# Patient Record
Sex: Female | Born: 1950 | Race: Black or African American | Hispanic: No | Marital: Married | Smoking: Former smoker
Health system: Southern US, Community
[De-identification: ages and names within clinical notes are randomized; demographics above are authoritative.]

## PROBLEM LIST (undated history)

## (undated) DIAGNOSIS — R110 Nausea: Secondary | ICD-10-CM

## (undated) DIAGNOSIS — G629 Polyneuropathy, unspecified: Secondary | ICD-10-CM

## (undated) DIAGNOSIS — K449 Diaphragmatic hernia without obstruction or gangrene: Secondary | ICD-10-CM

## (undated) DIAGNOSIS — C50419 Malignant neoplasm of upper-outer quadrant of unspecified female breast: Secondary | ICD-10-CM

## (undated) DIAGNOSIS — I341 Nonrheumatic mitral (valve) prolapse: Secondary | ICD-10-CM

## (undated) DIAGNOSIS — M5412 Radiculopathy, cervical region: Secondary | ICD-10-CM

## (undated) DIAGNOSIS — C801 Malignant (primary) neoplasm, unspecified: Secondary | ICD-10-CM

## (undated) DIAGNOSIS — E7800 Pure hypercholesterolemia, unspecified: Secondary | ICD-10-CM

## (undated) DIAGNOSIS — E785 Hyperlipidemia, unspecified: Secondary | ICD-10-CM

## (undated) DIAGNOSIS — I10 Essential (primary) hypertension: Secondary | ICD-10-CM

## (undated) DIAGNOSIS — T451X5A Adverse effect of antineoplastic and immunosuppressive drugs, initial encounter: Secondary | ICD-10-CM

## (undated) DIAGNOSIS — J45909 Unspecified asthma, uncomplicated: Secondary | ICD-10-CM

## (undated) DIAGNOSIS — K589 Irritable bowel syndrome without diarrhea: Secondary | ICD-10-CM

## (undated) DIAGNOSIS — R630 Anorexia: Secondary | ICD-10-CM

## (undated) DIAGNOSIS — Z923 Personal history of irradiation: Secondary | ICD-10-CM

## (undated) DIAGNOSIS — Z853 Personal history of malignant neoplasm of breast: Secondary | ICD-10-CM

## (undated) DIAGNOSIS — E119 Type 2 diabetes mellitus without complications: Secondary | ICD-10-CM

## (undated) HISTORY — DX: Malignant (primary) neoplasm, unspecified: C80.1

## (undated) HISTORY — DX: Essential (primary) hypertension: I10

## (undated) HISTORY — DX: Malignant neoplasm of upper-outer quadrant of unspecified female breast: C50.419

## (undated) HISTORY — DX: Diaphragmatic hernia without obstruction or gangrene: K44.9

## (undated) HISTORY — DX: Nausea: R11.0

## (undated) HISTORY — DX: Anorexia: R63.0

## (undated) HISTORY — DX: Nausea: T45.1X5A

## (undated) HISTORY — DX: Personal history of malignant neoplasm of breast: Z85.3

## (undated) HISTORY — PX: TUBAL LIGATION: SHX77

## (undated) HISTORY — DX: Type 2 diabetes mellitus without complications: E11.9

## (undated) HISTORY — DX: Irritable bowel syndrome, unspecified: K58.9

## (undated) MED FILL — Dexamethasone Sodium Phosphate Inj 100 MG/10ML: INTRAMUSCULAR | Qty: 1 | Status: AC

---

## 1998-12-20 HISTORY — PX: ABDOMINAL EXPLORATION SURGERY: SHX538

## 2005-01-25 ENCOUNTER — Ambulatory Visit: Payer: Self-pay | Admitting: Family Medicine

## 2005-04-18 ENCOUNTER — Emergency Department: Payer: Self-pay | Admitting: Emergency Medicine

## 2005-08-04 ENCOUNTER — Ambulatory Visit: Payer: Self-pay | Admitting: Family Medicine

## 2005-08-31 ENCOUNTER — Emergency Department: Payer: Self-pay | Admitting: Emergency Medicine

## 2005-09-06 ENCOUNTER — Emergency Department: Payer: Self-pay | Admitting: Emergency Medicine

## 2006-06-14 ENCOUNTER — Ambulatory Visit: Payer: Self-pay | Admitting: Family Medicine

## 2007-04-25 ENCOUNTER — Ambulatory Visit: Payer: Self-pay | Admitting: Family Medicine

## 2007-05-22 ENCOUNTER — Ambulatory Visit: Payer: Self-pay | Admitting: Family Medicine

## 2007-06-21 ENCOUNTER — Ambulatory Visit: Payer: Self-pay | Admitting: Family Medicine

## 2007-08-28 ENCOUNTER — Emergency Department: Payer: Self-pay | Admitting: Emergency Medicine

## 2008-09-09 ENCOUNTER — Ambulatory Visit: Payer: Self-pay | Admitting: Family Medicine

## 2008-09-13 ENCOUNTER — Ambulatory Visit: Payer: Self-pay | Admitting: Family Medicine

## 2008-12-20 DIAGNOSIS — C801 Malignant (primary) neoplasm, unspecified: Secondary | ICD-10-CM

## 2008-12-20 DIAGNOSIS — Z853 Personal history of malignant neoplasm of breast: Secondary | ICD-10-CM

## 2008-12-20 DIAGNOSIS — Z923 Personal history of irradiation: Secondary | ICD-10-CM

## 2008-12-20 HISTORY — DX: Malignant (primary) neoplasm, unspecified: C80.1

## 2008-12-20 HISTORY — PX: BREAST LUMPECTOMY: SHX2

## 2008-12-20 HISTORY — DX: Personal history of malignant neoplasm of breast: Z85.3

## 2008-12-20 HISTORY — PX: MASTECTOMY: SHX3

## 2008-12-20 HISTORY — PX: BREAST BIOPSY: SHX20

## 2008-12-20 HISTORY — DX: Personal history of irradiation: Z92.3

## 2008-12-20 HISTORY — PX: BREAST EXCISIONAL BIOPSY: SUR124

## 2009-03-07 ENCOUNTER — Ambulatory Visit: Payer: Self-pay | Admitting: General Surgery

## 2009-03-23 DIAGNOSIS — Z853 Personal history of malignant neoplasm of breast: Secondary | ICD-10-CM | POA: Insufficient documentation

## 2009-04-04 ENCOUNTER — Ambulatory Visit: Payer: Self-pay | Admitting: General Surgery

## 2009-04-11 ENCOUNTER — Ambulatory Visit: Payer: Self-pay | Admitting: General Surgery

## 2009-04-11 DIAGNOSIS — C50419 Malignant neoplasm of upper-outer quadrant of unspecified female breast: Secondary | ICD-10-CM

## 2009-04-11 HISTORY — DX: Malignant neoplasm of upper-outer quadrant of unspecified female breast: C50.419

## 2009-04-19 ENCOUNTER — Ambulatory Visit: Payer: Self-pay | Admitting: Internal Medicine

## 2009-04-25 ENCOUNTER — Ambulatory Visit: Payer: Self-pay | Admitting: Internal Medicine

## 2009-05-20 ENCOUNTER — Ambulatory Visit: Payer: Self-pay | Admitting: Internal Medicine

## 2009-06-19 ENCOUNTER — Ambulatory Visit: Payer: Self-pay | Admitting: Internal Medicine

## 2009-07-20 ENCOUNTER — Ambulatory Visit: Payer: Self-pay | Admitting: Internal Medicine

## 2009-07-22 ENCOUNTER — Ambulatory Visit: Payer: Self-pay | Admitting: Internal Medicine

## 2009-08-20 ENCOUNTER — Ambulatory Visit: Payer: Self-pay | Admitting: Internal Medicine

## 2009-09-23 ENCOUNTER — Ambulatory Visit: Payer: Self-pay | Admitting: General Surgery

## 2009-10-20 ENCOUNTER — Ambulatory Visit: Payer: Self-pay | Admitting: Internal Medicine

## 2009-11-07 ENCOUNTER — Ambulatory Visit: Payer: Self-pay | Admitting: Internal Medicine

## 2009-11-19 ENCOUNTER — Ambulatory Visit: Payer: Self-pay | Admitting: Internal Medicine

## 2009-12-20 DIAGNOSIS — E119 Type 2 diabetes mellitus without complications: Secondary | ICD-10-CM

## 2009-12-20 HISTORY — DX: Type 2 diabetes mellitus without complications: E11.9

## 2010-01-20 ENCOUNTER — Ambulatory Visit: Payer: Self-pay | Admitting: Internal Medicine

## 2010-02-13 ENCOUNTER — Ambulatory Visit: Payer: Self-pay | Admitting: Internal Medicine

## 2010-02-17 ENCOUNTER — Ambulatory Visit: Payer: Self-pay | Admitting: Internal Medicine

## 2010-03-25 ENCOUNTER — Ambulatory Visit: Payer: Self-pay | Admitting: General Surgery

## 2010-04-19 ENCOUNTER — Ambulatory Visit: Payer: Self-pay | Admitting: Internal Medicine

## 2010-05-14 ENCOUNTER — Ambulatory Visit: Payer: Self-pay | Admitting: Internal Medicine

## 2010-05-20 ENCOUNTER — Ambulatory Visit: Payer: Self-pay | Admitting: Internal Medicine

## 2010-06-11 ENCOUNTER — Ambulatory Visit: Payer: Self-pay | Admitting: Internal Medicine

## 2010-06-19 ENCOUNTER — Ambulatory Visit: Payer: Self-pay | Admitting: Internal Medicine

## 2010-09-24 ENCOUNTER — Ambulatory Visit: Payer: Self-pay | Admitting: General Surgery

## 2010-10-13 ENCOUNTER — Ambulatory Visit: Payer: Self-pay | Admitting: Internal Medicine

## 2010-10-14 LAB — CANCER ANTIGEN 27.29: CA 27.29: 18.8 U/mL (ref 0.0–38.6)

## 2010-10-20 ENCOUNTER — Ambulatory Visit: Payer: Self-pay | Admitting: Internal Medicine

## 2010-11-19 ENCOUNTER — Ambulatory Visit: Payer: Self-pay | Admitting: Family Medicine

## 2011-02-16 ENCOUNTER — Ambulatory Visit: Payer: Self-pay | Admitting: Internal Medicine

## 2011-02-17 LAB — CANCER ANTIGEN 27.29: CA 27.29: 26.6 U/mL (ref 0.0–38.6)

## 2011-02-18 ENCOUNTER — Ambulatory Visit: Payer: Self-pay | Admitting: Cardiovascular Disease

## 2011-03-24 ENCOUNTER — Ambulatory Visit: Payer: Self-pay | Admitting: General Surgery

## 2011-05-31 ENCOUNTER — Ambulatory Visit: Payer: Self-pay | Admitting: Physical Medicine and Rehabilitation

## 2011-07-15 ENCOUNTER — Ambulatory Visit: Payer: Self-pay | Admitting: Internal Medicine

## 2011-07-21 ENCOUNTER — Ambulatory Visit: Payer: Self-pay | Admitting: Internal Medicine

## 2011-08-24 ENCOUNTER — Ambulatory Visit: Payer: Self-pay | Admitting: Internal Medicine

## 2011-08-25 LAB — CANCER ANTIGEN 27.29: CA 27.29: 19.5 U/mL (ref 0.0–38.6)

## 2011-09-20 ENCOUNTER — Ambulatory Visit: Payer: Self-pay | Admitting: Internal Medicine

## 2011-11-17 ENCOUNTER — Ambulatory Visit: Payer: Self-pay | Admitting: General Surgery

## 2012-02-22 ENCOUNTER — Ambulatory Visit: Payer: Self-pay | Admitting: Oncology

## 2012-02-22 LAB — COMPREHENSIVE METABOLIC PANEL
Alkaline Phosphatase: 132 U/L (ref 50–136)
Anion Gap: 10 (ref 7–16)
Bilirubin,Total: 0.2 mg/dL (ref 0.2–1.0)
Calcium, Total: 8.7 mg/dL (ref 8.5–10.1)
Chloride: 105 mmol/L (ref 98–107)
Co2: 29 mmol/L (ref 21–32)
Creatinine: 1.18 mg/dL (ref 0.60–1.30)
EGFR (Non-African Amer.): 50 — ABNORMAL LOW
Osmolality: 287 (ref 275–301)
SGPT (ALT): 27 U/L
Sodium: 144 mmol/L (ref 136–145)
Total Protein: 7.6 g/dL (ref 6.4–8.2)

## 2012-02-22 LAB — CBC CANCER CENTER
Basophil #: 0 x10 3/mm (ref 0.0–0.1)
Eosinophil %: 2.7 %
HGB: 10.7 g/dL — ABNORMAL LOW (ref 12.0–16.0)
Lymphocyte #: 1.9 x10 3/mm (ref 1.0–3.6)
Lymphocyte %: 25.4 %
MCH: 26.9 pg (ref 26.0–34.0)
MCV: 82 fL (ref 80–100)
Monocyte %: 5.3 %
Neutrophil %: 66.1 %
Platelet: 321 x10 3/mm (ref 150–440)
RBC: 3.96 10*6/uL (ref 3.80–5.20)
RDW: 15.6 % — ABNORMAL HIGH (ref 11.5–14.5)
WBC: 7.4 x10 3/mm (ref 3.6–11.0)

## 2012-03-20 ENCOUNTER — Ambulatory Visit: Payer: Self-pay | Admitting: Oncology

## 2012-06-15 ENCOUNTER — Ambulatory Visit: Payer: Self-pay | Admitting: General Surgery

## 2012-06-19 LAB — HM COLONOSCOPY: HM Colonoscopy: NORMAL

## 2012-07-19 ENCOUNTER — Ambulatory Visit: Payer: Self-pay | Admitting: General Surgery

## 2012-07-19 HISTORY — PX: COLONOSCOPY: SHX174

## 2012-07-20 ENCOUNTER — Ambulatory Visit: Payer: Self-pay | Admitting: Oncology

## 2012-08-20 ENCOUNTER — Ambulatory Visit: Payer: Self-pay | Admitting: Oncology

## 2012-08-31 LAB — CBC CANCER CENTER
Basophil #: 0 x10 3/mm (ref 0.0–0.1)
Eosinophil #: 0.1 x10 3/mm (ref 0.0–0.7)
HGB: 11.5 g/dL — ABNORMAL LOW (ref 12.0–16.0)
Lymphocyte %: 28.4 %
MCH: 26.5 pg (ref 26.0–34.0)
MCHC: 31.1 g/dL — ABNORMAL LOW (ref 32.0–36.0)
MCV: 85 fL (ref 80–100)
Monocyte #: 0.3 x10 3/mm (ref 0.2–0.9)
Neutrophil #: 3.5 x10 3/mm (ref 1.4–6.5)
Neutrophil %: 63.4 %
Platelet: 227 x10 3/mm (ref 150–440)
RBC: 4.35 10*6/uL (ref 3.80–5.20)
RDW: 15.3 % — ABNORMAL HIGH (ref 11.5–14.5)

## 2012-08-31 LAB — COMPREHENSIVE METABOLIC PANEL
Albumin: 3.1 g/dL — ABNORMAL LOW (ref 3.4–5.0)
Alkaline Phosphatase: 129 U/L (ref 50–136)
Anion Gap: 3 — ABNORMAL LOW (ref 7–16)
Bilirubin,Total: 0.2 mg/dL (ref 0.2–1.0)
Calcium, Total: 8.8 mg/dL (ref 8.5–10.1)
Co2: 34 mmol/L — ABNORMAL HIGH (ref 21–32)
Creatinine: 0.96 mg/dL (ref 0.60–1.30)
EGFR (African American): >60
Glucose: 107 mg/dL — ABNORMAL HIGH (ref 65–99)
Osmolality: 286 (ref 275–301)
SGPT (ALT): 30 U/L (ref 12–78)

## 2012-09-19 ENCOUNTER — Ambulatory Visit: Payer: Self-pay | Admitting: Oncology

## 2012-11-08 ENCOUNTER — Ambulatory Visit: Payer: Self-pay | Admitting: Oncology

## 2012-12-05 ENCOUNTER — Ambulatory Visit: Payer: Self-pay | Admitting: Oncology

## 2013-02-17 ENCOUNTER — Ambulatory Visit: Payer: Self-pay | Admitting: Oncology

## 2013-03-13 LAB — CBC CANCER CENTER
Basophil #: 0.1 x10 3/mm (ref 0.0–0.1)
Basophil %: 1.3 %
Eosinophil #: 0.2 x10 3/mm (ref 0.0–0.7)
HCT: 37.7 % (ref 35.0–47.0)
Lymphocyte #: 1.5 x10 3/mm (ref 1.0–3.6)
MCV: 84 fL (ref 80–100)
Monocyte #: 0.4 x10 3/mm (ref 0.2–0.9)
Monocyte %: 6.7 %
RBC: 4.46 10*6/uL (ref 3.80–5.20)
RDW: 14.1 % (ref 11.5–14.5)
WBC: 5.7 x10 3/mm (ref 3.6–11.0)

## 2013-03-13 LAB — COMPREHENSIVE METABOLIC PANEL
Albumin: 3.1 g/dL — ABNORMAL LOW (ref 3.4–5.0)
Alkaline Phosphatase: 108 U/L (ref 50–136)
BUN: 15 mg/dL (ref 7–18)
Bilirubin,Total: 0.2 mg/dL (ref 0.2–1.0)
Calcium, Total: 8.8 mg/dL (ref 8.5–10.1)
Co2: 30 mmol/L (ref 21–32)
EGFR (African American): 58 — ABNORMAL LOW
EGFR (Non-African Amer.): 50 — ABNORMAL LOW
Osmolality: 288 (ref 275–301)
SGOT(AST): 19 U/L (ref 15–37)
SGPT (ALT): 22 U/L (ref 12–78)
Sodium: 143 mmol/L (ref 136–145)
Total Protein: 7.1 g/dL (ref 6.4–8.2)

## 2013-03-14 LAB — CANCER ANTIGEN 27.29: CA 27.29: 20.4 U/mL (ref 0.0–38.6)

## 2013-03-20 ENCOUNTER — Ambulatory Visit: Payer: Self-pay | Admitting: Oncology

## 2013-06-06 ENCOUNTER — Ambulatory Visit: Payer: Self-pay | Admitting: Family Medicine

## 2013-06-11 ENCOUNTER — Encounter: Payer: Self-pay | Admitting: Family Medicine

## 2013-06-19 ENCOUNTER — Encounter: Payer: Self-pay | Admitting: Family Medicine

## 2013-07-11 ENCOUNTER — Ambulatory Visit: Payer: Self-pay | Admitting: Oncology

## 2013-07-20 ENCOUNTER — Ambulatory Visit: Payer: Self-pay | Admitting: Oncology

## 2013-08-20 LAB — HM MAMMOGRAPHY: HM MAMMO: NORMAL (ref 0–4)

## 2013-09-12 ENCOUNTER — Ambulatory Visit: Payer: Self-pay | Admitting: Oncology

## 2013-09-13 LAB — CBC CANCER CENTER
Eosinophil #: 0.2 x10 3/mm (ref 0.0–0.7)
Eosinophil %: 3.2 %
HCT: 38.9 % (ref 35.0–47.0)
HGB: 12.7 g/dL (ref 12.0–16.0)
Lymphocyte %: 23.1 %
Monocyte %: 5.9 %
Neutrophil #: 4.2 x10 3/mm (ref 1.4–6.5)
Neutrophil %: 67 %
Platelet: 236 x10 3/mm (ref 150–440)
RDW: 14.5 % (ref 11.5–14.5)

## 2013-09-13 LAB — COMPREHENSIVE METABOLIC PANEL
Alkaline Phosphatase: 112 U/L (ref 50–136)
Anion Gap: 1 — ABNORMAL LOW (ref 7–16)
BUN: 10 mg/dL (ref 7–18)
Bilirubin,Total: 0.3 mg/dL (ref 0.2–1.0)
Calcium, Total: 9.1 mg/dL (ref 8.5–10.1)
Chloride: 106 mmol/L (ref 98–107)
Creatinine: 0.91 mg/dL (ref 0.60–1.30)
EGFR (African American): >60
EGFR (Non-African Amer.): >60
Osmolality: 281 (ref 275–301)
Sodium: 140 mmol/L (ref 136–145)

## 2013-09-19 ENCOUNTER — Ambulatory Visit: Payer: Self-pay | Admitting: Oncology

## 2013-12-19 ENCOUNTER — Ambulatory Visit: Payer: Self-pay | Admitting: General Surgery

## 2013-12-24 ENCOUNTER — Encounter: Payer: Self-pay | Admitting: General Surgery

## 2014-01-07 ENCOUNTER — Encounter: Payer: Self-pay | Admitting: General Surgery

## 2014-01-07 ENCOUNTER — Ambulatory Visit (INDEPENDENT_AMBULATORY_CARE_PROVIDER_SITE_OTHER): Payer: 59 | Admitting: General Surgery

## 2014-01-07 VITALS — BP 120/90 | HR 66 | Resp 14 | Ht 66.0 in | Wt 213.0 lb

## 2014-01-07 DIAGNOSIS — Z1239 Encounter for other screening for malignant neoplasm of breast: Secondary | ICD-10-CM

## 2014-01-07 DIAGNOSIS — C50919 Malignant neoplasm of unspecified site of unspecified female breast: Secondary | ICD-10-CM

## 2014-01-07 NOTE — Progress Notes (Signed)
Patient ID: Morgan Peterson, female   DOB: 1951/10/31, 63 y.o.   MRN: [id number]  Chief Complaint  Patient presents with  . Follow-up    mammogram    HPI Morgan Peterson is a 63 y.o. female who presents for Reassessment of her previously treated right breast cancer The most recent mammogram was done on 12/19/13. Patient does perform regular self breast checks and gets regular mammograms done. The patient denies any problems at this time.   HPI  Past Medical History  Diagnosis Date  . Diabetes mellitus without complication 9476  . Hypertension   . Personal history of malignant neoplasm of breast 2010  . IBS (irritable bowel syndrome)   . Cancer 2010    Right Breast  . 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 Abdominal exploration surgery  2000  . Colonoscopy  2003, 2013    Dr Alveta Heimlich, Dr. Bary Castilla  . Tubal ligation  20 years ago  . Breast lumpectomy Right 2010  . Breast biopsy Right 2010    Family History  Problem Relation Age of Onset  . Colon cancer Maternal Uncle   . Breast cancer Maternal Aunt     Social History History  Substance Use Topics  . Smoking status: Former Smoker -- 0.25 packs/day for 10 years    Types: Cigarettes  . Smokeless tobacco: Never Used  . Alcohol Use: Yes    Allergies  Allergen Reactions  . Codeine   . Levaquin [Levofloxacin In D5w] Swelling  . Prednisolone Swelling    Current Outpatient Prescriptions  Medication Sig Dispense Refill  . anastrozole (ARIMIDEX) 1 MG tablet Take 1 tablet by mouth daily.      Marland Kitchen BYSTOLIC 10 MG tablet Take 1 tablet by mouth daily.      . calcium carbonate 200 MG capsule Take 250 mg by mouth 2 (two) times daily with a meal.      . cholecalciferol (VITAMIN D) 1000 UNITS tablet Take 1,000  Units by mouth daily.      Marland Kitchen ibuprofen (ADVIL,MOTRIN) 200 MG tablet Take 200 mg by mouth every 6 (six) hours as needed.      . ONGLYZA 5 MG TABS tablet Take 1 tablet by mouth daily.      Marland Kitchen triamterene-hydrochlorothiazide (MAXZIDE-25) 37.5-25 MG per tablet Take 1 tablet by mouth daily.       No current facility-administered medications for this visit.    Review of Systems Review of Systems  Constitutional: Negative.   Respiratory: Negative.   Cardiovascular: Negative.     Blood pressure 120/90, pulse 66, resp. rate 14, height '5\' 6"'  (1.676 m), weight 213 lb (96.616 kg).  Physical Exam Physical Exam  Constitutional: She is oriented to person, place, and time. She appears well-developed and well-nourished.  Neck: Neck supple. No thyromegaly present.  Cardiovascular: Normal rate, regular rhythm and normal heart sounds.   No murmur heard. Pulmonary/Chest: Effort normal and breath sounds normal. Right breast exhibits no inverted nipple, no mass, no nipple discharge, no skin change and no tenderness. Left breast exhibits no inverted nipple, no mass, no nipple discharge, no skin change and no tenderness.    Thickening in the right axilla.  Thickening at the balloon site of right breast.   Lymphadenopathy:  She has no cervical adenopathy.    She has no axillary adenopathy.  Neurological: She is alert and oriented to person, place, and time.  Skin: Skin is warm and dry.    Data Reviewed Bilateral mammogram dated December 19, 2013 were reviewed and compared 2013 2012 studies. Stable calcifications in response to radiation and surgery. No interval change. BI-RAD-2.  Assessment    Doing well now almost 5 years after treatment of her T1b carcinoma right breast.     Plan    The patient will continue on Arimidex to complete 5 years of treatment. Follow up exam and bilateral diagnostic mammograms in one year        Robert Bellow 01/07/2014, 9:11 PM

## 2014-01-07 NOTE — Patient Instructions (Signed)
Patient to return in 1 year with bilateral diagnostic mammogram. Patient to call our office with any new questions or concerns.

## 2014-01-22 ENCOUNTER — Encounter: Payer: Self-pay | Admitting: General Surgery

## 2014-03-13 ENCOUNTER — Ambulatory Visit: Payer: Self-pay | Admitting: Oncology

## 2014-03-14 LAB — CBC CANCER CENTER
Basophil #: 0 x10 3/mm (ref 0.0–0.1)
Basophil %: 0.2 %
Eosinophil #: 0.1 x10 3/mm (ref 0.0–0.7)
Eosinophil %: 0.9 %
HCT: 38.8 % (ref 35.0–47.0)
HGB: 12.3 g/dL (ref 12.0–16.0)
LYMPHS ABS: 1.6 x10 3/mm (ref 1.0–3.6)
LYMPHS PCT: 16.5 %
MCH: 27 pg (ref 26.0–34.0)
MCHC: 31.8 g/dL — ABNORMAL LOW (ref 32.0–36.0)
MCV: 85 fL (ref 80–100)
Monocyte #: 0.4 x10 3/mm (ref 0.2–0.9)
Monocyte %: 4.7 %
Neutrophil #: 7.4 x10 3/mm — ABNORMAL HIGH (ref 1.4–6.5)
Neutrophil %: 77.7 %
PLATELETS: 212 x10 3/mm (ref 150–440)
RBC: 4.57 10*6/uL (ref 3.80–5.20)
RDW: 15 % — AB (ref 11.5–14.5)
WBC: 9.5 x10 3/mm (ref 3.6–11.0)

## 2014-03-14 LAB — COMPREHENSIVE METABOLIC PANEL
ALBUMIN: 3 g/dL — AB (ref 3.4–5.0)
ALK PHOS: 106 U/L
ANION GAP: 8 (ref 7–16)
BUN: 15 mg/dL (ref 7–18)
Bilirubin,Total: 0.4 mg/dL (ref 0.2–1.0)
CALCIUM: 8.8 mg/dL (ref 8.5–10.1)
CREATININE: 0.85 mg/dL (ref 0.60–1.30)
Chloride: 106 mmol/L (ref 98–107)
Co2: 28 mmol/L (ref 21–32)
Glucose: 146 mg/dL — ABNORMAL HIGH (ref 65–99)
Osmolality: 287 (ref 275–301)
Potassium: 3.7 mmol/L (ref 3.5–5.1)
SGOT(AST): 16 U/L (ref 15–37)
SGPT (ALT): 30 U/L (ref 12–78)
Sodium: 142 mmol/L (ref 136–145)
TOTAL PROTEIN: 6.7 g/dL (ref 6.4–8.2)

## 2014-03-15 LAB — CANCER ANTIGEN 27.29: CA 27.29: 21.3 U/mL (ref 0.0–38.6)

## 2014-03-20 ENCOUNTER — Ambulatory Visit: Payer: Self-pay | Admitting: Oncology

## 2014-05-31 ENCOUNTER — Emergency Department: Payer: Self-pay | Admitting: Emergency Medicine

## 2014-06-11 ENCOUNTER — Ambulatory Visit: Payer: Self-pay | Admitting: Family Medicine

## 2014-06-19 ENCOUNTER — Ambulatory Visit: Payer: Self-pay | Admitting: Family Medicine

## 2014-07-20 ENCOUNTER — Ambulatory Visit: Payer: Self-pay | Admitting: Family Medicine

## 2014-07-25 LAB — LIPID PANEL
CHOLESTEROL: 170 mg/dL (ref 0–200)
HDL: 50 mg/dL (ref 35–70)
LDL CALC: 102 mg/dL

## 2014-09-04 ENCOUNTER — Ambulatory Visit: Payer: Self-pay | Admitting: Family Medicine

## 2014-09-12 ENCOUNTER — Ambulatory Visit: Payer: Self-pay | Admitting: Oncology

## 2014-09-12 LAB — CBC CANCER CENTER
Basophil #: 0 x10 3/mm (ref 0.0–0.1)
Basophil %: 0.5 %
EOS ABS: 0.1 x10 3/mm (ref 0.0–0.7)
EOS PCT: 1.8 %
HCT: 40.5 % (ref 35.0–47.0)
HGB: 12.8 g/dL (ref 12.0–16.0)
LYMPHS PCT: 25.6 %
Lymphocyte #: 1.6 x10 3/mm (ref 1.0–3.6)
MCH: 27.4 pg (ref 26.0–34.0)
MCHC: 31.7 g/dL — AB (ref 32.0–36.0)
MCV: 87 fL (ref 80–100)
MONO ABS: 0.4 x10 3/mm (ref 0.2–0.9)
Monocyte %: 6.9 %
Neutrophil #: 4.1 x10 3/mm (ref 1.4–6.5)
Neutrophil %: 65.2 %
Platelet: 215 x10 3/mm (ref 150–440)
RBC: 4.69 10*6/uL (ref 3.80–5.20)
RDW: 14.4 % (ref 11.5–14.5)
WBC: 6.2 x10 3/mm (ref 3.6–11.0)

## 2014-09-12 LAB — COMPREHENSIVE METABOLIC PANEL
AST: 15 U/L (ref 15–37)
Albumin: 2.9 g/dL — ABNORMAL LOW (ref 3.4–5.0)
Alkaline Phosphatase: 101 U/L
Anion Gap: 6 — ABNORMAL LOW (ref 7–16)
BUN: 15 mg/dL (ref 7–18)
Bilirubin,Total: 0.3 mg/dL (ref 0.2–1.0)
CHLORIDE: 101 mmol/L (ref 98–107)
Calcium, Total: 9.2 mg/dL (ref 8.5–10.1)
Co2: 32 mmol/L (ref 21–32)
Creatinine: 1.01 mg/dL (ref 0.60–1.30)
EGFR (African American): >60
GFR CALC NON AF AMER: 59 — AB
GLUCOSE: 104 mg/dL — AB (ref 65–99)
Osmolality: 279 (ref 275–301)
Potassium: 4 mmol/L (ref 3.5–5.1)
SGPT (ALT): 21 U/L
Sodium: 139 mmol/L (ref 136–145)
Total Protein: 6.8 g/dL (ref 6.4–8.2)

## 2014-09-19 ENCOUNTER — Ambulatory Visit: Payer: Self-pay | Admitting: Oncology

## 2014-10-21 ENCOUNTER — Encounter: Payer: Self-pay | Admitting: General Surgery

## 2014-12-24 ENCOUNTER — Encounter: Payer: Self-pay | Admitting: General Surgery

## 2015-01-02 ENCOUNTER — Encounter: Payer: Self-pay | Admitting: General Surgery

## 2015-01-02 ENCOUNTER — Ambulatory Visit (INDEPENDENT_AMBULATORY_CARE_PROVIDER_SITE_OTHER): Payer: 59 | Admitting: General Surgery

## 2015-01-02 VITALS — BP 136/70 | HR 66 | Resp 12 | Ht 66.0 in | Wt 203.0 lb

## 2015-01-02 DIAGNOSIS — C50911 Malignant neoplasm of unspecified site of right female breast: Secondary | ICD-10-CM

## 2015-01-02 NOTE — Progress Notes (Signed)
Patient ID: Morgan Peterson, female   DOB: Apr 26, 1951, 64 y.o.   MRN: [id number]  Chief Complaint  Patient presents with  . Follow-up    mammogram    HPI Morgan Peterson is a 64 y.o. female. who presents for a breast evaluation. The most recent mammogram was done on 12/26/14.  Patient does perform regular self breast checks and gets regular mammograms done.   No new breast issues. She is followed by Dr. Oliva Bustard and he took her off of the Arimidex in September 2015.    HPI  Past Medical History  Diagnosis Date  . Diabetes mellitus without complication 4917  . Hypertension   . Personal history of malignant neoplasm of breast 2010  . IBS (irritable bowel syndrome)   . Cancer 2010    Right Breast  . 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 Arimidex therapy completed September 2015    Past Surgical History  Procedure Laterality Date  . Abdominal exploration surgery  2000  . Colonoscopy  2003, 2013    Dr Alveta Heimlich, Dr. Bary Castilla  . Tubal ligation  20 years ago  . Breast lumpectomy Right 2010  . Breast biopsy Right 2010    Family History  Problem Relation Age of Onset  . Colon cancer Maternal Uncle   . Breast cancer Maternal Aunt     Social History History  Substance Use Topics  . Smoking status: Former Smoker -- 0.25 packs/day for 10 years    Types: Cigarettes  . Smokeless tobacco: Never Used  . Alcohol Use: Yes    Allergies  Allergen Reactions  . Codeine   . Levaquin [Levofloxacin In D5w] Swelling  . Prednisolone Swelling    Current Outpatient Prescriptions  Medication Sig Dispense Refill  . BYSTOLIC 10 MG tablet Take 1 tablet by mouth daily.    . calcium carbonate 200 MG capsule Take 250 mg by mouth 2 (two) times daily with a meal.    . cholecalciferol (VITAMIN D) 1000 UNITS tablet Take 1,000  Units by mouth daily.    Marland Kitchen ibuprofen (ADVIL,MOTRIN) 200 MG tablet Take 200 mg by mouth every 6 (six) hours as needed.    . lovastatin (MEVACOR) 40 MG tablet Take 40 mg by mouth at bedtime.    . triamterene-hydrochlorothiazide (MAXZIDE-25) 37.5-25 MG per tablet Take 1 tablet by mouth daily.     No current facility-administered medications for this visit.    Review of Systems Review of Systems  Constitutional: Negative.   Respiratory: Negative.   Cardiovascular: Negative.     Blood pressure 136/70, pulse 66, resp. rate 12, height _0  (1.676 m), weight 203 lb (92.08 kg).  Physical Exam Physical Exam  Constitutional: She is oriented to person, place, and time. She appears well-developed and well-nourished.  Eyes: Conjunctivae are normal.  Neck: Neck supple.  Cardiovascular: Normal rate and regular rhythm.   Murmur heard.  Systolic murmur is present with a grade of 2/6  Pulmonary/Chest: Effort normal and breath sounds normal. Right breast exhibits no inverted nipple, no mass, no nipple discharge, no skin change and no tenderness. Left breast exhibits no inverted nipple, no mass, no nipple discharge, no skin change and no tenderness.    Mild thickening between two incisions right breast.  Lymphadenopathy:    She has no cervical adenopathy.  She has no axillary adenopathy.  Neurological: She is alert and oriented to person, place, and time.  Skin: Skin is warm and dry.    Data Reviewed Bilateral diagnostic mammograms dated 12/24/2014 completed at [facility] showed no interval change. BI-RADS-2.  Assessment    Benign breast exam.     Plan    Follow-up examination in one year.      The patient has been asked to return to the office in one year with a bilateral diagnostic mammogram.  PCP:  Ashok Norris Dr. Zenia Resides, [place] Gleason 01/03/2015, 4:55 PM

## 2015-01-02 NOTE — Patient Instructions (Addendum)
Continue self breast exams. Call office for any new breast issues or concerns. The patient has been asked to return to the office in one year with a bilateral diagnostic mammogram.

## 2015-01-03 ENCOUNTER — Encounter: Payer: Self-pay | Admitting: General Surgery

## 2015-03-24 ENCOUNTER — Ambulatory Visit
Admit: 2015-03-24 | Disposition: A | Discharge status: Home / Self Care | Payer: Self-pay | Attending: Oncology | Admitting: Oncology

## 2015-03-24 LAB — CBC CANCER CENTER
BASOS ABS: 0 x10 3/mm (ref 0.0–0.1)
BASOS PCT: 0.7 %
Eosinophil #: 0.2 x10 3/mm (ref 0.0–0.7)
Eosinophil %: 2.4 %
HCT: 38.6 % (ref 35.0–47.0)
HGB: 12.4 g/dL (ref 12.0–16.0)
LYMPHS ABS: 2 x10 3/mm (ref 1.0–3.6)
Lymphocyte %: 31.5 %
MCH: 27.7 pg (ref 26.0–34.0)
MCHC: 32.1 g/dL (ref 32.0–36.0)
MCV: 86 fL (ref 80–100)
Monocyte #: 0.3 x10 3/mm (ref 0.2–0.9)
Monocyte %: 5.3 %
NEUTROS ABS: 3.8 x10 3/mm (ref 1.4–6.5)
Neutrophil %: 60.1 %
Platelet: 250 x10 3/mm (ref 150–440)
RBC: 4.47 10*6/uL (ref 3.80–5.20)
RDW: 13.7 % (ref 11.5–14.5)
WBC: 6.3 x10 3/mm (ref 3.6–11.0)

## 2015-03-24 LAB — COMPREHENSIVE METABOLIC PANEL
Albumin: 3.6 g/dL
Alkaline Phosphatase: 112 U/L
Anion Gap: 5 — ABNORMAL LOW (ref 7–16)
BILIRUBIN TOTAL: 0.3 mg/dL
BUN: 11 mg/dL
CHLORIDE: 101 mmol/L
Calcium, Total: 8.7 mg/dL — ABNORMAL LOW
Co2: 31 mmol/L
Creatinine: 0.8 mg/dL
GLUCOSE: 162 mg/dL — AB
POTASSIUM: 3.5 mmol/L
SGOT(AST): 19 U/L
SGPT (ALT): 16 U/L
SODIUM: 137 mmol/L
TOTAL PROTEIN: 7.2 g/dL

## 2015-03-25 LAB — CANCER ANTIGEN 27.29: CA 27.29: 20 U/mL (ref 0.0–38.6)

## 2015-03-29 ENCOUNTER — Emergency Department
Admit: 2015-03-29 | Disposition: A | Discharge status: Home / Self Care | Payer: Self-pay | Admitting: Physician Assistant

## 2015-06-17 ENCOUNTER — Other Ambulatory Visit: Payer: Self-pay | Admitting: Family Medicine

## 2015-06-30 ENCOUNTER — Encounter (INDEPENDENT_AMBULATORY_CARE_PROVIDER_SITE_OTHER): Payer: Self-pay

## 2015-06-30 ENCOUNTER — Encounter: Payer: Self-pay | Admitting: Family Medicine

## 2015-06-30 ENCOUNTER — Ambulatory Visit (INDEPENDENT_AMBULATORY_CARE_PROVIDER_SITE_OTHER): Payer: Self-pay | Admitting: Family Medicine

## 2015-06-30 VITALS — BP 136/78 | HR 62 | Temp 98.5°F | Resp 16 | Ht 64.0 in | Wt 206.5 lb

## 2015-06-30 DIAGNOSIS — E785 Hyperlipidemia, unspecified: Secondary | ICD-10-CM

## 2015-06-30 DIAGNOSIS — E119 Type 2 diabetes mellitus without complications: Secondary | ICD-10-CM

## 2015-06-30 DIAGNOSIS — M5090 Cervical disc disorder, unspecified, unspecified cervical region: Secondary | ICD-10-CM

## 2015-06-30 DIAGNOSIS — M25511 Pain in right shoulder: Secondary | ICD-10-CM

## 2015-06-30 DIAGNOSIS — I10 Essential (primary) hypertension: Secondary | ICD-10-CM | POA: Insufficient documentation

## 2015-06-30 DIAGNOSIS — Z794 Long term (current) use of insulin: Secondary | ICD-10-CM

## 2015-06-30 DIAGNOSIS — E1165 Type 2 diabetes mellitus with hyperglycemia: Secondary | ICD-10-CM | POA: Insufficient documentation

## 2015-06-30 DIAGNOSIS — E1169 Type 2 diabetes mellitus with other specified complication: Secondary | ICD-10-CM | POA: Insufficient documentation

## 2015-06-30 LAB — POCT GLYCOSYLATED HEMOGLOBIN (HGB A1C): HEMOGLOBIN A1C: 6.9

## 2015-06-30 LAB — POCT UA - MICROALBUMIN: Microalbumin Ur, POC: 20 mg/L

## 2015-06-30 LAB — GLUCOSE, POCT (MANUAL RESULT ENTRY): POC Glucose: 148 mg/dl — AB (ref 70–99)

## 2015-06-30 MED ORDER — GABAPENTIN 300 MG PO CAPS: 300.0000 mg | ORAL_CAPSULE | Freq: Three times a day (TID) | ORAL | Status: DC

## 2015-06-30 MED ORDER — MELOXICAM 15 MG PO TABS: 15.0000 mg | ORAL_TABLET | Freq: Every day | ORAL | Status: DC

## 2015-06-30 NOTE — Progress Notes (Signed)
Name: Morgan Peterson   MRN: [id number]    DOB: 10-Nov-1951   Date:06/30/2015       Progress Note  Subjective  Chief Complaint  Chief Complaint  Patient presents with  . Hypertension  . Diabetes  . Hyperlipidemia  . Shoulder Pain    Hypertension This is a chronic problem. The current episode started more than 1 year ago. The problem is unchanged. The problem is controlled. Associated symptoms include neck pain. Pertinent negatives include no blurred vision, chest pain, headaches, orthopnea, palpitations or shortness of breath. There are no associated agents to hypertension. Risk factors for coronary artery disease include diabetes mellitus, dyslipidemia, obesity, post-menopausal state, sedentary lifestyle, smoking/tobacco exposure and stress. Past treatments include calcium channel blockers, beta blockers and diuretics. The current treatment provides moderate improvement.  Diabetes She presents for her follow-up diabetic visit. She has type 2 diabetes mellitus. Her disease course has been worsening. There are no hypoglycemic associated symptoms. Pertinent negatives for hypoglycemia include no dizziness, headaches, nervousness/anxiousness, seizures or tremors. Pertinent negatives for diabetes include no blurred vision, no chest pain, no weakness and no weight loss. There are no hypoglycemic complications. Symptoms are worsening. There are no diabetic complications. Current diabetic treatment includes diet. She is compliant with treatment some of the time. Her weight is stable. She is following a diabetic diet. She has had a previous visit with a dietitian. She rarely participates in exercise. Her overall blood glucose range is 140-180 mg/dl. An ACE inhibitor/angiotensin II receptor blocker is contraindicated. She does not see a podiatrist.Eye exam is current.  Hyperlipidemia This is a chronic problem. The current episode started more than 1 year ago. The problem is uncontrolled. Recent lipid tests  were reviewed and are high. Exacerbating diseases include diabetes and obesity. Factors aggravating her hyperlipidemia include fatty foods and thiazides. Pertinent negatives include no chest pain, focal weakness, myalgias or shortness of breath. Current antihyperlipidemic treatment includes statins. The current treatment provides moderate improvement of lipids. There are no compliance problems.  Risk factors for coronary artery disease include diabetes mellitus, dyslipidemia, hypertension, obesity, post-menopausal, a sedentary lifestyle and stress.  Shoulder Pain  Associated symptoms include numbness and tingling. Pertinent negatives include no fever or itching. Her past medical history is significant for diabetes.  Neck Pain  This is a chronic problem. The current episode started more than 1 month ago. The problem occurs constantly. The problem has been gradually worsening. The pain is associated with lifting a heavy object. The pain is present in the right side. The quality of the pain is described as shooting and stabbing. The pain is moderate. The symptoms are aggravated by coughing, sneezing, stress, twisting and position. The pain is worse during the night. Associated symptoms include numbness and tingling. Pertinent negatives include no chest pain, fever, headaches, weakness or weight loss. She has tried acetaminophen, home exercises, NSAIDs, oral narcotics and muscle relaxants for the symptoms. The treatment provided mild relief.      Past Medical History  Diagnosis Date  . Diabetes mellitus without complication 6381  . Hypertension   . Personal history of malignant neoplasm of breast 2010  . IBS (irritable bowel syndrome)   . Cancer 2010    Right Breast  . 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 Arimidex therapy  completed September 2015  History  Substance Use Topics  . Smoking status: Former Smoker -- 0.25 packs/day for 10 years    Types: Cigarettes  . Smokeless tobacco: Never Used  . Alcohol Use: 0.0 oz/week    0 Standard drinks or equivalent per week     Current outpatient prescriptions:  .  BYSTOLIC 10 MG tablet, Take 1 tablet by mouth  daily, Disp: 90 tablet, Rfl: 0 .  calcium carbonate 200 MG capsule, Take 250 mg by mouth 2 (two) times daily with a meal., Disp: , Rfl:  .  cholecalciferol (VITAMIN D) 1000 UNITS tablet, Take 1,000 Units by mouth daily., Disp: , Rfl:  .  ibuprofen (ADVIL,MOTRIN) 200 MG tablet, Take 200 mg by mouth every 6 (six) hours as needed., Disp: , Rfl:  .  lovastatin (MEVACOR) 40 MG tablet, Take 40 mg by mouth at bedtime., Disp: , Rfl:  .  triamterene-hydrochlorothiazide (MAXZIDE-25) 37.5-25 MG per tablet, Take 1 tablet by mouth daily., Disp: , Rfl:  .  cyclobenzaprine (FLEXERIL) 5 MG tablet, 1-2 TABLET, ORAL, EVERY EIGHT HOURS, AS NEEDED, Disp: , Rfl: 1 .  ibuprofen (ADVIL,MOTRIN) 800 MG tablet, Take 800 mg by mouth 3 (three) times daily., Disp: , Rfl: 0  Allergies  Allergen Reactions  . Actos [Pioglitazone] Other (See Comments)    Bladder pain  . Codeine   . Invokana [Canagliflozin] Other (See Comments)    Bladder pain  . Levaquin [Levofloxacin In D5w] Swelling  . Lisinopril Swelling    Angioedema  . Onglyza [Saxagliptin] Other (See Comments)    Increased PVC's  . Prednisolone Swelling  . Tramadol Nausea And Vomiting    Review of Systems  Constitutional: Negative for fever, chills and weight loss.  HENT: Negative for congestion, hearing loss, sore throat and tinnitus.   Eyes: Negative for blurred vision, double vision and redness.  Respiratory: Negative for cough, hemoptysis and shortness of breath.   Cardiovascular: Negative for chest pain, palpitations, orthopnea, claudication and leg swelling.  Gastrointestinal: Negative for heartburn, nausea,  vomiting, diarrhea, constipation and blood in stool.  Genitourinary: Negative for dysuria, urgency, frequency and hematuria.  Musculoskeletal: Positive for neck pain. Negative for myalgias, back pain, joint pain and falls.  Skin: Negative for itching.  Neurological: Positive for tingling and numbness. Negative for dizziness, tremors, focal weakness, seizures, loss of consciousness, weakness and headaches.  Endo/Heme/Allergies: Does not bruise/bleed easily.  Psychiatric/Behavioral: Negative for depression and substance abuse. The patient is not nervous/anxious and does not have insomnia.      Objective  Filed Vitals:   06/30/15 1056  BP: 136/78  Pulse: 62  Temp: 98.5 F (36.9 C)  TempSrc: Oral  Resp: 16  Height: _0  (1.626 m)  Weight: 206 lb 8 oz (93.668 kg)  SpO2: 96%     Physical Exam  Constitutional: She is oriented to person, place, and time and well-developed, well-nourished, and in no distress.  HENT:  Head: Normocephalic.  Eyes: EOM are normal. Pupils are equal, round, and reactive to light.  Neck: Normal range of motion. No thyromegaly present.  Cardiovascular: Normal rate, regular rhythm and normal heart sounds.   No murmur heard. Pulmonary/Chest: Effort normal and breath sounds normal.  Abdominal: Soft. Bowel sounds are normal.  Genitourinary: Vaginal discharge found.  Musculoskeletal: She exhibits tenderness (tender in the right sternomastoid and trapezius and area medial to the scapular. There is limitation of rotation of the C-spine to about 60.). She exhibits no edema.  Neurological: She is alert and oriented to person, place,  and time. No cranial nerve deficit. Gait normal.  Skin: Skin is warm and dry. No rash noted.  Psychiatric: Memory and affect normal.      Assessment & Plan   1. Type 2 diabetes mellitus without complication  - POCT HgB A1C - POCT Glucose (CBG) - POCT UA - Microalbumin  2. Cervical disc disease - gabapentin (NEURONTIN) 300  MG capsule; Take 1 capsule (300 mg total) by mouth 3 (three) times daily.  Dispense: 90 capsule; Refill: 3 - meloxicam (MOBIC) 15 MG tablet; Take 1 tablet (15 mg total) by mouth daily.  Dispense: 30 tablet; Refill: 1     3. Essential hypertension Well controlled    5. Hyperlipidemia Sl elevated

## 2015-06-30 NOTE — Patient Instructions (Signed)

## 2015-07-31 ENCOUNTER — Encounter: Payer: Self-pay | Admitting: Family Medicine

## 2015-07-31 ENCOUNTER — Ambulatory Visit (INDEPENDENT_AMBULATORY_CARE_PROVIDER_SITE_OTHER): Payer: 59 | Admitting: Family Medicine

## 2015-07-31 VITALS — BP 136/84 | HR 70 | Temp 98.8°F | Resp 16 | Ht 64.0 in | Wt 208.4 lb

## 2015-07-31 DIAGNOSIS — G588 Other specified mononeuropathies: Secondary | ICD-10-CM | POA: Insufficient documentation

## 2015-07-31 DIAGNOSIS — M5412 Radiculopathy, cervical region: Secondary | ICD-10-CM

## 2015-07-31 DIAGNOSIS — G5781 Other specified mononeuropathies of right lower limb: Secondary | ICD-10-CM

## 2015-07-31 DIAGNOSIS — G5761 Lesion of plantar nerve, right lower limb: Secondary | ICD-10-CM | POA: Diagnosis not present

## 2015-07-31 MED ORDER — PREGABALIN 50 MG PO CAPS: 50.0000 mg | ORAL_CAPSULE | Freq: Three times a day (TID) | ORAL | Status: DC

## 2015-07-31 NOTE — Progress Notes (Signed)
Name: Morgan Peterson   MRN: [id number]    DOB: 13-Sep-1951   Date:07/31/2015       Progress Note  Subjective  Chief Complaint  Chief Complaint  Patient presents with  . Shoulder Pain    pt here for 1 month f/u for shoulder pain    Shoulder Pain  Pertinent negatives include no fever, itching or tingling.   Cervical radiculopathy  Patient is the increasing pain and discomfort in the right shoulder and cervical area. She has had a regular x-ray which was not significant. She'll begin a trial of an anti-inflammatory muscle relaxant and antiseizure medication with no significant improvement. She has worsening of her symptomatology.   Probable foot neuroma  Complaint of pain and discomfort at the dorsum of the right foot at the second MTP joint sensation of a nodule being present.  Past Medical History  Diagnosis Date  . Diabetes mellitus without complication 1941  . Hypertension   . Personal history of malignant neoplasm of breast 2010  . IBS (irritable bowel syndrome)   . Cancer 2010    Right Breast  . 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 Arimidex therapy completed September 2015    Social History  Substance Use Topics  . Smoking status: Former Smoker -- 0.25 packs/day for 10 years    Types: Cigarettes  . Smokeless tobacco: Never Used  . Alcohol Use: 0.0 oz/week    0 Standard drinks or equivalent per week     Current outpatient prescriptions:  .  BYSTOLIC 10 MG tablet, Take 1 tablet by mouth  daily, Disp: 90 tablet, Rfl: 0 .  calcium carbonate 200 MG capsule, Take 250 mg by mouth 2 (two) times daily with a meal., Disp: , Rfl:  .  cholecalciferol (VITAMIN D) 1000 UNITS tablet, Take 1,000 Units by mouth daily., Disp: , Rfl:  .  cyclobenzaprine (FLEXERIL) 5 MG tablet, 1-2 TABLET, ORAL, EVERY EIGHT  HOURS, AS NEEDED, Disp: , Rfl: 1 .  gabapentin (NEURONTIN) 300 MG capsule, Take 1 capsule (300 mg total) by mouth 3 (three) times daily., Disp: 90 capsule, Rfl: 3 .  ibuprofen (ADVIL,MOTRIN) 200 MG tablet, Take 200 mg by mouth every 6 (six) hours as needed., Disp: , Rfl:  .  ibuprofen (ADVIL,MOTRIN) 800 MG tablet, Take 800 mg by mouth 3 (three) times daily., Disp: , Rfl: 0 .  lovastatin (MEVACOR) 40 MG tablet, Take 40 mg by mouth at bedtime., Disp: , Rfl:  .  meloxicam (MOBIC) 15 MG tablet, Take 1 tablet (15 mg total) by mouth daily., Disp: 30 tablet, Rfl: 1 .  triamterene-hydrochlorothiazide (MAXZIDE-25) 37.5-25 MG per tablet, Take 1 tablet by mouth daily., Disp: , Rfl:   Allergies  Allergen Reactions  . Actos [Pioglitazone] Other (See Comments)    Bladder pain  . Codeine   . Invokana [Canagliflozin] Other (See Comments)    Bladder pain  . Levaquin [Levofloxacin In D5w] Swelling  . Lisinopril Swelling    Angioedema  . Onglyza [Saxagliptin] Other (See Comments)    Increased PVC's  . Prednisolone Swelling  . Tramadol Nausea And Vomiting    Review of Systems  Constitutional: Negative.  Negative for fever, chills and weight loss.  HENT: Negative for congestion, hearing loss, sore throat and tinnitus.   Eyes: Negative for blurred vision, double vision and  redness.  Respiratory: Negative for cough, hemoptysis and shortness of breath.   Cardiovascular: Negative for chest pain, palpitations, orthopnea, claudication and leg swelling.  Gastrointestinal: Negative for heartburn, nausea, vomiting, diarrhea, constipation and blood in stool.  Genitourinary: Negative for dysuria, urgency, frequency and hematuria.  Musculoskeletal: Positive for neck pain. Negative for myalgias, back pain, joint pain and falls.       Radicular pain on the right distribution of C6 Right foot pain  Skin: Negative for itching.  Neurological: Negative for dizziness, tingling, tremors, focal weakness, seizures, loss of  consciousness, weakness and headaches.  Endo/Heme/Allergies: Does not bruise/bleed easily.  Psychiatric/Behavioral: Negative for depression and substance abuse. The patient is not nervous/anxious and does not have insomnia.      Objective  Filed Vitals:   07/31/15 1039  BP: 136/84  Pulse: 70  Temp: 98.8 F (37.1 C)  Resp: 16  Height: _0  (1.626 m)  Weight: 208 lb 7 oz (94.547 kg)  SpO2: 94%     Physical Exam  Constitutional: She is well-developed, well-nourished, and in no distress.  Neck:  Tenderness in the right sternocleidomastoid trapezius and scapular area. Lateral rotation of the C-spine limited to about 60. Lateral bending limited to about 30 secondary to pain. Normal strength.   Tenderness on the dorsal aspect of the second MTP joint with suggestion of a sessile mass that would be consistent with a neuroma.   Assessment & Plan   1. Cervical radiculopathy at C6 - Ambulatory referral to Orthopedic Surgery for epidural injections   2. Neuroma digital nerve, right Patient will make her own appointment with the podiatrist

## 2015-07-31 NOTE — Patient Instructions (Signed)

## 2015-08-05 ENCOUNTER — Telehealth: Payer: Self-pay | Admitting: Family Medicine

## 2015-08-05 NOTE — Telephone Encounter (Signed)
PT SAID THAT SHE IS HAVING A MRI THAT WILL TAKE 45 MIN AND SHE NEEDS SOMETHING TO KEEP HER CALM. PHARM IS [facility] IN [place]. SHE SAID THAT SHE HAS SOME MUSCLE RELAXER CAN SHE TAKE TWO OF THEM.

## 2015-08-06 NOTE — Telephone Encounter (Signed)
ERRENOUS °

## 2015-08-06 NOTE — Telephone Encounter (Signed)
yes

## 2015-08-06 NOTE — Telephone Encounter (Signed)
Patient notified

## 2015-08-07 ENCOUNTER — Ambulatory Visit
Admission: RE | Admit: 2015-08-07 | Discharge: 2015-08-07 | Disposition: A | Discharge status: Home / Self Care | Payer: 59 | Source: Ambulatory Visit | Attending: Family Medicine | Admitting: Family Medicine

## 2015-08-07 DIAGNOSIS — M5412 Radiculopathy, cervical region: Secondary | ICD-10-CM

## 2015-08-07 DIAGNOSIS — M4802 Spinal stenosis, cervical region: Secondary | ICD-10-CM | POA: Diagnosis not present

## 2015-08-08 ENCOUNTER — Telehealth: Payer: Self-pay | Admitting: Emergency Medicine

## 2015-08-08 DIAGNOSIS — M5010 Cervical disc disorder with radiculopathy, unspecified cervical region: Secondary | ICD-10-CM

## 2015-08-08 NOTE — Telephone Encounter (Signed)
Patient notified

## 2015-08-08 NOTE — Telephone Encounter (Signed)
Patient notified. Will go to Neurosurgery In [place]

## 2015-08-21 ENCOUNTER — Other Ambulatory Visit: Payer: Self-pay | Admitting: Family Medicine

## 2015-08-21 NOTE — Telephone Encounter (Signed)
I am forwarding this encounter to the designated PCP and/or their nursing staff for further management of the tasks requested. Thank you.  

## 2015-08-29 ENCOUNTER — Other Ambulatory Visit: Payer: Self-pay | Admitting: Family Medicine

## 2015-09-26 ENCOUNTER — Telehealth: Payer: Self-pay | Admitting: Family Medicine
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
## 2015-09-29 NOTE — Telephone Encounter (Signed)
ok 

## 2015-09-30 ENCOUNTER — Encounter: Payer: Self-pay | Admitting: Emergency Medicine

## 2015-09-30 ENCOUNTER — Ambulatory Visit
Admission: EM | Admit: 2015-09-30 | Discharge: 2015-09-30 | Disposition: A | Discharge status: Home / Self Care | Payer: 59 | Attending: Family Medicine | Admitting: Family Medicine

## 2015-09-30 ENCOUNTER — Other Ambulatory Visit: Payer: Self-pay | Admitting: Family Medicine

## 2015-09-30 DIAGNOSIS — E1165 Type 2 diabetes mellitus with hyperglycemia: Secondary | ICD-10-CM

## 2015-09-30 LAB — COMPREHENSIVE METABOLIC PANEL
ALT: 22 U/L (ref 14–54)
ANION GAP: 12 (ref 5–15)
AST: 30 U/L (ref 15–41)
Albumin: 4.4 g/dL (ref 3.5–5.0)
Alkaline Phosphatase: 172 U/L — ABNORMAL HIGH (ref 38–126)
BUN: 10 mg/dL (ref 6–20)
CALCIUM: 9.4 mg/dL (ref 8.9–10.3)
CO2: 28 mmol/L (ref 22–32)
Chloride: 94 mmol/L — ABNORMAL LOW (ref 101–111)
Creatinine, Ser: 0.98 mg/dL (ref 0.44–1.00)
GFR calc non Af Amer: 60 mL/min — ABNORMAL LOW (ref >60)
Glucose, Bld: 419 mg/dL — ABNORMAL HIGH (ref 65–99)
Potassium: 3.7 mmol/L (ref 3.5–5.1)
SODIUM: 134 mmol/L — AB (ref 135–145)
Total Bilirubin: 0.4 mg/dL (ref 0.3–1.2)
Total Protein: 8.3 g/dL — ABNORMAL HIGH (ref 6.5–8.1)

## 2015-09-30 LAB — CBC WITH DIFFERENTIAL/PLATELET
BASOS ABS: 0.1 10*3/uL (ref 0–0.1)
BASOS PCT: 1 %
Eosinophils Absolute: 0.2 10*3/uL (ref 0–0.7)
Eosinophils Relative: 2 %
HEMATOCRIT: 44.3 % (ref 35.0–47.0)
HEMOGLOBIN: 14 g/dL (ref 12.0–16.0)
Lymphocytes Relative: 26 %
Lymphs Abs: 2.2 10*3/uL (ref 1.0–3.6)
MCH: 26.6 pg (ref 26.0–34.0)
MCHC: 31.5 g/dL — ABNORMAL LOW (ref 32.0–36.0)
MCV: 84.4 fL (ref 80.0–100.0)
Monocytes Absolute: 0.3 10*3/uL (ref 0.2–0.9)
Monocytes Relative: 4 %
NEUTROS ABS: 5.4 10*3/uL (ref 1.4–6.5)
NEUTROS PCT: 67 %
Platelets: 212 10*3/uL (ref 150–440)
RBC: 5.25 MIL/uL — AB (ref 3.80–5.20)
RDW: 14.6 % — ABNORMAL HIGH (ref 11.5–14.5)
WBC: 8.1 10*3/uL (ref 3.6–11.0)

## 2015-09-30 LAB — URINALYSIS COMPLETE WITH MICROSCOPIC (ARMC ONLY)
BACTERIA UA: NONE SEEN — AB
Bilirubin Urine: NEGATIVE
Glucose, UA: >1000 mg/dL — AB
LEUKOCYTES UA: NEGATIVE
NITRITE: NEGATIVE
Protein, ur: NEGATIVE mg/dL
Specific Gravity, Urine: 1.02 (ref 1.005–1.030)
pH: 5.5 (ref 5.0–8.0)

## 2015-09-30 LAB — GLUCOSE, CAPILLARY: Glucose-Capillary: 459 mg/dL — ABNORMAL HIGH (ref 65–99)

## 2015-09-30 MED ORDER — METFORMIN HCL ER (OSM) 500 MG PO TB24: ORAL_TABLET | ORAL | Status: DC

## 2015-09-30 MED ORDER — FLUCONAZOLE 150 MG PO TABS: 150.0000 mg | ORAL_TABLET | Freq: Once | ORAL | Status: DC

## 2015-09-30 MED ORDER — METFORMIN HCL 500 MG PO TABS
500.0000 mg | ORAL_TABLET | Freq: Two times a day (BID) | ORAL | Status: DC
Start: 2015-09-30 — End: 2015-09-30

## 2015-09-30 NOTE — Progress Notes (Signed)
Patient called with frequent urination and blood sugar elevated at 565 at 2:49 pm and recheck 488 at 3:30. Patient was informed to go to ER or a Urgent Care. Patient stated she had medication from a previous appointment that was prescribed and was going to take Metformin. I spoke to Dr. Nadine Counts and she informed patient not to take medication due to she may now have some kidney issues. Patient understood concerns and stated she may go to a Urgent Care.

## 2015-09-30 NOTE — ED Provider Notes (Signed)
CSN: [id number]     Arrival date & time 09/30/15  69 History   First MD Initiated Contact with Patient 09/30/15 [place]    Nurses notes were reviewed. Chief Complaint  Patient presents with  . Hyperglycemia   (Consider location/radiation/quality/duration/timing/severity/associated sxs/prior Treatment) Patient is a 64 y.o. female presenting with hyperglycemia. The history is provided by the patient.  Hyperglycemia Severity:  Moderate Onset quality:  Unable to specify Progression:  Worsening Chronicity:  Chronic Diabetes status:  Unable to specify (She is a diabetic but not controlled or on any medication) Current diabetic therapy:  None Context: noncompliance and recent change in diet   Context: not change in medication, not insulin pump use, not new diabetes diagnosis and not recent illness   Relieved by:  Nothing Ineffective treatments:  None tried Associated symptoms: blurred vision, increased appetite and increased thirst   Associated symptoms: no abdominal pain, no chest pain, no confusion, no dehydration, no shortness of breath, no weakness and no weight change   Risk factors: family hx of diabetes and obesity   Risk factors: no hx of DKA, no pregnancy and no recent steroid use      Patient is here because of elevated blood sugar. She reports visual disturbances yesterday that continued today. She has a known history of diabetes but has not been able to tolerate multiple diabetic medications that she's been placed on. Some of the problems are significant other is just more nuisance but still there were side effects. Because of that patient has not been on any diabetic medication for a number of years. In fact after not tolerating metformin she was placed on metformin ER but never took the metformin at all. She states she still has the bottle somewhere home. She has not checked her blood sugar an weeks until this morning. One should find a blood sugars over 500 she contacted her PCPs  office was recommended she come here for follow-up. Other than blurred vision she denies any other side effects at this time. She has a strong family history diabetes with some sisters and father with diabetes. Interesting when her sisters had blood sugars in the 5 and she immediately to the emergency room and was more aggressive with her care management and she has been with her.      Past Medical History  Diagnosis Date  . Diabetes mellitus without complication ([place]) 7408  . Hypertension   . Personal history of malignant neoplasm of breast 2010  . IBS (irritable bowel syndrome)   . Cancer [facility]) 2010    Right Breast  . 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 Arimidex therapy completed September 2015   Past Surgical History  Procedure Laterality Date  . Abdominal exploration surgery  2000  . Colonoscopy  2003, 2013    Dr Alveta Heimlich, Dr. Bary Castilla  . Tubal ligation  20 years ago  . Breast lumpectomy Right 2010  . Breast biopsy Right 2010   Family History  Problem Relation Age of Onset  . Colon cancer Maternal Uncle   . Breast cancer Maternal Aunt   . Hypertension Mother   . Diabetes Father    Social History  Substance Use Topics  . Smoking status: Former Smoker -- 0.25 packs/day for 10 years    Types: Cigarettes  . Smokeless tobacco: Never Used  .  Alcohol Use: 0.0 oz/week    0 Standard drinks or equivalent per week   OB History    Gravida Para Term Preterm AB TAB SAB Ectopic Multiple Living   '2 2        2      ' Obstetric Comments   1st Menstrual Cycle:  14 1st Pregnancy:  26      She does not smoke anymore. History of hypertension and PVCs. 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 She's been unable to tolerate Mobic, other  anti-inflammatories, and muscle relaxants. She is not taking prednisone at this time for this bulging disc  Review of Systems  Eyes: Positive for blurred vision.  Respiratory: Negative for shortness of breath.   Cardiovascular: Negative for chest pain.  Gastrointestinal: Negative for abdominal pain.  Endocrine: Positive for polydipsia.  Neurological: Negative for weakness.  Psychiatric/Behavioral: Negative for confusion.  All other systems reviewed and are negative.   Allergies  Actos; Codeine; Invokana; Levaquin; Lisinopril; Onglyza; Prednisolone; and Tramadol  Home Medications   Prior to Admission medications   Medication Sig Start Date End Date Taking? Authorizing Provider  BYSTOLIC 10 MG tablet Take 1 tablet by mouth  daily 08/29/15   Ashok Norris, MD  calcium carbonate 200 MG capsule Take 250 mg by mouth 2 (two) times daily with a meal.    Historical Provider, MD  cholecalciferol (VITAMIN D) 1000 UNITS tablet Take 1,000 Units by mouth daily.    Historical Provider, MD  cyclobenzaprine (FLEXERIL) 5 MG tablet 1-2 TABLET, ORAL, EVERY EIGHT HOURS, AS NEEDED 08/26/15   Ashok Norris, MD  gabapentin (NEURONTIN) 300 MG capsule TAKE 1 CAPSULE 1X DAILY X 7 DAYS THEN 1 CAPSULE 2X DAILY X 7 DAYS THEN 1 CAPSULE 3X DAILY 08/21/15   Ashok Norris, MD  ibuprofen (ADVIL,MOTRIN) 200 MG tablet Take 200 mg by mouth every 6 (six) hours as needed.    Historical Provider, MD  ibuprofen (ADVIL,MOTRIN) 800 MG tablet Take 800 mg by mouth 3 (three) times daily. 04/10/15   Historical Provider, MD  lovastatin (MEVACOR) 40 MG tablet Take 40 mg by mouth at bedtime.    Historical Provider, MD  meloxicam (MOBIC) 15 MG tablet TAKE 1 TABLET BY MOUTH EVERY DAY 08/21/15   Ashok Norris, MD  pregabalin (LYRICA) 50 MG capsule Take 1 capsule (50 mg total) by mouth 3 (three) times daily. 07/31/15   Ashok Norris, MD  triamterene-hydrochlorothiazide (MAXZIDE-25) 37.5-25 MG per tablet Take 1 tablet by mouth daily. 11/26/13    Historical Provider, MD   Meds Ordered and Administered this Visit  Medications - No data to display  BP 156/77 mmHg  Pulse 70  Temp(Src) 96.7 F (35.9 C) (Tympanic)  Resp 16  Ht '5\' 6"'  (1.676 m)  Wt 210 lb (95.255 kg)  BMI 33.91 kg/m2  SpO2 99% No data found.   Physical Exam  Constitutional: She appears well-nourished.  HENT:  Head: Normocephalic and atraumatic.  Eyes: Pupils are equal, round, and reactive to light.  Neck: Normal range of motion. Neck supple.  Cardiovascular: Normal rate and regular rhythm.   Pulmonary/Chest: Effort normal and breath sounds normal.  Musculoskeletal: Normal range of motion. She exhibits no edema.  Neurological: She is alert.  Skin: Skin is warm and dry. No erythema.  Psychiatric: She has a normal mood and affect. Her behavior is normal.  Vitals reviewed.   ED Course  Procedures (including critical care time)  Labs  Review Labs Reviewed  GLUCOSE, CAPILLARY - Abnormal; Notable for the following:    Glucose-Capillary 459 (*)    All other components within normal limits  COMPREHENSIVE METABOLIC PANEL  CBC WITH DIFFERENTIAL/PLATELET  HEMOGLOBIN A1C  CBG MONITORING, ED    Imaging Review No results found.   Visual Acuity Review  Right Eye Distance:   Left Eye Distance:   Bilateral Distance:    Right Eye Near:   Left Eye Near:    Bilateral Near:         MDM  No diagnosis found.   Diabetes poorly controlled. Stress to her that she may need to go on insulin. She is a type II diabetic that has been so out of control the pancreas is now basically not providing anything for her to control her hyperglycemia. 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 We spent an extensive amount time discussing the diabetes and her options. Warned her that insulin may be her only choice which she absolutely does  not want to do or use. That We'll sent to the emergency room since this with overdue but instead stressed her she is checking blood sugars for the next several days twice a day. Restart metformin ER 2 tablets tonight and then 2 tablets at night and after several days take 1 tablet in the morning as well and gradually take 2 tablets twice a day. Follow-up with PCP Friday or Monday at the latest. If blood sugars can go up over 600 she will not have a choice but to go to the emergency room for probable insulin start.  We'll obtain CMP CBC A1c since she's been not had an A1c done in quite a while.    We'll place on the metformin extended release tablets as mentioned above. Fortunately labs: Show 1+ ketones to allow her to go home without starting insulin treatment.   Frederich Cha, MD 09/30/15 [phone]

## 2015-09-30 NOTE — Discharge Instructions (Signed)
Hyperglycemia High blood sugar (hyperglycemia) means that the level of sugar in your blood is higher than it should be. Signs of high blood sugar include:  Feeling thirsty.  Frequent peeing (urinating).  Feeling tired or sleepy.  Dry mouth.  Vision changes.  Feeling weak.  Feeling hungry but losing weight.  Numbness and tingling in your hands or feet.  Headache. When you ignore these signs, your blood sugar may keep going up. These problems may get worse, and other problems may begin. HOME CARE  Check your blood sugars as told by your doctor. Write down the numbers with the date and time.  Take the right amount of insulin or diabetes pills at the right time. Write down the dose with date and time.  Refill your insulin or diabetes pills before running out.  Watch what you eat. Follow your meal plan.  Drink liquids without sugar, such as water. Check with your doctor if you have kidney or heart disease.  Follow your doctor's orders for exercise. Exercise at the same time of day.  Keep your doctor's appointments. GET HELP RIGHT AWAY IF:   You have trouble thinking or are confused.  You have fast breathing with fruity smelling breath.  You pass out (faint).  You have 2 to 3 days of high blood sugars and you do not know why.  You have chest pain.  You are feeling sick to your stomach (nauseous) or throwing up (vomiting).  You have sudden vision changes. MAKE SURE YOU:   Understand these instructions.  Will watch your condition.  Will get help right away if you are not doing well or get worse.   This information is not intended to replace advice given to you by your health care provider. Make sure you discuss any questions you have with your health care provider.   Document Released: 10/03/2009 Document Revised: 12/27/2014 Document Reviewed: 08/12/2015 Elsevier Interactive Patient Education 2016 [employer]  Type 2 Diabetes Mellitus, Adult Type 2 diabetes  mellitus is a long-term (chronic) disease. In type 2 diabetes:  The pancreas does not make enough of a hormone called insulin.  The cells in the body do not respond as well to the insulin that is made.  Both of the above can happen. Normally, insulin moves sugars from food into tissue cells. This gives you energy. If you have type 2 diabetes, sugars cannot be moved into tissue cells. This causes high blood sugar (hyperglycemia).  Your doctors will set personal treatment goals for you based on your age, your medicines, how long you have had diabetes, and any other medical conditions you have. Generally, the goal of treatment is to maintain the following blood glucose levels:  Before meals (preprandial): 80-130 mg/dL.  After meals (postprandial): below 180 mg/dL.  A1c: less than 6.5-7%. HOME CARE  Have your hemoglobin A1c level checked twice a year. The level shows if your diabetes is under control or out of control.  Test your blood sugar level every day as told by your doctor.  Check your ketone levels by testing your pee (urine) when you are sick and as told.  Take your diabetes or insulin medicine as told by your doctor.  Never run out of insulin.  Adjust how much insulin you give yourself based on how many carbs (carbohydrates) you eat. Carbs are in many foods, such as fruits, vegetables, whole grains, and dairy products.  Have a healthy snack between every healthy meal. Have 3 meals and 3 snacks a day.  Lose weight if you are overweight.  Carry a medical alert card or wear your medical alert jewelry.  Carry a 15-gram carb snack with you at all times. Examples include:  Glucose pills, 3 or 4.  Glucose gel, 15-gram tube.  Raisins, 2 tablespoons (24 grams).  Jelly beans, 6.  Animal crackers, 8.  Regular (not diet) pop, 4 ounces (120 milliliters).  Gummy treats, 9.  Notice low blood sugar (hypoglycemia) symptoms, such as:  Shaking (tremors).  Trouble thinking  clearly.  Sweating.  Faster heart rate.  Headache.  Dry mouth.  Hunger.  Crabbiness (irritability).  Being worried or tense (anxious).  Restless sleep.  A change in speech or coordination.  Confusion.  Treat low blood sugar right away. If you are alert and can swallow, follow the 15:15 rule:  Take 15-20 grams of a rapid-acting glucose or carb. This includes glucose gel, glucose pills, or 4 ounces (120 milliliters) of fruit juice, regular pop, or low-fat milk.  Check your blood sugar level 15 minutes after taking the glucose.  Take 15-20 grams more of glucose if the repeat blood sugar level is still 70 mg/dL (milligrams/deciliter) or below.  Eat a meal or snack within 1 hour of the blood sugar levels going back to normal.  Notice early symptoms of high blood sugar, such as:  Being really thirsty or drinking a lot (polydipsia).  Peeing a lot (polyuria).  Do at least 150 minutes of physical activity a week or as told.  Split the 150 minutes of activity up during the week. Do not do 150 minutes of activity in one day.  Perform exercises, such as weight lifting, at least 2 times a week or as told.  Spend no more than 90 minutes at one time inactive.  Adjust your insulin or food intake as needed if you start a new exercise or sport.  Follow your sick-day plan when you are not able to eat or drink as usual.  Do not smoke, chew tobacco, or use electronic cigarettes.  Women who are not pregnant should drink no more than 1 drink a day. Men should drink no more than 2 drinks a day.  Only drink alcohol with food.  Ask your doctor if alcohol is safe for you.  Tell your doctor if you drink alcohol several times during the week.  See your doctor regularly.  Schedule an eye exam soon after you are told you have diabetes. Schedule exams once every year.  Check your skin and feet every day. Check for cuts, bruises, redness, nail problems, bleeding, blisters, or sores. A  doctor should do a foot exam once a year.  Brush your teeth and gums twice a day. Floss once a day. Visit your dentist regularly.  Share your diabetes plan with your workplace or school.  Keep your shots that fight diseases (vaccines) up to date.  Get a flu (influenza) shot every year.  Get a pneumonia shot. If you are 33 years of age or older and you have never gotten a pneumonia shot, you might need to get two shots.  Ask your doctor which other shots you should get.  Learn how to deal with stress.  Get diabetes education and support as needed.  Ask your doctor for special help if:  You need help to maintain or improve how you do things on your own.  You need help to maintain or improve the quality of your life.  You have foot or hand problems.  You have trouble  cleaning yourself, dressing, eating, or doing physical activity. GET HELP IF:  You are unable to eat or drink for more than 6 hours.  You feel sick to your stomach (nauseous) or throw up (vomit) for more than 6 hours.  Your blood sugar level is over 240 mg/dL.  There is a change in mental status.  You get another serious illness.  You have watery poop (diarrhea) for more than 6 hours.  You have been sick or have had a fever for 2 or more days and are not getting better.  You have pain when you are active. GET HELP RIGHT AWAY IF:  You have trouble breathing.  Your ketone levels are higher than your doctor says they should be. MAKE SURE YOU:  Understand these instructions.  Will watch your condition.  Will get help right away if you are not doing well or get worse.   This information is not intended to replace advice given to you by your health care provider. Make sure you discuss any questions you have with your health care provider.   Document Released: 09/14/2008 Document Revised: 04/22/2015 Document Reviewed: 07/07/2012 Elsevier Interactive Patient Education 2016 [place].  Insulin  Treatment for Diabetes Diabetes is a disease that does not go away (chronic). It occurs when the body does not properly use the sugar (glucose) that is released from food after it is digested. Glucose levels are controlled by a hormone called insulin, which is made by your pancreas. Depending on the type of diabetes you have, either of the following will apply:   The pancreas does not make any insulin (type 1 diabetes).  The pancreas makes too little insulin, and the body cannot respond normally to the insulin that is made (type 2 diabetes). Without insulin, death can occur. However, with the addition of insulin, blood sugar monitoring, and treatment, someone with diabetes can live a full and productive life. This document will discuss the role of insulin in your treatment and provide information about its use.  HOW IS INSULIN GIVEN? Insulin is a medicine that can only be given by injection. Taking it by mouth makes it inactive because of the acid in your stomach. Insulin is injected under the skin by a syringe and needle, an insulin pen, a pump, or a jet injector. Your dose will be determined by your health care provider based on your individual needs. You will also be given guidance on which method of giving insulin is right for you. Remember that if you give insulin with a needle and syringe, you must do so using only a special insulin syringe made for this purpose. WHERE ON THE BODY SHOULD INSULIN BE INJECTED? Insulin is injected into the fatty layer of tissue just under your skin. Good places to inject insulin include the upper arm, the front and outer area of the thigh, the hips, and the abdomen. Giving your insulin in the abdomen is preferred because this provides the most rapid and consistent absorption. Avoid the area 2 inches (5 cm) around the navel and avoid injecting into areas on your body with scar tissue. In addition, it is important to rotate your injection sites with every shot to prevent  irritation and improve absorption. WHAT ARE THE DIFFERENT TYPES OF INSULIN?  If you have type 1 diabetes, you must take insulin to stay alive. Your body does not produce it. If you have type 2 diabetes, you might require insulin in addition to, or instead of, other medicines. In either case,  proper use of insulin is critical to control your diabetes.  There are a number of different types of insulin. Usually, you will give yourself injections, though others can be trained to give them to you. Some people have an insulin pump that delivers insulin continuously through a tube (cannula) that is placed under the skin. Using insulin requires that you check your blood sugar several times a day. The exact number of times and time of day to check will vary depending on your type of diabetes, your type of insulin, and treatment goals. Your health care provider will direct you.  Generally, different insulins have different properties. The following is a general guide. Specifics will vary by product, and new products are introduced periodically.   Rapid-acting insulin starts working quickly (in as little as 5 minutes) and wears off in 4 to 6 hours (sometimes longer). This type of insulin works well when taken just before a meal to bring your blood sugar quickly back to normal.   Short-acting insulin starts working in about 30 minutes and can last 6 to 10 hours. This type of insulin should be taken about 30 minutes before you start eating a meal.  Intermediate-acting insulin starts working in 1-2 hours and wears off after about 10 to 18 hours. This insulin will lower your blood sugar for a longer period of time, but it will not be as effective in lowering your blood sugar right after a meal.   Long-acting insulin mimics the small amount of insulin that your pancreas usually produces throughout the day. You need to have some insulin present at all times. It is crucial to the metabolism of brain cells and other  cells. Long-acting insulin is meant to be used either once or twice a day. It is usually used in combination with other types of insulin, or in combination with other diabetes medicines.  Discuss the type of insulin you are taking with your health care provider or pharmacist. You will then be aware of when the insulin can be expected to peak and when it will wear off. This is important to know so you can plan for meal times and periods of exercise.  Your health care provider will usually have a strategy in mind when treating you with insulin. This will vary with your type of diabetes, your diabetes treatment goals, and your health history. It is important that you understand this strategy so you can be an active partner in treating your diabetes. Here are some terms you might hear:   Basal insulin. This refers to the small amount of insulin that needs to be present in your blood at all times. Sometimes oral medicines will be enough. For other people, and especially for people with type 1 diabetes, insulin is needed. Usually, intermediate-acting or long-acting insulin is used once or twice a day to accomplish this.   Prandial (meal-related) insulin. Your blood sugar will rise rapidly after a meal. Rapid-acting or short-acting insulin can be used right before the meal to bring your blood sugar back to normal quickly. You might be instructed to adjust the amount of insulin depending on how much carbohydrate (starch) is in your meal.   Corrective insulin. You might be instructed to check your blood sugar at certain times of the day. You then might use a small amount of rapid-acting or short-acting insulin to bring the blood sugar down to normal if it is elevated.   Tight control (also called intensive therapy). Tight control means keeping your  blood sugar as close to your target as possible and keeping it from going too high after meals. People with tight control of their diabetes are shown to have fewer  long-term problems from their diabetes.   Glycohemoglobin (also called glyco, glycosylated hemoglobin, hemoglobin A1c, or A1c) level. This measures how well your blood sugar has been controlled during the past 1 to 3 months. It helps your health care provider see how effective your treatment is and decide if any changes are needed. Your health care provider will discuss your target glycohemoglobin level with you.  Insulin treatment requires your careful attention. While you are being treated with insulin, you should check your blood glucose at least two times each day. Treatment plans will be different for different people. Some people do well with a simple program. Others require more complicated programs, with multiple insulin injections daily. You will work with your health care provider to develop the best program for you. Regardless of your insulin treatment plan, you must also do your best on weight control, diet and food choices, exercise, blood pressure control, cholesterol control, and stress levels.  WHAT ARE THE SIDE EFFECTS OF INSULIN? Although insulin treatment is important, it does have some side effects, such as:   Insulin can cause your blood sugar to go too low (hypoglycemia).   Weight gain can occur.   Improper injection technique can cause hypoglycemia, blood sugar to go too high (hyperglycemia), skin injury or irritation, or other problems. You must learn to inject insulin properly.   This information is not intended to replace advice given to you by your health care provider. Make sure you discuss any questions you have with your health care provider.   Document Released: 03/04/2009 Document Revised: 12/27/2014 Document Reviewed: 05/20/2013 Elsevier Interactive Patient Education 2016 [employer] Inc[employer]  Hemoglobin A1c Test Some of the sugar (glucose) that circulates in your blood sticks or binds to blood proteins. Hemoglobin (Hb or Hgb) is one type of blood protein that  glucose binds to. It also carries oxygen in the red blood cells (RBCs). When glucose binds to Hb, the glucose-coated Hb is called glycated Hb. Once Hb is glycated, it remains that way for the life of the RBC. This is about 120 days. Rather than testing your blood glucose level on one single day, the hemoglobin A1c (HbA1c) test measures the average amount of glycated hemoglobin and, therefore, the average amount of glucose in your blood during the 3-4 months just before the test is done. The HbA1c test is used to monitor long-term control of blood sugar in people who have diabetes mellitus. The HbA1c test can also be used in addition to or in combination with fasting blood glucose level and oral glucose tolerance tests. RESULTS It is your responsibility to obtain your test results. Ask the lab or department performing the test when and how you will get your results. Contact your health care provider to discuss any questions you have about your results. Range of Normal Values Ranges for normal values may vary among different labs and hospitals. You should always check with your health care provider after having lab work or other tests done to discuss the meaning of your test results and whether your values are considered within normal limits. The ranges for normal HbA1c test results are as follows:  Adult or child without diabetes: 4-5.9%.  Adult or child with diabetes and good blood glucose control: less than 6.5%. Several factors can affect HbA1c test results. These may include:  Diseases (hemoglobinopathies) that cause a change in the shape, size, or amount of Hb in your blood.  Longer than normal RBC life span.  Abnormally low levels of certain proteins in your blood.  Eating foods or taking supplements that are high in vitamin C (ascorbic acid). Meaning of Results Outside Normal Value Ranges Abnormally high HbA1c values are most commonly an indication of prediabetes mellitus and diabetes  mellitus:  An HbA1c result of 5.7-6.4% is considered diagnostic of prediabetes mellitus.  An HbA1c result of 6.5% or higher on two separate occasions is considered diagnostic of diabetes mellitus. Abnormally low HbA1c values can be caused by several health conditions. These may include:  Pregnancy.  A large amount of blood loss.  Blood transfusions.  Low red blood cell count (anemia). This is caused by premature destruction of red blood cells.  Long-term kidney failure.  Some unusual forms of Hb (Hb variants), such as sickle cell trait. Discuss your test results with your health care provider. He or she will use the results to make a diagnosis and determine a treatment plan that is right for you.   This information is not intended to replace advice given to you by your health care provider. Make sure you discuss any questions you have with your health care provider.   Document Released: 12/28/2004 Document Revised: 12/27/2014 Document Reviewed: 04/22/2014 Elsevier Interactive Patient Education [employer].

## 2015-09-30 NOTE — ED Notes (Signed)
Patient reports elevated blood sugar today.  Patient reports she checked her blood sugar was over 500.

## 2015-10-01 ENCOUNTER — Emergency Department
Admission: EM | Admit: 2015-10-01 | Discharge: 2015-10-01 | Disposition: A | Discharge status: Home / Self Care | Payer: 59 | Attending: Emergency Medicine | Admitting: Emergency Medicine

## 2015-10-01 DIAGNOSIS — Z791 Long term (current) use of non-steroidal anti-inflammatories (NSAID): Secondary | ICD-10-CM | POA: Insufficient documentation

## 2015-10-01 DIAGNOSIS — I10 Essential (primary) hypertension: Secondary | ICD-10-CM | POA: Insufficient documentation

## 2015-10-01 DIAGNOSIS — Z87891 Personal history of nicotine dependence: Secondary | ICD-10-CM | POA: Diagnosis not present

## 2015-10-01 DIAGNOSIS — E1165 Type 2 diabetes mellitus with hyperglycemia: Secondary | ICD-10-CM | POA: Insufficient documentation

## 2015-10-01 DIAGNOSIS — Z79899 Other long term (current) drug therapy: Secondary | ICD-10-CM | POA: Diagnosis not present

## 2015-10-01 DIAGNOSIS — R739 Hyperglycemia, unspecified: Secondary | ICD-10-CM

## 2015-10-01 LAB — CBC
HCT: 41.8 % (ref 35.0–47.0)
Hemoglobin: 13.9 g/dL (ref 12.0–16.0)
MCH: 27.6 pg (ref 26.0–34.0)
MCHC: 33.2 g/dL (ref 32.0–36.0)
MCV: 83.2 fL (ref 80.0–100.0)
Platelets: 203 10*3/uL (ref 150–440)
RBC: 5.03 MIL/uL (ref 3.80–5.20)
RDW: 14.2 % (ref 11.5–14.5)
WBC: 6.8 10*3/uL (ref 3.6–11.0)

## 2015-10-01 LAB — URINALYSIS COMPLETE WITH MICROSCOPIC (ARMC ONLY)
BILIRUBIN URINE: NEGATIVE
Bacteria, UA: NONE SEEN
Glucose, UA: >500 mg/dL — AB
Hgb urine dipstick: NEGATIVE
Nitrite: NEGATIVE
PROTEIN: NEGATIVE mg/dL
SPECIFIC GRAVITY, URINE: 1.028 (ref 1.005–1.030)
pH: 6 (ref 5.0–8.0)

## 2015-10-01 LAB — BASIC METABOLIC PANEL
ANION GAP: 9 (ref 5–15)
BUN: 15 mg/dL (ref 6–20)
CO2: 24 mmol/L (ref 22–32)
Calcium: 9.3 mg/dL (ref 8.9–10.3)
Chloride: 100 mmol/L — ABNORMAL LOW (ref 101–111)
Creatinine, Ser: 0.87 mg/dL (ref 0.44–1.00)
GFR calc Af Amer: >60 mL/min (ref >60)
GFR calc non Af Amer: >60 mL/min (ref >60)
Glucose, Bld: 463 mg/dL — ABNORMAL HIGH (ref 65–99)
POTASSIUM: 4.1 mmol/L (ref 3.5–5.1)
SODIUM: 133 mmol/L — AB (ref 135–145)

## 2015-10-01 LAB — GLUCOSE, CAPILLARY
Glucose-Capillary: 347 mg/dL — ABNORMAL HIGH (ref 65–99)
Glucose-Capillary: 341 mg/dL — ABNORMAL HIGH (ref 65–99)

## 2015-10-01 LAB — HEMOGLOBIN A1C: Hgb A1c MFr Bld: 10.4 % — ABNORMAL HIGH (ref 4.0–6.0)

## 2015-10-01 MED ORDER — METFORMIN HCL 850 MG PO TABS
850.0000 mg | ORAL_TABLET | Freq: Once | ORAL | Status: AC
  Administered 2015-10-01: 850 mg via ORAL
  Filled 2015-10-01: qty 1

## 2015-10-01 MED ORDER — SODIUM CHLORIDE 0.9 % IV BOLUS (SEPSIS)
1000.0000 mL | Freq: Once | INTRAVENOUS | Status: AC
  Administered 2015-10-01: 1000 mL via INTRAVENOUS

## 2015-10-01 NOTE — ED Notes (Signed)
Pt states has taken multiple medications to treat type 2 diabetes, most recently metformin. Pt states some complications she had from the medications was PVCs and yeast infections. Pt states blood sugar was over 500 last night and she took two metformin 500 mg extended release. Has taken her blood sugar several times today and has remained with blood sugars above 300. Pt states last blood sugar reading was 455 today at approximately 5:45.

## 2015-10-01 NOTE — ED Provider Notes (Signed)
[facility] Emergency Department Provider Note  ____________________________________________  Time seen: 1915  I have reviewed the triage vital signs and the nursing notes.   HISTORY  Chief Complaint Hyperglycemia     HPI Morgan Peterson is a 64 y.o. female with history of diabetes type 2. This was diagnosed last year. She presents to emergency department with a 2 to three-day history of hyperglycemia with glucose levels in the 400s. She was seen yesterday at med in urgent care by Dr. Alveta Heimlich. He advised her to take metformin. She took 1000 mg last night. She was unclear on the instructions and whether she should continue to she has not had any metformin today. The patient reports she feels well. She is not feeling weak, nauseous, or having any abdominal pain. She is concerned about the elevated sugar level that has continued today.  She reports she has not drank much water today, though she reports she drank more water yesterday. She says she has a case of 12 ounce bottles of water and she drank 2 of them yesterday.  Reviewing the patient's diet, she does report that she had increased sugar intake and the month of September, but over the past few days she has been pretty good. When she was eating more candy in September, she had blood sugar levels of mid to high 100s, and not this higher level of 3-400 and she now has.    Past Medical History  Diagnosis Date  . Diabetes mellitus without complication ([place]) 7782  . Hypertension   . Personal history of malignant neoplasm of breast 2010  . IBS (irritable bowel syndrome)   . Cancer [facility]) 2010    Right Breast  . 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 Arimidex therapy completed September 2015    Patient Active Problem List   Diagnosis  Date Noted  . Cervical radiculopathy at C6 07/31/2015  . Neuroma digital nerve 07/31/2015  . Type 2 diabetes mellitus without complication ([place]) 42/35/3614  . Right shoulder pain 06/30/2015  . Cervical disc disease 06/30/2015  . Essential hypertension 06/30/2015  . Breast cancer ([place]) 01/07/2014    Past Surgical History  Procedure Laterality Date  . Abdominal exploration surgery  2000  . Colonoscopy  2003, 2013    Dr Alveta Heimlich, Dr. Bary Castilla  . Tubal ligation  20 years ago  . Breast lumpectomy Right 2010  . Breast biopsy Right 2010    Current Outpatient Rx  Name  Route  Sig  Dispense  Refill  . BYSTOLIC 10 MG tablet      Take 1 tablet by mouth  daily   90 tablet   1   . calcium carbonate 200 MG capsule   Oral   Take 250 mg by mouth 2 (two) times daily with a meal.         . cholecalciferol (VITAMIN D) 1000 UNITS tablet   Oral   Take 1,000 Units by mouth daily.         . cyclobenzaprine (FLEXERIL) 5 MG tablet      1-2 TABLET, ORAL, EVERY EIGHT HOURS, AS NEEDED   60 tablet   1   . fluconazole (DIFLUCAN) 150 MG tablet   Oral   Take 1 tablet (150 mg total) by mouth once.   2 tablet  0   . gabapentin (NEURONTIN) 300 MG capsule      TAKE 1 CAPSULE 1X DAILY X 7 DAYS THEN 1 CAPSULE 2X DAILY X 7 DAYS THEN 1 CAPSULE 3X DAILY   90 capsule   1   . ibuprofen (ADVIL,MOTRIN) 200 MG tablet   Oral   Take 200 mg by mouth every 6 (six) hours as needed.         Marland Kitchen ibuprofen (ADVIL,MOTRIN) 800 MG tablet   Oral   Take 800 mg by mouth 3 (three) times daily.      0   . lovastatin (MEVACOR) 40 MG tablet   Oral   Take 40 mg by mouth at bedtime.         . meloxicam (MOBIC) 15 MG tablet      TAKE 1 TABLET BY MOUTH EVERY DAY   30 tablet   1   . metformin (FORTAMET) 500 MG (OSM) 24 hr tablet      Recommend taking 2 tablets tonight and after several days increase to 2 tablets at night when The morning and if tolerated then increase to 2 tablets twice a day until  follow-up.   120 tablet   0   . metFORMIN (GLUCOPHAGE-XR) 500 MG 24 hr tablet      TAKE 1 TABLET DAILY   30 tablet   0   . pregabalin (LYRICA) 50 MG capsule   Oral   Take 1 capsule (50 mg total) by mouth 3 (three) times daily.   90 capsule   1   . triamterene-hydrochlorothiazide (MAXZIDE-25) 37.5-25 MG per tablet   Oral   Take 1 tablet by mouth daily.           Allergies Actos; Codeine; Invokana; Levaquin; Lisinopril; Onglyza; Prednisolone; and Tramadol  Family History  Problem Relation Age of Onset  . Colon cancer Maternal Uncle   . Breast cancer Maternal Aunt   . Hypertension Mother   . Diabetes Father     Social History Social History  Substance Use Topics  . Smoking status: Former Smoker -- 0.25 packs/day for 10 years    Types: Cigarettes  . Smokeless tobacco: Never Used  . Alcohol Use: 0.0 oz/week    0 Standard drinks or equivalent per week    Review of Systems  Constitutional: Negative for fever. Positive for hyperglycemia. See history of present illness ENT: Negative for sore throat. Cardiovascular: Negative for chest pain. Respiratory: Negative for cough. Gastrointestinal: Negative for abdominal pain, vomiting and diarrhea. Genitourinary: Negative for dysuria. Musculoskeletal: No myalgias or injuries. Skin: Negative for rash. Neurological: Negative for paresthesia or weakness   10-point ROS otherwise negative.  ____________________________________________   PHYSICAL EXAM:  VITAL SIGNS: ED Triage Vitals  Enc Vitals Group     BP 10/01/15 1841 172/86 mmHg     Pulse Rate 10/01/15 1841 67     Resp 10/01/15 1841 16     Temp 10/01/15 1841 98.8 F (37.1 C)     Temp Source 10/01/15 1841 Oral     SpO2 10/01/15 1841 96 %     Weight 10/01/15 1841 210 lb (95.255 kg)     Height 10/01/15 1841 '5\' 6"'  (1.676 m)     Head Cir --      Peak Flow --      Pain Score --      Pain Loc --      Pain Edu? --      Excl. in [place]? --  Constitutional:  Alert and oriented. Well appearing and in no distress. ENT   Head: Normocephalic and atraumatic.   Nose: No congestion/rhinnorhea.    Cardiovascular: Normal rate, regular rhythm, no murmur noted Respiratory:  Normal respiratory effort, no tachypnea.    Breath sounds are clear and equal bilaterally.  Gastrointestinal: Soft and nontender. No distention.  Back: No muscle spasm, no tenderness, no CVA tenderness. Musculoskeletal: No deformity noted. Nontender with normal range of motion in all extremities.  No noted edema. Neurologic:  Normal speech and language. No gross focal neurologic deficits are appreciated.  Skin:  Skin is warm, dry. No rash noted. Psychiatric: Mood and affect are normal. Speech and behavior are normal.  ____________________________________________    LABS (pertinent positives/negatives)  Labs Reviewed  BASIC METABOLIC PANEL - Abnormal; Notable for the following:    Sodium 133 (*)    Chloride 100 (*)    Glucose, Bld 463 (*)    All other components within normal limits  URINALYSIS COMPLETEWITH MICROSCOPIC ([facility] ONLY) - Abnormal; Notable for the following:    Color, Urine YELLOW (*)    APPearance CLEAR (*)    Glucose, UA >500 (*)    Ketones, ur TRACE (*)    Leukocytes, UA 1+ (*)    Squamous Epithelial / LPF 0-5 (*)    All other components within normal limits  GLUCOSE, CAPILLARY - Abnormal; Notable for the following:    Glucose-Capillary 347 (*)    All other components within normal limits  GLUCOSE, CAPILLARY - Abnormal; Notable for the following:    Glucose-Capillary 341 (*)    All other components within normal limits  CBC  CBG MONITORING, ED     ____________________________________________  ____________________________________________   INITIAL IMPRESSION / ASSESSMENT AND PLAN / ED COURSE  Pertinent labs & imaging results that were available during my care of the patient were reviewed by me and considered in my medical decision making (see  chart for details).   Pleasant 64 year old female with a history of diabetes, generally diet-controlled, now with hyperglycemia with blood sugar levels in the 400s.  Urinalysis yesterday said no sign of infection. She did have 1+ ketones.  I have discussed with her the continuation of metformin, 500 mg twice a day. We will give her a liter of normal saline to dilute the current sugar level and replace fluids that she has likely lost from the hyperglycemia. She is asymptomatic, feels well, looks well. No other treatment appears indicated at this time. A repeat urinalysis for today is pending.  ----------------------------------------- 10:23 PM on 10/01/2015 -----------------------------------------  Urinalysis shows trace ketones. No sign of infection. The patient was treated with 1 L of saline. Her most recent glucose at 2209 was 341. She continues to the asymptomatic and feel well. I've advised her to continue to take metformin, 500 mg twice a day. I've asked her to follow up with her primary physician.  ____________________________________________   FINAL CLINICAL IMPRESSION(S) / ED DIAGNOSES  Final diagnoses:  Hyperglycemia      Ahmed Prima, MD 10/01/15 2227

## 2015-10-01 NOTE — ED Notes (Signed)
Patient comes in with elevated blood sugars.  Patients home glucometer reading 455. Patient with history of diabetes.

## 2015-10-01 NOTE — Discharge Instructions (Signed)
Continue metformin, 500 mg twice a day. Drink plenty of water. He observed and have your diet and avoid simple sugars and starches. Return to the emergency department if you are having any symptoms or urgent concerns. Follow-up with your doctor in 2 days.  Hyperglycemia High blood sugar (hyperglycemia) means that the level of sugar in your blood is higher than it should be. Signs of high blood sugar include:  Feeling thirsty.  Frequent peeing (urinating).  Feeling tired or sleepy.  Dry mouth.  Vision changes.  Feeling weak.  Feeling hungry but losing weight.  Numbness and tingling in your hands or feet.  Headache. When you ignore these signs, your blood sugar may keep going up. These problems may get worse, and other problems may begin. HOME CARE  Check your blood sugars as told by your doctor. Write down the numbers with the date and time.  Take the right amount of insulin or diabetes pills at the right time. Write down the dose with date and time.  Refill your insulin or diabetes pills before running out.  Watch what you eat. Follow your meal plan.  Drink liquids without sugar, such as water. Check with your doctor if you have kidney or heart disease.  Follow your doctor's orders for exercise. Exercise at the same time of day.  Keep your doctor's appointments. GET HELP RIGHT AWAY IF:   You have trouble thinking or are confused.  You have fast breathing with fruity smelling breath.  You pass out (faint).  You have 2 to 3 days of high blood sugars and you do not know why.  You have chest pain.  You are feeling sick to your stomach (nauseous) or throwing up (vomiting).  You have sudden vision changes. MAKE SURE YOU:   Understand these instructions.  Will watch your condition.  Will get help right away if you are not doing well or get worse.   This information is not intended to replace advice given to you by your health care provider. Make sure you discuss  any questions you have with your health care provider.   Document Released: 10/03/2009 Document Revised: 12/27/2014 Document Reviewed: 08/12/2015 Elsevier Interactive Patient Education [employer].

## 2015-10-03 ENCOUNTER — Ambulatory Visit (INDEPENDENT_AMBULATORY_CARE_PROVIDER_SITE_OTHER): Payer: 59 | Admitting: Family Medicine

## 2015-10-03 ENCOUNTER — Encounter: Payer: Self-pay | Admitting: Family Medicine

## 2015-10-03 VITALS — BP 142/86 | HR 86 | Temp 98.8°F | Resp 16 | Ht 64.0 in | Wt 206.3 lb

## 2015-10-03 DIAGNOSIS — Z23 Encounter for immunization: Secondary | ICD-10-CM

## 2015-10-03 DIAGNOSIS — E1169 Type 2 diabetes mellitus with other specified complication: Secondary | ICD-10-CM | POA: Diagnosis not present

## 2015-10-03 DIAGNOSIS — E1165 Type 2 diabetes mellitus with hyperglycemia: Secondary | ICD-10-CM

## 2015-10-03 MED ORDER — EXENATIDE ER 2 MG ~~LOC~~ PEN: PEN_INJECTOR | SUBCUTANEOUS | Status: DC

## 2015-10-03 NOTE — Progress Notes (Signed)
'[]' Name: Morgan Peterson   MRN: [id number]    DOB: 03-31-1951   Date:10/03/2015       Progress Note  Subjective  Chief Complaint  Chief Complaint  Patient presents with  . Diabetes    Blood sugar was 565 on Wednesday . Today 278 at home  . Transition into care     Seen at [facility] on 10/01/2015 and ER on 10/02/2015 for elevated blood sugar    HPI  Diabetes  Patient presents for follow-up of diabetes which is present for several years. Is currently on a regimen of metformin alone. She was on metformin and in the, but stop each stating that she was exercising and following her diet on a regular basis. However she presented to the emergency department with a blood sugar over 500 several days ago after having not taken medication for several weeks. . Patient states he is noncompliant with their diet and exercise. There's been no hypoglycemic episodes and there is polyuria polydipsia polyphagia. His average fasting glucoses been in the low around not being checked at home with a high around 500 in the emergency room . There is no end organ disease.  Last diabetic eye exam was less than one year ago.   Last visit with dietitian was refused. Last microalbumin was obtained July 2016 and was 20 . Marland Kitchen    Past Medical History  Diagnosis Date  . Diabetes mellitus without complication ([place]) 5456  . Hypertension   . Personal history of malignant neoplasm of breast 2010  . IBS (irritable bowel syndrome)   . Cancer [facility]) 2010    Right Breast  . 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 Arimidex therapy completed September 2015    Social History  Substance Use Topics  . Smoking status: Former Smoker -- 0.25 packs/day for 10 years    Types: Cigarettes  . Smokeless tobacco: Never Used  . Alcohol Use: 0.0 oz/week    0  Standard drinks or equivalent per week     Current outpatient prescriptions:  .  BYSTOLIC 10 MG tablet, Take 1 tablet by mouth  daily, Disp: 90 tablet, Rfl: 1 .  calcium carbonate 200 MG capsule, Take 250 mg by mouth 2 (two) times daily with a meal., Disp: , Rfl:  .  cholecalciferol (VITAMIN D) 1000 UNITS tablet, Take 1,000 Units by mouth daily., Disp: , Rfl:  .  cyclobenzaprine (FLEXERIL) 5 MG tablet, 1-2 TABLET, ORAL, EVERY EIGHT HOURS, AS NEEDED, Disp: 60 tablet, Rfl: 1 .  fluconazole (DIFLUCAN) 150 MG tablet, Take 1 tablet (150 mg total) by mouth once., Disp: 2 tablet, Rfl: 0 .  gabapentin (NEURONTIN) 300 MG capsule, TAKE 1 CAPSULE 1X DAILY X 7 DAYS THEN 1 CAPSULE 2X DAILY X 7 DAYS THEN 1 CAPSULE 3X DAILY, Disp: 90 capsule, Rfl: 1 .  ibuprofen (ADVIL,MOTRIN) 800 MG tablet, Take 800 mg by mouth 3 (three) times daily., Disp: , Rfl: 0 .  lovastatin (MEVACOR) 40 MG tablet, Take 40 mg by mouth at bedtime., Disp: , Rfl:  .  meloxicam (MOBIC) 15 MG tablet, TAKE 1 TABLET BY MOUTH EVERY DAY, Disp: 30 tablet, Rfl: 1 .  metformin (FORTAMET) 500 MG (OSM) 24 hr tablet, Recommend taking 2 tablets tonight and after several days increase to 2 tablets at night when The morning  and if tolerated then increase to 2 tablets twice a day until follow-up., Disp: 120 tablet, Rfl: 0 .  triamterene-hydrochlorothiazide (MAXZIDE-25) 37.5-25 MG per tablet, Take 1 tablet by mouth daily., Disp: , Rfl:   Allergies  Allergen Reactions  . Actos [Pioglitazone] Other (See Comments)    Bladder pain  . Codeine   . Invokana [Canagliflozin] Other (See Comments)    Bladder pain  . Levaquin [Levofloxacin In D5w] Swelling  . Lisinopril Swelling    Angioedema  . Onglyza [Saxagliptin] Other (See Comments)    Increased PVC's  . Prednisolone Swelling  . Tramadol Nausea And Vomiting    Review of Systems  Constitutional: Negative.  Negative for fever, chills and weight loss.  HENT: Negative for congestion, hearing loss, sore  throat and tinnitus.   Eyes: Negative for blurred vision, double vision and redness.  Respiratory: Negative for cough, hemoptysis and shortness of breath.   Cardiovascular: Negative for chest pain, palpitations, orthopnea, claudication and leg swelling.  Gastrointestinal: Negative for heartburn, nausea, vomiting, diarrhea, constipation and blood in stool.  Genitourinary: Negative for dysuria, urgency, frequency and hematuria.  Musculoskeletal: Positive for neck pain. Negative for myalgias, back pain, joint pain and falls.       Radicular pain on the right distribution of C6 Right foot pain  Skin: Negative for itching.  Neurological: Negative for dizziness, tingling, tremors, focal weakness, seizures, loss of consciousness, weakness and headaches.  Endo/Heme/Allergies: Does not bruise/bleed easily.  Psychiatric/Behavioral: Negative for depression and substance abuse. The patient is not nervous/anxious and does not have insomnia.      Objective  Filed Vitals:   10/03/15 0952  BP: 142/86  Pulse: 86  Temp: 98.8 F (37.1 C)  TempSrc: Oral  Resp: 16  Height: '5\' 4"'  (1.626 m)  Weight: 206 lb 4.8 oz (93.577 kg)  SpO2: 96%     Physical Exam  Constitutional: She is oriented to person, place, and time and well-developed, well-nourished, and in no distress.  Obese American female in no acute distress  HENT:  Head: Normocephalic.  Eyes: EOM are normal. Pupils are equal, round, and reactive to light.  Neck: Normal range of motion. No thyromegaly present.  Tenderness in the right sternocleidomastoid trapezius and scapular area. Lateral rotation of the C-spine limited to about 60. Lateral bending limited to about 30 secondary to pain. Normal strength.  Cardiovascular: Normal rate, regular rhythm and normal heart sounds.   No murmur heard. Pulmonary/Chest: Effort normal and breath sounds normal.  Abdominal: Soft. Bowel sounds are normal.  Musculoskeletal: Normal range of motion. She  exhibits no edema.  Neurological: She is alert and oriented to person, place, and time. No cranial nerve deficit. Gait normal.  Skin: Skin is warm and dry. No rash noted.  Psychiatric: Memory and affect normal.      Assessment & Plan  1. Type 2 diabetes mellitus with other specified complication (HCC) Uncontrolled - Fructosamine - Exenatide ER (BYDUREON) 2 MG PEN; 1 time a week  Dispense: 4 each; Refill: 4  2. *Need for influenza vaccination Given today - Flu Vaccine QUAD 36+ mos PF IM (Fluarix & Fluzone Quad PF) - Fructosamine  3. Type 2 diabetes mellitus with hyperglycemia, without long-term current use of insulin (HCC) Uncontrolled

## 2015-10-04 LAB — FRUCTOSAMINE: FRUCTOSAMINE: 499 umol/L — AB (ref 0–285)

## 2015-10-08 ENCOUNTER — Other Ambulatory Visit: Payer: Self-pay | Admitting: Emergency Medicine

## 2015-10-08 DIAGNOSIS — E1169 Type 2 diabetes mellitus with other specified complication: Secondary | ICD-10-CM

## 2015-10-09 ENCOUNTER — Encounter: Payer: Self-pay | Admitting: Emergency Medicine

## 2015-10-09 ENCOUNTER — Emergency Department
Admission: EM | Admit: 2015-10-09 | Discharge: 2015-10-09 | Disposition: A | Discharge status: Home / Self Care | Payer: 59 | Attending: Emergency Medicine | Admitting: Emergency Medicine

## 2015-10-09 DIAGNOSIS — Z791 Long term (current) use of non-steroidal anti-inflammatories (NSAID): Secondary | ICD-10-CM | POA: Insufficient documentation

## 2015-10-09 DIAGNOSIS — I10 Essential (primary) hypertension: Secondary | ICD-10-CM | POA: Diagnosis not present

## 2015-10-09 DIAGNOSIS — E1165 Type 2 diabetes mellitus with hyperglycemia: Secondary | ICD-10-CM

## 2015-10-09 DIAGNOSIS — Z79899 Other long term (current) drug therapy: Secondary | ICD-10-CM | POA: Insufficient documentation

## 2015-10-09 DIAGNOSIS — Z87891 Personal history of nicotine dependence: Secondary | ICD-10-CM | POA: Diagnosis not present

## 2015-10-09 LAB — BASIC METABOLIC PANEL
Anion gap: 9 (ref 5–15)
BUN: 16 mg/dL (ref 6–20)
CALCIUM: 9.5 mg/dL (ref 8.9–10.3)
CO2: 27 mmol/L (ref 22–32)
CREATININE: 0.78 mg/dL (ref 0.44–1.00)
Chloride: 97 mmol/L — ABNORMAL LOW (ref 101–111)
GFR calc Af Amer: >60 mL/min (ref >60)
GLUCOSE: 386 mg/dL — AB (ref 65–99)
POTASSIUM: 3.8 mmol/L (ref 3.5–5.1)
SODIUM: 133 mmol/L — AB (ref 135–145)

## 2015-10-09 LAB — CBC
HEMATOCRIT: 41.8 % (ref 35.0–47.0)
Hemoglobin: 13.7 g/dL (ref 12.0–16.0)
MCH: 27.2 pg (ref 26.0–34.0)
MCHC: 32.7 g/dL (ref 32.0–36.0)
MCV: 83.3 fL (ref 80.0–100.0)
PLATELETS: 207 10*3/uL (ref 150–440)
RBC: 5.02 MIL/uL (ref 3.80–5.20)
RDW: 14.4 % (ref 11.5–14.5)
WBC: 6.4 10*3/uL (ref 3.6–11.0)

## 2015-10-09 LAB — URINALYSIS COMPLETE WITH MICROSCOPIC (ARMC ONLY)
BACTERIA UA: NONE SEEN
Bilirubin Urine: NEGATIVE
Hgb urine dipstick: NEGATIVE
KETONES UR: NEGATIVE mg/dL
LEUKOCYTES UA: NEGATIVE
NITRITE: NEGATIVE
PROTEIN: NEGATIVE mg/dL
Specific Gravity, Urine: 1.011 (ref 1.005–1.030)
pH: 7 (ref 5.0–8.0)

## 2015-10-09 LAB — GLUCOSE, CAPILLARY: GLUCOSE-CAPILLARY: 353 mg/dL — AB (ref 65–99)

## 2015-10-09 MED ORDER — ONDANSETRON 8 MG PO TBDP: 8.0000 mg | ORAL_TABLET | Freq: Three times a day (TID) | ORAL | Status: DC | PRN

## 2015-10-09 MED ORDER — SODIUM CHLORIDE 0.9 % IV BOLUS (SEPSIS)
1000.0000 mL | Freq: Once | INTRAVENOUS | Status: AC
  Administered 2015-10-09: 1000 mL via INTRAVENOUS

## 2015-10-09 MED ORDER — ONDANSETRON 4 MG PO TBDP
ORAL_TABLET | ORAL | Status: AC
  Administered 2015-10-09: 4 mg
  Filled 2015-10-09: qty 1

## 2015-10-09 NOTE — Discharge Instructions (Signed)
Blood Glucose Monitoring, Adult Monitoring your blood glucose (also know as blood sugar) helps you to manage your diabetes. It also helps you and your health care provider monitor your diabetes and determine how well your treatment plan is working. WHY SHOULD YOU MONITOR YOUR BLOOD GLUCOSE?  It can help you understand how food, exercise, and medicine affect your blood glucose.  It allows you to know what your blood glucose is at any given moment. You can quickly tell if you are having low blood glucose (hypoglycemia) or high blood glucose (hyperglycemia).  It can help you and your health care provider know how to adjust your medicines.  It can help you understand how to manage an illness or adjust medicine for exercise. WHEN SHOULD YOU TEST? Your health care provider will help you decide how often you should check your blood glucose. This may depend on the type of diabetes you have, your diabetes control, or the types of medicines you are taking. Be sure to write down all of your blood glucose readings so that this information can be reviewed with your health care provider. See below for examples of testing times that your health care provider may suggest. Type 1 Diabetes  Test at least 2 times per day if your diabetes is well controlled, if you are using an insulin pump, or if you perform multiple daily injections.  If your diabetes is not well controlled or if you are sick, you may need to test more often.  It is a good idea to also test:  Before every insulin injection.  Before and after exercise.  Between meals and 2 hours after a meal.  Occasionally between 2:00 a.m. and 3:00 a.m. Type 2 Diabetes  If you are taking insulin, test at least 2 times per day. However, it is best to test before every insulin injection.  If you take medicines by mouth (orally), test 2 times a day.  If you are on a controlled diet, test once a day.  If your diabetes is not well controlled or if you  are sick, you may need to monitor more often. HOW TO MONITOR YOUR BLOOD GLUCOSE Supplies Needed  Blood glucose meter.  Test strips for your meter. Each meter has its own strips. You must use the strips that go with your own meter.  A pricking needle (lancet).  A device that holds the lancet (lancing device).  A journal or log book to write down your results. Procedure  Wash your hands with soap and water. Alcohol is not preferred.  Prick the side of your finger (not the tip) with the lancet.  Gently milk the finger until a small drop of blood appears.  Follow the instructions that come with your meter for inserting the test strip, applying blood to the strip, and using your blood glucose meter. Other Areas to Get Blood for Testing Some meters allow you to use other areas of your body (other than your finger) to test your blood. These areas are called alternative sites. The most common alternative sites are:  The forearm.  The thigh.  The back area of the lower leg.  The palm of the hand. The blood flow in these areas is slower. Therefore, the blood glucose values you get may be delayed, and the numbers are different from what you would get from your fingers. Do not use alternative sites if you think you are having hypoglycemia. Your reading will not be accurate. Always use a finger if you are  having hypoglycemia. Also, if you cannot feel your lows (hypoglycemia unawareness), always use your fingers for your blood glucose checks. ADDITIONAL TIPS FOR GLUCOSE MONITORING  Do not reuse lancets.  Always carry your supplies with you.  All blood glucose meters have a 24-hour "hotline" number to call if you have questions or need help.  Adjust (calibrate) your blood glucose meter with a control solution after finishing a few boxes of strips. BLOOD GLUCOSE RECORD KEEPING It is a good idea to keep a daily record or log of your blood glucose readings. Most glucose meters, if not all,  keep your glucose records stored in the meter. Some meters come with the ability to download your records to your home computer. Keeping a record of your blood glucose readings is especially helpful if you are wanting to look for patterns. Make notes to go along with the blood glucose readings because you might forget what happened at that exact time. Keeping good records helps you and your health care provider to work together to achieve good diabetes management.    This information is not intended to replace advice given to you by your health care provider. Make sure you discuss any questions you have with your health care provider.   Document Released: 12/09/2003 Document Revised: 12/27/2014 Document Reviewed: 04/30/2013 Elsevier Interactive Patient Education 2016 [employer].  Diabetes and Exercise Exercising regularly is important. It is not just about losing weight. It has many health benefits, such as:  Improving your overall fitness, flexibility, and endurance.  Increasing your bone density.  Helping with weight control.  Decreasing your body fat.  Increasing your muscle strength.  Reducing stress and tension.  Improving your overall health. People with diabetes who exercise gain additional benefits because exercise:  Reduces appetite.  Improves the body's use of blood sugar (glucose).  Helps lower or control blood glucose.  Decreases blood pressure.  Helps control blood lipids (such as cholesterol and triglycerides).  Improves the body's use of the hormone insulin by:  Increasing the body's insulin sensitivity.  Reducing the body's insulin needs.  Decreases the risk for heart disease because exercising:  Lowers cholesterol and triglycerides levels.  Increases the levels of good cholesterol (such as high-density lipoproteins [HDL]) in the body.  Lowers blood glucose levels. YOUR ACTIVITY PLAN  Choose an activity that you enjoy, and set realistic goals. To  exercise safely, you should begin practicing any new physical activity slowly, and gradually increase the intensity of the exercise over time. Your health care provider or diabetes educator can help create an activity plan that works for you. General recommendations include:  Encouraging children to engage in at least 60 minutes of physical activity each day.  Stretching and performing strength training exercises, such as yoga or weight lifting, at least 2 times per week.  Performing a total of at least 150 minutes of moderate-intensity exercise each week, such as brisk walking or water aerobics.  Exercising at least 3 days per week, making sure you allow no more than 2 consecutive days to pass without exercising.  Avoiding long periods of inactivity (90 minutes or more). When you have to spend an extended period of time sitting down, take frequent breaks to walk or stretch. RECOMMENDATIONS FOR EXERCISING WITH TYPE 1 OR TYPE 2 DIABETES   Check your blood glucose before exercising. If blood glucose levels are greater than 240 mg/dL, check for urine ketones. Do not exercise if ketones are present.  Avoid injecting insulin into areas of the  body that are going to be exercised. For example, avoid injecting insulin into:  The arms when playing tennis.  The legs when jogging.  Keep a record of:  Food intake before and after you exercise.  Expected peak times of insulin action.  Blood glucose levels before and after you exercise.  The type and amount of exercise you have done.  Review your records with your health care provider. Your health care provider will help you to develop guidelines for adjusting food intake and insulin amounts before and after exercising.  If you take insulin or oral hypoglycemic agents, watch for signs and symptoms of hypoglycemia. They include:  Dizziness.  Shaking.  Sweating.  Chills.  Confusion.  Drink plenty of water while you exercise to prevent  dehydration or heat stroke. Body water is lost during exercise and must be replaced.  Talk to your health care provider before starting an exercise program to make sure it is safe for you. Remember, almost any type of activity is better than none.   This information is not intended to replace advice given to you by your health care provider. Make sure you discuss any questions you have with your health care provider.   Document Released: 02/26/2004 Document Revised: 04/22/2015 Document Reviewed: 05/15/2013 Elsevier Interactive Patient Education 2016 Elsevier [employer]  Hyperglycemia High blood sugar (hyperglycemia) means that the level of sugar in your blood is higher than it should be. Signs of high blood sugar include:  Feeling thirsty.  Frequent peeing (urinating).  Feeling tired or sleepy.  Dry mouth.  Vision changes.  Feeling weak.  Feeling hungry but losing weight.  Numbness and tingling in your hands or feet.  Headache. When you ignore these signs, your blood sugar may keep going up. These problems may get worse, and other problems may begin. HOME CARE  Check your blood sugars as told by your doctor. Write down the numbers with the date and time.  Take the right amount of insulin or diabetes pills at the right time. Write down the dose with date and time.  Refill your insulin or diabetes pills before running out.  Watch what you eat. Follow your meal plan.  Drink liquids without sugar, such as water. Check with your doctor if you have kidney or heart disease.  Follow your doctor's orders for exercise. Exercise at the same time of day.  Keep your doctor's appointments. GET HELP RIGHT AWAY IF:   You have trouble thinking or are confused.  You have fast breathing with fruity smelling breath.  You pass out (faint).  You have 2 to 3 days of high blood sugars and you do not know why.  You have chest pain.  You are feeling sick to your stomach (nauseous) or  throwing up (vomiting).  You have sudden vision changes. MAKE SURE YOU:   Understand these instructions.  Will watch your condition.  Will get help right away if you are not doing well or get worse.   This information is not intended to replace advice given to you by your health care provider. Make sure you discuss any questions you have with your health care provider.   Document Released: 10/03/2009 Document Revised: 12/27/2014 Document Reviewed: 08/12/2015 Elsevier Interactive Patient Education [employer].

## 2015-10-09 NOTE — ED Notes (Signed)
Pt recently started back on meformin and is making her have headaches and some nausea. Pt states her blood sugar is high today. Was 332 at 12pm today at home.

## 2015-10-09 NOTE — ED Notes (Signed)
Resumed care from [facility].  Iv fluids infusing.  Pt watching tv.  Alert.  Skin warm and dry.  Pt continues  To have abd discomfort and nausea.

## 2015-10-09 NOTE — ED Provider Notes (Signed)
[facility] Emergency Department Provider Note  ____________________________________________  Time seen: 1:45 PM  I have reviewed the triage vital signs and the nursing notes.   HISTORY  Chief Complaint Hyperglycemia    HPI Morgan Peterson is a 64 y.o. female who complains of headaches and nausea and high blood sugar. She has diabetes with long-term difficulty managing her blood sugars due to intolerance of oral hypoglycemics.    Past several months she has not been on any oral medications, and she did begin stress eating as well due to severe illness in her family and therefore is noncompliant with medication and diet recommendations.   She recently started back on metformin about a week ago ,but since then has had headaches and nausea after taking it. She's had difficulty adhering to the regimen, and over the past week has also had polyuria and polydipsia. Today her blood sugar was 3:30 at home so she presents for evaluation. Patient was just evaluated one week ago for hyperglycemia as well.  No chest pain shortness of breath cough fever chills dysuria frequency urgency. No trauma   Past Medical History  Diagnosis Date  . Diabetes mellitus without complication ([place]) 2706  . Hypertension   . Personal history of malignant neoplasm of breast 2010  . IBS (irritable bowel syndrome)   . Cancer [facility]) 2010    Right Breast  . 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 Arimidex therapy completed September 2015     Patient Active Problem List   Diagnosis Date Noted  . Cervical radiculopathy at C6 07/31/2015  . Neuroma digital nerve 07/31/2015  . Type 2 diabetes mellitus without complication ([place]) 23/76/2831  . Right shoulder pain 06/30/2015  . Cervical disc disease 06/30/2015  .  Essential hypertension 06/30/2015  . Breast cancer ([place]) 01/07/2014     Past Surgical History  Procedure Laterality Date  . Abdominal exploration surgery  2000  . Colonoscopy  2003, 2013    Dr Alveta Heimlich, Dr. Bary Castilla  . Tubal ligation  20 years ago  . Breast lumpectomy Right 2010  . Breast biopsy Right 2010     Current Outpatient Rx  Name  Route  Sig  Dispense  Refill  . BYSTOLIC 10 MG tablet      Take 1 tablet by mouth  daily   90 tablet   1   . calcium carbonate 200 MG capsule   Oral   Take 250 mg by mouth 2 (two) times daily with a meal.         . cholecalciferol (VITAMIN D) 1000 UNITS tablet   Oral   Take 1,000 Units by mouth daily.         . cyclobenzaprine (FLEXERIL) 5 MG tablet      1-2 TABLET, ORAL, EVERY EIGHT HOURS, AS NEEDED   60 tablet   1   . Exenatide ER (BYDUREON) 2 MG PEN      1 time a week   4 each   4   . fluconazole (DIFLUCAN) 150 MG tablet   Oral   Take 1 tablet (150 mg total) by mouth once.   2 tablet   0   . gabapentin (NEURONTIN) 300 MG capsule      TAKE 1 CAPSULE 1X DAILY X 7 DAYS THEN 1 CAPSULE 2X DAILY  X 7 DAYS THEN 1 CAPSULE 3X DAILY   90 capsule   1   . ibuprofen (ADVIL,MOTRIN) 800 MG tablet   Oral   Take 800 mg by mouth 3 (three) times daily.      0   . lovastatin (MEVACOR) 40 MG tablet   Oral   Take 40 mg by mouth at bedtime.         . meloxicam (MOBIC) 15 MG tablet      TAKE 1 TABLET BY MOUTH EVERY DAY   30 tablet   1   . metformin (FORTAMET) 500 MG (OSM) 24 hr tablet      Recommend taking 2 tablets tonight and after several days increase to 2 tablets at night when The morning and if tolerated then increase to 2 tablets twice a day until follow-up.   120 tablet   0   . ondansetron (ZOFRAN ODT) 8 MG disintegrating tablet   Oral   Take 1 tablet (8 mg total) by mouth every 8 (eight) hours as needed for nausea or vomiting.   20 tablet   0   . triamterene-hydrochlorothiazide (MAXZIDE-25) 37.5-25 MG per  tablet   Oral   Take 1 tablet by mouth daily.            Allergies Actos; Codeine; Invokana; Levaquin; Lisinopril; Onglyza; Prednisolone; and Tramadol   Family History  Problem Relation Age of Onset  . Colon cancer Maternal Uncle   . Breast cancer Maternal Aunt   . Hypertension Mother   . Diabetes Father     Social History Social History  Substance Use Topics  . Smoking status: Former Smoker -- 0.25 packs/day for 10 years    Types: Cigarettes  . Smokeless tobacco: Never Used  . Alcohol Use: 0.0 oz/week    0 Standard drinks or equivalent per week    Review of Systems  Constitutional:   No fever or chills. No weight changes Eyes:   No blurry vision or double vision.  ENT:   No sore throat. Cardiovascular:   No chest pain. Respiratory:   No dyspnea or cough. Gastrointestinal:   Negative for abdominal pain, vomiting and diarrhea.  No BRBPR or melena. Genitourinary:   Negative for dysuria, urinary retention, bloody urine, or difficulty urinating. Musculoskeletal:   Negative for back pain. No joint swelling or pain. Skin:   Negative for rash. Neurological:   Positive for headaches, without focal weakness or numbness. Psychiatric:  No anxiety or depression.   Endocrine:  No hot/cold intolerance, changes in energy, or sleep difficulty.  10-point ROS otherwise negative.  ____________________________________________   PHYSICAL EXAM:  VITAL SIGNS: ED Triage Vitals  Enc Vitals Group     BP 10/09/15 1259 173/75 mmHg     Pulse Rate 10/09/15 1259 71     Resp 10/09/15 1259 20     Temp 10/09/15 1259 98.3 F (36.8 C)     Temp src --      SpO2 10/09/15 1259 97 %     Weight 10/09/15 1259 206 lb (93.441 kg)     Height 10/09/15 1259 '5\' 6"'  (1.676 m)     Head Cir --      Peak Flow --      Pain Score 10/09/15 1306 3     Pain Loc --      Pain Edu? --      Excl. in [place]? --      Constitutional:   Alert and oriented. Well appearing and in no  distress. Eyes:   No scleral  icterus. No conjunctival pallor. PERRL. EOMI ENT   Head:   Normocephalic and atraumatic.   Nose:   No congestion/rhinnorhea. No septal hematoma   Mouth/Throat:   MMM, no pharyngeal erythema. No peritonsillar mass. No uvula shift.   Neck:   No stridor. No SubQ emphysema. No meningismus. Hematological/Lymphatic/Immunilogical:   No cervical lymphadenopathy. Cardiovascular:   RRR. Normal and symmetric distal pulses are present in all extremities. No murmurs, rubs, or gallops. Respiratory:   Normal respiratory effort without tachypnea nor retractions. Breath sounds are clear and equal bilaterally. No wheezes/rales/rhonchi. Gastrointestinal:   Soft and nontender. No distention. There is no CVA tenderness.  No rebound, rigidity, or guarding. Genitourinary:   deferred Musculoskeletal:   Nontender with normal range of motion in all extremities. No joint effusions.  No lower extremity tenderness.  No edema. Neurologic:   Normal speech and language.  CN 2-10 normal. Motor grossly intact. No pronator drift.  Normal gait. No gross focal neurologic deficits are appreciated.  Skin:    Skin is warm, dry and intact. No rash noted.  No petechiae, purpura, or bullae. Psychiatric:   Mood and affect are normal. Speech and behavior are normal. Patient exhibits appropriate insight and judgment.  ____________________________________________    LABS (pertinent positives/negatives) (all labs ordered are listed, but only abnormal results are displayed) Labs Reviewed  GLUCOSE, CAPILLARY - Abnormal; Notable for the following:    Glucose-Capillary 353 (*)    All other components within normal limits  BASIC METABOLIC PANEL - Abnormal; Notable for the following:    Sodium 133 (*)    Chloride 97 (*)    Glucose, Bld 386 (*)    All other components within normal limits  URINALYSIS COMPLETEWITH MICROSCOPIC ([facility] ONLY) - Abnormal; Notable for the following:    Color, Urine STRAW (*)    APPearance CLEAR  (*)    Glucose, UA >500 (*)    Squamous Epithelial / LPF 0-5 (*)    All other components within normal limits  CBC  CBG MONITORING, ED   ____________________________________________   EKG    ____________________________________________    RADIOLOGY    ____________________________________________   PROCEDURES   ____________________________________________   INITIAL IMPRESSION / ASSESSMENT AND PLAN / ED COURSE  Pertinent labs & imaging results that were available during my care of the patient were reviewed by me and considered in my medical decision making (see chart for details).  Patient presents with hyperglycemia due to diet and medication noncompliance. Stressed the importance of taking the metformin to the patient and that it typically causes some GI upset in the first few days to week of taking it and that she should stick with it if at all possible and that the adverse symptoms will resolve. No ketosis or acidosis, not significantly dehydrated, IV fluids given. Nontoxic well-appearing, suitable for outpatient follow-up.     ____________________________________________   FINAL CLINICAL IMPRESSION(S) / ED DIAGNOSES  Final diagnoses:  Type 2 diabetes mellitus with hyperglycemia, without long-term current use of insulin (HCC)      Carrie Mew, MD 10/09/15 1538

## 2015-10-15 ENCOUNTER — Other Ambulatory Visit: Payer: Self-pay | Admitting: Family Medicine

## 2015-10-28 ENCOUNTER — Other Ambulatory Visit: Payer: Self-pay | Admitting: Family Medicine

## 2015-11-01 ENCOUNTER — Other Ambulatory Visit: Payer: Self-pay | Admitting: Family Medicine

## 2015-11-04 ENCOUNTER — Encounter: Payer: Self-pay | Admitting: Family Medicine

## 2015-11-04 ENCOUNTER — Ambulatory Visit (INDEPENDENT_AMBULATORY_CARE_PROVIDER_SITE_OTHER): Payer: 59 | Admitting: Family Medicine

## 2015-11-04 DIAGNOSIS — E119 Type 2 diabetes mellitus without complications: Secondary | ICD-10-CM | POA: Diagnosis not present

## 2015-11-04 DIAGNOSIS — E785 Hyperlipidemia, unspecified: Secondary | ICD-10-CM

## 2015-11-04 DIAGNOSIS — F411 Generalized anxiety disorder: Secondary | ICD-10-CM | POA: Insufficient documentation

## 2015-11-04 DIAGNOSIS — M5412 Radiculopathy, cervical region: Secondary | ICD-10-CM | POA: Diagnosis not present

## 2015-11-04 DIAGNOSIS — E1169 Type 2 diabetes mellitus with other specified complication: Secondary | ICD-10-CM | POA: Insufficient documentation

## 2015-11-04 DIAGNOSIS — I10 Essential (primary) hypertension: Secondary | ICD-10-CM | POA: Diagnosis not present

## 2015-11-04 DIAGNOSIS — F418 Other specified anxiety disorders: Secondary | ICD-10-CM | POA: Diagnosis not present

## 2015-11-04 MED ORDER — LORAZEPAM 0.5 MG PO TABS: 0.5000 mg | ORAL_TABLET | Freq: Two times a day (BID) | ORAL | Status: DC | PRN

## 2015-11-04 NOTE — Progress Notes (Signed)
Name: Morgan Peterson   MRN: [id number]    DOB: [birth year]   Date:11/04/2015       Progress Note  Subjective  Chief Complaint  Chief Complaint  Patient presents with  . Diabetes    1 month follow up. Followed by Endocrinology Dr. Paulita Cradle at [facility], glucose 88-123    HPI  Diabetes  Patient is currently O the care of endocrinologist Dr. Kathlene Cote at: [place] [facility]. She is currently on a regimen of 24 units of Lantus daily at bedtime and sliding scale NovoLog. Blood sugars have been in the 88-123 range. She is much more compliant with her medications as well as diet and exercise currently.   Anxiety disorder  Patient complains last several months of having anxiety symptoms and similar panic attacks. She is taking her cousins Xanax or Ativan on occasion has had relief of symptomatology. There is some Indigestion. She has few areas of outlet for expression of her anxiety. She is currently working as a Higher education careers adviser and is under number of stressors for surveillance.  Hyperlipidemia  Patient currently on lovastatin 40 mg by mouth daily at bedtime. She's not had a recent lipid panel within the last 6-8 months. There is no myalgias or other symptomatology.  Hypertension  Patient currently Bystolic 10 mg daily along with Maxide 25 mg daily. She is statin intolerant   Past Medical History  Diagnosis Date  . Diabetes mellitus without complication ([place]) 4536  . Hypertension   . Personal history of malignant neoplasm of breast 2010  . IBS (irritable bowel syndrome)   . Cancer [facility]) 2010    Right Breast  . 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 Arimidex therapy completed September 2015    Social History  Substance Use Topics  . Smoking status: Former Smoker -- 0.25 packs/day for 10 years    Types: Cigarettes   . Smokeless tobacco: Never Used  . Alcohol Use: 0.0 oz/week    0 Standard drinks or equivalent per week     Current outpatient prescriptions:  .  insulin aspart (NOVOLOG) 100 UNIT/ML FlexPen, Inject into the skin., Disp: , Rfl:  .  insulin glargine (LANTUS) 100 UNIT/ML injection, Inject 24 Units into the skin at bedtime., Disp: , Rfl:  .  BYSTOLIC 10 MG tablet, Take 1 tablet by mouth  daily, Disp: 90 tablet, Rfl: 1 .  calcium carbonate 200 MG capsule, Take 250 mg by mouth 2 (two) times daily with a meal., Disp: , Rfl:  .  cholecalciferol (VITAMIN D) 1000 UNITS tablet, Take 1,000 Units by mouth daily., Disp: , Rfl:  .  cyclobenzaprine (FLEXERIL) 5 MG tablet, 1-2 TABLET, ORAL, EVERY EIGHT HOURS, AS NEEDED, Disp: 60 tablet, Rfl: 1 .  fluconazole (DIFLUCAN) 150 MG tablet, Take 1 tablet (150 mg total) by mouth once., Disp: 2 tablet, Rfl: 0 .  gabapentin (NEURONTIN) 300 MG capsule, TAKE 1 CAPSULE 1X DAILY X 7 DAYS THEN 1 CAPSULE 2X DAILY X 7 DAYS THEN 1 CAPSULE 3X DAILY, Disp: 90 capsule, Rfl: 1 .  ibuprofen (ADVIL,MOTRIN) 800 MG tablet, Take 800 mg by mouth 3 (three) times daily., Disp: , Rfl: 0 .  lovastatin (MEVACOR) 40 MG tablet, Take 1 tablet by mouth  every night at bedtime, Disp: 90 tablet, Rfl: 1 .  meloxicam (MOBIC) 15 MG  tablet, TAKE 1 TABLET BY MOUTH EVERY DAY, Disp: 30 tablet, Rfl: 1 .  ondansetron (ZOFRAN ODT) 8 MG disintegrating tablet, Take 1 tablet (8 mg total) by mouth every 8 (eight) hours as needed for nausea or vomiting., Disp: 20 tablet, Rfl: 0 .  triamterene-hydrochlorothiazide (MAXZIDE-25) 37.5-25 MG per tablet, Take 1 tablet by mouth daily., Disp: , Rfl:   Allergies  Allergen Reactions  . Actos [Pioglitazone] Other (See Comments)    Bladder pain  . Codeine   . Invokana [Canagliflozin] Other (See Comments)    Bladder pain  . Levaquin [Levofloxacin In D5w] Swelling  . Lisinopril Swelling    Angioedema  . Onglyza [Saxagliptin] Other (See Comments)    Increased PVC's   . Prednisolone Swelling  . Tramadol Nausea And Vomiting    Review of Systems  Constitutional: Negative for fever, chills and weight loss.  HENT: Negative for congestion, hearing loss, sore throat and tinnitus.   Eyes: Negative for blurred vision, double vision and redness.  Respiratory: Negative for cough, hemoptysis and shortness of breath.   Cardiovascular: Negative for chest pain, palpitations, orthopnea, claudication and leg swelling.  Gastrointestinal: Negative for heartburn, nausea, vomiting, diarrhea, constipation and blood in stool.  Genitourinary: Negative for dysuria, urgency, frequency and hematuria.  Musculoskeletal: Negative for myalgias, back pain, joint pain, falls and neck pain.  Skin: Negative for itching.  Neurological: Negative for dizziness, tingling, tremors, focal weakness, seizures, loss of consciousness, weakness and headaches.  Endo/Heme/Allergies: Does not bruise/bleed easily.  Psychiatric/Behavioral: Negative for depression and substance abuse. The patient is not nervous/anxious and does not have insomnia.      Objective  Filed Vitals:   11/04/15 0858  BP: 126/80  Pulse: 73  Temp: 98.7 F (37.1 C)  TempSrc: Oral  Resp: 16  Height: '5\' 6"'  (1.676 m)  Weight: 206 lb 3.2 oz (93.532 kg)  SpO2: 96%     Physical Exam  Constitutional: She is oriented to person, place, and time and well-developed, well-nourished, and in no distress.  HENT:  Head: Normocephalic.  Eyes: EOM are normal. Pupils are equal, round, and reactive to light.  Neck: Normal range of motion. No thyromegaly present.  Cardiovascular: Normal rate, regular rhythm and normal heart sounds.   No murmur heard. Pulmonary/Chest: Effort normal and breath sounds normal.  Abdominal: Soft. Bowel sounds are normal.  Musculoskeletal: Normal range of motion. She exhibits no edema.  Neurological: She is alert and oriented to person, place, and time. No cranial nerve deficit. Gait normal.  Skin:  Skin is warm and dry. No rash noted.  Psychiatric: Memory and affect normal.      Assessment & Plan  1. Type 2 diabetes mellitus without complication, without long-term current use of insulin ([place]) Followed by endocrinologist  2. Essential hypertension Well-controlled  3. Other specified anxiety disorders Trial lorazepam - LORazepam (ATIVAN) 0.5 MG tablet; Take 1 tablet (0.5 mg total) by mouth 2 (two) times daily as needed for anxiety.  Dispense: 30 tablet; Refill: 1  4. Cervical radiculopathy at C6 Continue current regimen  5. Hyperlipidemia Lab work - Lipid panel

## 2015-11-05 ENCOUNTER — Telehealth: Payer: Self-pay | Admitting: Emergency Medicine

## 2015-11-05 LAB — LIPID PANEL
CHOLESTEROL TOTAL: 146 mg/dL (ref 100–199)
Chol/HDL Ratio: 3.1 ratio units (ref 0.0–4.4)
HDL: 47 mg/dL (ref >39)
LDL CALC: 86 mg/dL (ref 0–99)
TRIGLYCERIDES: 67 mg/dL (ref 0–149)
VLDL CHOLESTEROL CAL: 13 mg/dL (ref 5–40)

## 2015-11-05 NOTE — Telephone Encounter (Signed)
Patient notified of labs.   

## 2015-11-12 ENCOUNTER — Encounter: Payer: Self-pay | Admitting: *Deleted

## 2015-11-14 ENCOUNTER — Other Ambulatory Visit: Payer: Self-pay | Admitting: Family Medicine

## 2015-11-14 LAB — URINE CULTURE

## 2015-11-17 NOTE — Telephone Encounter (Signed)
I am forwarding this encounter to the designated PCP and/or their nursing staff for further management of the tasks requested. Thank you.  

## 2015-12-24 ENCOUNTER — Other Ambulatory Visit: Payer: Self-pay | Admitting: Family Medicine

## 2015-12-30 ENCOUNTER — Encounter: Payer: Self-pay | Admitting: General Surgery

## 2016-01-06 ENCOUNTER — Telehealth: Payer: Self-pay | Admitting: Family Medicine

## 2016-01-06 ENCOUNTER — Ambulatory Visit: Payer: 59 | Admitting: General Surgery

## 2016-01-06 NOTE — Telephone Encounter (Signed)
Patient is requesting a refill on all her dm supplies please [facility]. Also need ibuprofen 800mg  (completely out) please send to [facility]

## 2016-01-07 MED ORDER — IBUPROFEN 800 MG PO TABS: 800.0000 mg | ORAL_TABLET | Freq: Three times a day (TID) | ORAL | Status: DC

## 2016-01-07 MED ORDER — GLUCOSE BLOOD VI STRP: ORAL_STRIP | Status: DC

## 2016-01-07 MED ORDER — ONETOUCH ULTRASOFT LANCETS MISC: Status: AC

## 2016-01-07 NOTE — Telephone Encounter (Signed)
Scripts sent

## 2016-01-09 ENCOUNTER — Other Ambulatory Visit: Payer: Self-pay | Admitting: Family Medicine

## 2016-01-09 MED ORDER — GLUCOSE BLOOD VI STRP: ORAL_STRIP | Status: DC

## 2016-01-09 MED ORDER — ONETOUCH ULTRA SYSTEM W/DEVICE KIT: 1.0000 | PACK | Freq: Once | Status: DC

## 2016-01-12 ENCOUNTER — Ambulatory Visit (INDEPENDENT_AMBULATORY_CARE_PROVIDER_SITE_OTHER): Payer: 59 | Admitting: General Surgery

## 2016-01-12 ENCOUNTER — Encounter: Payer: Self-pay | Admitting: General Surgery

## 2016-01-12 ENCOUNTER — Other Ambulatory Visit: Payer: Self-pay | Admitting: Family Medicine

## 2016-01-12 VITALS — BP 160/80 | HR 74 | Resp 14 | Ht 66.0 in | Wt 210.0 lb

## 2016-01-12 DIAGNOSIS — E1169 Type 2 diabetes mellitus with other specified complication: Secondary | ICD-10-CM

## 2016-01-12 DIAGNOSIS — C50911 Malignant neoplasm of unspecified site of right female breast: Secondary | ICD-10-CM | POA: Diagnosis not present

## 2016-01-12 MED ORDER — ONETOUCH VERIO W/DEVICE KIT: 1.0000 | PACK | Freq: Once | Status: AC

## 2016-01-12 MED ORDER — GLUCOSE BLOOD VI STRP: ORAL_STRIP | Status: DC

## 2016-01-12 NOTE — Progress Notes (Signed)
Patient ID: Morgan Peterson, female   DOB: 08-26-1951, 65 y.o.   MRN: [id number]  Chief Complaint  Patient presents with  . Follow-up    mammogram    HPI Morgan Peterson is a 64 y.o. female who presents for a breast evaluation. The most recent mammogram was done on 12/30/15.  Patient does perform regular self breast checks and gets regular mammograms done.    I personally reviewed the patient's history.  HPI  Past Medical History  Diagnosis Date  . Diabetes mellitus without complication ([place]) 6063  . Hypertension   . Personal history of malignant neoplasm of breast 2010  . IBS (irritable bowel syndrome)   . Cancer [facility]) 2010    Right Breast  . 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 Arimidex therapy completed September 2015    Past Surgical History  Procedure Laterality Date  . Abdominal exploration surgery  2000  . Colonoscopy  2003, 2013    Dr Alveta Heimlich, Dr. Bary Castilla  . Tubal ligation  20 years ago  . Breast lumpectomy Right 2010  . Breast biopsy Right 2010    Family History  Problem Relation Age of Onset  . Colon cancer Maternal Uncle   . Breast cancer Maternal Aunt   . Hypertension Mother   . Diabetes Father     Social History Social History  Substance Use Topics  . Smoking status: Former Smoker -- 0.25 packs/day for 10 years    Types: Cigarettes  . Smokeless tobacco: Never Used  . Alcohol Use: 0.0 oz/week    0 Standard drinks or equivalent per week    Allergies  Allergen Reactions  . Actos [Pioglitazone] Other (See Comments)    Bladder pain  . Codeine   . Invokana [Canagliflozin] Other (See Comments)    Bladder pain  . Levaquin [Levofloxacin In D5w] Swelling  . Lisinopril Swelling    Angioedema  . Metformin And Related Nausea Only  . Onglyza [Saxagliptin] Other (See Comments)     Increased PVC's  . Prednisolone Swelling  . Tramadol Nausea And Vomiting    Current Outpatient Prescriptions  Medication Sig Dispense Refill  . BYSTOLIC 10 MG tablet Take 1 tablet by mouth  daily 90 tablet 1  . calcium carbonate 200 MG capsule Take 250 mg by mouth 2 (two) times daily with a meal.    . cholecalciferol (VITAMIN D) 1000 UNITS tablet Take 1,000 Units by mouth daily.    . cyclobenzaprine (FLEXERIL) 5 MG tablet 1-2 TABLET, ORAL, EVERY EIGHT HOURS, AS NEEDED 60 tablet 1  . ibuprofen (ADVIL,MOTRIN) 800 MG tablet Take 1 tablet (800 mg total) by mouth 3 (three) times daily. 90 tablet 0  . insulin aspart (NOVOLOG) 100 UNIT/ML FlexPen Inject into the skin.    Marland Kitchen insulin glargine (LANTUS) 100 UNIT/ML injection Inject 24 Units into the skin at bedtime.    . Lancets (ONETOUCH ULTRASOFT) lancets Use as instructed 100 each 3  . LORazepam (ATIVAN) 0.5 MG tablet Take 1 tablet (0.5 mg total) by mouth 2 (two) times daily as needed for anxiety. 30 tablet 1  . lovastatin (MEVACOR) 40 MG tablet Take 1 tablet by mouth  every night at bedtime 90 tablet 1  . meloxicam (MOBIC) 15 MG tablet TAKE 1 TABLET BY MOUTH EVERY DAY 30 tablet 1  . triamterene-hydrochlorothiazide (  MAXZIDE-25) 37.5-25 MG tablet Take 1 tablet by mouth  daily 90 tablet 1  . Blood Glucose Monitoring Suppl (ONETOUCH VERIO) w/Device KIT 1 Device by Does not apply route once. 1 kit 0  . glucose blood (ONETOUCH VERIO) test strip Use as instructed 100 each 12   No current facility-administered medications for this visit.    Review of Systems Review of Systems  Constitutional: Negative.   Respiratory: Negative.   Cardiovascular: Negative.     Blood pressure 160/80, pulse 74, resp. rate 14, height _0  (1.676 m), weight 210 lb (95.255 kg).  Physical Exam Physical Exam  Constitutional: She is oriented to person, place, and time. She appears well-developed and well-nourished.  Eyes: Conjunctivae are normal. No scleral icterus.   Neck: Neck supple.  Cardiovascular: Normal rate, regular rhythm and normal heart sounds.   Pulmonary/Chest: Effort normal and breath sounds normal. Right breast exhibits no inverted nipple, no mass, no nipple discharge, no skin change and no tenderness. Left breast exhibits no inverted nipple, no mass, no nipple discharge, no skin change and no tenderness.    Lymphadenopathy:    She has no cervical adenopathy.    She has no axillary adenopathy.  Neurological: She is alert and oriented to person, place, and time.  Skin: Skin is warm and dry.    Data Reviewed Bilateral mammogram dated 12/30/2015 completed at [facility] were reviewed. No distinct interval change. BI-RADS-2.  Assessment    Doing well now 6 years status post treatment of a stage I carcinoma of the right breast.    Plan         The patient has been asked to return to the office in one year with a bilateral diagnostic mammogram. This information has been scribed by Gaspar Cola CMA. PCP:  Beryle Beams 01/13/2016, 5:30 PM

## 2016-01-12 NOTE — Telephone Encounter (Signed)
Patient came into office and stated insurance will not cover Onetouch or Aviva test monitors. Need script and strips sen to [facility]. Per Dr. Rutherford Nail script and machine sent

## 2016-01-12 NOTE — Patient Instructions (Signed)
The patient has been asked to return to the office in one year with a bilateral diagnostic mammogram. 

## 2016-02-05 ENCOUNTER — Ambulatory Visit: Payer: 59 | Admitting: Family Medicine

## 2016-03-04 ENCOUNTER — Other Ambulatory Visit: Payer: Self-pay | Admitting: Family Medicine

## 2016-03-04 MED ORDER — LOVASTATIN 40 MG PO TABS: ORAL_TABLET | ORAL | Status: DC

## 2016-03-04 NOTE — Telephone Encounter (Signed)
Patient called and needed a 30 day supply of cholesterol medication until mail order get processed and sent. Script sent to [place]

## 2016-03-23 ENCOUNTER — Other Ambulatory Visit: Payer: Self-pay

## 2016-03-23 ENCOUNTER — Ambulatory Visit: Payer: Self-pay | Admitting: Oncology

## 2016-03-30 ENCOUNTER — Other Ambulatory Visit: Payer: Self-pay | Admitting: Family Medicine

## 2016-04-15 ENCOUNTER — Encounter: Payer: Self-pay | Admitting: Oncology

## 2016-04-15 ENCOUNTER — Other Ambulatory Visit: Payer: Self-pay | Admitting: *Deleted

## 2016-04-15 ENCOUNTER — Inpatient Hospital Stay: Payer: 59 | Attending: Oncology | Admitting: Oncology

## 2016-04-15 ENCOUNTER — Inpatient Hospital Stay: Payer: 59

## 2016-04-15 VITALS — BP 149/82 | HR 69 | Temp 97.3°F | Resp 18 | Wt 210.1 lb

## 2016-04-15 DIAGNOSIS — Z794 Long term (current) use of insulin: Secondary | ICD-10-CM | POA: Insufficient documentation

## 2016-04-15 DIAGNOSIS — Z87891 Personal history of nicotine dependence: Secondary | ICD-10-CM | POA: Diagnosis not present

## 2016-04-15 DIAGNOSIS — C50919 Malignant neoplasm of unspecified site of unspecified female breast: Secondary | ICD-10-CM

## 2016-04-15 DIAGNOSIS — Z79899 Other long term (current) drug therapy: Secondary | ICD-10-CM | POA: Diagnosis not present

## 2016-04-15 DIAGNOSIS — Z803 Family history of malignant neoplasm of breast: Secondary | ICD-10-CM | POA: Insufficient documentation

## 2016-04-15 DIAGNOSIS — Z800 Family history of malignant neoplasm of digestive organs: Secondary | ICD-10-CM | POA: Diagnosis not present

## 2016-04-15 DIAGNOSIS — C50911 Malignant neoplasm of unspecified site of right female breast: Secondary | ICD-10-CM

## 2016-04-15 DIAGNOSIS — E119 Type 2 diabetes mellitus without complications: Secondary | ICD-10-CM | POA: Diagnosis not present

## 2016-04-15 DIAGNOSIS — Z853 Personal history of malignant neoplasm of breast: Secondary | ICD-10-CM | POA: Diagnosis not present

## 2016-04-15 DIAGNOSIS — I10 Essential (primary) hypertension: Secondary | ICD-10-CM | POA: Insufficient documentation

## 2016-04-15 DIAGNOSIS — K589 Irritable bowel syndrome without diarrhea: Secondary | ICD-10-CM | POA: Diagnosis not present

## 2016-04-15 LAB — CBC WITH DIFFERENTIAL/PLATELET
BASOS ABS: 0 10*3/uL (ref 0–0.1)
Basophils Relative: 0 %
EOS ABS: 0.2 10*3/uL (ref 0–0.7)
EOS PCT: 2 %
HCT: 39.2 % (ref 35.0–47.0)
Hemoglobin: 13 g/dL (ref 12.0–16.0)
LYMPHS PCT: 32 %
Lymphs Abs: 2.1 10*3/uL (ref 1.0–3.6)
MCH: 27.8 pg (ref 26.0–34.0)
MCHC: 33.1 g/dL (ref 32.0–36.0)
MCV: 83.8 fL (ref 80.0–100.0)
MONO ABS: 0.4 10*3/uL (ref 0.2–0.9)
Monocytes Relative: 6 %
Neutro Abs: 3.9 10*3/uL (ref 1.4–6.5)
Neutrophils Relative %: 60 %
PLATELETS: 244 10*3/uL (ref 150–440)
RBC: 4.68 MIL/uL (ref 3.80–5.20)
RDW: 14.1 % (ref 11.5–14.5)
WBC: 6.6 10*3/uL (ref 3.6–11.0)

## 2016-04-15 LAB — COMPREHENSIVE METABOLIC PANEL
ALBUMIN: 3.8 g/dL (ref 3.5–5.0)
ALT: 19 U/L (ref 14–54)
AST: 21 U/L (ref 15–41)
Alkaline Phosphatase: 104 U/L (ref 38–126)
Anion gap: 8 (ref 5–15)
BUN: 14 mg/dL (ref 6–20)
CHLORIDE: 104 mmol/L (ref 101–111)
CO2: 29 mmol/L (ref 22–32)
CREATININE: 0.87 mg/dL (ref 0.44–1.00)
Calcium: 9 mg/dL (ref 8.9–10.3)
GFR calc Af Amer: >60 mL/min (ref >60)
GLUCOSE: 99 mg/dL (ref 65–99)
POTASSIUM: 3.4 mmol/L — AB (ref 3.5–5.1)
SODIUM: 141 mmol/L (ref 135–145)
Total Bilirubin: 0.6 mg/dL (ref 0.3–1.2)
Total Protein: 7.6 g/dL (ref 6.5–8.1)

## 2016-04-15 NOTE — Progress Notes (Signed)
Morgan Peterson @ [facility] [phone]  [phone]     Morgan Peterson OB: [birth year]  MR#: [id number]  SWF#:[id number]  Patient Care Team: Morgan Norris, MD as PCP - General (Family Medicine) Morgan Bellow, MD (General Surgery) Morgan Norris, MD (Family Medicine)  CHIEF COMPLAINT:  Chief Complaint  Patient presents with  . Breast Cancer   Subjective: Chief Complaint/Diagnosis:   pathologic stage I (T1, c, N0, M0) ER/PR positive,Her 2/neu -this infiltrating ductal carcinoma of the right breast, status post wide local excision and sentinel node biopsy Oncotype dx: recurence score =17 Mammositesite treated right breast. Tumor dose 3400 cGy in 10 fractions one CM from the balloon surface. Start date 05/07/09. Complete date 05/13/09. Using iridium 192. Arimidex started 05/27/09. has finished treatment  in June of 2015  No history exists.    Oncology Flowsheet 10/09/2015  ondansetron (ZOFRAN-ODT) PO -    INTERVAL HISTORY: Patient is here for ongoing evaluation and treatment consideration.  Getting regular mammograms done.  Last mammogram was in January of 2017 at outside institution report has been reviewed.  Was BI-RADS 2 Here for continuation of follow-up Patient is here for ongoing evaluation and treatment consideration  REVIEW OF SYSTEMS:   GENERAL:  Feels good.  Active.  No fevers, sweats or weight loss. PERFORMANCE STATUS (ECOG):0 HEENT:  No visual changes, runny nose, sore throat, mouth sores or tenderness. Lungs: No shortness of breath or cough.  No hemoptysis. Cardiac:  No chest pain, palpitations, orthopnea, or PND. GI:  No nausea, vomiting, diarrhea, constipation, melena or hematochezia. GU:  No urgency, frequency, dysuria, or hematuria. Musculoskeletal:  No back pain.  No joint pain.  No muscle tenderness. Extremities:  No pain or swelling. Skin:  No rashes or skin changes. Neuro:  No headache, numbness or weakness, balance or coordination  issues. Endocrine:  No diabetes, thyroid issues, hot flashes or night sweats. Psych:  No mood changes, depression or anxiety. Pain:  No focal pain. Review of systems:  All other systems reviewed and found to be negative. As per HPI. Otherwise, a complete review of systems is negatve.  PAST MEDICAL HISTORY: Past Medical History  Diagnosis Date  . Diabetes mellitus without complication ([place]) 2202  . Hypertension   . Personal history of malignant neoplasm of breast 2010  . IBS (irritable bowel syndrome)   . Cancer [facility]) 2010    Right Breast  . 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 Arimidex therapy completed September 2015    PAST SURGICAL HISTORY: Past Surgical History  Procedure Laterality Date  . Abdominal exploration surgery  2000  . Colonoscopy  2003, 2013    Dr Morgan Peterson, Dr. Bary Peterson  . Tubal ligation  20 years ago  . Breast lumpectomy Right 2010  . Breast biopsy Right 2010    FAMILY HISTORY Family History  Problem Relation Age of Onset  . Colon cancer Maternal Uncle   . Breast cancer Maternal Aunt   . Hypertension Mother   . Diabetes Father     ADVANCED DIRECTIVES:  No flowsheet data found.  HEALTH MAINTENANCE: Social History  Substance Use Topics  . Smoking status: Former Smoker -- 0.25 packs/day for 10 years    Types: Cigarettes  . Smokeless tobacco: Never Used  . Alcohol Use: 0.0 oz/week    0 Standard drinks  or equivalent per week      Allergies  Allergen Reactions  . Actos [Pioglitazone] Other (See Comments)    Bladder pain  . Codeine   . Invokana [Canagliflozin] Other (See Comments)    Bladder pain  . Levaquin [Levofloxacin In D5w] Swelling  . Lisinopril Swelling    Angioedema  . Metformin And Related Nausea Only  . Onglyza [Saxagliptin] Other (See Comments)    Increased  PVC's  . Prednisolone Swelling  . Tramadol Nausea And Vomiting    Current Outpatient Prescriptions  Medication Sig Dispense Refill  . Blood Glucose Monitoring Suppl (ONETOUCH VERIO) w/Device KIT 1 Device by Does not apply route once. 1 kit 0  . BYSTOLIC 10 MG tablet Take 1 tablet by mouth  daily 90 tablet 1  . calcium carbonate 200 MG capsule Take 250 mg by mouth 2 (two) times daily with a meal.    . cholecalciferol (VITAMIN D) 1000 UNITS tablet Take 1,000 Units by mouth daily.    . cyclobenzaprine (FLEXERIL) 5 MG tablet 1-2 TABLET, ORAL, EVERY EIGHT HOURS, AS NEEDED 60 tablet 1  . glucose blood (ONETOUCH VERIO) test strip Use as instructed 100 each 12  . ibuprofen (ADVIL,MOTRIN) 800 MG tablet Take 1 tablet (800 mg total) by mouth 3 (three) times daily. 90 tablet 0  . insulin aspart (NOVOLOG) 100 UNIT/ML FlexPen Inject into the skin.    Marland Kitchen insulin glargine (LANTUS) 100 UNIT/ML injection Inject 24 Units into the skin at bedtime.    . Lancets (ONETOUCH ULTRASOFT) lancets Use as instructed 100 each 3  . LORazepam (ATIVAN) 0.5 MG tablet Take 1 tablet (0.5 mg total) by mouth 2 (two) times daily as needed for anxiety. 30 tablet 1  . lovastatin (MEVACOR) 40 MG tablet TAKE 1 TABLET BY MOUTH EVERY NIGHT AT BEDTIME 30 tablet 0  . triamterene-hydrochlorothiazide (MAXZIDE-25) 37.5-25 MG tablet Take 1 tablet by mouth  daily 90 tablet 1   No current facility-administered medications for this visit.    OBJECTIVE:  Filed Vitals:   04/15/16 1418  BP: 149/82  Pulse: 69  Temp: 97.3 F (36.3 C)  Resp: 18     Body mass index is 33.93 kg/(m^2).    ECOG FS:0 - Asymptomatic  PHYSICAL EXAM: GENERAL:  Well developed, well nourished, sitting comfortably in the exam room in no acute distress. MENTAL STATUS:  Alert and oriented to person, place and time.  ENT:  Oropharynx clear without lesion.  Tongue normal. Mucous membranes moist.  RESPIRATORY:  Clear to auscultation without rales, wheezes or  rhonchi. CARDIOVASCULAR:  Regular rate and rhythm without murmur, rub or gallop. BREAST:  Right breast without masses, skin changes or nipple discharge.  Left breast without masses, skin changes or nipple discharge. ABDOMEN:  Soft, non-tender, with active bowel sounds, and no hepatosplenomegaly.  No masses. BACK:  No CVA tenderness.  No tenderness on percussion of the back or rib cage. SKIN:  No rashes, ulcers or lesions. EXTREMITIES: No edema, no skin discoloration or tenderness.  No palpable cords. LYMPH NODES: No palpable cervical, supraclavicular, axillary or inguinal adenopathy  NEUROLOGICAL: Unremarkable. PSYCH:  Appropriate.   LAB RESULTS:  CBC Latest Ref Rng 04/15/2016 10/09/2015  WBC 3.6 - 11.0 K/uL 6.6 6.4  Hemoglobin 12.0 - 16.0 g/dL 13.0 13.7  Hematocrit 35.0 - 47.0 % 39.2 41.8  Platelets 150 - 440 K/uL 244 207    Appointment on 04/15/2016  Component Date Value Ref Range Status  . WBC 04/15/2016 6.6  3.6 - 11.0  K/uL Final  . RBC 04/15/2016 4.68  3.80 - 5.20 MIL/uL Final  . Hemoglobin 04/15/2016 13.0  12.0 - 16.0 g/dL Final  . HCT 04/15/2016 39.2  35.0 - 47.0 % Final  . MCV 04/15/2016 83.8  80.0 - 100.0 fL Final  . MCH 04/15/2016 27.8  26.0 - 34.0 pg Final  . MCHC 04/15/2016 33.1  32.0 - 36.0 g/dL Final  . RDW 04/15/2016 14.1  11.5 - 14.5 % Final  . Platelets 04/15/2016 244  150 - 440 K/uL Final  . Neutrophils Relative % 04/15/2016 60   Final  . Neutro Abs 04/15/2016 3.9  1.4 - 6.5 K/uL Final  . Lymphocytes Relative 04/15/2016 32   Final  . Lymphs Abs 04/15/2016 2.1  1.0 - 3.6 K/uL Final  . Monocytes Relative 04/15/2016 6   Final  . Monocytes Absolute 04/15/2016 0.4  0.2 - 0.9 K/uL Final  . Eosinophils Relative 04/15/2016 2   Final  . Eosinophils Absolute 04/15/2016 0.2  0 - 0.7 K/uL Final  . Basophils Relative 04/15/2016 0   Final  . Basophils Absolute 04/15/2016 0.0  0 - 0.1 K/uL Final  . Sodium 04/15/2016 141  135 - 145 mmol/L Final  . Potassium 04/15/2016 3.4*  3.5 - 5.1 mmol/L Final  . Chloride 04/15/2016 104  101 - 111 mmol/L Final  . CO2 04/15/2016 29  22 - 32 mmol/L Final  . Glucose, Bld 04/15/2016 99  65 - 99 mg/dL Final  . BUN 04/15/2016 14  6 - 20 mg/dL Final  . Creatinine, Ser 04/15/2016 0.87  0.44 - 1.00 mg/dL Final  . Calcium 04/15/2016 9.0  8.9 - 10.3 mg/dL Final  . Total Protein 04/15/2016 7.6  6.5 - 8.1 g/dL Final  . Albumin 04/15/2016 3.8  3.5 - 5.0 g/dL Final  . AST 04/15/2016 21  15 - 41 U/L Final  . ALT 04/15/2016 19  14 - 54 U/L Final  . Alkaline Phosphatase 04/15/2016 104  38 - 126 U/L Final  . Total Bilirubin 04/15/2016 0.6  0.3 - 1.2 mg/dL Final  . GFR calc non Af Amer 04/15/2016 >60  >60 mL/min Final  . GFR calc Af Amer 04/15/2016 >60  >60 mL/min Final   Comment: (NOTE) The eGFR has been calculated using the CKD EPI equation. This calculation has not been validated in all clinical situations. eGFR's persistently <60 mL/min signify possible Chronic Kidney Disease.   . Anion gap 04/15/2016 8  5 - 15 Final       STUDIES  Bilateral screening mammogram was done. Which was reported to be   BIRAD-2 All of the lab data has been reviewed  ASSESSMENT: No evidence of recurrent disease.  At this point in time patient would be discharged from my care being followed by primary care physician and by Dr. Bary Peterson    Patient expressed understanding and was in agreement with this plan. She also understands that She can call clinic at any time with any questions, concerns, or complaints.    No matching staging information was found for the patient.  Forest Gleason, MD   04/15/2016 3:09 PM

## 2016-04-17 ENCOUNTER — Encounter: Payer: Self-pay | Admitting: Oncology

## 2016-04-21 ENCOUNTER — Other Ambulatory Visit: Payer: Self-pay | Admitting: Family Medicine

## 2016-04-30 ENCOUNTER — Encounter: Payer: Self-pay | Admitting: Family Medicine

## 2016-04-30 ENCOUNTER — Ambulatory Visit (INDEPENDENT_AMBULATORY_CARE_PROVIDER_SITE_OTHER): Payer: 59 | Admitting: Family Medicine

## 2016-04-30 VITALS — BP 122/76 | HR 72 | Temp 98.2°F | Resp 16 | Ht 66.0 in | Wt 210.0 lb

## 2016-04-30 DIAGNOSIS — G4700 Insomnia, unspecified: Secondary | ICD-10-CM

## 2016-04-30 DIAGNOSIS — R059 Cough, unspecified: Secondary | ICD-10-CM

## 2016-04-30 DIAGNOSIS — R05 Cough: Secondary | ICD-10-CM | POA: Diagnosis not present

## 2016-04-30 DIAGNOSIS — F411 Generalized anxiety disorder: Secondary | ICD-10-CM | POA: Diagnosis not present

## 2016-04-30 DIAGNOSIS — E669 Obesity, unspecified: Secondary | ICD-10-CM | POA: Insufficient documentation

## 2016-04-30 DIAGNOSIS — I10 Essential (primary) hypertension: Secondary | ICD-10-CM | POA: Diagnosis not present

## 2016-04-30 DIAGNOSIS — T783XXA Angioneurotic edema, initial encounter: Secondary | ICD-10-CM | POA: Insufficient documentation

## 2016-04-30 MED ORDER — CITALOPRAM HYDROBROMIDE 20 MG PO TABS: 20.0000 mg | ORAL_TABLET | Freq: Every day | ORAL | Status: DC

## 2016-04-30 MED ORDER — LORAZEPAM 0.5 MG PO TABS: 0.5000 mg | ORAL_TABLET | Freq: Every day | ORAL | Status: DC | PRN

## 2016-04-30 MED ORDER — FLUTICASONE FUROATE-VILANTEROL 100-25 MCG/INH IN AEPB: 1.0000 | INHALATION_SPRAY | Freq: Every day | RESPIRATORY_TRACT | Status: DC

## 2016-04-30 MED ORDER — TRAZODONE HCL 50 MG PO TABS: 25.0000 mg | ORAL_TABLET | Freq: Every evening | ORAL | Status: DC | PRN

## 2016-04-30 NOTE — Progress Notes (Signed)
Name: Morgan Peterson   MRN: [id number]    DOB: [birth year]   Date:04/30/2016       Progress Note  Subjective  Chief Complaint  Chief Complaint  Patient presents with  . Medication Management    patient was given Ativan to take PRN but it now is not working as it should  . Muscle Pain    patient had recently lifted a case of water and thinks she might have pulled a chest muscle  . Bronchitis    patient was recently treated for bronchitis and feels that it has returned after visiting her sister (smoker)    HPI  Cough: she states she has been taking care of her sister that is a smoker and about 3 weeks ago she developed a dry cough, wheezing but no SOB, she went to Urgent Care and was given Proair , Zpack and tessalon perles. She states she started to feel better but she is back at her sister's house and has been coughing again. She also feels like she has pulled a muscle on her chest that hurts when she coughs.   GAD: she has a long history of anxiety, but worse recently since her sister was attacked by her niece's pit-bull . The dog also attacked her two nephew's and her niece. She has been having to take care of them. Worries all the time, unable to fall asleep, mind is very busy.   DMII: sees Endocrinologist and glucose has been under control  HTN: doing well, taking medication as prescribed and no side effects. No SOB at this time.   Patient Active Problem List   Diagnosis Date Noted  . Adult BMI 30+ 04/30/2016  . Angio-edema 04/30/2016  . GAD (generalized anxiety disorder) 11/04/2015  . Hyperlipidemia 11/04/2015  . Cervical radiculopathy at C6 07/31/2015  . Neuroma digital nerve 07/31/2015  . Type 2 diabetes mellitus without complication ([place]) 40/98/1191  . Right shoulder pain 06/30/2015  . Cervical disc disease 06/30/2015  . Essential hypertension 06/30/2015  . Breast cancer ([place]) 01/07/2014    Past Surgical History  Procedure Laterality Date  . Abdominal exploration  surgery  2000  . Colonoscopy  2003, 2013    Dr Alveta Heimlich, Dr. Bary Castilla  . Tubal ligation  20 years ago  . Breast lumpectomy Right 2010  . Breast biopsy Right 2010    Family History  Problem Relation Age of Onset  . Colon cancer Maternal Uncle   . Breast cancer Maternal Aunt   . Hypertension Mother   . Hyperlipidemia Mother   . Diabetes Father   . Kidney disease Father   . Diabetes Brother     Social History   Social History  . Marital Status: Married    Spouse Name: N/A  . Number of Children: N/A  . Years of Education: N/A   Occupational History  . Not on file.   Social History Main Topics  . Smoking status: Former Smoker -- 0.25 packs/day for 10 years    Types: Cigarettes  . Smokeless tobacco: Never Used  . Alcohol Use: 0.0 oz/week    0 Standard drinks or equivalent per week  . Drug Use: No  . Sexual Activity: Not on file   Other Topics Concern  . Not on file   Social History Narrative     Current outpatient prescriptions:  .  Blood Glucose Monitoring Suppl (ONETOUCH VERIO) w/Device KIT, 1 Device by Does not apply route once., Disp: 1 kit, Rfl: 0 .  BYSTOLIC  10 MG tablet, Take 1 tablet by mouth  daily, Disp: 90 tablet, Rfl: 1 .  calcium carbonate 200 MG capsule, Take 250 mg by mouth 2 (two) times daily with a meal., Disp: , Rfl:  .  cholecalciferol (VITAMIN D) 1000 UNITS tablet, Take 1,000 Units by mouth daily., Disp: , Rfl:  .  citalopram (CELEXA) 20 MG tablet, Take 1 tablet (20 mg total) by mouth daily., Disp: 30 tablet, Rfl: 0 .  cyclobenzaprine (FLEXERIL) 5 MG tablet, 1-2 TABLET, ORAL, EVERY EIGHT HOURS, AS NEEDED, Disp: 60 tablet, Rfl: 1 .  fluticasone furoate-vilanterol (BREO ELLIPTA) 100-25 MCG/INH AEPB, Inhale 1 puff into the lungs daily., Disp: 60 each, Rfl: 0 .  glucose blood (ONETOUCH VERIO) test strip, Use as instructed, Disp: 100 each, Rfl: 12 .  ibuprofen (ADVIL,MOTRIN) 800 MG tablet, Take 1 tablet (800 mg total) by mouth 3 (three) times daily., Disp:  90 tablet, Rfl: 0 .  insulin aspart (NOVOLOG) 100 UNIT/ML FlexPen, Inject into the skin., Disp: , Rfl:  .  insulin glargine (LANTUS) 100 UNIT/ML injection, Inject 24 Units into the skin at bedtime., Disp: , Rfl:  .  Lancets (ONETOUCH ULTRASOFT) lancets, Use as instructed, Disp: 100 each, Rfl: 3 .  LORazepam (ATIVAN) 0.5 MG tablet, Take 1 tablet (0.5 mg total) by mouth daily as needed for anxiety., Disp: 30 tablet, Rfl: 0 .  lovastatin (MEVACOR) 40 MG tablet, TAKE 1 TABLET BY MOUTH EVERY NIGHT AT BEDTIME, Disp: 30 tablet, Rfl: 0 .  traZODone (DESYREL) 50 MG tablet, Take 0.5-1 tablets (25-50 mg total) by mouth at bedtime as needed for sleep., Disp: 30 tablet, Rfl: 0 .  triamterene-hydrochlorothiazide (MAXZIDE-25) 37.5-25 MG tablet, Take 1 tablet by mouth  daily, Disp: 90 tablet, Rfl: 0  Allergies  Allergen Reactions  . Actos [Pioglitazone] Other (See Comments)    Bladder pain  . Amlodipine-Olmesartan   . Codeine   . Gabapentin     cns side effects.  . Glipizide     Other reaction(s): Abdominal pain  . Invokana [Canagliflozin] Other (See Comments)    Bladder pain  . Levaquin [Levofloxacin In D5w] Swelling  . Lisinopril Swelling    Angioedema  . Metformin And Related Nausea Only  . Onglyza [Saxagliptin] Other (See Comments)    Increased PVC's  . Tramadol Nausea And Vomiting     ROS  Ten systems reviewed and is negative except as mentioned in HPI   Objective  Filed Vitals:   04/30/16 1028  BP: 122/76  Pulse: 72  Temp: 98.2 F (36.8 C)  TempSrc: Oral  Resp: 16  Height: '5\' 6"'  (1.676 m)  Weight: 210 lb (95.255 kg)  SpO2: 96%    Body mass index is 33.91 kg/(m^2).  Physical Exam  Constitutional: Patient appears well-developed and well-nourished. Obese  No distress.  HEENT: head atraumatic, normocephalic, pupils equal and reactive to light, neck supple, throat within normal limits Cardiovascular: Normal rate, regular rhythm and normal heart sounds.  No murmur heard. No  BLE edema. Mild tenderness during palpation of left upper chest wall, no rashes Pulmonary/Chest: Effort normal and breath sounds normal. No respiratory distress. Abdominal: Soft.  There is no tenderness. Psychiatric: Patient has a normal mood and affect. behavior is normal. Judgment and thought content normal.  Recent Results (from the past 2160 hour(s))  CBC with Differential     Status: None   Collection Time: 04/15/16  1:49 PM  Result Value Ref Range   WBC 6.6 3.6 - 11.0 K/uL  RBC 4.68 3.80 - 5.20 MIL/uL   Hemoglobin 13.0 12.0 - 16.0 g/dL   HCT 39.2 35.0 - 47.0 %   MCV 83.8 80.0 - 100.0 fL   MCH 27.8 26.0 - 34.0 pg   MCHC 33.1 32.0 - 36.0 g/dL   RDW 14.1 11.5 - 14.5 %   Platelets 244 150 - 440 K/uL   Neutrophils Relative % 60 %   Neutro Abs 3.9 1.4 - 6.5 K/uL   Lymphocytes Relative 32 %   Lymphs Abs 2.1 1.0 - 3.6 K/uL   Monocytes Relative 6 %   Monocytes Absolute 0.4 0.2 - 0.9 K/uL   Eosinophils Relative 2 %   Eosinophils Absolute 0.2 0 - 0.7 K/uL   Basophils Relative 0 %   Basophils Absolute 0.0 0 - 0.1 K/uL  Comprehensive metabolic panel     Status: Abnormal   Collection Time: 04/15/16  1:49 PM  Result Value Ref Range   Sodium 141 135 - 145 mmol/L   Potassium 3.4 (L) 3.5 - 5.1 mmol/L   Chloride 104 101 - 111 mmol/L   CO2 29 22 - 32 mmol/L   Glucose, Bld 99 65 - 99 mg/dL   BUN 14 6 - 20 mg/dL   Creatinine, Ser 0.87 0.44 - 1.00 mg/dL   Calcium 9.0 8.9 - 10.3 mg/dL   Total Protein 7.6 6.5 - 8.1 g/dL   Albumin 3.8 3.5 - 5.0 g/dL   AST 21 15 - 41 U/L   ALT 19 14 - 54 U/L   Alkaline Phosphatase 104 38 - 126 U/L   Total Bilirubin 0.6 0.3 - 1.2 mg/dL   GFR calc non Af Amer >60 >60 mL/min   GFR calc Af Amer >60 >60 mL/min    Comment: (NOTE) The eGFR has been calculated using the CKD EPI equation. This calculation has not been validated in all clinical situations. eGFR's persistently <60 mL/min signify possible Chronic Kidney Disease.    Anion gap 8 5 - 15     PHQ2/9: Depression screen [facility] 2/9 04/30/2016 11/04/2015 10/03/2015 07/31/2015  Decreased Interest 0 0 0 0  Down, Depressed, Hopeless 0 0 0 0  PHQ - 2 Score 0 0 0 0     Fall Risk: Fall Risk  04/30/2016 11/04/2015 10/03/2015 07/31/2015 06/30/2015  Falls in the past year? No No No No No     Functional Status Survey: Is the patient deaf or have difficulty hearing?: No Does the patient have difficulty seeing, even when wearing glasses/contacts?: No Does the patient have difficulty concentrating, remembering, or making decisions?: No Does the patient have difficulty walking or climbing stairs?: No Does the patient have difficulty dressing or bathing?: No Does the patient have difficulty doing errands alone such as visiting a doctor's office or shopping?: No   Assessment & Plan  1. Essential hypertension  Well controlled with medication   2. GAD (generalized anxiety disorder)  We will try Celexa, take Ativan prn only - citalopram (CELEXA) 20 MG tablet; Take 1 tablet (20 mg total) by mouth daily.  Dispense: 30 tablet; Refill: 0 - LORazepam (ATIVAN) 0.5 MG tablet; Take 1 tablet (0.5 mg total) by mouth daily as needed for anxiety.  Dispense: 30 tablet; Refill: 0  3. Cough  Likely post-bronchial cough, but because of history of breast cancer we will check a CXR, start Breo and return for spirometry if symptoms does not resolve - fluticasone furoate-vilanterol (BREO ELLIPTA) 100-25 MCG/INH AEPB; Inhale 1 puff into the lungs daily.  Dispense:  60 each; Refill: 0 - DG Chest 2 View; Future  4. Insomnia  - traZODone (DESYREL) 50 MG tablet; Take 0.5-1 tablets (25-50 mg total) by mouth at bedtime as needed for sleep.  Dispense: 30 tablet; Refill: 0

## 2016-05-04 ENCOUNTER — Ambulatory Visit
Admission: RE | Admit: 2016-05-04 | Discharge: 2016-05-04 | Disposition: A | Discharge status: Home / Self Care | Payer: 59 | Source: Ambulatory Visit | Attending: Family Medicine | Admitting: Family Medicine

## 2016-05-04 DIAGNOSIS — R05 Cough: Secondary | ICD-10-CM | POA: Insufficient documentation

## 2016-05-04 DIAGNOSIS — R059 Cough, unspecified: Secondary | ICD-10-CM

## 2016-05-07 ENCOUNTER — Other Ambulatory Visit: Payer: Self-pay | Admitting: Family Medicine

## 2016-06-02 ENCOUNTER — Encounter: Payer: Self-pay | Admitting: Family Medicine

## 2016-06-02 ENCOUNTER — Ambulatory Visit (INDEPENDENT_AMBULATORY_CARE_PROVIDER_SITE_OTHER): Payer: 59 | Admitting: Family Medicine

## 2016-06-02 VITALS — BP 122/70 | HR 67 | Temp 98.8°F | Resp 14 | Ht 66.0 in | Wt 213.3 lb

## 2016-06-02 DIAGNOSIS — J320 Chronic maxillary sinusitis: Secondary | ICD-10-CM

## 2016-06-02 DIAGNOSIS — J302 Other seasonal allergic rhinitis: Secondary | ICD-10-CM | POA: Diagnosis not present

## 2016-06-02 DIAGNOSIS — K449 Diaphragmatic hernia without obstruction or gangrene: Secondary | ICD-10-CM | POA: Diagnosis not present

## 2016-06-02 DIAGNOSIS — F411 Generalized anxiety disorder: Secondary | ICD-10-CM

## 2016-06-02 DIAGNOSIS — G4700 Insomnia, unspecified: Secondary | ICD-10-CM

## 2016-06-02 MED ORDER — AMOXICILLIN-POT CLAVULANATE 875-125 MG PO TABS: 1.0000 | ORAL_TABLET | Freq: Two times a day (BID) | ORAL | Status: DC

## 2016-06-02 MED ORDER — TRAZODONE HCL 50 MG PO TABS: ORAL_TABLET | ORAL | Status: DC

## 2016-06-02 MED ORDER — FLUTICASONE PROPIONATE 50 MCG/ACT NA SUSP: 2.0000 | Freq: Every day | NASAL | Status: DC

## 2016-06-02 NOTE — Progress Notes (Signed)
Name: Morgan Peterson   MRN: [id number]    DOB: June 14, 1951   Date:06/02/2016       Progress Note  Subjective  Chief Complaint  Chief Complaint  Patient presents with  . Anxiety  . Sinusitis    patient stated that she has bilateral ear pain, left sided facial pressure and some drainage.  . Insomnia    HPI  GAD: she tried Citalopram but states it made her feel weird so she stopped medication. She is taking Ativan prn now, only a few tablets over the past month. She states mood is better because she has been sleeping better with Trazodone, and not feeling as anxious.   Insomnia: she is only taking Trazodone prn, a few times weekly and is feeling well, able to rest and no side effects of medication  Sinusitis: she has a history of sinus infections, seen by ENT in the past, took Z-pack in April for bronchitis, over the past week she has developed left maxillary pain, ear pressure and otalgia. She states pain is described as a dull ache 4/10. Pain is worse in am's. She has some post-nasal drainage, no fever. Her appetite has decreased.   Hiatal Hernia: moderate, seen on CXR, she states she has mild odynophagia when she eats fast and larges pieces of meat. She has occasional epigastric pain, but under control if she follows a GERD diet  Patient Active Problem List   Diagnosis Date Noted  . Hiatal hernia 06/02/2016  . Allergic rhinitis, seasonal 06/02/2016  . Adult BMI 30+ 04/30/2016  . Angio-edema 04/30/2016  . GAD (generalized anxiety disorder) 11/04/2015  . Hyperlipidemia 11/04/2015  . Cervical radiculopathy at C6 07/31/2015  . Neuroma digital nerve 07/31/2015  . Type 2 diabetes mellitus without complication ([place]) 79/89/2119  . Right shoulder pain 06/30/2015  . Cervical disc disease 06/30/2015  . Essential hypertension 06/30/2015  . Breast cancer ([place]) 01/07/2014    Past Surgical History  Procedure Laterality Date  . Abdominal exploration surgery  2000  . Colonoscopy  2003,  2013    Dr Alveta Heimlich, Dr. Bary Castilla  . Tubal ligation  20 years ago  . Breast lumpectomy Right 2010  . Breast biopsy Right 2010    Family History  Problem Relation Age of Onset  . Colon cancer Maternal Uncle   . Breast cancer Maternal Aunt   . Hypertension Mother   . Hyperlipidemia Mother   . Diabetes Father   . Kidney disease Father   . Diabetes Brother     Social History   Social History  . Marital Status: Married    Spouse Name: N/A  . Number of Children: N/A  . Years of Education: N/A   Occupational History  . Not on file.   Social History Main Topics  . Smoking status: Former Smoker -- 0.25 packs/day for 10 years    Types: Cigarettes  . Smokeless tobacco: Never Used  . Alcohol Use: 0.0 oz/week    0 Standard drinks or equivalent per week  . Drug Use: No  . Sexual Activity: Not on file   Other Topics Concern  . Not on file   Social History Narrative     Current outpatient prescriptions:  .  amoxicillin-clavulanate (AUGMENTIN) 875-125 MG tablet, Take 1 tablet by mouth 2 (two) times daily., Disp: 28 tablet, Rfl: 0 .  Blood Glucose Monitoring Suppl (ONETOUCH VERIO) w/Device KIT, 1 Device by Does not apply route once., Disp: 1 kit, Rfl: 0 .  BREO ELLIPTA 100-25 MCG/INH  AEPB, Inhale 1 inhalation into  the lungs every day, Disp: 60 each, Rfl: 0 .  BYSTOLIC 10 MG tablet, Take 1 tablet by mouth  daily, Disp: 90 tablet, Rfl: 1 .  calcium carbonate 200 MG capsule, Take 250 mg by mouth 2 (two) times daily with a meal., Disp: , Rfl:  .  cholecalciferol (VITAMIN D) 1000 UNITS tablet, Take 1,000 Units by mouth daily., Disp: , Rfl:  .  cyclobenzaprine (FLEXERIL) 5 MG tablet, 1-2 TABLET, ORAL, EVERY EIGHT HOURS, AS NEEDED, Disp: 60 tablet, Rfl: 1 .  fluticasone (FLONASE) 50 MCG/ACT nasal spray, Place 2 sprays into both nostrils daily., Disp: 16 g, Rfl: 1 .  glucose blood (ONETOUCH VERIO) test strip, Use as instructed, Disp: 100 each, Rfl: 12 .  ibuprofen (ADVIL,MOTRIN) 800 MG  tablet, Take 1 tablet (800 mg total) by mouth 3 (three) times daily., Disp: 90 tablet, Rfl: 0 .  insulin aspart (NOVOLOG) 100 UNIT/ML FlexPen, Inject into the skin., Disp: , Rfl:  .  insulin glargine (LANTUS) 100 UNIT/ML injection, Inject 24 Units into the skin at bedtime., Disp: , Rfl:  .  Lancets (ONETOUCH ULTRASOFT) lancets, Use as instructed, Disp: 100 each, Rfl: 3 .  LORazepam (ATIVAN) 0.5 MG tablet, Take 1 tablet (0.5 mg total) by mouth daily as needed for anxiety., Disp: 30 tablet, Rfl: 0 .  lovastatin (MEVACOR) 40 MG tablet, TAKE 1 TABLET BY MOUTH EVERY NIGHT AT BEDTIME, Disp: 30 tablet, Rfl: 0 .  traZODone (DESYREL) 50 MG tablet, TAKE 1/2 TO 1 TABLET BY  MOUTH AT BEDTIME AS NEEDED  FOR SLEEP., Disp: 90 tablet, Rfl: 1 .  triamterene-hydrochlorothiazide (MAXZIDE-25) 37.5-25 MG tablet, Take 1 tablet by mouth  daily, Disp: 90 tablet, Rfl: 0  Allergies  Allergen Reactions  . Actos [Pioglitazone] Other (See Comments)    Bladder pain  . Amlodipine-Olmesartan   . Codeine   . Gabapentin     cns side effects.  . Glipizide     Other reaction(s): Abdominal pain  . Invokana [Canagliflozin] Other (See Comments)    Bladder pain  . Levaquin [Levofloxacin In D5w] Swelling  . Lisinopril Swelling    Angioedema  . Metformin And Related Nausea Only  . Onglyza [Saxagliptin] Other (See Comments)    Increased PVC's  . Tramadol Nausea And Vomiting     ROS   Ten systems reviewed and is negative except as mentioned in HPI   Objective  Filed Vitals:   06/02/16 1026  BP: 122/70  Pulse: 67  Temp: 98.8 F (37.1 C)  TempSrc: Oral  Resp: 14  Height: '5\' 6"'  (1.676 m)  Weight: 213 lb 4.8 oz (96.752 kg)  SpO2: 96%    Body mass index is 34.44 kg/(m^2).  Physical Exam  Constitutional: Patient appears well-developed and well-nourished. Obese  No distress.  HEENT: head atraumatic, normocephalic, pupils equal and reactive to light, ears TMn normal bilaterally, tender left maxillary sinus neck  supple, throat within normal limits Cardiovascular: Normal rate, regular rhythm and normal heart sounds.  No murmur heard. No BLE edema. Pulmonary/Chest: Effort normal and breath sounds normal. No respiratory distress. Abdominal: Soft.  There is no tenderness. Psychiatric: Patient has a normal mood and affect. behavior is normal. Judgment and thought content normal.  Recent Results (from the past 2160 hour(s))  CBC with Differential     Status: None   Collection Time: 04/15/16  1:49 PM  Result Value Ref Range   WBC 6.6 3.6 - 11.0 K/uL   RBC 4.68 3.80 -  5.20 MIL/uL   Hemoglobin 13.0 12.0 - 16.0 g/dL   HCT 39.2 35.0 - 47.0 %   MCV 83.8 80.0 - 100.0 fL   MCH 27.8 26.0 - 34.0 pg   MCHC 33.1 32.0 - 36.0 g/dL   RDW 14.1 11.5 - 14.5 %   Platelets 244 150 - 440 K/uL   Neutrophils Relative % 60 %   Neutro Abs 3.9 1.4 - 6.5 K/uL   Lymphocytes Relative 32 %   Lymphs Abs 2.1 1.0 - 3.6 K/uL   Monocytes Relative 6 %   Monocytes Absolute 0.4 0.2 - 0.9 K/uL   Eosinophils Relative 2 %   Eosinophils Absolute 0.2 0 - 0.7 K/uL   Basophils Relative 0 %   Basophils Absolute 0.0 0 - 0.1 K/uL  Comprehensive metabolic panel     Status: Abnormal   Collection Time: 04/15/16  1:49 PM  Result Value Ref Range   Sodium 141 135 - 145 mmol/L   Potassium 3.4 (L) 3.5 - 5.1 mmol/L   Chloride 104 101 - 111 mmol/L   CO2 29 22 - 32 mmol/L   Glucose, Bld 99 65 - 99 mg/dL   BUN 14 6 - 20 mg/dL   Creatinine, Ser 0.87 0.44 - 1.00 mg/dL   Calcium 9.0 8.9 - 10.3 mg/dL   Total Protein 7.6 6.5 - 8.1 g/dL   Albumin 3.8 3.5 - 5.0 g/dL   AST 21 15 - 41 U/L   ALT 19 14 - 54 U/L   Alkaline Phosphatase 104 38 - 126 U/L   Total Bilirubin 0.6 0.3 - 1.2 mg/dL   GFR calc non Af Amer >60 >60 mL/min   GFR calc Af Amer >60 >60 mL/min    Comment: (NOTE) The eGFR has been calculated using the CKD EPI equation. This calculation has not been validated in all clinical situations. eGFR's persistently <60 mL/min signify possible  Chronic Kidney Disease.    Anion gap 8 5 - 15      PHQ2/9: Depression screen [facility] 2/9 06/02/2016 04/30/2016 11/04/2015 10/03/2015 07/31/2015  Decreased Interest 0 0 0 0 0  Down, Depressed, Hopeless 0 0 0 0 0  PHQ - 2 Score 0 0 0 0 0     Fall Risk: Fall Risk  06/02/2016 04/30/2016 11/04/2015 10/03/2015 07/31/2015  Falls in the past year? No No No No No     Functional Status Survey: Is the patient deaf or have difficulty hearing?: No Does the patient have difficulty seeing, even when wearing glasses/contacts?: No Does the patient have difficulty concentrating, remembering, or making decisions?: No Does the patient have difficulty walking or climbing stairs?: No Does the patient have difficulty dressing or bathing?: No Does the patient have difficulty doing errands alone such as visiting a doctor's office or shopping?: No    Assessment & Plan  1. GAD (generalized anxiety disorder)  Off Celexa, doing better now  2. Insomnia  - traZODone (DESYREL) 50 MG tablet; TAKE 1/2 TO 1 TABLET BY  MOUTH AT BEDTIME AS NEEDED  FOR SLEEP.  Dispense: 90 tablet; Refill: 1  3. Hiatal hernia  She has epigastric fullness at times  4. Allergic rhinitis, seasonal  - fluticasone (FLONASE) 50 MCG/ACT nasal spray; Place 2 sprays into both nostrils daily.  Dispense: 16 g; Refill: 1  5. Left maxillary sinusitis  - amoxicillin-clavulanate (AUGMENTIN) 875-125 MG tablet; Take 1 tablet by mouth 2 (two) times daily.  Dispense: 28 tablet; Refill: 0

## 2016-06-02 NOTE — Patient Instructions (Signed)
Hypokalemia Hypokalemia means that the amount of potassium in the blood is lower than normal.Potassium is a chemical, called an electrolyte, that helps regulate the amount of fluid in the body. It also stimulates muscle contraction and helps nerves function properly.Most of the body's potassium is inside of cells, and only a very small amount is in the blood. Because the amount in the blood is so small, minor changes can be life-threatening. CAUSES Antibiotics. Diarrhea or vomiting. Using laxatives too much, which can cause diarrhea. Chronic kidney disease. Water pills (diuretics). Eating disorders (bulimia). Low magnesium level. Sweating a lot. SIGNS AND SYMPTOMS Weakness. Constipation. Fatigue. Muscle cramps. Mental confusion. Skipped heartbeats or irregular heartbeat (palpitations). Tingling or numbness. DIAGNOSIS  Your health care provider can diagnose hypokalemia with blood tests. In addition to checking your potassium level, your health care provider may also check other lab tests. TREATMENT Hypokalemia can be treated with potassium supplements taken by mouth or adjustments in your current medicines. If your potassium level is very low, you may need to get potassium through a vein (IV) and be monitored in the hospital. A diet high in potassium is also helpful. Foods high in potassium are: Nuts, such as peanuts and pistachios. Seeds, such as sunflower seeds and pumpkin seeds. Peas, lentils, and lima beans. Whole grain and bran cereals and breads. Fresh fruit and vegetables, such as apricots, avocado, bananas, cantaloupe, kiwi, oranges, tomatoes, asparagus, and potatoes. Orange and tomato juices. Red meats. Fruit yogurt. HOME CARE INSTRUCTIONS Take all medicines as prescribed by your health care provider. Maintain a healthy diet by including nutritious food, such as fruits, vegetables, nuts, whole grains, and lean meats. If you are taking a laxative, be sure to follow the  directions on the label. SEEK MEDICAL CARE IF: Your weakness gets worse. You feel your heart pounding or racing. You are vomiting or having diarrhea. You are diabetic and having trouble keeping your blood glucose in the normal range. SEEK IMMEDIATE MEDICAL CARE IF: You have chest pain, shortness of breath, or dizziness. You are vomiting or having diarrhea for more than 2 days. You faint. MAKE SURE YOU:  Understand these instructions. Will watch your condition. Will get help right away if you are not doing well or get worse.   This information is not intended to replace advice given to you by your health care provider. Make sure you discuss any questions you have with your health care provider.   Document Released: 12/06/2005 Document Revised: 12/27/2014 Document Reviewed: 06/08/2013 Elsevier Interactive Patient Education 2016 [employer] Hiatal Hernia A hiatal hernia occurs when part of your stomach slides above the muscle that separates your abdomen from your chest (diaphragm). You can be born with a hiatal hernia (congenital), or it may develop over time. In almost all cases of hiatal hernia, only the top part of the stomach pushes through.  Many people have a hiatal hernia with no symptoms. The larger the hernia, the more likely that you will have symptoms. In some cases, a hiatal hernia allows stomach acid to flow back into the tube that carries food from your mouth to your stomach (esophagus). This may cause heartburn symptoms. Severe heartburn symptoms may mean you have developed a condition called gastroesophageal reflux disease (GERD).  CAUSES  Hiatal hernias are caused by a weakness in the opening (hiatus) where your esophagus passes through your diaphragm to attach to the upper part of your stomach. You may be born with a weakness in your hiatus, or a weakness can  develop. RISK FACTORS Older age is a major risk factor for a hiatal hernia. Anything that increases pressure on your  diaphragm can also increase your risk of a hiatal hernia. This includes:  Pregnancy.  Excess weight.  Frequent constipation. SIGNS AND SYMPTOMS  People with a hiatal hernia often have no symptoms. If symptoms develop, they are almost always caused by GERD. They may include:  Heartburn.  Belching.  Indigestion.  Trouble swallowing.  Coughing or wheezing.  Sore throat.  Hoarseness.  Chest pain. DIAGNOSIS  A hiatal hernia is sometimes found during an exam for another problem. Your health care provider may suspect a hiatal hernia if you have symptoms of GERD. Tests may be done to diagnose GERD. These may include:  X-rays of your stomach or chest.  An upper gastrointestinal (GI) series. This is an X-ray exam of your GI tract involving the use of a chalky liquid that you swallow. The liquid shows up clearly on the X-ray.  Endoscopy. This is a procedure to look into your stomach using a thin, flexible tube that has a tiny camera and light on the end of it. TREATMENT  If you have no symptoms, you may not need treatment. If you have symptoms, treatment may include:  Dietary and lifestyle changes to help reduce GERD symptoms.  Medicines. These may include:  Over-the-counter antacids.  Medicines that make your stomach empty more quickly.  Medicines that block the production of stomach acid (H2 blockers).  Stronger medicines to reduce stomach acid (proton pump inhibitors).  You may need surgery to repair the hernia if other treatments are not helping. HOME CARE INSTRUCTIONS   Take all medicines as directed by your health care provider.  Quit smoking, if you smoke.  Try to achieve and maintain a healthy body weight.  Eat frequent small meals instead of three large meals a day. This keeps your stomach from getting too full.  Eat slowly.  Do not lie down right after eating.  Do noteat 1-2 hours before bed.   Do not drink beverages with caffeine. These include  cola, coffee, cocoa, and tea.  Do not drink alcohol.  Avoid foods that can make symptoms of GERD worse. These may include:  Fatty foods.  Citrus fruits.  Other foods and drinks that contain acid.  Avoid putting pressure on your belly. Anything that puts pressure on your belly increases the amount of acid that may be pushed up into your esophagus.   Avoid bending over, especially after eating.  Raise the head of your bed by putting blocks under the legs. This keeps your head and esophagus higher than your stomach.  Do not wear tight clothing around your chest or stomach.  Try not to strain when having a bowel movement, when urinating, or when lifting heavy objects. SEEK MEDICAL CARE IF:  Your symptoms are not controlled with medicines or lifestyle changes.  You are having trouble swallowing.  You have coughing or wheezing that will not go away. SEEK IMMEDIATE MEDICAL CARE IF:  Your pain is getting worse.  Your pain spreads to your arms, neck, jaw, teeth, or back.  You have shortness of breath.  You sweat for no reason.  You feel sick to your stomach (nauseous) or vomit.  You vomit blood.  You have bright red blood in your stools.  You have black, tarry stools.    This information is not intended to replace advice given to you by your health care provider. Make sure you discuss any  questions you have with your health care provider.   Document Released: 02/26/2004 Document Revised: 12/27/2014 Document Reviewed: 11/23/2013 Elsevier Interactive Patient Education [employer].

## 2016-07-27 ENCOUNTER — Encounter: Payer: 59 | Admitting: Family Medicine

## 2016-08-18 ENCOUNTER — Other Ambulatory Visit: Payer: Self-pay | Admitting: Family Medicine

## 2016-08-20 ENCOUNTER — Other Ambulatory Visit: Payer: Self-pay

## 2016-08-23 MED ORDER — TRIAMTERENE-HCTZ 37.5-25 MG PO TABS: 1.0000 | ORAL_TABLET | Freq: Every day | ORAL | 0 refills | Status: DC

## 2016-08-31 ENCOUNTER — Other Ambulatory Visit: Payer: Self-pay | Admitting: Family Medicine

## 2016-08-31 DIAGNOSIS — J302 Other seasonal allergic rhinitis: Secondary | ICD-10-CM

## 2016-08-31 NOTE — Telephone Encounter (Signed)
Patient requesting refill of Flonase be sent to [facility] [phone].

## 2016-09-03 ENCOUNTER — Other Ambulatory Visit: Payer: Self-pay | Admitting: Family Medicine

## 2016-09-03 ENCOUNTER — Encounter: Payer: Self-pay | Admitting: Family Medicine

## 2016-09-03 ENCOUNTER — Ambulatory Visit (INDEPENDENT_AMBULATORY_CARE_PROVIDER_SITE_OTHER): Payer: Self-pay | Admitting: Family Medicine

## 2016-09-03 VITALS — BP 138/72 | HR 68 | Temp 98.6°F | Resp 16 | Ht 66.0 in | Wt 220.4 lb

## 2016-09-03 DIAGNOSIS — I10 Essential (primary) hypertension: Secondary | ICD-10-CM

## 2016-09-03 DIAGNOSIS — R888 Abnormal findings in other body fluids and substances: Secondary | ICD-10-CM

## 2016-09-03 DIAGNOSIS — R5383 Other fatigue: Secondary | ICD-10-CM

## 2016-09-03 DIAGNOSIS — Z23 Encounter for immunization: Secondary | ICD-10-CM

## 2016-09-03 DIAGNOSIS — M79645 Pain in left finger(s): Secondary | ICD-10-CM

## 2016-09-03 DIAGNOSIS — Z853 Personal history of malignant neoplasm of breast: Secondary | ICD-10-CM

## 2016-09-03 DIAGNOSIS — Z01419 Encounter for gynecological examination (general) (routine) without abnormal findings: Secondary | ICD-10-CM

## 2016-09-03 DIAGNOSIS — B977 Papillomavirus as the cause of diseases classified elsewhere: Secondary | ICD-10-CM

## 2016-09-03 DIAGNOSIS — E785 Hyperlipidemia, unspecified: Secondary | ICD-10-CM

## 2016-09-03 DIAGNOSIS — Z0001 Encounter for general adult medical examination with abnormal findings: Secondary | ICD-10-CM

## 2016-09-03 DIAGNOSIS — IMO0002 Reserved for concepts with insufficient information to code with codable children: Secondary | ICD-10-CM

## 2016-09-03 DIAGNOSIS — Z124 Encounter for screening for malignant neoplasm of cervix: Secondary | ICD-10-CM

## 2016-09-03 MED ORDER — ATORVASTATIN CALCIUM 40 MG PO TABS: 40.0000 mg | ORAL_TABLET | Freq: Every day | ORAL | 3 refills | Status: DC

## 2016-09-03 NOTE — Progress Notes (Signed)
Name: Morgan Peterson   MRN: [id number]    DOB: [birth year]   Date:09/03/2016       Progress Note  Subjective  Chief Complaint  Chief Complaint  Patient presents with  . Annual Exam    HPI  Well Woman: she is not sexually active because husband has ED, no breast problems, she denies incontinence, normal bowel movements.   GAD: she tried Citalopram but states it made her feel weird so she stopped medication. She is taking Ativan prn now, only a few tablets over the past month. She states mood is better because she has been sleeping better with Trazodone, and not feeling as anxious. Unchanged  Insomnia: she is only taking Trazodone prn, a few times weekly and is feeling well, able to rest and no side effects of medication,she denies snoring or gasping for air during the night  DMII: she sees Dr. Graceann Congress and last hgbA1C was at goal and also urine micro is up to date  Palpitation: she is on Bystolic but states still has some palpitation, no chest pain or SOB with it.    Hiatal Hernia: moderate, seen on CXR, she states she has mild odynophagia when she eats fast and larges pieces of meat. She has occasional epigastric pain, but under control if she follows a GERD.   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 She went to Urgent Care and x-ray was negative, she worse the splint, but it continues to be sore and swollen that is worse at the end of the day.   Patient Active Problem List   Diagnosis Date Noted  . Hiatal hernia 06/02/2016  . Allergic rhinitis, seasonal 06/02/2016  . Adult BMI 30+ 04/30/2016  .  Angio-edema 04/30/2016  . GAD (generalized anxiety disorder) 11/04/2015  . Hyperlipidemia 11/04/2015  . Cervical radiculopathy at C6 07/31/2015  . Neuroma digital nerve 07/31/2015  . Type 2 diabetes mellitus without complication ([place]) [id number]/69/4854  . Right shoulder pain 06/30/2015  . Cervical disc disease 06/30/2015  . Essential hypertension 06/30/2015  . Breast cancer ([place]) 01/07/2014    Past Surgical History:  Procedure Laterality Date  . ABDOMINAL EXPLORATION SURGERY  2000  . BREAST BIOPSY Right 2010  . BREAST LUMPECTOMY Right 2010  . COLONOSCOPY  2003, 2013   Dr Alveta Heimlich, Dr. Bary Castilla  . TUBAL LIGATION  20 years ago    Family History  Problem Relation Age of Onset  . Hypertension Mother   . Hyperlipidemia Mother   . Diabetes Father   . Kidney disease Father   . Diabetes Brother   . Colon cancer Maternal Uncle   . Breast cancer Maternal Aunt     Social History   Social History  . Marital status: Married    Spouse name: N/A  . Number of children: N/A  . Years of education: N/A   Occupational History  . Not on file.   Social History Main Topics  . Smoking status: Former Smoker    Packs/day: 0.25    Years: 10.00    Types: Cigarettes  . Smokeless tobacco: Never Used  .  Alcohol use 0.0 oz/week  . Drug use: No  . Sexual activity: Not Currently     Comment: husband has ED   Other Topics Concern  . Not on file   Social History Narrative  . No narrative on file     Current Outpatient Prescriptions:  .  Blood Glucose Monitoring Suppl (ONETOUCH VERIO) w/Device KIT, 1 Device by Does not apply route once., Disp: 1 kit, Rfl: 0 .  BYSTOLIC 10 MG tablet, Take 1 tablet by mouth  daily, Disp: 90 tablet, Rfl: 1 .  calcium carbonate 200 MG capsule, Take 250 mg by mouth 2 (two) times daily with a meal., Disp: , Rfl:  .  cholecalciferol (VITAMIN D) 1000 UNITS tablet, Take 1,000 Units by mouth daily., Disp: , Rfl:  .  cyclobenzaprine (FLEXERIL) 5 MG tablet, 1-2 TABLET, ORAL,  EVERY EIGHT HOURS, AS NEEDED, Disp: 60 tablet, Rfl: 1 .  fluticasone (FLONASE) 50 MCG/ACT nasal spray, PLACE 2 SPRAYS INTO BOTH NOSTRILS DAILY., Disp: 16 g, Rfl: 0 .  glucose blood (ONETOUCH VERIO) test strip, Use as instructed, Disp: 100 each, Rfl: 12 .  ibuprofen (ADVIL,MOTRIN) 800 MG tablet, Take 1 tablet (800 mg total) by mouth 3 (three) times daily., Disp: 90 tablet, Rfl: 0 .  insulin aspart (NOVOLOG) 100 UNIT/ML FlexPen, Inject into the skin., Disp: , Rfl:  .  insulin glargine (LANTUS) 100 UNIT/ML injection, Inject 20 Units into the skin at bedtime. , Disp: , Rfl:  .  Lancets (ONETOUCH ULTRASOFT) lancets, Use as instructed, Disp: 100 each, Rfl: 3 .  LORazepam (ATIVAN) 0.5 MG tablet, Take 1 tablet (0.5 mg total) by mouth daily as needed for anxiety., Disp: 30 tablet, Rfl: 0 .  lovastatin (MEVACOR) 40 MG tablet, TAKE 1 TABLET BY MOUTH EVERY NIGHT AT BEDTIME, Disp: 30 tablet, Rfl: 0 .  traZODone (DESYREL) 50 MG tablet, TAKE 1/2 TO 1 TABLET BY  MOUTH AT BEDTIME AS NEEDED  FOR SLEEP., Disp: 90 tablet, Rfl: 1 .  triamterene-hydrochlorothiazide (MAXZIDE-25) 37.5-25 MG tablet, Take 1 tablet by mouth daily., Disp: 90 tablet, Rfl: 0  Allergies  Allergen Reactions  . Actos [Pioglitazone] Other (See Comments)    Bladder pain  . Amlodipine-Olmesartan   . Codeine   . Gabapentin     cns side effects.  . Glipizide     Other reaction(s): Abdominal pain  . Invokana [Canagliflozin] Other (See Comments)    Bladder pain  . Levaquin [Levofloxacin In D5w] Swelling  . Lisinopril Swelling    Angioedema  . Metformin And Related Nausea Only  . Onglyza [Saxagliptin] Other (See Comments)    Increased PVC's  . Tramadol Nausea And Vomiting     ROS  Constitutional: Negative for fever, positive for weight change.  Respiratory: Negative for cough and shortness of breath.   Cardiovascular: Negative for chest pain or palpitations.  Gastrointestinal: Negative for abdominal pain, no bowel changes.   Musculoskeletal: Negative for gait problem or joint swelling.  Skin: Negative for rash.  Neurological: Negative for dizziness or headache.  No other specific complaints in a complete review of systems (except as listed in HPI above).  Objective  Vitals:   09/03/16 1040  BP: 138/72  Pulse: 68  Resp: 16  Temp: 98.6 F (37 C)  TempSrc: Oral  SpO2: 98%  Weight: 220 lb 6.4 oz (100 kg)  Height: '5\' 6"'  (1.676 m)    Body mass index is 35.57 kg/m.  Physical Exam  Constitutional: Patient appears well-developed and well-nourished. No distress.  HENT: Head: Normocephalic and atraumatic. Ears: B TMs ok, no erythema or effusion; Nose: Nose normal. Mouth/Throat: Oropharynx is clear and moist. No oropharyngeal exudate.  Eyes: Conjunctivae and EOM are normal. Pupils are equal, round, and reactive to light. No scleral icterus.  Neck: Normal range of motion. Neck supple. No JVD present. No thyromegaly present.  Cardiovascular: Normal rate, regular rhythm and normal heart sounds.  No murmur heard. trace  BLE edema. Pulmonary/Chest: Effort normal and breath sounds normal. No respiratory distress. Abdominal: Soft. Bowel sounds are normal, no distension. There is no tenderness. no masses Breast: no lumps or masses, no nipple discharge or rashes, scars on right breast well healed and non-tender FEMALE GENITALIA:  External genitalia normal External urethra normal Pelvic not done RECTAL: not done Musculoskeletal: Normal range of motion, no joint effusions. Mild pain during palpation of PID of left middle finger, and hammer toe Neurological: he is alert and oriented to person, place, and time. No cranial nerve deficit. Coordination, balance, strength, speech and gait are normal.  Skin: Skin is warm and dry. No rash noted.  Psychiatric: Patient has a normal mood and affect. behavior is normal. Judgment and thought content normal.  PHQ2/9: Depression screen [facility] 2/9 09/03/2016 06/02/2016 04/30/2016  11/04/2015 10/03/2015  Decreased Interest 0 0 0 0 0  Down, Depressed, Hopeless 0 0 0 0 0  PHQ - 2 Score 0 0 0 0 0     Fall Risk: Fall Risk  09/03/2016 06/02/2016 04/30/2016 11/04/2015 10/03/2015  Falls in the past year? No No No No No     Functional Status Survey: Is the patient deaf or have difficulty hearing?: No Does the patient have difficulty seeing, even when wearing glasses/contacts?: No Does the patient have difficulty concentrating, remembering, or making decisions?: No Does the patient have difficulty walking or climbing stairs?: No Does the patient have difficulty dressing or bathing?: No Does the patient have difficulty doing errands alone such as visiting a doctor's office or shopping?: No    Assessment & Plan    1. Well woman exam  Discussed importance of 150 minutes of physical activity weekly, eat two servings of fish weekly, eat one serving of tree nuts ( cashews, pistachios, pecans, almonds.Marland Kitchen) every other day, eat 6 servings of fruit/vegetables daily and drink plenty of water and avoid sweet beverages.   2. Hyperlipidemia  - Lipid panel - atorvastatin (LIPITOR) 40 MG tablet; Take 1 tablet (40 mg total) by mouth daily. In place of Lovastatin  Dispense: 90 tablet; Refill: 3  3. Finger pain, left  - Ambulatory referral to Orthopedic Surgery  4. Essential hypertension  - COMPLETE METABOLIC PANEL WITH GFR  5. Other fatigue  - COMPLETE METABOLIC PANEL WITH GFR - Lipid panel - VITAMIN D 25 Hydroxy (Vit-D Deficiency, Fractures) - Vitamin B12 - TSH   6. History of breast cancer in female  Keep follow up with Dr. Fleet Contras  7. HPV test positive  - PapLb, HPV, rfx16/18  8. Cervical cancer screening  - PapLb, HPV, rfx16/18   9. Needs flu shot  - Flu Vaccine QUAD 36+ mos IM

## 2016-09-07 ENCOUNTER — Other Ambulatory Visit: Payer: Self-pay | Admitting: Family Medicine

## 2016-09-07 LAB — PAPLB, HPV, RFX16/18
HPV, HIGH-RISK: NEGATIVE
PAP Smear Comment: 0

## 2016-09-08 LAB — COMPREHENSIVE METABOLIC PANEL
ALK PHOS: 108 IU/L (ref 39–117)
ALT: 12 IU/L (ref 0–32)
AST: 14 IU/L (ref 0–40)
Albumin/Globulin Ratio: 1.5 (ref 1.2–2.2)
Albumin: 4.1 g/dL (ref 3.6–4.8)
BUN / CREAT RATIO: 13 (ref 12–28)
BUN: 12 mg/dL (ref 8–27)
Bilirubin Total: 0.3 mg/dL (ref 0.0–1.2)
CALCIUM: 9.6 mg/dL (ref 8.7–10.3)
CO2: 28 mmol/L (ref 18–29)
CREATININE: 0.89 mg/dL (ref 0.57–1.00)
Chloride: 101 mmol/L (ref 96–106)
GFR, EST AFRICAN AMERICAN: 79 mL/min/{1.73_m2} (ref >59)
GFR, EST NON AFRICAN AMERICAN: 69 mL/min/{1.73_m2} (ref >59)
GLUCOSE: 99 mg/dL (ref 65–99)
Globulin, Total: 2.8 g/dL (ref 1.5–4.5)
Potassium: 4.3 mmol/L (ref 3.5–5.2)
Sodium: 142 mmol/L (ref 134–144)
Total Protein: 6.9 g/dL (ref 6.0–8.5)

## 2016-09-08 LAB — LIPID PANEL WITH LDL/HDL RATIO
Cholesterol, Total: 157 mg/dL (ref 100–199)
HDL: 56 mg/dL (ref >39)
LDL CALC: 87 mg/dL (ref 0–99)
LDL/HDL RATIO: 1.6 ratio (ref 0.0–3.2)
Triglycerides: 70 mg/dL (ref 0–149)
VLDL CHOLESTEROL CAL: 14 mg/dL (ref 5–40)

## 2016-09-08 LAB — VITAMIN D 25 HYDROXY (VIT D DEFICIENCY, FRACTURES): VIT D 25 HYDROXY: 21 ng/mL — AB (ref 30.0–100.0)

## 2016-09-08 LAB — TSH: TSH: 1.01 u[IU]/mL (ref 0.450–4.500)

## 2016-09-08 LAB — VITAMIN B12: VITAMIN B 12: 380 pg/mL (ref 211–946)

## 2016-09-09 ENCOUNTER — Encounter: Payer: Self-pay | Admitting: Family Medicine

## 2016-09-09 ENCOUNTER — Other Ambulatory Visit: Payer: Self-pay | Admitting: Family Medicine

## 2016-09-09 DIAGNOSIS — E559 Vitamin D deficiency, unspecified: Secondary | ICD-10-CM | POA: Insufficient documentation

## 2016-09-09 MED ORDER — VITAMIN D (ERGOCALCIFEROL) 1.25 MG (50000 UNIT) PO CAPS: 50000.0000 [IU] | ORAL_CAPSULE | ORAL | 0 refills | Status: DC

## 2016-09-10 ENCOUNTER — Other Ambulatory Visit: Payer: Self-pay | Admitting: Family Medicine

## 2016-09-10 MED ORDER — VITAMIN D (ERGOCALCIFEROL) 1.25 MG (50000 UNIT) PO CAPS: 50000.0000 [IU] | ORAL_CAPSULE | ORAL | 0 refills | Status: DC

## 2016-09-10 NOTE — Progress Notes (Unsigned)
Patient wanted Vitamin D to go to local pharmacy.

## 2016-09-10 NOTE — Progress Notes (Signed)
done

## 2016-09-14 ENCOUNTER — Other Ambulatory Visit: Payer: Self-pay

## 2016-09-14 MED ORDER — NEBIVOLOL HCL 10 MG PO TABS: 10.0000 mg | ORAL_TABLET | Freq: Every day | ORAL | 1 refills | Status: DC

## 2016-09-14 NOTE — Telephone Encounter (Signed)
Patient requesting refill of Bystolic to [facility].

## 2016-09-20 ENCOUNTER — Other Ambulatory Visit: Payer: Self-pay

## 2016-09-20 MED ORDER — ATORVASTATIN CALCIUM 40 MG PO TABS: 40.0000 mg | ORAL_TABLET | Freq: Every day | ORAL | 3 refills | Status: DC

## 2016-09-20 NOTE — Telephone Encounter (Signed)
Pharmacy [facility] requesting refill of [facility]vastatin instead of [facility].

## 2016-09-28 DIAGNOSIS — Z6835 Body mass index (BMI) 35.0-35.9, adult: Secondary | ICD-10-CM | POA: Diagnosis not present

## 2016-09-28 DIAGNOSIS — E119 Type 2 diabetes mellitus without complications: Secondary | ICD-10-CM | POA: Diagnosis not present

## 2016-09-28 DIAGNOSIS — E6609 Other obesity due to excess calories: Secondary | ICD-10-CM | POA: Diagnosis not present

## 2016-09-29 ENCOUNTER — Encounter: Payer: 59 | Admitting: Family Medicine

## 2016-10-20 ENCOUNTER — Other Ambulatory Visit: Payer: Self-pay | Admitting: Family Medicine

## 2016-10-20 DIAGNOSIS — J302 Other seasonal allergic rhinitis: Secondary | ICD-10-CM

## 2016-10-25 ENCOUNTER — Telehealth: Payer: Self-pay | Admitting: Family Medicine

## 2016-10-25 NOTE — Telephone Encounter (Signed)
errenous °

## 2016-10-27 ENCOUNTER — Other Ambulatory Visit: Payer: Self-pay | Admitting: Emergency Medicine

## 2016-10-27 MED ORDER — NEBIVOLOL HCL 10 MG PO TABS: 10.0000 mg | ORAL_TABLET | Freq: Every day | ORAL | 0 refills | Status: DC

## 2016-10-28 ENCOUNTER — Ambulatory Visit (INDEPENDENT_AMBULATORY_CARE_PROVIDER_SITE_OTHER): Payer: PPO | Admitting: Family Medicine

## 2016-10-28 ENCOUNTER — Encounter: Payer: Self-pay | Admitting: Family Medicine

## 2016-10-28 VITALS — BP 128/80 | HR 68 | Temp 98.1°F | Resp 16 | Wt 213.2 lb

## 2016-10-28 DIAGNOSIS — R05 Cough: Secondary | ICD-10-CM | POA: Diagnosis not present

## 2016-10-28 DIAGNOSIS — K449 Diaphragmatic hernia without obstruction or gangrene: Secondary | ICD-10-CM

## 2016-10-28 DIAGNOSIS — G43909 Migraine, unspecified, not intractable, without status migrainosus: Secondary | ICD-10-CM | POA: Diagnosis not present

## 2016-10-28 DIAGNOSIS — M940 Chondrocostal junction syndrome [Tietze]: Secondary | ICD-10-CM | POA: Diagnosis not present

## 2016-10-28 DIAGNOSIS — G43009 Migraine without aura, not intractable, without status migrainosus: Secondary | ICD-10-CM | POA: Diagnosis not present

## 2016-10-28 DIAGNOSIS — K219 Gastro-esophageal reflux disease without esophagitis: Secondary | ICD-10-CM | POA: Diagnosis not present

## 2016-10-28 DIAGNOSIS — H9312 Tinnitus, left ear: Secondary | ICD-10-CM | POA: Diagnosis not present

## 2016-10-28 DIAGNOSIS — R059 Cough, unspecified: Secondary | ICD-10-CM

## 2016-10-28 MED ORDER — PREDNISONE 10 MG (48) PO TBPK: ORAL_TABLET | Freq: Every day | ORAL | 0 refills | Status: DC

## 2016-10-28 MED ORDER — TOPIRAMATE 50 MG PO TABS: 25.0000 mg | ORAL_TABLET | Freq: Two times a day (BID) | ORAL | 0 refills | Status: DC

## 2016-10-28 MED ORDER — OMEPRAZOLE 40 MG PO CPDR: 40.0000 mg | DELAYED_RELEASE_CAPSULE | Freq: Every day | ORAL | 0 refills | Status: DC

## 2016-10-28 MED ORDER — SUMATRIPTAN SUCCINATE 50 MG PO TABS: 50.0000 mg | ORAL_TABLET | ORAL | 0 refills | Status: DC | PRN

## 2016-10-28 NOTE — Progress Notes (Signed)
Name: Morgan Peterson   MRN: [id number]    DOB: July 18, 1951   Date:10/28/2016       Progress Note  Subjective  Chief Complaint  Chief Complaint  Patient presents with  . Cough    pt stated that she has chest discomfort due to her cough  . Sinusitis  . Gastroesophageal Reflux    HPI  Cough: she states that since 08/2016 when she was given the flu shot she noticed a dry cough, no rhinorrhea or congestion, she states worse at night. Not present when standing. She states sometimes she coughs so hard that her substernal area burns. She has a history of hiatal hernia and reflux, but denies indigestion. Symptoms triggered by sodas also  Left side tinnitus/headaches: seen by Dr. Pryor Ochoa in the past. She states that over the past month she has noticed intermittent left side headache ( goes from left temporal area to nuchal area , frontal and left maxillary area), associated with left side tinnitus. Pain is described as sharp, and sore to touch. Associated with photophobia and phonophobia. She has a remote history of migraine. She does not recall what worked for migraines in the past. She denies hearing loss or dizziness   Patient Active Problem List   Diagnosis Date Noted  . Vitamin D deficiency 09/09/2016  . Hiatal hernia 06/02/2016  . Allergic rhinitis, seasonal 06/02/2016  . Adult BMI 30+ 04/30/2016  . Angio-edema 04/30/2016  . GAD (generalized anxiety disorder) 11/04/2015  . Hyperlipidemia 11/04/2015  . Cervical radiculopathy at C6 07/31/2015  . Neuroma digital nerve 07/31/2015  . Type 2 diabetes mellitus without complication ([place]) 20/25/4270  . Right shoulder pain 06/30/2015  . Cervical disc disease 06/30/2015  . Essential hypertension 06/30/2015  . History of breast cancer in female 01/07/2014    Past Surgical History:  Procedure Laterality Date  . ABDOMINAL EXPLORATION SURGERY  2000  . BREAST BIOPSY Right 2010  . BREAST LUMPECTOMY Right 2010  . COLONOSCOPY  2003, 2013   Dr  Alveta Heimlich, Dr. Bary Castilla  . TUBAL LIGATION  20 years ago    Family History  Problem Relation Age of Onset  . Hypertension Mother   . Hyperlipidemia Mother   . Diabetes Father   . Kidney disease Father   . Diabetes Brother   . Colon cancer Maternal Uncle   . Breast cancer Maternal Aunt     Social History   Social History  . Marital status: Married    Spouse name: N/A  . Number of children: N/A  . Years of education: N/A   Occupational History  . Not on file.   Social History Main Topics  . Smoking status: Former Smoker    Packs/day: 0.25    Years: 10.00    Types: Cigarettes  . Smokeless tobacco: Never Used  . Alcohol use 0.0 oz/week  . Drug use: No  . Sexual activity: Not Currently     Comment: husband has ED   Other Topics Concern  . Not on file   Social History Narrative  . No narrative on file     Current Outpatient Prescriptions:  .  atorvastatin (LIPITOR) 40 MG tablet, Take 1 tablet (40 mg total) by mouth daily. In place of Lovastatin, Disp: 90 tablet, Rfl: 3 .  Blood Glucose Monitoring Suppl (ONETOUCH VERIO) w/Device KIT, 1 Device by Does not apply route once., Disp: 1 kit, Rfl: 0 .  calcium carbonate 200 MG capsule, Take 250 mg by mouth 2 (two) times daily with  a meal., Disp: , Rfl:  .  cholecalciferol (VITAMIN D) 1000 UNITS tablet, Take 1,000 Units by mouth daily., Disp: , Rfl:  .  cyclobenzaprine (FLEXERIL) 5 MG tablet, 1-2 TABLET, ORAL, EVERY EIGHT HOURS, AS NEEDED, Disp: 60 tablet, Rfl: 1 .  fluticasone (FLONASE) 50 MCG/ACT nasal spray, PLACE 2 SPRAYS INTO BOTH NOSTRILS DAILY., Disp: 16 g, Rfl: 2 .  glucose blood (ONETOUCH VERIO) test strip, Use as instructed, Disp: 100 each, Rfl: 12 .  insulin aspart (NOVOLOG) 100 UNIT/ML FlexPen, Inject into the skin., Disp: , Rfl:  .  insulin glargine (LANTUS) 100 UNIT/ML injection, Inject 20 Units into the skin at bedtime. , Disp: , Rfl:  .  Lancets (ONETOUCH ULTRASOFT) lancets, Use as instructed, Disp: 100 each, Rfl:  3 .  LORazepam (ATIVAN) 0.5 MG tablet, Take 1 tablet (0.5 mg total) by mouth daily as needed for anxiety., Disp: 30 tablet, Rfl: 0 .  nebivolol (BYSTOLIC) 10 MG tablet, Take 1 tablet (10 mg total) by mouth daily., Disp: 90 tablet, Rfl: 0 .  traZODone (DESYREL) 50 MG tablet, TAKE 1/2 TO 1 TABLET BY  MOUTH AT BEDTIME AS NEEDED  FOR SLEEP., Disp: 90 tablet, Rfl: 1 .  triamterene-hydrochlorothiazide (MAXZIDE-25) 37.5-25 MG tablet, Take 1 tablet by mouth daily., Disp: 90 tablet, Rfl: 0 .  Vitamin D, Ergocalciferol, (DRISDOL) 50000 units CAPS capsule, Take 1 capsule (50,000 Units total) by mouth every 7 (seven) days., Disp: 12 capsule, Rfl: 0  Allergies  Allergen Reactions  . Actos [Pioglitazone] Other (See Comments)    Bladder pain  . Amlodipine-Olmesartan   . Codeine   . Gabapentin     cns side effects.  . Glipizide     Other reaction(s): Abdominal pain  . Invokana [Canagliflozin] Other (See Comments)    Bladder pain  . Levaquin [Levofloxacin In D5w] Swelling  . Lisinopril Swelling    Angioedema  . Metformin And Related Nausea Only  . Onglyza [Saxagliptin] Other (See Comments)    Increased PVC's  . Tramadol Nausea And Vomiting     ROS  Ten systems reviewed and is negative except as mentioned in HPI   Objective  Vitals:   10/28/16 1054  BP: 128/80  Pulse: 68  Resp: 16  Temp: 98.1 F (36.7 C)  TempSrc: Oral  SpO2: 94%  Weight: 213 lb 4 oz (96.7 kg)    Body mass index is 34.42 kg/m.  Physical Exam  Constitutional: Patient appears well-developed and well-nourished. Obese  No distress.  HEENT: head atraumatic, normocephalic, pupils equal and reactive to light, ears normal bilaterally, neck supple, throat within normal limits Cardiovascular: Normal rate, regular rhythm and normal heart sounds.  No murmur heard. No BLE edema. Pulmonary/Chest: Effort normal and breath sounds normal. No respiratory distress. Abdominal: Soft.  There is no tenderness. Psychiatric: Patient  has a normal mood and affect. behavior is normal. Judgment and thought content normal. Neurological: no focal deficit, negative Romberg  Recent Results (from the past 2160 hour(s))  PapLb, HPV, rfx16/18     Status: None   Collection Time: 09/03/16 11:52 AM  Result Value Ref Range   DIAGNOSIS: Comment     Comment: NEGATIVE FOR INTRAEPITHELIAL LESION AND MALIGNANCY. FUNGAL ORGANISMS MORPHOLOGICALLY CONSISTENT WITH CANDIDA SPECIES ARE PRESENT.    Specimen adequacy: Comment     Comment: Satisfactory for evaluation. No endocervical component is identified.   Performed by: Comment     Comment: Shon Baton, Cytotechnologist (ASCP)   PAP SMEAR COMMENT .    PATHOLOGIST PROVIDED  ICD10: Comment     Comment: R87.5   Note: Comment     Comment: The Pap smear is a screening test designed to aid in the detection of premalignant and malignant conditions of the uterine cervix.  It is not a diagnostic procedure and should not be used as the sole means of detecting cervical cancer.  Both false-positive and false-negative reports do occur.    HPV, high-risk Negative Negative    Comment: This high-risk HPV test detects thirteen high-risk types (16/18/31/33/35/39/45/51/52/56/58/59/68) without differentiation.   Comprehensive metabolic panel     Status: None   Collection Time: 09/07/16 10:41 AM  Result Value Ref Range   Glucose 99 65 - 99 mg/dL   BUN 12 8 - 27 mg/dL   Creatinine, Ser 0.89 0.57 - 1.00 mg/dL   GFR calc non Af Amer 69 >59 mL/min/1.73   GFR calc Af Amer 79 >59 mL/min/1.73   BUN/Creatinine Ratio 13 12 - 28   Sodium 142 134 - 144 mmol/L   Potassium 4.3 3.5 - 5.2 mmol/L   Chloride 101 96 - 106 mmol/L   CO2 28 18 - 29 mmol/L   Calcium 9.6 8.7 - 10.3 mg/dL   Total Protein 6.9 6.0 - 8.5 g/dL   Albumin 4.1 3.6 - 4.8 g/dL   Globulin, Total 2.8 1.5 - 4.5 g/dL   Albumin/Globulin Ratio 1.5 1.2 - 2.2   Bilirubin Total 0.3 0.0 - 1.2 mg/dL   Alkaline Phosphatase 108 39 - 117 IU/L   AST 14 0 -  40 IU/L   ALT 12 0 - 32 IU/L  Lipid Panel With LDL/HDL Ratio     Status: None   Collection Time: 09/07/16 10:41 AM  Result Value Ref Range   Cholesterol, Total 157 100 - 199 mg/dL   Triglycerides 70 0 - 149 mg/dL   HDL 56 >39 mg/dL   VLDL Cholesterol Cal 14 5 - 40 mg/dL   LDL Calculated 87 0 - 99 mg/dL   LDl/HDL Ratio 1.6 0.0 - 3.2 ratio units    Comment:                                     LDL/HDL Ratio                                             Men  Women                               1/2 Avg.Risk  1.0    1.5                                   Avg.Risk  3.6    3.2                                2X Avg.Risk  6.2    5.0                                3X Avg.Risk  8.0    6.1   TSH  Status: None   Collection Time: 09/07/16 10:41 AM  Result Value Ref Range   TSH 1.010 0.450 - 4.500 uIU/mL  VITAMIN D 25 Hydroxy (Vit-D Deficiency, Fractures)     Status: Abnormal   Collection Time: 09/07/16 10:41 AM  Result Value Ref Range   Vit D, 25-Hydroxy 21.0 (L) 30.0 - 100.0 ng/mL    Comment: Vitamin D deficiency has been defined by the [place] practice guideline as a level of serum 25-OH vitamin D less than 20 ng/mL (1,2). The Endocrine Society went on to further define vitamin D insufficiency as a level between 21 and 29 ng/mL (2). 1. IOM (Institute of Medicine). 2010. Dietary reference    intakes for calcium and D. [place]: The    [employer]. 2. Holick MF, Binkley [place], Bischoff-Ferrari HA, et al.    Evaluation, treatment, and prevention of vitamin D    deficiency: an Endocrine Society clinical practice    guideline. JCEM. 2011 Jul; 96(7):1911-30.   Vitamin B12     Status: None   Collection Time: 09/07/16 10:41 AM  Result Value Ref Range   Vitamin B-12 380 211 - 946 pg/mL    PHQ2/9: Depression screen [facility] 2/9 10/28/2016 09/03/2016 06/02/2016 04/30/2016 11/04/2015  Decreased Interest 0 0 0 0 0  Down, Depressed, Hopeless 0 0 0 0 0   PHQ - 2 Score 0 0 0 0 0     Fall Risk: Fall Risk  10/28/2016 09/03/2016 06/02/2016 04/30/2016 11/04/2015  Falls in the past year? No No No No No      Functional Status Survey: Is the patient deaf or have difficulty hearing?: No Does the patient have difficulty seeing, even when wearing glasses/contacts?: No Does the patient have difficulty concentrating, remembering, or making decisions?: No Does the patient have difficulty walking or climbing stairs?: No Does the patient have difficulty dressing or bathing?: No Does the patient have difficulty doing errands alone such as visiting a doctor's office or shopping?: No    Assessment & Plan  1. Cough  - predniSONE (STERAPRED UNI-PAK 48 TAB) 10 MG (48) TBPK tablet; Take by mouth daily.  Dispense: 48 tablet; Refill: 0 Explained that her glucose is going to go higher, currently glucose has been controlled and is only on Lantus 20 units daily, but advised to resume sliding scale if glucose if needed.   2. Hiatal hernia  - omeprazole (PRILOSEC) 40 MG capsule; Take 1 capsule (40 mg total) by mouth daily.  Dispense: 30 capsule; Refill: 0  3. Gastroesophageal reflux disease without esophagitis  - omeprazole (PRILOSEC) 40 MG capsule; Take 1 capsule (40 mg total) by mouth daily.  Dispense: 30 capsule; Refill: 0  4. Left-sided tinnitus  - Ambulatory referral to ENT - MR Brain W Wo Contrast; Future  5. Costochondritis  - predniSONE (STERAPRED UNI-PAK 48 TAB) 10 MG (48) TBPK tablet; Take by mouth daily.  Dispense: 48 tablet; Refill: 0  6. Hemicrania  - MR Brain W Wo Contrast; Future  7. Migraine without aura and without status migrainosus, not intractable  - SUMAtriptan (IMITREX) 50 MG tablet; Take 1 tablet (50 mg total) by mouth every 2 (two) hours as needed for migraine. May repeat in 2 hours if headache persists or recurs.  Dispense: 10 tablet; Refill: 0

## 2016-11-03 ENCOUNTER — Other Ambulatory Visit: Payer: Self-pay | Admitting: Family Medicine

## 2016-11-03 ENCOUNTER — Telehealth: Payer: Self-pay

## 2016-11-03 NOTE — Telephone Encounter (Signed)
Pt called and stated that the topamax does not come in a scored tablet and you instructed her to take half also she stated that she likes to have a glass of wine and that the topamax suggested against drinking while on that medication so she would like something else

## 2016-11-04 NOTE — Telephone Encounter (Signed)
Pt is aware of that and would like something else prescribed

## 2016-11-04 NOTE — Telephone Encounter (Signed)
She is seeing ENT, stay off Topamax and we will discuss it again when she returns for follow up

## 2016-11-04 NOTE — Telephone Encounter (Signed)
Spoke to pt

## 2016-11-04 NOTE — Telephone Encounter (Signed)
She can't drink on Topamax.

## 2016-11-08 ENCOUNTER — Other Ambulatory Visit: Payer: Self-pay

## 2016-11-08 DIAGNOSIS — C50411 Malignant neoplasm of upper-outer quadrant of right female breast: Secondary | ICD-10-CM

## 2016-11-24 ENCOUNTER — Other Ambulatory Visit: Payer: Self-pay | Admitting: Family Medicine

## 2016-11-24 DIAGNOSIS — K449 Diaphragmatic hernia without obstruction or gangrene: Secondary | ICD-10-CM

## 2016-11-24 DIAGNOSIS — K219 Gastro-esophageal reflux disease without esophagitis: Secondary | ICD-10-CM

## 2016-11-26 ENCOUNTER — Ambulatory Visit (INDEPENDENT_AMBULATORY_CARE_PROVIDER_SITE_OTHER): Payer: PPO | Admitting: Family Medicine

## 2016-11-26 ENCOUNTER — Encounter: Payer: Self-pay | Admitting: Family Medicine

## 2016-11-26 VITALS — BP 140/78 | HR 78 | Temp 99.2°F | Resp 18 | Ht 66.0 in | Wt 213.3 lb

## 2016-11-26 DIAGNOSIS — G43009 Migraine without aura, not intractable, without status migrainosus: Secondary | ICD-10-CM

## 2016-11-26 DIAGNOSIS — H9312 Tinnitus, left ear: Secondary | ICD-10-CM

## 2016-11-26 DIAGNOSIS — R05 Cough: Secondary | ICD-10-CM | POA: Diagnosis not present

## 2016-11-26 DIAGNOSIS — G43909 Migraine, unspecified, not intractable, without status migrainosus: Secondary | ICD-10-CM

## 2016-11-26 DIAGNOSIS — I10 Essential (primary) hypertension: Secondary | ICD-10-CM | POA: Diagnosis not present

## 2016-11-26 DIAGNOSIS — Z23 Encounter for immunization: Secondary | ICD-10-CM | POA: Diagnosis not present

## 2016-11-26 DIAGNOSIS — R059 Cough, unspecified: Secondary | ICD-10-CM

## 2016-11-26 NOTE — Progress Notes (Signed)
Name: Morgan Peterson   MRN: [id number]    DOB: 1951/04/17   Date:11/26/2016       Progress Note  Subjective  Chief Complaint  Chief Complaint  Patient presents with  . Cough    Patient states she feels much better but had to stop the Prednisone, high blood sugars in the 400's and leg discomfort. Patient took 7 days worst but states she felt like her chest was going to explode.    . Sinus Problem    Sinus Drainage patient has not had a chance to call ENT.  Unable to take alot due to DM 2 going on for 2 months. States inhaler has helped and drinking warm wine for congestion.   Marland Kitchen Headache    HPI  Cough: she is doing much better, she could not tolerate taking the entire pack of prednisone, caused elevated of glucose and also leg discomfort and tachycardia. She states she only coughs in am's after a warm shower. She has proair but no need to use it lately.   Tinnitus: she will re-schedule follow up with ENT, did not get MRI brain as recommended. She states only had three episodes since last visit, and may be secondary to migraines  Migraine headaches: she is not taking Topamax because she likes drinking red wine occasionally. She states that since last month her headache only happened a total of 3 times. She is going to bed earlier and sleeping without have to take Trazodone, not working as much, balancing her life and is doing well. She also states that Imitrex aborts migraine within 30 minutes. No side effects. Headache is described as left side of head, dull but intense and associated with photophobia.   HTN: bp is elevated today, but it was at goal on her last visit, she states she took medication later than usual today. No chest pain or palpitation   Patient Active Problem List   Diagnosis Date Noted  . Vitamin D deficiency 09/09/2016  . Hiatal hernia 06/02/2016  . Allergic rhinitis, seasonal 06/02/2016  . Adult BMI 30+ 04/30/2016  . Angio-edema 04/30/2016  . GAD (generalized  anxiety disorder) 11/04/2015  . Hyperlipidemia 11/04/2015  . Cervical radiculopathy at C6 07/31/2015  . Neuroma digital nerve 07/31/2015  . Type 2 diabetes mellitus without complication ([place]) 65/56/3893  . Right shoulder pain 06/30/2015  . Cervical disc disease 06/30/2015  . Essential hypertension 06/30/2015  . History of breast cancer in female 01/07/2014    Past Surgical History:  Procedure Laterality Date  . ABDOMINAL EXPLORATION SURGERY  2000  . BREAST BIOPSY Right 2010  . BREAST LUMPECTOMY Right 2010  . COLONOSCOPY  2003, 2013   Dr Alveta Heimlich, Dr. Bary Castilla  . TUBAL LIGATION  20 years ago    Family History  Problem Relation Age of Onset  . Hypertension Mother   . Hyperlipidemia Mother   . Diabetes Father   . Kidney disease Father   . Diabetes Brother   . Colon cancer Maternal Uncle   . Breast cancer Maternal Aunt     Social History   Social History  . Marital status: Married    Spouse name: N/A  . Number of children: N/A  . Years of education: N/A   Occupational History  . Not on file.   Social History Main Topics  . Smoking status: Former Smoker    Packs/day: 0.25    Years: 10.00    Types: Cigarettes  . Smokeless tobacco: Never Used  . Alcohol use  0.0 oz/week  . Drug use: No  . Sexual activity: Not Currently     Comment: husband has ED   Other Topics Concern  . Not on file   Social History Narrative  . No narrative on file     Current Outpatient Prescriptions:  .  albuterol (PROVENTIL HFA;VENTOLIN HFA) 108 (90 Base) MCG/ACT inhaler, Inhale 2 puffs into the lungs every 6 (six) hours as needed for wheezing or shortness of breath., Disp: , Rfl:  .  atorvastatin (LIPITOR) 40 MG tablet, Take 1 tablet (40 mg total) by mouth daily. In place of Lovastatin, Disp: 90 tablet, Rfl: 3 .  Blood Glucose Monitoring Suppl (ONETOUCH VERIO) w/Device KIT, 1 Device by Does not apply route once., Disp: 1 kit, Rfl: 0 .  calcium carbonate 200 MG capsule, Take 250 mg by mouth 2  (two) times daily with a meal., Disp: , Rfl:  .  cholecalciferol (VITAMIN D) 1000 UNITS tablet, Take 1,000 Units by mouth daily., Disp: , Rfl:  .  fluticasone (FLONASE) 50 MCG/ACT nasal spray, PLACE 2 SPRAYS INTO BOTH NOSTRILS DAILY., Disp: 16 g, Rfl: 2 .  glucose blood (ONETOUCH VERIO) test strip, Use as instructed, Disp: 100 each, Rfl: 12 .  insulin glargine (LANTUS) 100 UNIT/ML injection, Inject 20 Units into the skin at bedtime. , Disp: , Rfl:  .  Lancets (ONETOUCH ULTRASOFT) lancets, Use as instructed, Disp: 100 each, Rfl: 3 .  LORazepam (ATIVAN) 0.5 MG tablet, Take 1 tablet (0.5 mg total) by mouth daily as needed for anxiety., Disp: 30 tablet, Rfl: 0 .  nebivolol (BYSTOLIC) 10 MG tablet, Take 1 tablet (10 mg total) by mouth daily., Disp: 90 tablet, Rfl: 0 .  omeprazole (PRILOSEC) 40 MG capsule, TAKE 1 CAPSULE (40 MG TOTAL) BY MOUTH DAILY., Disp: 30 capsule, Rfl: 0 .  SUMAtriptan (IMITREX) 50 MG tablet, Take 1 tablet (50 mg total) by mouth every 2 (two) hours as needed for migraine. May repeat in 2 hours if headache persists or recurs., Disp: 10 tablet, Rfl: 0 .  triamterene-hydrochlorothiazide (MAXZIDE-25) 37.5-25 MG tablet, TAKE 1 TABLET BY MOUTH  DAILY, Disp: 90 tablet, Rfl: 0 .  Vitamin D, Ergocalciferol, (DRISDOL) 50000 units CAPS capsule, Take 1 capsule (50,000 Units total) by mouth every 7 (seven) days., Disp: 12 capsule, Rfl: 0 .  insulin aspart (NOVOLOG) 100 UNIT/ML FlexPen, Inject into the skin., Disp: , Rfl:   Allergies  Allergen Reactions  . Actos [Pioglitazone] Other (See Comments)    Bladder pain  . Amlodipine-Olmesartan   . Codeine   . Gabapentin     cns side effects.  . Glipizide     Other reaction(s): Abdominal pain  . Invokana [Canagliflozin] Other (See Comments)    Bladder pain  . Levaquin [Levofloxacin In D5w] Swelling  . Lisinopril Swelling    Angioedema  . Metformin And Related Nausea Only  . Onglyza [Saxagliptin] Other (See Comments)    Increased PVC's  .  Tramadol Nausea And Vomiting     ROS  Constitutional: Negative for fever or weight change.  Respiratory: positive  for mild cough but no  shortness of breath.   Cardiovascular: Negative for chest pain or palpitations.  Gastrointestinal: Negative for abdominal pain, no bowel changes.  Musculoskeletal: Negative for gait problem or joint swelling.  Skin: Negative for rash.  Neurological: Negative for dizziness , positive headache.  No other specific complaints in a complete review of systems (except as listed in HPI above).  Objective  Vitals:   11/26/16  1156  BP: (!) 144/84  Pulse: 78  Resp: 18  Temp: 99.2 F (37.3 C)  TempSrc: Oral  SpO2: 94%  Weight: 213 lb 4.8 oz (96.8 kg)  Height: _0  (1.676 m)    Body mass index is 34.43 kg/m.  Physical Exam  Constitutional: Patient appears well-developed and well-nourished. Obese  No distress.  HEENT: head atraumatic, normocephalic, pupils equal and reactive to light,  neck supple, throat within normal limits Cardiovascular: Normal rate, regular rhythm and normal heart sounds.  No murmur heard. No BLE edema. Pulmonary/Chest: Effort normal and breath sounds normal. No respiratory distress. Abdominal: Soft.  There is no tenderness. Psychiatric: Patient has a normal mood and affect. behavior is normal. Judgment and thought content normal.   Recent Results (from the past 2160 hour(s))  PapLb, HPV, rfx16/18     Status: None   Collection Time: 09/03/16 11:52 AM  Result Value Ref Range   DIAGNOSIS: Comment     Comment: NEGATIVE FOR INTRAEPITHELIAL LESION AND MALIGNANCY. FUNGAL ORGANISMS MORPHOLOGICALLY CONSISTENT WITH CANDIDA SPECIES ARE PRESENT.    Specimen adequacy: Comment     Comment: Satisfactory for evaluation. No endocervical component is identified.   Performed by: Comment     Comment: Shon Baton, Cytotechnologist (ASCP)   PAP SMEAR COMMENT .    PATHOLOGIST PROVIDED ICD10: Comment     Comment: R87.5   Note: Comment      Comment: The Pap smear is a screening test designed to aid in the detection of premalignant and malignant conditions of the uterine cervix.  It is not a diagnostic procedure and should not be used as the sole means of detecting cervical cancer.  Both false-positive and false-negative reports do occur.    HPV, high-risk Negative Negative    Comment: This high-risk HPV test detects thirteen high-risk types (16/18/31/33/35/39/45/51/52/56/58/59/68) without differentiation.   Comprehensive metabolic panel     Status: None   Collection Time: 09/07/16 10:41 AM  Result Value Ref Range   Glucose 99 65 - 99 mg/dL   BUN 12 8 - 27 mg/dL   Creatinine, Ser 0.89 0.57 - 1.00 mg/dL   GFR calc non Af Amer 69 >59 mL/min/1.73   GFR calc Af Amer 79 >59 mL/min/1.73   BUN/Creatinine Ratio 13 12 - 28   Sodium 142 134 - 144 mmol/L   Potassium 4.3 3.5 - 5.2 mmol/L   Chloride 101 96 - 106 mmol/L   CO2 28 18 - 29 mmol/L   Calcium 9.6 8.7 - 10.3 mg/dL   Total Protein 6.9 6.0 - 8.5 g/dL   Albumin 4.1 3.6 - 4.8 g/dL   Globulin, Total 2.8 1.5 - 4.5 g/dL   Albumin/Globulin Ratio 1.5 1.2 - 2.2   Bilirubin Total 0.3 0.0 - 1.2 mg/dL   Alkaline Phosphatase 108 39 - 117 IU/L   AST 14 0 - 40 IU/L   ALT 12 0 - 32 IU/L  Lipid Panel With LDL/HDL Ratio     Status: None   Collection Time: 09/07/16 10:41 AM  Result Value Ref Range   Cholesterol, Total 157 100 - 199 mg/dL   Triglycerides 70 0 - 149 mg/dL   HDL 56 >39 mg/dL   VLDL Cholesterol Cal 14 5 - 40 mg/dL   LDL Calculated 87 0 - 99 mg/dL   LDl/HDL Ratio 1.6 0.0 - 3.2 ratio units    Comment:  LDL/HDL Ratio                                             Men  Women                               1/2 Avg.Risk  1.0    1.5                                   Avg.Risk  3.6    3.2                                2X Avg.Risk  6.2    5.0                                3X Avg.Risk  8.0    6.1   TSH     Status: None   Collection Time:  09/07/16 10:41 AM  Result Value Ref Range   TSH 1.010 0.450 - 4.500 uIU/mL  VITAMIN D 25 Hydroxy (Vit-D Deficiency, Fractures)     Status: Abnormal   Collection Time: 09/07/16 10:41 AM  Result Value Ref Range   Vit D, 25-Hydroxy 21.0 (L) 30.0 - 100.0 ng/mL    Comment: Vitamin D deficiency has been defined by the [place] and an Endocrine Society practice guideline as a level of serum 25-OH vitamin D less than 20 ng/mL (1,2). The Endocrine Society went on to further define vitamin D insufficiency as a level between 21 and 29 ng/mL (2). 1. IOM (Institute of Medicine). 2010. Dietary reference    intakes for calcium and D. [place]: The    [employer]. 2. Holick MF, Binkley [place], Bischoff-Ferrari HA, et al.    Evaluation, treatment, and prevention of vitamin D    deficiency: an Endocrine Society clinical practice    guideline. JCEM. 2011 Jul; 96(7):1911-30.   Vitamin B12     Status: None   Collection Time: 09/07/16 10:41 AM  Result Value Ref Range   Vitamin B-12 380 211 - 946 pg/mL      PHQ2/9: Depression screen [facility] 2/9 10/28/2016 09/03/2016 06/02/2016 04/30/2016 11/04/2015  Decreased Interest 0 0 0 0 0  Down, Depressed, Hopeless 0 0 0 0 0  PHQ - 2 Score 0 0 0 0 0     Fall Risk: Fall Risk  10/28/2016 09/03/2016 06/02/2016 04/30/2016 11/04/2015  Falls in the past year? _0      Assessment & Plan  1. Migraine without aura and without status migrainosus, not intractable  She will continue current regiment, does not want to take Topamax, but responding to Imitrex  2. Left-sided tinnitus  Advised to follow up with ENT and had MRI as recommended.   3. Hemicrania  Did not go for MRI, likely from migraine  4. Essential hypertension  Continue medication, recheck in 2 months during her next visit   5. Cough  Doing well now, she still has proair at home.   6. Need for pneumococcal vaccination  - Pneumococcal polysaccharide vaccine  23-valent greater than or equal to 2yo subcutaneous/IM

## 2016-11-30 DIAGNOSIS — E1165 Type 2 diabetes mellitus with hyperglycemia: Secondary | ICD-10-CM | POA: Diagnosis not present

## 2016-11-30 DIAGNOSIS — E041 Nontoxic single thyroid nodule: Secondary | ICD-10-CM | POA: Diagnosis not present

## 2016-11-30 DIAGNOSIS — Z6835 Body mass index (BMI) 35.0-35.9, adult: Secondary | ICD-10-CM | POA: Diagnosis not present

## 2016-11-30 DIAGNOSIS — E6609 Other obesity due to excess calories: Secondary | ICD-10-CM | POA: Diagnosis not present

## 2016-11-30 DIAGNOSIS — Z794 Long term (current) use of insulin: Secondary | ICD-10-CM | POA: Diagnosis not present

## 2016-11-30 LAB — LIPID PANEL
CHOLESTEROL: 148 mg/dL (ref 0–200)
HDL: 40 mg/dL (ref 35–70)
LDL Cholesterol: 90 mg/dL
TRIGLYCERIDES: 88 mg/dL (ref 40–160)

## 2016-12-10 ENCOUNTER — Ambulatory Visit: Payer: PPO

## 2016-12-28 DIAGNOSIS — J320 Chronic maxillary sinusitis: Secondary | ICD-10-CM | POA: Diagnosis not present

## 2017-01-04 ENCOUNTER — Encounter: Payer: Self-pay | Admitting: *Deleted

## 2017-01-05 ENCOUNTER — Other Ambulatory Visit: Payer: 59

## 2017-01-05 ENCOUNTER — Ambulatory Visit: Payer: 59

## 2017-01-10 ENCOUNTER — Ambulatory Visit: Payer: PPO | Admitting: General Surgery

## 2017-01-11 ENCOUNTER — Other Ambulatory Visit: Payer: Self-pay | Admitting: Family Medicine

## 2017-01-11 ENCOUNTER — Telehealth: Payer: Self-pay | Admitting: Family Medicine

## 2017-01-11 MED ORDER — LOVASTATIN 40 MG PO TABS: 40.0000 mg | ORAL_TABLET | Freq: Every day | ORAL | 1 refills | Status: DC

## 2017-01-11 NOTE — Telephone Encounter (Signed)
Pt was prescribed atorvastatin 40 mg and she is not able to take it. She stopped taking it on Friday. It is causing severe stomach pain. Asking that you put her back on lovastatin

## 2017-01-11 NOTE — Telephone Encounter (Signed)
Pt informed

## 2017-01-11 NOTE — Telephone Encounter (Signed)
Changed back

## 2017-01-14 ENCOUNTER — Other Ambulatory Visit: Payer: Self-pay | Admitting: Family Medicine

## 2017-01-14 MED ORDER — TRIAMTERENE-HCTZ 37.5-25 MG PO TABS: 1.0000 | ORAL_TABLET | Freq: Every day | ORAL | 0 refills | Status: DC

## 2017-01-14 NOTE — Telephone Encounter (Signed)
Pt needs refill on Triamterene to be sent to [facility]. Pt no longer uses the mail order.

## 2017-01-17 DIAGNOSIS — Z6835 Body mass index (BMI) 35.0-35.9, adult: Secondary | ICD-10-CM | POA: Diagnosis not present

## 2017-01-17 DIAGNOSIS — E041 Nontoxic single thyroid nodule: Secondary | ICD-10-CM | POA: Diagnosis not present

## 2017-01-17 DIAGNOSIS — Z794 Long term (current) use of insulin: Secondary | ICD-10-CM | POA: Diagnosis not present

## 2017-01-17 DIAGNOSIS — E1165 Type 2 diabetes mellitus with hyperglycemia: Secondary | ICD-10-CM | POA: Diagnosis not present

## 2017-01-17 DIAGNOSIS — E6609 Other obesity due to excess calories: Secondary | ICD-10-CM | POA: Diagnosis not present

## 2017-01-20 ENCOUNTER — Ambulatory Visit
Admission: RE | Admit: 2017-01-20 | Discharge: 2017-01-20 | Disposition: A | Discharge status: Home / Self Care | Payer: PPO | Source: Ambulatory Visit | Attending: General Surgery | Admitting: General Surgery

## 2017-01-20 DIAGNOSIS — C50411 Malignant neoplasm of upper-outer quadrant of right female breast: Secondary | ICD-10-CM

## 2017-01-20 DIAGNOSIS — R922 Inconclusive mammogram: Secondary | ICD-10-CM | POA: Diagnosis not present

## 2017-01-22 ENCOUNTER — Other Ambulatory Visit: Payer: Self-pay | Admitting: Family Medicine

## 2017-01-22 DIAGNOSIS — K219 Gastro-esophageal reflux disease without esophagitis: Secondary | ICD-10-CM

## 2017-01-22 DIAGNOSIS — K449 Diaphragmatic hernia without obstruction or gangrene: Secondary | ICD-10-CM

## 2017-01-24 NOTE — Telephone Encounter (Signed)
Patient requesting refill of Omeprazole to [facility]. 

## 2017-01-25 ENCOUNTER — Ambulatory Visit: Payer: PPO | Admitting: General Surgery

## 2017-01-27 ENCOUNTER — Ambulatory Visit: Payer: PPO | Admitting: Family Medicine

## 2017-01-31 ENCOUNTER — Encounter: Payer: Self-pay | Admitting: Family Medicine

## 2017-01-31 ENCOUNTER — Ambulatory Visit (INDEPENDENT_AMBULATORY_CARE_PROVIDER_SITE_OTHER): Payer: PPO | Admitting: Family Medicine

## 2017-01-31 VITALS — BP 122/78 | HR 84 | Temp 98.5°F | Resp 16 | Ht 66.0 in | Wt 207.5 lb

## 2017-01-31 DIAGNOSIS — Z794 Long term (current) use of insulin: Secondary | ICD-10-CM

## 2017-01-31 DIAGNOSIS — E7800 Pure hypercholesterolemia, unspecified: Secondary | ICD-10-CM | POA: Diagnosis not present

## 2017-01-31 DIAGNOSIS — G43009 Migraine without aura, not intractable, without status migrainosus: Secondary | ICD-10-CM

## 2017-01-31 DIAGNOSIS — I10 Essential (primary) hypertension: Secondary | ICD-10-CM | POA: Diagnosis not present

## 2017-01-31 DIAGNOSIS — H9312 Tinnitus, left ear: Secondary | ICD-10-CM | POA: Diagnosis not present

## 2017-01-31 DIAGNOSIS — F411 Generalized anxiety disorder: Secondary | ICD-10-CM

## 2017-01-31 DIAGNOSIS — E1165 Type 2 diabetes mellitus with hyperglycemia: Secondary | ICD-10-CM

## 2017-01-31 MED ORDER — LORAZEPAM 0.5 MG PO TABS: 0.5000 mg | ORAL_TABLET | Freq: Every day | ORAL | 0 refills | Status: DC | PRN

## 2017-01-31 MED ORDER — NEBIVOLOL HCL 10 MG PO TABS: 10.0000 mg | ORAL_TABLET | Freq: Every day | ORAL | 1 refills | Status: DC

## 2017-01-31 MED ORDER — SUMATRIPTAN SUCCINATE 50 MG PO TABS: 50.0000 mg | ORAL_TABLET | ORAL | 0 refills | Status: DC | PRN

## 2017-01-31 MED ORDER — LIRAGLUTIDE 18 MG/3ML ~~LOC~~ SOPN: 1.8000 mg | PEN_INJECTOR | Freq: Every day | SUBCUTANEOUS | 0 refills | Status: DC

## 2017-01-31 MED ORDER — TRIAMTERENE-HCTZ 37.5-25 MG PO TABS: 1.0000 | ORAL_TABLET | Freq: Every day | ORAL | 1 refills | Status: DC

## 2017-01-31 NOTE — Progress Notes (Signed)
Name: Morgan Peterson   MRN: [id number]    DOB: 1951/11/02   Date:01/31/2017       Progress Note  Subjective  Chief Complaint  Chief Complaint  Patient presents with  . Medication Refill    2 month follow up  . Migraine  . Hypertension  . Diabetes    followed by endocrinologist  . Hyperlipidemia    HPI  Tinnitus: she has seen Dr. Pryor Ochoa recently but it was for left maxillary sinusitis. We scheduled her MRI however she cancelled, explained importance of having it done. She denies hearing loss.   Migraine headaches: she is not taking Topamax because she likes drinking red wine occasionally. She had one episode of migraine in the past 6 weeks. Usually on the left side of head, it starts as a pressure on left temporal area and radiates to nuchal area, scalp is sensitive to touch on left occipital area, she has associated photophobia. At times she has nausea, no vomiting. She denies weakness. Symptoms improves with Imitrex and rest within one hour.   GAD: she is not on SSRI, she takes BZD very seldom, 30 pills lasted over 6 months.   HTN: bp is at goal today, she has been taking medication as prescribed, denies chest pain or palpitation  DM: seeing Endocrinologist. Dr. Elsie Stain and is now on Victoza and Lantus. She has noticed change in appetite. She has lost some weight since started on medication. No polyphagia, no polydipsia or polyuria. Reviewed records from [place], [place] was 8.5 in December, urine micro negative and lipid panel at goal  Patient Active Problem List   Diagnosis Date Noted  . Vitamin D deficiency 09/09/2016  . Hiatal hernia 06/02/2016  . Allergic rhinitis, seasonal 06/02/2016  . Adult BMI 30+ 04/30/2016  . Angio-edema 04/30/2016  . GAD (generalized anxiety disorder) 11/04/2015  . Hyperlipidemia 11/04/2015  . Cervical radiculopathy at C6 07/31/2015  . Neuroma digital nerve 07/31/2015  . Type 2 diabetes mellitus with hyperglycemia, with long-term current use of  insulin ([place]) 06/30/2015  . Right shoulder pain 06/30/2015  . Cervical disc disease 06/30/2015  . Essential hypertension 06/30/2015  . History of breast cancer in female 01/07/2014    Past Surgical History:  Procedure Laterality Date  . ABDOMINAL EXPLORATION SURGERY  2000  . BREAST BIOPSY Right 2010   +   . BREAST EXCISIONAL BIOPSY Right 2010   + mammo site inasive mammo ca  . BREAST LUMPECTOMY Right 2010  . COLONOSCOPY  2003, 2013   Dr Alveta Heimlich, Dr. Bary Castilla  . TUBAL LIGATION  20 years ago    Family History  Problem Relation Age of Onset  . Hypertension Mother   . Hyperlipidemia Mother   . Diabetes Father   . Kidney disease Father   . Diabetes Brother   . Colon cancer Maternal Uncle   . Breast cancer Maternal Aunt     Social History   Social History  . Marital status: Married    Spouse name: N/A  . Number of children: N/A  . Years of education: N/A   Occupational History  . Not on file.   Social History Main Topics  . Smoking status: Former Smoker    Packs/day: 0.25    Years: 10.00    Types: Cigarettes  . Smokeless tobacco: Never Used  . Alcohol use 0.0 oz/week  . Drug use: No  . Sexual activity: Not Currently     Comment: husband has ED   Other Topics Concern  .  Not on file   Social History Narrative  . No narrative on file     Current Outpatient Prescriptions:  .  Blood Glucose Monitoring Suppl (ONETOUCH VERIO) w/Device KIT, 1 Device by Does not apply route once., Disp: 1 kit, Rfl: 0 .  calcium carbonate 200 MG capsule, Take 250 mg by mouth 2 (two) times daily with a meal., Disp: , Rfl:  .  cholecalciferol (VITAMIN D) 1000 UNITS tablet, Take 1,000 Units by mouth daily., Disp: , Rfl:  .  fluticasone (FLONASE) 50 MCG/ACT nasal spray, PLACE 2 SPRAYS INTO BOTH NOSTRILS DAILY., Disp: 16 g, Rfl: 2 .  glucose blood (ONETOUCH VERIO) test strip, Use as instructed, Disp: 100 each, Rfl: 12 .  insulin glargine (LANTUS) 100 UNIT/ML injection, Inject 20 Units into  the skin at bedtime. , Disp: , Rfl:  .  Lancets (ONETOUCH ULTRASOFT) lancets, Use as instructed, Disp: 100 each, Rfl: 3 .  liraglutide (VICTOZA) 18 MG/3ML SOPN, Inject 0.3 mLs (1.8 mg total) into the skin daily., Disp: 9 mL, Rfl: 0 .  LORazepam (ATIVAN) 0.5 MG tablet, Take 1 tablet (0.5 mg total) by mouth daily as needed for anxiety., Disp: 30 tablet, Rfl: 0 .  lovastatin (MEVACOR) 40 MG tablet, Take 1 tablet (40 mg total) by mouth at bedtime., Disp: 90 tablet, Rfl: 1 .  nebivolol (BYSTOLIC) 10 MG tablet, Take 1 tablet (10 mg total) by mouth daily., Disp: 90 tablet, Rfl: 1 .  omeprazole (PRILOSEC) 40 MG capsule, TAKE 1 CAPSULE (40 MG TOTAL) BY MOUTH DAILY., Disp: 30 capsule, Rfl: 0 .  SUMAtriptan (IMITREX) 50 MG tablet, Take 1 tablet (50 mg total) by mouth every 2 (two) hours as needed for migraine. May repeat in 2 hours if headache persists or recurs., Disp: 10 tablet, Rfl: 0 .  triamterene-hydrochlorothiazide (MAXZIDE-25) 37.5-25 MG tablet, Take 1 tablet by mouth daily., Disp: 90 tablet, Rfl: 1 .  Vitamin D, Ergocalciferol, (DRISDOL) 50000 units CAPS capsule, Take 1 capsule (50,000 Units total) by mouth every 7 (seven) days., Disp: 12 capsule, Rfl: 0  Allergies  Allergen Reactions  . Actos [Pioglitazone] Other (See Comments)    Bladder pain  . Amlodipine-Olmesartan   . Codeine   . Gabapentin     cns side effects.  . Glipizide     Other reaction(s): Abdominal pain  . Invokana [Canagliflozin] Other (See Comments)    Bladder pain  . Levaquin [Levofloxacin In D5w] Swelling  . Lisinopril Swelling    Angioedema  . Metformin And Related Nausea Only  . Onglyza [Saxagliptin] Other (See Comments)    Increased PVC's  . Tramadol Nausea And Vomiting     ROS  Constitutional: Negative for fever, positive for  weight change.  Respiratory: Negative for cough and shortness of breath.   Cardiovascular: Negative for chest pain or palpitations.  Gastrointestinal: Negative for abdominal pain, no  bowel changes.  Musculoskeletal: Negative for gait problem or joint swelling.  Skin: Negative for rash.  Neurological: Negative for dizziness , positive for intermittent  headache.  No other specific complaints in a complete review of systems (except as listed in HPI above).  Objective  Vitals:   01/31/17 0815  BP: 122/78  Pulse: 84  Resp: 16  Temp: 98.5 F (36.9 C)  SpO2: 94%  Weight: 207 lb 8 oz (94.1 kg)  Height: '5\' 6"'  (1.676 m)    Body mass index is 33.49 kg/m.  Physical Exam  Constitutional: Patient appears well-developed and well-nourished. Obese  No distress.  HEENT:  head atraumatic, normocephalic, pupils equal and reactive to light, ears normal TM,  neck supple, throat within normal limits Cardiovascular: Normal rate, regular rhythm and normal heart sounds.  No murmur heard. No BLE edema. Pulmonary/Chest: Effort normal and breath sounds normal. No respiratory distress. Abdominal: Soft.  There is no tenderness. Psychiatric: Patient has a normal mood and affect. behavior is normal. Judgment and thought content normal.  PHQ2/9: Depression screen [facility] 2/9 10/28/2016 09/03/2016 06/02/2016 04/30/2016 11/04/2015  Decreased Interest 0 0 0 0 0  Down, Depressed, Hopeless 0 0 0 0 0  PHQ - 2 Score 0 0 0 0 0     Fall Risk: Fall Risk  10/28/2016 09/03/2016 06/02/2016 04/30/2016 11/04/2015  Falls in the past year? No No No No No      Assessment & Plan  1. Migraine without aura and without status migrainosus, not intractable  - SUMAtriptan (IMITREX) 50 MG tablet; Take 1 tablet (50 mg total) by mouth every 2 (two) hours as needed for migraine. May repeat in 2 hours if headache persists or recurs.  Dispense: 10 tablet; Refill: 0  2. Left-sided tinnitus  She recently went back to ENT but did not have MRI scheduled yet, explained the importance to have it done since she has left hemicrania and tinnitus left side   3. Essential hypertension  - triamterene-hydrochlorothiazide  (MAXZIDE-25) 37.5-25 MG tablet; Take 1 tablet by mouth daily.  Dispense: 90 tablet; Refill: 1 - nebivolol (BYSTOLIC) 10 MG tablet; Take 1 tablet (10 mg total) by mouth daily.  Dispense: 90 tablet; Refill: 1  4. GAD (generalized anxiety disorder)  - LORazepam (ATIVAN) 0.5 MG tablet; Take 1 tablet (0.5 mg total) by mouth daily as needed for anxiety.  Dispense: 30 tablet; Refill: 0  5. Pure hypercholesterolemia  Labs done by Endocrinologist last month and HDL and LDL were at goal   6. Type 2 diabetes mellitus with hyperglycemia, with long-term current use of insulin ([facility])  - liraglutide (VICTOZA) 18 MG/3ML SOPN; Inject 0.3 mLs (1.8 mg total) into the skin daily.  Dispense: 9 mL; Refill: 0

## 2017-02-08 ENCOUNTER — Encounter: Payer: Self-pay | Admitting: General Surgery

## 2017-02-08 ENCOUNTER — Ambulatory Visit (INDEPENDENT_AMBULATORY_CARE_PROVIDER_SITE_OTHER): Payer: PPO | Admitting: General Surgery

## 2017-02-08 VITALS — BP 128/80 | HR 82 | Resp 12 | Ht 67.0 in | Wt 210.0 lb

## 2017-02-08 DIAGNOSIS — Z853 Personal history of malignant neoplasm of breast: Secondary | ICD-10-CM

## 2017-02-08 NOTE — Patient Instructions (Addendum)
The patient has been asked to return to the office in one year with a bilateral screening mammogram. 

## 2017-02-08 NOTE — Progress Notes (Signed)
Patient ID: Morgan Peterson, female   DOB: 1951-12-12, 66 y.o.   MRN: [id number]  Chief Complaint  Patient presents with  . Follow-up    HPI Morgan Peterson is a 66 y.o. female who presents for a breast evaluation. The most recent mammogram was done on 01/20/2017.  Patient does perform regular self breast checks and gets regular mammograms done.    HPI  Past Medical History:  Diagnosis Date  . Cancer [facility]) 2010   Right Breast  . Diabetes mellitus without complication ([place]) 8250  . Hypertension   . IBS (irritable bowel syndrome)   . 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 Arimidex therapy completed September 2015  . Personal history of malignant neoplasm of breast 2010    Past Surgical History:  Procedure Laterality Date  . ABDOMINAL EXPLORATION SURGERY  2000  . BREAST BIOPSY Right 2010   +   . BREAST EXCISIONAL BIOPSY Right 2010   + mammo site inasive mammo ca  . BREAST LUMPECTOMY Right 2010  . COLONOSCOPY  2003, 2013   Dr Alveta Heimlich, Dr. Bary Castilla  . TUBAL LIGATION  20 years ago    Family History  Problem Relation Age of Onset  . Hypertension Mother   . Hyperlipidemia Mother   . Diabetes Father   . Kidney disease Father   . Diabetes Brother   . Colon cancer Maternal Uncle   . Breast cancer Maternal Aunt     Social History Social History  Substance Use Topics  . Smoking status: Former Smoker    Packs/day: 0.25    Years: 10.00    Types: Cigarettes  . Smokeless tobacco: Never Used  . Alcohol use 0.0 oz/week    Allergies  Allergen Reactions  . Actos [Pioglitazone] Other (See Comments)    Bladder pain  . Amlodipine-Olmesartan   . Codeine   . Gabapentin     cns side effects.  . Glipizide     Other reaction(s): Abdominal pain  . Invokana [Canagliflozin] Other (See Comments)    Bladder  pain  . Levaquin [Levofloxacin In D5w] Swelling  . Lisinopril Swelling    Angioedema  . Metformin And Related Nausea Only  . Onglyza [Saxagliptin] Other (See Comments)    Increased PVC's  . Tramadol Nausea And Vomiting    Current Outpatient Prescriptions  Medication Sig Dispense Refill  . Blood Glucose Monitoring Suppl (ONETOUCH VERIO) w/Device KIT 1 Device by Does not apply route once. 1 kit 0  . calcium carbonate 200 MG capsule Take 250 mg by mouth 2 (two) times daily with a meal.    . cholecalciferol (VITAMIN D) 1000 UNITS tablet Take 1,000 Units by mouth daily.    . fluticasone (FLONASE) 50 MCG/ACT nasal spray PLACE 2 SPRAYS INTO BOTH NOSTRILS DAILY. 16 g 2  . glucose blood (ONETOUCH VERIO) test strip Use as instructed 100 each 12  . insulin glargine (LANTUS) 100 UNIT/ML injection Inject 20 Units into the skin at bedtime.     . Lancets (ONETOUCH ULTRASOFT) lancets Use as instructed 100 each 3  . liraglutide (VICTOZA) 18 MG/3ML SOPN Inject 0.3 mLs (1.8 mg total) into the skin daily. 9 mL 0  . LORazepam (ATIVAN) 0.5 MG tablet Take 1 tablet (0.5 mg total) by mouth daily as needed for anxiety. 30 tablet 0  . nebivolol (  BYSTOLIC) 10 MG tablet Take 1 tablet (10 mg total) by mouth daily. 90 tablet 1  . SUMAtriptan (IMITREX) 50 MG tablet Take 1 tablet (50 mg total) by mouth every 2 (two) hours as needed for migraine. May repeat in 2 hours if headache persists or recurs. 10 tablet 0  . triamterene-hydrochlorothiazide (MAXZIDE-25) 37.5-25 MG tablet Take 1 tablet by mouth daily. 90 tablet 1   No current facility-administered medications for this visit.     Review of Systems Review of Systems  Constitutional: Negative.   Respiratory: Negative.   Cardiovascular: Negative.     Blood pressure 128/80, pulse 82, resp. rate 12, height '5\' 7"'  (1.702 m), weight 210 lb (95.3 kg).  Physical Exam Physical Exam  Constitutional: She is oriented to person, place, and time. She appears well-developed  and well-nourished.  Eyes: Conjunctivae are normal. No scleral icterus.  Neck: Neck supple.  Cardiovascular: Normal rate, regular rhythm and normal heart sounds.   Pulmonary/Chest: Effort normal and breath sounds normal. Right breast exhibits no inverted nipple, no mass, no nipple discharge, no skin change and no tenderness. Left breast exhibits no inverted nipple, no mass, no nipple discharge, no skin change and no tenderness.    Right breast thickening in between the two scars.   Lymphadenopathy:    She has no cervical adenopathy.    She has no axillary adenopathy.  Neurological: She is alert and oriented to person, place, and time.  Skin: Skin is warm and dry.    Data Reviewed Diagnostic mammogram dated 01/20/2017 reviewed. Stable scarring in the area of surgical/radiation therapy. BI-RADS-2. Screening mammogram recommended for follow-up.  Assessment    Stable breast exam now nearly 8 years after conservative management of a right breast cancer.  The patient has been released from follow-up with medical oncology.     Plan        The patient has been asked to return to the office in one year with a bilateral screening  mammogram.  This information has been scribed by Gaspar Cola CMA.   Robert Bellow 02/08/2017, 12:32 PM

## 2017-03-04 ENCOUNTER — Ambulatory Visit: Payer: 59 | Admitting: Family Medicine

## 2017-03-16 DIAGNOSIS — Z794 Long term (current) use of insulin: Secondary | ICD-10-CM | POA: Diagnosis not present

## 2017-03-16 DIAGNOSIS — E041 Nontoxic single thyroid nodule: Secondary | ICD-10-CM | POA: Diagnosis not present

## 2017-03-16 DIAGNOSIS — E1165 Type 2 diabetes mellitus with hyperglycemia: Secondary | ICD-10-CM | POA: Diagnosis not present

## 2017-03-16 DIAGNOSIS — R102 Pelvic and perineal pain: Secondary | ICD-10-CM | POA: Diagnosis not present

## 2017-03-16 LAB — CBC AND DIFFERENTIAL: HEMOGLOBIN: 6.1 g/dL — AB (ref 12.0–16.0)

## 2017-03-16 LAB — BASIC METABOLIC PANEL: Glucose: 120 mg/dL

## 2017-03-16 LAB — HEMOGLOBIN A1C: Hemoglobin A1C: 6.1

## 2017-03-19 ENCOUNTER — Other Ambulatory Visit: Payer: Self-pay | Admitting: Family Medicine

## 2017-03-23 ENCOUNTER — Ambulatory Visit (INDEPENDENT_AMBULATORY_CARE_PROVIDER_SITE_OTHER): Payer: PPO | Admitting: Family Medicine

## 2017-03-23 ENCOUNTER — Ambulatory Visit
Admission: RE | Admit: 2017-03-23 | Discharge: 2017-03-23 | Disposition: A | Discharge status: Home / Self Care | Payer: PPO | Source: Ambulatory Visit | Attending: Family Medicine | Admitting: Family Medicine

## 2017-03-23 ENCOUNTER — Encounter: Payer: Self-pay | Admitting: Family Medicine

## 2017-03-23 VITALS — BP 124/78 | HR 97 | Temp 99.8°F | Resp 16 | Wt 201.0 lb

## 2017-03-23 DIAGNOSIS — R05 Cough: Secondary | ICD-10-CM | POA: Diagnosis not present

## 2017-03-23 DIAGNOSIS — J029 Acute pharyngitis, unspecified: Secondary | ICD-10-CM | POA: Diagnosis not present

## 2017-03-23 DIAGNOSIS — J9811 Atelectasis: Secondary | ICD-10-CM | POA: Insufficient documentation

## 2017-03-23 DIAGNOSIS — J101 Influenza due to other identified influenza virus with other respiratory manifestations: Secondary | ICD-10-CM | POA: Diagnosis not present

## 2017-03-23 DIAGNOSIS — R059 Cough, unspecified: Secondary | ICD-10-CM

## 2017-03-23 DIAGNOSIS — R509 Fever, unspecified: Secondary | ICD-10-CM | POA: Diagnosis not present

## 2017-03-23 DIAGNOSIS — K449 Diaphragmatic hernia without obstruction or gangrene: Secondary | ICD-10-CM | POA: Diagnosis not present

## 2017-03-23 LAB — POCT INFLUENZA A/B
INFLUENZA B, POC: POSITIVE — AB
Influenza A, POC: NEGATIVE

## 2017-03-23 LAB — POCT RAPID STREP A (OFFICE): Rapid Strep A Screen: NEGATIVE

## 2017-03-23 MED ORDER — PROMETHAZINE-DM 6.25-15 MG/5ML PO SYRP: 5.0000 mL | ORAL_SOLUTION | Freq: Four times a day (QID) | ORAL | 0 refills | Status: DC | PRN

## 2017-03-23 MED ORDER — OSELTAMIVIR PHOSPHATE 75 MG PO CAPS: 75.0000 mg | ORAL_CAPSULE | Freq: Two times a day (BID) | ORAL | 0 refills | Status: AC

## 2017-03-23 MED ORDER — AZITHROMYCIN 250 MG PO TABS: ORAL_TABLET | ORAL | 0 refills | Status: DC

## 2017-03-23 NOTE — Patient Instructions (Addendum)
Do stay out of work today and tomorrow and all weekend until you are really feeling better Start the antibiotics and the Tamiflu Get the chest xray across the street please Use the cough medicine if needed Please do eat yogurt daily or take a probiotic daily for the next month We want to replace the healthy germs in the gut If you notice foul, watery diarrhea in the next two months, schedule an appointment RIGHT AWAY We are always available on-call if needed

## 2017-03-23 NOTE — Progress Notes (Signed)
BP 124/78   Pulse 97   Temp 99.8 F (37.7 C) (Oral)   Resp 16   Wt 201 lb (91.2 kg)   SpO2 94%   BMI 31.48 kg/m    Subjective:    Patient ID: Morgan Peterson, female    DOB: 04-19-1951, 66 y.o.   MRN: [id number]  HPI: Morgan Peterson is a 66 y.o. female  Chief Complaint  Patient presents with  . Influenza    FLU like symptoms:  bad cough, fever ( 100.0 highest fever), body aches, chills, headaches and sore throat. Started sunday   Patient is here for an acute visit; sick on Sunday with headache and cough She has coughed so much she has about lost her breath Lays down after eating; just so tired [place] stuff in her lungs and wheezing in the night She has Proair inhaler at home, plenty left in the container (added to med list) Feels weak; just keep pushing herself Running low grade 100 degree fevers No rash No travel She has tried tylenol Had flu shot Works at a group home She does not take metformin; has diabetes No hx of pneumonia  Depression screen [facility] 2/9 03/23/2017 10/28/2016 09/03/2016 06/02/2016 04/30/2016  Decreased Interest 0 0 0 0 0  Down, Depressed, Hopeless 0 0 0 0 0  PHQ - 2 Score 0 0 0 0 0    Relevant past medical, surgical, family and social history reviewed Past Medical History:  Diagnosis Date  . Cancer [facility]) 2010   Right Breast  . Diabetes mellitus without complication ([place]) 3500  . Hypertension   . IBS (irritable bowel syndrome)   . 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 Arimidex therapy completed September 2015  . Personal history of malignant neoplasm of breast 2010   Past Surgical History:  Procedure Laterality Date  . ABDOMINAL EXPLORATION SURGERY  2000  . BREAST BIOPSY Right 2010   +   . BREAST EXCISIONAL BIOPSY Right 2010   + mammo site inasive mammo ca  .  BREAST LUMPECTOMY Right 2010  . COLONOSCOPY  2003, 2013   Dr Alveta Heimlich, Dr. Bary Castilla  . TUBAL LIGATION  20 years ago   Social History  Substance Use Topics  . Smoking status: Former Smoker    Packs/day: 0.25    Years: 10.00    Types: Cigarettes  . Smokeless tobacco: Never Used  . Alcohol use 0.0 oz/week    Interim medical history since last visit reviewed. Allergies and medications reviewed  Review of Systems Per HPI unless specifically indicated above     Objective:    BP 124/78   Pulse 97   Temp 99.8 F (37.7 C) (Oral)   Resp 16   Wt 201 lb (91.2 kg)   SpO2 94%   BMI 31.48 kg/m   Wt Readings from Last 3 Encounters:  03/23/17 201 lb (91.2 kg)  02/08/17 210 lb (95.3 kg)  01/31/17 207 lb 8 oz (94.1 kg)    Physical Exam  Constitutional: She appears well-developed and well-nourished. No distress.  HENT:  Head: Normocephalic and atraumatic.  Eyes: EOM are normal. Right eye exhibits no discharge. Left eye exhibits no discharge. No scleral icterus.  Cardiovascular: Normal rate, regular rhythm and normal heart sounds.   No murmur heard. Pulmonary/Chest: Effort normal. No respiratory distress.  She has no decreased breath sounds. She has wheezes. She has rhonchi (scattered rhonchi through both L and R lung fields).  Abdominal: She exhibits no distension.  Musculoskeletal: She exhibits no edema.  Lymphadenopathy:    She has no cervical adenopathy.  Neurological: She is alert.  Skin: Skin is warm and dry. No rash noted. She is not diaphoretic. No pallor.  Psychiatric: She has a normal mood and affect.   Results for orders placed or performed in visit on 03/23/17  POCT rapid strep A  Result Value Ref Range   Rapid Strep A Screen Negative Negative  POCT Influenza A/B  Result Value Ref Range   Influenza A, POC Negative Negative   Influenza B, POC Positive (A) Negative      Assessment & Plan:   Problem List Items Addressed This Visit    None    Visit Diagnoses     Influenza B    -  Primary   explained dx; rest, hydration; start Tamiflu (3 days since onset, but respiratory sx); CXR; I am on-call, contact if needed or go to ER if worse   Relevant Medications   azithromycin (ZITHROMAX) 250 MG tablet   oseltamivir (TAMIFLU) 75 MG capsule   Sore throat       staff ordered rapid strep (negative); culture is pending   Relevant Orders   POCT rapid strep A (Completed)   Culture, Group A Strep   Chills with fever       secondary to flu B   Relevant Orders   POCT Influenza A/B (Completed)   Cough       will get CXR; she is allergic to levaquin and doxy; start azithromycin; to ER if worse   Relevant Orders   DG Chest 2 View       Follow up plan: No Follow-up on file.  An after-visit summary was printed and given to the patient at [place].  Please see the patient instructions which may contain other information and recommendations beyond what is mentioned above in the assessment and plan.  Meds ordered this encounter  Medications  . lovastatin (MEVACOR) 40 MG tablet    Sig: Take 40 mg by mouth at bedtime.  Marland Kitchen albuterol (PROAIR HFA) 108 (90 Base) MCG/ACT inhaler    Sig: Inhale 2 puffs into the lungs every 4 (four) hours as needed for wheezing or shortness of breath.  Marland Kitchen azithromycin (ZITHROMAX) 250 MG tablet    Sig: Two pills by mouth today, then one pill daily for four more days    Dispense:  6 tablet    Refill:  0  . promethazine-dextromethorphan (PROMETHAZINE-DM) 6.25-15 MG/5ML syrup    Sig: Take 5 mLs by mouth 4 (four) times daily as needed for cough.    Dispense:  180 mL    Refill:  0  . oseltamivir (TAMIFLU) 75 MG capsule    Sig: Take 1 capsule (75 mg total) by mouth 2 (two) times daily.    Dispense:  10 capsule    Refill:  0    Orders Placed This Encounter  Procedures  . Culture, Group A Strep  . DG Chest 2 View  . POCT rapid strep A  . POCT Influenza A/B

## 2017-03-25 LAB — CULTURE, GROUP A STREP

## 2017-05-02 ENCOUNTER — Ambulatory Visit (INDEPENDENT_AMBULATORY_CARE_PROVIDER_SITE_OTHER): Payer: PPO | Admitting: Family Medicine

## 2017-05-02 ENCOUNTER — Other Ambulatory Visit: Payer: Self-pay | Admitting: Family Medicine

## 2017-05-02 ENCOUNTER — Encounter: Payer: Self-pay | Admitting: Family Medicine

## 2017-05-02 VITALS — BP 118/64 | HR 66 | Temp 98.0°F | Resp 16 | Ht 67.0 in | Wt 206.0 lb

## 2017-05-02 DIAGNOSIS — E1169 Type 2 diabetes mellitus with other specified complication: Secondary | ICD-10-CM

## 2017-05-02 DIAGNOSIS — E7800 Pure hypercholesterolemia, unspecified: Secondary | ICD-10-CM

## 2017-05-02 DIAGNOSIS — E669 Obesity, unspecified: Secondary | ICD-10-CM

## 2017-05-02 DIAGNOSIS — I10 Essential (primary) hypertension: Secondary | ICD-10-CM

## 2017-05-02 DIAGNOSIS — J302 Other seasonal allergic rhinitis: Secondary | ICD-10-CM

## 2017-05-02 DIAGNOSIS — G43009 Migraine without aura, not intractable, without status migrainosus: Secondary | ICD-10-CM

## 2017-05-02 DIAGNOSIS — R9431 Abnormal electrocardiogram [ECG] [EKG]: Secondary | ICD-10-CM | POA: Diagnosis not present

## 2017-05-02 DIAGNOSIS — K581 Irritable bowel syndrome with constipation: Secondary | ICD-10-CM

## 2017-05-02 MED ORDER — FLUTICASONE PROPIONATE 50 MCG/ACT NA SUSP: 2.0000 | Freq: Every day | NASAL | 2 refills | Status: DC

## 2017-05-02 MED ORDER — POLYETHYLENE GLYCOL 3350 17 GM/SCOOP PO POWD: 17.0000 g | Freq: Two times a day (BID) | ORAL | 1 refills | Status: DC | PRN

## 2017-05-02 MED ORDER — TRIAMTERENE-HCTZ 37.5-25 MG PO TABS: 1.0000 | ORAL_TABLET | Freq: Every day | ORAL | 1 refills | Status: DC

## 2017-05-02 MED ORDER — CARVEDILOL 6.25 MG PO TABS: 6.2500 mg | ORAL_TABLET | Freq: Two times a day (BID) | ORAL | 1 refills | Status: DC

## 2017-05-02 NOTE — Patient Instructions (Signed)
  Check FODMAP diet on website  Diet for Irritable Bowel Syndrome   When you have irritable bowel syndrome (IBS), the foods you eat and your eating habits are very important. IBS may cause various symptoms, such as abdominal pain, constipation, or diarrhea. Choosing the right foods can help ease discomfort caused by these symptoms. Work with your health care provider and dietitian to find the best eating plan to help control your symptoms. What general guidelines do I need to follow?  Keep a food diary. This will help you identify foods that cause symptoms. Write down:  What you eat and when.  What symptoms you have.  When symptoms occur in relation to your meals.  Avoid foods that cause symptoms. Talk with your dietitian about other ways to get the same nutrients that are in these foods.  Eat more foods that contain fiber. Take a fiber supplement if directed by your dietitian.  Eat your meals slowly, in a relaxed setting.  Aim to eat 5-6 small meals per day. Do not skip meals.  Drink enough fluids to keep your urine clear or pale yellow.  Ask your health care provider if you should take an over-the-counter probiotic during flare-ups to help restore healthy gut bacteria.  If you have cramping or diarrhea, try making your meals low in fat and high in carbohydrates. Examples of carbohydrates are pasta, rice, whole grain breads and cereals, fruits, and vegetables.  If dairy products cause your symptoms to flare up, try eating less of them. You might be able to handle yogurt better than other dairy products because it contains bacteria that help with digestion. What foods are not recommended? The following are some foods and drinks that may worsen your symptoms:  Fatty foods, such as [place] fries.  Milk products, such as cheese or ice cream.  Chocolate.  Alcohol.  Products with caffeine, such as coffee.  Carbonated drinks, such as soda. The items listed above may not be a  complete list of foods and beverages to avoid. Contact your dietitian for more information.  What foods are good sources of fiber? Your health care provider or dietitian may recommend that you eat more foods that contain fiber. Fiber can help reduce constipation and other IBS symptoms. Add foods with fiber to your diet a little at a time so that your body can get used to them. Too much fiber at once might cause gas and swelling of your abdomen. The following are some foods that are good sources of fiber:  Apples.  Peaches.  Pears.  Berries.  Figs.  Broccoli (raw).  Cabbage.  Carrots.  Raw peas.  Kidney beans.  Lima beans.  Whole grain bread.  Whole grain cereal. Where to find more information: [employer] for Functional Gastrointestinal Disorders: www.iffgd.[employer] of Diabetes and Digestive and Kidney Diseases: NetworkAffair.co.za.aspx This information is not intended to replace advice given to you by your health care provider. Make sure you discuss any questions you have with your health care provider. Document Released: 02/26/2004 Document Revised: 05/13/2016 Document Reviewed: 03/08/2014 Elsevier Interactive Patient Education  2017 [employer].

## 2017-05-02 NOTE — Progress Notes (Signed)
Name: Morgan Peterson   MRN: [id number]    DOB: July 19, 1951   Date:05/02/2017       Progress Note  Subjective  Chief Complaint  Chief Complaint  Patient presents with  . Irritable Bowel Syndrome    flare up would like something OTC for abdominal discomfort  . Migraine    needs new order for MRI     HPI    Migraine headaches: she is not taking Topamax because she likes drinking red wine occasionally. Migraine is  usually on the left side of head, it starts as a pressure on left temporal area and radiates to nuchal area, scalp is sensitive to touch on left occipital area, she has associated photophobia. At times she has nausea, but no vomiting. She denies weakness. Symptoms improves with Imitrex and rest within one hour. She called after her last visit because of one episode of severe headache and we ordered an MRI, but ordered expired, she states last episode was one month ago and mild, she just rested and symptoms resolved. Explained that since symptoms improved no need to re-order MRI.   GAD: she is not on SSRI, she takes BZD very seldom, 30 pills lasts over 3 months and she does not need a refill at this time  IBS constipation type: she has a long history of IBS constipation, she states she was doing well, but had an episode of gastroenteritis followed by severe constipation, she had to disimpact herself with a gloved finger, she has taken a couple of doses of Miralax since. She states that lower abdomen is still feeling sore. She states her bowel movements are a couple of times a week, last bowel movements 3 days ago, and Bristol scale is between Type 1 to Type 4 - usually type 4 when she takes medication. Discussed Amitiza/Linzess but she has reached the doughnut hole and can't afford expensive medications at this time  HTN: bp is at goal today, she has been taking medication as prescribed, denies chest pain or palpitation, she states Bystolic is too expensive, we will try switching to  Coreg  DM: seeing Endocrinologist. Dr. Elsie Stain and is now on Victoza and Lantus. Last hgbA1C was 6.1% and negative urine micro 02/2017. No polyphagia, no polydipsia or polyuria. Reviewed records from [place].   Patient Active Problem List   Diagnosis Date Noted  . Irritable bowel syndrome with constipation 05/02/2017  . Vitamin D deficiency 09/09/2016  . Hiatal hernia 06/02/2016  . Allergic rhinitis, seasonal 06/02/2016  . Adult BMI 30+ 04/30/2016  . Angio-edema 04/30/2016  . GAD (generalized anxiety disorder) 11/04/2015  . Hyperlipidemia 11/04/2015  . Cervical radiculopathy at C6 07/31/2015  . Neuroma digital nerve 07/31/2015  . Type 2 diabetes mellitus with hyperglycemia, with long-term current use of insulin ([place]) 06/30/2015  . Right shoulder pain 06/30/2015  . Cervical disc disease 06/30/2015  . Essential hypertension 06/30/2015  . History of breast cancer in female 01/07/2014    Past Surgical History:  Procedure Laterality Date  . ABDOMINAL EXPLORATION SURGERY  2000  . BREAST BIOPSY Right 2010   +   . BREAST EXCISIONAL BIOPSY Right 2010   + mammo site inasive mammo ca  . BREAST LUMPECTOMY Right 2010  . COLONOSCOPY  2003, 2013   Dr Alveta Heimlich, Dr. Bary Castilla  . TUBAL LIGATION  20 years ago    Family History  Problem Relation Age of Onset  . Hypertension Mother   . Hyperlipidemia Mother   . Diabetes Father   .  Kidney disease Father   . Diabetes Brother   . Colon cancer Maternal Uncle   . Breast cancer Maternal Aunt     Social History   Social History  . Marital status: Married    Spouse name: N/A  . Number of children: N/A  . Years of education: N/A   Occupational History  . Not on file.   Social History Main Topics  . Smoking status: Former Smoker    Packs/day: 0.25    Years: 10.00    Types: Cigarettes  . Smokeless tobacco: Never Used  . Alcohol use 0.0 oz/week  . Drug use: No  . Sexual activity: Not Currently     Comment: husband has ED   Other Topics  Concern  . Not on file   Social History Narrative  . No narrative on file     Current Outpatient Prescriptions:  .  Blood Glucose Monitoring Suppl (ONETOUCH VERIO) w/Device KIT, 1 Device by Does not apply route once., Disp: 1 kit, Rfl: 0 .  calcium carbonate 200 MG capsule, Take 200 mg by mouth 2 (two) times daily with a meal. , Disp: , Rfl:  .  carvedilol (COREG) 6.25 MG tablet, Take 1 tablet (6.25 mg total) by mouth 2 (two) times daily with a meal., Disp: 180 tablet, Rfl: 1 .  cholecalciferol (VITAMIN D) 1000 UNITS tablet, Take 1,000 Units by mouth daily., Disp: , Rfl:  .  fluticasone (FLONASE) 50 MCG/ACT nasal spray, Place 2 sprays into both nostrils daily., Disp: 16 g, Rfl: 2 .  glucose blood (ONETOUCH VERIO) test strip, Use as instructed, Disp: 100 each, Rfl: 12 .  insulin glargine (LANTUS) 100 UNIT/ML injection, Inject 20 Units into the skin daily before breakfast. , Disp: , Rfl:  .  Lancets (ONETOUCH ULTRASOFT) lancets, Use as instructed, Disp: 100 each, Rfl: 3 .  liraglutide (VICTOZA) 18 MG/3ML SOPN, Inject 0.3 mLs (1.8 mg total) into the skin daily., Disp: 9 mL, Rfl: 0 .  LORazepam (ATIVAN) 0.5 MG tablet, Take 1 tablet (0.5 mg total) by mouth daily as needed for anxiety., Disp: 30 tablet, Rfl: 0 .  lovastatin (MEVACOR) 40 MG tablet, Take 40 mg by mouth at bedtime., Disp: , Rfl:  .  polyethylene glycol powder (GLYCOLAX/MIRALAX) powder, Take 17 g by mouth 2 (two) times daily as needed., Disp: 3350 g, Rfl: 1 .  SUMAtriptan (IMITREX) 50 MG tablet, Take 1 tablet (50 mg total) by mouth every 2 (two) hours as needed for migraine. May repeat in 2 hours if headache persists or recurs., Disp: 10 tablet, Rfl: 0 .  triamterene-hydrochlorothiazide (MAXZIDE-25) 37.5-25 MG tablet, Take 1 tablet by mouth daily., Disp: 90 tablet, Rfl: 1  Allergies  Allergen Reactions  . Amlodipine-Olmesartan   . Codeine   . Gabapentin     cns side effects.  . Glipizide     Other reaction(s): Abdominal pain  .  Invokana [Canagliflozin] Other (See Comments)    Bladder pain  . Levaquin [Levofloxacin In D5w] Swelling  . Lisinopril Swelling    Angioedema  . Metformin And Related Nausea Only  . Onglyza [Saxagliptin] Other (See Comments)    Increased PVC's  . Tramadol Nausea And Vomiting  . Actos [Pioglitazone] Other (See Comments) and Nausea And Vomiting    Bladder pain  . Prednisone Palpitations     ROS  Constitutional: Negative for fever or weight change.  Respiratory: Negative for cough and shortness of breath.   Cardiovascular: Negative for chest pain or palpitations.  Gastrointestinal:  Positive  for abdominal pain, no bowel changes.  Musculoskeletal: Negative for gait problem or joint swelling.  Skin: Negative for rash.  Neurological: Negative for dizziness or headache.  No other specific complaints in a complete review of systems (except as listed in HPI above).  Objective  Vitals:   05/02/17 0909  BP: 118/64  Pulse: 66  Resp: 16  Temp: 98 F (36.7 C)  SpO2: 98%  Weight: 206 lb (93.4 kg)  Height: _0  (1.702 m)    Body mass index is 32.26 kg/m.  Physical Exam  Constitutional: Patient appears well-developed and well-nourished. Obese  No distress.  HEENT: head atraumatic, normocephalic, pupils equal and reactive to light,  neck supple, throat within normal limits Cardiovascular: Normal rate, regular rhythm and normal heart sounds.  No murmur heard. No BLE edema. Pulmonary/Chest: Effort normal and breath sounds normal. No respiratory distress. Abdominal: Soft.  There is no tenderness. Psychiatric: Patient has a normal mood and affect. behavior is normal. Judgment and thought content normal.  Recent Results (from the past 2160 hour(s))  CBC and differential     Status: Abnormal   Collection Time: 03/16/17 12:00 AM  Result Value Ref Range   Hemoglobin 6.1 (A) 12.0 - 16.0 g/dL  Basic metabolic panel     Status: None   Collection Time: 03/16/17 12:00 AM  Result Value  Ref Range   Glucose 120 mg/dL  Hemoglobin A1c     Status: None   Collection Time: 03/16/17 12:00 AM  Result Value Ref Range   Hemoglobin A1C 6.1   Culture, Group A Strep     Status: None   Collection Time: 03/23/17 11:26 AM  Result Value Ref Range   Organism ID, Bacteria NO GROUP A STREP (S. PYOGENES) ISOLATED   POCT rapid strep A     Status: Normal   Collection Time: 03/23/17 11:33 AM  Result Value Ref Range   Rapid Strep A Screen Negative Negative  POCT Influenza A/B     Status: Abnormal   Collection Time: 03/23/17 11:34 AM  Result Value Ref Range   Influenza A, POC Negative Negative   Influenza B, POC Positive (A) Negative      PHQ2/9: Depression screen [facility] 2/9 03/23/2017 10/28/2016 09/03/2016 06/02/2016 04/30/2016  Decreased Interest 0 0 0 0 0  Down, Depressed, Hopeless 0 0 0 0 0  PHQ - 2 Score 0 0 0 0 0    Fall Risk: Fall Risk  03/23/2017 10/28/2016 09/03/2016 06/02/2016 04/30/2016  Falls in the past year? _1      Assessment & Plan  1. Migraine without aura and without status migrainosus, not intractable   2. Essential hypertension  - triamterene-hydrochlorothiazide (MAXZIDE-25) 37.5-25 MG tablet; Take 1 tablet by mouth daily.  Dispense: 90 tablet; Refill: 1 - carvedilol (COREG) 6.25 MG tablet; Take 1 tablet (6.25 mg total) by mouth 2 (two) times daily with a meal.  Dispense: 180 tablet; Refill: 1 - EKG 12-Lead  3. Pure hypercholesterolemia  - EKG 12-Lead  4. Diabetes mellitus type 2 in obese ([place])  Diabetes is under control and she will try to get Victoza through drug company, out of medication for the past week  5. Seasonal allergic rhinitis, unspecified trigger  - fluticasone (FLONASE) 50 MCG/ACT nasal spray; Place 2 sprays into both nostrils daily.  Dispense: 16 g; Refill: 2  6. Irritable bowel syndrome with constipation  - polyethylene glycol powder (GLYCOLAX/MIRALAX) powder; Take 17 g by mouth 2 (two) times daily  as needed.  Dispense: 3350 g;  Refill: 1

## 2017-05-02 NOTE — Addendum Note (Signed)
Addended by: Inda Coke on: 05/02/2017 09:57 AM   Modules accepted: Orders

## 2017-05-03 ENCOUNTER — Telehealth: Payer: Self-pay | Admitting: Family Medicine

## 2017-05-03 NOTE — Telephone Encounter (Signed)
Pt forgot to remind you to send a discontinued order to [facility] for her to stop taking bystolic

## 2017-05-03 NOTE — Telephone Encounter (Signed)
Called and left message with [facility] to please D/C Bystolic

## 2017-05-06 DIAGNOSIS — R002 Palpitations: Secondary | ICD-10-CM | POA: Diagnosis not present

## 2017-05-06 DIAGNOSIS — E782 Mixed hyperlipidemia: Secondary | ICD-10-CM | POA: Diagnosis not present

## 2017-05-06 DIAGNOSIS — R0602 Shortness of breath: Secondary | ICD-10-CM | POA: Diagnosis not present

## 2017-05-06 DIAGNOSIS — M545 Low back pain: Secondary | ICD-10-CM | POA: Diagnosis not present

## 2017-05-06 DIAGNOSIS — G8929 Other chronic pain: Secondary | ICD-10-CM | POA: Diagnosis not present

## 2017-05-06 DIAGNOSIS — E119 Type 2 diabetes mellitus without complications: Secondary | ICD-10-CM | POA: Diagnosis not present

## 2017-05-06 DIAGNOSIS — R9431 Abnormal electrocardiogram [ECG] [EKG]: Secondary | ICD-10-CM | POA: Diagnosis not present

## 2017-05-06 DIAGNOSIS — E669 Obesity, unspecified: Secondary | ICD-10-CM | POA: Diagnosis not present

## 2017-05-06 DIAGNOSIS — I10 Essential (primary) hypertension: Secondary | ICD-10-CM | POA: Diagnosis not present

## 2017-05-06 DIAGNOSIS — I208 Other forms of angina pectoris: Secondary | ICD-10-CM | POA: Diagnosis not present

## 2017-05-13 ENCOUNTER — Telehealth: Payer: Self-pay | Admitting: Family Medicine

## 2017-05-13 MED ORDER — ATENOLOL 25 MG PO TABS: 25.0000 mg | ORAL_TABLET | Freq: Every day | ORAL | 0 refills | Status: DC

## 2017-05-13 NOTE — Telephone Encounter (Signed)
I returned her call Tingling in both arms and fingers, aches in muscles, tingling in lips and tongue Going on for over a week Was on bystolic Try atenolol 25 mg daily in place No asthma, no atrial fib, going for stress test  BP under control w/Bystolic Monitor BP and pulse and seek help if needed

## 2017-05-13 NOTE — Telephone Encounter (Signed)
Dr Ancil Boozer had prescribed Coreg and pt is not liking it. States her lips goes numb and tingling in her hand and arm. Please return call [phone]

## 2017-05-13 NOTE — Telephone Encounter (Signed)
Dr. Sanda Klein can you advise on this patient

## 2017-05-17 DIAGNOSIS — R0602 Shortness of breath: Secondary | ICD-10-CM | POA: Diagnosis not present

## 2017-05-17 DIAGNOSIS — I208 Other forms of angina pectoris: Secondary | ICD-10-CM | POA: Diagnosis not present

## 2017-05-17 DIAGNOSIS — R9431 Abnormal electrocardiogram [ECG] [EKG]: Secondary | ICD-10-CM | POA: Diagnosis not present

## 2017-05-20 ENCOUNTER — Telehealth: Payer: Self-pay | Admitting: Family Medicine

## 2017-05-20 MED ORDER — ATENOLOL 25 MG PO TABS: 25.0000 mg | ORAL_TABLET | Freq: Every day | ORAL | 1 refills | Status: DC

## 2017-05-20 NOTE — Telephone Encounter (Signed)
Thank you for letting me know. I am sending in a refill. Please schedule patient for a BP follow up in 3-5 weeks. Thank you!

## 2017-05-20 NOTE — Telephone Encounter (Signed)
Pt is really tolerating atenolol 25mg  and is asking that you please call in a full script to [facility]

## 2017-05-23 NOTE — Telephone Encounter (Signed)
appt made

## 2017-05-25 DIAGNOSIS — R0602 Shortness of breath: Secondary | ICD-10-CM | POA: Diagnosis not present

## 2017-05-25 DIAGNOSIS — E669 Obesity, unspecified: Secondary | ICD-10-CM | POA: Diagnosis not present

## 2017-05-25 DIAGNOSIS — E782 Mixed hyperlipidemia: Secondary | ICD-10-CM | POA: Diagnosis not present

## 2017-05-25 DIAGNOSIS — E119 Type 2 diabetes mellitus without complications: Secondary | ICD-10-CM | POA: Diagnosis not present

## 2017-05-25 DIAGNOSIS — I10 Essential (primary) hypertension: Secondary | ICD-10-CM | POA: Diagnosis not present

## 2017-05-25 DIAGNOSIS — I208 Other forms of angina pectoris: Secondary | ICD-10-CM | POA: Diagnosis not present

## 2017-05-25 DIAGNOSIS — R9431 Abnormal electrocardiogram [ECG] [EKG]: Secondary | ICD-10-CM | POA: Diagnosis not present

## 2017-05-25 DIAGNOSIS — G8929 Other chronic pain: Secondary | ICD-10-CM | POA: Diagnosis not present

## 2017-05-25 DIAGNOSIS — R002 Palpitations: Secondary | ICD-10-CM | POA: Diagnosis not present

## 2017-05-25 DIAGNOSIS — M545 Low back pain: Secondary | ICD-10-CM | POA: Diagnosis not present

## 2017-05-31 DIAGNOSIS — Z794 Long term (current) use of insulin: Secondary | ICD-10-CM | POA: Diagnosis not present

## 2017-05-31 DIAGNOSIS — E785 Hyperlipidemia, unspecified: Secondary | ICD-10-CM | POA: Diagnosis not present

## 2017-05-31 DIAGNOSIS — Z6835 Body mass index (BMI) 35.0-35.9, adult: Secondary | ICD-10-CM | POA: Diagnosis not present

## 2017-05-31 DIAGNOSIS — E041 Nontoxic single thyroid nodule: Secondary | ICD-10-CM | POA: Diagnosis not present

## 2017-05-31 DIAGNOSIS — E6609 Other obesity due to excess calories: Secondary | ICD-10-CM | POA: Diagnosis not present

## 2017-05-31 DIAGNOSIS — E1165 Type 2 diabetes mellitus with hyperglycemia: Secondary | ICD-10-CM | POA: Diagnosis not present

## 2017-06-12 ENCOUNTER — Other Ambulatory Visit: Payer: Self-pay | Admitting: Family Medicine

## 2017-06-14 ENCOUNTER — Ambulatory Visit: Payer: PPO | Admitting: Family Medicine

## 2017-06-17 ENCOUNTER — Ambulatory Visit (INDEPENDENT_AMBULATORY_CARE_PROVIDER_SITE_OTHER): Payer: PPO | Admitting: Family Medicine

## 2017-06-17 ENCOUNTER — Encounter: Payer: Self-pay | Admitting: Family Medicine

## 2017-06-17 VITALS — BP 124/68 | HR 76 | Temp 98.0°F | Resp 16 | Ht 67.0 in | Wt 207.7 lb

## 2017-06-17 DIAGNOSIS — I10 Essential (primary) hypertension: Secondary | ICD-10-CM

## 2017-06-17 MED ORDER — ATENOLOL 25 MG PO TABS: 25.0000 mg | ORAL_TABLET | Freq: Every day | ORAL | 1 refills | Status: DC

## 2017-06-17 NOTE — Progress Notes (Signed)
Name: Morgan Peterson   MRN: [id number]    DOB: 1951/07/22   Date:06/17/2017       Progress Note  Subjective  Chief Complaint  Chief Complaint  Patient presents with  . Hypertension    Patient could not tolerate Coreg last visit due to it making her tongue and mouth tingling. So Dr. Sanda Klein changed it to Atenolol and is doing well.    HPI  HTN: she is doing well, stopped Bystolic because of cost, was given Coreg but stopped because it caused tingling on lips and tongue, she has been on Atenolol, no palpitation, chest pain or SOB. She has been checking bp at home and has been  130's/70's. No side effects of medication   Patient Active Problem List   Diagnosis Date Noted  . Irritable bowel syndrome with constipation 05/02/2017  . Vitamin D deficiency 09/09/2016  . Hiatal hernia 06/02/2016  . Allergic rhinitis, seasonal 06/02/2016  . Adult BMI 30+ 04/30/2016  . Angio-edema 04/30/2016  . GAD (generalized anxiety disorder) 11/04/2015  . Hyperlipidemia 11/04/2015  . Cervical radiculopathy at C6 07/31/2015  . Neuroma digital nerve 07/31/2015  . Type 2 diabetes mellitus with hyperglycemia, with long-term current use of insulin ([place]) 06/30/2015  . Right shoulder pain 06/30/2015  . Cervical disc disease 06/30/2015  . Essential hypertension 06/30/2015  . History of breast cancer in female 01/07/2014    Past Surgical History:  Procedure Laterality Date  . ABDOMINAL EXPLORATION SURGERY  2000  . BREAST BIOPSY Right 2010   +   . BREAST EXCISIONAL BIOPSY Right 2010   + mammo site inasive mammo ca  . BREAST LUMPECTOMY Right 2010  . COLONOSCOPY  2003, 2013   Dr Alveta Heimlich, Dr. Bary Castilla  . TUBAL LIGATION  20 years ago    Family History  Problem Relation Age of Onset  . Hypertension Mother   . Hyperlipidemia Mother   . Diabetes Father   . Kidney disease Father   . Diabetes Brother   . Colon cancer Maternal Uncle   . Breast cancer Maternal Aunt     Social History   Social History  .  Marital status: Married    Spouse name: N/A  . Number of children: N/A  . Years of education: N/A   Occupational History  . Not on file.   Social History Main Topics  . Smoking status: Former Smoker    Packs/day: 0.25    Years: 10.00    Types: Cigarettes  . Smokeless tobacco: Never Used  . Alcohol use 0.0 oz/week  . Drug use: No  . Sexual activity: Not Currently     Comment: husband has ED   Other Topics Concern  . Not on file   Social History Narrative  . No narrative on file     Current Outpatient Prescriptions:  .  atenolol (TENORMIN) 25 MG tablet, Take 1 tablet (25 mg total) by mouth daily. This replaces coreg/bystolic, Disp: 30 tablet, Rfl: 1 .  Blood Glucose Monitoring Suppl (ONETOUCH VERIO) w/Device KIT, 1 Device by Does not apply route once., Disp: 1 kit, Rfl: 0 .  calcium carbonate 200 MG capsule, Take 200 mg by mouth 2 (two) times daily with a meal. , Disp: , Rfl:  .  cholecalciferol (VITAMIN D) 1000 UNITS tablet, Take 1,000 Units by mouth daily., Disp: , Rfl:  .  fluticasone (FLONASE) 50 MCG/ACT nasal spray, Place 2 sprays into both nostrils daily., Disp: 16 g, Rfl: 2 .  glucose blood (ONETOUCH VERIO)  test strip, Use as instructed, Disp: 100 each, Rfl: 12 .  Lancets (ONETOUCH ULTRASOFT) lancets, Use as instructed, Disp: 100 each, Rfl: 3 .  LANTUS SOLOSTAR 100 UNIT/ML Solostar Pen, Inject 12 Units into the skin daily., Disp: , Rfl: 2 .  liraglutide (VICTOZA) 18 MG/3ML SOPN, Inject 0.3 mLs (1.8 mg total) into the skin daily., Disp: 9 mL, Rfl: 0 .  LORazepam (ATIVAN) 0.5 MG tablet, Take 1 tablet (0.5 mg total) by mouth daily as needed for anxiety., Disp: 30 tablet, Rfl: 0 .  polyethylene glycol powder (GLYCOLAX/MIRALAX) powder, Take 17 g by mouth 2 (two) times daily as needed., Disp: 3350 g, Rfl: 1 .  SUMAtriptan (IMITREX) 50 MG tablet, Take 1 tablet (50 mg total) by mouth every 2 (two) hours as needed for migraine. May repeat in 2 hours if headache persists or recurs.,  Disp: 10 tablet, Rfl: 0 .  triamterene-hydrochlorothiazide (MAXZIDE-25) 37.5-25 MG tablet, Take 1 tablet by mouth daily., Disp: 90 tablet, Rfl: 1 .  Vitamin D, Ergocalciferol, (DRISDOL) 50000 units CAPS capsule, TAKE 1 CAPSULE (50,000 UNITS TOTAL) BY MOUTH EVERY 7 (SEVEN) DAYS. (Patient not taking: Reported on 06/17/2017), Disp: 12 capsule, Rfl: 0  Allergies  Allergen Reactions  . Levaquin [Levofloxacin In D5w] Swelling    Other reaction(s): Arthralgia (Joint Pain)  . Losartan     Other reaction(s): Angioedema  . Amlodipine-Olmesartan   . Codeine   . Gabapentin     cns side effects.  . Glipizide     Other reaction(s): Abdominal pain  . Invokana [Canagliflozin] Other (See Comments)    Bladder pain  . Lisinopril Swelling    Angioedema  . Metformin And Related Nausea Only  . Onglyza [Saxagliptin] Other (See Comments)    Increased PVC's  . Tramadol Nausea And Vomiting  . Actos [Pioglitazone] Other (See Comments) and Nausea And Vomiting    Bladder pain  . Carvedilol Other (See Comments)    NUMBNESS, TINGLING, ACHING IN ARMS  . Prednisone Palpitations     ROS  Constitutional: Negative for fever or weight change.  Respiratory: Negative for cough and shortness of breath.   Cardiovascular: Negative for chest pain or palpitations.  Gastrointestinal: Negative for abdominal pain, no bowel changes.  Musculoskeletal: Negative for gait problem or joint swelling.  Skin: Negative for rash.  Neurological: Negative for dizziness or headache.  No other specific complaints in a complete review of systems (except as listed in HPI above).  Objective  Vitals:   06/17/17 1345  BP: 124/68  Pulse: 76  Resp: 16  Temp: 98 F (36.7 C)  TempSrc: Oral  SpO2: 97%  Weight: 207 lb 11.2 oz (94.2 kg)  Height: '5\' 7"'  (1.702 m)    Body mass index is 32.53 kg/m.  Physical Exam  Constitutional: Patient appears well-developed and well-nourished. Obese  No distress.  HEENT: head atraumatic,  normocephalic, pupils equal and reactive to light, neck supple, throat within normal limits Cardiovascular: Normal rate, regular rhythm and normal heart sounds.  No murmur heard. No BLE edema. Pulmonary/Chest: Effort normal and breath sounds normal. No respiratory distress. Abdominal: Soft.  There is no tenderness. Psychiatric: Patient has a normal mood and affect. behavior is normal. Judgment and thought content normal.  Recent Results (from the past 2160 hour(s))  Culture, Group A Strep     Status: None   Collection Time: 03/23/17 11:26 AM  Result Value Ref Range   Organism ID, Bacteria NO GROUP A STREP (S. PYOGENES) ISOLATED   POCT rapid strep  A     Status: Normal   Collection Time: 03/23/17 11:33 AM  Result Value Ref Range   Rapid Strep A Screen Negative Negative  POCT Influenza A/B     Status: Abnormal   Collection Time: 03/23/17 11:34 AM  Result Value Ref Range   Influenza A, POC Negative Negative   Influenza B, POC Positive (A) Negative     PHQ2/9: Depression screen [facility] 2/9 06/17/2017 03/23/2017 10/28/2016 09/03/2016 06/02/2016  Decreased Interest 0 0 0 0 0  Down, Depressed, Hopeless 0 0 0 0 0  PHQ - 2 Score 0 0 0 0 0     Fall Risk: Fall Risk  06/17/2017 03/23/2017 10/28/2016 09/03/2016 06/02/2016  Falls in the past year? No No No No No    Functional Status Survey: Is the patient deaf or have difficulty hearing?: No Does the patient have difficulty seeing, even when wearing glasses/contacts?: No Does the patient have difficulty concentrating, remembering, or making decisions?: No Does the patient have difficulty walking or climbing stairs?: No Does the patient have difficulty dressing or bathing?: No Does the patient have difficulty doing errands alone such as visiting a doctor's office or shopping?: No   Assessment & Plan   1. Essential hypertension  Doing well with Atenolol, also no migraine in months - atenolol (TENORMIN) 25 MG tablet; Take 1 tablet (25 mg total) by  mouth daily.  Dispense: 90 tablet; Refill: 1

## 2017-06-20 ENCOUNTER — Other Ambulatory Visit: Payer: Self-pay

## 2017-06-20 DIAGNOSIS — J302 Other seasonal allergic rhinitis: Secondary | ICD-10-CM

## 2017-06-20 MED ORDER — FLUTICASONE PROPIONATE 50 MCG/ACT NA SUSP: 2.0000 | Freq: Every day | NASAL | 2 refills | Status: DC

## 2017-06-20 NOTE — Telephone Encounter (Signed)
[facility] 

## 2017-07-05 ENCOUNTER — Telehealth: Payer: Self-pay | Admitting: Family Medicine

## 2017-07-05 NOTE — Telephone Encounter (Signed)
Per the request of Dr. Ancil Boozer, please have patient to follow up with her endocrinologist or come in for a diabetic f/u.

## 2017-07-05 NOTE — Telephone Encounter (Signed)
Please call [employer] (P) [phone]. Stating that soliqua is needing prior authorization. Pt trying contacting the insurance company however they told her that we would have to call to see if they would cover. Please call pt for further explanation

## 2017-07-05 NOTE — Telephone Encounter (Signed)
Pt states that Dr Ancil Boozer is the one that suggested at her last appt for the patient to take the insulin with both victoza and lantus in it and for her to mention it to her Endo. She also stated that Dr Ancil Boozer said her diabetes where under control and that she can stop going to the endocrinologist. Pt states she was just in here on 6/29 and that she has follow up visit for august. Please advise

## 2017-07-06 ENCOUNTER — Other Ambulatory Visit: Payer: Self-pay | Admitting: Family Medicine

## 2017-07-06 NOTE — Telephone Encounter (Signed)
Patient stated that she was getting ready to go for a training appointment but she will call back next week to make an appointment.

## 2017-07-06 NOTE — Telephone Encounter (Signed)
We talked about it, but she is due for repeat hgba1C in 10 days, I recommend her to stay on current dose and if she will see me for DM management come in for repeat hgbA1C and change in medication

## 2017-08-05 ENCOUNTER — Other Ambulatory Visit: Payer: Self-pay | Admitting: Family Medicine

## 2017-08-05 DIAGNOSIS — G43009 Migraine without aura, not intractable, without status migrainosus: Secondary | ICD-10-CM

## 2017-08-05 NOTE — Telephone Encounter (Signed)
Patient requesting [facility]an [facility]

## 2017-08-18 ENCOUNTER — Ambulatory Visit (INDEPENDENT_AMBULATORY_CARE_PROVIDER_SITE_OTHER): Payer: PPO | Admitting: Family Medicine

## 2017-08-18 ENCOUNTER — Encounter: Payer: Self-pay | Admitting: Family Medicine

## 2017-08-18 VITALS — BP 128/78 | HR 78 | Temp 97.9°F | Resp 16 | Ht 67.0 in | Wt 208.6 lb

## 2017-08-18 DIAGNOSIS — Z23 Encounter for immunization: Secondary | ICD-10-CM | POA: Diagnosis not present

## 2017-08-18 DIAGNOSIS — I8390 Asymptomatic varicose veins of unspecified lower extremity: Secondary | ICD-10-CM

## 2017-08-18 DIAGNOSIS — G8929 Other chronic pain: Secondary | ICD-10-CM | POA: Diagnosis not present

## 2017-08-18 DIAGNOSIS — G43009 Migraine without aura, not intractable, without status migrainosus: Secondary | ICD-10-CM | POA: Diagnosis not present

## 2017-08-18 DIAGNOSIS — E1169 Type 2 diabetes mellitus with other specified complication: Secondary | ICD-10-CM

## 2017-08-18 DIAGNOSIS — E669 Obesity, unspecified: Secondary | ICD-10-CM | POA: Diagnosis not present

## 2017-08-18 DIAGNOSIS — I10 Essential (primary) hypertension: Secondary | ICD-10-CM

## 2017-08-18 DIAGNOSIS — M545 Low back pain, unspecified: Secondary | ICD-10-CM

## 2017-08-18 DIAGNOSIS — E785 Hyperlipidemia, unspecified: Secondary | ICD-10-CM

## 2017-08-18 MED ORDER — ASPIRIN EC 81 MG PO TBEC: 81.0000 mg | DELAYED_RELEASE_TABLET | Freq: Every day | ORAL | 0 refills | Status: DC

## 2017-08-18 MED ORDER — IBUPROFEN 800 MG PO TABS: 800.0000 mg | ORAL_TABLET | Freq: Three times a day (TID) | ORAL | 0 refills | Status: DC | PRN

## 2017-08-18 MED ORDER — INSULIN GLARGINE 100 UNIT/ML SOLOSTAR PEN: 8.0000 [IU] | PEN_INJECTOR | Freq: Every day | SUBCUTANEOUS | 99 refills | Status: DC

## 2017-08-18 NOTE — Addendum Note (Signed)
Addended by: Steele Sizer F on: 08/18/2017 10:37 AM   Modules accepted: Orders

## 2017-08-18 NOTE — Progress Notes (Addendum)
Name: Morgan Peterson   MRN: [id number]    DOB: December 07, 1951   Date:08/18/2017       Progress Note  Subjective  Chief Complaint  Chief Complaint  Patient presents with  . Migraine    2 month follow up  . Diabetes    sees endocrinologist  . Hypertension    no issues  . Varicose Veins    left lower leg has occasional pain  . Flu Vaccine    HPI   HTN: she is doing well, stopped Bystolic because of cost, was given Coreg but stopped because it caused tingling on lips and tongue, she has been on Atenolol, no palpitation, chest pain or SOB. BP is at goal, no side effects of medication.  Migraine headaches: she is not taking Topamax because she likes drinking red wine occasionally. Migraine is  usually on the left side of head, it starts as a pressure on left temporal area and radiates to nuchal area, scalp is sensitive to touch on left occipital area, she has associated photophobia, but denies phonophobia. At times she has nausea, but no vomiting. She denies weakness. Symptoms improves with Imitrex and rest within one hour.  GAD: she is not on SSRI, she is doing well now, she was carrying for her sister at the time, doing well now.   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 She states her bowel movements about 4 times a week , taking MIralax and it is working well, takes it every other at this time. No blood or mucus in stools.   DM: seeing Endocrinologist Dr. Elsie Stain and is now on Victoza and Lantus 10 units daily . Last hgbA1C was 6.1% and negative urine micro 02/2017. No polyphagia, no polydipsia or polyuria. Reviewed records from [place], [place] was in the 13 range about one year ago. She states occasionally glucose is going down to 50's, we will titrate down lantus. Since glucose is at goal again she will stop seeing Endo and we will take care of her DM from now on   Varicose vein left leg: she has noticed some pain on left lower leg at the end of the day, no  swelling, no redness, advised compression stocking hoses  Chronic low back pain: she states symptoms started in the 90's, from an injury at work, moving patients, she states she has aching pain on lumbar spine, no radiculitis, takes ibuprofen prn and would like a refill, discussed risk of NSAID's  Patient Active Problem List   Diagnosis Date Noted  . Chronic low back pain without sciatica 08/18/2017  . Irritable bowel syndrome with constipation 05/02/2017  . Vitamin D deficiency 09/09/2016  . Hiatal hernia 06/02/2016  . Allergic rhinitis, seasonal 06/02/2016  . Adult BMI 30+ 04/30/2016  . Angio-edema 04/30/2016  . Hyperlipidemia 11/04/2015  . Cervical radiculopathy at C6 07/31/2015  . Neuroma digital nerve 07/31/2015  . Type 2 diabetes mellitus with hyperglycemia, with long-term current use of insulin ([place]) 06/30/2015  . Right shoulder pain 06/30/2015  . Cervical disc disease 06/30/2015  . Essential hypertension 06/30/2015  . History of breast cancer in female 01/07/2014    Past Surgical History:  Procedure Laterality Date  . ABDOMINAL EXPLORATION SURGERY  2000  . BREAST BIOPSY Right 2010   +   . BREAST EXCISIONAL BIOPSY Right 2010   + mammo site inasive mammo ca  . BREAST LUMPECTOMY Right 2010  . COLONOSCOPY  2003, 2013   Dr Alveta Heimlich, Dr. Bary Castilla  .  TUBAL LIGATION  20 years ago    Family History  Problem Relation Age of Onset  . Hypertension Mother   . Hyperlipidemia Mother   . Diabetes Father   . Kidney disease Father   . Diabetes Brother   . Colon cancer Maternal Uncle   . Breast cancer Maternal Aunt     Social History   Social History  . Marital status: Married    Spouse name: N/A  . Number of children: N/A  . Years of education: N/A   Occupational History  . Not on file.   Social History Main Topics  . Smoking status: Former Smoker    Packs/day: 0.25    Years: 10.00    Types: Cigarettes  . Smokeless tobacco: Never Used  . Alcohol use 0.0 oz/week  .  Drug use: No  . Sexual activity: Not Currently     Comment: husband has ED   Other Topics Concern  . Not on file   Social History Narrative  . No narrative on file     Current Outpatient Prescriptions:  .  atenolol (TENORMIN) 25 MG tablet, Take 1 tablet (25 mg total) by mouth daily., Disp: 90 tablet, Rfl: 1 .  Blood Glucose Monitoring Suppl (ONETOUCH VERIO) w/Device KIT, 1 Device by Does not apply route once., Disp: 1 kit, Rfl: 0 .  calcium carbonate 200 MG capsule, Take 200 mg by mouth 2 (two) times daily with a meal. , Disp: , Rfl:  .  cholecalciferol (VITAMIN D) 1000 UNITS tablet, Take 1,000 Units by mouth daily., Disp: , Rfl:  .  fluticasone (FLONASE) 50 MCG/ACT nasal spray, Place 2 sprays into both nostrils daily., Disp: 16 g, Rfl: 2 .  glucose blood (ONETOUCH VERIO) test strip, Use as instructed, Disp: 100 each, Rfl: 12 .  Lancets (ONETOUCH ULTRASOFT) lancets, Use as instructed, Disp: 100 each, Rfl: 3 .  polyethylene glycol powder (GLYCOLAX/MIRALAX) powder, Take 17 g by mouth 2 (two) times daily as needed., Disp: 3350 g, Rfl: 1 .  SUMAtriptan (IMITREX) 50 MG tablet, TAKE 1 TABLET BY MOUTH EVERY 2 HOURS AS NEEDED FOR MIGRAINE-MAY REPEAT IN 2 HOURS IF HEADACHE PERSIS, Disp: 10 tablet, Rfl: 0 .  triamterene-hydrochlorothiazide (MAXZIDE-25) 37.5-25 MG tablet, Take 1 tablet by mouth daily., Disp: 90 tablet, Rfl: 1 .  VICTOZA 18 MG/3ML SOPN, INJECT 0.3 MLS (1.8 MG TOTAL) SUBCUTANEOUSLY ONCE DAILY., Disp: , Rfl: 3 .  aspirin EC 81 MG tablet, Take 1 tablet (81 mg total) by mouth daily., Disp: 30 tablet, Rfl: 0 .  ibuprofen (ADVIL,MOTRIN) 800 MG tablet, Take 1 tablet (800 mg total) by mouth every 8 (eight) hours as needed., Disp: 30 tablet, Rfl: 0 .  Insulin Glargine (LANTUS SOLOSTAR) 100 UNIT/ML Solostar Pen, Inject 8 Units into the skin daily at 10 pm., Disp: 5 pen, Rfl: PRN  Allergies  Allergen Reactions  . Levaquin [Levofloxacin In D5w] Swelling    Other reaction(s): Arthralgia (Joint  Pain)  . Losartan     Other reaction(s): Angioedema  . Amlodipine-Olmesartan   . Codeine   . Gabapentin     cns side effects.  . Glipizide     Other reaction(s): Abdominal pain  . Invokana [Canagliflozin] Other (See Comments)    Bladder pain  . Lisinopril Swelling    Angioedema  . Metformin And Related Nausea Only  . Onglyza [Saxagliptin] Other (See Comments)    Increased PVC's  . Tramadol Nausea And Vomiting  . Actos [Pioglitazone] Other (See Comments) and Nausea And  Vomiting    Bladder pain  . Carvedilol Other (See Comments)    NUMBNESS, TINGLING, ACHING IN ARMS  . Prednisone Palpitations     ROS  Constitutional: Negative for fever or weight change.  Respiratory: Negative for cough and shortness of breath.   Cardiovascular: Negative for chest pain or palpitations.  Gastrointestinal: Negative for abdominal pain, no bowel changes.  Musculoskeletal: Negative for gait problem or joint swelling.  Skin: Negative for rash.  Neurological: Negative for dizziness or headache.  No other specific complaints in a complete review of systems (except as listed in HPI above).  Objective  Vitals:   08/18/17 1000  BP: 128/78  Pulse: 78  Resp: 16  Temp: 97.9 F (36.6 C)  SpO2: 96%  Weight: 208 lb 9 oz (94.6 kg)  Height: _0  (1.702 m)    Body mass index is 32.67 kg/m.  Physical Exam  Constitutional: Patient appears well-developed and well-nourished. Obese No distress.  HEENT: head atraumatic, normocephalic, pupils equal and reactive to light, neck supple, throat within normal limits Cardiovascular: Normal rate, regular rhythm and normal heart sounds.  No murmur heard. No BLE edema, small varicose vein on left lower leg Pulmonary/Chest: Effort normal and breath sounds normal. No respiratory distress. Abdominal: Soft.  There is no tenderness. Psychiatric: Patient has a normal mood and affect. behavior is normal. Judgment and thought content normal.  PHQ2/9: Depression  screen [facility] 2/9 06/17/2017 03/23/2017 10/28/2016 09/03/2016 06/02/2016  Decreased Interest 0 0 0 0 0  Down, Depressed, Hopeless 0 0 0 0 0  PHQ - 2 Score 0 0 0 0 0     Fall Risk: Fall Risk  06/17/2017 03/23/2017 10/28/2016 09/03/2016 06/02/2016  Falls in the past year? _1      Assessment & Plan  1. Essential hypertension  - COMPLETE METABOLIC PANEL WITH GFR  2. Need for influenza vaccination  - Flu vaccine HIGH DOSE PF (Fluzone High dose)  3. Migraine without aura and without status migrainosus, not intractable  Doing well at this time  4. Dyslipidemia associated with type 2 diabetes mellitus ([place])  - Lipid panel  5. Diabetes mellitus type 2 in obese ([facility])  - Hemoglobin A1c Doing well, currently on Lantus and Victoza, denies side effects, we will titrate down lantus  6. Varicose vein of leg  Advised compression stocking hoses  7. Chronic bilateral low back pain without sciatica  - ibuprofen (ADVIL,MOTRIN) 800 MG tablet; Take 1 tablet (800 mg total) by mouth every 8 (eight) hours as needed.  Dispense: 30 tablet; Refill: 0

## 2017-09-13 ENCOUNTER — Other Ambulatory Visit: Payer: Self-pay | Admitting: Family Medicine

## 2017-09-13 DIAGNOSIS — E785 Hyperlipidemia, unspecified: Secondary | ICD-10-CM | POA: Diagnosis not present

## 2017-09-13 DIAGNOSIS — E669 Obesity, unspecified: Secondary | ICD-10-CM | POA: Diagnosis not present

## 2017-09-13 DIAGNOSIS — I10 Essential (primary) hypertension: Secondary | ICD-10-CM | POA: Diagnosis not present

## 2017-09-13 DIAGNOSIS — E1169 Type 2 diabetes mellitus with other specified complication: Secondary | ICD-10-CM | POA: Diagnosis not present

## 2017-09-14 ENCOUNTER — Ambulatory Visit (INDEPENDENT_AMBULATORY_CARE_PROVIDER_SITE_OTHER): Payer: PPO | Admitting: Family Medicine

## 2017-09-14 ENCOUNTER — Encounter: Payer: Self-pay | Admitting: Family Medicine

## 2017-09-14 VITALS — BP 100/60 | HR 72 | Temp 97.9°F | Resp 12 | Wt 208.4 lb

## 2017-09-14 DIAGNOSIS — E1169 Type 2 diabetes mellitus with other specified complication: Secondary | ICD-10-CM

## 2017-09-14 DIAGNOSIS — E785 Hyperlipidemia, unspecified: Secondary | ICD-10-CM

## 2017-09-14 DIAGNOSIS — E669 Obesity, unspecified: Secondary | ICD-10-CM | POA: Diagnosis not present

## 2017-09-14 DIAGNOSIS — Z0000 Encounter for general adult medical examination without abnormal findings: Secondary | ICD-10-CM | POA: Diagnosis not present

## 2017-09-14 DIAGNOSIS — E2839 Other primary ovarian failure: Secondary | ICD-10-CM | POA: Diagnosis not present

## 2017-09-14 DIAGNOSIS — I10 Essential (primary) hypertension: Secondary | ICD-10-CM

## 2017-09-14 LAB — COMPREHENSIVE METABOLIC PANEL
A/G RATIO: 1.3 (ref 1.2–2.2)
ALBUMIN: 3.9 g/dL (ref 3.6–4.8)
ALK PHOS: 112 IU/L (ref 39–117)
ALT: 11 IU/L (ref 0–32)
AST: 18 IU/L (ref 0–40)
BILIRUBIN TOTAL: 0.4 mg/dL (ref 0.0–1.2)
BUN / CREAT RATIO: 18 (ref 12–28)
BUN: 15 mg/dL (ref 8–27)
CHLORIDE: 102 mmol/L (ref 96–106)
CO2: 28 mmol/L (ref 20–29)
Calcium: 9.4 mg/dL (ref 8.7–10.3)
Creatinine, Ser: 0.82 mg/dL (ref 0.57–1.00)
GFR calc Af Amer: 87 mL/min/{1.73_m2} (ref >59)
GFR calc non Af Amer: 75 mL/min/{1.73_m2} (ref >59)
GLUCOSE: 86 mg/dL (ref 65–99)
Globulin, Total: 3.1 g/dL (ref 1.5–4.5)
POTASSIUM: 4.1 mmol/L (ref 3.5–5.2)
Sodium: 142 mmol/L (ref 134–144)
Total Protein: 7 g/dL (ref 6.0–8.5)

## 2017-09-14 LAB — LIPID PANEL WITH LDL/HDL RATIO
Cholesterol, Total: 193 mg/dL (ref 100–199)
HDL: 49 mg/dL
LDL Calculated: 130 mg/dL — ABNORMAL HIGH (ref 0–99)
LDl/HDL Ratio: 2.7 ratio (ref 0.0–3.2)
Triglycerides: 70 mg/dL (ref 0–149)
VLDL Cholesterol Cal: 14 mg/dL (ref 5–40)

## 2017-09-14 LAB — HGB A1C W/O EAG: Hgb A1c MFr Bld: 6.4 % — ABNORMAL HIGH (ref 4.8–5.6)

## 2017-09-14 MED ORDER — ATENOLOL 25 MG PO TABS: 25.0000 mg | ORAL_TABLET | Freq: Every day | ORAL | 1 refills | Status: DC

## 2017-09-14 MED ORDER — ROSUVASTATIN CALCIUM 5 MG PO TABS: 5.0000 mg | ORAL_TABLET | Freq: Every day | ORAL | 1 refills | Status: DC

## 2017-09-14 MED ORDER — TRIAMTERENE-HCTZ 37.5-25 MG PO TABS: 1.0000 | ORAL_TABLET | Freq: Every day | ORAL | 1 refills | Status: DC

## 2017-09-14 MED ORDER — VICTOZA 18 MG/3ML ~~LOC~~ SOPN: 1.8000 mg | PEN_INJECTOR | Freq: Every day | SUBCUTANEOUS | 2 refills | Status: DC

## 2017-09-14 NOTE — Patient Instructions (Signed)
Preventive Care 66 Years and Older, Female Preventive care refers to lifestyle choices and visits with your health care provider that can promote health and wellness. What does preventive care include?  A yearly physical exam. This is also called an annual well check.  Dental exams once or twice a year.  Routine eye exams. Ask your health care provider how often you should have your eyes checked.  Personal lifestyle choices, including: ? Daily care of your teeth and gums. ? Regular physical activity. ? Eating a healthy diet. ? Avoiding tobacco and drug use. ? Limiting alcohol use. ? Practicing safe sex. ? Taking low-dose aspirin every day. ? Taking vitamin and mineral supplements as recommended by your health care provider. What happens during an annual well check? The services and screenings done by your health care provider during your annual well check will depend on your age, overall health, lifestyle risk factors, and family history of disease. Counseling Your health care provider may ask you questions about your:  Alcohol use.  Tobacco use.  Drug use.  Emotional well-being.  Home and relationship well-being.  Sexual activity.  Eating habits.  History of falls.  Memory and ability to understand (cognition).  Work and work environment.  Reproductive health.  Screening You may have the following tests or measurements:  Height, weight, and BMI.  Blood pressure.  Lipid and cholesterol levels. These may be checked every 5 years, or more frequently if you are over 50 years old.  Skin check.  Lung cancer screening. You may have this screening every year starting at age 55 if you have a 30-pack-year history of smoking and currently smoke or have quit within the past 15 years.  Fecal occult blood test (FOBT) of the stool. You may have this test every year starting at age 50.  Flexible sigmoidoscopy or colonoscopy. You may have a sigmoidoscopy every 5 years or  a colonoscopy every 10 years starting at age 50.  Hepatitis C blood test.  Hepatitis B blood test.  Sexually transmitted disease (STD) testing.  Diabetes screening. This is done by checking your blood sugar (glucose) after you have not eaten for a while (fasting). You may have this done every 1-3 years.  Bone density scan. This is done to screen for osteoporosis. You may have this done starting at age 65.  Mammogram. This may be done every 1-2 years. Talk to your health care provider about how often you should have regular mammograms.  Talk with your health care provider about your test results, treatment options, and if necessary, the need for more tests. Vaccines Your health care provider may recommend certain vaccines, such as:  Influenza vaccine. This is recommended every year.  Tetanus, diphtheria, and acellular pertussis (Tdap, Td) vaccine. You may need a Td booster every 10 years.  Varicella vaccine. You may need this if you have not been vaccinated.  Zoster vaccine. You may need this after age 60.  Measles, mumps, and rubella (MMR) vaccine. You may need at least one dose of MMR if you were born in 1957 or later. You may also need a second dose.  Pneumococcal 13-valent conjugate (PCV13) vaccine. One dose is recommended after age 65.  Pneumococcal polysaccharide (PPSV23) vaccine. One dose is recommended after age 65.  Meningococcal vaccine. You may need this if you have certain conditions.  Hepatitis A vaccine. You may need this if you have certain conditions or if you travel or work in places where you may be exposed to hepatitis   A.  Hepatitis B vaccine. You may need this if you have certain conditions or if you travel or work in places where you may be exposed to hepatitis B.  Haemophilus influenzae type b (Hib) vaccine. You may need this if you have certain conditions.  Talk to your health care provider about which screenings and vaccines you need and how often you  need them. This information is not intended to replace advice given to you by your health care provider. Make sure you discuss any questions you have with your health care provider. Document Released: 01/02/2016 Document Revised: 08/25/2016 Document Reviewed: 10/07/2015 Elsevier Interactive Patient Education  2017 [employer].

## 2017-09-14 NOTE — Progress Notes (Signed)
Patient: Morgan Peterson, Female    DOB: [birth year], 66 y.o.   MRN: [id number]  Visit Date: 09/14/2017  Today's Provider: Loistine Chance, MD   Chief Complaint  Patient presents with  . Medicare Wellness  . Follow-up    Subjective:    HPI Morgan Peterson is a 66 y.o. female who presents today for her Subsequent Annual Wellness Visit. Also follow up  HTN: bp is at goal today, she has been taking medication as prescribed, denies chest pain or palpitation, on Atenolol and diuretics, no side effects  DM: she was seeing Endocrinologist. Dr. Elsie Stain because hgbA1C 13% April 2017 she was  Place of Victoza and Lantus. Last hgbA1C was 6.1% today is at 6.4%  and negative urine micro 02/2017. No polyphagia, no polydipsia or polyuria. She stopped taking statin because of muscle pain, however explained importance of statin therapy with her risk, and she will try low dose Crestor, cannot afford taking Livalo because she is in the doughnut hole, she is also struggling with cost of Victoza and we will give her some samples.   Review of Systems  Constitutional: Negative for fever or weight change.  Respiratory: Negative for cough and shortness of breath.   Cardiovascular: Negative for chest pain or palpitations.  Gastrointestinal: Negative for abdominal pain, no bowel changes.  Musculoskeletal: Negative for gait problem or joint swelling.  Skin: Negative for rash.  Neurological: Negative for dizziness or headache.  No other specific complaints in a complete review of systems (except as listed in HPI above).  Past Medical History:  Diagnosis Date  . Cancer [facility]) 2010   Right Breast  . Diabetes mellitus without complication ([place]) 6629  . Hypertension   . IBS (irritable bowel syndrome)   . 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 Arimidex therapy completed September 2015  . Personal history of malignant neoplasm of breast 2010    Past Surgical History:  Procedure Laterality Date  . ABDOMINAL EXPLORATION SURGERY  2000  . BREAST BIOPSY Right 2010   +   . BREAST EXCISIONAL BIOPSY Right 2010   + mammo site inasive mammo ca  . BREAST LUMPECTOMY Right 2010  . COLONOSCOPY  2003, 2013   Dr Alveta Heimlich, Dr. Bary Castilla  . TUBAL LIGATION  20 years ago    Family History  Problem Relation Age of Onset  . Hypertension Mother   . Hyperlipidemia Mother   . Diabetes Father   . Kidney disease Father   . Diabetes Brother   . Colon cancer Maternal Uncle   . Breast cancer Maternal Aunt     Social History   Social History  . Marital status: Married    Spouse name: N/A  . Number of children: N/A  . Years of education: N/A   Occupational History  . Not on file.   Social History Main Topics  . Smoking status: Former Smoker    Packs/day: 0.25    Years: 10.00    Types: Cigarettes  . Smokeless tobacco: Never Used  . Alcohol use 0.0 oz/week  . Drug use: No  . Sexual activity: Not Currently     Comment: husband has ED   Other Topics Concern  . Not on file   Social History Narrative  . No narrative on file    Outpatient Encounter Prescriptions  as of 09/14/2017  Medication Sig  . aspirin EC 81 MG tablet Take 1 tablet (81 mg total) by mouth daily.  Marland Kitchen atenolol (TENORMIN) 25 MG tablet Take 1 tablet (25 mg total) by mouth daily.  . Blood Glucose Monitoring Suppl (ONETOUCH VERIO) w/Device KIT 1 Device by Does not apply route once.  . calcium carbonate 200 MG capsule Take 200 mg by mouth 2 (two) times daily with a meal.   . cholecalciferol (VITAMIN D) 1000 UNITS tablet Take 1,000 Units by mouth daily.  . fluticasone (FLONASE) 50 MCG/ACT nasal spray Place 2 sprays into both nostrils daily.  Marland Kitchen glucose blood (ONETOUCH VERIO) test strip Use as instructed  . ibuprofen (ADVIL,MOTRIN) 800 MG tablet Take 1 tablet  (800 mg total) by mouth every 8 (eight) hours as needed.  . Insulin Glargine (LANTUS SOLOSTAR) 100 UNIT/ML Solostar Pen Inject 8 Units into the skin daily at 10 pm.  . Lancets (ONETOUCH ULTRASOFT) lancets Use as instructed  . polyethylene glycol powder (GLYCOLAX/MIRALAX) powder Take 17 g by mouth 2 (two) times daily as needed.  . rosuvastatin (CRESTOR) 5 MG tablet Take 1 tablet (5 mg total) by mouth daily.  . SUMAtriptan (IMITREX) 50 MG tablet TAKE 1 TABLET BY MOUTH EVERY 2 HOURS AS NEEDED FOR MIGRAINE-MAY REPEAT IN 2 HOURS IF HEADACHE PERSIS  . triamterene-hydrochlorothiazide (MAXZIDE-25) 37.5-25 MG tablet Take 1 tablet by mouth daily.  Marland Kitchen VICTOZA 18 MG/3ML SOPN Inject 0.3 mLs (1.8 mg total) into the skin daily.  . [DISCONTINUED] atenolol (TENORMIN) 25 MG tablet Take 1 tablet (25 mg total) by mouth daily.  . [DISCONTINUED] triamterene-hydrochlorothiazide (MAXZIDE-25) 37.5-25 MG tablet Take 1 tablet by mouth daily.  . [DISCONTINUED] VICTOZA 18 MG/3ML SOPN INJECT 0.3 MLS (1.8 MG TOTAL) SUBCUTANEOUSLY ONCE DAILY.   No facility-administered encounter medications on file as of 09/14/2017.     Allergies  Allergen Reactions  . Levaquin [Levofloxacin In D5w] Swelling    Other reaction(s): Arthralgia (Joint Pain)  . Losartan     Other reaction(s): Angioedema  . Amlodipine-Olmesartan   . Codeine   . Gabapentin     cns side effects.  . Glipizide     Other reaction(s): Abdominal pain  . Invokana [Canagliflozin] Other (See Comments)    Bladder pain  . Lisinopril Swelling    Angioedema  . Metformin And Related Nausea Only  . Onglyza [Saxagliptin] Other (See Comments)    Increased PVC's  . Tramadol Nausea And Vomiting  . Actos [Pioglitazone] Other (See Comments) and Nausea And Vomiting    Bladder pain  . Carvedilol Other (See Comments)    NUMBNESS, TINGLING, ACHING IN ARMS  . Prednisone Palpitations    Care Team Updated in EHR: Yes  Last Vision Exam: today  Wears corrective lenses:  Yes Last Dental Exam: 2017 Last Hearing Exam: today Wears Hearing Aids: No  Functional Ability / Safety Screening 1.  Was the timed Get Up and Go test shorter than 30 seconds?  yes 2.  Does the patient need help with the phone, transportation, shopping,      preparing meals, housework, laundry, medications, or managing money?  yes 3.  Is the patient's home free of loose throw rugs in walkways, pet beds, electrical cords, etc?   yes      Grab bars in the bathroom? no      Handrails on the stairs?   yes      Adequate lighting?   yes 4.  Has the patient noticed any hearing difficulties?  no  Diet Recall and Exercise Regimen:  Current Exercise Habits: Structured exercise class, Type of exercise: Other - see comments (personal trainer), Time (Minutes): 60, Frequency (Times/Week): 2, Weekly Exercise (Minutes/Week): 120, Intensity: Intense   She is trying to improve her diet, her personal trainer helps with her diet  Advanced Care Planning: A voluntary discussion about advance care planning including the explanation and discussion of advance directives.  Discussed health care proxy and Living will, and the patient was able to identify a health care proxy as husband, or daughter .  Patient does not have a living will at present time. If patient does have living will, I have requested they bring this to the clinic to be scanned in to their chart. Does patient have a HCPOA?    no If yes, name and contact information:  Does patient have a living will or MOST form?  no  Cancer Screenings: Skin: no lesions Lung:  Low Dose CT Chest recommended if Age 68-80 years, 30 pack-year currently smoking OR have quit w/in 15years. Patient does not qualify. Quit smoking in 1988 Breast:  Up to date on Mammogram? Yes  Up to date of Bone Density/Dexa? No Colon: 2013 - Dr Fleet Contras  Additional Screenings:  Hepatitis B/HIV/Syphillis:she does not want to have it done Hepatitis C Screening: 11/22/2012 Intimate  Partner Violence: none  Objective:   Vitals: BP 100/60 (BP Location: Left Arm, Patient Position: Sitting, Cuff Size: Large)   Pulse 72   Temp 97.9 F (36.6 C) (Oral)   Resp 12   Wt 208 lb 6.4 oz (94.5 kg)   SpO2 95%   BMI 32.64 kg/m  Body mass index is 32.64 kg/m.   Hearing Screening   '125Hz'  '250Hz'  '500Hz'  '1000Hz'  '2000Hz'  '3000Hz'  '4000Hz'  '6000Hz'  '8000Hz'   Right ear:   '20 20 20  20    ' Left ear:   '20 20 20  20      ' Visual Acuity Screening   Right eye Left eye Both eyes  Without correction:     With correction: '20/20 20/25 20/25 '    Physical Exam Constitutional: Patient appears well-developed and well-nourished. Obese  No distress.  HEENT: head atraumatic, normocephalic, pupils equal and reactive to light, ears normal TM bilaterally,  neck supple, throat within normal limits Cardiovascular: Normal rate, regular rhythm and normal heart sounds.  No murmur heard. No BLE edema. Pulmonary/Chest: Effort normal and breath sounds normal. No respiratory distress. Abdominal: Soft.  There is no tenderness. Psychiatric: Patient has a normal mood and affect. behavior is normal. Judgment and thought content normal.  Cognitive Testing - 6-CIT  Correct? Score   What year is it? yes 0 Yes = 0    No = 4  What month is it? yes 0 Yes = 0    No = 3  Remember:     Pia [address]au, [place], [place]     What time is it? yes 0 Yes = 0    No = 3  Count backwards from 20 to 1 yes 0 Correct = 0    1 error = 2   More than 1 error = 4  Say the months of the year in reverse. yes 0 Correct = 0    1 error = 2   More than 1 error = 4  What address did I ask you to remember? yes 0 Correct = 0  1 error = 2    2 error = 4    3 error =  6    4 error = 8    All wrong = 10       TOTAL SCORE  0/28   Interpretation:  Normal  Normal (0-7) Abnormal (8-28)   Fall Risk: Fall Risk  09/14/2017 06/17/2017 03/23/2017 10/28/2016 09/03/2016  Falls in the past year? No No No No No    Depression Screen Depression screen [facility] 2/9  09/14/2017 06/17/2017 03/23/2017 10/28/2016 09/03/2016  Decreased Interest 0 0 0 0 0  Down, Depressed, Hopeless 0 0 0 0 0  PHQ - 2 Score 0 0 0 0 0    Recent Results (from the past 2160 hour(s))  Comprehensive metabolic panel     Status: None   Collection Time: 09/13/17 10:25 AM  Result Value Ref Range   Glucose 86 65 - 99 mg/dL   BUN 15 8 - 27 mg/dL   Creatinine, Ser 0.82 0.57 - 1.00 mg/dL   GFR calc non Af Amer 75 >59 mL/min/1.73   GFR calc Af Amer 87 >59 mL/min/1.73   BUN/Creatinine Ratio 18 12 - 28   Sodium 142 134 - 144 mmol/L   Potassium 4.1 3.5 - 5.2 mmol/L   Chloride 102 96 - 106 mmol/L   CO2 28 20 - 29 mmol/L   Calcium 9.4 8.7 - 10.3 mg/dL   Total Protein 7.0 6.0 - 8.5 g/dL   Albumin 3.9 3.6 - 4.8 g/dL   Globulin, Total 3.1 1.5 - 4.5 g/dL   Albumin/Globulin Ratio 1.3 1.2 - 2.2   Bilirubin Total 0.4 0.0 - 1.2 mg/dL   Alkaline Phosphatase 112 39 - 117 IU/L   AST 18 0 - 40 IU/L   ALT 11 0 - 32 IU/L  Lipid Panel With LDL/HDL Ratio     Status: Abnormal   Collection Time: 09/13/17 10:25 AM  Result Value Ref Range   Cholesterol, Total 193 100 - 199 mg/dL   Triglycerides 70 0 - 149 mg/dL   HDL 49 >39 mg/dL   VLDL Cholesterol Cal 14 5 - 40 mg/dL   LDL Calculated 130 (H) 0 - 99 mg/dL   LDl/HDL Ratio 2.7 0.0 - 3.2 ratio    Comment:                                     LDL/HDL Ratio                                             Men  Women                               1/2 Avg.Risk  1.0    1.5                                   Avg.Risk  3.6    3.2                                2X Avg.Risk  6.2    5.0  3X Avg.Risk  8.0    6.1   Hgb A1c w/o eAG     Status: Abnormal   Collection Time: 09/13/17 10:25 AM  Result Value Ref Range   Hgb A1c MFr Bld 6.4 (H) 4.8 - 5.6 %    Comment:          Prediabetes: 5.7 - 6.4          Diabetes: >6.4          Glycemic control for adults with diabetes: <7.0     Assessment & Plan:    1. Medicare annual wellness visit,  initial   2. Essential hypertension  - atenolol (TENORMIN) 25 MG tablet; Take 1 tablet (25 mg total) by mouth daily.  Dispense: 90 tablet; Refill: 1 - triamterene-hydrochlorothiazide (MAXZIDE-25) 37.5-25 MG tablet; Take 1 tablet by mouth daily.  Dispense: 90 tablet; Refill: 1  3. Dyslipidemia associated with type 2 diabetes mellitus ([place])  - VICTOZA 18 MG/3ML SOPN; Inject 0.3 mLs (1.8 mg total) into the skin daily.  Dispense: 5 pen; Refill: 2 - rosuvastatin (CRESTOR) 5 MG tablet; Take 1 tablet (5 mg total) by mouth daily.  Dispense: 90 tablet; Refill: 1  4. Diabetes mellitus type 2 in obese ([place])  - VICTOZA 18 MG/3ML SOPN; Inject 0.3 mLs (1.8 mg total) into the skin daily.  Dispense: 5 pen; Refill: 2  5. Ovarian failure  - DG Bone Density; Future   Exercise Activities and Dietary recommendations  - she would like to come off Lantus and continue Victoza , she has a Physiological scientist and will try to be more strict with diet   - Discussed health benefits of physical activity, and encouraged her to engage in regular exercise appropriate for her age and condition.   Immunization History  Administered Date(s) Administered  . Influenza, High Dose Seasonal PF 08/18/2017  . Influenza,inj,Quad PF,6+ Mos 10/03/2015, 09/03/2016  . Influenza-Unspecified 11/21/2014  . Pneumococcal Conjugate-13 05/14/2014  . Pneumococcal Polysaccharide-23 11/19/2010, 11/26/2016  . Tdap 11/19/2010  . Zoster 07/23/2013    Health Maintenance  Topic Date Due  . HIV Screening  11/17/2029 (Originally 09/25/1966)  . URINE MICROALBUMIN  11/30/2017  . FOOT EXAM  01/17/2018  . OPHTHALMOLOGY EXAM  01/17/2018  . HEMOGLOBIN A1C  03/14/2018  . MAMMOGRAM  01/20/2019  . TETANUS/TDAP  11/19/2020  . PAP SMEAR  09/03/2021  . COLONOSCOPY  06/19/2022  . INFLUENZA VACCINE  Completed  . DEXA SCAN  Completed  . Hepatitis C Screening  Completed  . PNA vac Low Risk Adult  Completed    Meds ordered this encounter   Medications  . atenolol (TENORMIN) 25 MG tablet    Sig: Take 1 tablet (25 mg total) by mouth daily.    Dispense:  90 tablet    Refill:  1  . triamterene-hydrochlorothiazide (MAXZIDE-25) 37.5-25 MG tablet    Sig: Take 1 tablet by mouth daily.    Dispense:  90 tablet    Refill:  1  . VICTOZA 18 MG/3ML SOPN    Sig: Inject 0.3 mLs (1.8 mg total) into the skin daily.    Dispense:  5 pen    Refill:  2  . rosuvastatin (CRESTOR) 5 MG tablet    Sig: Take 1 tablet (5 mg total) by mouth daily.    Dispense:  90 tablet    Refill:  1    Current Outpatient Prescriptions:  .  aspirin EC 81 MG tablet, Take 1 tablet (81 mg total) by mouth daily., Disp:  30 tablet, Rfl: 0 .  atenolol (TENORMIN) 25 MG tablet, Take 1 tablet (25 mg total) by mouth daily., Disp: 90 tablet, Rfl: 1 .  Blood Glucose Monitoring Suppl (ONETOUCH VERIO) w/Device KIT, 1 Device by Does not apply route once., Disp: 1 kit, Rfl: 0 .  calcium carbonate 200 MG capsule, Take 200 mg by mouth 2 (two) times daily with a meal. , Disp: , Rfl:  .  cholecalciferol (VITAMIN D) 1000 UNITS tablet, Take 1,000 Units by mouth daily., Disp: , Rfl:  .  fluticasone (FLONASE) 50 MCG/ACT nasal spray, Place 2 sprays into both nostrils daily., Disp: 16 g, Rfl: 2 .  glucose blood (ONETOUCH VERIO) test strip, Use as instructed, Disp: 100 each, Rfl: 12 .  ibuprofen (ADVIL,MOTRIN) 800 MG tablet, Take 1 tablet (800 mg total) by mouth every 8 (eight) hours as needed., Disp: 30 tablet, Rfl: 0 .  Insulin Glargine (LANTUS SOLOSTAR) 100 UNIT/ML Solostar Pen, Inject 8 Units into the skin daily at 10 pm., Disp: 5 pen, Rfl: PRN .  Lancets (ONETOUCH ULTRASOFT) lancets, Use as instructed, Disp: 100 each, Rfl: 3 .  polyethylene glycol powder (GLYCOLAX/MIRALAX) powder, Take 17 g by mouth 2 (two) times daily as needed., Disp: 3350 g, Rfl: 1 .  rosuvastatin (CRESTOR) 5 MG tablet, Take 1 tablet (5 mg total) by mouth daily., Disp: 90 tablet, Rfl: 1 .  SUMAtriptan (IMITREX) 50  MG tablet, TAKE 1 TABLET BY MOUTH EVERY 2 HOURS AS NEEDED FOR MIGRAINE-MAY REPEAT IN 2 HOURS IF HEADACHE PERSIS, Disp: 10 tablet, Rfl: 0 .  triamterene-hydrochlorothiazide (MAXZIDE-25) 37.5-25 MG tablet, Take 1 tablet by mouth daily., Disp: 90 tablet, Rfl: 1 .  VICTOZA 18 MG/3ML SOPN, Inject 0.3 mLs (1.8 mg total) into the skin daily., Disp: 5 pen, Rfl: 2 Medications Discontinued During This Encounter  Medication Reason  . atenolol (TENORMIN) 25 MG tablet Reorder  . triamterene-hydrochlorothiazide (MAXZIDE-25) 37.5-25 MG tablet Reorder  . [place] 18 MG/3ML SOPN Reorder    I have personally reviewed and addressed the Medicare Annual Wellness health risk assessment questionnaire and have noted the following in the patient's chart:  A.         Medical and social history & family history B.         Use of alcohol, tobacco, and illicit drugs  C.         Current medications and supplements D.         Functional and Cognitive ability and status E.         Nutritional status F.         Physical activity G.        Advance directives H.         List of other physicians I.          Hospitalizations, surgeries, and ER visits in previous 12 months J.         [place] such as hearing, vision, cognitive function, and depression L.         Referrals and appointments: bone density   In addition, I have reviewed and discussed with patient certain preventive protocols, quality metrics, and best practice recommendations. A written personalized care plan for preventive services as well as general preventive health recommendations were provided to patient.  See attached scanned questionnaire for additional information.   Return in about 1 month (around 10/14/2017) for Follow up DM, HTN, cancel Nov appoint. return in 4 months

## 2017-10-06 ENCOUNTER — Encounter: Payer: Self-pay | Admitting: Family Medicine

## 2017-10-06 ENCOUNTER — Ambulatory Visit (INDEPENDENT_AMBULATORY_CARE_PROVIDER_SITE_OTHER): Payer: PPO | Admitting: Family Medicine

## 2017-10-06 VITALS — BP 142/82 | HR 74 | Temp 98.2°F | Resp 16 | Ht 67.0 in | Wt 215.2 lb

## 2017-10-06 DIAGNOSIS — R350 Frequency of micturition: Secondary | ICD-10-CM

## 2017-10-06 DIAGNOSIS — R300 Dysuria: Secondary | ICD-10-CM | POA: Diagnosis not present

## 2017-10-06 LAB — POCT URINALYSIS DIPSTICK
BILIRUBIN UA: NEGATIVE
Blood, UA: NEGATIVE
Glucose, UA: NEGATIVE
KETONES UA: NEGATIVE
Nitrite, UA: NEGATIVE
PROTEIN UA: 30
SPEC GRAV UA: 1.01 (ref 1.010–1.025)
Urobilinogen, UA: 0.2 E.U./dL
pH, UA: 8 (ref 5.0–8.0)

## 2017-10-06 MED ORDER — SULFAMETHOXAZOLE-TRIMETHOPRIM 800-160 MG PO TABS: 1.0000 | ORAL_TABLET | Freq: Two times a day (BID) | ORAL | 0 refills | Status: DC

## 2017-10-06 NOTE — Progress Notes (Signed)
Name: Morgan Peterson   MRN: [id number]    DOB: 07-Dec-1951   Date:10/06/2017       Progress Note  Subjective  Chief Complaint  Chief Complaint  Patient presents with  . Urinary Frequency    Onset-2 weeks, dysuria, bladder pain, abdominal pain and back pain.    HPI  Dysuria: she developed dysuria and urinary frequency about one week ago, but could not come in because of [place] [place] [employer]. She also has mild supra pubic discomfort and low back pain, no fever, chills,  hematuria, nausea or vomiting. She denies any rash.    Patient Active Problem List   Diagnosis Date Noted  . Chronic low back pain without sciatica 08/18/2017  . Irritable bowel syndrome with constipation 05/02/2017  . Vitamin D deficiency 09/09/2016  . Hiatal hernia 06/02/2016  . Allergic rhinitis, seasonal 06/02/2016  . Adult BMI 30+ 04/30/2016  . Angio-edema 04/30/2016  . Hyperlipidemia 11/04/2015  . Cervical radiculopathy at C6 07/31/2015  . Neuroma digital nerve 07/31/2015  . Type 2 diabetes mellitus with hyperglycemia, with long-term current use of insulin ([place]) 06/30/2015  . Right shoulder pain 06/30/2015  . Cervical disc disease 06/30/2015  . Essential hypertension 06/30/2015  . History of breast cancer in female 03/23/2009    Past Surgical History:  Procedure Laterality Date  . ABDOMINAL EXPLORATION SURGERY  2000  . BREAST BIOPSY Right 2010   +   . BREAST EXCISIONAL BIOPSY Right 2010   + mammo site inasive mammo ca  . BREAST LUMPECTOMY Right 2010  . COLONOSCOPY  2003, 2013   Dr Alveta Heimlich, Dr. Bary Castilla  . TUBAL LIGATION  20 years ago    Family History  Problem Relation Age of Onset  . Hypertension Mother   . Hyperlipidemia Mother   . Diabetes Father   . Kidney disease Father   . Diabetes Brother   . Colon cancer Maternal Uncle   . Breast cancer Maternal Aunt     Social History   Social History  . Marital status: Married    Spouse name: N/A  . Number of children: N/A  . Years of  education: N/A   Occupational History  . Not on file.   Social History Main Topics  . Smoking status: Former Smoker    Packs/day: 0.25    Years: 10.00    Types: Cigarettes  . Smokeless tobacco: Never Used  . Alcohol use 0.0 oz/week  . Drug use: No  . Sexual activity: Not Currently     Comment: husband has ED   Other Topics Concern  . Not on file   Social History Narrative  . No narrative on file     Current Outpatient Prescriptions:  .  aspirin EC 81 MG tablet, Take 1 tablet (81 mg total) by mouth daily., Disp: 30 tablet, Rfl: 0 .  atenolol (TENORMIN) 25 MG tablet, Take 1 tablet (25 mg total) by mouth daily., Disp: 90 tablet, Rfl: 1 .  Blood Glucose Monitoring Suppl (ONETOUCH VERIO) w/Device KIT, 1 Device by Does not apply route once., Disp: 1 kit, Rfl: 0 .  calcium carbonate 200 MG capsule, Take 200 mg by mouth 2 (two) times daily with a meal. , Disp: , Rfl:  .  cholecalciferol (VITAMIN D) 1000 UNITS tablet, Take 1,000 Units by mouth daily., Disp: , Rfl:  .  Ferrous Sulfate (IRON) 325 (65 Fe) MG TABS, Take 1 tablet by mouth daily., Disp: , Rfl:  .  fluticasone (FLONASE) 50 MCG/ACT nasal spray,  Place 2 sprays into both nostrils daily., Disp: 16 g, Rfl: 2 .  glucose blood (ONETOUCH VERIO) test strip, Use as instructed, Disp: 100 each, Rfl: 12 .  ibuprofen (ADVIL,MOTRIN) 800 MG tablet, Take 1 tablet (800 mg total) by mouth every 8 (eight) hours as needed., Disp: 30 tablet, Rfl: 0 .  Insulin Glargine (LANTUS SOLOSTAR) 100 UNIT/ML Solostar Pen, Inject 8 Units into the skin daily at 10 pm., Disp: 5 pen, Rfl: PRN .  Lancets (ONETOUCH ULTRASOFT) lancets, Use as instructed, Disp: 100 each, Rfl: 3 .  polyethylene glycol powder (GLYCOLAX/MIRALAX) powder, Take 17 g by mouth 2 (two) times daily as needed., Disp: 3350 g, Rfl: 1 .  rosuvastatin (CRESTOR) 5 MG tablet, Take 1 tablet (5 mg total) by mouth daily., Disp: 90 tablet, Rfl: 1 .  SUMAtriptan (IMITREX) 50 MG tablet, TAKE 1 TABLET BY  MOUTH EVERY 2 HOURS AS NEEDED FOR MIGRAINE-MAY REPEAT IN 2 HOURS IF HEADACHE PERSIS, Disp: 10 tablet, Rfl: 0 .  triamterene-hydrochlorothiazide (MAXZIDE-25) 37.5-25 MG tablet, Take 1 tablet by mouth daily., Disp: 90 tablet, Rfl: 1 .  VICTOZA 18 MG/3ML SOPN, Inject 0.3 mLs (1.8 mg total) into the skin daily., Disp: 5 pen, Rfl: 2 .  vitamin B-12 (CYANOCOBALAMIN) 1000 MCG tablet, Take 1 tablet by mouth 2 (two) times a week., Disp: , Rfl:  .  sulfamethoxazole-trimethoprim (BACTRIM DS) 800-160 MG tablet, Take 1 tablet by mouth 2 (two) times daily., Disp: 10 tablet, Rfl: 0  Allergies  Allergen Reactions  . Levaquin [Levofloxacin In D5w] Swelling    Other reaction(s): Arthralgia (Joint Pain)  . Losartan     Other reaction(s): Angioedema  . Amlodipine-Olmesartan   . Codeine   . Gabapentin     cns side effects.  . Glipizide     Other reaction(s): Abdominal pain  . Invokana [Canagliflozin] Other (See Comments)    Bladder pain  . Lisinopril Swelling    Angioedema  . Metformin And Related Nausea Only  . Onglyza [Saxagliptin] Other (See Comments)    Increased PVC's  . Tramadol Nausea And Vomiting  . Actos [Pioglitazone] Other (See Comments) and Nausea And Vomiting    Bladder pain  . Atorvastatin Other (See Comments)  . Carvedilol Other (See Comments)    NUMBNESS, TINGLING, ACHING IN ARMS  . Prednisone Palpitations     ROS  Ten systems reviewed and is negative except as mentioned in HPI   Objective  Vitals:   10/06/17 1110  BP: (!) 142/82  Pulse: 74  Resp: 16  Temp: 98.2 F (36.8 C)  TempSrc: Oral  SpO2: 97%  Weight: 215 lb 3.2 oz (97.6 kg)  Height: '5\' 7"'  (1.702 m)    Body mass index is 33.71 kg/m.  Physical Exam  Constitutional: Patient appears well-developed and well-nourished. Obese  No distress.  HEENT: head atraumatic, normocephalic, pupils equal and reactive to light, , neck supple, throat within normal limits Cardiovascular: Normal rate, regular rhythm and normal  heart sounds.  No murmur heard. No BLE edema. Pulmonary/Chest: Effort normal and breath sounds normal. No respiratory distress. Abdominal: Soft.  There is no tenderness. Normal bowel sounds, negative CVA tenderness  Psychiatric: Patient has a normal mood and affect. behavior is normal. Judgment and thought content normal.  Recent Results (from the past 2160 hour(s))  Comprehensive metabolic panel     Status: None   Collection Time: 09/13/17 10:25 AM  Result Value Ref Range   Glucose 86 65 - 99 mg/dL   BUN 15 8 -  27 mg/dL   Creatinine, Ser 0.82 0.57 - 1.00 mg/dL   GFR calc non Af Amer 75 >59 mL/min/1.73   GFR calc Af Amer 87 >59 mL/min/1.73   BUN/Creatinine Ratio 18 12 - 28   Sodium 142 134 - 144 mmol/L   Potassium 4.1 3.5 - 5.2 mmol/L   Chloride 102 96 - 106 mmol/L   CO2 28 20 - 29 mmol/L   Calcium 9.4 8.7 - 10.3 mg/dL   Total Protein 7.0 6.0 - 8.5 g/dL   Albumin 3.9 3.6 - 4.8 g/dL   Globulin, Total 3.1 1.5 - 4.5 g/dL   Albumin/Globulin Ratio 1.3 1.2 - 2.2   Bilirubin Total 0.4 0.0 - 1.2 mg/dL   Alkaline Phosphatase 112 39 - 117 IU/L   AST 18 0 - 40 IU/L   ALT 11 0 - 32 IU/L  Lipid Panel With LDL/HDL Ratio     Status: Abnormal   Collection Time: 09/13/17 10:25 AM  Result Value Ref Range   Cholesterol, Total 193 100 - 199 mg/dL   Triglycerides 70 0 - 149 mg/dL   HDL 49 >39 mg/dL   VLDL Cholesterol Cal 14 5 - 40 mg/dL   LDL Calculated 130 (H) 0 - 99 mg/dL   LDl/HDL Ratio 2.7 0.0 - 3.2 ratio    Comment:                                     LDL/HDL Ratio                                             Men  Women                               1/2 Avg.Risk  1.0    1.5                                   Avg.Risk  3.6    3.2                                2X Avg.Risk  6.2    5.0                                3X Avg.Risk  8.0    6.1   Hgb A1c w/o eAG     Status: Abnormal   Collection Time: 09/13/17 10:25 AM  Result Value Ref Range   Hgb A1c MFr Bld 6.4 (H) 4.8 - 5.6 %    Comment:           Prediabetes: 5.7 - 6.4          Diabetes: >6.4          Glycemic control for adults with diabetes: <7.0   POCT urinalysis dipstick     Status: Abnormal   Collection Time: 10/06/17 11:20 AM  Result Value Ref Range   Color, UA yellow    Clarity, UA clear    Glucose, UA neg    Bilirubin, UA neg    Ketones, UA  neg    Spec Grav, UA 1.010 1.010 - 1.025   Blood, UA neg    pH, UA 8.0 5.0 - 8.0   Protein, UA 30    Urobilinogen, UA 0.2 0.2 or 1.0 E.U./dL   Nitrite, UA neg    Leukocytes, UA Moderate (2+) (A) Negative    PHQ2/9: Depression screen [facility] 2/9 09/14/2017 06/17/2017 03/23/2017 10/28/2016 09/03/2016  Decreased Interest 0 0 0 0 0  Down, Depressed, Hopeless 0 0 0 0 0  PHQ - 2 Score 0 0 0 0 0     Fall Risk: Fall Risk  09/14/2017 06/17/2017 03/23/2017 10/28/2016 09/03/2016  Falls in the past year? No No No No No     Assessment & Plan  1. Dysuria  - POCT urinalysis dipstick - Urine Culture - sulfamethoxazole-trimethoprim (BACTRIM DS) 800-160 MG tablet; Take 1 tablet by mouth 2 (two) times daily.  Dispense: 10 tablet; Refill: 0  2. Urine frequency  - POCT urinalysis dipstick - Urine Culture - sulfamethoxazole-trimethoprim (BACTRIM DS) 800-160 MG tablet; Take 1 tablet by mouth 2 (two) times daily.  Dispense: 10 tablet; Refill: 0

## 2017-10-07 LAB — URINE CULTURE
MICRO NUMBER:: 81166089
SPECIMEN QUALITY:: ADEQUATE

## 2017-11-03 ENCOUNTER — Ambulatory Visit: Payer: PPO | Admitting: Family Medicine

## 2017-11-29 ENCOUNTER — Ambulatory Visit: Payer: PPO | Admitting: Family Medicine

## 2017-12-01 ENCOUNTER — Other Ambulatory Visit: Payer: Self-pay

## 2017-12-01 DIAGNOSIS — Z1231 Encounter for screening mammogram for malignant neoplasm of breast: Secondary | ICD-10-CM

## 2017-12-03 ENCOUNTER — Ambulatory Visit (INDEPENDENT_AMBULATORY_CARE_PROVIDER_SITE_OTHER): Payer: PPO | Admitting: Family Medicine

## 2017-12-03 ENCOUNTER — Encounter: Payer: Self-pay | Admitting: Family Medicine

## 2017-12-03 VITALS — BP 132/82 | HR 67 | Temp 98.6°F | Ht 66.0 in | Wt 206.3 lb

## 2017-12-03 DIAGNOSIS — M5442 Lumbago with sciatica, left side: Secondary | ICD-10-CM | POA: Diagnosis not present

## 2017-12-03 DIAGNOSIS — G8929 Other chronic pain: Secondary | ICD-10-CM

## 2017-12-03 MED ORDER — TIZANIDINE HCL 4 MG PO TABS: 4.0000 mg | ORAL_TABLET | Freq: Three times a day (TID) | ORAL | 0 refills | Status: DC | PRN

## 2017-12-03 MED ORDER — IBUPROFEN 800 MG PO TABS: 800.0000 mg | ORAL_TABLET | Freq: Three times a day (TID) | ORAL | 0 refills | Status: DC | PRN

## 2017-12-03 NOTE — Progress Notes (Signed)
Name: Morgan Peterson   MRN: [id number]    DOB: [birth year]   Date:12/03/2017       Progress Note  Subjective  Chief Complaint  Chief Complaint  Patient presents with  . Medication Refill    Needs BP meds   . Sciatica    Left worse than right, used to take 872m Motrin and muscle relaxer     HPI  Sciatica: PT reports progressively worsening flare of Sciatic nerve pain, worse on the left, for about 3-4 months.  She has been working with a pPhysiological scientistand has been able to continue to exercise. Notes pain has really worsened over the last month after breaking a patient's fall at work.  Endorses pain radiation into the posterior LLE that stops just above the posterior knee. She has tried PT in the past - helped a little bit.  Has never seen a chiropractor or orthopedist.  She applies heat PRN with some mild relief of pain; used to take ibuprofen 8015mand flexeril (makes her very sleepy).  Denies numbness/tingling, no LE weakness.  Patient Active Problem List   Diagnosis Date Noted  . Chronic low back pain without sciatica 08/18/2017  . Irritable bowel syndrome with constipation 05/02/2017  . Vitamin D deficiency 09/09/2016  . Hiatal hernia 06/02/2016  . Allergic rhinitis, seasonal 06/02/2016  . Adult BMI 30+ 04/30/2016  . Angio-edema 04/30/2016  . Hyperlipidemia 11/04/2015  . Cervical radiculopathy at C6 07/31/2015  . Neuroma digital nerve 07/31/2015  . Type 2 diabetes mellitus with hyperglycemia, with long-term current use of insulin ([place]07/10/2015  . Right shoulder pain 06/30/2015  . Cervical disc disease 06/30/2015  . Essential hypertension 06/30/2015  . History of breast cancer in female 03/23/2009    Past Surgical History:  Procedure Laterality Date  . ABDOMINAL EXPLORATION SURGERY  2000  . BREAST BIOPSY Right 2010   +   . BREAST EXCISIONAL BIOPSY Right 2010   + mammo site inasive mammo ca  . BREAST LUMPECTOMY Right 2010  . COLONOSCOPY  2003, 2013   Dr WaAlveta HeimlichDr.  ByBary Castilla. TUBAL LIGATION  20 years ago    Family History  Problem Relation Age of Onset  . Hypertension Mother   . Hyperlipidemia Mother   . Diabetes Father   . Kidney disease Father   . Diabetes Brother   . Colon cancer Maternal Uncle   . Breast cancer Maternal Aunt     Social History   Socioeconomic History  . Marital status: Married    Spouse name: Not on file  . Number of children: Not on file  . Years of education: Not on file  . Highest education level: Not on file  Social Needs  . Financial resource strain: Not on file  . Food insecurity - worry: Not on file  . Food insecurity - inability: Not on file  . Transportation needs - medical: Not on file  . Transportation needs - non-medical: Not on file  Occupational History  . Not on file  Tobacco Use  . Smoking status: Former Smoker    Packs/day: 0.25    Years: 10.00    Pack years: 2.50    Types: Cigarettes  . Smokeless tobacco: Never Used  Substance and Sexual Activity  . Alcohol use: Yes    Alcohol/week: 0.0 oz  . Drug use: No  . Sexual activity: Not Currently    Comment: husband has ED  Other Topics Concern  . Not on file  Social  History Narrative  . Not on file     Current Outpatient Medications:  .  aspirin EC 81 MG tablet, Take 1 tablet (81 mg total) by mouth daily., Disp: 30 tablet, Rfl: 0 .  atenolol (TENORMIN) 25 MG tablet, Take 1 tablet (25 mg total) by mouth daily., Disp: 90 tablet, Rfl: 1 .  Blood Glucose Monitoring Suppl (ONETOUCH VERIO) w/Device KIT, 1 Device by Does not apply route once., Disp: 1 kit, Rfl: 0 .  calcium carbonate 200 MG capsule, Take 200 mg by mouth 2 (two) times daily with a meal. , Disp: , Rfl:  .  cholecalciferol (VITAMIN D) 1000 UNITS tablet, Take 1,000 Units by mouth daily., Disp: , Rfl:  .  Ferrous Sulfate (IRON) 325 (65 Fe) MG TABS, Take 1 tablet by mouth daily., Disp: , Rfl:  .  fluticasone (FLONASE) 50 MCG/ACT nasal spray, Place 2 sprays into both nostrils daily.,  Disp: 16 g, Rfl: 2 .  glucose blood (ONETOUCH VERIO) test strip, Use as instructed, Disp: 100 each, Rfl: 12 .  ibuprofen (ADVIL,MOTRIN) 800 MG tablet, Take 1 tablet (800 mg total) by mouth every 8 (eight) hours as needed., Disp: 30 tablet, Rfl: 0 .  Insulin Glargine (LANTUS SOLOSTAR) 100 UNIT/ML Solostar Pen, Inject 8 Units into the skin daily at 10 pm., Disp: 5 pen, Rfl: PRN .  Insulin Pen Needle (FIFTY50 PEN NEEDLES) 31G X 8 MM MISC, Use as directed., Disp: , Rfl:  .  Lancets (ONETOUCH ULTRASOFT) lancets, Use as instructed, Disp: 100 each, Rfl: 3 .  lovastatin (MEVACOR) 40 MG tablet, Take by mouth., Disp: , Rfl:  .  polyethylene glycol powder (GLYCOLAX/MIRALAX) powder, Take 17 g by mouth 2 (two) times daily as needed., Disp: 3350 g, Rfl: 1 .  rosuvastatin (CRESTOR) 5 MG tablet, Take 1 tablet (5 mg total) by mouth daily., Disp: 90 tablet, Rfl: 1 .  sulfamethoxazole-trimethoprim (BACTRIM DS) 800-160 MG tablet, Take 1 tablet by mouth 2 (two) times daily., Disp: 10 tablet, Rfl: 0 .  SUMAtriptan (IMITREX) 50 MG tablet, TAKE 1 TABLET BY MOUTH EVERY 2 HOURS AS NEEDED FOR MIGRAINE-MAY REPEAT IN 2 HOURS IF HEADACHE PERSIS, Disp: 10 tablet, Rfl: 0 .  triamterene-hydrochlorothiazide (MAXZIDE-25) 37.5-25 MG tablet, Take 1 tablet by mouth daily., Disp: 90 tablet, Rfl: 1 .  VICTOZA 18 MG/3ML SOPN, Inject 0.3 mLs (1.8 mg total) into the skin daily., Disp: 5 pen, Rfl: 2 .  vitamin B-12 (CYANOCOBALAMIN) 1000 MCG tablet, Take 1 tablet by mouth 2 (two) times a week., Disp: , Rfl:   Allergies  Allergen Reactions  . Levaquin [Levofloxacin In D5w] Swelling    Other reaction(s): Arthralgia (Joint Pain)  . Losartan     Other reaction(s): Angioedema  . Amlodipine-Olmesartan   . Codeine   . Gabapentin     cns side effects.  . Glipizide     Other reaction(s): Abdominal pain  . Invokana [Canagliflozin] Other (See Comments)    Bladder pain  . Lisinopril Swelling    Angioedema  . Metformin And Related Nausea Only   . Onglyza [Saxagliptin] Other (See Comments)    Increased PVC's  . Tramadol Nausea And Vomiting  . Actos [Pioglitazone] Other (See Comments) and Nausea And Vomiting    Bladder pain  . Atorvastatin Other (See Comments)  . Carvedilol Other (See Comments)    NUMBNESS, TINGLING, ACHING IN ARMS  . Prednisone Palpitations     ROS Ten systems reviewed and is negative except as mentioned in HPI  Objective  Vitals:   12/03/17 1053  BP: 132/82  Pulse: 67  Temp: 98.6 F (37 C)  TempSrc: Oral  SpO2: 94%  Weight: 206 lb 4.8 oz (93.6 kg)  Height: _0  (1.676 m)   Body mass index is 33.3 kg/m.  Physical Exam Constitutional: Patient appears well-developed and well-nourished. No distress.  HENT: Head: Normocephalic and atraumatic.  Neck: Normal range of motion. Neck supple. No JVD present. No thyromegaly present.  Cardiovascular: Normal rate, regular rhythm and normal heart sounds.  No murmur heard. No BLE edema. Pulmonary/Chest: Effort normal and breath sounds normal. No respiratory distress. Musculoskeletal: Normal range of motion, no joint effusions. No gross deformities. Limited spinal flexion, otherwise full spinal and hip AROM. No bony tenderness. Neurological: she is alert and oriented to person, place, and time. No cranial nerve deficit. Coordination, balance, strength, speech and gait are normal.  Skin: Skin is warm and dry. No rash noted. No erythema.  Psychiatric: Patient has a normal mood and affect. behavior is normal. Judgment and thought content normal.  Recent Results (from the past 2160 hour(s))  Comprehensive metabolic panel     Status: None   Collection Time: 09/13/17 10:25 AM  Result Value Ref Range   Glucose 86 65 - 99 mg/dL   BUN 15 8 - 27 mg/dL   Creatinine, Ser 0.82 0.57 - 1.00 mg/dL   GFR calc non Af Amer 75 >59 mL/min/1.73   GFR calc Af Amer 87 >59 mL/min/1.73   BUN/Creatinine Ratio 18 12 - 28   Sodium 142 134 - 144 mmol/L   Potassium 4.1 3.5 - 5.2  mmol/L   Chloride 102 96 - 106 mmol/L   CO2 28 20 - 29 mmol/L   Calcium 9.4 8.7 - 10.3 mg/dL   Total Protein 7.0 6.0 - 8.5 g/dL   Albumin 3.9 3.6 - 4.8 g/dL   Globulin, Total 3.1 1.5 - 4.5 g/dL   Albumin/Globulin Ratio 1.3 1.2 - 2.2   Bilirubin Total 0.4 0.0 - 1.2 mg/dL   Alkaline Phosphatase 112 39 - 117 IU/L   AST 18 0 - 40 IU/L   ALT 11 0 - 32 IU/L  Lipid Panel With LDL/HDL Ratio     Status: Abnormal   Collection Time: 09/13/17 10:25 AM  Result Value Ref Range   Cholesterol, Total 193 100 - 199 mg/dL   Triglycerides 70 0 - 149 mg/dL   HDL 49 >39 mg/dL   VLDL Cholesterol Cal 14 5 - 40 mg/dL   LDL Calculated 130 (H) 0 - 99 mg/dL   LDl/HDL Ratio 2.7 0.0 - 3.2 ratio    Comment:                                     LDL/HDL Ratio                                             Men  Women                               1/2 Avg.Risk  1.0    1.5  Avg.Risk  3.6    3.2                                2X Avg.Risk  6.2    5.0                                3X Avg.Risk  8.0    6.1   Hgb A1c w/o eAG     Status: Abnormal   Collection Time: 09/13/17 10:25 AM  Result Value Ref Range   Hgb A1c MFr Bld 6.4 (H) 4.8 - 5.6 %    Comment:          Prediabetes: 5.7 - 6.4          Diabetes: >6.4          Glycemic control for adults with diabetes: <7.0   POCT urinalysis dipstick     Status: Abnormal   Collection Time: 10/06/17 11:20 AM  Result Value Ref Range   Color, UA yellow    Clarity, UA clear    Glucose, UA neg    Bilirubin, UA neg    Ketones, UA neg    Spec Grav, UA 1.010 1.010 - 1.025   Blood, UA neg    pH, UA 8.0 5.0 - 8.0   Protein, UA 30    Urobilinogen, UA 0.2 0.2 or 1.0 E.U./dL   Nitrite, UA neg    Leukocytes, UA Moderate (2+) (A) Negative  Urine Culture     Status: None   Collection Time: 10/06/17  2:32 PM  Result Value Ref Range   MICRO NUMBER: [id number]    SPECIMEN QUALITY: ADEQUATE    Sample Source URINE    STATUS: FINAL    Result:       Multiple organisms present, each less than 10,000 CFU/mL. These organisms, commonly found on external and internal genitalia, are considered to be colonizers. No further testing performed.   Assessment & Plan  1. Chronic bilateral low back pain with left-sided sciatica - Gentle stretching as tolerated, consider chiropractor visit for evaluation and treatment. - tiZANidine (ZANAFLEX) 4 MG tablet; Take 1 tablet (4 mg total) by mouth every 8 (eight) hours as needed for muscle spasms.  Dispense: 30 tablet; Refill: 0 - We will try zanaflex insterad of flexeril to try to avoid significant drowsiness, discussed SE of drowsiness and recommend taking medication while at home and to avoid operating machinery including a car or stove/oven while taking medication. - ibuprofen (ADVIL,MOTRIN) 800 MG tablet; Take 1 tablet (800 mg total) by mouth every 8 (eight) hours as needed.  Dispense: 30 tablet; Refill: 0

## 2017-12-03 NOTE — Patient Instructions (Addendum)

## 2017-12-05 ENCOUNTER — Ambulatory Visit: Payer: PPO | Admitting: Family Medicine

## 2017-12-08 ENCOUNTER — Ambulatory Visit: Payer: PPO | Admitting: Family Medicine

## 2017-12-21 ENCOUNTER — Encounter: Payer: Self-pay | Admitting: Family Medicine

## 2017-12-21 ENCOUNTER — Ambulatory Visit: Payer: PPO | Admitting: Family Medicine

## 2017-12-21 VITALS — BP 132/82 | HR 70 | Temp 98.9°F | Resp 16 | Wt 208.4 lb

## 2017-12-21 DIAGNOSIS — R509 Fever, unspecified: Secondary | ICD-10-CM | POA: Diagnosis not present

## 2017-12-21 DIAGNOSIS — K449 Diaphragmatic hernia without obstruction or gangrene: Secondary | ICD-10-CM

## 2017-12-21 DIAGNOSIS — R1013 Epigastric pain: Secondary | ICD-10-CM | POA: Diagnosis not present

## 2017-12-21 DIAGNOSIS — J988 Other specified respiratory disorders: Secondary | ICD-10-CM | POA: Diagnosis not present

## 2017-12-21 LAB — POCT INFLUENZA A/B
Influenza A, POC: NEGATIVE
Influenza B, POC: NEGATIVE

## 2017-12-21 MED ORDER — PROMETHAZINE-DM 6.25-15 MG/5ML PO SYRP: 5.0000 mL | ORAL_SOLUTION | Freq: Four times a day (QID) | ORAL | 0 refills | Status: DC | PRN

## 2017-12-21 MED ORDER — RANITIDINE HCL 150 MG PO TABS: 150.0000 mg | ORAL_TABLET | Freq: Two times a day (BID) | ORAL | 1 refills | Status: DC

## 2017-12-21 MED ORDER — BUDESONIDE-FORMOTEROL FUMARATE 80-4.5 MCG/ACT IN AERO: 2.0000 | INHALATION_SPRAY | Freq: Two times a day (BID) | RESPIRATORY_TRACT | 1 refills | Status: DC

## 2017-12-21 NOTE — Patient Instructions (Addendum)
Upper Respiratory Infection, Adult Most upper respiratory infections (URIs) are caused by a virus. A URI affects the nose, throat, and upper air passages. The most common type of URI is often called "the common cold." Follow these instructions at home:  Take medicines only as told by your doctor.  Gargle warm saltwater or take cough drops to comfort your throat as told by your doctor.  Use a warm mist humidifier or inhale steam from a shower to increase air moisture. This may make it easier to breathe.  Drink enough fluid to keep your pee (urine) clear or pale yellow.  Eat soups and other clear broths.  Have a healthy diet.  Rest as needed.  Go back to work when your fever is gone or your doctor says it is okay. ? You may need to stay home longer to avoid giving your URI to others. ? You can also wear a face mask and wash your hands often to prevent spread of the virus.  Use your inhaler more if you have asthma.  Do not use any tobacco products, including cigarettes, chewing tobacco, or electronic cigarettes. If you need help quitting, ask your doctor. Contact a doctor if:  You are getting worse, not better.  Your symptoms are not helped by medicine.  You have chills.  You are getting more short of breath.  You have brown or red mucus.  You have yellow or brown discharge from your nose.  You have pain in your face, especially when you bend forward.  You have a fever.  You have puffy (swollen) neck glands.  You have pain while swallowing.  You have white areas in the back of your throat. Get help right away if:  You have very bad or constant: ? Headache. ? Ear pain. ? Pain in your forehead, behind your eyes, and over your cheekbones (sinus pain). ? Chest pain.  You have long-lasting (chronic) lung disease and any of the following: ? Wheezing. ? Long-lasting cough. ? Coughing up blood. ? A change in your usual mucus.  You have a stiff neck.  You have  changes in your: ? Vision. ? Hearing. ? Thinking. ? Mood. This information is not intended to replace advice given to you by your health care provider. Make sure you discuss any questions you have with your health care provider. Document Released: 05/24/2008 Document Revised: 08/08/2016 Document Reviewed: 03/13/2014 Elsevier Interactive Patient Education  2018 [employer]  Cool Mist Vaporizer A cool mist vaporizer is a device that releases a cool mist into the air. If you have a cough or a cold, using a vaporizer may help relieve your symptoms. The mist adds moisture to the air, which may help thin your mucus and make it less sticky. When your mucus is thin and less sticky, it easier for you to breathe and to cough up secretions. Do not use a vaporizer if you are allergic to mold. Follow these instructions at home:  Follow the instructions that come with the vaporizer.  Do not use anything other than distilled water in the vaporizer.  Do not run the vaporizer all of the time. Doing that can cause mold or bacteria to grow in the vaporizer.  Clean the vaporizer after each time that you use it.  Clean and dry the vaporizer well before storing it.  Stop using the vaporizer if your breathing symptoms get worse. This information is not intended to replace advice given to you by your health care provider. Make sure   you discuss any questions you have with your health care provider. Document Released: 09/02/2004 Document Revised: 06/25/2016 Document Reviewed: 03/06/2016 Elsevier Interactive Patient Education  2018 [place] for Gastroesophageal Reflux Disease, Adult When you have gastroesophageal reflux disease (GERD), the foods you eat and your eating habits are very important. Choosing the right foods can help ease your discomfort. What guidelines do I need to follow?  Choose fruits, vegetables, whole grains, and low-fat dairy products.  Choose low-fat meat, fish,  and poultry.  Limit fats such as oils, salad dressings, butter, nuts, and avocado.  Keep a food diary. This helps you identify foods that cause symptoms.  Avoid foods that cause symptoms. These may be different for everyone.  Eat small meals often instead of 3 large meals a day.  Eat your meals slowly, in a place where you are relaxed.  Limit fried foods.  Cook foods using methods other than frying.  Avoid drinking alcohol.  Avoid drinking large amounts of liquids with your meals.  Avoid bending over or lying down until 2-3 hours after eating. What foods are not recommended? These are some foods and drinks that may make your symptoms worse: Vegetables Tomatoes. Tomato juice. Tomato and spaghetti sauce. Chili peppers. Onion and garlic. Horseradish. Fruits Oranges, grapefruit, and lemon (fruit and juice). Meats High-fat meats, fish, and poultry. This includes hot dogs, ribs, ham, sausage, salami, and bacon. Dairy Whole milk and chocolate milk. Sour cream. Cream. Butter. Ice cream. Cream cheese. Drinks Coffee and tea. Bubbly (carbonated) drinks or energy drinks. Condiments Hot sauce. Barbecue sauce. Sweets/Desserts Chocolate and cocoa. Donuts. Peppermint and spearmint. Fats and Oils High-fat foods. This includes [place] fries and potato chips. Other Vinegar. Strong spices. This includes black pepper, white pepper, red pepper, cayenne, curry powder, cloves, ginger, and chili powder. The items listed above may not be a complete list of foods and drinks to avoid. Contact your dietitian for more information. This information is not intended to replace advice given to you by your health care provider. Make sure you discuss any questions you have with your health care provider. Document Released: 06/06/2012 Document Revised: 05/13/2016 Document Reviewed: 10/10/2013 Elsevier Interactive Patient Education  2017 [employer].

## 2017-12-21 NOTE — Progress Notes (Addendum)
Name: Morgan Peterson   MRN: [id number]    DOB: 10-22-1951   Date:12/21/2017       Progress Note  Subjective  Chief Complaint  Chief Complaint  Patient presents with  . URI    onset monday symptoms include: heaache, chills, fever, left ear pain and cough  . Abdominal Pain    burning in mid somach with nausea    HPI  Pt presents with 2 days of chills, LEFT ear pain, sneezing, dry cough, headache, chills, body aches. Notes fever of 101F yesterday only.  Endorses fatigue, decreased appetite, nasal congestion.  Denies wheezing, chest pain, shortness of breath.  She also notes gnawing/burning sensation in central abdomen with radiation to the RUQ.  She states she has very poor eating habits.  She says she can't tell if the pain is still there because her above described illness has taken precedent. Pt has had hiatal hernia and IBS for many years.  She used to take Prilosec and Zantac for reflux and these used to work well for her.  Has not taken tums is several months.  Does take ibuprofen every now and then. Denies blood in stool, dark and tarry stools, vomiting.  Endorses occasional nausea related to above illness.  Patient Active Problem List   Diagnosis Date Noted  . Chronic low back pain without sciatica 08/18/2017  . Irritable bowel syndrome with constipation 05/02/2017  . Vitamin D deficiency 09/09/2016  . Hiatal hernia 06/02/2016  . Allergic rhinitis, seasonal 06/02/2016  . Adult BMI 30+ 04/30/2016  . Angio-edema 04/30/2016  . Hyperlipidemia 11/04/2015  . Cervical radiculopathy at C6 07/31/2015  . Neuroma digital nerve 07/31/2015  . Type 2 diabetes mellitus with hyperglycemia, with long-term current use of insulin ([place]) 06/30/2015  . Right shoulder pain 06/30/2015  . Cervical disc disease 06/30/2015  . Essential hypertension 06/30/2015  . History of breast cancer in female 03/23/2009    Social History   Tobacco Use  . Smoking status: Former Smoker    Packs/day: 0.25   Years: 10.00    Pack years: 2.50    Types: Cigarettes  . Smokeless tobacco: Never Used  Substance Use Topics  . Alcohol use: Yes    Alcohol/week: 0.0 oz    Comment: wine     Current Outpatient Medications:  .  aspirin EC 81 MG tablet, Take 1 tablet (81 mg total) by mouth daily., Disp: 30 tablet, Rfl: 0 .  atenolol (TENORMIN) 25 MG tablet, Take 1 tablet (25 mg total) by mouth daily., Disp: 90 tablet, Rfl: 1 .  calcium carbonate 200 MG capsule, Take 200 mg by mouth 2 (two) times daily with a meal. , Disp: , Rfl:  .  cholecalciferol (VITAMIN D) 1000 UNITS tablet, Take 1,000 Units by mouth daily., Disp: , Rfl:  .  Ferrous Sulfate (IRON) 325 (65 Fe) MG TABS, Take 1 tablet by mouth daily., Disp: , Rfl:  .  fluticasone (FLONASE) 50 MCG/ACT nasal spray, Place 2 sprays into both nostrils daily., Disp: 16 g, Rfl: 2 .  ibuprofen (ADVIL,MOTRIN) 800 MG tablet, Take 1 tablet (800 mg total) by mouth every 8 (eight) hours as needed., Disp: 30 tablet, Rfl: 0 .  Insulin Glargine (LANTUS SOLOSTAR) 100 UNIT/ML Solostar Pen, Inject 8 Units into the skin daily at 10 pm., Disp: 5 pen, Rfl: PRN .  polyethylene glycol powder (GLYCOLAX/MIRALAX) powder, Take 17 g by mouth 2 (two) times daily as needed., Disp: 3350 g, Rfl: 1 .  rosuvastatin (CRESTOR) 5 MG  tablet, Take 1 tablet (5 mg total) by mouth daily., Disp: 90 tablet, Rfl: 1 .  SUMAtriptan (IMITREX) 50 MG tablet, TAKE 1 TABLET BY MOUTH EVERY 2 HOURS AS NEEDED FOR MIGRAINE-MAY REPEAT IN 2 HOURS IF HEADACHE PERSIS, Disp: 10 tablet, Rfl: 0 .  tiZANidine (ZANAFLEX) 4 MG tablet, Take 1 tablet (4 mg total) by mouth every 8 (eight) hours as needed for muscle spasms. (Patient taking differently: Take 2 mg by mouth every 8 (eight) hours as needed for muscle spasms. ), Disp: 30 tablet, Rfl: 0 .  triamterene-hydrochlorothiazide (MAXZIDE-25) 37.5-25 MG tablet, Take 1 tablet by mouth daily., Disp: 90 tablet, Rfl: 1 .  vitamin B-12 (CYANOCOBALAMIN) 1000 MCG tablet, Take 1  tablet by mouth 2 (two) times a week., Disp: , Rfl:  .  Blood Glucose Monitoring Suppl (ONETOUCH VERIO) w/Device KIT, 1 Device by Does not apply route once., Disp: 1 kit, Rfl: 0 .  budesonide-formoterol (SYMBICORT) 80-4.5 MCG/ACT inhaler, Inhale 2 puffs into the lungs 2 (two) times daily., Disp: 1 Inhaler, Rfl: 1 .  glucose blood (ONETOUCH VERIO) test strip, Use as instructed, Disp: 100 each, Rfl: 12 .  Insulin Pen Needle (FIFTY50 PEN NEEDLES) 31G X 8 MM MISC, Use as directed., Disp: , Rfl:  .  Lancets (ONETOUCH ULTRASOFT) lancets, Use as instructed, Disp: 100 each, Rfl: 3 .  promethazine-dextromethorphan (PROMETHAZINE-DM) 6.25-15 MG/5ML syrup, Take 5 mLs by mouth 4 (four) times daily as needed for cough., Disp: 180 mL, Rfl: 0 .  ranitidine (ZANTAC) 150 MG tablet, Take 1 tablet (150 mg total) by mouth 2 (two) times daily., Disp: 60 tablet, Rfl: 1 .  VICTOZA 18 MG/3ML SOPN, Inject 0.3 mLs (1.8 mg total) into the skin daily. (Patient taking differently: Inject 1.2 mg into the skin daily. ), Disp: 5 pen, Rfl: 2  Allergies  Allergen Reactions  . Levaquin [Levofloxacin In D5w] Swelling    Other reaction(s): Arthralgia (Joint Pain)  . Losartan     Other reaction(s): Angioedema  . Amlodipine-Olmesartan   . Codeine   . Gabapentin     cns side effects.  . Glipizide     Other reaction(s): Abdominal pain  . Invokana [Canagliflozin] Other (See Comments)    Bladder pain  . Lisinopril Swelling    Angioedema  . Metformin And Related Nausea Only  . Onglyza [Saxagliptin] Other (See Comments)    Increased PVC's  . Tramadol Nausea And Vomiting  . Actos [Pioglitazone] Other (See Comments) and Nausea And Vomiting    Bladder pain  . Atorvastatin Other (See Comments)  . Carvedilol Other (See Comments)    NUMBNESS, TINGLING, ACHING IN ARMS  . Prednisone Palpitations    ROS  Ten systems reviewed and is negative except as mentioned in HPI  Objective  Vitals:   12/21/17 0919  BP: 132/82  Pulse:  70  Resp: 16  Temp: 98.9 F (37.2 C)  TempSrc: Oral  SpO2: 98%  Weight: 208 lb 6.4 oz (94.5 kg)   Body mass index is 33.64 kg/m.  Nursing Note and Vital Signs reviewed.  Physical Exam  Constitutional: Patient appears well-developed and well-nourished. Obese. No distress.  HEENT: head atraumatic, normocephalic, pupils equal and reactive to light, EOM's intact, R TM without erythema or bulging; L TM with bulging but no erythema.  No maxillary or frontal sinus pain on palpation, neck supple without lymphadenopathy, oropharynx mildly erythematous and moist without exudate Cardiovascular: Normal rate, regular rhythm, S1/S2 present.  No murmur or rub heard. No BLE edema. Pulmonary/Chest: Effort  normal and breath sounds slightly diminished throughout; deep inspiration causes dry cough during exam. No respiratory distress or retractions. Abdominal: Soft and generalized mild tenderness without guarding or distension, bowel sounds present x4 quadrants.  Psychiatric: Patient has a normal mood and affect. behavior is normal. Judgment and thought content normal.  Recent Results (from the past 2160 hour(s))  POCT urinalysis dipstick     Status: Abnormal   Collection Time: 10/06/17 11:20 AM  Result Value Ref Range   Color, UA yellow    Clarity, UA clear    Glucose, UA neg    Bilirubin, UA neg    Ketones, UA neg    Spec Grav, UA 1.010 1.010 - 1.025   Blood, UA neg    pH, UA 8.0 5.0 - 8.0   Protein, UA 30    Urobilinogen, UA 0.2 0.2 or 1.0 E.U./dL   Nitrite, UA neg    Leukocytes, UA Moderate (2+) (A) Negative  Urine Culture     Status: None   Collection Time: 10/06/17  2:32 PM  Result Value Ref Range   MICRO NUMBER: [id number]    SPECIMEN QUALITY: ADEQUATE    Sample Source URINE    STATUS: FINAL    Result:      Multiple organisms present, each less than 10,000 CFU/mL. These organisms, commonly found on external and internal genitalia, are considered to be colonizers. No further testing  performed.  POCT Influenza A/B     Status: Normal   Collection Time: 12/21/17  9:56 AM  Result Value Ref Range   Influenza A, POC Negative Negative   Influenza B, POC Negative Negative     Assessment & Plan  1. Respiratory infection - OTC Flonase daily and Tylenol PRN - budesonide-formoterol (SYMBICORT) 80-4.5 MCG/ACT inhaler; Inhale 2 puffs into the lungs 2 (two) times daily.  Dispense: 1 Inhaler; Refill: 1 - promethazine-dextromethorphan (PROMETHAZINE-DM) 6.25-15 MG/5ML syrup; Take 5 mLs by mouth 4 (four) times daily as needed for cough.  Dispense: 180 mL; Refill: 0  2. Fever, unspecified fever cause - POCT Influenza A/B - Negative  3. Epigastric pain - Avoid Triggers - CBC w/Diff/Platelet - COMPLETE METABOLIC PANEL WITH GFR - H. pylori breath test - ranitidine (ZANTAC) 150 MG tablet; Take 1 tablet (150 mg total) by mouth 2 (two) times daily.  Dispense: 60 tablet; Refill: 1  4. Hiatal hernia - ranitidine (ZANTAC) 150 MG tablet; Take 1 tablet (150 mg total) by mouth 2 (two) times daily.  Dispense: 60 tablet; Refill: 1  -Red flags and when to present for emergency care or RTC including fever >101.58F, chest pain, shortness of breath, new/worsening/un-resolving symptoms, severe abdominal, reviewed with patient at time of visit. Follow up and care instructions discussed and provided in AVS.

## 2017-12-22 LAB — COMPLETE METABOLIC PANEL WITH GFR
AG RATIO: 1.3 (calc) (ref 1.0–2.5)
ALBUMIN MSPROF: 3.9 g/dL (ref 3.6–5.1)
ALKALINE PHOSPHATASE (APISO): 95 U/L (ref 33–130)
ALT: 13 U/L (ref 6–29)
AST: 16 U/L (ref 10–35)
BILIRUBIN TOTAL: 0.5 mg/dL (ref 0.2–1.2)
BUN: 10 mg/dL (ref 7–25)
CO2: 30 mmol/L (ref 20–32)
Calcium: 9.4 mg/dL (ref 8.6–10.4)
Chloride: 102 mmol/L (ref 98–110)
Creat: 0.85 mg/dL (ref 0.50–0.99)
GFR, EST AFRICAN AMERICAN: 83 mL/min/{1.73_m2} (ref >=60)
GFR, Est Non African American: 71 mL/min/{1.73_m2} (ref >=60)
GLOBULIN: 3.1 g/dL (ref 1.9–3.7)
GLUCOSE: 88 mg/dL (ref 65–99)
POTASSIUM: 4.5 mmol/L (ref 3.5–5.3)
SODIUM: 140 mmol/L (ref 135–146)
TOTAL PROTEIN: 7 g/dL (ref 6.1–8.1)

## 2017-12-22 LAB — CBC WITH DIFFERENTIAL/PLATELET
BASOS ABS: 19 {cells}/uL (ref 0–200)
Basophils Relative: 0.3 %
EOS ABS: 132 {cells}/uL (ref 15–500)
Eosinophils Relative: 2.1 %
HEMATOCRIT: 38.9 % (ref 35.0–45.0)
Hemoglobin: 12.6 g/dL (ref 11.7–15.5)
Lymphs Abs: 1663 cells/uL (ref 850–3900)
MCH: 27 pg (ref 27.0–33.0)
MCHC: 32.4 g/dL (ref 32.0–36.0)
MCV: 83.5 fL (ref 80.0–100.0)
MONOS PCT: 7.5 %
MPV: 11.2 fL (ref 7.5–12.5)
Neutro Abs: 4013 cells/uL (ref 1500–7800)
Neutrophils Relative %: 63.7 %
Platelets: 231 10*3/uL (ref 140–400)
RBC: 4.66 10*6/uL (ref 3.80–5.10)
RDW: 13.3 % (ref 11.0–15.0)
Total Lymphocyte: 26.4 %
WBC: 6.3 10*3/uL (ref 3.8–10.8)
WBCMIX: 473 {cells}/uL (ref 200–950)

## 2017-12-22 LAB — H. PYLORI BREATH TEST: H. PYLORI BREATH TEST: NOT DETECTED

## 2018-01-16 ENCOUNTER — Telehealth: Payer: Self-pay

## 2018-01-16 ENCOUNTER — Ambulatory Visit (INDEPENDENT_AMBULATORY_CARE_PROVIDER_SITE_OTHER): Payer: PPO | Admitting: Family Medicine

## 2018-01-16 ENCOUNTER — Encounter: Payer: Self-pay | Admitting: Family Medicine

## 2018-01-16 VITALS — BP 122/78 | HR 62 | Temp 97.9°F | Resp 16 | Ht 66.0 in | Wt 205.0 lb

## 2018-01-16 DIAGNOSIS — K219 Gastro-esophageal reflux disease without esophagitis: Secondary | ICD-10-CM | POA: Diagnosis not present

## 2018-01-16 DIAGNOSIS — R102 Pelvic and perineal pain: Secondary | ICD-10-CM

## 2018-01-16 DIAGNOSIS — M545 Low back pain: Secondary | ICD-10-CM

## 2018-01-16 DIAGNOSIS — E1165 Type 2 diabetes mellitus with hyperglycemia: Secondary | ICD-10-CM | POA: Diagnosis not present

## 2018-01-16 DIAGNOSIS — Z794 Long term (current) use of insulin: Secondary | ICD-10-CM

## 2018-01-16 DIAGNOSIS — E1169 Type 2 diabetes mellitus with other specified complication: Secondary | ICD-10-CM

## 2018-01-16 DIAGNOSIS — I10 Essential (primary) hypertension: Secondary | ICD-10-CM

## 2018-01-16 DIAGNOSIS — E785 Hyperlipidemia, unspecified: Secondary | ICD-10-CM

## 2018-01-16 DIAGNOSIS — G43009 Migraine without aura, not intractable, without status migrainosus: Secondary | ICD-10-CM | POA: Diagnosis not present

## 2018-01-16 DIAGNOSIS — E041 Nontoxic single thyroid nodule: Secondary | ICD-10-CM

## 2018-01-16 DIAGNOSIS — K581 Irritable bowel syndrome with constipation: Secondary | ICD-10-CM | POA: Diagnosis not present

## 2018-01-16 DIAGNOSIS — G8929 Other chronic pain: Secondary | ICD-10-CM

## 2018-01-16 LAB — POCT GLYCOSYLATED HEMOGLOBIN (HGB A1C): Hemoglobin A1C: 6.2

## 2018-01-16 LAB — POCT UA - MICROALBUMIN: Microalbumin Ur, POC: 20 mg/L

## 2018-01-16 MED ORDER — TRIAMTERENE-HCTZ 37.5-25 MG PO TABS: 1.0000 | ORAL_TABLET | Freq: Every day | ORAL | 1 refills | Status: DC

## 2018-01-16 MED ORDER — ATENOLOL 25 MG PO TABS: 25.0000 mg | ORAL_TABLET | Freq: Every day | ORAL | 1 refills | Status: DC

## 2018-01-16 MED ORDER — RANITIDINE HCL 150 MG PO TABS: 150.0000 mg | ORAL_TABLET | Freq: Two times a day (BID) | ORAL | 1 refills | Status: DC

## 2018-01-16 MED ORDER — LUBIPROSTONE 24 MCG PO CAPS: 24.0000 ug | ORAL_CAPSULE | Freq: Two times a day (BID) | ORAL | 1 refills | Status: DC

## 2018-01-16 NOTE — Progress Notes (Addendum)
Name: Morgan Peterson   MRN: [id number]    DOB: 1951/09/17   Date:01/16/2018       Progress Note  Subjective  Chief Complaint  Chief Complaint  Patient presents with  . Medication Refill  . Hypertension    Denies any symptoms  . Diabetes    Checks twice daily, Lowest-70 Average-80-110 Highest-145   . Abdominal Discomfort    Feels like she has a cyst on her ovary-like she has years ago. On left lower abdominal disccomfort is constant since the past 2 months. Would like to discuss an [place].  . Gastroesophageal Reflux    Has been better with Zantac  . Migraine    Has not had one in several months  . Hyperlipidemia    Unable to tolerate Crestor due to acid reflux    HPI   HTN: bp is at goal today, she has been taking medication as prescribed, denies chest pain or palpitation, on Atenolol and diuretics, no side effects. Allergic to ACE - angioedema  DM: she was seeing Endocrinologist. Dr. Elsie Stain because hgbA1C 13% April 2017 she was She has been on Victoza and Lantus. Last hgbA1C was 6.4% and today down to 6.2%   and negative urine micro 12/2017. No polyphagia, no polydipsia or polyuria. She stopped taking statin because of muscle pain, however explained importance of statin therapy with her risk, and she willing to resume Crestor. Fsbs going down to 70's at times, advised to go down on Lantus to keep fasting glucose between 85-140  Migraine headaches: she is not taking Topamax because she likes drinking red wine occasionally. Migraine is  usually on the left side of head, it starts as a pressure on left temporal area and radiates to nuchal area, scalp is sensitive to touch on left occipital area, she has associated photophobia. At times she has nausea, but no vomiting. She denies weakness. Symptoms improves with Imitrex and rest within one hour. There was a severe episode in 2018 and we ordered an MRI, but symptoms improved and no recent episodes of migraine  GAD: she is not on SSRI,  she has bee off BZD and is doing well  IBS constipation type: she has a long history of IBS constipation. She states her bowel movements are a couple of times a week, last bowel movements 2 days ago, and Bristol scale is between Type 1 to Type 4 Discussed Amitiza and she would like to try it now, worried about this recurrent lower abdominal pain that improves with bowel movements, not very compliant with miralax.   Pelvic pain: she has noticed recurrent episodes, initially thought to be secondary to exercise, urine culture negative, but continues to have intermittent episodes of lower abdominal pain, supra pubic and radiates to left lower quadrant, history of ovarian cyst. No dysuria, change in urinary frequency or hematuria, no vaginal bleeding or blood in stools, she has IBS constipation kind. No fever or chills  Patient Active Problem List   Diagnosis Date Noted  . Chronic low back pain without sciatica 08/18/2017  . Irritable bowel syndrome with constipation 05/02/2017  . Vitamin D deficiency 09/09/2016  . Hiatal hernia 06/02/2016  . Allergic rhinitis, seasonal 06/02/2016  . Adult BMI 30+ 04/30/2016  . Angio-edema 04/30/2016  . Hyperlipidemia 11/04/2015  . Cervical radiculopathy at C6 07/31/2015  . Neuroma digital nerve 07/31/2015  . Type 2 diabetes mellitus with hyperglycemia, with long-term current use of insulin ([place]) 06/30/2015  . Right shoulder pain 06/30/2015  . Cervical disc  disease 06/30/2015  . Essential hypertension 06/30/2015  . History of breast cancer in female 03/23/2009    Past Surgical History:  Procedure Laterality Date  . ABDOMINAL EXPLORATION SURGERY  2000  . BREAST BIOPSY Right 2010   +   . BREAST EXCISIONAL BIOPSY Right 2010   + mammo site inasive mammo ca  . BREAST LUMPECTOMY Right 2010  . COLONOSCOPY  2003, 2013   Dr Alveta Heimlich, Dr. Bary Castilla  . TUBAL LIGATION  20 years ago    Family History  Problem Relation Age of Onset  . Hypertension Mother   .  Hyperlipidemia Mother   . Diabetes Father   . Kidney disease Father   . Diabetes Brother   . Colon cancer Maternal Uncle   . Breast cancer Maternal Aunt     Social History   Socioeconomic History  . Marital status: Married    Spouse name: Not on file  . Number of children: Not on file  . Years of education: Not on file  . Highest education level: Not on file  Social Needs  . Financial resource strain: Not on file  . Food insecurity - worry: Not on file  . Food insecurity - inability: Not on file  . Transportation needs - medical: Not on file  . Transportation needs - non-medical: Not on file  Occupational History  . Not on file  Tobacco Use  . Smoking status: Former Smoker    Packs/day: 0.25    Years: 10.00    Pack years: 2.50    Types: Cigarettes  . Smokeless tobacco: Never Used  Substance and Sexual Activity  . Alcohol use: Yes    Alcohol/week: 0.0 oz    Comment: wine  . Drug use: No  . Sexual activity: Not Currently    Comment: husband has ED  Other Topics Concern  . Not on file  Social History Narrative  . Not on file     Current Outpatient Medications:  .  aspirin EC 81 MG tablet, Take 1 tablet (81 mg total) by mouth daily., Disp: 30 tablet, Rfl: 0 .  atenolol (TENORMIN) 25 MG tablet, Take 1 tablet (25 mg total) by mouth daily., Disp: 90 tablet, Rfl: 1 .  Blood Glucose Monitoring Suppl (ONETOUCH VERIO) w/Device KIT, 1 Device by Does not apply route once., Disp: 1 kit, Rfl: 0 .  budesonide-formoterol (SYMBICORT) 80-4.5 MCG/ACT inhaler, Inhale 2 puffs into the lungs 2 (two) times daily., Disp: 1 Inhaler, Rfl: 1 .  calcium carbonate 200 MG capsule, Take 200 mg by mouth 2 (two) times daily with a meal. With Vitamin D3, Disp: , Rfl:  .  Ferrous Sulfate (IRON) 325 (65 Fe) MG TABS, Take 1 tablet by mouth daily., Disp: , Rfl:  .  fluticasone (FLONASE) 50 MCG/ACT nasal spray, Place 2 sprays into both nostrils daily., Disp: 16 g, Rfl: 2 .  glucose blood (ONETOUCH  VERIO) test strip, Use as instructed, Disp: 100 each, Rfl: 12 .  ibuprofen (ADVIL,MOTRIN) 800 MG tablet, Take 1 tablet (800 mg total) by mouth every 8 (eight) hours as needed., Disp: 30 tablet, Rfl: 0 .  Insulin Glargine (LANTUS SOLOSTAR) 100 UNIT/ML Solostar Pen, Inject 8 Units into the skin daily at 10 pm. (Patient taking differently: Inject 8 Units into the skin daily before breakfast. ), Disp: 5 pen, Rfl: PRN .  Insulin Pen Needle (FIFTY50 PEN NEEDLES) 31G X 8 MM MISC, Use as directed., Disp: , Rfl:  .  Lancets (ONETOUCH ULTRASOFT) lancets, Use as  instructed, Disp: 100 each, Rfl: 3 .  polyethylene glycol powder (GLYCOLAX/MIRALAX) powder, Take 17 g by mouth 2 (two) times daily as needed., Disp: 3350 g, Rfl: 1 .  ranitidine (ZANTAC) 150 MG tablet, Take 1 tablet (150 mg total) by mouth 2 (two) times daily., Disp: 180 tablet, Rfl: 1 .  triamterene-hydrochlorothiazide (MAXZIDE-25) 37.5-25 MG tablet, Take 1 tablet by mouth daily., Disp: 90 tablet, Rfl: 1 .  VICTOZA 18 MG/3ML SOPN, Inject 0.3 mLs (1.8 mg total) into the skin daily. (Patient taking differently: Inject 1.2 mg into the skin daily. ), Disp: 5 pen, Rfl: 2 .  vitamin B-12 (CYANOCOBALAMIN) 1000 MCG tablet, Take 1 tablet by mouth 2 (two) times a week., Disp: , Rfl:  .  cholecalciferol (VITAMIN D) 1000 UNITS tablet, Take 1,000 Units by mouth daily., Disp: , Rfl:  .  rosuvastatin (CRESTOR) 5 MG tablet, Take 1 tablet (5 mg total) by mouth daily. (Patient not taking: Reported on 01/16/2018), Disp: 90 tablet, Rfl: 1 .  SUMAtriptan (IMITREX) 50 MG tablet, TAKE 1 TABLET BY MOUTH EVERY 2 HOURS AS NEEDED FOR MIGRAINE-MAY REPEAT IN 2 HOURS IF HEADACHE PERSIS (Patient not taking: Reported on 01/16/2018), Disp: 10 tablet, Rfl: 0  Allergies  Allergen Reactions  . Levaquin [Levofloxacin In D5w] Swelling    Other reaction(s): Arthralgia (Joint Pain)  . Losartan     Other reaction(s): Angioedema  . Amlodipine-Olmesartan   . Codeine   . Gabapentin     cns  side effects.  . Glipizide     Other reaction(s): Abdominal pain  . Invokana [Canagliflozin] Other (See Comments)    Bladder pain  . Lisinopril Swelling    Angioedema  . Metformin And Related Nausea Only  . Onglyza [Saxagliptin] Other (See Comments)    Increased PVC's  . Tramadol Nausea And Vomiting  . Actos [Pioglitazone] Other (See Comments) and Nausea And Vomiting    Bladder pain  . Atorvastatin Other (See Comments)  . Carvedilol Other (See Comments)    NUMBNESS, TINGLING, ACHING IN ARMS  . Prednisone Palpitations     ROS  Constitutional: Negative for fever or weight change.  Respiratory: Negative for cough and shortness of breath.   Cardiovascular: Negative for chest pain or palpitations.  Gastrointestinal: Positive  for abdominal pain, but no  bowel changes.  Musculoskeletal: Negative for gait problem or joint swelling.  Skin: Negative for rash.  Neurological: Negative for dizziness or headache.  No other specific complaints in a complete review of systems (except as listed in HPI above).  Objective  Vitals:   01/16/18 0927  BP: 122/78  Pulse: 62  Resp: 16  Temp: 97.9 F (36.6 C)  TempSrc: Oral  SpO2: 98%  Weight: 205 lb (93 kg)  Height: '5\' 6"'  (1.676 m)    Body mass index is 33.09 kg/m.  Physical Exam  Constitutional: Patient appears well-developed and well-nourished. Obese  No distress.  HEENT: head atraumatic, normocephalic, pupils equal and reactive to light,neck supple, throat within normal limits, no thyromegaly Cardiovascular: Normal rate, regular rhythm and normal heart sounds.  No murmur heard. No BLE edema. Pulmonary/Chest: Effort normal and breath sounds normal. No respiratory distress. Abdominal: Soft.  There is tenderness LLQ and epigastric, mild, no guarding or rebound Psychiatric: Patient has a normal mood and affect. behavior is normal. Judgment and thought content normal.  Recent Results (from the past 2160 hour(s))  POCT Influenza A/B      Status: Normal   Collection Time: 12/21/17  9:56 AM  Result  Value Ref Range   Influenza A, POC Negative Negative   Influenza B, POC Negative Negative  CBC w/Diff/Platelet     Status: None   Collection Time: 12/21/17 10:12 AM  Result Value Ref Range   WBC 6.3 3.8 - 10.8 Thousand/uL   RBC 4.66 3.80 - 5.10 Million/uL   Hemoglobin 12.6 11.7 - 15.5 g/dL   HCT 38.9 35.0 - 45.0 %   MCV 83.5 80.0 - 100.0 fL   MCH 27.0 27.0 - 33.0 pg   MCHC 32.4 32.0 - 36.0 g/dL   RDW 13.3 11.0 - 15.0 %   Platelets 231 140 - 400 Thousand/uL   MPV 11.2 7.5 - 12.5 fL   Neutro Abs 4,013 1,500 - 7,800 cells/uL   Lymphs Abs 1,663 850 - 3,900 cells/uL   WBC mixed population 473 200 - 950 cells/uL   Eosinophils Absolute 132 15 - 500 cells/uL   Basophils Absolute 19 0 - 200 cells/uL   Neutrophils Relative % 63.7 %   Total Lymphocyte 26.4 %   Monocytes Relative 7.5 %   Eosinophils Relative 2.1 %   Basophils Relative 0.3 %  COMPLETE METABOLIC PANEL WITH GFR     Status: None   Collection Time: 12/21/17 10:12 AM  Result Value Ref Range   Glucose, Bld 88 65 - 99 mg/dL    Comment: .            Fasting reference interval .    BUN 10 7 - 25 mg/dL   Creat 0.85 0.50 - 0.99 mg/dL    Comment: For patients >68 years of age, the reference limit for Creatinine is approximately 13% higher for people identified as African-American. .    GFR, Est Non African American 71 > OR = 60 mL/min/1.93m   GFR, Est African American 83 > OR = 60 mL/min/1.723m  BUN/Creatinine Ratio NOT APPLICABLE 6 - 22 (calc)   Sodium 140 135 - 146 mmol/L   Potassium 4.5 3.5 - 5.3 mmol/L   Chloride 102 98 - 110 mmol/L   CO2 30 20 - 32 mmol/L   Calcium 9.4 8.6 - 10.4 mg/dL   Total Protein 7.0 6.1 - 8.1 g/dL   Albumin 3.9 3.6 - 5.1 g/dL   Globulin 3.1 1.9 - 3.7 g/dL (calc)   AG Ratio 1.3 1.0 - 2.5 (calc)   Total Bilirubin 0.5 0.2 - 1.2 mg/dL   Alkaline phosphatase (APISO) 95 33 - 130 U/L   AST 16 10 - 35 U/L   ALT 13 6 - 29 U/L  H.  pylori breath test     Status: None   Collection Time: 12/21/17 10:12 AM  Result Value Ref Range   H. pylori Breath Test NOT DETECTED NOT DETECT    Comment: . Antimicrobials, proton pump inhibitors, and bismuth preparations are known to suppress H. pylori, and  ingestion of these prior to H. pylori diagnostic testing may lead to false negative results. If clinically  indicated, the test may be repeated on a new specimen obtained two weeks after discontinuing treatment. However, a positive result is still clinically valid.   POCT UA - Microalbumin     Status: Normal   Collection Time: 01/16/18  9:41 AM  Result Value Ref Range   Microalbumin Ur, POC 20 mg/L   Creatinine, POC  mg/dL   Albumin/Creatinine Ratio, Urine, POC    POCT HgB A1C     Status: None   Collection Time: 01/16/18  9:53 AM  Result Value Ref  Range   Hemoglobin A1C 6.2       PHQ2/9: Depression screen [facility] 2/9 12/21/2017 09/14/2017 06/17/2017 03/23/2017 10/28/2016  Decreased Interest 0 0 0 0 0  Down, Depressed, Hopeless 0 0 0 0 0  PHQ - 2 Score 0 0 0 0 0     Fall Risk: Fall Risk  12/21/2017 09/14/2017 06/17/2017 03/23/2017 10/28/2016  Falls in the past year? No No No No No      Assessment & Plan  1. Type 2 diabetes mellitus with hyperglycemia, with long-term current use of insulin (HCC)  - POCT HgB A1C - POCT UA - Microalbumin  2. Pelvic pain  - Ambulatory referral to General Surgery - US Pelvis Complete; Future  - transvaginal   3. Essential hypertension  - atenolol (TENORMIN) 25 MG tablet; Take 1 tablet (25 mg total) by mouth daily.  Dispense: 90 tablet; Refill: 1 - triamterene-hydrochlorothiazide (MAXZIDE-25) 37.5-25 MG tablet; Take 1 tablet by mouth daily.  Dispense: 90 tablet; Refill: 1  4. Dyslipidemia associated with type 2 diabetes mellitus ([place])   5. Migraine without aura and without status migrainosus, not intractable   6. Chronic bilateral low back pain without sciatica  Doing well at this  time  7. GERD without esophagitis  Symptoms better with ranitidine advised to try taking only once daily  - ranitidine (ZANTAC) 150 MG tablet; Take 1 tablet (150 mg total) by mouth 2 (two) times daily.  Dispense: 180 tablet; Refill: 1  8. Irritable bowel syndrome with constipation  We will resume Amitiza it may help with lower abdominal pain   9. Thyroid nodule  - US THYROID; Future

## 2018-01-16 NOTE — Telephone Encounter (Signed)
Patient stated that the RX for Amitiza is too costly ($90) so she will not be able to pick it up.  Please advise.

## 2018-01-16 NOTE — Addendum Note (Signed)
Addended by: Steele Sizer F on: 01/16/2018 11:24 AM   Modules accepted: Orders

## 2018-01-16 NOTE — Telephone Encounter (Signed)
How much would be for 30 days?  What about Linzess or Trulance?  Miralax is not working for her

## 2018-01-16 NOTE — Telephone Encounter (Signed)
I contacted this patient to inform her that she has been scheduled to have her [place] this Friday, January 20, 2018 at 1:45pm at the [facility]. Patient was told to arrive at 1:30pm with a full bladder and that they will do both her pelvis and then the thyroid afterwards.

## 2018-01-16 NOTE — Telephone Encounter (Signed)
Based upon previous conversation, patient stated that she will do what was mentioned in office, she just wanted to let you know the cost and to see if you had any options.

## 2018-01-19 NOTE — Telephone Encounter (Signed)
Patient was driving and will call us back with the possible medications to call her insurance about. Also informed patient we have a free 30 day coupon for Trulance she could pick up and try to see if this would help her IBS.

## 2018-01-19 NOTE — Telephone Encounter (Signed)
Please ask her to contact insurance to find out other rx that are covered, we can also give him the free voucher for one month supply

## 2018-01-20 ENCOUNTER — Ambulatory Visit
Admission: RE | Admit: 2018-01-20 | Discharge: 2018-01-20 | Disposition: A | Discharge status: Home / Self Care | Payer: PPO | Source: Ambulatory Visit | Attending: Family Medicine | Admitting: Family Medicine

## 2018-01-20 DIAGNOSIS — E041 Nontoxic single thyroid nodule: Secondary | ICD-10-CM | POA: Diagnosis not present

## 2018-01-20 DIAGNOSIS — D259 Leiomyoma of uterus, unspecified: Secondary | ICD-10-CM | POA: Insufficient documentation

## 2018-01-20 DIAGNOSIS — R102 Pelvic and perineal pain: Secondary | ICD-10-CM | POA: Diagnosis not present

## 2018-01-20 DIAGNOSIS — D251 Intramural leiomyoma of uterus: Secondary | ICD-10-CM | POA: Diagnosis not present

## 2018-02-02 ENCOUNTER — Ambulatory Visit: Payer: PPO | Admitting: General Surgery

## 2018-02-09 ENCOUNTER — Ambulatory Visit: Payer: PPO | Admitting: General Surgery

## 2018-02-10 ENCOUNTER — Other Ambulatory Visit: Payer: Self-pay | Admitting: Family Medicine

## 2018-02-10 DIAGNOSIS — E1169 Type 2 diabetes mellitus with other specified complication: Secondary | ICD-10-CM

## 2018-02-10 NOTE — Telephone Encounter (Signed)
Copied from [place] [phone]. Topic: Quick Communication - See Telephone Encounter >> Feb 10, 2018  2:44 PM Hewitt Shorts wrote: CRM for notification. See Telephone encounter for:  Pt is needing test strips for her meter one [facility]ouch [facility] [address]   [employer] number [phone] 02/10/18.

## 2018-02-13 ENCOUNTER — Telehealth: Payer: Self-pay | Admitting: Family Medicine

## 2018-02-13 MED ORDER — GLUCOSE BLOOD VI STRP: ORAL_STRIP | 12 refills | Status: AC

## 2018-02-13 NOTE — Telephone Encounter (Signed)
Copied from [place]. Topic: Quick Communication - Rx Refill/Question >> Feb 13, 2018  3:54 PM Cecelia Byars, NT wrote: Medication: glucose blood (ONETOUCH VERIO) test strip Has the patient contacted their pharmacy? {yes  (Agent: If no, request that the patient contact the pharmacy for the refill.) Preferred Pharmacy (with phone number or street name)[id number] [place], [place] - [address] [phone] (Phone) [phone] (Fax) Agent: Please be advised that RX refills may take up to 3 business days. We ask that you follow-up with your pharmacy.

## 2018-02-14 NOTE — Telephone Encounter (Signed)
Provider filled strip request

## 2018-02-15 ENCOUNTER — Ambulatory Visit
Admission: RE | Admit: 2018-02-15 | Discharge: 2018-02-15 | Disposition: A | Discharge status: Home / Self Care | Payer: PPO | Source: Ambulatory Visit | Attending: General Surgery | Admitting: General Surgery

## 2018-02-15 DIAGNOSIS — Z1231 Encounter for screening mammogram for malignant neoplasm of breast: Secondary | ICD-10-CM | POA: Diagnosis not present

## 2018-02-23 ENCOUNTER — Ambulatory Visit (INDEPENDENT_AMBULATORY_CARE_PROVIDER_SITE_OTHER): Payer: PPO | Admitting: General Surgery

## 2018-02-23 ENCOUNTER — Encounter: Payer: Self-pay | Admitting: General Surgery

## 2018-02-23 VITALS — BP 120/80 | HR 70 | Resp 12 | Ht 66.0 in | Wt 205.0 lb

## 2018-02-23 DIAGNOSIS — Z853 Personal history of malignant neoplasm of breast: Secondary | ICD-10-CM | POA: Diagnosis not present

## 2018-02-23 NOTE — Progress Notes (Signed)
Patient ID: Morgan Peterson, female   DOB: January 07, 1951, 67 y.o.   MRN: [id number]  Chief Complaint  Patient presents with  . Follow-up    HPI Morgan Peterson is a 67 y.o. female  who presents for a breast evaluation. The most recent mammogram was done on 02/16/2018.  Patient does perform regular self breast checks and gets regular mammograms done. Last colonoscopy was 2013.                                                                      Marland KitchenHPI  Past Medical History:  Diagnosis Date  . Cancer [facility]) 2010   Right Breast  . Diabetes mellitus without complication ([place]) 1751  . Hypertension   . IBS (irritable bowel syndrome)   . 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 Arimidex therapy completed September 2015  . Personal history of malignant neoplasm of breast 2010    Past Surgical History:  Procedure Laterality Date  . ABDOMINAL EXPLORATION SURGERY  2000  . BREAST BIOPSY Right 2010   +   . BREAST EXCISIONAL BIOPSY Right 2010   + mammo site inasive mammo ca  . BREAST LUMPECTOMY Right 2010  . COLONOSCOPY  2003, 2013   Dr Alveta Heimlich, Dr. Bary Castilla  . TUBAL LIGATION  20 years ago    Family History  Problem Relation Age of Onset  . Hypertension Mother   . Hyperlipidemia Mother   . Diabetes Father   . Kidney disease Father   . Diabetes Brother   . Colon cancer Maternal Uncle   . Breast cancer Maternal Aunt     Social History Social History   Tobacco Use  . Smoking status: Former Smoker    Packs/day: 0.25    Years: 10.00    Pack years: 2.50    Types: Cigarettes  . Smokeless tobacco: Never Used  Substance Use Topics  . Alcohol use: Yes    Alcohol/week: 0.0 oz    Comment: wine  . Drug use: No    Allergies  Allergen Reactions  . Levaquin [Levofloxacin In D5w] Swelling    Other reaction(s):  Arthralgia (Joint Pain)  . Losartan     Other reaction(s): Angioedema  . Amlodipine-Olmesartan   . Codeine   . Gabapentin     cns side effects.  . Glipizide     Other reaction(s): Abdominal pain  . Invokana [Canagliflozin] Other (See Comments)    Bladder pain  . Lisinopril Swelling    Angioedema  . Metformin And Related Nausea Only  . Onglyza [Saxagliptin] Other (See Comments)    Increased PVC's  . Tramadol Nausea And Vomiting  . Actos [Pioglitazone] Other (See Comments) and Nausea And Vomiting    Bladder pain  . Atorvastatin Other (See Comments)  . Carvedilol Other (See Comments)    NUMBNESS, TINGLING, ACHING IN ARMS  . Prednisone Palpitations    Current Outpatient Medications  Medication Sig Dispense Refill  . aspirin EC 81 MG tablet Take 1 tablet (81 mg total) by mouth daily. 30 tablet 0  . atenolol (TENORMIN) 25 MG  tablet Take 1 tablet (25 mg total) by mouth daily. 90 tablet 1  . Blood Glucose Monitoring Suppl (ONETOUCH VERIO) w/Device KIT 1 Device by Does not apply route once. 1 kit 0  . budesonide-formoterol (SYMBICORT) 80-4.5 MCG/ACT inhaler Inhale 2 puffs into the lungs 2 (two) times daily. 1 Inhaler 1  . calcium carbonate 200 MG capsule Take 200 mg by mouth 2 (two) times daily with a meal. With Vitamin D3    . cholecalciferol (VITAMIN D) 1000 UNITS tablet Take 1,000 Units by mouth daily.    . Ferrous Sulfate (IRON) 325 (65 Fe) MG TABS Take 1 tablet by mouth daily.    . fluticasone (FLONASE) 50 MCG/ACT nasal spray Place 2 sprays into both nostrils daily. 16 g 2  . glucose blood (ONETOUCH VERIO) test strip Use as instructed 100 each 12  . ibuprofen (ADVIL,MOTRIN) 800 MG tablet Take 1 tablet (800 mg total) by mouth every 8 (eight) hours as needed. 30 tablet 0  . Insulin Glargine (LANTUS SOLOSTAR) 100 UNIT/ML Solostar Pen Inject 8 Units into the skin daily at 10 pm. (Patient taking differently: Inject 8 Units into the skin daily before breakfast. ) 5 pen PRN  . Insulin Pen  Needle (FIFTY50 PEN NEEDLES) 31G X 8 MM MISC Use as directed.    . Lancets (ONETOUCH ULTRASOFT) lancets Use as instructed 100 each 3  . lubiprostone (AMITIZA) 24 MCG capsule Take 1 capsule (24 mcg total) by mouth 2 (two) times daily with a meal. 180 capsule 1  . polyethylene glycol powder (GLYCOLAX/MIRALAX) powder Take 17 g by mouth 2 (two) times daily as needed. 3350 g 1  . ranitidine (ZANTAC) 150 MG tablet Take 1 tablet (150 mg total) by mouth 2 (two) times daily. 180 tablet 1  . rosuvastatin (CRESTOR) 5 MG tablet Take 1 tablet (5 mg total) by mouth daily. 90 tablet 1  . SUMAtriptan (IMITREX) 50 MG tablet TAKE 1 TABLET BY MOUTH EVERY 2 HOURS AS NEEDED FOR MIGRAINE-MAY REPEAT IN 2 HOURS IF HEADACHE PERSIS 10 tablet 0  . triamterene-hydrochlorothiazide (MAXZIDE-25) 37.5-25 MG tablet Take 1 tablet by mouth daily. 90 tablet 1  . VICTOZA 18 MG/3ML SOPN Inject 0.3 mLs (1.8 mg total) into the skin daily. (Patient taking differently: Inject 1.2 mg into the skin daily. ) 5 pen 2  . vitamin B-12 (CYANOCOBALAMIN) 1000 MCG tablet Take 1 tablet by mouth 2 (two) times a week.     No current facility-administered medications for this visit.     Review of Systems Review of Systems  Constitutional: Negative.   Respiratory: Negative.   Cardiovascular: Negative.     Blood pressure 120/80, pulse 70, resp. rate 12, height '5\' 6"'  (1.676 m), weight 205 lb (93 kg).  Physical Exam Physical Exam  Constitutional: She is oriented to person, place, and time. She appears well-developed and well-nourished.  Eyes: Conjunctivae are normal. No scleral icterus.  Neck: Neck supple.  Cardiovascular: Normal rate, regular rhythm and normal heart sounds.  Pulmonary/Chest: Effort normal and breath sounds normal. Right breast exhibits no inverted nipple, no mass, no nipple discharge, no skin change and no tenderness. Left breast exhibits no inverted nipple, no mass, no nipple discharge, no skin change and no tenderness.     Lymphadenopathy:    She has no cervical adenopathy.    She has no axillary adenopathy.  Neurological: She is alert and oriented to person, place, and time.  Skin: Skin is warm and dry.    Data Reviewed Bilateral screening  mammograms dated February 16, 2018 reviewed.  BI-RADS-1.  Assessment    Unremarkable breast exam.    Plan   Patient will be asked to return to the office in one year with a bilateral screening mammogram.The patient is aware to call back for any questions or concerns.   HPI, Physical Exam, Assessment and Plan have been scribed under the direction and in the presence of Hervey Ard, MD.  Morgan Peterson, CMA  I have completed the exam and reviewed the above documentation for accuracy and completeness.  I agree with the above.  Haematologist has been used and any errors in dictation or transcription are unintentional.  Hervey Ard, M.D., F.A.C.S.  Forest Gleason [name] 02/25/2018, 10:21 AM

## 2018-02-23 NOTE — Patient Instructions (Signed)
Patient will be asked to return to the office in one year with a bilateral screening mammogram. The patient is aware to call back for any questions or concerns. 

## 2018-02-25 ENCOUNTER — Encounter: Payer: Self-pay | Admitting: General Surgery

## 2018-03-07 ENCOUNTER — Encounter: Payer: Self-pay | Admitting: General Surgery

## 2018-04-26 DIAGNOSIS — E119 Type 2 diabetes mellitus without complications: Secondary | ICD-10-CM | POA: Diagnosis not present

## 2018-04-26 DIAGNOSIS — Z794 Long term (current) use of insulin: Secondary | ICD-10-CM | POA: Diagnosis not present

## 2018-04-26 DIAGNOSIS — I10 Essential (primary) hypertension: Secondary | ICD-10-CM | POA: Diagnosis not present

## 2018-04-26 DIAGNOSIS — E1159 Type 2 diabetes mellitus with other circulatory complications: Secondary | ICD-10-CM | POA: Diagnosis not present

## 2018-04-26 DIAGNOSIS — E1169 Type 2 diabetes mellitus with other specified complication: Secondary | ICD-10-CM | POA: Diagnosis not present

## 2018-04-26 DIAGNOSIS — E785 Hyperlipidemia, unspecified: Secondary | ICD-10-CM | POA: Diagnosis not present

## 2018-04-26 LAB — HM DIABETES FOOT EXAM: HM DIABETIC FOOT EXAM: NEGATIVE

## 2018-04-29 ENCOUNTER — Other Ambulatory Visit: Payer: Self-pay | Admitting: Family Medicine

## 2018-04-29 DIAGNOSIS — E1169 Type 2 diabetes mellitus with other specified complication: Secondary | ICD-10-CM

## 2018-04-29 DIAGNOSIS — E785 Hyperlipidemia, unspecified: Principal | ICD-10-CM

## 2018-05-16 ENCOUNTER — Ambulatory Visit: Payer: PPO | Admitting: Family Medicine

## 2018-05-25 DIAGNOSIS — E119 Type 2 diabetes mellitus without complications: Secondary | ICD-10-CM | POA: Diagnosis not present

## 2018-06-30 DIAGNOSIS — E119 Type 2 diabetes mellitus without complications: Secondary | ICD-10-CM | POA: Diagnosis not present

## 2018-06-30 DIAGNOSIS — R0602 Shortness of breath: Secondary | ICD-10-CM | POA: Diagnosis not present

## 2018-06-30 DIAGNOSIS — I10 Essential (primary) hypertension: Secondary | ICD-10-CM | POA: Diagnosis not present

## 2018-06-30 DIAGNOSIS — G8929 Other chronic pain: Secondary | ICD-10-CM | POA: Diagnosis not present

## 2018-06-30 DIAGNOSIS — E669 Obesity, unspecified: Secondary | ICD-10-CM | POA: Diagnosis not present

## 2018-06-30 DIAGNOSIS — R002 Palpitations: Secondary | ICD-10-CM | POA: Diagnosis not present

## 2018-06-30 DIAGNOSIS — M545 Low back pain: Secondary | ICD-10-CM | POA: Diagnosis not present

## 2018-06-30 DIAGNOSIS — I208 Other forms of angina pectoris: Secondary | ICD-10-CM | POA: Diagnosis not present

## 2018-06-30 DIAGNOSIS — E782 Mixed hyperlipidemia: Secondary | ICD-10-CM | POA: Diagnosis not present

## 2018-06-30 DIAGNOSIS — R9431 Abnormal electrocardiogram [ECG] [EKG]: Secondary | ICD-10-CM | POA: Diagnosis not present

## 2018-07-24 LAB — HM DIABETES EYE EXAM

## 2018-07-25 ENCOUNTER — Other Ambulatory Visit: Payer: Self-pay | Admitting: Family Medicine

## 2018-07-25 DIAGNOSIS — E785 Hyperlipidemia, unspecified: Principal | ICD-10-CM

## 2018-07-25 DIAGNOSIS — E1169 Type 2 diabetes mellitus with other specified complication: Secondary | ICD-10-CM

## 2018-09-18 ENCOUNTER — Encounter: Payer: PPO | Admitting: Family Medicine

## 2018-10-12 ENCOUNTER — Ambulatory Visit: Payer: PPO

## 2018-10-13 ENCOUNTER — Ambulatory Visit (INDEPENDENT_AMBULATORY_CARE_PROVIDER_SITE_OTHER): Payer: PPO

## 2018-10-13 DIAGNOSIS — Z23 Encounter for immunization: Secondary | ICD-10-CM | POA: Diagnosis not present

## 2018-11-03 ENCOUNTER — Encounter: Payer: Self-pay | Admitting: Family Medicine

## 2018-11-03 ENCOUNTER — Ambulatory Visit (INDEPENDENT_AMBULATORY_CARE_PROVIDER_SITE_OTHER): Payer: PPO | Admitting: Family Medicine

## 2018-11-03 VITALS — BP 118/72 | HR 84 | Temp 98.2°F | Resp 16 | Ht 66.0 in | Wt 204.0 lb

## 2018-11-03 DIAGNOSIS — E785 Hyperlipidemia, unspecified: Secondary | ICD-10-CM

## 2018-11-03 DIAGNOSIS — Z0000 Encounter for general adult medical examination without abnormal findings: Secondary | ICD-10-CM

## 2018-11-03 DIAGNOSIS — E669 Obesity, unspecified: Secondary | ICD-10-CM | POA: Diagnosis not present

## 2018-11-03 DIAGNOSIS — Z794 Long term (current) use of insulin: Secondary | ICD-10-CM | POA: Diagnosis not present

## 2018-11-03 DIAGNOSIS — E559 Vitamin D deficiency, unspecified: Secondary | ICD-10-CM | POA: Diagnosis not present

## 2018-11-03 DIAGNOSIS — Z853 Personal history of malignant neoplasm of breast: Secondary | ICD-10-CM | POA: Diagnosis not present

## 2018-11-03 DIAGNOSIS — I10 Essential (primary) hypertension: Secondary | ICD-10-CM | POA: Diagnosis not present

## 2018-11-03 DIAGNOSIS — E1165 Type 2 diabetes mellitus with hyperglycemia: Secondary | ICD-10-CM

## 2018-11-03 NOTE — Progress Notes (Signed)
Name: Morgan Peterson   MRN: [id number]    DOB: 07/25/1951   Date:11/03/2018       Progress Note  Subjective  Chief Complaint  Chief Complaint  Patient presents with  . Annual Exam    HPI  Patient presents for annual CPE.  Diet: She loves to eat vegetables, has meat every now and then, likes yogurt with granola or oatmeal Exercise: Hadn't been exercising because of sciatic nerve flare; wants to get back into it now that this is better. She is setting a goal to exercise 3 days a week.  USPSTF grade A and B recommendations    Office Visit from 11/03/2018 in [facility]  AUDIT-C Score  0     Depression:  Depression screen PHQ 2/9 11/03/2018 12/21/2017 09/14/2017 06/17/2017 03/23/2017  Decreased Interest 0 0 0 0 0  Down, Depressed, Hopeless 0 0 0 0 0  PHQ - 2 Score 0 0 0 0 0  Altered sleeping 0 - - - -  Tired, decreased energy 0 - - - -  Change in appetite 0 - - - -  Feeling bad or failure about yourself  0 - - - -  Trouble concentrating 0 - - - -  Moving slowly or fidgety/restless 0 - - - -  Suicidal thoughts 0 - - - -  PHQ-9 Score 0 - - - -  Difficult doing work/chores Not difficult at all - - - -   Hypertension: BP Readings from Last 3 Encounters:  11/03/18 118/72  02/23/18 120/80  01/16/18 122/78   Obesity: Wt Readings from Last 3 Encounters:  11/03/18 204 lb (92.5 kg)  02/23/18 205 lb (93 kg)  01/16/18 205 lb (93 kg)   BMI Readings from Last 3 Encounters:  11/03/18 32.93 kg/m  02/23/18 33.09 kg/m  01/16/18 33.09 kg/m    Hep C Screening: UTD STD testing and prevention (HIV/chl/gon/syphilis): Declines Intimate partner violence: No concerns Sexual History/Pain during Intercourse: No concerns Menstrual History/LMP/Abnormal Bleeding: Menopausal after age 55. No vaginal bleeding Incontinence Symptoms: No concerns  Advanced Care Planning: A voluntary discussion about advance care planning including the explanation and discussion of advance  directives.  Discussed health care proxy and Living will, and the patient was able to identify a health care proxy as Antonio Sebastiani.  Patient does not have a living will at present time. If patient does have living will, I have requested they bring this to the clinic to be scanned in to their chart.  Breast cancer:  HM Mammogram  Date Value Ref Range Status  08/20/2013 Self Reported Normal 0-4 Bi-Rad, Self Reported Normal Final    Comment:    per Allscripts Normal no changes    BRCA gene screening: Maternal Aunt with history. She is followed by Dr. [name] for previous RIGHT breast cancer (2010). Cervical cancer screening: Due in 2020  Osteoporosis Screening: DEXA in 2013; will check vitamin D today No results found for: HMDEXASCAN  Lipids: We will check today Lab Results  Component Value Date   CHOL 193 09/13/2017   CHOL 148 11/30/2016   CHOL 157 09/07/2016   Lab Results  Component Value Date   HDL 49 09/13/2017   HDL 40 11/30/2016   HDL 56 09/07/2016   Lab Results  Component Value Date   LDLCALC 130 (H) 09/13/2017   LDLCALC 90 11/30/2016   LDLCALC 87 09/07/2016   Lab Results  Component Value Date   TRIG 70 09/13/2017   TRIG 88 11/30/2016     TRIG 70 09/07/2016   Lab Results  Component Value Date   CHOLHDL 3.1 11/04/2015   No results found for: LDLDIRECT  Glucose: Seeing Dr. Honor Junes with [facility] - last A1C was May 2019 was 6.7% - has appt next week. Glucose  Date Value Ref Range Status  09/13/2017 86 65 - 99 mg/dL Final  09/07/2016 99 65 - 99 mg/dL Final  03/24/2015 162 (H) mg/dL Final    Comment:    65-99 NOTE: New Reference Range  02/25/15   09/12/2014 104 (H) 65 - 99 mg/dL Final  03/14/2014 146 (H) 65 - 99 mg/dL Final   Glucose, Bld  Date Value Ref Range Status  12/21/2017 88 65 - 99 mg/dL Final    Comment:    .            Fasting reference interval .   04/15/2016 99 65 - 99 mg/dL Final  10/09/2015 386 (H) 65 - 99 mg/dL Final   Glucose-Capillary   Date Value Ref Range Status  10/09/2015 353 (H) 65 - 99 mg/dL Final  10/01/2015 341 (H) 65 - 99 mg/dL Final  10/01/2015 347 (H) 65 - 99 mg/dL Final    Skin cancer: No concerning lesions Colorectal cancer: Family history (Maternal Aunt) of colorectal cancer; due in 2023 - sees Dr. Bary Castilla.   No changes in BM's, no BRB or dark and tarry stools, no diarrhea, minimal constipation. Lung cancer: Former Smoker (over 30 years ago)  Low Dose CT Chest recommended if Age 47-80 years, 30 pack-year currently smoking OR have quit w/in 15years. Patient does not qualify.   ECG: On file; no chest pain, shortness of breath, or palpitations.  Patient Active Problem List   Diagnosis Date Noted  . Chronic low back pain without sciatica 08/18/2017  . Irritable bowel syndrome with constipation 05/02/2017  . Vitamin D deficiency 09/09/2016  . Hiatal hernia 06/02/2016  . Allergic rhinitis, seasonal 06/02/2016  . Adult BMI 30+ 04/30/2016  . Angio-edema 04/30/2016  . Hyperlipidemia 11/04/2015  . Cervical radiculopathy at C6 07/31/2015  . Neuroma digital nerve 07/31/2015  . Type 2 diabetes mellitus with hyperglycemia, with long-term current use of insulin ([place]) 06/30/2015  . Right shoulder pain 06/30/2015  . Cervical disc disease 06/30/2015  . Essential hypertension 06/30/2015  . History of breast cancer in female 03/23/2009    Past Surgical History:  Procedure Laterality Date  . ABDOMINAL EXPLORATION SURGERY  2000  . BREAST BIOPSY Right 2010   +   . BREAST EXCISIONAL BIOPSY Right 2010   + mammo site inasive mammo ca  . BREAST LUMPECTOMY Right 2010  . COLONOSCOPY  07/19/2012   Normal exam, Dr. Bary Castilla  . TUBAL LIGATION  20 years ago    Family History  Problem Relation Age of Onset  . Hypertension Mother   . Hyperlipidemia Mother   . Diabetes Father   . Kidney disease Father   . Diabetes Brother   . Colon cancer Maternal Uncle   . Breast cancer Maternal Aunt     Social History    Socioeconomic History  . Marital status: Married    Spouse name: Not on file  . Number of children: Not on file  . Years of education: Not on file  . Highest education level: Not on file  Occupational History  . Not on file  Social Needs  . Financial resource strain: Not on file  . Food insecurity:    Worry: Not on file    Inability: Not on  file  . Transportation needs:    Medical: Not on file    Non-medical: Not on file  Tobacco Use  . Smoking status: Former Smoker    Packs/day: 0.25    Years: 10.00    Pack years: 2.50    Types: Cigarettes  . Smokeless tobacco: Never Used  Substance and Sexual Activity  . Alcohol use: Yes    Alcohol/week: 0.0 standard drinks    Comment: wine  . Drug use: No  . Sexual activity: Not Currently    Comment: husband has ED  Lifestyle  . Physical activity:    Days per week: Not on file    Minutes per session: Not on file  . Stress: Not on file  Relationships  . Social connections:    Talks on phone: Not on file    Gets together: Not on file    Attends religious service: Not on file    Active member of club or organization: Not on file    Attends meetings of clubs or organizations: Not on file    Relationship status: Not on file  . Intimate partner violence:    Fear of current or ex partner: Not on file    Emotionally abused: Not on file    Physically abused: Not on file    Forced sexual activity: Not on file  Other Topics Concern  . Not on file  Social History Narrative  . Not on file    Current Outpatient Medications:  .  aspirin EC 81 MG tablet, Take 1 tablet (81 mg total) by mouth daily., Disp: 30 tablet, Rfl: 0 .  atenolol (TENORMIN) 25 MG tablet, Take 1 tablet (25 mg total) by mouth daily., Disp: 90 tablet, Rfl: 1 .  Blood Glucose Monitoring Suppl (ONETOUCH VERIO) w/Device KIT, 1 Device by Does not apply route once., Disp: 1 kit, Rfl: 0 .  calcium carbonate 200 MG capsule, Take 200 mg by mouth 2 (two) times daily with a  meal. With Vitamin D3, Disp: , Rfl:  .  cholecalciferol (VITAMIN D) 1000 UNITS tablet, Take 1,000 Units by mouth daily., Disp: , Rfl:  .  Ferrous Sulfate (IRON) 325 (65 Fe) MG TABS, Take 1 tablet by mouth daily., Disp: , Rfl:  .  fluticasone (FLONASE) 50 MCG/ACT nasal spray, Place 2 sprays into both nostrils daily., Disp: 16 g, Rfl: 2 .  glucose blood (ONETOUCH VERIO) test strip, Use as instructed, Disp: 100 each, Rfl: 12 .  Insulin Glargine (LANTUS SOLOSTAR) 100 UNIT/ML Solostar Pen, Inject 8 Units into the skin daily at 10 pm. (Patient taking differently: Inject 8 Units into the skin daily before breakfast. ), Disp: 5 pen, Rfl: PRN .  Lancets (ONETOUCH ULTRASOFT) lancets, Use as instructed, Disp: 100 each, Rfl: 3 .  rosuvastatin (CRESTOR) 5 MG tablet, TAKE 1 TABLET BY MOUTH EVERY DAY, Disp: 90 tablet, Rfl: 0 .  triamterene-hydrochlorothiazide (MAXZIDE-25) 37.5-25 MG tablet, Take 1 tablet by mouth daily., Disp: 90 tablet, Rfl: 1 .  VICTOZA 18 MG/3ML SOPN, Inject 0.3 mLs (1.8 mg total) into the skin daily., Disp: 5 pen, Rfl: 2 .  vitamin B-12 (CYANOCOBALAMIN) 1000 MCG tablet, Take 1 tablet by mouth 2 (two) times a week., Disp: , Rfl:  .  budesonide-formoterol (SYMBICORT) 80-4.5 MCG/ACT inhaler, Inhale 2 puffs into the lungs 2 (two) times daily. (Patient not taking: Reported on 11/03/2018), Disp: 1 Inhaler, Rfl: 1 .  ibuprofen (ADVIL,MOTRIN) 800 MG tablet, Take 1 tablet (800 mg total) by mouth every 8 (eight)   hours as needed. (Patient not taking: Reported on 11/03/2018), Disp: 30 tablet, Rfl: 0 .  lubiprostone (AMITIZA) 24 MCG capsule, Take 1 capsule (24 mcg total) by mouth 2 (two) times daily with a meal. (Patient not taking: Reported on 11/03/2018), Disp: 180 capsule, Rfl: 1 .  polyethylene glycol powder (GLYCOLAX/MIRALAX) powder, Take 17 g by mouth 2 (two) times daily as needed. (Patient not taking: Reported on 11/03/2018), Disp: 3350 g, Rfl: 1 .  ranitidine (ZANTAC) 150 MG tablet, Take 1 tablet  (150 mg total) by mouth 2 (two) times daily. (Patient not taking: Reported on 11/03/2018), Disp: 180 tablet, Rfl: 1 .  SUMAtriptan (IMITREX) 50 MG tablet, TAKE 1 TABLET BY MOUTH EVERY 2 HOURS AS NEEDED FOR MIGRAINE-MAY REPEAT IN 2 HOURS IF HEADACHE PERSIS (Patient not taking: Reported on 11/03/2018), Disp: 10 tablet, Rfl: 0  Allergies  Allergen Reactions  . Levaquin [Levofloxacin In D5w] Swelling    Other reaction(s): Arthralgia (Joint Pain)  . Losartan     Other reaction(s): Angioedema  . Amlodipine-Olmesartan   . Codeine   . Gabapentin     cns side effects.  . Glipizide     Other reaction(s): Abdominal pain  . Invokana [Canagliflozin] Other (See Comments)    Bladder pain  . Lisinopril Swelling    Angioedema  . Metformin And Related Nausea Only  . Onglyza [Saxagliptin] Other (See Comments)    Increased PVC's  . Tramadol Nausea And Vomiting  . Actos [Pioglitazone] Other (See Comments) and Nausea And Vomiting    Bladder pain  . Atorvastatin Other (See Comments)  . Carvedilol Other (See Comments)    NUMBNESS, TINGLING, ACHING IN ARMS  . Prednisone Palpitations     ROS  Constitutional: Negative for fever or weight change.  Respiratory: Negative for cough and shortness of breath.   Cardiovascular: Negative for chest pain or palpitations.  Gastrointestinal: Negative for abdominal pain, no bowel changes.  Musculoskeletal: Negative for gait problem or joint swelling.  Skin: Negative for rash.  Neurological: Negative for dizziness or headache.  No other specific complaints in a complete review of systems (except as listed in HPI above).  Objective  Vitals:   11/03/18 0949  BP: 118/72  Pulse: 84  Resp: 16  Temp: 98.2 F (36.8 C)  TempSrc: Oral  SpO2: 96%  Weight: 204 lb (92.5 kg)  Height: 5' 6" (1.676 m)    Body mass index is 32.93 kg/m.  Physical Exam  Constitutional: Patient appears well-developed and well-nourished. No distress.  HENT: Head: Normocephalic and  atraumatic. Ears: B TMs ok, no erythema or effusion; Nose: Nose normal. Mouth/Throat: Oropharynx is clear and moist. No oropharyngeal exudate.  Eyes: Conjunctivae and EOM are normal. Pupils are equal, round, and reactive to light. No scleral icterus.  Neck: Normal range of motion. Neck supple. No JVD present. No thyromegaly present.  Cardiovascular: Normal rate, regular rhythm and normal heart sounds.  No murmur heard. No BLE edema. Pulmonary/Chest: Effort normal and breath sounds normal. No respiratory distress. Abdominal: Soft. Bowel sounds are normal, no distension. There is no tenderness. no masses Breast: no lumps or masses, no nipple discharge or rashes FEMALE GENITALIA: Deferred Musculoskeletal: Normal range of motion, no joint effusions. No gross deformities Neurological: he is alert and oriented to person, place, and time. No cranial nerve deficit. Coordination, balance, strength, speech and gait are normal.  Skin: Skin is warm and dry. No rash noted. No erythema.  Psychiatric: Patient has a normal mood and affect. behavior is normal. Judgment   and thought content normal.  No results found for this or any previous visit (from the past 2160 hour(s)).  PHQ2/9: Depression screen [facility] 2/9 11/03/2018 12/21/2017 09/14/2017 06/17/2017 03/23/2017  Decreased Interest 0 0 0 0 0  Down, Depressed, Hopeless 0 0 0 0 0  PHQ - 2 Score 0 0 0 0 0  Altered sleeping 0 - - - -  Tired, decreased energy 0 - - - -  Change in appetite 0 - - - -  Feeling bad or failure about yourself  0 - - - -  Trouble concentrating 0 - - - -  Moving slowly or fidgety/restless 0 - - - -  Suicidal thoughts 0 - - - -  PHQ-9 Score 0 - - - -  Difficult doing work/chores Not difficult at all - - - -   Fall Risk: Fall Risk  11/03/2018 12/21/2017 09/14/2017 06/17/2017 03/23/2017  Falls in the past year? 0 No No No No   Assessment & Plan  1. Well woman exam (no gynecological exam) -USPSTF grade A and B recommendations reviewed with  patient; age-appropriate recommendations, preventive care, screening tests, etc discussed and encouraged; healthy living encouraged; see AVS for patient education given to patient -Discussed importance of 150 minutes of physical activity weekly, eat two servings of fish weekly, eat one serving of tree nuts ( cashews, pistachios, pecans, almonds.Marland Kitchen) every other day, eat 6 servings of fruit/vegetables daily and drink plenty of water and avoid sweet beverages.   2. Hyperlipidemia, unspecified hyperlipidemia type - Lipid panel  3. Vitamin D deficiency - Vitamin D (25 hydroxy)  4. Essential hypertension - COMPLETE METABOLIC PANEL WITH GFR  5. Type 2 diabetes mellitus with hyperglycemia, with long-term current use of insulin (HCC) - Keep follow up with Dr. Manfred Shirts  6. 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  7. History of breast cancer in female - Keep follow up with Surgeon.

## 2018-11-03 NOTE — Patient Instructions (Signed)
Preventive Care 65 Years and Older, Female Preventive care refers to lifestyle choices and visits with your health care provider that can promote health and wellness. What does preventive care include?  A yearly physical exam. This is also called an annual well check.  Dental exams once or twice a year.  Routine eye exams. Ask your health care provider how often you should have your eyes checked.  Personal lifestyle choices, including: ? Daily care of your teeth and gums. ? Regular physical activity. ? Eating a healthy diet. ? Avoiding tobacco and drug use. ? Limiting alcohol use. ? Practicing safe sex. ? Taking low-dose aspirin every day. ? Taking vitamin and mineral supplements as recommended by your health care provider. What happens during an annual well check? The services and screenings done by your health care provider during your annual well check will depend on your age, overall health, lifestyle risk factors, and family history of disease. Counseling Your health care provider may ask you questions about your:  Alcohol use.  Tobacco use.  Drug use.  Emotional well-being.  Home and relationship well-being.  Sexual activity.  Eating habits.  History of falls.  Memory and ability to understand (cognition).  Work and work environment.  Reproductive health.  Screening You may have the following tests or measurements:  Height, weight, and BMI.  Blood pressure.  Lipid and cholesterol levels. These may be checked every 5 years, or more frequently if you are over 50 years old.  Skin check.  Lung cancer screening. You may have this screening every year starting at age 55 if you have a 30-pack-year history of smoking and currently smoke or have quit within the past 15 years.  Fecal occult blood test (FOBT) of the stool. You may have this test every year starting at age 50.  Flexible sigmoidoscopy or colonoscopy. You may have a sigmoidoscopy every 5 years or  a colonoscopy every 10 years starting at age 50.  Hepatitis C blood test.  Hepatitis B blood test.  Sexually transmitted disease (STD) testing.  Diabetes screening. This is done by checking your blood sugar (glucose) after you have not eaten for a while (fasting). You may have this done every 1-3 years.  Bone density scan. This is done to screen for osteoporosis. You may have this done starting at age 65.  Mammogram. This may be done every 1-2 years. Talk to your health care provider about how often you should have regular mammograms.  Talk with your health care provider about your test results, treatment options, and if necessary, the need for more tests. Vaccines Your health care provider may recommend certain vaccines, such as:  Influenza vaccine. This is recommended every year.  Tetanus, diphtheria, and acellular pertussis (Tdap, Td) vaccine. You may need a Td booster every 10 years.  Varicella vaccine. You may need this if you have not been vaccinated.  Zoster vaccine. You may need this after age 60.  Measles, mumps, and rubella (MMR) vaccine. You may need at least one dose of MMR if you were born in 1957 or later. You may also need a second dose.  Pneumococcal 13-valent conjugate (PCV13) vaccine. One dose is recommended after age 65.  Pneumococcal polysaccharide (PPSV23) vaccine. One dose is recommended after age 65.  Meningococcal vaccine. You may need this if you have certain conditions.  Hepatitis A vaccine. You may need this if you have certain conditions or if you travel or work in places where you may be exposed to hepatitis   A.  Hepatitis B vaccine. You may need this if you have certain conditions or if you travel or work in places where you may be exposed to hepatitis B.  Haemophilus influenzae type b (Hib) vaccine. You may need this if you have certain conditions.  Talk to your health care provider about which screenings and vaccines you need and how often you  need them. This information is not intended to replace advice given to you by your health care provider. Make sure you discuss any questions you have with your health care provider. Document Released: 01/02/2016 Document Revised: 08/25/2016 Document Reviewed: 10/07/2015 Elsevier Interactive Patient Education  2018 [employer] Inc.  

## 2018-11-04 LAB — LIPID PANEL
Cholesterol: 155 mg/dL (ref <200)
HDL: 52 mg/dL (ref >50)
LDL CHOLESTEROL (CALC): 86 mg/dL
Non-HDL Cholesterol (Calc): 103 mg/dL (calc) (ref <130)
Total CHOL/HDL Ratio: 3 (calc) (ref <5.0)
Triglycerides: 76 mg/dL (ref <150)

## 2018-11-04 LAB — COMPLETE METABOLIC PANEL WITH GFR
AG RATIO: 1.2 (calc) (ref 1.0–2.5)
ALT: 10 U/L (ref 6–29)
AST: 15 U/L (ref 10–35)
Albumin: 3.8 g/dL (ref 3.6–5.1)
Alkaline phosphatase (APISO): 88 U/L (ref 33–130)
BILIRUBIN TOTAL: 0.4 mg/dL (ref 0.2–1.2)
BUN: 10 mg/dL (ref 7–25)
CHLORIDE: 103 mmol/L (ref 98–110)
CO2: 33 mmol/L — ABNORMAL HIGH (ref 20–32)
Calcium: 9.2 mg/dL (ref 8.6–10.4)
Creat: 0.86 mg/dL (ref 0.50–0.99)
GFR, EST AFRICAN AMERICAN: 81 mL/min/{1.73_m2} (ref >=60)
GFR, Est Non African American: 70 mL/min/{1.73_m2} (ref >=60)
GLUCOSE: 71 mg/dL (ref 65–139)
Globulin: 3.1 g/dL (calc) (ref 1.9–3.7)
Potassium: 3.6 mmol/L (ref 3.5–5.3)
Sodium: 141 mmol/L (ref 135–146)
TOTAL PROTEIN: 6.9 g/dL (ref 6.1–8.1)

## 2018-11-04 LAB — VITAMIN D 25 HYDROXY (VIT D DEFICIENCY, FRACTURES): Vit D, 25-Hydroxy: 22 ng/mL — ABNORMAL LOW (ref 30–100)

## 2018-11-06 ENCOUNTER — Other Ambulatory Visit: Payer: Self-pay | Admitting: Family Medicine

## 2018-11-06 DIAGNOSIS — E785 Hyperlipidemia, unspecified: Principal | ICD-10-CM

## 2018-11-06 DIAGNOSIS — E1169 Type 2 diabetes mellitus with other specified complication: Secondary | ICD-10-CM

## 2018-11-06 NOTE — Telephone Encounter (Signed)
Refill Request for Cholesterol medication. [facility].   Last physical: 11/03/2018   Lab Results  Component Value Date   CHOL 155 11/03/2018   HDL 52 11/03/2018   LDLCALC 86 11/03/2018   TRIG 76 11/03/2018   CHOLHDL 3.0 11/03/2018    Follow up on 01/03/2019

## 2018-11-15 DIAGNOSIS — Z794 Long term (current) use of insulin: Secondary | ICD-10-CM | POA: Diagnosis not present

## 2018-11-15 DIAGNOSIS — E1159 Type 2 diabetes mellitus with other circulatory complications: Secondary | ICD-10-CM | POA: Diagnosis not present

## 2018-11-15 DIAGNOSIS — E1169 Type 2 diabetes mellitus with other specified complication: Secondary | ICD-10-CM | POA: Diagnosis not present

## 2018-11-15 DIAGNOSIS — E785 Hyperlipidemia, unspecified: Secondary | ICD-10-CM | POA: Diagnosis not present

## 2018-11-15 DIAGNOSIS — I10 Essential (primary) hypertension: Secondary | ICD-10-CM | POA: Diagnosis not present

## 2018-11-15 DIAGNOSIS — E119 Type 2 diabetes mellitus without complications: Secondary | ICD-10-CM | POA: Diagnosis not present

## 2018-11-15 LAB — HEMOGLOBIN A1C: Hemoglobin A1C: 6.6

## 2018-11-21 ENCOUNTER — Other Ambulatory Visit: Payer: Self-pay | Admitting: Family Medicine

## 2018-11-21 DIAGNOSIS — I10 Essential (primary) hypertension: Secondary | ICD-10-CM

## 2018-11-28 ENCOUNTER — Other Ambulatory Visit: Payer: Self-pay | Admitting: Family Medicine

## 2018-11-28 DIAGNOSIS — E1169 Type 2 diabetes mellitus with other specified complication: Secondary | ICD-10-CM

## 2018-11-28 DIAGNOSIS — E785 Hyperlipidemia, unspecified: Principal | ICD-10-CM

## 2018-11-28 NOTE — Telephone Encounter (Signed)
Copied from [place] [phone]. Topic: Quick Communication - Rx Refill/Question >> Nov 28, 2018 11:52 AM Virl Axe D wrote: Medication: rosuvastatin (CRESTOR) 5 MG tablet / Pt stated pharmacy could not find her rx for refill.  Has the patient contacted their pharmacy? Yes.   (Agent: If no, request that the patient contact the pharmacy for the refill.) (Agent: If yes, when and what did the pharmacy advise?)  Preferred Pharmacy (with phone number or street name): [id number] - [place], [address] [phone] (Phone) [phone] (Fax)    Agent: Please be advised that RX refills may take up to 3 business days. We ask that you follow-up with your pharmacy.

## 2018-12-03 MED ORDER — ROSUVASTATIN CALCIUM 5 MG PO TABS: 5.0000 mg | ORAL_TABLET | Freq: Every day | ORAL | 0 refills | Status: DC

## 2018-12-05 ENCOUNTER — Encounter: Payer: Self-pay | Admitting: Nurse Practitioner

## 2018-12-05 ENCOUNTER — Ambulatory Visit (INDEPENDENT_AMBULATORY_CARE_PROVIDER_SITE_OTHER): Payer: PPO | Admitting: Nurse Practitioner

## 2018-12-05 VITALS — BP 122/70 | HR 74 | Temp 98.1°F | Resp 16 | Ht 66.0 in | Wt 206.4 lb

## 2018-12-05 DIAGNOSIS — J3489 Other specified disorders of nose and nasal sinuses: Secondary | ICD-10-CM

## 2018-12-05 DIAGNOSIS — R6883 Chills (without fever): Secondary | ICD-10-CM | POA: Diagnosis not present

## 2018-12-05 DIAGNOSIS — H9209 Otalgia, unspecified ear: Secondary | ICD-10-CM | POA: Diagnosis not present

## 2018-12-05 DIAGNOSIS — J029 Acute pharyngitis, unspecified: Secondary | ICD-10-CM | POA: Diagnosis not present

## 2018-12-05 DIAGNOSIS — J0100 Acute maxillary sinusitis, unspecified: Secondary | ICD-10-CM | POA: Diagnosis not present

## 2018-12-05 MED ORDER — AMOXICILLIN-POT CLAVULANATE 875-125 MG PO TABS: 1.0000 | ORAL_TABLET | Freq: Two times a day (BID) | ORAL | 0 refills | Status: DC

## 2018-12-05 NOTE — Patient Instructions (Addendum)

## 2018-12-05 NOTE — Progress Notes (Signed)
Name: Morgan Peterson   MRN: [id number]    DOB: 03/19/1951   Date:12/05/2018       Progress Note  Subjective  Chief Complaint  Chief Complaint  Patient presents with  . Sinusitis    HPI  Patient endorses intermittent cough for 3 weeks, and this week has had left sided ear aches, left sided facial fullness, and nasal congestion. Patient endorses headaches and fullness.  Patient has tried Claritin, drinking lots of liquids and extra strength tylenol   Patient Active Problem List   Diagnosis Date Noted  . Chronic low back pain without sciatica 08/18/2017  . Irritable bowel syndrome with constipation 05/02/2017  . Vitamin D deficiency 09/09/2016  . Hiatal hernia 06/02/2016  . Allergic rhinitis, seasonal 06/02/2016  . Obesity (BMI 30-39.9) 04/30/2016  . Angio-edema 04/30/2016  . Hyperlipidemia 11/04/2015  . Hyperlipidemia due to type 2 diabetes mellitus ([place]) 11/04/2015  . Cervical radiculopathy at C6 07/31/2015  . Neuroma digital nerve 07/31/2015  . Type 2 diabetes mellitus with hyperglycemia, with long-term current use of insulin ([place]) 06/30/2015  . Right shoulder pain 06/30/2015  . Cervical disc disease 06/30/2015  . Essential hypertension 06/30/2015  . History of breast cancer in female 03/23/2009    Past Medical History:  Diagnosis Date  . Cancer [facility]) 2010   Right Breast  . Diabetes mellitus without complication ([place]) 3729  . Hypertension   . IBS (irritable bowel syndrome)   . 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 Arimidex therapy completed September 2015  . Personal history of malignant neoplasm of breast 2010    Past Surgical History:  Procedure Laterality Date  . ABDOMINAL EXPLORATION SURGERY  2000  . BREAST BIOPSY Right 2010   +   . BREAST EXCISIONAL BIOPSY Right 2010   + mammo  site inasive mammo ca  . BREAST LUMPECTOMY Right 2010  . COLONOSCOPY  07/19/2012   Normal exam, Dr. Bary Castilla  . TUBAL LIGATION  20 years ago    Social History   Tobacco Use  . Smoking status: Former Smoker    Packs/day: 0.25    Years: 10.00    Pack years: 2.50    Types: Cigarettes  . Smokeless tobacco: Never Used  Substance Use Topics  . Alcohol use: Yes    Alcohol/week: 0.0 standard drinks    Comment: wine     Current Outpatient Medications:  .  aspirin EC 81 MG tablet, Take 1 tablet (81 mg total) by mouth daily., Disp: 30 tablet, Rfl: 0 .  atenolol (TENORMIN) 25 MG tablet, TAKE 1 TABLET BY MOUTH EVERY DAY, Disp: 90 tablet, Rfl: 0 .  Blood Glucose Monitoring Suppl (ONETOUCH VERIO) w/Device KIT, 1 Device by Does not apply route once., Disp: 1 kit, Rfl: 0 .  calcium carbonate 200 MG capsule, Take 200 mg by mouth 2 (two) times daily with a meal. With Vitamin D3, Disp: , Rfl:  .  cholecalciferol (VITAMIN D) 1000 UNITS tablet, Take 1,000 Units by mouth daily., Disp: , Rfl:  .  Ferrous Sulfate (IRON) 325 (65 Fe) MG TABS, Take 1 tablet by mouth daily., Disp: , Rfl:  .  fluticasone (FLONASE) 50 MCG/ACT nasal spray, Place 2 sprays into both nostrils daily., Disp: 16 g, Rfl: 2 .  glucose blood (ONETOUCH VERIO) test strip, Use as instructed, Disp: 100  each, Rfl: 12 .  Lancets (ONETOUCH ULTRASOFT) lancets, Use as instructed, Disp: 100 each, Rfl: 3 .  polyethylene glycol powder (GLYCOLAX/MIRALAX) powder, Take 17 g by mouth 2 (two) times daily as needed., Disp: 3350 g, Rfl: 1 .  rosuvastatin (CRESTOR) 5 MG tablet, Take 1 tablet (5 mg total) by mouth daily., Disp: 90 tablet, Rfl: 0 .  triamterene-hydrochlorothiazide (MAXZIDE-25) 37.5-25 MG tablet, Take 1 tablet by mouth daily., Disp: 90 tablet, Rfl: 1 .  VICTOZA 18 MG/3ML SOPN, Inject 0.3 mLs (1.8 mg total) into the skin daily., Disp: 5 pen, Rfl: 2 .  vitamin B-12 (CYANOCOBALAMIN) 1000 MCG tablet, Take 1 tablet by mouth 2 (two) times a week.,  Disp: , Rfl:   Allergies  Allergen Reactions  . Levaquin [Levofloxacin In D5w] Swelling    Other reaction(s): Arthralgia (Joint Pain)  . Losartan     Other reaction(s): Angioedema  . Amlodipine-Olmesartan   . Codeine   . Gabapentin     cns side effects.  . Glipizide     Other reaction(s): Abdominal pain  . Invokana [Canagliflozin] Other (See Comments)    Bladder pain  . Lisinopril Swelling    Angioedema  . Metformin And Related Nausea Only  . Onglyza [Saxagliptin] Other (See Comments)    Increased PVC's  . Tramadol Nausea And Vomiting  . Actos [Pioglitazone] Other (See Comments) and Nausea And Vomiting    Bladder pain  . Atorvastatin Other (See Comments)  . Carvedilol Other (See Comments)    NUMBNESS, TINGLING, ACHING IN ARMS  . Prednisone Palpitations    ROS   No other specific complaints in a complete review of systems (except as listed in HPI above).  Objective  Vitals:   12/05/18 0954  BP: 122/70  Pulse: 74  Resp: 16  Temp: 98.1 F (36.7 C)  TempSrc: Oral  SpO2: 97%  Weight: 206 lb 6.4 oz (93.6 kg)  Height: '5\' 6"'  (1.676 m)    Body mass index is 33.31 kg/m.  Nursing Note and Vital Signs reviewed.  Physical Exam HENT:     Head: Normocephalic and atraumatic.     Right Ear: Hearing, tympanic membrane, ear canal and external ear normal.     Left Ear: Hearing and external ear normal. A middle ear effusion is present.     Nose: Congestion present.     Right Sinus: No maxillary sinus tenderness or frontal sinus tenderness.     Left Sinus: Maxillary sinus tenderness and frontal sinus tenderness present.     Mouth/Throat:     Lips: Pink.     Mouth: Mucous membranes are moist.     Pharynx: No pharyngeal swelling, oropharyngeal exudate, posterior oropharyngeal erythema or uvula swelling.  Eyes:     General:        Right eye: No discharge.        Left eye: No discharge.     Conjunctiva/sclera: Conjunctivae normal.  Neck:     Musculoskeletal: Normal  range of motion.  Cardiovascular:     Rate and Rhythm: Normal rate and regular rhythm.  Pulmonary:     Effort: Pulmonary effort is normal.     Breath sounds: Normal breath sounds.  Abdominal:     Palpations: Abdomen is soft.     Tenderness: There is no abdominal tenderness.  Lymphadenopathy:     Cervical: No cervical adenopathy.  Skin:    General: Skin is warm and dry.     Findings: No rash.  Neurological:     Mental  Status: She is alert.  Psychiatric:        Judgment: Judgment normal.      No results found for this or any previous visit (from the past 48 hour(s)).  Assessment & Plan   1. Acute non-recurrent maxillary sinusitis Discussed OTC management  - amoxicillin-clavulanate (AUGMENTIN) 875-125 MG tablet; Take 1 tablet by mouth 2 (two) times daily.  Dispense: 20 tablet; Refill: 0  2. Sore throat   3. Sinus pressure   4. Ear ache   5. Chills

## 2019-01-03 ENCOUNTER — Ambulatory Visit: Payer: PPO | Admitting: Family Medicine

## 2019-01-10 ENCOUNTER — Other Ambulatory Visit: Payer: Self-pay | Admitting: Family Medicine

## 2019-01-10 DIAGNOSIS — I10 Essential (primary) hypertension: Secondary | ICD-10-CM

## 2019-01-10 DIAGNOSIS — H1045 Other chronic allergic conjunctivitis: Secondary | ICD-10-CM | POA: Diagnosis not present

## 2019-01-10 MED ORDER — TRIAMTERENE-HCTZ 37.5-25 MG PO TABS: 1.0000 | ORAL_TABLET | Freq: Every day | ORAL | 0 refills | Status: DC

## 2019-01-10 NOTE — Telephone Encounter (Signed)
Copied from [place]. Topic: Quick Communication - Rx Refill/Question >> Jan 10, 2019  9:20 AM Yvette Rack wrote: Medication: triamterene-hydrochlorothiazide (MAXZIDE-25) 37.5-25 MG tablet  Has the patient contacted their pharmacy? Yes.   (Agent: If no, request that the patient contact the pharmacy for the refill.) (Agent: If yes, when and what did the pharmacy advise?) today to call provider  Preferred Pharmacy (with phone number or street name):   [id number] - [place], [address] [phone] (Phone) [phone] (Fax)    Agent: Please be advised that RX refills may take up to 3 business days. We ask that you follow-up with your pharmacy.

## 2019-01-10 NOTE — Telephone Encounter (Signed)
Please review for refill.  

## 2019-01-26 ENCOUNTER — Other Ambulatory Visit: Payer: Self-pay

## 2019-01-26 DIAGNOSIS — Z853 Personal history of malignant neoplasm of breast: Secondary | ICD-10-CM

## 2019-01-30 ENCOUNTER — Ambulatory Visit (INDEPENDENT_AMBULATORY_CARE_PROVIDER_SITE_OTHER): Payer: PPO | Admitting: Family Medicine

## 2019-01-30 ENCOUNTER — Encounter

## 2019-01-30 ENCOUNTER — Encounter: Payer: Self-pay | Admitting: Family Medicine

## 2019-01-30 VITALS — BP 130/80 | HR 85 | Resp 16 | Ht 66.0 in | Wt 206.8 lb

## 2019-01-30 DIAGNOSIS — J302 Other seasonal allergic rhinitis: Secondary | ICD-10-CM | POA: Diagnosis not present

## 2019-01-30 DIAGNOSIS — M79662 Pain in left lower leg: Secondary | ICD-10-CM

## 2019-01-30 DIAGNOSIS — M722 Plantar fascial fibromatosis: Secondary | ICD-10-CM

## 2019-01-30 DIAGNOSIS — E785 Hyperlipidemia, unspecified: Secondary | ICD-10-CM

## 2019-01-30 DIAGNOSIS — E1169 Type 2 diabetes mellitus with other specified complication: Secondary | ICD-10-CM

## 2019-01-30 DIAGNOSIS — M545 Low back pain, unspecified: Secondary | ICD-10-CM

## 2019-01-30 DIAGNOSIS — E559 Vitamin D deficiency, unspecified: Secondary | ICD-10-CM

## 2019-01-30 DIAGNOSIS — I10 Essential (primary) hypertension: Secondary | ICD-10-CM | POA: Diagnosis not present

## 2019-01-30 MED ORDER — TRIAMTERENE-HCTZ 37.5-25 MG PO TABS: 1.0000 | ORAL_TABLET | Freq: Every day | ORAL | 1 refills | Status: DC

## 2019-01-30 MED ORDER — ROSUVASTATIN CALCIUM 5 MG PO TABS: 5.0000 mg | ORAL_TABLET | Freq: Every day | ORAL | 1 refills | Status: DC

## 2019-01-30 MED ORDER — TIZANIDINE HCL 4 MG PO TABS: 4.0000 mg | ORAL_TABLET | Freq: Four times a day (QID) | ORAL | 0 refills | Status: DC | PRN

## 2019-01-30 MED ORDER — ATENOLOL 25 MG PO TABS: 25.0000 mg | ORAL_TABLET | Freq: Every day | ORAL | 1 refills | Status: DC

## 2019-01-30 MED ORDER — FLUTICASONE PROPIONATE 50 MCG/ACT NA SUSP: 2.0000 | Freq: Every day | NASAL | 2 refills | Status: DC

## 2019-01-30 NOTE — Progress Notes (Signed)
Name: Morgan Peterson   MRN: [id number]    DOB: 1951-04-22   Date:01/30/2019       Progress Note  Subjective  Chief Complaint  Chief Complaint  Patient presents with  . Hypertension  . Hyperlipidemia  . Leg Pain    Tenderness on left lower leg    HPI  HTN: bp is at goal today, she has been taking medication as prescribed Atenolol and Triamterene HCTZ,  denies chest pain or palpitation, no side effects. Allergic to ACE - angioedema.  DM:she is seeing  Endocrinologist Dr. Honor Junes now. She used to see  Dr. Lytle Michaels hgbA1C 13% April 2017 she was She was on Victoza and Lantus, but now on Ozempic only and is doing well.  Last hgbA1C was 6.6% Nov 2019 and negative urine micro 12/2017 we will recheck labs today. No polyphagia, no polydipsia or polyuria. She has been taking Crestor and tolerating it well now.  Fsbs at home has been well controlled low 100's. Up to date with eye exam and we will obtain records from Dr. Ellin Mayhew.   Migraine headaches: she is having occasional symptoms now, goes to a dark room when needed, also tried CBD oil and it helps.   GAD: she is not on SSRI, she has bee off BZD , no recent problems.   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 She states her bowel movements are a couple of times a week, last bowel movements 2 days ago, and Bristol scale is between Type 1 to Type 4 She is doing better eating prunes. Advised to avoid a lot of caffeine to improve symptoms of abdominal pain. She states pelvic pain she had last year resolved when she stopped holding her urine for a long time  Intermittent low back pain: doing better, had hot stone massage recently, pain was on left lower back and shooting down left leg, but now just has intermittent spasms, discussed stretching and we will give her a muscle relaxer.   Left plantar fascitis: she is wearing a brace on left foot, still goes bare foot at home, pain is 6/10 right now, worse when  walking, discussed stretching exercises and ice it, not to wear flats, including no boots. She has some left medial low leg pain it may be from antalgic gait, advised to massage and use biofreeze  Patient Active Problem List   Diagnosis Date Noted  . Chronic low back pain without sciatica 08/18/2017  . Irritable bowel syndrome with constipation 05/02/2017  . Vitamin D deficiency 09/09/2016  . Hiatal hernia 06/02/2016  . Allergic rhinitis, seasonal 06/02/2016  . Obesity (BMI 30-39.9) 04/30/2016  . Angio-edema 04/30/2016  . Hyperlipidemia 11/04/2015  . Hyperlipidemia due to type 2 diabetes mellitus ([place]) 11/04/2015  . Cervical radiculopathy at C6 07/31/2015  . Neuroma digital nerve 07/31/2015  . Type 2 diabetes mellitus with hyperglycemia, with long-term current use of insulin ([place]) 06/30/2015  . Right shoulder pain 06/30/2015  . Cervical disc disease 06/30/2015  . Essential hypertension 06/30/2015  . History of breast cancer in female 03/23/2009    Past Surgical History:  Procedure Laterality Date  . ABDOMINAL EXPLORATION SURGERY  2000  . BREAST BIOPSY Right 2010   +   . BREAST EXCISIONAL BIOPSY Right 2010   + mammo site inasive mammo ca  . BREAST LUMPECTOMY Right 2010  . COLONOSCOPY  07/19/2012   Normal exam, Dr. Bary Castilla  . TUBAL LIGATION  20 years ago    Family History  Problem Relation Age of Onset  . Hypertension Mother   . Hyperlipidemia Mother   . Diabetes Father   . Kidney disease Father   . Diabetes Brother   . Colon cancer Maternal Uncle   . Breast cancer Maternal Aunt     Social History   Socioeconomic History  . Marital status: Married    Spouse name: Antonio  . Number of children: 2  . Years of education: Not on file  . Highest education level: Not on file  Occupational History  . 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 Financial resource strain: Not hard at all  . Food insecurity:    Worry: Never true    Inability:  Never true  . Transportation needs:    Medical: No    Non-medical: No  Tobacco Use  . Smoking status: Former Smoker    Packs/day: 0.25    Years: 10.00    Pack years: 2.50    Types: Cigarettes  . Smokeless tobacco: Never Used  Substance and Sexual Activity  . Alcohol use: Yes    Alcohol/week: 0.0 standard drinks    Comment: wine  . Drug use: No  . Sexual activity: Not Currently    Comment: husband has ED  Lifestyle  . Physical activity:    Days per week: 0 days    Minutes per session: 0 min  . Stress: To some extent  Relationships  . Social connections:    Talks on phone: More than three times a week    Gets together: More than three times a week    Attends religious service: More than 4 times per year    Active member of club or organization: Yes    Attends meetings of clubs or organizations: More than 4 times per year    Relationship status: Married  . Intimate partner violence:    Fear of current or ex partner: No    Emotionally abused: No    Physically abused: No    Forced sexual activity: No  Other Topics Concern  . Not on file  Social History Narrative  . Not on file     Current Outpatient Medications:  .  aspirin EC 81 MG tablet, Take 1 tablet (81 mg total) by mouth daily., Disp: 30 tablet, Rfl: 0 .  atenolol (TENORMIN) 25 MG tablet, Take 1 tablet (25 mg total) by mouth daily., Disp: 90 tablet, Rfl: 1 .  Blood Glucose Monitoring Suppl (ONETOUCH VERIO) w/Device KIT, 1 Device by Does not apply route once., Disp: 1 kit, Rfl: 0 .  calcium carbonate 200 MG capsule, Take 200 mg by mouth 2 (two) times daily with a meal. With Vitamin D3, Disp: , Rfl:  .  cholecalciferol (VITAMIN D) 1000 UNITS tablet, Take 1,000 Units by mouth daily., Disp: , Rfl:  .  fluticasone (FLONASE) 50 MCG/ACT nasal spray, Place 2 sprays into both nostrils daily., Disp: 16 g, Rfl: 2 .  glucose blood (ONETOUCH VERIO) test strip, Use as instructed, Disp: 100 each, Rfl: 12 .  Lancets (ONETOUCH  ULTRASOFT) lancets, Use as instructed, Disp: 100 each, Rfl: 3 .  OZEMPIC, 0.25 OR 0.5 MG/DOSE, 2 MG/1.5ML SOPN, Inject 0.5 mg into the skin once a week., Disp: , Rfl:  .  rosuvastatin (CRESTOR) 5 MG tablet, Take 1 tablet (5 mg total) by mouth daily., Disp: 90 tablet, Rfl: 1 .  triamterene-hydrochlorothiazide (MAXZIDE-25) 37.5-25 MG tablet, Take 1 tablet by mouth daily., Disp: 90 tablet,  Rfl: 1 .  vitamin B-12 (CYANOCOBALAMIN) 1000 MCG tablet, Take 1 tablet by mouth 2 (two) times a week., Disp: , Rfl:  .  tiZANidine (ZANAFLEX) 4 MG tablet, Take 1 tablet (4 mg total) by mouth every 6 (six) hours as needed for muscle spasms., Disp: 30 tablet, Rfl: 0  Allergies  Allergen Reactions  . Levaquin [Levofloxacin In D5w] Swelling    Other reaction(s): Arthralgia (Joint Pain)  . Losartan     Other reaction(s): Angioedema  . Amlodipine-Olmesartan   . Codeine   . Gabapentin     cns side effects.  . Glipizide     Other reaction(s): Abdominal pain  . Invokana [Canagliflozin] Other (See Comments)    Bladder pain  . Lisinopril Swelling    Angioedema  . Metformin And Related Nausea Only  . Onglyza [Saxagliptin] Other (See Comments)    Increased PVC's  . Tramadol Nausea And Vomiting  . Actos [Pioglitazone] Other (See Comments) and Nausea And Vomiting    Bladder pain  . Atorvastatin Other (See Comments)  . Carvedilol Other (See Comments)    NUMBNESS, TINGLING, ACHING IN ARMS  . Prednisone Palpitations    I personally reviewed active problem list, medication list, allergies, family history, social history with the patient/caregiver today.   ROS  Constitutional: Negative for fever or weight change.  Respiratory: positive  For mild  cough - is resolving now from recent URI, but no  shortness of breath.   Cardiovascular: Negative for chest pain she has occasional  palpitations.  Gastrointestinal: Negative for abdominal pain, no bowel changes.  Musculoskeletal: Negative for gait problem or joint  swelling.  Skin: Negative for rash.  Neurological: Negative for dizziness she very seldom has headache.  No other specific complaints in a complete review of systems (except as listed in HPI above).  Objective  Vitals:   01/30/19 0915  BP: 130/80  Pulse: 85  Resp: 16  SpO2: 97%  Weight: 206 lb 12.8 oz (93.8 kg)  Height: '5\' 6"'  (1.676 m)    Body mass index is 33.38 kg/m.  Physical Exam  Constitutional: Patient appears well-developed and well-nourished. Obese  No distress.  HEENT: head atraumatic, normocephalic, pupils equal and reactive to light, neck supple, throat within normal limits Cardiovascular: Normal rate, regular rhythm and normal heart sounds.  No murmur heard. No BLE edema. Pulmonary/Chest: Effort normal and breath sounds normal. No respiratory distress. Abdominal: Soft.  There is no tenderness. Muscular skeletal: normal lower back exam, negative straight leg raise  Psychiatric: Patient has a normal mood and affect. behavior is normal. Judgment and thought content normal.  Recent Results (from the past 2160 hour(s))  Vitamin D (25 hydroxy)     Status: Abnormal   Collection Time: 11/03/18 10:30 AM  Result Value Ref Range   Vit D, 25-Hydroxy 22 (L) 30 - 100 ng/mL    Comment: Vitamin D Status         25-OH Vitamin D: . Deficiency:                    <20 ng/mL Insufficiency:             20 - 29 ng/mL Optimal:                 > or = 30 ng/mL . For 25-OH Vitamin D testing on patients on  D2-supplementation and patients for whom quantitation  of D2 and D3 fractions is required, the QuestAssureD(TM) 25-OH VIT D, (D2,D3), LC/MS/MS is  recommended: order  code 929 [id number] 3092 (patients >61yr). . For more information on this test, go to: http://education.questdiagnostics.com/faq/FAQ163 (This link is being provided for  informational/educational purposes only.)   COMPLETE METABOLIC PANEL WITH GFR     Status: Abnormal   Collection Time: 11/03/18 10:30 AM  Result Value Ref  Range   Glucose, Bld 71 65 - 139 mg/dL    Comment: .        Non-fasting reference interval .    BUN 10 7 - 25 mg/dL   Creat 0.86 0.50 - 0.99 mg/dL    Comment: For patients >447years of age, the reference limit for Creatinine is approximately 13% higher for people identified as African-American. .    GFR, Est Non African American 70 > OR = 60 mL/min/1.761m  GFR, Est African American 81 > OR = 60 mL/min/1.7377m BUN/Creatinine Ratio NOT APPLICABLE 6 - 22 (calc)   Sodium 141 135 - 146 mmol/L   Potassium 3.6 3.5 - 5.3 mmol/L   Chloride 103 98 - 110 mmol/L   CO2 33 (H) 20 - 32 mmol/L   Calcium 9.2 8.6 - 10.4 mg/dL   Total Protein 6.9 6.1 - 8.1 g/dL   Albumin 3.8 3.6 - 5.1 g/dL   Globulin 3.1 1.9 - 3.7 g/dL (calc)   AG Ratio 1.2 1.0 - 2.5 (calc)   Total Bilirubin 0.4 0.2 - 1.2 mg/dL   Alkaline phosphatase (APISO) 88 33 - 130 U/L   AST 15 10 - 35 U/L   ALT 10 6 - 29 U/L  Lipid panel     Status: None   Collection Time: 11/03/18 10:30 AM  Result Value Ref Range   Cholesterol 155 <200 mg/dL   HDL 52 >50 mg/dL   Triglycerides 76 <150 mg/dL   LDL Cholesterol (Calc) 86 mg/dL (calc)    Comment: Reference range: <100 . Desirable range <100 mg/dL for primary prevention;   <70 mg/dL for patients with CHD or diabetic patients  with > or = 2 CHD risk factors. . LMarland KitchenL-C is now calculated using the Martin-Hopkins  calculation, which is a validated novel method providing  better accuracy than the Friedewald equation in the  estimation of LDL-C.  MarCresenciano Genre al. JAMAnnamaria [id number];[id number]  (http://education.[facility].com/faq/FAQ164)    Total CHOL/HDL Ratio 3.0 <5.0 (calc)   Non-HDL Cholesterol (Calc) 103 <130 mg/dL (calc)    Comment: For patients with diabetes plus 1 major ASCVD risk  factor, treating to a non-HDL-C goal of <100 mg/dL  (LDL-C of <70 mg/dL) is considered a therapeutic  option.   Hemoglobin A1c     Status: None   Collection Time: 11/15/18 12:00 AM  Result  Value Ref Range   Hemoglobin A1C 6.6       PHQ2/9: [facility] screen [facility] 01/30/2019 12/05/2018 11/03/2018 12/21/2017 09/14/2017  Decreased Interest 0 0 0 0 0  Down, Depressed, Hopeless 0 0 0 0 0  PHQ - 2 Score 0 0 0 0 0  Altered sleeping - 0 0 - -  Tired, decreased energy - 0 0 - -  Change in appetite - 0 0 - -  Feeling bad or failure about yourself  - 0 0 - -  Trouble concentrating - 0 0 - -  Moving slowly or fidgety/restless - 0 0 - -  Suicidal thoughts - 0 0 - -  PHQ-9 Score - 0 0 - -  Difficult doing work/chores - Not difficult at all Not difficult at all - -  Fall Risk: Fall Risk  01/30/2019 12/05/2018 11/03/2018 12/21/2017 09/14/2017  Falls in the past year? 0 0 0 No No  Number falls in past yr: 0 0 - - -  Injury with Fall? 0 0 - - -     Assessment & Plan   1. Dyslipidemia associated with type 2 diabetes mellitus (HCC)  - rosuvastatin (CRESTOR) 5 MG tablet; Take 1 tablet (5 mg total) by mouth daily.  Dispense: 90 tablet; Refill: 1 - Microalbumin / creatinine urine ratio  2. Essential hypertension  - atenolol (TENORMIN) 25 MG tablet; Take 1 tablet (25 mg total) by mouth daily.  Dispense: 90 tablet; Refill: 1 - triamterene-hydrochlorothiazide (MAXZIDE-25) 37.5-25 MG tablet; Take 1 tablet by mouth daily.  Dispense: 90 tablet; Refill: 1  3. Seasonal allergic rhinitis, unspecified trigger  - fluticasone (FLONASE) 50 MCG/ACT nasal spray; Place 2 sprays into both nostrils daily.  Dispense: 16 g; Refill: 2  4. Intermittent low back pain  - tiZANidine (ZANAFLEX) 4 MG tablet; Take 1 tablet (4 mg total) by mouth every 6 (six) hours as needed for muscle spasms.  Dispense: 30 tablet; Refill: 0  5. Vitamin D deficiency  Continue vitamin D supplementation   6. Hyperlipidemia, unspecified hyperlipidemia type  Continue statin therapy   7. Plantar fasciitis  Seen by Dr. Cleda Mccreedy, wearing a brace, stable  8. Pain in left lower leg  May be from plantar fascitis

## 2019-01-30 NOTE — Patient Instructions (Signed)
[facility]   Low Back Sprain Rehab Ask your health care provider which exercises are safe for you. Do exercises exactly as told by your health care provider and adjust them as directed. It is normal to feel mild stretching, pulling, tightness, or discomfort as you do these exercises, but you should stop right away if you feel sudden pain or your pain gets worse. Do not begin these exercises until told by your health care provider. Stretching and range of motion exercises These exercises warm up your muscles and joints and improve the movement and flexibility of your back. These exercises also help to relieve pain, numbness, and tingling. Exercise A: Lumbar rotation  1. Lie on your back on a firm surface and bend your knees. 2. Straighten your arms out to your sides so each arm forms an "L" shape with a side of your body (a 90 degree angle). 3. Slowly move both of your knees to one side of your body until you feel a stretch in your lower back. Try not to let your shoulders move off of the floor. 4. Hold for __________ seconds. 5. Tense your abdominal muscles and slowly move your knees back to the starting position. 6. Repeat this exercise on the other side of your body. Repeat __________ times. Complete this exercise __________ times a day. Exercise B: Prone extension on elbows  1. Lie on your abdomen on a firm surface. 2. Prop yourself up on your elbows. 3. Use your arms to help lift your chest up until you feel a gentle stretch in your abdomen and your lower back. ? This will place some of your body weight on your elbows. If this is uncomfortable, try stacking pillows under your chest. ? Your hips should stay down, against the surface that you are lying on. Keep your hip and back muscles relaxed. 4. Hold for __________ seconds. 5. Slowly relax your upper body and return to the starting position. Repeat __________ times. Complete this exercise __________ times a day. Strengthening  exercises These exercises build strength and endurance in your back. Endurance is the ability to use your muscles for a long time, even after they get tired. Exercise C: Pelvic tilt 1. Lie on your back on a firm surface. Bend your knees and keep your feet flat. 2. Tense your abdominal muscles. Tip your pelvis up toward the ceiling and flatten your lower back into the floor. ? To help with this exercise, you may place a small towel under your lower back and try to push your back into the towel. 3. Hold for __________ seconds. 4. Let your muscles relax completely before you repeat this exercise. Repeat __________ times. Complete this exercise __________ times a day. Exercise D: Alternating arm and leg raises  1. Get on your hands and knees on a firm surface. If you are on a hard floor, you may want to use padding to cushion your knees, such as an exercise mat. 2. Line up your arms and legs. Your hands should be below your shoulders, and your knees should be below your hips. 3. Lift your left leg behind you. At the same time, raise your right arm and straighten it in front of you. ? Do not lift your leg higher than your hip. ? Do not lift your arm higher than your shoulder. ? Keep your abdominal and back muscles tight. ? Keep your hips facing the ground. ? Do not arch your back. ? Keep your balance carefully, and do not hold your  breath. 4. Hold for __________ seconds. 5. Slowly return to the starting position and repeat with your right leg and your left arm. Repeat __________ times. Complete this exercise __________ times a day. Exercise E: Abdominal set with straight leg raise  1. Lie on your back on a firm surface. 2. Bend one of your knees and keep your other leg straight. 3. Tense your abdominal muscles and lift your straight leg up, 4-6 inches (10-15 cm) off the ground. 4. Keep your abdominal muscles tight and hold for __________ seconds. ? Do not hold your breath. ? Do not arch your  back. Keep it flat against the ground. 5. Keep your abdominal muscles tense as you slowly lower your leg back to the starting position. 6. Repeat with your other leg. Repeat __________ times. Complete this exercise __________ times a day. Posture and body mechanics  Body mechanics refers to the movements and positions of your body while you do your daily activities. Posture is part of body mechanics. Good posture and healthy body mechanics can help to relieve stress in your body's tissues and joints. Good posture means that your spine is in its natural S-curve position (your spine is neutral), your shoulders are pulled back slightly, and your head is not tipped forward. The following are general guidelines for applying improved posture and body mechanics to your everyday activities. Standing   When standing, keep your spine neutral and your feet about hip-width apart. Keep a slight bend in your knees. Your ears, shoulders, and hips should line up.  When you do a task in which you stand in one place for a long time, place one foot up on a stable object that is 2-4 inches (5-10 cm) high, such as a footstool. This helps keep your spine neutral. Sitting   When sitting, keep your spine neutral and keep your feet flat on the floor. Use a footrest, if necessary, and keep your thighs parallel to the floor. Avoid rounding your shoulders, and avoid tilting your head forward.  When working at a desk or a computer, keep your desk at a height where your hands are slightly lower than your elbows. Slide your chair under your desk so you are close enough to maintain good posture.  When working at a computer, place your monitor at a height where you are looking straight ahead and you do not have to tilt your head forward or downward to look at the screen. Resting   When lying down and resting, avoid positions that are most painful for you.  If you have pain with activities such as sitting, bending,  stooping, or squatting (flexion-based activities), lie in a position in which your body does not bend very much. For example, avoid curling up on your side with your arms and knees near your chest (fetal position).  If you have pain with activities such as standing for a long time or reaching with your arms (extension-based activities), lie with your spine in a neutral position and bend your knees slightly. Try the following positions:  Lying on your side with a pillow between your knees.  Lying on your back with a pillow under your knees. Lifting   When lifting objects, keep your feet at least shoulder-width apart and tighten your abdominal muscles.  Bend your knees and hips and keep your spine neutral. It is important to lift using the strength of your legs, not your back. Do not lock your knees straight out.  Always ask for help to  lift heavy or awkward objects. This information is not intended to replace advice given to you by your health care provider. Make sure you discuss any questions you have with your health care provider. Document Released: 12/06/2005 Document Revised: 08/12/2016 Document Reviewed: 09/17/2015 Elsevier Interactive Patient Education  2019 [employer].

## 2019-01-31 LAB — MICROALBUMIN / CREATININE URINE RATIO
Creatinine, Urine: 225 mg/dL (ref 20–275)
MICROALB UR: 0.6 mg/dL
Microalb Creat Ratio: 3 mcg/mg creat (ref <30)

## 2019-02-19 ENCOUNTER — Ambulatory Visit
Admission: RE | Admit: 2019-02-19 | Discharge: 2019-02-19 | Disposition: A | Discharge status: Home / Self Care | Payer: PPO | Source: Ambulatory Visit | Attending: General Surgery | Admitting: General Surgery

## 2019-02-19 DIAGNOSIS — Z1231 Encounter for screening mammogram for malignant neoplasm of breast: Secondary | ICD-10-CM | POA: Insufficient documentation

## 2019-02-19 DIAGNOSIS — Z853 Personal history of malignant neoplasm of breast: Secondary | ICD-10-CM | POA: Diagnosis not present

## 2019-02-19 HISTORY — DX: Personal history of irradiation: Z92.3

## 2019-02-27 ENCOUNTER — Encounter: Payer: Self-pay | Admitting: General Surgery

## 2019-02-27 ENCOUNTER — Other Ambulatory Visit: Payer: Self-pay

## 2019-02-27 ENCOUNTER — Ambulatory Visit: Payer: PPO | Admitting: General Surgery

## 2019-02-27 VITALS — BP 134/82 | HR 78 | Temp 97.4°F | Ht 66.0 in | Wt 206.0 lb

## 2019-02-27 DIAGNOSIS — Z853 Personal history of malignant neoplasm of breast: Secondary | ICD-10-CM

## 2019-02-27 NOTE — Progress Notes (Signed)
Patient ID: Morgan Peterson, female   DOB: 1951/02/27, 68 y.o.   MRN: [id number]  Chief Complaint  Patient presents with  . Follow-up    HPI Morgan Peterson is a 68 y.o. female who presents for a breast evaluation. The most recent mammogram was done on 02/19/2019 .  Patient does perform regular self breast checks and gets regular mammograms done.  No new breast issues.  HPI  Past Medical History:  Diagnosis Date  . Cancer [facility]) 2010   Right Breast  . Diabetes mellitus without complication ([place]) 6160  . Hypertension   . IBS (irritable bowel syndrome)   . 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 Arimidex therapy completed September 2015  . Personal history of malignant neoplasm of breast 2010  . Personal history of radiation therapy 2010   mammosite    Past Surgical History:  Procedure Laterality Date  . ABDOMINAL EXPLORATION SURGERY  2000  . BREAST BIOPSY Right 2010   +   . BREAST EXCISIONAL BIOPSY Right 2010   + mammo site inasive mammo ca  . BREAST LUMPECTOMY Right 2010   [place]  . COLONOSCOPY  07/19/2012   Normal exam, Dr. Bary Castilla  . TUBAL LIGATION  20 years ago    Family History  Problem Relation Age of Onset  . Hypertension Mother   . Hyperlipidemia Mother   . Diabetes Father   . Kidney disease Father   . Diabetes Brother   . Colon cancer Maternal Uncle   . Breast cancer Maternal Aunt     Social History Social History   Tobacco Use  . Smoking status: Former Smoker    Packs/day: 0.25    Years: 10.00    Pack years: 2.50    Types: Cigarettes  . Smokeless tobacco: Never Used  Substance Use Topics  . Alcohol use: Yes    Alcohol/week: 0.0 standard drinks    Comment: wine  . Drug use: No    Allergies  Allergen Reactions  . Levaquin [Levofloxacin In D5w] Swelling    Other  reaction(s): Arthralgia (Joint Pain)  . Losartan     Other reaction(s): Angioedema  . Amlodipine-Olmesartan   . Codeine   . Gabapentin     cns side effects.  . Glipizide     Other reaction(s): Abdominal pain  . Invokana [Canagliflozin] Other (See Comments)    Bladder pain  . Lisinopril Swelling    Angioedema  . Metformin And Related Nausea Only  . Onglyza [Saxagliptin] Other (See Comments)    Increased PVC's  . Tramadol Nausea And Vomiting  . Actos [Pioglitazone] Other (See Comments) and Nausea And Vomiting    Bladder pain  . Atorvastatin Other (See Comments)  . Carvedilol Other (See Comments)    NUMBNESS, TINGLING, ACHING IN ARMS  . Prednisone Palpitations    Current Outpatient Medications  Medication Sig Dispense Refill  . aspirin EC 81 MG tablet Take 1 tablet (81 mg total) by mouth daily. 30 tablet 0  . atenolol (TENORMIN) 25 MG tablet Take 1 tablet (25 mg total) by mouth daily. 90 tablet 1  . Blood Glucose Monitoring Suppl (ONETOUCH VERIO) w/Device KIT 1 Device by Does not apply route once. 1 kit 0  . calcium carbonate 200 MG capsule Take 200 mg by mouth 2 (two) times daily with a meal.  With Vitamin D3    . cholecalciferol (VITAMIN D) 1000 UNITS tablet Take 1,000 Units by mouth daily.    . fluticasone (FLONASE) 50 MCG/ACT nasal spray Place 2 sprays into both nostrils daily. 16 g 2  . glucose blood (ONETOUCH VERIO) test strip Use as instructed 100 each 12  . Lancets (ONETOUCH ULTRASOFT) lancets Use as instructed 100 each 3  . OZEMPIC, 0.25 OR 0.5 MG/DOSE, 2 MG/1.5ML SOPN Inject 0.5 mg into the skin once a week.    . rosuvastatin (CRESTOR) 5 MG tablet Take 1 tablet (5 mg total) by mouth daily. 90 tablet 1  . tiZANidine (ZANAFLEX) 4 MG tablet Take 1 tablet (4 mg total) by mouth every 6 (six) hours as needed for muscle spasms. 30 tablet 0  . triamterene-hydrochlorothiazide (MAXZIDE-25) 37.5-25 MG tablet Take 1 tablet by mouth daily. 90 tablet 1  . vitamin B-12 (CYANOCOBALAMIN)  1000 MCG tablet Take 1 tablet by mouth 2 (two) times a week.     No current facility-administered medications for this visit.     Review of Systems Review of Systems  Constitutional: Negative.   Respiratory: Negative.   Cardiovascular: Negative.     Blood pressure 134/82, pulse 78, temperature (!) 97.4 F (36.3 C), temperature source Skin, height _0  (1.676 m), weight 206 lb (93.4 kg), SpO2 98 %.  Physical Exam Physical Exam Exam conducted with a chaperone present.  Constitutional:      Appearance: She is well-developed.  Eyes:     General: No scleral icterus.    Conjunctiva/sclera: Conjunctivae normal.  Neck:     Musculoskeletal: Neck supple.  Cardiovascular:     Rate and Rhythm: Normal rate and regular rhythm.     Heart sounds: Normal heart sounds.  Pulmonary:     Effort: Pulmonary effort is normal.     Breath sounds: Normal breath sounds.  Chest:     Breasts:        Right: No inverted nipple, mass, nipple discharge, skin change or tenderness.        Left: No inverted nipple, mass, nipple discharge, skin change or tenderness.       Comments: Right breast lumpectomy  Lymphadenopathy:     Cervical: No cervical adenopathy.     Upper Body:     Right upper body: No supraclavicular or axillary adenopathy.     Left upper body: No supraclavicular or axillary adenopathy.  Skin:    General: Skin is warm and dry.  Neurological:     Mental Status: She is alert and oriented to person, place, and time.  Psychiatric:        Behavior: Behavior normal.     Data Reviewed February 19, 2019 bilateral screening mammograms reviewed.  Postsurgical changes on the right.  BI-RADS-1.  Assessment Benign breast exam.  Plan  The patient is now 10 years post treatment of her T1b N0 carcinoma.  She completed aromatase inhibitor therapy 5 years ago.  She was offered the opportunity to have her annual exams with her primary care provider but she declined, preferring to return here next  year.  We will make arrangements for bilateral screening mammograms in office follow-up in 1 year. The patient is aware to call back for any questions or new concerns.    HPI, assessment, plan and physical exam has been scribed under the direction and in the presence of Robert Bellow, MD. Karie Fetch, RN  HPI, Physical Exam, Assessment and Plan have been scribed under the direction and  in the presence of Hervey Ard, MD.  Gaspar Cola, CMA   I have completed the exam and reviewed the above documentation for accuracy and completeness.  I agree with the above.  Haematologist has been used and any errors in dictation or transcription are unintentional.  Hervey Ard, M.D., F.A.C.S.  Forest Gleason Teena Mangus 02/27/2019, 11:19 AM

## 2019-02-27 NOTE — Patient Instructions (Signed)
The patient is aware to call back for any questions or new concerns.  

## 2019-03-30 ENCOUNTER — Other Ambulatory Visit: Payer: Self-pay

## 2019-03-30 ENCOUNTER — Telehealth: Payer: Self-pay | Admitting: Emergency Medicine

## 2019-03-30 ENCOUNTER — Encounter: Payer: Self-pay | Admitting: Family Medicine

## 2019-03-30 ENCOUNTER — Ambulatory Visit (INDEPENDENT_AMBULATORY_CARE_PROVIDER_SITE_OTHER): Payer: PPO | Admitting: Family Medicine

## 2019-03-30 VITALS — Ht 66.0 in | Wt 199.0 lb

## 2019-03-30 DIAGNOSIS — R05 Cough: Secondary | ICD-10-CM | POA: Diagnosis not present

## 2019-03-30 DIAGNOSIS — J302 Other seasonal allergic rhinitis: Secondary | ICD-10-CM | POA: Diagnosis not present

## 2019-03-30 DIAGNOSIS — R059 Cough, unspecified: Secondary | ICD-10-CM

## 2019-03-30 MED ORDER — PROMETHAZINE-CODEINE 6.25-10 MG/5ML PO SYRP: 5.0000 mL | ORAL_SOLUTION | Freq: Every evening | ORAL | 0 refills | Status: DC | PRN

## 2019-03-30 MED ORDER — MONTELUKAST SODIUM 10 MG PO TABS: 10.0000 mg | ORAL_TABLET | Freq: Every day | ORAL | 2 refills | Status: DC

## 2019-03-30 MED ORDER — LORATADINE 10 MG PO TABS: 10.0000 mg | ORAL_TABLET | Freq: Every day | ORAL | 2 refills | Status: DC

## 2019-03-30 NOTE — Telephone Encounter (Signed)
Having a cough on and off for a month. Would like cough syrup that was given before called in

## 2019-03-30 NOTE — Progress Notes (Signed)
Name: Morgan Peterson   MRN: [id number]    DOB: [birth year]   Date:03/30/2019       Progress Note  Subjective  Chief Complaint  Chief Complaint  Patient presents with  . Cough    Onset-Since she had the Flu Shot in October 2019- Dry Cough, Congestion, Fatigue, Sore throat will come intermittently. Taking Claritin but is not helping     I connected with@ on 03/30/19 at  2:00 PM EDT by a video enabled telemedicine application and verified that I am speaking with the correct person using two identifiers.  I discussed the limitations of evaluation and management by telemedicine and the availability of in person appointments. The patient expressed understanding and agreed to proceed. Staff also discussed with the patient that there may be a patient responsible charge related to this service. Patient Location: her car , parked  Provider Location: [facility] Additional Individuals present: her sister   HPI  Cough: she states she had flu like symptoms in Dec and since than she has noticed an intermittent dry cough, however over the past week she has noticed worsening of symptoms. She states now the cough is daily, sometimes dry and sometimes feels wet, also noticed some right side chest discomfort when taking a deep breath. She denies fever, chills, body aches or headaches. No nausea or change in appetite. Normal level of activity. No SOB. She states promethazine with codeine is the best thing for her cough. She thinks symptoms worse secondary to pollen. She uses topical eye drops for itchy eyes and has flonase for nasal congestion and those symptoms are under control. Discussed singulair, loratadine and advised to return for CXR and spirometry if no resolution or significant improvement. Low likelihood for COVID-19. She was a smoker but quit in 1989   Patient Active Problem List   Diagnosis Date Noted  . Chronic low back pain without sciatica 08/18/2017  . Irritable bowel  syndrome with constipation 05/02/2017  . Vitamin D deficiency 09/09/2016  . Hiatal hernia 06/02/2016  . Allergic rhinitis, seasonal 06/02/2016  . Obesity (BMI 30-39.9) 04/30/2016  . Angio-edema 04/30/2016  . Hyperlipidemia 11/04/2015  . Hyperlipidemia due to type 2 diabetes mellitus ([place]) 11/04/2015  . Cervical radiculopathy at C6 07/31/2015  . Neuroma digital nerve 07/31/2015  . Type 2 diabetes mellitus with hyperglycemia, with long-term current use of insulin ([place]) 06/30/2015  . Right shoulder pain 06/30/2015  . Cervical disc disease 06/30/2015  . Essential hypertension 06/30/2015  . History of breast cancer in female 03/23/2009    Past Surgical History:  Procedure Laterality Date  . ABDOMINAL EXPLORATION SURGERY  2000  . BREAST BIOPSY Right 2010   +   . BREAST EXCISIONAL BIOPSY Right 2010   + mammo site inasive mammo ca  . BREAST LUMPECTOMY Right 2010   [place]  . COLONOSCOPY  07/19/2012   Normal exam, Dr. Bary Castilla  . TUBAL LIGATION  20 years ago    Family History  Problem Relation Age of Onset  . Hypertension Mother   . Hyperlipidemia Mother   . Diabetes Father   . Kidney disease Father   . Diabetes Brother   . Colon cancer Maternal Uncle   . Breast cancer Maternal Aunt     Social History   Socioeconomic History  . Marital status: Married    Spouse name: Antonio  . Number of children: 2  . Years of education: Not on file  . Highest education level: Not on file  Occupational  History  . 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 Financial resource strain: Not hard at all  . Food insecurity:    Worry: Never true    Inability: Never true  . Transportation needs:    Medical: No    Non-medical: No  Tobacco Use  . Smoking status: Former Smoker    Packs/day: 0.25    Years: 10.00    Pack years: 2.50    Types: Cigarettes  . Smokeless tobacco: Never Used  Substance and Sexual Activity  . Alcohol use: Yes    Alcohol/week: 0.0 standard  drinks    Comment: wine  . Drug use: No  . Sexual activity: Not Currently    Comment: husband has ED  Lifestyle  . Physical activity:    Days per week: 0 days    Minutes per session: 0 min  . Stress: To some extent  Relationships  . Social connections:    Talks on phone: More than three times a week    Gets together: More than three times a week    Attends religious service: More than 4 times per year    Active member of club or organization: Yes    Attends meetings of clubs or organizations: More than 4 times per year    Relationship status: Married  . Intimate partner violence:    Fear of current or ex partner: No    Emotionally abused: No    Physically abused: No    Forced sexual activity: No  Other Topics Concern  . Not on file  Social History Narrative  . Not on file     Current Outpatient Medications:  .  aspirin EC 81 MG tablet, Take 1 tablet (81 mg total) by mouth daily., Disp: 30 tablet, Rfl: 0 .  atenolol (TENORMIN) 25 MG tablet, Take 1 tablet (25 mg total) by mouth daily., Disp: 90 tablet, Rfl: 1 .  Blood Glucose Monitoring Suppl (ONETOUCH VERIO) w/Device KIT, 1 Device by Does not apply route once., Disp: 1 kit, Rfl: 0 .  calcium carbonate 200 MG capsule, Take 200 mg by mouth 2 (two) times daily with a meal. With Vitamin D3, Disp: , Rfl:  .  cholecalciferol (VITAMIN D) 1000 UNITS tablet, Take 1,000 Units by mouth daily., Disp: , Rfl:  .  fluticasone (FLONASE) 50 MCG/ACT nasal spray, Place 2 sprays into both nostrils daily., Disp: 16 g, Rfl: 2 .  glucose blood (ONETOUCH VERIO) test strip, Use as instructed, Disp: 100 each, Rfl: 12 .  Lancets (ONETOUCH ULTRASOFT) lancets, Use as instructed, Disp: 100 each, Rfl: 3 .  OZEMPIC, 0.25 OR 0.5 MG/DOSE, 2 MG/1.5ML SOPN, Inject 0.5 mg into the skin once a week., Disp: , Rfl:  .  rosuvastatin (CRESTOR) 5 MG tablet, Take 1 tablet (5 mg total) by mouth daily., Disp: 90 tablet, Rfl: 1 .  tiZANidine (ZANAFLEX) 4 MG tablet, Take 1  tablet (4 mg total) by mouth every 6 (six) hours as needed for muscle spasms., Disp: 30 tablet, Rfl: 0 .  triamterene-hydrochlorothiazide (MAXZIDE-25) 37.5-25 MG tablet, Take 1 tablet by mouth daily., Disp: 90 tablet, Rfl: 1 .  vitamin B-12 (CYANOCOBALAMIN) 1000 MCG tablet, Take 1 tablet by mouth 3 (three) times a week. , Disp: , Rfl:  .  ZINC-VITAMIN C PO, Take 1 tablet by mouth daily., Disp: , Rfl:  .  Calcium Carb-Cholecalciferol 200-10 MG-MCG CAPS, Take by mouth., Disp: , Rfl:   Allergies  Allergen Reactions  .  Levaquin [Levofloxacin In D5w] Swelling    Other reaction(s): Arthralgia (Joint Pain)  . Losartan     Other reaction(s): Angioedema  . Amlodipine-Olmesartan   . Codeine   . Gabapentin     cns side effects.  . Glipizide     Other reaction(s): Abdominal pain  . Invokana [Canagliflozin] Other (See Comments)    Bladder pain  . Lisinopril Swelling    Angioedema  . Metformin And Related Nausea Only  . Onglyza [Saxagliptin] Other (See Comments)    Increased PVC's  . Tramadol Nausea And Vomiting  . Actos [Pioglitazone] Other (See Comments) and Nausea And Vomiting    Bladder pain  . Atorvastatin Other (See Comments)  . Carvedilol Other (See Comments)    NUMBNESS, TINGLING, ACHING IN ARMS  . Prednisone Palpitations    I personally reviewed active problem list, medication list, allergies, family history, social history with the patient/caregiver today.   ROS  Ten systems reviewed and is negative except as mentioned in HPI   Objective  Virtual encounter, vitals not obtained.  Body mass index is 32.12 kg/m.  Physical Exam  Awake, alert, in no distress and did not cough during our video conversation   PHQ2/9: Depression screen [facility] 2/9 03/30/2019 01/30/2019 12/05/2018 11/03/2018 12/21/2017  Decreased Interest 0 0 0 0 0  Down, Depressed, Hopeless 0 0 0 0 0  PHQ - 2 Score 0 0 0 0 0  Altered sleeping 0 - 0 0 -  Tired, decreased energy 0 - 0 0 -  Change in appetite 0 -  0 0 -  Feeling bad or failure about yourself  0 - 0 0 -  Trouble concentrating 0 - 0 0 -  Moving slowly or fidgety/restless 0 - 0 0 -  Suicidal thoughts 0 - 0 0 -  PHQ-9 Score 0 - 0 0 -  Difficult doing work/chores Not difficult at all - Not difficult at all Not difficult at all -   PHQ-2/9 Result is negative.    Fall Risk: Fall Risk  03/30/2019 02/27/2019 01/30/2019 12/05/2018 11/03/2018  Falls in the past year? 0 0 0 0 0  Number falls in past yr: 0 - 0 0 -  Injury with Fall? 0 - 0 0 -     Assessment & Plan  1. Cough  Return for spirometry and CXR if worsening or no improvement of symptoms  - promethazine-codeine (PHENERGAN WITH CODEINE) 6.25-10 MG/5ML syrup; Take 5 mLs by mouth at bedtime as needed for cough.  Dispense: 120 mL; Refill: 0  2. Seasonal allergic rhinitis, unspecified trigger  - montelukast (SINGULAIR) 10 MG tablet; Take 1 tablet (10 mg total) by mouth at bedtime.  Dispense: 30 tablet; Refill: 2 - loratadine (CLARITIN) 10 MG tablet; Take 1 tablet (10 mg total) by mouth daily.  Dispense: 30 tablet; Refill: 2  I discussed the assessment and treatment plan with the patient. The patient was provided an opportunity to ask questions and all were answered. The patient agreed with the plan and demonstrated an understanding of the instructions.  The patient was advised to call back or seek an in-person evaluation if the symptoms worsen or if the condition fails to improve as anticipated.  I provided 15  minutes of non-face-to-face time during this encounter.

## 2019-04-23 ENCOUNTER — Telehealth: Payer: Self-pay | Admitting: Family Medicine

## 2019-04-23 ENCOUNTER — Other Ambulatory Visit: Payer: Self-pay | Admitting: Family Medicine

## 2019-04-23 DIAGNOSIS — R05 Cough: Secondary | ICD-10-CM

## 2019-04-23 DIAGNOSIS — R059 Cough, unspecified: Secondary | ICD-10-CM

## 2019-04-23 NOTE — Telephone Encounter (Signed)
She is going to go to have a chest x-ray tomorrow. 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 She will reach out to [facility] and the health dept about testing options.

## 2019-04-23 NOTE — Telephone Encounter (Signed)
Copied from [place] [phone]. Topic: Quick Communication - See Telephone Encounter >> Apr 23, 2019 11:40 AM Vernona Rieger wrote: CRM for notification. See Telephone encounter for: 04/23/19. Patient said she has an ongoing issue that she just discussed with Dr Ancil Boozer. She did a video appt on 4/10 and is not improving with the medications. She is requesting a chest xray and a covid 19 test. Please advise.

## 2019-04-24 ENCOUNTER — Ambulatory Visit
Admission: RE | Admit: 2019-04-24 | Discharge: 2019-04-24 | Disposition: A | Discharge status: Home / Self Care | Payer: PPO | Source: Ambulatory Visit | Attending: Family Medicine | Admitting: Family Medicine

## 2019-04-24 ENCOUNTER — Other Ambulatory Visit: Payer: Self-pay | Admitting: Family Medicine

## 2019-04-24 ENCOUNTER — Other Ambulatory Visit: Payer: Self-pay

## 2019-04-24 DIAGNOSIS — R05 Cough: Secondary | ICD-10-CM

## 2019-04-24 DIAGNOSIS — J302 Other seasonal allergic rhinitis: Secondary | ICD-10-CM

## 2019-04-24 DIAGNOSIS — R079 Chest pain, unspecified: Secondary | ICD-10-CM | POA: Diagnosis not present

## 2019-04-24 DIAGNOSIS — R059 Cough, unspecified: Secondary | ICD-10-CM

## 2019-06-06 DIAGNOSIS — E119 Type 2 diabetes mellitus without complications: Secondary | ICD-10-CM | POA: Diagnosis not present

## 2019-06-06 LAB — HM DIABETES EYE EXAM

## 2019-06-18 IMAGING — US US THYROID
1 series · 14 of 25 positions shown · non-contrast
Comparison: None.

CLINICAL DATA: Palpable abnormality.  Thyroid nodule.

EXAM:
THYROID ULTRASOUND
TECHNIQUE: Ultrasound examination of the thyroid gland and adjacent soft
tissues was performed.

[Series 1: us thyroid · 0.07mm/px · 14 of 40 slices shown]
[im 1/40]
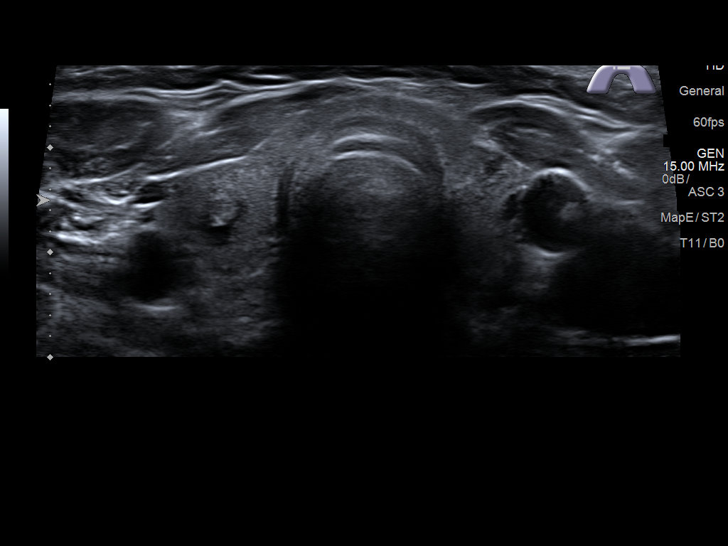
[im 4/40]
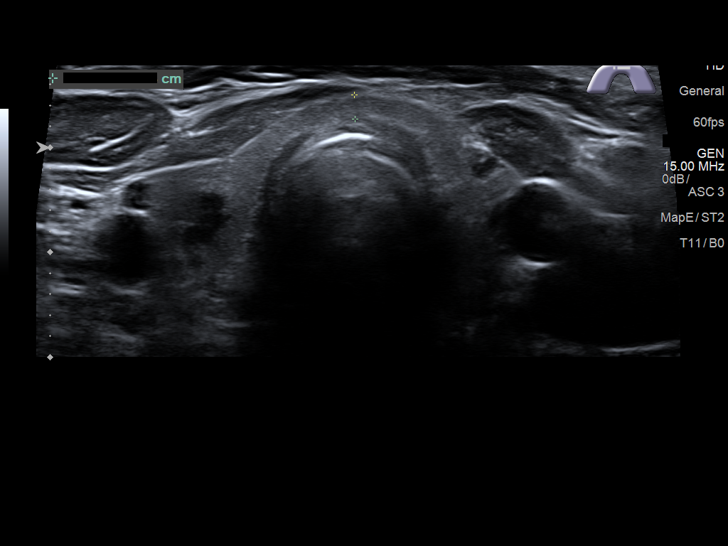
[im 7/40]
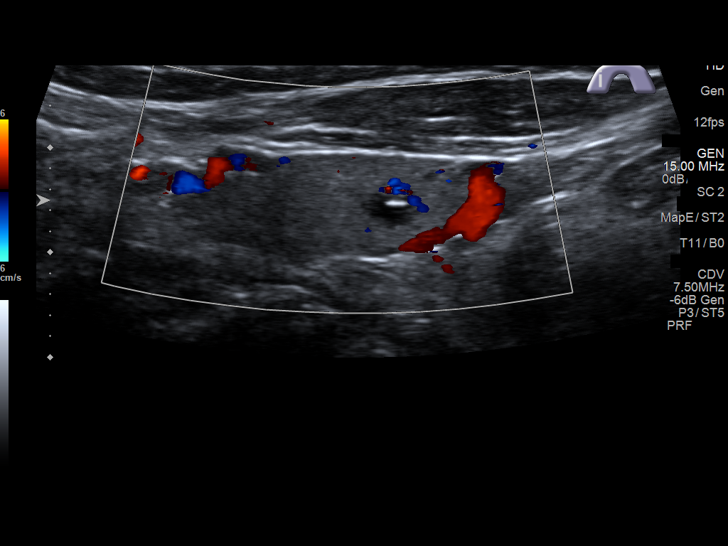
[im 10/40]
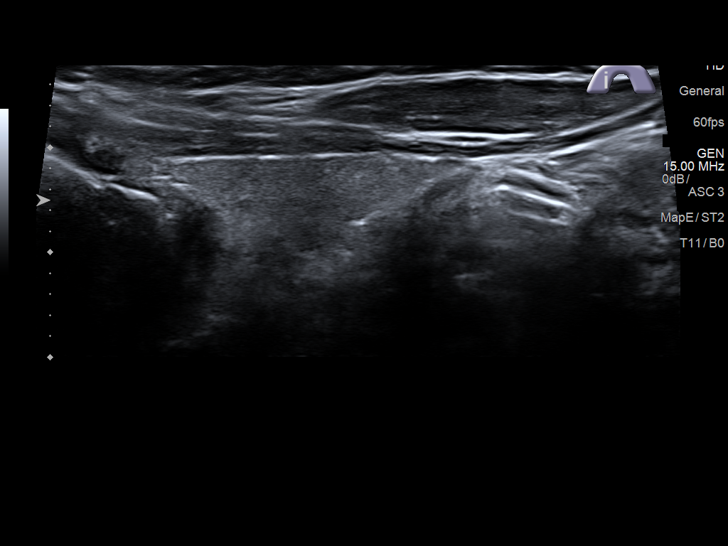
[im 14/40]
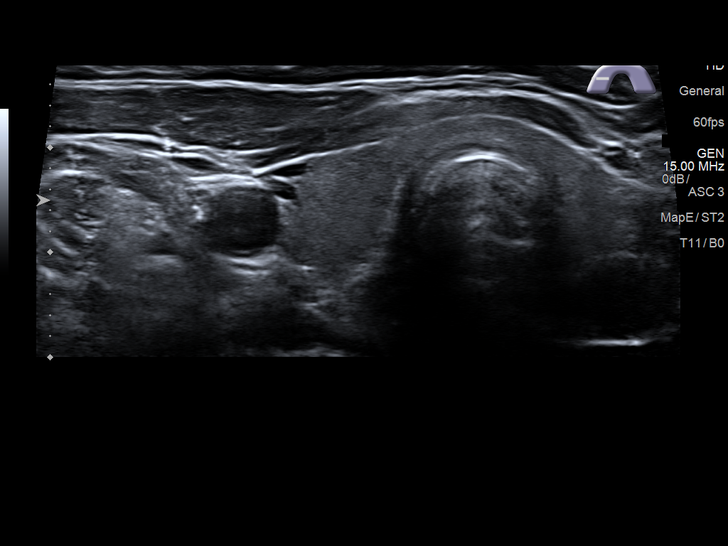
[im 15/40]
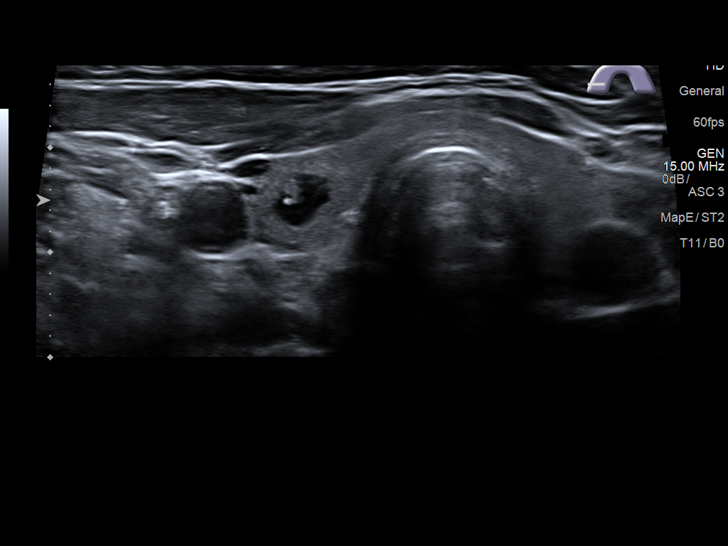
[im 18/40]
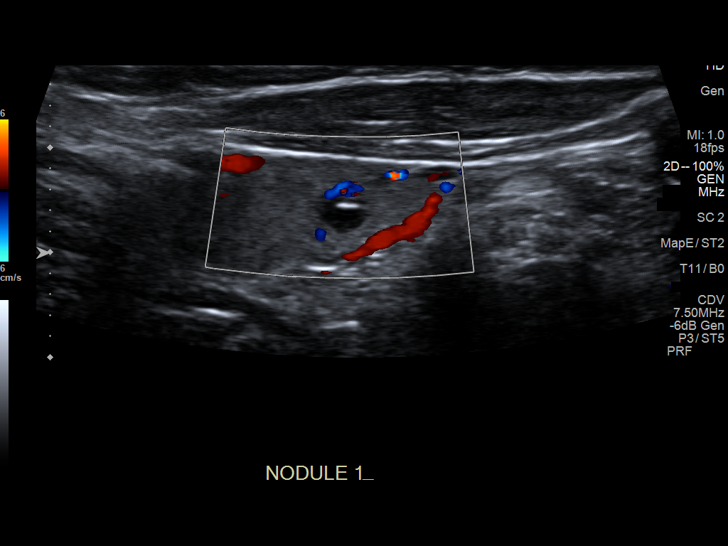
[im 22/40]
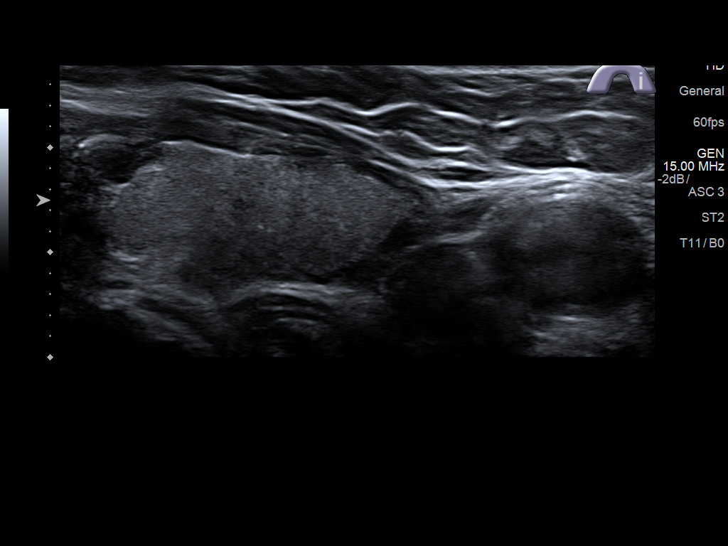
[im 25/40]
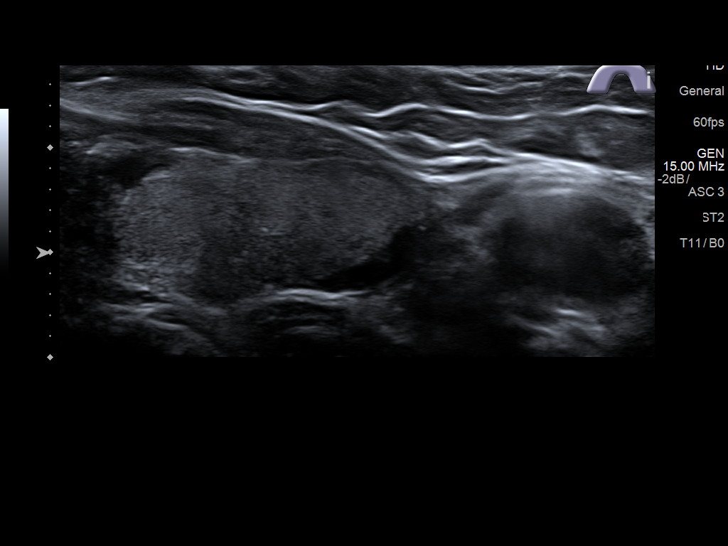
[im 27/40]
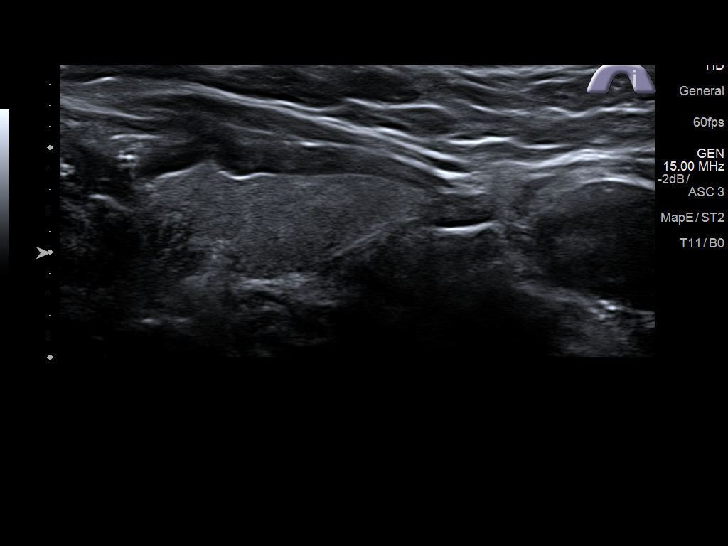
[im 30/40]
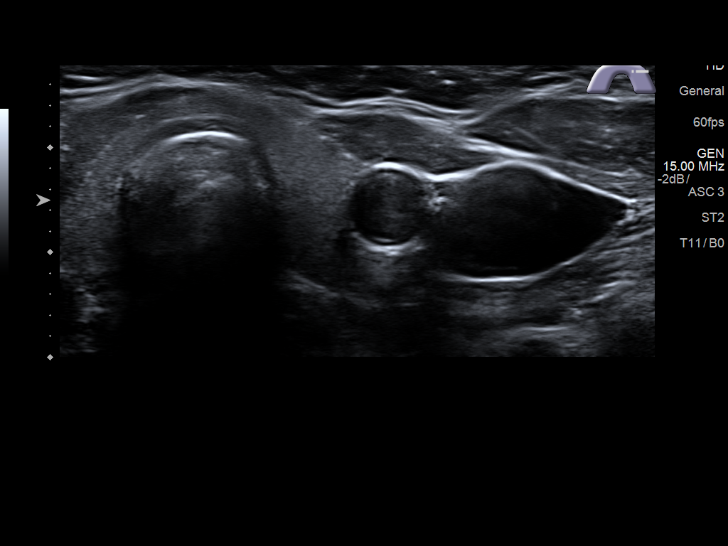
[im 33/40]
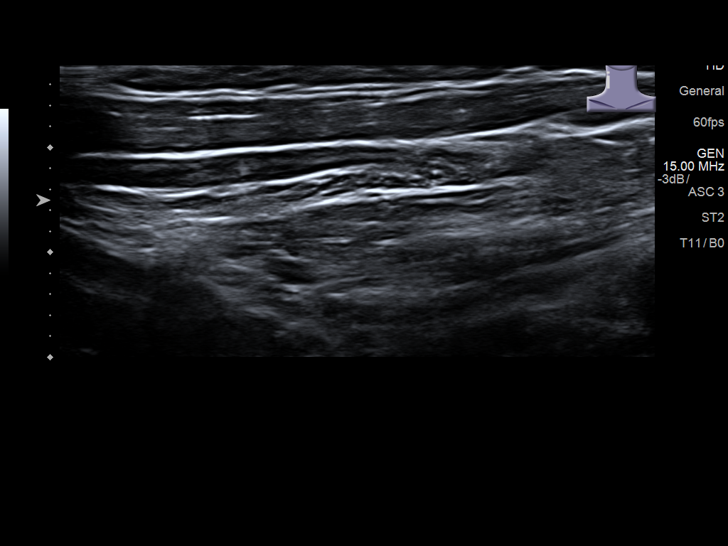
[im 36/40]
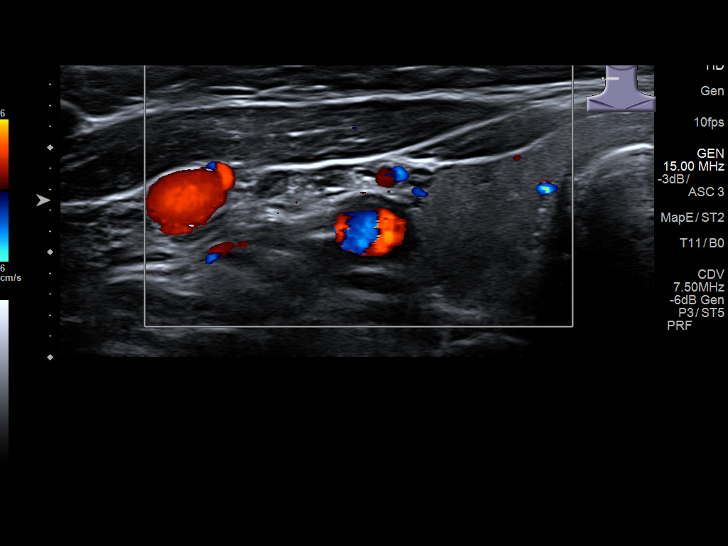
[im 40/40]
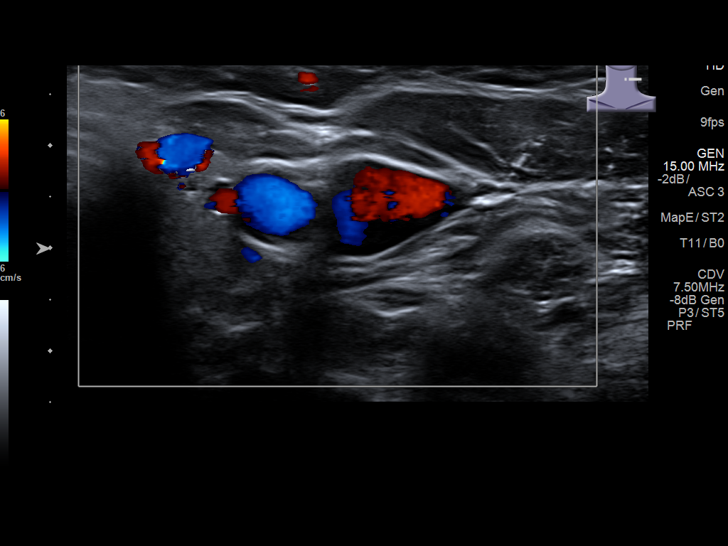

[14 of 25 positions shown; findings below may reference images not displayed]

FINDINGS: Parenchymal Echotexture: Normal

Isthmus: 0.2 cm

Right lobe: 3.2 x 0.9 x 1.2 cm

Left lobe: 3.1 x 1.2 x 0.9 cm

_________________________________________________________

Estimated total number of nodules >/= 1 cm: 0

Number of spongiform nodules >/=  2 cm not described below (TR1): 0

Number of mixed cystic and solid nodules >/= 1.5 cm not described
below (TR2): 0

_________________________________________________________

There is a 0.5 cm cystic nodule in the right lower pole which does
not meet criteria for biopsy nor follow-up.
IMPRESSION: The gland is within normal limits in size. A benign appearing tiny
nodule in the right lobe is noted. This study does not meet criteria
for biopsy nor follow-up.

The above is in keeping with the ACR TI-RADS recommendations - [HOSPITAL] 0282;[DATE].

## 2019-06-18 IMAGING — US US TRANSVAGINAL NON-OB
1 series · 14 of 25 positions shown · non-contrast
Comparison: Prior CT from 08/28/2007

CLINICAL DATA: Initial evaluation for left pelvic pain for 6
months. History of ovarian cyst.

EXAM:
TRANSABDOMINAL AND TRANSVAGINAL ULTRASOUND OF PELVIS
TECHNIQUE: Both transabdominal and transvaginal ultrasound examinations of the
pelvis were performed. Transabdominal technique was performed for
global imaging of the pelvis including uterus, ovaries, adnexal
regions, and pelvic cul-de-sac. It was necessary to proceed with
endovaginal exam following the transabdominal exam to visualize the
uterus and ovaries.

[Series 1: us transvaginal non-ob · 0.24mm/px · 14 of 80 slices shown]
[im 1/80]
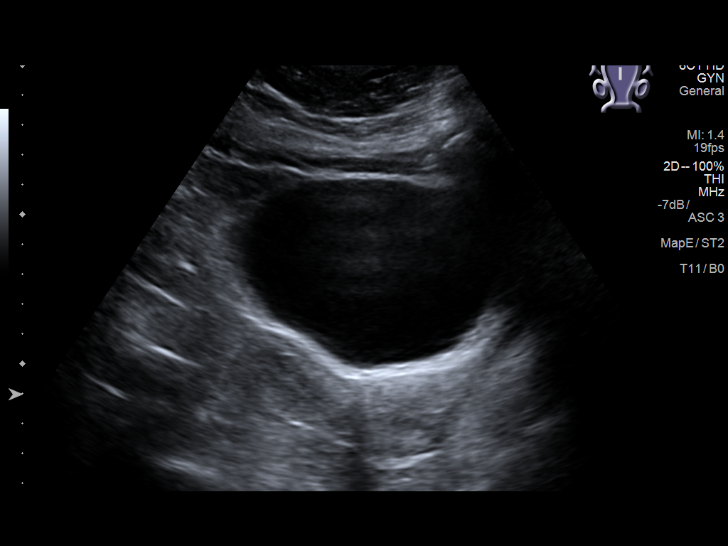
[im 7/80]
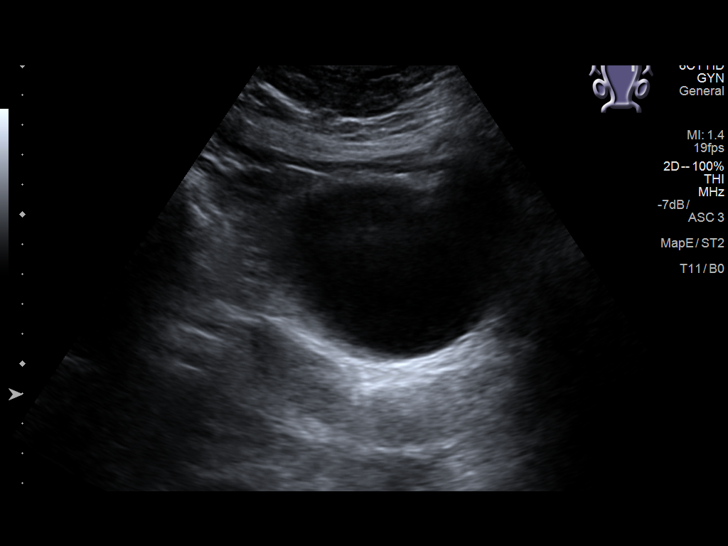
[im 14/80]
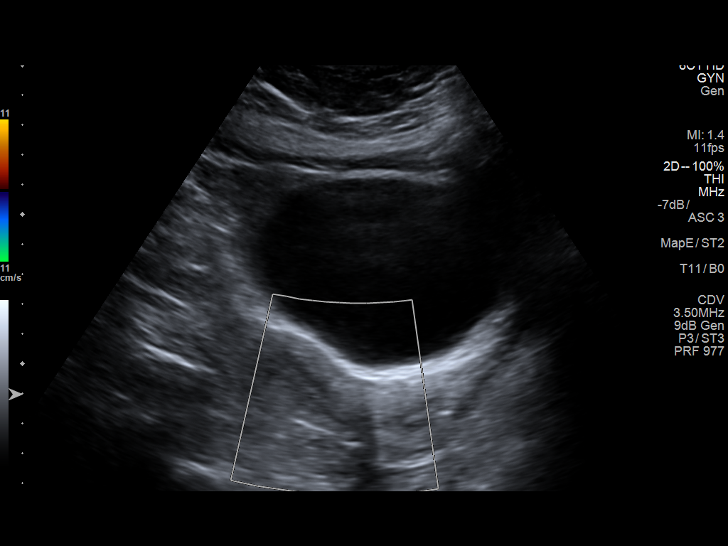
[im 20/80]
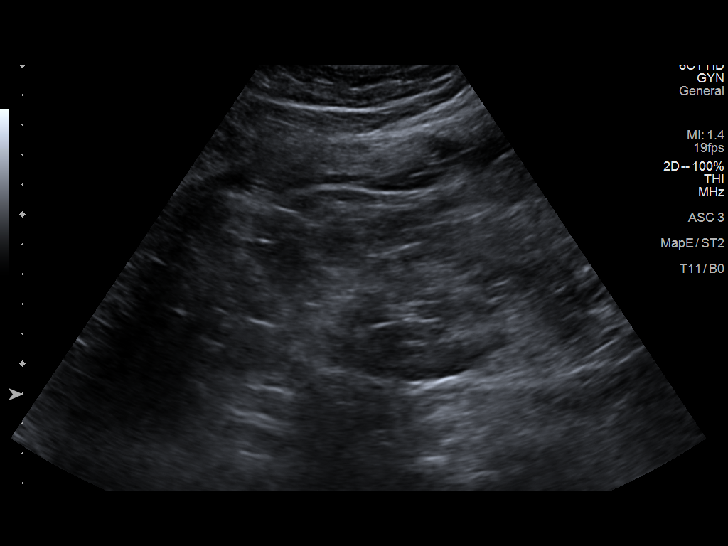
[im 27/80]
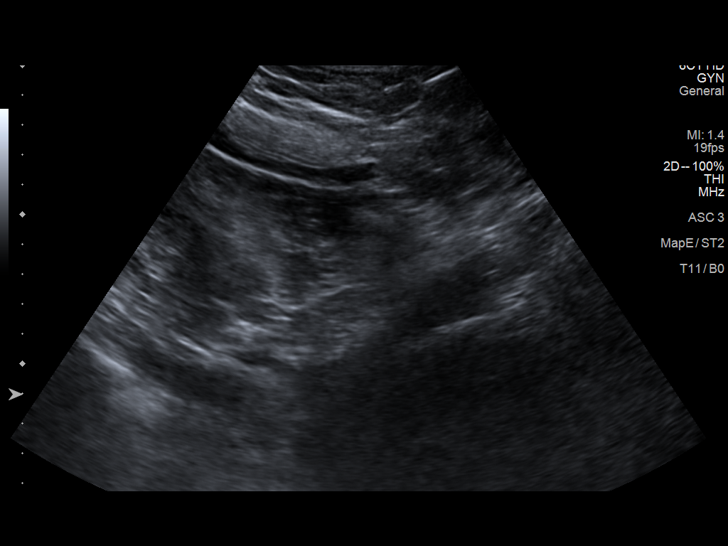
[im 30/80]
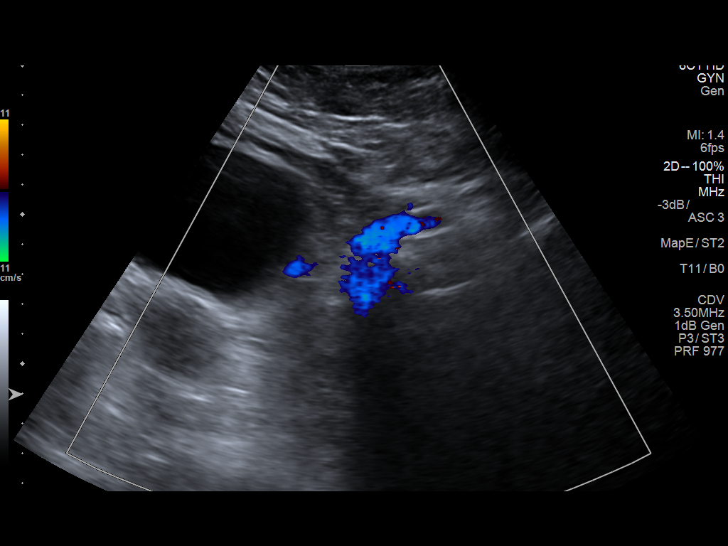
[im 37/80]
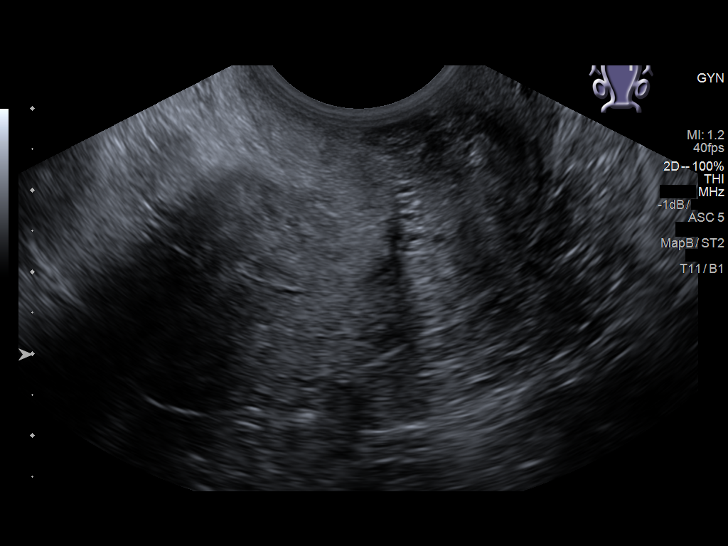
[im 43/80]
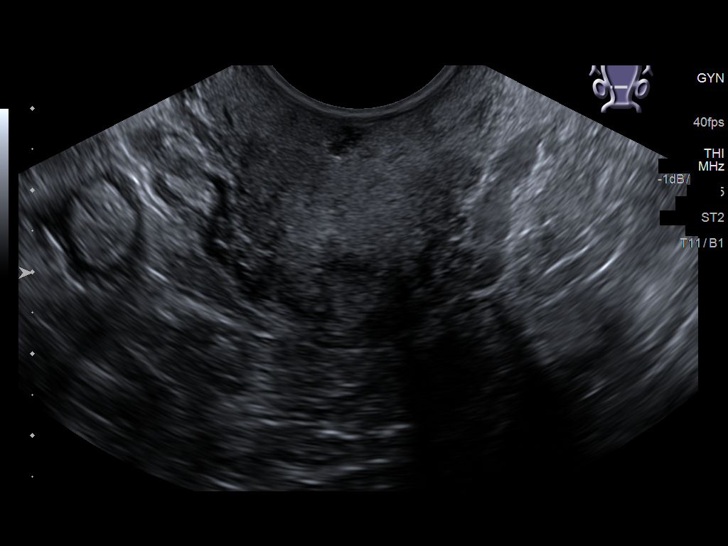
[im 50/80]
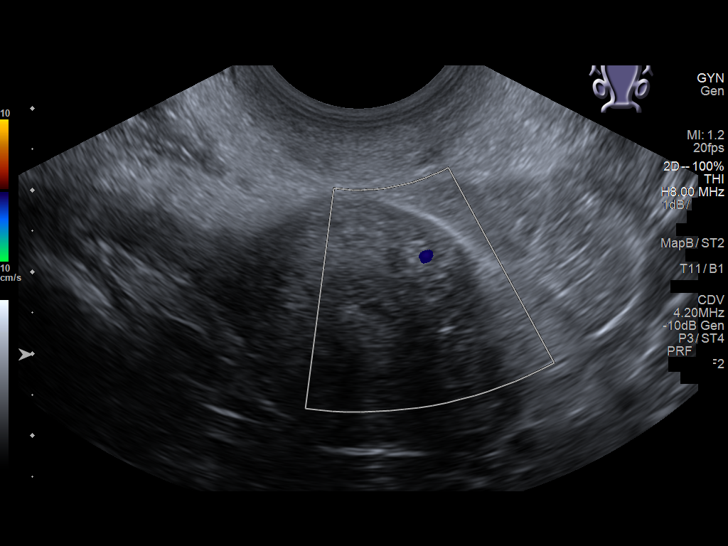
[im 53/80]
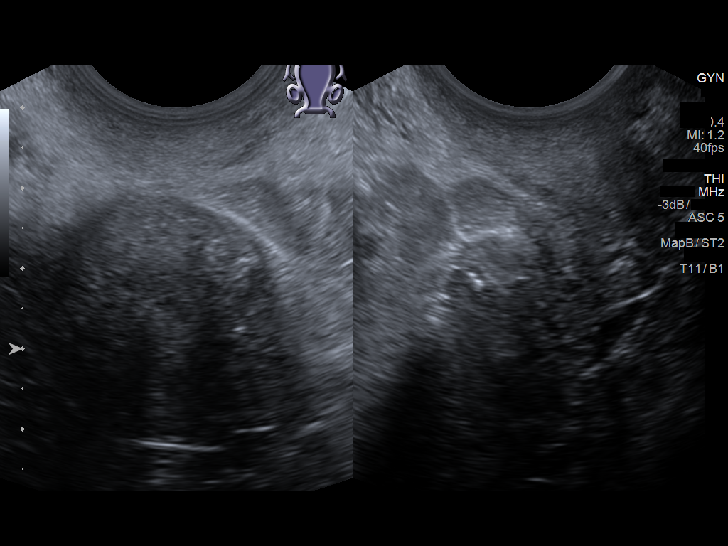
[im 60/80]
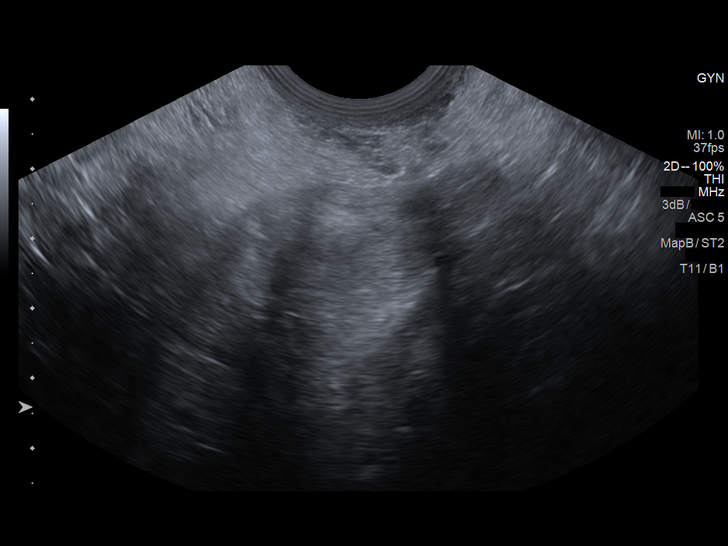
[im 66/80]
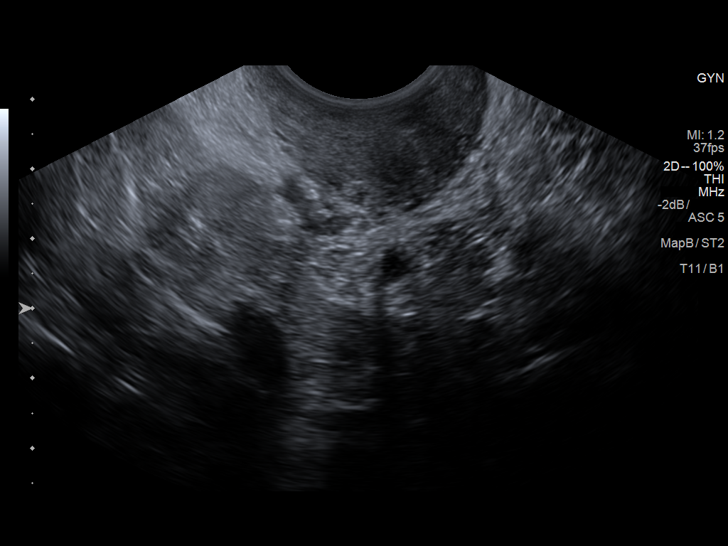
[im 73/80]
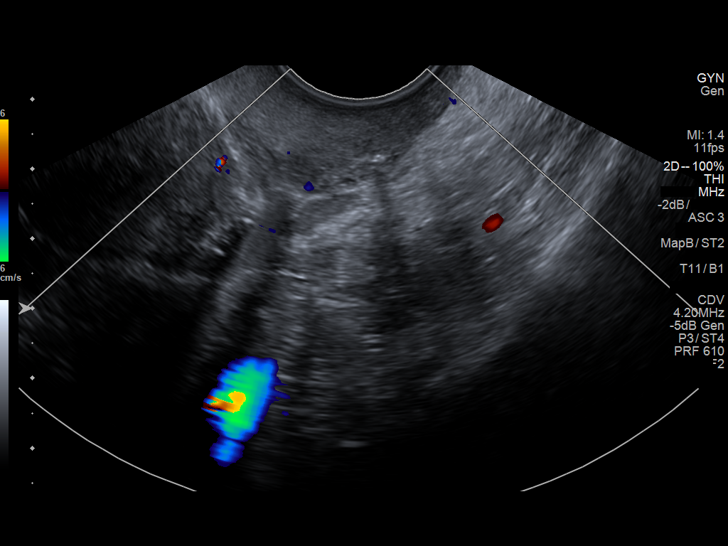
[im 80/80]
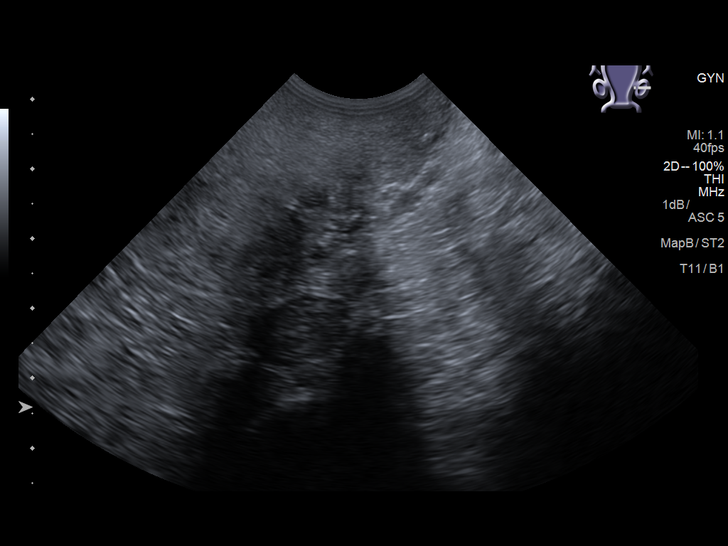

[14 of 25 positions shown; findings below may reference images not displayed]

FINDINGS: Uterus

Measurements: 5.6 x 2.9 x 3.3 cm. Small intramural fibroid at the
left anterior uterine fundus measured 1.2 x 1.2 x 1.0 cm

Endometrium

Thickness: 3.4 mm.  No focal abnormality visualized.

Right ovary

Not visualized.  No adnexal mass.

Left ovary

Measurements: 1.8 x 1.1 x 1.3 cm. Normal appearance/no adnexal mass.

Other findings

No abnormal free fluid.
IMPRESSION: 1. Fibroid uterus as above.
2. Normal sonographic appearance of the left ovary.
3. Nonvisualization of the right ovary.  No adnexal mass.

## 2019-06-21 ENCOUNTER — Other Ambulatory Visit: Payer: Self-pay | Admitting: Family Medicine

## 2019-06-21 DIAGNOSIS — J302 Other seasonal allergic rhinitis: Secondary | ICD-10-CM

## 2019-06-21 NOTE — Telephone Encounter (Signed)
Refill request for general medication. Singulair   Last office visit 03/30/2019   Follow up on 08/10/2019

## 2019-06-26 DIAGNOSIS — E785 Hyperlipidemia, unspecified: Secondary | ICD-10-CM | POA: Diagnosis not present

## 2019-06-26 DIAGNOSIS — E559 Vitamin D deficiency, unspecified: Secondary | ICD-10-CM | POA: Diagnosis not present

## 2019-06-26 DIAGNOSIS — I10 Essential (primary) hypertension: Secondary | ICD-10-CM | POA: Diagnosis not present

## 2019-06-26 DIAGNOSIS — Z794 Long term (current) use of insulin: Secondary | ICD-10-CM | POA: Diagnosis not present

## 2019-06-26 DIAGNOSIS — E119 Type 2 diabetes mellitus without complications: Secondary | ICD-10-CM | POA: Diagnosis not present

## 2019-06-26 DIAGNOSIS — E1159 Type 2 diabetes mellitus with other circulatory complications: Secondary | ICD-10-CM | POA: Diagnosis not present

## 2019-06-26 DIAGNOSIS — E1169 Type 2 diabetes mellitus with other specified complication: Secondary | ICD-10-CM | POA: Diagnosis not present

## 2019-06-26 LAB — HEMOGLOBIN A1C: Hemoglobin A1C: 6.5

## 2019-06-26 LAB — HEPATIC FUNCTION PANEL
ALT: 14 (ref 7–35)
AST: 15 (ref 13–35)

## 2019-06-26 LAB — BASIC METABOLIC PANEL
BUN: 9 (ref 4–21)
Glucose: 104
Potassium: 3.9 (ref 3.4–5.3)
Sodium: 141 (ref 137–147)

## 2019-06-26 LAB — LIPID PANEL
Cholesterol: 151 (ref 0–200)
HDL: 49 (ref 35–70)
LDL Cholesterol: 87
Triglycerides: 75 (ref 40–160)

## 2019-06-26 LAB — VITAMIN D 25 HYDROXY (VIT D DEFICIENCY, FRACTURES): Vit D, 25-Hydroxy: 35

## 2019-07-19 ENCOUNTER — Encounter: Payer: Self-pay | Admitting: General Surgery

## 2019-07-19 ENCOUNTER — Telehealth: Payer: Self-pay | Admitting: General Practice

## 2019-07-19 NOTE — Telephone Encounter (Signed)
Spoke with patient and is waiting to decided, wants to see if Dr. Bary Castilla goes somewhere else.

## 2019-07-22 ENCOUNTER — Other Ambulatory Visit: Payer: Self-pay | Admitting: Family Medicine

## 2019-07-22 DIAGNOSIS — J302 Other seasonal allergic rhinitis: Secondary | ICD-10-CM

## 2019-08-09 ENCOUNTER — Ambulatory Visit: Payer: PPO

## 2019-08-10 ENCOUNTER — Ambulatory Visit: Payer: PPO

## 2019-08-10 ENCOUNTER — Ambulatory Visit: Payer: PPO | Admitting: Family Medicine

## 2019-08-11 ENCOUNTER — Other Ambulatory Visit: Payer: Self-pay | Admitting: Family Medicine

## 2019-08-11 DIAGNOSIS — I10 Essential (primary) hypertension: Secondary | ICD-10-CM

## 2019-08-16 ENCOUNTER — Ambulatory Visit (INDEPENDENT_AMBULATORY_CARE_PROVIDER_SITE_OTHER): Payer: PPO

## 2019-08-16 VITALS — Ht 66.0 in | Wt 206.0 lb

## 2019-08-16 DIAGNOSIS — Z0000 Encounter for general adult medical examination without abnormal findings: Secondary | ICD-10-CM | POA: Diagnosis not present

## 2019-08-16 NOTE — Progress Notes (Addendum)
Subjective:   Morgan Peterson is a 68 y.o. female who presents for Medicare Annual (Subsequent) preventive examination.  Virtual Visit via Telephone Note  I connected with Morgan Peterson on 08/16/19 at 10:40 AM EDT by telephone and verified that I am speaking with the correct person using two identifiers.  Medicare Annual Wellness visit completed telephonically due to Covid-19 pandemic.   Location: Patient: home  Provider: office   I discussed the limitations, risks, security and privacy concerns of performing an evaluation and management service by telephone and the availability of in person appointments. The patient expressed understanding and agreed to proceed.  Some vital signs may be absent or patient reported.   Clemetine Marker, LPN    Review of Systems:   Cardiac Risk Factors include: advanced age (>104mn, >>44women);diabetes mellitus;dyslipidemia;hypertension;obesity (BMI >30kg/m2)      Objective:     Vitals: Ht _0  (1.676 m)   Wt 206 lb (93.4 kg)   BMI 33.25 kg/m   Body mass index is 33.25 kg/m.  Advanced Directives 08/16/2019 08/18/2017 05/02/2017 03/23/2017 10/28/2016 09/03/2016 06/02/2016  Does Patient Have a Medical Advance Directive? _1  No Yes  Type of Advance Directive - - - - - - HPress photographerLiving will  Does patient want to make changes to medical advance directive? Yes (MAU/Ambulatory/Procedural Areas - Information given) - - - - - No - Patient declined  [place] Would patient like information on creating a medical advance directive? - - - - No - patient declined information No - patient declined information -    Tobacco Social History   Tobacco Use  Smoking Status Former Smoker  . Packs/day: 0.25  . Years: 10.00  . Pack years: 2.50  . Types: Cigarettes  Smokeless Tobacco Never Used  Tobacco Comment   smoking cessation materials not required     Counseling given:  Not Answered Comment: smoking cessation materials not required   Clinical Intake:  Pre-visit preparation completed: Yes  Pain : 0-10 Pain Score: 4  Pain Type: Chronic pain Pain Location: Other (Comment)(sciatic) Pain Descriptors / Indicators: Aching, Discomfort Pain Onset: More than a month ago Pain Frequency: Constant     BMI - recorded: 33.25 Nutritional Status: BMI > 30  Obese Nutritional Risks: None Diabetes: Yes CBG done?: No Did pt. bring in CBG monitor from home?: No   Nutrition Risk Assessment:  Has the patient had any N/V/D within the last 2 months?  No  Does the patient have any non-healing wounds?  No  Has the patient had any unintentional weight loss or weight gain?  No   Diabetes:  Is the patient diabetic?  Yes  If diabetic, was a CBG obtained today?  No  Did the patient bring in their glucometer from home?  No  How often do you monitor your CBG's? Daily fasting in AM.   Financial Strains and Diabetes Management:  Are you having any financial strains with the device, your supplies or your medication? No .  Does the patient want to be seen by Chronic Care Management for management of their diabetes?  No  Would the patient like to be referred to a Nutritionist or for Diabetic Management?  No   Diabetic Exams:  Diabetic Eye Exam: Completed per patient, awaiting records; negative retinopathy.  Diabetic Foot Exam: Completed 01/20/19.   How often do you need to have  someone help you when you read instructions, pamphlets, or other written materials from your doctor or pharmacy?: 1 - Never  Interpreter Needed?: No  Information entered by :: Clemetine Marker LPN  Past Medical History:  Diagnosis Date  . Cancer [facility]) 2010   Right Breast  . Diabetes mellitus without complication ([place]) 9417  . Hypertension   . IBS (irritable bowel syndrome)   . 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 Arimidex therapy completed September 2015  . Personal history of malignant neoplasm of breast 2010  . Personal history of radiation therapy 2010   mammosite   Past Surgical History:  Procedure Laterality Date  . ABDOMINAL EXPLORATION SURGERY  2000  . BREAST BIOPSY Right 2010   +   . BREAST EXCISIONAL BIOPSY Right 2010   + mammo site inasive mammo ca  . BREAST LUMPECTOMY Right 2010   [place]  . COLONOSCOPY  07/19/2012   Normal exam, Dr. Bary Castilla  . TUBAL LIGATION  20 years ago   Family History  Problem Relation Age of Onset  . Hypertension Mother   . Hyperlipidemia Mother   . Diabetes Father   . Kidney disease Father   . Diabetes Brother   . Colon cancer Maternal Uncle   . Breast cancer Maternal Aunt    Social History   Socioeconomic History  . Marital status: Married    Spouse name: Antonio  . Number of children: 2  . Years of education: Not on file  . Highest education level: Not on file  Occupational History  . 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 Financial resource strain: Not hard at all  . Food insecurity    Worry: Never true    Inability: Never true  . Transportation needs    Medical: No    Non-medical: No  Tobacco Use  . Smoking status: Former Smoker    Packs/day: 0.25    Years: 10.00    Pack years: 2.50    Types: Cigarettes  . Smokeless tobacco: Never Used  . Tobacco comment: smoking cessation materials not required  Substance and Sexual Activity  . Alcohol use: Yes    Alcohol/week: 0.0 standard drinks    Comment: wine  . Drug use: No  . Sexual activity: Not Currently    Comment: husband has ED  Lifestyle  . Physical activity    Days per week: 2 days    Minutes per session: 60 min  . Stress: Rather much  Relationships  . Social connections    Talks on phone: More than three times a week    Gets together: More than  three times a week    Attends religious service: More than 4 times per year    Active member of club or organization: Yes    Attends meetings of clubs or organizations: More than 4 times per year    Relationship status: Married  Other Topics Concern  . Not on file  Social History Narrative  . Not on file    Outpatient Encounter Medications as of 08/16/2019  Medication Sig  . aspirin EC 81 MG tablet Take 1 tablet (81 mg total) by mouth daily.  . Blood Glucose Monitoring Suppl (ONETOUCH VERIO) w/Device KIT 1 Device by Does not apply route once.  Marland Kitchen  Calcium Carb-Cholecalciferol 200-10 MG-MCG CAPS Take by mouth.  . cetirizine (ZYRTEC) 10 MG tablet Take 10 mg by mouth daily.  . cholecalciferol (VITAMIN D) 1000 UNITS tablet Take 1,000 Units by mouth daily.  . fluticasone (FLONASE) 50 MCG/ACT nasal spray SPRAY 2 SPRAYS INTO EACH NOSTRIL EVERY DAY  . glucose blood (ONETOUCH VERIO) test strip Use as instructed  . Lancets (ONETOUCH ULTRASOFT) lancets Use as instructed  . montelukast (SINGULAIR) 10 MG tablet TAKE 1 TABLET BY MOUTH EVERYDAY AT BEDTIME  . Multiple Vitamin (MULTIVITAMIN) LIQD Take 5 mLs by mouth daily.  . NON FORMULARY CBD oil, patients takes a few drops each morning  . OZEMPIC, 0.25 OR 0.5 MG/DOSE, 2 MG/1.5ML SOPN Inject 0.5 mg into the skin once a week.  . rosuvastatin (CRESTOR) 5 MG tablet Take 1 tablet (5 mg total) by mouth daily.  Marland Kitchen tiZANidine (ZANAFLEX) 4 MG tablet Take 1 tablet (4 mg total) by mouth every 6 (six) hours as needed for muscle spasms.  Marland Kitchen triamterene-hydrochlorothiazide (MAXZIDE-25) 37.5-25 MG tablet Take 1 tablet by mouth daily.  . vitamin B-12 (CYANOCOBALAMIN) 1000 MCG tablet Take 1 tablet by mouth 3 (three) times a week.   Marland Kitchen ZINC-VITAMIN C PO Take 1 tablet by mouth daily.  Marland Kitchen atenolol (TENORMIN) 25 MG tablet TAKE 1 TABLET BY MOUTH EVERY DAY (Patient not taking: Reported on 08/16/2019)  . [DISCONTINUED] calcium carbonate 200 MG capsule Take 200 mg by mouth 2 (two)  times daily with a meal. With Vitamin D3  . [DISCONTINUED] loratadine (CLARITIN) 10 MG tablet Take 1 tablet (10 mg total) by mouth daily.  . [DISCONTINUED] promethazine-codeine (PHENERGAN WITH CODEINE) 6.25-10 MG/5ML syrup Take 5 mLs by mouth at bedtime as needed for cough.   No facility-administered encounter medications on file as of 08/16/2019.     Activities of Daily Living In your present state of health, do you have any difficulty performing the following activities: 08/16/2019  Hearing? N  Comment declines hearing aids  Vision? N  Difficulty concentrating or making decisions? N  Walking or climbing stairs? N  Dressing or bathing? N  Doing errands, shopping? N  Preparing Food and eating ? N  Using the Toilet? N  In the past six months, have you accidently leaked urine? N  Do you have problems with loss of bowel control? N  Managing your Medications? N  Managing your Finances? N  Housekeeping or managing your Housekeeping? N  Some recent data might be hidden    Patient Care Team: Steele Sizer, MD as PCP - General (Family Medicine) Bary Castilla, Forest Gleason, MD (General Surgery) Carloyn Manner, MD as Referring Physician (Otolaryngology) Steele Sizer, MD as Attending Physician (Family Medicine) Yolonda Kida, MD as Consulting Physician (Cardiology)    Assessment:   This is a routine wellness examination for Morgan Peterson.  Exercise Activities and Dietary recommendations Current Exercise Habits: Home exercise routine, Type of exercise: calisthenics;strength training/weights;walking, Time (Minutes): 60, Frequency (Times/Week): 2, Weekly Exercise (Minutes/Week): 120, Intensity: Moderate, Exercise limited by: None identified  Goals    . Weight (lb) < 200 lb (90.7 kg)     Pt would like to lose weight over the next year with healthy eating and physical activity        Fall Risk Fall Risk  08/16/2019 03/30/2019 02/27/2019 01/30/2019 12/05/2018  Falls in the past year? 0 0 0 0 0   Number falls in past yr: 0 0 - 0 0  Injury with Fall? 0 0 - 0 0  Follow up  Falls prevention discussed - - - -   FALL RISK PREVENTION PERTAINING TO THE HOME:  Any stairs in or around the home? Yes  If so, do they handrails? Yes   Home free of loose throw rugs in walkways, pet beds, electrical cords, etc? Yes  Adequate lighting in your home to reduce risk of falls? Yes   ASSISTIVE DEVICES UTILIZED TO PREVENT FALLS:  Life alert? No  Use of a cane, walker or w/c? No  Grab bars in the bathroom? No  Shower chair or bench in shower? No  Elevated toilet seat or a handicapped toilet? No   DME ORDERS:  DME order needed?  No   TIMED UP AND GO:  Was the test performed? No . Telephonic visit.  Education: Fall risk prevention has been discussed.  Intervention(s) required? No   Depression Screen PHQ 2/9 Scores 08/16/2019 03/30/2019 01/30/2019 12/05/2018  PHQ - 2 Score 0 0 0 0  PHQ- 9 Score - 0 - 0     Cognitive Function Pt declines 6CIT        Immunization History  Administered Date(s) Administered  . Influenza Split 11/13/2012  . Influenza, High Dose Seasonal PF 08/18/2017, 10/13/2018  . Influenza, Seasonal, Injecte, Preservative Fre 11/19/2010, 08/17/2011  . Influenza,inj,Quad PF,6+ Mos 11/07/2013, 11/21/2014, 10/03/2015, 09/03/2016  . Influenza-Unspecified 11/07/2013, 11/21/2014, 10/03/2015, 09/03/2016  . Pneumococcal Conjugate-13 05/14/2014  . Pneumococcal Polysaccharide-23 11/19/2010, 11/26/2016  . Tdap 11/19/2010  . Zoster 07/23/2013    Qualifies for Shingles Vaccine? Yes  Zostavax completed 2014. Due for Shingrix. Education has been provided regarding the importance of this vaccine. Pt has been advised to call insurance company to determine out of pocket expense. Advised may also receive vaccine at local pharmacy or Health Dept. Verbalized acceptance and understanding.  Tdap: Up to date  Flu Vaccine: Up to date  Pneumococcal Vaccine: Up to date   Screening Tests  Health Maintenance  Topic Date Due  . HEMOGLOBIN A1C  05/16/2019  . INFLUENZA VACCINE  07/21/2019  . OPHTHALMOLOGY EXAM  07/25/2019  . FOOT EXAM  01/31/2020  . URINE MICROALBUMIN  01/31/2020  . TETANUS/TDAP  11/19/2020  . MAMMOGRAM  02/18/2021  . COLONOSCOPY  07/19/2022  . DEXA SCAN  Completed  . Hepatitis C Screening  Completed  . PNA vac Low Risk Adult  Completed   Cancer Screenings:  Colorectal Screening: Completed 07/19/12. Repeat every 10 years;  Mammogram: Completed 02/19/19. Repeat every year.  Bone Density: Completed 09/04/14. Results reflect NORMAL. Repeat every 2 years. Pt will consider repeat screening next year at time of mammogram.    Lung Cancer Screening: (Low Dose CT Chest recommended if Age 43-80 years, 30 pack-year currently smoking OR have quit w/in 15years.) does not qualify.    Additional Screening:  Hepatitis C Screening: does qualify; Completed 11/22/12  Vision Screening: Recommended annual ophthalmology exams for early detection of glaucoma and other disorders of the eye. Is the patient up to date with their annual eye exam?  Yes  Who is the provider or what is the name of the office in which the pt attends annual eye exams? Dr. Ellin Mayhew  Dental Screening: Recommended annual dental exams for proper oral hygiene  Community Resource Referral:  CRR required this visit?  No      Plan:     I have personally reviewed and addressed the Medicare Annual Wellness questionnaire and have noted the following in the patient's chart:  A. Medical and social history B. Use of alcohol, tobacco or  illicit drugs  C. Current medications and supplements D. Functional ability and status E.  Nutritional status F.  Physical activity G. Advance directives H. List of other physicians I.  Hospitalizations, surgeries, and ER visits in previous 12 months J.  [place] such as hearing and vision if needed, cognitive and depression L. Referrals and appointments    In addition, I have reviewed and discussed with patient certain preventive protocols, quality metrics, and best practice recommendations. A written personalized care plan for preventive services as well as general preventive health recommendations were provided to patient.   Signed,  Clemetine Marker, LPN Nurse Health Advisor   Nurse Notes:

## 2019-08-16 NOTE — Patient Instructions (Signed)
Morgan Peterson , Thank you for taking time to come for your Medicare Wellness Visit. I appreciate your ongoing commitment to your health goals. Please review the following plan we discussed and let me know if I can assist you in the future.   Screening recommendations/referrals: Colonoscopy: done 07/19/12. Repeat in 2023. Mammogram: done 02/19/19 Bone Density: done 09/04/14 Recommended yearly ophthalmology/optometry visit for glaucoma screening and checkup Recommended yearly dental visit for hygiene and checkup  Vaccinations: Influenza vaccine: done 10/13/18 Pneumococcal vaccine: done 11/26/16 Tdap vaccine: done 11/19/10 Shingles vaccine: Shingrix discussed. Please contact your pharmacy for coverage information.   Advanced directives: Advance directive discussed with you today. I have provided a copy for you to complete at home and have notarized. Once this is complete please bring a copy in to our office so we can scan it into your chart.  Conditions/risks identified: Recommend healthy eating and physical activity for desired weight loss  Next appointment: Please follow up in one year for your Medicare Annual Wellness visit.     Preventive Care 45 Years and Older, Female Preventive care refers to lifestyle choices and visits with your health care provider that can promote health and wellness. What does preventive care include?  A yearly physical exam. This is also called an annual well check.  Dental exams once or twice a year.  Routine eye exams. Ask your health care provider how often you should have your eyes checked.  Personal lifestyle choices, including:  Daily care of your teeth and gums.  Regular physical activity.  Eating a healthy diet.  Avoiding tobacco and drug use.  Limiting alcohol use.  Practicing safe sex.  Taking low-dose aspirin every day.  Taking vitamin and mineral supplements as recommended by your health care provider. What happens during an annual  well check? The services and screenings done by your health care provider during your annual well check will depend on your age, overall health, lifestyle risk factors, and family history of disease. Counseling  Your health care provider may ask you questions about your:  Alcohol use.  Tobacco use.  Drug use.  Emotional well-being.  Home and relationship well-being.  Sexual activity.  Eating habits.  History of falls.  Memory and ability to understand (cognition).  Work and work Statistician.  Reproductive health. Screening  You may have the following tests or measurements:  Height, weight, and BMI.  Blood pressure.  Lipid and cholesterol levels. These may be checked every 5 years, or more frequently if you are over 68 years old.  Skin check.  Lung cancer screening. You may have this screening every year starting at age 64 if you have a 30-pack-year history of smoking and currently smoke or have quit within the past 15 years.  Fecal occult blood test (FOBT) of the stool. You may have this test every year starting at age 57.  Flexible sigmoidoscopy or colonoscopy. You may have a sigmoidoscopy every 5 years or a colonoscopy every 10 years starting at age 20.  Hepatitis C blood test.  Hepatitis B blood test.  Sexually transmitted disease (STD) testing.  Diabetes screening. This is done by checking your blood sugar (glucose) after you have not eaten for a while (fasting). You may have this done every 1-3 years.  Bone density scan. This is done to screen for osteoporosis. You may have this done starting at age 12.  Mammogram. This may be done every 1-2 years. Talk to your health care provider about how often you should have  regular mammograms. Talk with your health care provider about your test results, treatment options, and if necessary, the need for more tests. Vaccines  Your health care provider may recommend certain vaccines, such as:  Influenza vaccine. This  is recommended every year.  Tetanus, diphtheria, and acellular pertussis (Tdap, Td) vaccine. You may need a Td booster every 10 years.  Zoster vaccine. You may need this after age 77.  Pneumococcal 13-valent conjugate (PCV13) vaccine. One dose is recommended after age 36.  Pneumococcal polysaccharide (PPSV23) vaccine. One dose is recommended after age 23. Talk to your health care provider about which screenings and vaccines you need and how often you need them. This information is not intended to replace advice given to you by your health care provider. Make sure you discuss any questions you have with your health care provider. Document Released: 01/02/2016 Document Revised: 08/25/2016 Document Reviewed: 10/07/2015 Elsevier Interactive Patient Education  2017 [place] Prevention in the Home Falls can cause injuries. They can happen to people of all ages. There are many things you can do to make your home safe and to help prevent falls. What can I do on the outside of my home?  Regularly fix the edges of walkways and driveways and fix any cracks.  Remove anything that might make you trip as you walk through a door, such as a raised step or threshold.  Trim any bushes or trees on the path to your home.  Use bright outdoor lighting.  Clear any walking paths of anything that might make someone trip, such as rocks or tools.  Regularly check to see if handrails are loose or broken. Make sure that both sides of any steps have handrails.  Any raised decks and porches should have guardrails on the edges.  Have any leaves, snow, or ice cleared regularly.  Use sand or salt on walking paths during winter.  Clean up any spills in your garage right away. This includes oil or grease spills. What can I do in the bathroom?  Use night lights.  Install grab bars by the toilet and in the tub and shower. Do not use towel bars as grab bars.  Use non-skid mats or decals in the tub or  shower.  If you need to sit down in the shower, use a plastic, non-slip stool.  Keep the floor dry. Clean up any water that spills on the floor as soon as it happens.  Remove soap buildup in the tub or shower regularly.  Attach bath mats securely with double-sided non-slip rug tape.  Do not have throw rugs and other things on the floor that can make you trip. What can I do in the bedroom?  Use night lights.  Make sure that you have a light by your bed that is easy to reach.  Do not use any sheets or blankets that are too big for your bed. They should not hang down onto the floor.  Have a firm chair that has side arms. You can use this for support while you get dressed.  Do not have throw rugs and other things on the floor that can make you trip. What can I do in the kitchen?  Clean up any spills right away.  Avoid walking on wet floors.  Keep items that you use a lot in easy-to-reach places.  If you need to reach something above you, use a strong step stool that has a grab bar.  Keep electrical cords out of the  way.  Do not use floor polish or wax that makes floors slippery. If you must use wax, use non-skid floor wax.  Do not have throw rugs and other things on the floor that can make you trip. What can I do with my stairs?  Do not leave any items on the stairs.  Make sure that there are handrails on both sides of the stairs and use them. Fix handrails that are broken or loose. Make sure that handrails are as long as the stairways.  Check any carpeting to make sure that it is firmly attached to the stairs. Fix any carpet that is loose or worn.  Avoid having throw rugs at the top or bottom of the stairs. If you do have throw rugs, attach them to the floor with carpet tape.  Make sure that you have a light switch at the top of the stairs and the bottom of the stairs. If you do not have them, ask someone to add them for you. What else can I do to help prevent falls?   Wear shoes that:  Do not have high heels.  Have rubber bottoms.  Are comfortable and fit you well.  Are closed at the toe. Do not wear sandals.  If you use a stepladder:  Make sure that it is fully opened. Do not climb a closed stepladder.  Make sure that both sides of the stepladder are locked into place.  Ask someone to hold it for you, if possible.  Clearly mark and make sure that you can see:  Any grab bars or handrails.  First and last steps.  Where the edge of each step is.  Use tools that help you move around (mobility aids) if they are needed. These include:  Canes.  Walkers.  Scooters.  Crutches.  Turn on the lights when you go into a dark area. Replace any light bulbs as soon as they burn out.  Set up your furniture so you have a clear path. Avoid moving your furniture around.  If any of your floors are uneven, fix them.  If there are any pets around you, be aware of where they are.  Review your medicines with your doctor. Some medicines can make you feel dizzy. This can increase your chance of falling. Ask your doctor what other things that you can do to help prevent falls. This information is not intended to replace advice given to you by your health care provider. Make sure you discuss any questions you have with your health care provider. Document Released: 10/02/2009 Document Revised: 05/13/2016 Document Reviewed: 01/10/2015 Elsevier Interactive Patient Education  2017 [employer].

## 2019-08-22 ENCOUNTER — Ambulatory Visit (INDEPENDENT_AMBULATORY_CARE_PROVIDER_SITE_OTHER): Payer: PPO | Admitting: Nurse Practitioner

## 2019-08-22 ENCOUNTER — Ambulatory Visit: Payer: Self-pay | Admitting: *Deleted

## 2019-08-22 ENCOUNTER — Encounter: Payer: Self-pay | Admitting: Nurse Practitioner

## 2019-08-22 VITALS — Temp 97.8°F | Resp 16

## 2019-08-22 DIAGNOSIS — J0140 Acute pansinusitis, unspecified: Secondary | ICD-10-CM

## 2019-08-22 DIAGNOSIS — R51 Headache: Secondary | ICD-10-CM | POA: Diagnosis not present

## 2019-08-22 DIAGNOSIS — R519 Headache, unspecified: Secondary | ICD-10-CM

## 2019-08-22 MED ORDER — IBUPROFEN 600 MG PO TABS: 600.0000 mg | ORAL_TABLET | Freq: Three times a day (TID) | ORAL | 0 refills | Status: DC | PRN

## 2019-08-22 MED ORDER — AMOXICILLIN-POT CLAVULANATE 875-125 MG PO TABS: 1.0000 | ORAL_TABLET | Freq: Two times a day (BID) | ORAL | 0 refills | Status: DC

## 2019-08-22 NOTE — Progress Notes (Signed)
Virtual Visit via Video Note  I connected with Morgan Peterson on 08/22/19 at  2:40 PM EDT by a video enabled telemedicine application and verified that I am speaking with the correct person using two identifiers.   Staff discussed the limitations of evaluation and management by telemedicine and the availability of in person appointments. The patient expressed understanding and agreed to proceed.  Patient location: work My location: work  office Other people present:  none HPI  Patient endorses bilateral sinus pressure in face and ear fullness. States lots of facial pain and pressure. Was having headaches intermittently last week. Has been drinking lots of water, taking acetaminophen and ibuprofen with moderate temporary relief. Has been using zyrtec, singulair and flonase without any relief. Has been ongoing for 2 weeks. Gets a sinus infection usually once a year.   PHQ2/9: Depression screen [facility] 2/9 08/22/2019 08/22/2019 08/16/2019 03/30/2019 01/30/2019  Decreased Interest 0 0 0 0 0  Down, Depressed, Hopeless 0 0 0 0 0  PHQ - 2 Score 0 0 0 0 0  Altered sleeping 0 0 - 0 -  Tired, decreased energy 0 0 - 0 -  Change in appetite 0 0 - 0 -  Feeling bad or failure about yourself  0 0 - 0 -  Trouble concentrating 0 0 - 0 -  Moving slowly or fidgety/restless 0 0 - 0 -  Suicidal thoughts 0 0 - 0 -  PHQ-9 Score 0 0 - 0 -  Difficult doing work/chores Not difficult at all Not difficult at all - Not difficult at all -    PHQ reviewed. Negative  Patient Active Problem List   Diagnosis Date Noted  . Chronic low back pain without sciatica 08/18/2017  . Irritable bowel syndrome with constipation 05/02/2017  . Vitamin D deficiency 09/09/2016  . Hiatal hernia 06/02/2016  . Allergic rhinitis, seasonal 06/02/2016  . Obesity (BMI 30-39.9) 04/30/2016  . Angio-edema 04/30/2016  . Hyperlipidemia 11/04/2015  . Hyperlipidemia due to type 2 diabetes mellitus ([place]) 11/04/2015  . Cervical radiculopathy at C6  07/31/2015  . Neuroma digital nerve 07/31/2015  . Type 2 diabetes mellitus with hyperglycemia, with long-term current use of insulin ([place]) 06/30/2015  . Right shoulder pain 06/30/2015  . Cervical disc disease 06/30/2015  . Essential hypertension 06/30/2015  . History of breast cancer in female 03/23/2009    Past Medical History:  Diagnosis Date  . Cancer [facility]) 2010   Right Breast  . Diabetes mellitus without complication ([place]) 2620  . Hypertension   . IBS (irritable bowel syndrome)   . 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 Arimidex therapy completed September 2015  . Personal history of malignant neoplasm of breast 2010  . Personal history of radiation therapy 2010   mammosite    Past Surgical History:  Procedure Laterality Date  . ABDOMINAL EXPLORATION SURGERY  2000  . BREAST BIOPSY Right 2010   +   . BREAST EXCISIONAL BIOPSY Right 2010   + mammo site inasive mammo ca  . BREAST LUMPECTOMY Right 2010   [place]  . COLONOSCOPY  07/19/2012   Normal exam, Dr. Bary Castilla  . TUBAL LIGATION  20 years ago    Social History   Tobacco Use  . Smoking status: Former Smoker    Packs/day: 0.25    Years: 10.00  Pack years: 2.50    Types: Cigarettes  . Smokeless tobacco: Never Used  . Tobacco comment: smoking cessation materials not required  Substance Use Topics  . Alcohol use: Yes    Alcohol/week: 0.0 standard drinks    Comment: wine     Current Outpatient Medications:  .  Ascorbic Acid (VITAMIN C) 100 MG tablet, Take 100 mg by mouth daily., Disp: , Rfl:  .  aspirin EC 81 MG tablet, Take 1 tablet (81 mg total) by mouth daily., Disp: 30 tablet, Rfl: 0 .  atenolol (TENORMIN) 25 MG tablet, TAKE 1 TABLET BY MOUTH EVERY DAY, Disp: 90 tablet, Rfl: 1 .  Calcium Carb-Cholecalciferol 200-10 MG-MCG CAPS, Take  by mouth., Disp: , Rfl:  .  cetirizine (ZYRTEC) 10 MG tablet, Take 10 mg by mouth daily., Disp: , Rfl:  .  cholecalciferol (VITAMIN D) 1000 UNITS tablet, Take 1,000 Units by mouth daily., Disp: , Rfl:  .  fluticasone (FLONASE) 50 MCG/ACT nasal spray, SPRAY 2 SPRAYS INTO EACH NOSTRIL EVERY DAY, Disp: 48 mL, Rfl: 0 .  montelukast (SINGULAIR) 10 MG tablet, TAKE 1 TABLET BY MOUTH EVERYDAY AT BEDTIME, Disp: 90 tablet, Rfl: 0 .  Multiple Vitamin (MULTIVITAMIN) LIQD, Take 5 mLs by mouth daily., Disp: , Rfl:  .  OZEMPIC, 0.25 OR 0.5 MG/DOSE, 2 MG/1.5ML SOPN, Inject 0.5 mg into the skin once a week., Disp: , Rfl:  .  rosuvastatin (CRESTOR) 5 MG tablet, Take 1 tablet (5 mg total) by mouth daily., Disp: 90 tablet, Rfl: 1 .  tiZANidine (ZANAFLEX) 4 MG tablet, Take 1 tablet (4 mg total) by mouth every 6 (six) hours as needed for muscle spasms., Disp: 30 tablet, Rfl: 0 .  triamterene-hydrochlorothiazide (MAXZIDE-25) 37.5-25 MG tablet, Take 1 tablet by mouth daily., Disp: 90 tablet, Rfl: 1 .  vitamin B-12 (CYANOCOBALAMIN) 1000 MCG tablet, Take 1 tablet by mouth 3 (three) times a week. , Disp: , Rfl:  .  ZINC-VITAMIN C PO, Take 1 tablet by mouth daily., Disp: , Rfl:  .  amoxicillin-clavulanate (AUGMENTIN) 875-125 MG tablet, Take 1 tablet by mouth 2 (two) times daily., Disp: 20 tablet, Rfl: 0 .  Blood Glucose Monitoring Suppl (ONETOUCH VERIO) w/Device KIT, 1 Device by Does not apply route once., Disp: 1 kit, Rfl: 0 .  glucose blood (ONETOUCH VERIO) test strip, Use as instructed, Disp: 100 each, Rfl: 12 .  ibuprofen (ADVIL) 600 MG tablet, Take 1 tablet (600 mg total) by mouth every 8 (eight) hours as needed., Disp: 30 tablet, Rfl: 0 .  Lancets (ONETOUCH ULTRASOFT) lancets, Use as instructed, Disp: 100 each, Rfl: 3 .  NON FORMULARY, CBD oil, patients takes a few drops each morning, Disp: , Rfl:   Allergies  Allergen Reactions  . Levaquin [Levofloxacin In D5w] Swelling    Other reaction(s): Arthralgia (Joint Pain)   . Losartan     Other reaction(s): Angioedema  . Amlodipine-Olmesartan   . Gabapentin     cns side effects.  . Glipizide     Other reaction(s): Abdominal pain  . Invokana [Canagliflozin] Other (See Comments)    Bladder pain  . Lisinopril Swelling    Angioedema  . Metformin And Related Nausea Only  . Onglyza [Saxagliptin] Other (See Comments)    Increased PVC's  . Tramadol Nausea And Vomiting  . Actos [Pioglitazone] Other (See Comments) and Nausea And Vomiting    Bladder pain  . Atorvastatin Other (See Comments)  . Carvedilol Other (See Comments)    NUMBNESS, TINGLING, ACHING IN  ARMS  . Prednisone Palpitations    ROS   No other specific complaints in a complete review of systems (except as listed in HPI above).  Objective  Vitals:   08/22/19 1401  Resp: 16  Temp: 97.8 F (36.6 C)     There is no height or weight on file to calculate BMI.  Nursing Note and Vital Signs reviewed.  Physical Exam   Constitutional: Patient appears well-developed and well-nourished. No distress.  HENT: Head: Normocephalic and atraumatic. Bilateral frontal and maxillary sinus pressure.  Pulmonary/Chest: Effort normal  Neurological: alert and oriented, speech normal.  Skin: No rash noted. No erythema.  Psychiatric: Patient has a normal mood and affect. behavior is normal. Judgment and thought content normal.    Assessment & Plan  1. Acute non-recurrent pansinusitis - amoxicillin-clavulanate (AUGMENTIN) 875-125 MG tablet; Take 1 tablet by mouth 2 (two) times daily.  Dispense: 20 tablet; Refill: 0  2. Acute intractable headache, unspecified headache type Discussed to take very sparingly with food and drink plenty of water.  - ibuprofen (ADVIL) 600 MG tablet; Take 1 tablet (600 mg total) by mouth every 8 (eight) hours as needed.  Dispense: 30 tablet; Refill: 0    Follow Up Instructions:    I discussed the assessment and treatment plan with the patient. The patient was provided  an opportunity to ask questions and all were answered. The patient agreed with the plan and demonstrated an understanding of the instructions.   The patient was advised to call back or seek an in-person evaluation if the symptoms worsen or if the condition fails to improve as anticipated.  I provided 11 minutes of non-face-to-face time during this encounter.   Fredderick Severance, NP

## 2019-08-22 NOTE — Telephone Encounter (Signed)
Sinus pain and pressure, bil ear pain, postnasal drip, and cough; this started 2 weeks ago; she has taken claritin, zyterc,ES tylenol; she says that her forehead and cheeks hurt; recommendations made per nurse triage protocol; she verbalized understanding; the pt sees Dr Ancil Boozer; pt transferred to [facility] for scheduling.    Reason for Disposition . [1] Sinus pain (not just congestion) AND [2] fever  Answer Assessment - Initial Assessment Questions 1. LOCATION: "Where does it hurt?"      Forehead and cheeks 2. ONSET: "When did the sinus pain start?"  (e.g., hours, days)      2 weeks ago 3. SEVERITY: "How bad is the pain?"   (Scale 1-10; mild, moderate or severe)   - MILD (1-3): doesn't interfere with normal activities    - MODERATE (4-7): interferes with normal activities (e.g., work or school) or awakens from sleep   - SEVERE (8-10): excruciating pain and patient unable to do any normal activities      8 out of 10 4. RECURRENT SYMPTOM: "Have you ever had sinus problems before?" If so, ask: "When was the last time?" and "What happened that time?"      Yes, sinus infections 5. NASAL CONGESTION: "Is the nose blocked?" If so, ask, "Can you open it or must you breathe through the mouth?"    no 6. NASAL DISCHARGE: "Do you have discharge from your nose?" If so ask, "What color?"     clear 7. FEVER: "Do you have a fever?" If so, ask: "What is it, how was it measured, and when did it start?"     Tempt 99 on 08/22/2019 8. OTHER SYMPTOMS: "Do you have any other symptoms?" (e.g., sore throat, cough, earache, difficulty breathing)     Post nasal drip, cough, ears "have heat inside"; ears sensitive to sound 9. PREGNANCY: "Is there any chance you are pregnant?" "When was your last menstrual period?"    no  Protocols used: SINUS PAIN OR CONGESTION-A-AH

## 2019-08-23 DIAGNOSIS — Z20828 Contact with and (suspected) exposure to other viral communicable diseases: Secondary | ICD-10-CM | POA: Diagnosis not present

## 2019-09-09 ENCOUNTER — Other Ambulatory Visit: Payer: Self-pay | Admitting: Family Medicine

## 2019-09-09 DIAGNOSIS — E1169 Type 2 diabetes mellitus with other specified complication: Secondary | ICD-10-CM

## 2019-09-25 ENCOUNTER — Other Ambulatory Visit: Payer: Self-pay | Admitting: Family Medicine

## 2019-09-25 DIAGNOSIS — J302 Other seasonal allergic rhinitis: Secondary | ICD-10-CM

## 2019-10-09 ENCOUNTER — Encounter: Payer: Self-pay | Admitting: Family Medicine

## 2019-10-09 ENCOUNTER — Ambulatory Visit (INDEPENDENT_AMBULATORY_CARE_PROVIDER_SITE_OTHER): Payer: PPO | Admitting: Family Medicine

## 2019-10-09 ENCOUNTER — Other Ambulatory Visit: Payer: Self-pay

## 2019-10-09 ENCOUNTER — Ambulatory Visit: Payer: Self-pay | Admitting: *Deleted

## 2019-10-09 VITALS — BP 144/78 | HR 92 | Temp 97.3°F | Resp 16 | Ht 66.0 in | Wt 199.0 lb

## 2019-10-09 DIAGNOSIS — T783XXD Angioneurotic edema, subsequent encounter: Secondary | ICD-10-CM | POA: Diagnosis not present

## 2019-10-09 DIAGNOSIS — Z23 Encounter for immunization: Secondary | ICD-10-CM

## 2019-10-09 DIAGNOSIS — I10 Essential (primary) hypertension: Secondary | ICD-10-CM

## 2019-10-09 DIAGNOSIS — L509 Urticaria, unspecified: Secondary | ICD-10-CM | POA: Diagnosis not present

## 2019-10-09 MED ORDER — EPINEPHRINE 0.3 MG/0.3ML IJ SOAJ: 0.3000 mg | INTRAMUSCULAR | 0 refills | Status: DC | PRN

## 2019-10-09 MED ORDER — FAMOTIDINE 40 MG PO TABS: 40.0000 mg | ORAL_TABLET | Freq: Two times a day (BID) | ORAL | 0 refills | Status: DC

## 2019-10-09 NOTE — Patient Instructions (Signed)
Anaphylactic Reaction, Adult An anaphylactic reaction (anaphylaxis) is a sudden, severe allergic reaction by the body's disease-fighting system (immune system). Anaphylaxis can be life-threatening. This condition must be treated right away. Sometimes a person may need to be treated in the hospital. What are the causes? This condition is caused by exposure to a substance that you are allergic to (allergen). In response to this exposure, the body releases proteins (antibodies) and other compounds, such as histamine, into the bloodstream. This causes swelling in certain tissues and loss of blood pressure to important areas, such as the heart and lungs. Common allergens that can cause anaphylaxis include:  Foods, especially peanuts, wheat, shellfish, milk, and eggs.  Medicines.  Insect bites or stings.  Blood or parts of blood received for treatment (transfusions).  Chemicals, such as dyes, latex, and contrast material that is used for medical tests. What increases the risk? This condition is more likely to occur in people who:  Have allergies.  Have had anaphylaxis before.  Have a family history of anaphylaxis.  Have certain medical conditions, including asthma and eczema. What are the signs or symptoms? Symptoms of anaphylaxis may include:  Feeling warm in the face (flushed). This may include redness.  Itchy, red, swollen areas of skin (hives).  Swelling of the eyes, lips, face, mouth, tongue, or throat.  Difficulty breathing, speaking, or swallowing.  Noisy breathing (wheezing).  Dizziness or light-headedness.  Fainting.  Pain or cramping in the abdomen.  Vomiting.  Diarrhea. How is this diagnosed? This condition is diagnosed based on:  Your symptoms.  A physical exam.  Blood tests.  Recent history of exposure to allergens. How is this treated? If you think you are having an anaphylactic reaction, you should do the following right away:  Give yourself an  epinephrine injection using what is commonly called an auto-injector "pen" (pre-filled automatic epinephrine injection device). Your health care provider will teach you how to use an auto-injector pen.  Call for emergency help. If you use a pen, you must still get emergency medical treatment in the hospital. Treatment in the hospital may include: ? Medicines to help:  Tighten your blood vessels (epinephrine).  Relieve itching and hives (antihistamines).  Reduce swelling (corticosteroids). ? Oxygen therapy to help you breathe. ? IV fluids to keep you hydrated. Follow these instructions at home: Safety  Always keep an auto-injector pen near you. This can be lifesaving if you have a severe anaphylactic reaction. Use your auto-injector pen as told by your health care provider.  Do not drive after an anaphylactic reaction until your health care provider approves.  Make sure that you, the members of your household, and your employer know: ? What you are allergic to, so it can be avoided. ? How to use an auto-injector pen to give you an epinephrine injection.  Replace your epinephrine immediately after you use your auto-injector pen. This is important if you have another reaction.  If told by your health care provider, wear a medical alert bracelet or necklace that states your allergy.  Learn the signs of anaphylaxis so that you can recognize and treat it right away.  Work with your health care providers to make an anaphylaxis plan. Preparation is important. General instructions  If you have hives or rash: ? Use an over-the-counter antihistamine as told by your health care provider. ? Apply cold, wet cloths (cold compresses) to your skin or take baths or showers in cool water. Avoid hot water.  Take over-the-counter and prescription medicines only   as told by your health care provider.  Tell all your health care providers that you have an allergy.  Keep all follow-up visits as told by  your health care provider. This is important. How is this prevented?  Avoid allergens that have caused an anaphylactic reaction in the past.  When you are at a restaurant, tell your server that you have an allergy. If you are not sure whether a menu item contains an ingredient that you are allergic to, ask your server. Where to find more information  American Academy of Allergy, Asthma and Immunology: aaaai.org  American Academy of Pediatrics: healthychildren.org Get help right away if:  You develop symptoms of an allergic reaction. You may notice them soon after you are exposed to a substance. Symptoms may include: ? Flushed skin. ? Hives. ? Swelling of the eyes, lips, face, mouth, tongue, or throat. ? Difficulty breathing, speaking, or swallowing. ? Wheezing. ? Dizziness or light-headedness. ? Fainting. ? Pain or cramping in the abdomen. ? Vomiting. ? Diarrhea.  You used epinephrine. You need more medical care even if the medicine seems to be working. This is important because anaphylaxis may happen again within 72 hours (rebound anaphylaxis). You may need more doses of epinephrine. These symptoms may represent a serious problem that is an emergency. Do not wait to see if the symptoms will go away. Do the following right away:  Use the auto-injector pen as you have been instructed.  Get medical help. Call your local emergency services (911 in the [place] Do not drive yourself to the hospital. Summary  An anaphylactic reaction (anaphylaxis) is a sudden, severe allergic reaction by the body's disease-fighting system (immune system).  This condition can be life-threatening. If you have an anaphylactic reaction, get medical help right away.  Your health care provider may teach you how to use an auto-injector "pen" (pre-filled automatic epinephrine injection device) to give yourself a shot.  Always keep an auto-injector pen with you. This could save your life. Use it as told by  your health care provider.  If you use epinephrine, you must still get emergency medical treatment, even if the medicine seems to be working. This information is not intended to replace advice given to you by your health care provider. Make sure you discuss any questions you have with your health care provider. Document Released: 12/06/2005 Document Revised: 03/30/2018 Document Reviewed: 03/30/2018 Elsevier Patient Education  2020 [employer].

## 2019-10-09 NOTE — Telephone Encounter (Signed)
Patient called with concerns that she is having a reaction to something she ate. Patient is at store and asked to be called back in 10 minutes- call disconnected.

## 2019-10-09 NOTE — Telephone Encounter (Signed)
Patient calling to report she has had 2 reactions to food and she is afraid to eat. Call to office for appointment Reason for Disposition . [1] MODERATE-SEVERE hives persist (i.e., hives interfere with normal activities or work) AND [2] taking antihistamine (e.g., Benadryl, Claritin) > 24 hours  Answer Assessment - Initial Assessment Questions 1. APPEARANCE: "What does the rash look like?"     Red raised areas 2. LOCATION: "Where is the rash located?"      R side of lip, chest, arms neck 3. NUMBER: "How many hives are there?"      Bumps and bloched 4. SIZE: "How big are the hives?" (inches, cm, compare to coins) "Do they all look the same or is there lots of variation in shape and size?"      Large areas- dime size-  Bumps- dimes sive 5. ONSET: "When did the hives begin?" (Hours or days ago)      Sunday 3:00 6. ITCHING: "Does it itch?" If so, ask: "How bad is the itch?"    - MILD: doesn't interfere with normal activities   - MODERATE-SEVERE: interferes with work, school, sleep, or other activities      Itching-moderate/mild 7. RECURRENT PROBLEM: "Have you had hives before?" If so, ask: "When was the last time?" and "What happened that time?"      No- not allergic to food, patient had reaction to plant 8. TRIGGERS: "Were you exposed to any new food, plant, cosmetic product or animal just before the hives began?"     No new foods 9. OTHER SYMPTOMS: "Do you have any other symptoms?" (e.g., fever, tongue swelling, difficulty breathing, abdominal pain)     no 10. PREGNANCY: "Is there any chance you are pregnant?" "When was your last menstrual period?"       n/a  Protocols used: HIVES-A-AH

## 2019-10-09 NOTE — Progress Notes (Signed)
Name: Morgan Peterson   MRN: [id number]    DOB: [birth year]   Date:10/09/2019       Progress Note  Subjective  Chief Complaint  Chief Complaint  Patient presents with  . Urticaria    Went to [employer] Sunday, started having swelling under her neck, itching, shiny patches on her face- took Benadryl last night and woke up again and symptoms returned.   [facility]    Everytime she has ate she gets itchy, welps under on her face.    HPI  Hives: two days ago she  was at [employer] and was eating a cheese dip and pretzels and within minutes she developed hives , on her neck and face, it got progressively worse but she did not developed sob or wheezing. She kept eating and finished her dinner that night. She went home and still had symptoms the following day but not as itchy. She went to [employer] and developed another episode of hives on her face and associated with itchy lips and angioedema of right upper lip. She states previous episode of hives was at age 68 , from inhaling flowers. She took Benadryl last night, but still has pruritus and rash on her face today. No previous allergy evaluation. Today she was afraid to eat, she ate chicken biscuit from [place] today at 11 am and in 2 hours she developed stinging and burning on her face. She has Zyrtec at home   No change in medication, cleaning products, hygiene products, laundry detergents or anything that she can think of   HTN: bp is slightly elevated , she has been taking medication as prescribed Atenolol and Triamterene HCTZ,  denies chest pain or palpitation, no side effects. Allergic to ACE - angioedema. She does not want me to adjust her medications at this time   Patient Active Problem List   Diagnosis Date Noted  . Chronic low back pain without sciatica 08/18/2017  . Irritable bowel syndrome with constipation 05/02/2017  . Vitamin D deficiency 09/09/2016  . Hiatal hernia 06/02/2016  . Allergic rhinitis, seasonal 06/02/2016   . Obesity (BMI 30-39.9) 04/30/2016  . Angio-edema 04/30/2016  . Hyperlipidemia 11/04/2015  . Hyperlipidemia due to type 2 diabetes mellitus ([place]) 11/04/2015  . Cervical radiculopathy at C6 07/31/2015  . Neuroma digital nerve 07/31/2015  . Type 2 diabetes mellitus with hyperglycemia, with long-term current use of insulin ([place]) 06/30/2015  . Right shoulder pain 06/30/2015  . Cervical disc disease 06/30/2015  . Essential hypertension 06/30/2015  . History of breast cancer in female 03/23/2009    Past Surgical History:  Procedure Laterality Date  . ABDOMINAL EXPLORATION SURGERY  2000  . BREAST BIOPSY Right 2010   +   . BREAST EXCISIONAL BIOPSY Right 2010   + mammo site inasive mammo ca  . BREAST LUMPECTOMY Right 2010   [place]  . COLONOSCOPY  07/19/2012   Normal exam, Dr. Bary Castilla  . TUBAL LIGATION  20 years ago    Family History  Problem Relation Age of Onset  . Hypertension Mother   . Hyperlipidemia Mother   . Diabetes Father   . Kidney disease Father   . Diabetes Brother   . Colon cancer Maternal Uncle   . Breast cancer Maternal Aunt     Social History   Socioeconomic History  . Marital status: Married    Spouse name: Antonio  . Number of children: 2  . Years of education: Not on file  . Highest education level:  Not on file  Occupational History  . 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 Financial resource strain: Not hard at all  . Food insecurity    Worry: Never true    Inability: Never true  . Transportation needs    Medical: No    Non-medical: No  Tobacco Use  . Smoking status: Former Smoker    Packs/day: 0.25    Years: 10.00    Pack years: 2.50    Types: Cigarettes  . Smokeless tobacco: Never Used  . Tobacco comment: smoking cessation materials not required  Substance and Sexual Activity  . Alcohol use: Yes    Alcohol/week: 0.0 standard drinks    Comment: wine  . Drug use: No  . Sexual activity: Not Currently    Comment:  husband has ED  Lifestyle  . Physical activity    Days per week: 2 days    Minutes per session: 60 min  . Stress: Rather much  Relationships  . Social connections    Talks on phone: More than three times a week    Gets together: More than three times a week    Attends religious service: More than 4 times per year    Active member of club or organization: Yes    Attends meetings of clubs or organizations: More than 4 times per year    Relationship status: Married  . Intimate partner violence    Fear of current or ex partner: No    Emotionally abused: No    Physically abused: No    Forced sexual activity: No  Other Topics Concern  . Not on file  Social History Narrative  . Not on file     Current Outpatient Medications:  .  amoxicillin-clavulanate (AUGMENTIN) 875-125 MG tablet, Take 1 tablet by mouth 2 (two) times daily., Disp: 20 tablet, Rfl: 0 .  Ascorbic Acid (VITAMIN C) 100 MG tablet, Take 100 mg by mouth daily., Disp: , Rfl:  .  aspirin EC 81 MG tablet, Take 1 tablet (81 mg total) by mouth daily., Disp: 30 tablet, Rfl: 0 .  atenolol (TENORMIN) 25 MG tablet, TAKE 1 TABLET BY MOUTH EVERY DAY, Disp: 90 tablet, Rfl: 1 .  Blood Glucose Monitoring Suppl (ONETOUCH VERIO) w/Device KIT, 1 Device by Does not apply route once., Disp: 1 kit, Rfl: 0 .  Calcium Carb-Cholecalciferol 200-10 MG-MCG CAPS, Take by mouth., Disp: , Rfl:  .  cetirizine (ZYRTEC) 10 MG tablet, Take 10 mg by mouth daily., Disp: , Rfl:  .  cholecalciferol (VITAMIN D) 1000 UNITS tablet, Take 1,000 Units by mouth daily., Disp: , Rfl:  .  fluticasone (FLONASE) 50 MCG/ACT nasal spray, SPRAY 2 SPRAYS INTO EACH NOSTRIL EVERY DAY, Disp: 48 mL, Rfl: 0 .  glucose blood (ONETOUCH VERIO) test strip, Use as instructed, Disp: 100 each, Rfl: 12 .  ibuprofen (ADVIL) 600 MG tablet, Take 1 tablet (600 mg total) by mouth every 8 (eight) hours as needed., Disp: 30 tablet, Rfl: 0 .  Lancets (ONETOUCH ULTRASOFT) lancets, Use as  instructed, Disp: 100 each, Rfl: 3 .  montelukast (SINGULAIR) 10 MG tablet, TAKE 1 TABLET BY MOUTH EVERYDAY AT BEDTIME, Disp: 90 tablet, Rfl: 0 .  Multiple Vitamin (MULTIVITAMIN) LIQD, Take 5 mLs by mouth daily., Disp: , Rfl:  .  NON FORMULARY, CBD oil, patients takes a few drops each morning, Disp: , Rfl:  .  OZEMPIC, 0.25 OR 0.5 MG/DOSE, 2 MG/1.5ML SOPN, Inject 0.5 mg  into the skin once a week., Disp: , Rfl:  .  rosuvastatin (CRESTOR) 5 MG tablet, TAKE 1 TABLET BY MOUTH EVERY DAY, Disp: 90 tablet, Rfl: 1 .  tiZANidine (ZANAFLEX) 4 MG tablet, Take 1 tablet (4 mg total) by mouth every 6 (six) hours as needed for muscle spasms., Disp: 30 tablet, Rfl: 0 .  triamterene-hydrochlorothiazide (MAXZIDE-25) 37.5-25 MG tablet, Take 1 tablet by mouth daily., Disp: 90 tablet, Rfl: 1 .  vitamin B-12 (CYANOCOBALAMIN) 1000 MCG tablet, Take 1 tablet by mouth 3 (three) times a week. , Disp: , Rfl:  .  ZINC-VITAMIN C PO, Take 1 tablet by mouth daily., Disp: , Rfl:   Allergies  Allergen Reactions  . Levaquin [Levofloxacin In D5w] Swelling    Other reaction(s): Arthralgia (Joint Pain)  . Losartan     Other reaction(s): Angioedema  . Amlodipine-Olmesartan   . Gabapentin     cns side effects.  . Glipizide     Other reaction(s): Abdominal pain  . Invokana [Canagliflozin] Other (See Comments)    Bladder pain  . Lisinopril Swelling    Angioedema  . Metformin And Related Nausea Only  . Onglyza [Saxagliptin] Other (See Comments)    Increased PVC's  . Tramadol Nausea And Vomiting  . Actos [Pioglitazone] Other (See Comments) and Nausea And Vomiting    Bladder pain  . Atorvastatin Other (See Comments)  . Carvedilol Other (See Comments)    NUMBNESS, TINGLING, ACHING IN ARMS  . Prednisone Palpitations    I personally reviewed active problem list, medication list, allergies, family history, social history, health maintenance with the patient/caregiver today.   ROS  Constitutional: Negative for fever or  weight change.  Respiratory: Negative for cough and shortness of breath.   Cardiovascular: Negative for chest pain or palpitations.  Gastrointestinal: Negative for abdominal pain, no bowel changes.  Musculoskeletal: Negative for gait problem or joint swelling.  Skin: Positive  for rash.  Neurological: Negative for dizziness or headache.  No other specific complaints in a complete review of systems (except as listed in HPI above).   Objective  Vitals:   10/09/19 1537  BP: (!) 144/78  Pulse: 92  Resp: 16  Temp: (!) 97.3 F (36.3 C)  TempSrc: Temporal  SpO2: 98%  Weight: 199 lb (90.3 kg)  Height: 5' 6" (1.676 m)    Body mass index is 32.12 kg/m.  Physical Exam  Constitutional: Patient appears well-developed and well-nourished. Obese No distress.  HEENT: head atraumatic, normocephalic, pupils equal and reactive to light Cardiovascular: Normal rate, regular rhythm and normal heart sounds.  No murmur heard. No BLE edema. Pulmonary/Chest: Effort normal and breath sounds normal. No respiratory distress. Abdominal: Soft.  There is no tenderness. Skin: hives on face , neck and back  Psychiatric: Patient has a normal mood and affect. behavior is normal. Judgment and thought content normal.  PHQ2/9: Depression screen [facility] 2/9 10/09/2019 08/22/2019 08/22/2019 08/16/2019 03/30/2019  Decreased Interest 0 0 0 0 0  Down, Depressed, Hopeless 0 0 0 0 0  PHQ - 2 Score 0 0 0 0 0  Altered sleeping 0 0 0 - 0  Tired, decreased energy 0 0 0 - 0  Change in appetite 0 0 0 - 0  Feeling bad or failure about yourself  0 0 0 - 0  Trouble concentrating 0 0 0 - 0  Moving slowly or fidgety/restless 0 0 0 - 0  Suicidal thoughts 0 0 0 - 0  PHQ-9 Score 0 0 0 - 0  Difficult doing work/chores Not difficult at all Not difficult at all Not difficult at all - Not difficult at all  Some recent data might be hidden    phq 9 is negative   Fall Risk: Fall Risk  10/09/2019 08/22/2019 08/16/2019 03/30/2019 02/27/2019   Falls in the past year? 0 0 0 0 0  Number falls in past yr: 0 0 0 0 -  Injury with Fall? 0 0 0 0 -  Follow up - - Falls prevention discussed - -      Assessment & Plan   1. Hives  - Ambulatory referral to Immunology - EPINEPHrine (EPIPEN 2-PAK) 0.3 mg/0.3 mL IJ SOAJ injection; Inject 0.3 mLs (0.3 mg total) into the muscle as needed for anaphylaxis.  Dispense: 2 each; Refill: 0 - famotidine (PEPCID) 40 MG tablet; Take 1 tablet (40 mg total) by mouth 2 (two) times daily.  Dispense: 60 tablet; Refill: 0 - Alpha-Gal Panel - Food Allergy Profile - COMPLETE METABOLIC PANEL WITH GFR - CBC with Differential/Platelet  2. Need for immunization against influenza  - Flu Vaccine QUAD High Dose(Fluad)  3. Essential hypertension  - COMPLETE METABOLIC PANEL WITH GFR - CBC with Differential/Platelet  4. Angioedema of lips, subsequent encounter  - Alpha-Gal Panel - Food Allergy Profile

## 2019-10-12 ENCOUNTER — Other Ambulatory Visit: Payer: Self-pay | Admitting: Family Medicine

## 2019-10-12 DIAGNOSIS — I10 Essential (primary) hypertension: Secondary | ICD-10-CM

## 2019-10-15 ENCOUNTER — Other Ambulatory Visit: Payer: Self-pay | Admitting: Family Medicine

## 2019-10-15 LAB — COMPLETE METABOLIC PANEL WITH GFR
AG Ratio: 1.3 (calc) (ref 1.0–2.5)
ALT: 11 U/L (ref 6–29)
AST: 16 U/L (ref 10–35)
Albumin: 3.8 g/dL (ref 3.6–5.1)
Alkaline phosphatase (APISO): 99 U/L (ref 37–153)
BUN: 11 mg/dL (ref 7–25)
CO2: 30 mmol/L (ref 20–32)
Calcium: 9.3 mg/dL (ref 8.6–10.4)
Chloride: 104 mmol/L (ref 98–110)
Creat: 0.82 mg/dL (ref 0.50–0.99)
GFR, Est African American: 85 mL/min/{1.73_m2} (ref >=60)
GFR, Est Non African American: 74 mL/min/{1.73_m2} (ref >=60)
Globulin: 3 g/dL (calc) (ref 1.9–3.7)
Glucose, Bld: 99 mg/dL (ref 65–99)
Potassium: 4.3 mmol/L (ref 3.5–5.3)
Sodium: 143 mmol/L (ref 135–146)
Total Bilirubin: 0.3 mg/dL (ref 0.2–1.2)
Total Protein: 6.8 g/dL (ref 6.1–8.1)

## 2019-10-15 LAB — FOOD ALLERGY PROFILE
Allergen, Salmon, f41: <0.1 kU/L
Almonds: <0.1 kU/L
CLASS: 0
CLASS: 0
CLASS: 0
CLASS: 0
CLASS: 0
CLASS: 0
CLASS: 0
CLASS: 0
CLASS: 0
CLASS: 0
CLASS: 2
Cashew IgE: <0.1 kU/L
Class: 0
Class: 2
Class: 2
Class: 2
Egg White IgE: 1.18 kU/L — ABNORMAL HIGH
Fish Cod: <0.1 kU/L
Hazelnut: <0.1 kU/L
Milk IgE: 0.89 kU/L — ABNORMAL HIGH
Peanut IgE: 0.14 kU/L — ABNORMAL HIGH
Scallop IgE: 0.11 kU/L — ABNORMAL HIGH
Sesame Seed f10: 0.11 kU/L — ABNORMAL HIGH
Shrimp IgE: 1.96 kU/L — ABNORMAL HIGH
Soybean IgE: <0.1 kU/L
Tuna IgE: <0.1 kU/L
Walnut: <0.1 kU/L
Wheat IgE: 1.04 kU/L — ABNORMAL HIGH

## 2019-10-15 LAB — CBC WITH DIFFERENTIAL/PLATELET
Absolute Monocytes: 390 cells/uL (ref 200–950)
Basophils Absolute: 52 cells/uL (ref 0–200)
Basophils Relative: 0.8 %
Eosinophils Absolute: 130 cells/uL (ref 15–500)
Eosinophils Relative: 2 %
HCT: 39.4 % (ref 35.0–45.0)
Hemoglobin: 12.7 g/dL (ref 11.7–15.5)
Lymphs Abs: 2360 cells/uL (ref 850–3900)
MCH: 27.1 pg (ref 27.0–33.0)
MCHC: 32.2 g/dL (ref 32.0–36.0)
MCV: 84.2 fL (ref 80.0–100.0)
MPV: 11.4 fL (ref 7.5–12.5)
Monocytes Relative: 6 %
Neutro Abs: 3569 cells/uL (ref 1500–7800)
Neutrophils Relative %: 54.9 %
Platelets: 291 10*3/uL (ref 140–400)
RBC: 4.68 10*6/uL (ref 3.80–5.10)
RDW: 13.2 % (ref 11.0–15.0)
Total Lymphocyte: 36.3 %
WBC: 6.5 10*3/uL (ref 3.8–10.8)

## 2019-10-15 LAB — ALPHA-GAL PANEL
Beef IgE: <0.1 kU/L (ref <0.35)
Class: 0
Class: 0
Galactose-alpha-1,3-galactose IgE: <0.1 kU/L (ref <0.10)
LAMB/MUTTON IGE: <0.1 kU/L (ref <0.35)
Pork IgE: 0.29 kU/L (ref <0.35)

## 2019-10-15 LAB — INTERPRETATION:

## 2019-10-17 ENCOUNTER — Other Ambulatory Visit: Payer: Self-pay | Admitting: Family Medicine

## 2019-10-17 DIAGNOSIS — J302 Other seasonal allergic rhinitis: Secondary | ICD-10-CM

## 2019-10-17 NOTE — Telephone Encounter (Signed)
Requested medication (s) are due for refill today: no  Requested medication (s) are on the active medication list: no  Last refill:  07/26/2019  Future visit scheduled: yes  Notes to clinic:  Medication was discontinued  Review for refill   Requested Prescriptions  Pending Prescriptions Disp Refills   loratadine (CLARITIN) 10 MG tablet [Pharmacy Med Name: LORATADINE 10 MG TABLET] 90 tablet 0    Sig: TAKE 1 TABLET BY MOUTH EVERY DAY     Ear, Nose, and Throat:  Antihistamines Passed - 10/17/2019  1:33 AM      Passed - Valid encounter within last 12 months    Recent Outpatient Visits          1 week ago [facility] Steele Sizer, MD   1 month ago Acute non-recurrent pansinusitis   [place], NP   6 months ago Cough   [facility] [place], Drue Stager, MD   8 months ago Dyslipidemia associated with type 2 diabetes mellitus [facility])   [facility] Steele Sizer, MD   10 months ago Acute non-recurrent maxillary sinusitis   [place], NP      Future Appointments            In 2 weeks Steele Sizer, MD [facility] [place]   In 10 months  [facility]           Signed Prescriptions Disp Refills   fluticasone (FLONASE) 50 MCG/ACT nasal spray 48 mL 0    Sig: SPRAY 2 SPRAYS INTO EACH NOSTRIL EVERY DAY     Ear, Nose, and Throat: Nasal Preparations - Corticosteroids Passed - 10/17/2019  1:33 AM      Passed - Valid encounter within last 12 months    Recent Outpatient Visits          1 week ago [facility] Steele Sizer, MD   1 month ago Acute non-recurrent pansinusitis   [place], NP   6 months ago Cough   [facility] [place], Drue Stager, MD   8 months ago Dyslipidemia associated with type 2 diabetes  mellitus [facility])   [facility] Steele Sizer, MD   10 months ago Acute non-recurrent maxillary sinusitis   [place], NP      Future Appointments            In 2 weeks Steele Sizer, MD [facility] [place]   In 10 months  [facility]

## 2019-10-31 ENCOUNTER — Other Ambulatory Visit: Payer: Self-pay | Admitting: Family Medicine

## 2019-10-31 DIAGNOSIS — L509 Urticaria, unspecified: Secondary | ICD-10-CM

## 2019-11-06 ENCOUNTER — Encounter: Payer: Self-pay | Admitting: Family Medicine

## 2019-11-06 ENCOUNTER — Other Ambulatory Visit: Payer: Self-pay

## 2019-11-06 ENCOUNTER — Ambulatory Visit (INDEPENDENT_AMBULATORY_CARE_PROVIDER_SITE_OTHER): Payer: PPO | Admitting: Family Medicine

## 2019-11-06 VITALS — BP 122/70 | HR 63 | Temp 97.1°F | Resp 16 | Ht 66.0 in | Wt 206.3 lb

## 2019-11-06 DIAGNOSIS — E2839 Other primary ovarian failure: Secondary | ICD-10-CM | POA: Diagnosis not present

## 2019-11-06 DIAGNOSIS — Z23 Encounter for immunization: Secondary | ICD-10-CM

## 2019-11-06 DIAGNOSIS — Z13820 Encounter for screening for osteoporosis: Secondary | ICD-10-CM

## 2019-11-06 DIAGNOSIS — Z0000 Encounter for general adult medical examination without abnormal findings: Secondary | ICD-10-CM

## 2019-11-06 NOTE — Progress Notes (Signed)
Name: Morgan Peterson   MRN: [id number]    DOB: [birth year]   Date:11/06/2019       Progress Note  Subjective  Chief Complaint  Chief Complaint  Patient presents with  . Annual Exam    HPI  Patient presents for annual CPE.  Diet: avoiding food that is causing her to have hives, going to see immunologist in Dec  Exercise: explained importance of 150 minutes per week   USPSTF grade A and B recommendations    Office Visit from 08/22/2019 in [facility]  AUDIT-C Score  0     Depression: Phq 9 is  negative Depression screen [facility] 2/9 11/06/2019 10/09/2019 08/22/2019 08/22/2019 08/16/2019  Decreased Interest 0 0 0 0 0  Down, Depressed, Hopeless 0 0 0 0 0  PHQ - 2 Score 0 0 0 0 0  Altered sleeping 0 0 0 0 -  Tired, decreased energy 0 0 0 0 -  Change in appetite 0 0 0 0 -  Feeling bad or failure about yourself  0 0 0 0 -  Trouble concentrating 0 0 0 0 -  Moving slowly or fidgety/restless 0 0 0 0 -  Suicidal thoughts 0 0 0 0 -  PHQ-9 Score 0 0 0 0 -  Difficult doing work/chores - Not difficult at all Not difficult at all Not difficult at all -  Some recent data might be hidden   Hypertension: BP Readings from Last 3 Encounters:  11/06/19 122/70  10/09/19 (!) 144/78  02/27/19 134/82   Obesity: Wt Readings from Last 3 Encounters:  11/06/19 206 lb 4.8 oz (93.6 kg)  10/09/19 199 lb (90.3 kg)  08/16/19 206 lb (93.4 kg)   BMI Readings from Last 3 Encounters:  11/06/19 33.30 kg/m  10/09/19 32.12 kg/m  08/16/19 33.25 kg/m     Hep C Screening: negative screen  STD testing and prevention (HIV/chl/gon/syphilis): N/A Intimate partner violence: negative screen  Sexual History/Pain during Intercourse: husband has ED, not sexually active  Menstrual History/LMP/Abnormal Bleeding: discussed atypical lesions  Incontinence Symptoms:   Breast cancer:  - Last Mammogram: up to date  - BRCA gene screening: not interested, she thinks she had it in the past with Dr.  Fleet Contras   Osteoporosis: discussed high calcium and vitamin D supplementation  Cervical cancer screening: not indicated at this time  Skin cancer: discussed atypical lesions  Colorectal cancer: repeat 2023  Lung cancer:  Low Dose CT Chest recommended if Age 44-80 years, 30 pack-year currently smoking OR have quit w/in 15years. Patient does not qualify.   ECG: 2018   Advanced Care Planning: A voluntary discussion about advance care planning including the explanation and discussion of advance directives.  Discussed health care proxy and Living will, and the patient was able to identify a health care proxy as husband .  Patient does not have a living will at present time. If patient does have living will, I have requested they bring this to the clinic to be scanned in to their chart.  Lipids: Lab Results  Component Value Date   CHOL 151 06/26/2019   CHOL 155 11/03/2018   CHOL 193 09/13/2017   Lab Results  Component Value Date   HDL 49 06/26/2019   HDL 52 11/03/2018   HDL 49 09/13/2017   Lab Results  Component Value Date   LDLCALC 87 06/26/2019   LDLCALC 86 11/03/2018   LDLCALC 130 (H) 09/13/2017   Lab Results  Component Value Date  TRIG 75 06/26/2019   TRIG 76 11/03/2018   TRIG 70 09/13/2017   Lab Results  Component Value Date   CHOLHDL 3.0 11/03/2018   CHOLHDL 3.1 11/04/2015   No results found for: LDLDIRECT  Glucose: Glucose  Date Value Ref Range Status  03/24/2015 162 (H) mg/dL Final    Comment:    65-99 NOTE: New Reference Range  02/25/15   09/12/2014 104 (H) 65 - 99 mg/dL Final  03/14/2014 146 (H) 65 - 99 mg/dL Final   Glucose, Bld  Date Value Ref Range Status  10/09/2019 99 65 - 99 mg/dL Final    Comment:    .            Fasting reference interval .   11/03/2018 71 65 - 139 mg/dL Final    Comment:    .        Non-fasting reference interval .   12/21/2017 88 65 - 99 mg/dL Final    Comment:    .            Fasting reference interval .     Glucose-Capillary  Date Value Ref Range Status  10/09/2015 353 (H) 65 - 99 mg/dL Final  10/01/2015 341 (H) 65 - 99 mg/dL Final  10/01/2015 347 (H) 65 - 99 mg/dL Final    Patient Active Problem List   Diagnosis Date Noted  . Chronic low back pain without sciatica 08/18/2017  . Irritable bowel syndrome with constipation 05/02/2017  . Vitamin D deficiency 09/09/2016  . Hiatal hernia 06/02/2016  . Allergic rhinitis, seasonal 06/02/2016  . Obesity (BMI 30-39.9) 04/30/2016  . Angio-edema 04/30/2016  . Hyperlipidemia 11/04/2015  . Hyperlipidemia due to type 2 diabetes mellitus ([place]) 11/04/2015  . Cervical radiculopathy at C6 07/31/2015  . Neuroma digital nerve 07/31/2015  . Type 2 diabetes mellitus with hyperglycemia, with long-term current use of insulin ([place]) 06/30/2015  . Right shoulder pain 06/30/2015  . Cervical disc disease 06/30/2015  . Essential hypertension 06/30/2015  . History of breast cancer in female 03/23/2009    Past Surgical History:  Procedure Laterality Date  . ABDOMINAL EXPLORATION SURGERY  2000  . BREAST BIOPSY Right 2010   +   . BREAST EXCISIONAL BIOPSY Right 2010   + mammo site inasive mammo ca  . BREAST LUMPECTOMY Right 2010   [place]  . COLONOSCOPY  07/19/2012   Normal exam, Dr. Bary Castilla  . TUBAL LIGATION  20 years ago    Family History  Problem Relation Age of Onset  . Hypertension Mother   . Hyperlipidemia Mother   . Diabetes Father   . Kidney disease Father   . Diabetes Brother   . Colon cancer Maternal Uncle   . Breast cancer Maternal Aunt     Social History   Socioeconomic History  . Marital status: Married    Spouse name: Antonio  . Number of children: 2  . Years of education: Not on file  . Highest education level: Not on file  Occupational History  . 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 Financial resource strain: Not hard at all  . Food insecurity    Worry: Never true    Inability: Never true  .  Transportation needs    Medical: No    Non-medical: No  Tobacco Use  . Smoking status: Former Smoker    Packs/day: 0.25    Years: 10.00    Pack years: 2.50    Types:  Cigarettes    Start date: 25    Quit date: 85    Years since quitting: 32.9  . Smokeless tobacco: Never Used  . Tobacco comment: smoking cessation materials not required  Substance and Sexual Activity  . Alcohol use: Yes    Alcohol/week: 0.0 standard drinks    Comment: wine  . Drug use: No  . Sexual activity: Not Currently    Comment: husband has ED  Lifestyle  . Physical activity    Days per week: 2 days    Minutes per session: 60 min  . Stress: Rather much  Relationships  . Social connections    Talks on phone: More than three times a week    Gets together: More than three times a week    Attends religious service: More than 4 times per year    Active member of club or organization: Yes    Attends meetings of clubs or organizations: More than 4 times per year    Relationship status: Married  . Intimate partner violence    Fear of current or ex partner: No    Emotionally abused: No    Physically abused: No    Forced sexual activity: No  Other Topics Concern  . Not on file  Social History Narrative  . Not on file     Current Outpatient Medications:  .  aspirin EC 81 MG tablet, Take 1 tablet (81 mg total) by mouth daily., Disp: 30 tablet, Rfl: 0 .  atenolol (TENORMIN) 25 MG tablet, TAKE 1 TABLET BY MOUTH EVERY DAY, Disp: 90 tablet, Rfl: 1 .  Blood Glucose Monitoring Suppl (ONETOUCH VERIO) w/Device KIT, 1 Device by Does not apply route once., Disp: 1 kit, Rfl: 0 .  Calcium Carb-Cholecalciferol 200-10 MG-MCG CAPS, Take by mouth., Disp: , Rfl:  .  cetirizine (ZYRTEC) 10 MG tablet, Take 10 mg by mouth daily., Disp: , Rfl:  .  cholecalciferol (VITAMIN D) 1000 UNITS tablet, Take 1,000 Units by mouth daily., Disp: , Rfl:  .  EPINEPHrine (EPIPEN 2-PAK) 0.3 mg/0.3 mL IJ SOAJ injection, Inject 0.3 mLs (0.3  mg total) into the muscle as needed for anaphylaxis., Disp: 2 each, Rfl: 0 .  famotidine (PEPCID) 40 MG tablet, TAKE 1 TABLET BY MOUTH TWICE A DAY, Disp: 60 tablet, Rfl: 0 .  fluticasone (FLONASE) 50 MCG/ACT nasal spray, SPRAY 2 SPRAYS INTO EACH NOSTRIL EVERY DAY, Disp: 48 mL, Rfl: 0 .  glucose blood (ONETOUCH VERIO) test strip, Use as instructed, Disp: 100 each, Rfl: 12 .  ibuprofen (ADVIL) 600 MG tablet, Take 1 tablet (600 mg total) by mouth every 8 (eight) hours as needed., Disp: 30 tablet, Rfl: 0 .  Lancets (ONETOUCH ULTRASOFT) lancets, Use as instructed, Disp: 100 each, Rfl: 3 .  montelukast (SINGULAIR) 10 MG tablet, TAKE 1 TABLET BY MOUTH EVERYDAY AT BEDTIME, Disp: 90 tablet, Rfl: 0 .  Multiple Vitamin (MULTIVITAMIN) LIQD, Take 5 mLs by mouth daily., Disp: , Rfl:  .  NON FORMULARY, CBD oil, patients takes a few drops each morning, Disp: , Rfl:  .  OZEMPIC, 0.25 OR 0.5 MG/DOSE, 2 MG/1.5ML SOPN, Inject 0.5 mg into the skin once a week., Disp: , Rfl:  .  rosuvastatin (CRESTOR) 5 MG tablet, TAKE 1 TABLET BY MOUTH EVERY DAY, Disp: 90 tablet, Rfl: 1 .  triamterene-hydrochlorothiazide (MAXZIDE-25) 37.5-25 MG tablet, TAKE 1 TABLET BY MOUTH EVERY DAY, Disp: 90 tablet, Rfl: 1 .  vitamin B-12 (CYANOCOBALAMIN) 1000 MCG tablet, Take 1 tablet by mouth  3 (three) times a week. , Disp: , Rfl:  .  ZINC-VITAMIN C PO, Take 1 tablet by mouth daily., Disp: , Rfl:   Allergies  Allergen Reactions  . Levaquin [Levofloxacin In D5w] Swelling    Other reaction(s): Arthralgia (Joint Pain)  . Losartan     Other reaction(s): Angioedema  . Amlodipine-Olmesartan   . Egg Donia Pounds, Egg]   . Gabapentin     cns side effects.  . Glipizide     Other reaction(s): Abdominal pain  . Gluten Meal   . Invokana [Canagliflozin] Other (See Comments)    Bladder pain  . Lisinopril Swelling    Angioedema  . Metformin And Related Nausea Only  . Milk-Related Compounds   . Onglyza [Saxagliptin] Other (See Comments)     Increased PVC's  . Shrimp [Shellfish Allergy]   . Tramadol Nausea And Vomiting  . Actos [Pioglitazone] Other (See Comments) and Nausea And Vomiting    Bladder pain  . Atorvastatin Other (See Comments)  . Carvedilol Other (See Comments)    NUMBNESS, TINGLING, ACHING IN ARMS  . Prednisone Palpitations     ROS  Constitutional: Negative for fever or weight change.  Respiratory: Negative for cough and shortness of breath.   Cardiovascular: Negative for chest pain or palpitations.  Gastrointestinal: Negative for abdominal pain, no bowel changes.  Musculoskeletal: Negative for gait problem or joint swelling.  Skin: Negative for rash. Intermittent hives  Neurological: Negative for dizziness or headache.  No other specific complaints in a complete review of systems (except as listed in HPI above).  Objective  Vitals:   11/06/19 1012  BP: 122/70  Pulse: 63  Resp: 16  Temp: (!) 97.1 F (36.2 C)  TempSrc: Temporal  SpO2: 97%  Weight: 206 lb 4.8 oz (93.6 kg)  Height: _0  (1.676 m)    Body mass index is 33.3 kg/m.  Physical Exam  Constitutional: Patient appears well-developed and well-nourished. No distress.  HENT: Head: Normocephalic and atraumatic. Ears: B TMs ok, no erythema or effusion; Nose: Nose normal. Mouth/Throat: Oropharynx is clear and moist. No oropharyngeal exudate.  Eyes: Conjunctivae and EOM are normal. Pupils are equal, round, and reactive to light. No scleral icterus.  Neck: Normal range of motion. Neck supple. No JVD present. No thyromegaly present.  Cardiovascular: Normal rate, regular rhythm and normal heart sounds.  No murmur heard. No BLE edema. Pulmonary/Chest: Effort normal and breath sounds normal. No respiratory distress. Abdominal: Soft. Bowel sounds are normal, no distension. There is no tenderness. no masses Breast: no lumps or masses, no nipple discharge or rashes FEMALE GENITALIA:  Not done RECTAL: not done Musculoskeletal: Normal range of  motion, no joint effusions. No gross deformities Neurological: he is alert and oriented to person, place, and time. No cranial nerve deficit. Coordination, balance, strength, speech and gait are normal.  Skin: Skin is warm and dry. No rash noted. No erythema.  Psychiatric: Patient has a normal mood and affect. behavior is normal. Judgment and thought content normal.  Recent Results (from the past 2160 hour(s))  Alpha-Gal Panel     Status: None   Collection Time: 10/09/19 12:00 AM  Result Value Ref Range   Galactose-alpha-1,3-galactose IgE <0.10 <0.10 kU/L    Comment: . Previous reports (JACI [id number]) have demonstrated that patients with IgE antibodies to galactose-a-1,3-galactose are at risk for delayed anaphylaxis, angioedema, or urticaria following consumption of beef, pork, or lamb.    Beef IgE <0.10 <0.35 kU/L   Class 0  LAMB/MUTTON IGE <0.10 <0.35 kU/L   Class 0    Pork IgE 0.29 <0.35 kU/L   Class 0/1     Comment: . The test method is the [employer] ImmunoCAP allergen-specific IgE system. CLASS INTERPRETATION   <0.10 kU/L= 0, Negative; 0.10 - 0.34 kU/L= 0/1, Equivocal/Borderline;  0.35 - 0.69  kU/L=1, Low Positive; 0.70 - 3.49 kU/L=2, Moderate Positive;  3.50  - 17.49 kU/L=3, High Positive; 17.50 - 49.99 kU/L= 4, Very High Positive; 50.00 - 99.99  kU/L= 5, Very High Positive;   >99.99 kU/L=6, Very High Positive . *This test was developed and its performance characteristics determined by [employer]. It has not been cleared or approved by the U.S. Food and Drug Administration.   Food Allergy Profile     Status: Abnormal   Collection Time: 10/09/19 12:00 AM  Result Value Ref Range   Egg White IgE 1.18 (H) kU/L   Class 2    Peanut IgE 0.14 (H) kU/L   Class 0    Wheat IgE 1.04 (H) kU/L   CLASS 2    Walnut <0.10 kU/L   CLASS 0    Fish Cod <0.10 kU/L   CLASS 0    Milk IgE 0.89 (H) kU/L   Class 2    Soybean IgE <0.10 kU/L   CLASS 0    Shrimp IgE 1.96 (H)  kU/L   Class 2    Scallop IgE 0.11 (H) kU/L   CLASS 0    Sesame Seed f10  0.11 (H) kU/L   CLASS 0    Hazelnut <0.10 kU/L   CLASS 0    Cashew IgE <0.10 kU/L   CLASS 0    Almonds <0.10 kU/L   CLASS 0    Allergen, Salmon, f41 <0.10 kU/L   CLASS 0    Tuna IgE <0.10 kU/L   CLASS 0   COMPLETE METABOLIC PANEL WITH GFR     Status: None   Collection Time: 10/09/19 12:00 AM  Result Value Ref Range   Glucose, Bld 99 65 - 99 mg/dL    Comment: .            Fasting reference interval .    BUN 11 7 - 25 mg/dL   Creat 0.82 0.50 - 0.99 mg/dL    Comment: For patients >74 years of age, the reference limit for Creatinine is approximately 13% higher for people identified as African-American. .    GFR, Est Non African American 74 > OR = 60 mL/min/1.72m   GFR, Est African American 85 > OR = 60 mL/min/1.728m  BUN/Creatinine Ratio NOT APPLICABLE 6 - 22 (calc)   Sodium 143 135 - 146 mmol/L   Potassium 4.3 3.5 - 5.3 mmol/L   Chloride 104 98 - 110 mmol/L   CO2 30 20 - 32 mmol/L   Calcium 9.3 8.6 - 10.4 mg/dL   Total Protein 6.8 6.1 - 8.1 g/dL   Albumin 3.8 3.6 - 5.1 g/dL   Globulin 3.0 1.9 - 3.7 g/dL (calc)   AG Ratio 1.3 1.0 - 2.5 (calc)   Total Bilirubin 0.3 0.2 - 1.2 mg/dL   Alkaline phosphatase (APISO) 99 37 - 153 U/L   AST 16 10 - 35 U/L   ALT 11 6 - 29 U/L  CBC with Differential/Platelet     Status: None   Collection Time: 10/09/19 12:00 AM  Result Value Ref Range   WBC 6.5 3.8 - 10.8 Thousand/uL   RBC 4.68 3.80 - 5.10 Million/uL  Hemoglobin 12.7 11.7 - 15.5 g/dL   HCT 39.4 35.0 - 45.0 %   MCV 84.2 80.0 - 100.0 fL   MCH 27.1 27.0 - 33.0 pg   MCHC 32.2 32.0 - 36.0 g/dL   RDW 13.2 11.0 - 15.0 %   Platelets 291 140 - 400 Thousand/uL   MPV 11.4 7.5 - 12.5 fL   Neutro Abs 3,569 1,500 - 7,800 cells/uL   Lymphs Abs 2,360 850 - 3,900 cells/uL   Absolute Monocytes 390 200 - 950 cells/uL   Eosinophils Absolute 130 15 - 500 cells/uL   Basophils Absolute 52 0 - 200 cells/uL    Neutrophils Relative % 54.9 %   Total Lymphocyte 36.3 %   Monocytes Relative 6.0 %   Eosinophils Relative 2.0 %   Basophils Relative 0.8 %  Interpretation:     Status: None   Collection Time: 10/09/19 12:00 AM  Result Value Ref Range   Interpretation      Comment: . Specific                        Level of Allergen IGE Class      kU/L             Specific IGE Antibody  -----         ---------        -------------------   0              <0.10           Absent/Undetectable   0/1        0.10-0.34           Very Low Level   1          0.35-0.69           Low Level   2          0.70-3.49           Moderate Level   3          3.50-17.4           High Level   4          17.5-49.9           Very High Level   5            50-100            Very High Level   6              >100            Very High Level . The clinical relevance of allergen results of 0.10-0.34 kU/L are undetermined and intended for  specialist use. . Allergens denoted with a "**" include results using one or more analyte specific reagents. In those cases, the test was developed and its analytical performance characteristics have been determined by [employer]. It has not been cleared or approved by the U.S. Food and Drug Administration. This assay  has been v alidated pursuant to the [employer]  and is used for clinical purposes.       Fall Risk: Fall Risk  11/06/2019 10/09/2019 08/22/2019 08/16/2019 03/30/2019  Falls in the past year? 0 0 0 0 0  Number falls in past yr: 0 0 0 0 0  Injury with Fall? 0 0 0 0 0  Follow up - - - Falls prevention discussed -     Functional Status Survey: Is  the patient deaf or have difficulty hearing?: No Does the patient have difficulty seeing, even when wearing glasses/contacts?: No Does the patient have difficulty concentrating, remembering, or making decisions?: No Does the patient have difficulty walking or climbing stairs?: No Does the patient have difficulty  dressing or bathing?: No Does the patient have difficulty doing errands alone such as visiting a doctor's office or shopping?: No   Assessment & Plan   1. Well adult exam  Discussed shingrix   2. Osteoporosis screening  Bone density   3. Ovarian failure  Bone density   4. Need for vaccination for pneumococcus  - Pneumococcal conjugate vaccine 13-valent IM  -USPSTF grade A and B recommendations reviewed with patient; age-appropriate recommendations, preventive care, screening tests, etc discussed and encouraged; healthy living encouraged; see AVS for patient education given to patient -Discussed importance of 150 minutes of physical activity weekly, eat two servings of fish weekly, eat one serving of tree nuts ( cashews, pistachios, pecans, almonds.Marland Kitchen) every other day, eat 6 servings of fruit/vegetables daily and drink plenty of water and avoid sweet beverages.

## 2019-11-06 NOTE — Patient Instructions (Signed)
Check coverage of shingrix vaccine  Preventive Care 65 Years and Older, Female Preventive care refers to lifestyle choices and visits with your health care provider that can promote health and wellness. This includes:  A yearly physical exam. This is also called an annual well check.  Regular dental and eye exams.  Immunizations.  Screening for certain conditions.  Healthy lifestyle choices, such as diet and exercise. What can I expect for my preventive care visit? Physical exam Your health care provider will check:  Height and weight. These may be used to calculate body mass index (BMI), which is a measurement that tells if you are at a healthy weight.  Heart rate and blood pressure.  Your skin for abnormal spots. Counseling Your health care provider may ask you questions about:  Alcohol, tobacco, and drug use.  Emotional well-being.  Home and relationship well-being.  Sexual activity.  Eating habits.  History of falls.  Memory and ability to understand (cognition).  Work and work Statistician.  Pregnancy and menstrual history. What immunizations do I need?  Influenza (flu) vaccine  This is recommended every year. Tetanus, diphtheria, and pertussis (Tdap) vaccine  You may need a Td booster every 10 years. Varicella (chickenpox) vaccine  You may need this vaccine if you have not already been vaccinated. Zoster (shingles) vaccine  You may need this after age 86. Pneumococcal conjugate (PCV13) vaccine  One dose is recommended after age 79. Pneumococcal polysaccharide (PPSV23) vaccine  One dose is recommended after age 41. Measles, mumps, and rubella (MMR) vaccine  You may need at least one dose of MMR if you were born in 1957 or later. You may also need a second dose. Meningococcal conjugate (MenACWY) vaccine  You may need this if you have certain conditions. Hepatitis A vaccine  You may need this if you have certain conditions or if you travel or  work in places where you may be exposed to hepatitis A. Hepatitis B vaccine  You may need this if you have certain conditions or if you travel or work in places where you may be exposed to hepatitis B. Haemophilus influenzae type b (Hib) vaccine  You may need this if you have certain conditions. You may receive vaccines as individual doses or as more than one vaccine together in one shot (combination vaccines). Talk with your health care provider about the risks and benefits of combination vaccines. What tests do I need? Blood tests  Lipid and cholesterol levels. These may be checked every 5 years, or more frequently depending on your overall health.  Hepatitis C test.  Hepatitis B test. Screening  Lung cancer screening. You may have this screening every year starting at age 69 if you have a 30-pack-year history of smoking and currently smoke or have quit within the past 15 years.  Colorectal cancer screening. All adults should have this screening starting at age 79 and continuing until age 35. Your health care provider may recommend screening at age 72 if you are at increased risk. You will have tests every 1-10 years, depending on your results and the type of screening test.  Diabetes screening. This is done by checking your blood sugar (glucose) after you have not eaten for a while (fasting). You may have this done every 1-3 years.  Mammogram. This may be done every 1-2 years. Talk with your health care provider about how often you should have regular mammograms.  BRCA-related cancer screening. This may be done if you have a family history of  breast, ovarian, tubal, or peritoneal cancers. Other tests  Sexually transmitted disease (STD) testing.  Bone density scan. This is done to screen for osteoporosis. You may have this done starting at age 24. Follow these instructions at home: Eating and drinking  Eat a diet that includes fresh fruits and vegetables, whole grains, lean  protein, and low-fat dairy products. Limit your intake of foods with high amounts of sugar, saturated fats, and salt.  Take vitamin and mineral supplements as recommended by your health care provider.  Do not drink alcohol if your health care provider tells you not to drink.  If you drink alcohol: ? Limit how much you have to 0-1 drink a day. ? Be aware of how much alcohol is in your drink. In the [place] one drink equals one 12 oz bottle of beer (355 mL), one 5 oz glass of wine (148 mL), or one 1 oz glass of hard liquor (44 mL). Lifestyle  Take daily care of your teeth and gums.  Stay active. Exercise for at least 30 minutes on 5 or more days each week.  Do not use any products that contain nicotine or tobacco, such as cigarettes, e-cigarettes, and chewing tobacco. If you need help quitting, ask your health care provider.  If you are sexually active, practice safe sex. Use a condom or other form of protection in order to prevent STIs (sexually transmitted infections).  Talk with your health care provider about taking a low-dose aspirin or statin. What's next?  Go to your health care provider once a year for a well check visit.  Ask your health care provider how often you should have your eyes and teeth checked.  Stay up to date on all vaccines. This information is not intended to replace advice given to you by your health care provider. Make sure you discuss any questions you have with your health care provider. Document Released: 01/02/2016 Document Revised: 11/30/2018 Document Reviewed: 11/30/2018 Elsevier Patient Education  2020 [employer].

## 2019-11-19 ENCOUNTER — Other Ambulatory Visit: Payer: Self-pay | Admitting: Family Medicine

## 2019-11-19 DIAGNOSIS — L509 Urticaria, unspecified: Secondary | ICD-10-CM

## 2019-11-20 DIAGNOSIS — J3089 Other allergic rhinitis: Secondary | ICD-10-CM | POA: Diagnosis not present

## 2019-11-20 DIAGNOSIS — L501 Idiopathic urticaria: Secondary | ICD-10-CM | POA: Diagnosis not present

## 2019-11-20 DIAGNOSIS — T781XXD Other adverse food reactions, not elsewhere classified, subsequent encounter: Secondary | ICD-10-CM | POA: Diagnosis not present

## 2019-11-20 DIAGNOSIS — T783XXD Angioneurotic edema, subsequent encounter: Secondary | ICD-10-CM | POA: Diagnosis not present

## 2019-12-05 ENCOUNTER — Other Ambulatory Visit: Payer: Self-pay | Admitting: General Surgery

## 2019-12-05 DIAGNOSIS — Z853 Personal history of malignant neoplasm of breast: Secondary | ICD-10-CM

## 2019-12-31 ENCOUNTER — Other Ambulatory Visit: Payer: Self-pay | Admitting: Family Medicine

## 2019-12-31 DIAGNOSIS — I10 Essential (primary) hypertension: Secondary | ICD-10-CM

## 2019-12-31 NOTE — Telephone Encounter (Signed)
Requested Prescriptions  Pending Prescriptions Disp Refills  . atenolol (TENORMIN) 25 MG tablet [Pharmacy Med Name: ATENOLOL 25 MG TABLET] 90 tablet 1    Sig: TAKE 1 TABLET BY MOUTH EVERY DAY     Cardiovascular:  Beta Blockers Passed - 12/31/2019 10:56 AM      Passed - Last BP in normal range    BP Readings from Last 1 Encounters:  11/06/19 122/70         Passed - Last Heart Rate in normal range    Pulse Readings from Last 1 Encounters:  11/06/19 63         Passed - Valid encounter within last 6 months    Recent Outpatient Visits          1 month ago Well adult exam   [facility] Steele Sizer, MD   2 months ago [facility] Steele Sizer, MD   4 months ago Acute non-recurrent pansinusitis   [place], NP   9 months ago Cough   [facility] Steele Sizer, MD   11 months ago Dyslipidemia associated with type 2 diabetes mellitus [facility])   [facility] Steele Sizer, MD      Future Appointments            In 2 months Ancil Boozer, Drue Stager, MD [facility], [place]   In 7 months  [place]

## 2020-01-02 LAB — HEMOGLOBIN A1C: Hemoglobin A1C: 6.8

## 2020-01-23 ENCOUNTER — Telehealth: Payer: Self-pay

## 2020-01-23 NOTE — Telephone Encounter (Signed)
Copied from [place] [id number]. Topic: General - Other >> Jan 22, 2020  4:32 PM Leward Quan A wrote: Reason for CRM:  Patient called to inquire from Dr Ancil Boozer in regards to all her allergies and the Covid vaccine because she would like to take it. She want to know if Dr recommend her to do so  Ph# [phone]

## 2020-01-24 NOTE — Telephone Encounter (Signed)
Patient notified ok Dr. Ancil Boozer recommendations: "Food allergies and previous history of vaccine allergies increases her chance of an allergic reaction. It does not mean she cannot get the vaccine, but should get it at a facility that she can be monitored for 30 minutes. The [facility] or health department would be good options."   Patient states she will be going to her Pharmacy where there is a Nurse on stand by at all times.

## 2020-02-20 ENCOUNTER — Ambulatory Visit
Admission: RE | Admit: 2020-02-20 | Discharge: 2020-02-20 | Disposition: A | Discharge status: Home / Self Care | Payer: Medicare Other | Source: Ambulatory Visit | Attending: General Surgery | Admitting: General Surgery

## 2020-02-20 DIAGNOSIS — Z853 Personal history of malignant neoplasm of breast: Secondary | ICD-10-CM | POA: Diagnosis not present

## 2020-02-20 DIAGNOSIS — Z1231 Encounter for screening mammogram for malignant neoplasm of breast: Secondary | ICD-10-CM | POA: Diagnosis not present

## 2020-03-05 ENCOUNTER — Ambulatory Visit: Payer: PPO | Admitting: Family Medicine

## 2020-03-18 ENCOUNTER — Encounter: Payer: Self-pay | Admitting: Family Medicine

## 2020-03-18 ENCOUNTER — Other Ambulatory Visit: Payer: Self-pay

## 2020-03-18 ENCOUNTER — Ambulatory Visit (INDEPENDENT_AMBULATORY_CARE_PROVIDER_SITE_OTHER): Payer: Medicare Other | Admitting: Family Medicine

## 2020-03-18 VITALS — BP 128/70 | HR 88 | Temp 97.1°F | Resp 16 | Ht 66.0 in | Wt 205.8 lb

## 2020-03-18 DIAGNOSIS — E785 Hyperlipidemia, unspecified: Secondary | ICD-10-CM

## 2020-03-18 DIAGNOSIS — E1169 Type 2 diabetes mellitus with other specified complication: Secondary | ICD-10-CM | POA: Diagnosis not present

## 2020-03-18 DIAGNOSIS — T783XXD Angioneurotic edema, subsequent encounter: Secondary | ICD-10-CM | POA: Diagnosis not present

## 2020-03-18 DIAGNOSIS — I10 Essential (primary) hypertension: Secondary | ICD-10-CM

## 2020-03-18 DIAGNOSIS — J302 Other seasonal allergic rhinitis: Secondary | ICD-10-CM

## 2020-03-18 DIAGNOSIS — L509 Urticaria, unspecified: Secondary | ICD-10-CM

## 2020-03-18 DIAGNOSIS — M545 Low back pain, unspecified: Secondary | ICD-10-CM

## 2020-03-18 DIAGNOSIS — E559 Vitamin D deficiency, unspecified: Secondary | ICD-10-CM

## 2020-03-18 DIAGNOSIS — Z91018 Allergy to other foods: Secondary | ICD-10-CM

## 2020-03-18 LAB — POCT UA - MICROALBUMIN: Microalbumin Ur, POC: NEGATIVE mg/L

## 2020-03-18 MED ORDER — ROSUVASTATIN CALCIUM 5 MG PO TABS: 5.0000 mg | ORAL_TABLET | Freq: Every day | ORAL | 1 refills | Status: DC

## 2020-03-18 MED ORDER — FAMOTIDINE 40 MG PO TABS: 40.0000 mg | ORAL_TABLET | Freq: Two times a day (BID) | ORAL | 1 refills | Status: DC

## 2020-03-18 MED ORDER — TRIAMTERENE-HCTZ 37.5-25 MG PO TABS: 1.0000 | ORAL_TABLET | Freq: Every day | ORAL | 1 refills | Status: DC

## 2020-03-18 MED ORDER — MONTELUKAST SODIUM 10 MG PO TABS: 10.0000 mg | ORAL_TABLET | Freq: Every day | ORAL | 1 refills | Status: DC

## 2020-03-18 NOTE — Progress Notes (Signed)
Name: Morgan Peterson   MRN: [id number]    DOB: 1951-06-26   Date:03/18/2020       Progress Note  Subjective  Chief Complaint  Chief Complaint  Patient presents with  . GAD  . Medication Refill  . Diabetes    she is seeing  Endocrinologist Dr. Honor Junes now.   . Hyperlipidemia  . IBS constipation type    HPI  Hives: back in Oct 2020 she developed hives after eating , it happened two more times and she came in to see me. She had a lab test that showed a lot of food allergies, she saw allergies and patch test was negative. She has been avoiding gluten, milk , egg white, peanut , shrimp, scallop, sesame seed   HTN: bp is at goal ,  she has been taking medication as prescribedAtenolol and Triamterene HCTZ,denies chest pain but she has palpitation when tired no side effects. Allergic to ACE - angioedema.   DM:she is seeing  Endocrinologist Dr. Honor Junes now. She used to see  Dr. Lytle Michaels hgbA1C 13% April 2017 she wasShe was on Victoza and Lantus, but now on Ozempic only and is doing well.  Last hgbA1C was 6.8% back in January 2021 , she is due for urine micro . No polyphagia, no polydipsia or polyuria. She has been taking Crestor and tolerating it well now.  Fsbs at home has been well controlled low around low 100's  Migraine headaches: she is having occasional symptoms now, goes to a dark room when needed, also tried CBD oil and it helps. Unchanged   GAD: she is not on SSRI,she has bee off BZD , no recent problems. She makes a list of things to do and when stressed, listens to music and takes time off to rest   IBS constipation type: she has a long history of IBS constipation.She states her bowel movements are a couple of times a week. She states controlled with good water intake  Intermittent low back pain:she goes to [facility] and it helps, sometimes radiates down left leg     Patient Active Problem List   Diagnosis Date Noted  . Chronic low back pain  without sciatica 08/18/2017  . Irritable bowel syndrome with constipation 05/02/2017  . Vitamin D deficiency 09/09/2016  . Hiatal hernia 06/02/2016  . Allergic rhinitis, seasonal 06/02/2016  . Obesity (BMI 30-39.9) 04/30/2016  . Angio-edema 04/30/2016  . Hyperlipidemia 11/04/2015  . Hyperlipidemia due to type 2 diabetes mellitus ([place]) 11/04/2015  . Cervical radiculopathy at C6 07/31/2015  . Neuroma digital nerve 07/31/2015  . Type 2 diabetes mellitus with hyperglycemia, with long-term current use of insulin ([place]) 06/30/2015  . Right shoulder pain 06/30/2015  . Cervical disc disease 06/30/2015  . Essential hypertension 06/30/2015  . History of breast cancer in female 03/23/2009    Past Surgical History:  Procedure Laterality Date  . ABDOMINAL EXPLORATION SURGERY  2000  . BREAST BIOPSY Right 2010   +   . BREAST EXCISIONAL BIOPSY Right 2010   + mammo site inasive mammo ca  . BREAST LUMPECTOMY Right 2010   [place]  . COLONOSCOPY  07/19/2012   Normal exam, Dr. Bary Castilla  . TUBAL LIGATION  20 years ago    Family History  Problem Relation Age of Onset  . Hypertension Mother   . Hyperlipidemia Mother   . Diabetes Father   . Kidney disease Father   . Diabetes Brother   . Colon cancer Maternal Uncle   . Breast  cancer Maternal Aunt     Social History   Tobacco Use  . Smoking status: Former Smoker    Packs/day: 0.25    Years: 10.00    Pack years: 2.50    Types: Cigarettes    Start date: 69    Quit date: 1988    Years since quitting: 33.2  . Smokeless tobacco: Never Used  . Tobacco comment: smoking cessation materials not required  Substance Use Topics  . Alcohol use: Yes    Alcohol/week: 0.0 standard drinks    Comment: wine     Current Outpatient Medications:  .  aspirin EC 81 MG tablet, Take 1 tablet (81 mg total) by mouth daily., Disp: 30 tablet, Rfl: 0 .  atenolol (TENORMIN) 25 MG tablet, TAKE 1 TABLET BY MOUTH EVERY DAY, Disp: 90 tablet, Rfl: 1 .  Blood Glucose  Monitoring Suppl (ONETOUCH VERIO) w/Device KIT, 1 Device by Does not apply route once., Disp: 1 kit, Rfl: 0 .  Calcium Carb-Cholecalciferol 200-10 MG-MCG CAPS, Take by mouth., Disp: , Rfl:  .  cetirizine (ZYRTEC) 10 MG tablet, Take 10 mg by mouth daily., Disp: , Rfl:  .  cholecalciferol (VITAMIN D) 1000 UNITS tablet, Take 1,000 Units by mouth daily., Disp: , Rfl:  .  EPINEPHrine (EPIPEN 2-PAK) 0.3 mg/0.3 mL IJ SOAJ injection, Inject 0.3 mLs (0.3 mg total) into the muscle as needed for anaphylaxis., Disp: 2 each, Rfl: 0 .  famotidine (PEPCID) 40 MG tablet, TAKE 1 TABLET BY MOUTH TWICE A DAY, Disp: 180 tablet, Rfl: 1 .  fluticasone (FLONASE) 50 MCG/ACT nasal spray, SPRAY 2 SPRAYS INTO EACH NOSTRIL EVERY DAY, Disp: 48 mL, Rfl: 0 .  glucose blood (ONETOUCH VERIO) test strip, Use as instructed, Disp: 100 each, Rfl: 12 .  ibuprofen (ADVIL) 600 MG tablet, Take 1 tablet (600 mg total) by mouth every 8 (eight) hours as needed., Disp: 30 tablet, Rfl: 0 .  Lancets (ONETOUCH ULTRASOFT) lancets, Use as instructed, Disp: 100 each, Rfl: 3 .  montelukast (SINGULAIR) 10 MG tablet, TAKE 1 TABLET BY MOUTH EVERYDAY AT BEDTIME, Disp: 90 tablet, Rfl: 0 .  Multiple Vitamin (MULTIVITAMIN) LIQD, Take 5 mLs by mouth daily., Disp: , Rfl:  .  NON FORMULARY, CBD oil, patients takes a few drops each morning, Disp: , Rfl:  .  OZEMPIC, 0.25 OR 0.5 MG/DOSE, 2 MG/1.5ML SOPN, Inject 0.5 mg into the skin once a week., Disp: , Rfl:  .  rosuvastatin (CRESTOR) 5 MG tablet, TAKE 1 TABLET BY MOUTH EVERY DAY, Disp: 90 tablet, Rfl: 1 .  triamterene-hydrochlorothiazide (MAXZIDE-25) 37.5-25 MG tablet, TAKE 1 TABLET BY MOUTH EVERY DAY, Disp: 90 tablet, Rfl: 1 .  vitamin B-12 (CYANOCOBALAMIN) 1000 MCG tablet, Take 1 tablet by mouth 3 (three) times a week. , Disp: , Rfl:  .  ZINC-VITAMIN C PO, Take 1 tablet by mouth daily., Disp: , Rfl:   Allergies  Allergen Reactions  . Levaquin [Levofloxacin In D5w] Swelling    Other reaction(s):  Arthralgia (Joint Pain)  . Losartan     Other reaction(s): Angioedema  . Amlodipine-Olmesartan   . Egg Donia Pounds, Egg]   . Gabapentin     cns side effects.  . Glipizide     Other reaction(s): Abdominal pain  . Gluten Meal   . Invokana [Canagliflozin] Other (See Comments)    Bladder pain  . Lisinopril Swelling    Angioedema  . Metformin And Related Nausea Only  . Milk-Related Compounds   . Onglyza [Saxagliptin] Other (See  Comments)    Increased PVC's  . Shrimp [Shellfish Allergy]   . Tramadol Nausea And Vomiting  . Actos [Pioglitazone] Other (See Comments) and Nausea And Vomiting    Bladder pain  . Atorvastatin Other (See Comments)  . Carvedilol Other (See Comments)    NUMBNESS, TINGLING, ACHING IN ARMS  . Prednisone Palpitations    I personally reviewed active problem list, medication list, allergies, family history, social history, health maintenance with the patient/caregiver today.   ROS  Constitutional: Negative for fever or weight change.  Respiratory: Negative for cough and shortness of breath.   Cardiovascular: Negative for chest pain or palpitations.  Gastrointestinal: Negative for abdominal pain, no bowel changes.  Musculoskeletal: Negative for gait problem or joint swelling.  Skin: Negative for rash.  Neurological: Negative for dizziness or headache.  No other specific complaints in a complete review of systems (except as listed in HPI above).  Objective  Vitals:   03/18/20 1539  BP: 128/70  Pulse: 88  Resp: 16  Temp: (!) 97.1 F (36.2 C)  TempSrc: Temporal  SpO2: 100%  Weight: 205 lb 12.8 oz (93.4 kg)  Height: '5\' 6"'  (1.676 m)    Body mass index is 33.22 kg/m.  Physical Exam  Constitutional: Patient appears well-developed and well-nourished. Obese  No distress.  HEENT: head atraumatic, normocephalic, pupils equal and reactive to light Cardiovascular: Normal rate, regular rhythm and normal heart sounds.  No murmur heard. No BLE  edema. Pulmonary/Chest: Effort normal and breath sounds normal. No respiratory distress. Abdominal: Soft.  There is no tenderness. Psychiatric: Patient has a normal mood and affect. behavior is normal. Judgment and thought content normal.  Recent Results (from the past 2160 hour(s))  Hemoglobin A1c     Status: None   Collection Time: 01/02/20 12:00 AM  Result Value Ref Range   Hemoglobin A1C 6.8     PHQ2/9: Depression screen [facility] 2/9 03/18/2020 11/06/2019 10/09/2019 08/22/2019 08/22/2019  Decreased Interest 0 0 0 0 0  Down, Depressed, Hopeless 0 0 0 0 0  PHQ - 2 Score 0 0 0 0 0  Altered sleeping 0 0 0 0 0  Tired, decreased energy 2 0 0 0 0  Change in appetite 0 0 0 0 0  Feeling bad or failure about yourself  0 0 0 0 0  Trouble concentrating 0 0 0 0 0  Moving slowly or fidgety/restless 0 0 0 0 0  Suicidal thoughts 0 0 0 0 0  PHQ-9 Score 2 0 0 0 0  Difficult doing work/chores Not difficult at all - Not difficult at all Not difficult at all Not difficult at all  Some recent data might be hidden    phq 9 is negative   Fall Risk: Fall Risk  03/18/2020 11/06/2019 10/09/2019 08/22/2019 08/16/2019  Falls in the past year? 0 0 0 0 0  Number falls in past yr: 0 0 0 0 0  Injury with Fall? 0 0 0 0 0  Follow up - - - - Falls prevention discussed    Functional Status Survey: Is the patient deaf or have difficulty hearing?: No Does the patient have difficulty seeing, even when wearing glasses/contacts?: No Does the patient have difficulty concentrating, remembering, or making decisions?: No Does the patient have difficulty walking or climbing stairs?: No Does the patient have difficulty dressing or bathing?: No Does the patient have difficulty doing errands alone such as visiting a doctor's office or shopping?: No   Assessment & Plan  1.  Essential hypertension  - triamterene-hydrochlorothiazide (MAXZIDE-25) 37.5-25 MG tablet; Take 1 tablet by mouth daily.  Dispense: 90 tablet; Refill:  1  2. Angioedema of lips, subsequent encounter   3. Dyslipidemia associated with type 2 diabetes mellitus (HCC)  - rosuvastatin (CRESTOR) 5 MG tablet; Take 1 tablet (5 mg total) by mouth daily.  Dispense: 90 tablet; Refill: 1  4. Intermittent low back pain   5. Vitamin D deficiency  Continue supplementation   6. Food allergy   7. Hives  - famotidine (PEPCID) 40 MG tablet; Take 1 tablet (40 mg total) by mouth 2 (two) times daily.  Dispense: 180 tablet; Refill: 1  8. Seasonal allergic rhinitis, unspecified trigger  - montelukast (SINGULAIR) 10 MG tablet; Take 1 tablet (10 mg total) by mouth at bedtime.  Dispense: 90 tablet; Refill: 1.

## 2020-03-18 NOTE — Addendum Note (Signed)
Addended by: Inda Coke on: 03/18/2020 04:48 PM   Modules accepted: Orders

## 2020-03-24 ENCOUNTER — Encounter: Payer: Self-pay | Admitting: Family Medicine

## 2020-03-28 ENCOUNTER — Ambulatory Visit: Payer: Medicare Other

## 2020-03-31 ENCOUNTER — Other Ambulatory Visit: Payer: Self-pay

## 2020-03-31 ENCOUNTER — Ambulatory Visit: Payer: Medicare Other | Attending: Internal Medicine

## 2020-03-31 DIAGNOSIS — Z23 Encounter for immunization: Secondary | ICD-10-CM

## 2020-03-31 NOTE — Progress Notes (Signed)
   Covid-19 Vaccination Clinic  Name:  Morgan Peterson    MRN: [id number] DOB: 08/30/1951  03/31/2020  [name] was observed post Covid-19 immunization for 30 minutes based on pre-vaccination screening without incident. She was provided with Vaccine Information Sheet and instruction to access the V-Safe system.   [name] was instructed to call 911 with any severe reactions post vaccine: Marland Kitchen Difficulty breathing  . Swelling of face and throat  . A fast heartbeat  . A bad rash all over body  . Dizziness and weakness   Immunizations Administered    Name Date Dose VIS Date Route   Pfizer COVID-19 Vaccine 03/31/2020 10:55 AM 0.3 mL 11/30/2019 Intramuscular   Manufacturer: [place]   Lot: [id number]   [place]: [id number]

## 2020-04-23 ENCOUNTER — Ambulatory Visit: Payer: Medicare Other | Attending: Internal Medicine

## 2020-04-23 ENCOUNTER — Ambulatory Visit: Payer: Medicare Other

## 2020-04-23 DIAGNOSIS — Z23 Encounter for immunization: Secondary | ICD-10-CM

## 2020-04-23 NOTE — Progress Notes (Signed)
   Covid-19 Vaccination Clinic  Name:  Morgan Peterson    MRN: [id number] DOB: [birth year]  04/23/2020  Morgan Peterson was observed post Covid-19 immunization for 30 minutes based on pre-vaccination screening without incident. She was provided with Vaccine Information Sheet and instruction to access the V-Safe system.   Morgan Peterson was instructed to call 911 with any severe reactions post vaccine: Marland Kitchen Difficulty breathing  . Swelling of face and throat  . A fast heartbeat  . A bad rash all over body  . Dizziness and weakness   Immunizations Administered    Name Date Dose VIS Date Route   Pfizer COVID-19 Vaccine 04/23/2020 11:56 AM 0.3 mL 02/13/2019 Intramuscular   Manufacturer: [place]   Lot: [id number]   [place]: [id number]

## 2020-06-24 ENCOUNTER — Telehealth: Payer: Self-pay | Admitting: *Deleted

## 2020-06-24 NOTE — Telephone Encounter (Signed)
This patient has a history of breast cancer and was treated by Dr Oliva Bustard in past, she is no longer followed for her cancer, but she has a question. She wants to take a vitamin for her hair that contains a DTH blocker ("a form of testosterone") and is asking if this would be safe to use. Please advise

## 2020-06-24 NOTE — Telephone Encounter (Signed)
I honestly have no idea.

## 2020-06-25 NOTE — Telephone Encounter (Signed)
Post menopausal patients with breast cancer who use testosterone may have an increased risk of breast cancer

## 2020-06-25 NOTE — Telephone Encounter (Signed)
Call returned to patient, She is states that the component is a DHT Blocker and will look at ingredients again and make sure there are no hormones in product as well as discuss with cosmatologist

## 2020-08-11 DIAGNOSIS — E041 Nontoxic single thyroid nodule: Secondary | ICD-10-CM | POA: Diagnosis not present

## 2020-08-19 ENCOUNTER — Ambulatory Visit: Payer: Medicare Other

## 2020-08-19 DIAGNOSIS — E559 Vitamin D deficiency, unspecified: Secondary | ICD-10-CM | POA: Diagnosis not present

## 2020-08-19 DIAGNOSIS — E1159 Type 2 diabetes mellitus with other circulatory complications: Secondary | ICD-10-CM | POA: Diagnosis not present

## 2020-08-19 DIAGNOSIS — E1169 Type 2 diabetes mellitus with other specified complication: Secondary | ICD-10-CM | POA: Diagnosis not present

## 2020-08-19 DIAGNOSIS — Z794 Long term (current) use of insulin: Secondary | ICD-10-CM | POA: Diagnosis not present

## 2020-08-19 DIAGNOSIS — E785 Hyperlipidemia, unspecified: Secondary | ICD-10-CM | POA: Diagnosis not present

## 2020-08-19 LAB — LIPID PANEL
Cholesterol: 141 (ref 0–200)
HDL: 48 (ref 35–70)
Triglycerides: 80 (ref 40–160)

## 2020-08-19 LAB — MICROALBUMIN, URINE: Microalb, Ur: NEGATIVE

## 2020-08-19 LAB — VITAMIN D 25 HYDROXY (VIT D DEFICIENCY, FRACTURES): Vit D, 25-Hydroxy: 50

## 2020-08-19 LAB — TSH: TSH: 1.44 (ref <=5.90)

## 2020-08-19 LAB — HEMOGLOBIN A1C: Hemoglobin A1C: 6.7

## 2020-08-22 DIAGNOSIS — I10 Essential (primary) hypertension: Secondary | ICD-10-CM | POA: Diagnosis not present

## 2020-08-22 DIAGNOSIS — I341 Nonrheumatic mitral (valve) prolapse: Secondary | ICD-10-CM | POA: Diagnosis not present

## 2020-08-22 DIAGNOSIS — E119 Type 2 diabetes mellitus without complications: Secondary | ICD-10-CM | POA: Diagnosis not present

## 2020-08-22 DIAGNOSIS — E782 Mixed hyperlipidemia: Secondary | ICD-10-CM | POA: Diagnosis not present

## 2020-08-22 DIAGNOSIS — I499 Cardiac arrhythmia, unspecified: Secondary | ICD-10-CM | POA: Diagnosis not present

## 2020-09-11 ENCOUNTER — Other Ambulatory Visit: Payer: Self-pay | Admitting: Family Medicine

## 2020-09-11 DIAGNOSIS — E785 Hyperlipidemia, unspecified: Secondary | ICD-10-CM

## 2020-09-11 NOTE — Telephone Encounter (Signed)
Requested Prescriptions  Pending Prescriptions Disp Refills   rosuvastatin (CRESTOR) 5 MG tablet [Pharmacy Med Name: ROSUVASTATIN CALCIUM 5 MG TAB] 90 tablet 1    Sig: TAKE 1 TABLET BY MOUTH EVERY DAY     Cardiovascular:  Antilipid - Statins Failed - 09/11/2020  8:16 AM      Failed - Total Cholesterol in normal range and within 360 days    Cholesterol, Total  Date Value Ref Range Status  09/13/2017 193 100 - 199 mg/dL Final   Cholesterol  Date Value Ref Range Status  06/26/2019 151 0 - 200 Final         Failed - LDL in normal range and within 360 days    LDL Cholesterol (Calc)  Date Value Ref Range Status  11/03/2018 86 mg/dL (calc) Final    Comment:    Reference range: <100 . Desirable range <100 mg/dL for primary prevention;   <70 mg/dL for patients with CHD or diabetic patients  with > or = 2 CHD risk factors. Marland Kitchen LDL-C is now calculated using the Martin-Hopkins  calculation, which is a validated novel method providing  better accuracy than the Friedewald equation in the  estimation of LDL-C.  Cresenciano Genre et al. Annamaria Helling. 8250;539(76): 2061-2068  (http://education.[facility].com/faq/FAQ164)    LDL Cholesterol  Date Value Ref Range Status  06/26/2019 87  Final         Failed - HDL in normal range and within 360 days    HDL  Date Value Ref Range Status  06/26/2019 49 35 - 70 Final  09/13/2017 49 >39 mg/dL Final         Failed - Triglycerides in normal range and within 360 days    Triglycerides  Date Value Ref Range Status  06/26/2019 75 40 - 160 Final         Passed - Patient is not pregnant      Passed - Valid encounter within last 12 months    Recent Outpatient Visits          5 months ago Essential hypertension   [facility] Steele Sizer, MD   10 months ago Well adult exam   [facility] Steele Sizer, MD   11 months ago [facility] Steele Sizer, MD   1 year ago Acute  non-recurrent pansinusitis   [facility] Fredderick Severance, NP   1 year ago Cough   [facility] Steele Sizer, MD      Future Appointments            In 1 week Steele Sizer, MD [facility], [facility]

## 2020-09-17 NOTE — Progress Notes (Signed)
Name: Morgan Peterson   MRN: [id number]    DOB: July 18, 1951   Date:09/18/2020       Progress Note  Subjective  Chief Complaint  Chief Complaint  Patient presents with  . Follow-up  . Hypertension  . Cough  . Chills  . Sore Throat    HPI  URI: symptoms started with nasal congestion a few weeks ago, symptoms are getting worse, last week she noticed post-nasal drainage, a dry cough, she has also noticed some lower rib pain with coughing, over the past few days she has noticed chills, checked temperature once and it was 99 that improved with extra-strength Tylenol. She has also tried Robitussin. She has some sob when talking too much but resolves when she drinks something warm. Normal appetite, no nausea or vomiting, no diarrhea. No lack of sense or smell or taste. 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 She had a lab test that showed a lot of food allergies, she saw allergies and patch test was negative. She has been avoiding gluten, milk , egg white, peanut , shrimp, scallop, sesame seed . Doing well   HTN: bp is at goal ,  she has been taking medication as prescribedAtenolol and Triamterene HCTZ,denies chest pain but she has palpitation when tired no side effects. Allergic to ACE - angioedema.Unchanged   QX:IHWTU seeingEndocrinologist Dr. Honor Junes now. She used to seeDr. Abbisogunbecause hgbA1C 13% April 2017 she wasShewas onVictoza and Lantus, but now on Ozempic only and is doing well.Last hgbA1C was 6.7 ( august 2021 ), urine micro also negative . No polyphagia, no polydipsia or polyuria. Shehas been taking Crestor and tolerating it well now, last LDL 77 - she is willing to take a higher dose   Migraine headaches:she states no recent episodes   IBS constipation  type: she has a long history of IBS constipation.She states her bowel movements are a couple of times a week. Doing well     Patient Active Problem List   Diagnosis Date Noted  . Chronic low back pain without sciatica 08/18/2017  . Irritable bowel syndrome with constipation 05/02/2017  . Vitamin D deficiency 09/09/2016  . Hiatal hernia 06/02/2016  . Allergic rhinitis, seasonal 06/02/2016  . Obesity (BMI 30-39.9) 04/30/2016  . Angio-edema 04/30/2016  . Hyperlipidemia 11/04/2015  . Hyperlipidemia due to type 2 diabetes mellitus ([place]) 11/04/2015  . Cervical radiculopathy at C6 07/31/2015  . Neuroma digital nerve 07/31/2015  . Type 2 diabetes mellitus with hyperglycemia, with long-term current use of insulin ([place]) 06/30/2015  . Right shoulder pain 06/30/2015  . Cervical disc disease 06/30/2015  . Essential hypertension 06/30/2015  . History of breast cancer in female 03/23/2009    Past Surgical History:  Procedure Laterality Date  . ABDOMINAL EXPLORATION SURGERY  2000  . BREAST BIOPSY Right 2010   +   . BREAST EXCISIONAL BIOPSY Right 2010   + mammo site inasive mammo ca  . BREAST LUMPECTOMY Right 2010   [place]  . COLONOSCOPY  07/19/2012   Normal exam, Dr. Bary Castilla  . TUBAL LIGATION  20 years ago    Family History  Problem Relation Age of Onset  . Hypertension Mother   . Hyperlipidemia Mother   . Diabetes Father   . Kidney disease Father   .  Diabetes Brother   . Colon cancer Maternal Uncle   . Breast cancer Maternal Aunt     Social History   Tobacco Use  . Smoking status: Former Smoker    Packs/day: 0.25    Years: 10.00    Pack years: 2.50    Types: Cigarettes    Start date: 69    Quit date: 1988    Years since quitting: 33.7  . Smokeless tobacco: Never Used  . Tobacco comment: smoking cessation materials not required  Substance Use Topics  . Alcohol use: Yes    Alcohol/week: 0.0 standard drinks    Comment: wine     Current Outpatient Medications:  .   aspirin EC 81 MG tablet, Take 1 tablet (81 mg total) by mouth daily., Disp: 30 tablet, Rfl: 0 .  atenolol (TENORMIN) 25 MG tablet, Take 1 tablet (25 mg total) by mouth daily., Disp: 90 tablet, Rfl: 1 .  Blood Glucose Monitoring Suppl (ONETOUCH VERIO) w/Device KIT, 1 Device by Does not apply route once., Disp: 1 kit, Rfl: 0 .  Calcium Carb-Cholecalciferol 200-10 MG-MCG CAPS, Take by mouth., Disp: , Rfl:  .  cholecalciferol (VITAMIN D) 1000 UNITS tablet, Take 1,000 Units by mouth daily., Disp: , Rfl:  .  EPINEPHrine (EPIPEN 2-PAK) 0.3 mg/0.3 mL IJ SOAJ injection, Inject 0.3 mLs (0.3 mg total) into the muscle as needed for anaphylaxis., Disp: 2 each, Rfl: 0 .  famotidine (PEPCID) 40 MG tablet, Take 1 tablet (40 mg total) by mouth 2 (two) times daily., Disp: 180 tablet, Rfl: 1 .  fluticasone (FLONASE) 50 MCG/ACT nasal spray, SPRAY 2 SPRAYS INTO EACH NOSTRIL EVERY DAY, Disp: 48 mL, Rfl: 0 .  glucose blood (ONETOUCH VERIO) test strip, Use as instructed, Disp: 100 each, Rfl: 12 .  ibuprofen (ADVIL) 600 MG tablet, Take 1 tablet (600 mg total) by mouth every 8 (eight) hours as needed., Disp: 30 tablet, Rfl: 0 .  Lancets (ONETOUCH ULTRASOFT) lancets, Use as instructed, Disp: 100 each, Rfl: 3 .  montelukast (SINGULAIR) 10 MG tablet, Take 1 tablet (10 mg total) by mouth at bedtime., Disp: 90 tablet, Rfl: 1 .  Multiple Vitamin (MULTIVITAMIN) LIQD, Take 5 mLs by mouth daily., Disp: , Rfl:  .  NON FORMULARY, CBD oil, patients takes a few drops each morning, Disp: , Rfl:  .  OZEMPIC, 0.25 OR 0.5 MG/DOSE, 2 MG/1.5ML SOPN, Inject 0.5 mg into the skin once a week., Disp: , Rfl:  .  rosuvastatin (CRESTOR) 10 MG tablet, Take 1 tablet (10 mg total) by mouth daily., Disp: 90 tablet, Rfl: 1 .  triamterene-hydrochlorothiazide (MAXZIDE-25) 37.5-25 MG tablet, Take 1 tablet by mouth daily., Disp: 90 tablet, Rfl: 1 .  vitamin B-12 (CYANOCOBALAMIN) 1000 MCG tablet, Take 1 tablet by mouth 3 (three) times a week. , Disp: , Rfl:   .  ZINC-VITAMIN C PO, Take 1 tablet by mouth daily., Disp: , Rfl:  .  fluticasone furoate-vilanterol (BREO ELLIPTA) 100-25 MCG/INH AEPB, Inhale 1 puff into the lungs daily., Disp: 60 each, Rfl: 0 .  levocetirizine (XYZAL) 5 MG tablet, Take 1 tablet (5 mg total) by mouth every evening., Disp: 90 tablet, Rfl: 1  Allergies  Allergen Reactions  . Levaquin [Levofloxacin In D5w] Swelling    Other reaction(s): Arthralgia (Joint Pain)  . Losartan     Other reaction(s): Angioedema  . Amlodipine-Olmesartan   . Egg Donia Pounds, Egg]   . Gabapentin     cns side effects.  . Glipizide  Other reaction(s): Abdominal pain  . Gluten Meal   . Invokana [Canagliflozin] Other (See Comments)    Bladder pain  . Lisinopril Swelling    Angioedema  . Metformin And Related Nausea Only  . Milk-Related Compounds   . Onglyza [Saxagliptin] Other (See Comments)    Increased PVC's  . Shrimp [Shellfish Allergy]   . Tramadol Nausea And Vomiting  . Actos [Pioglitazone] Other (See Comments) and Nausea And Vomiting    Bladder pain  . Atorvastatin Other (See Comments)  . Carvedilol Other (See Comments)    NUMBNESS, TINGLING, ACHING IN ARMS  . Prednisone Palpitations    I personally reviewed active problem list, medication list, allergies, family history, social history, health maintenance with the patient/caregiver today.   ROS  Constitutional: Negative for fever or weight change.  Respiratory: positive  for cough and shortness of breath.   Cardiovascular: positive  for pleuritic chest pain or palpitations.  Gastrointestinal: Negative for abdominal pain, no bowel changes.  Musculoskeletal: Negative for gait problem or joint swelling.  Skin: Negative for rash.  Neurological: Negative for dizziness or headache.  No other specific complaints in a complete review of systems (except as listed in HPI above).  Objective  Vitals:   09/18/20 1034  BP: 134/80  Pulse: 77  Resp: 16  Temp: 98 F (36.7 C)   TempSrc: Oral  SpO2: 98%  Weight: 202 lb (91.6 kg)  Height: '5\' 6"'  (1.676 m)    Body mass index is 32.6 kg/m.  Physical Exam  Constitutional: Patient appears well-developed and well-nourished. Obese  No distress.  HEENT: head atraumatic, normocephalic, pupils equal and reactive to light, ears normal,  neck supple, throat within normal limits Cardiovascular: Normal rate, regular rhythm and normal heart sounds.  No murmur heard. No BLE edema. Pulmonary/Chest: Effort normal and breath sounds normal. No respiratory distress. Abdominal: Soft.  There is no tenderness. Psychiatric: Patient has a normal mood and affect. behavior is normal. Judgment and thought content normal.  Recent Results (from the past 2160 hour(s))  Hemoglobin A1c     Status: None   Collection Time: 08/19/20 12:00 AM  Result Value Ref Range   Hemoglobin A1C 6.7      PHQ2/9: Depression screen [facility] 2/9 09/18/2020 03/18/2020 11/06/2019 10/09/2019 08/22/2019  Decreased Interest 0 0 0 0 0  Down, Depressed, Hopeless 0 0 0 0 0  PHQ - 2 Score 0 0 0 0 0  Altered sleeping - 0 0 0 0  Tired, decreased energy - 2 0 0 0  Change in appetite - 0 0 0 0  Feeling bad or failure about yourself  - 0 0 0 0  Trouble concentrating - 0 0 0 0  Moving slowly or fidgety/restless - 0 0 0 0  Suicidal thoughts - 0 0 0 0  PHQ-9 Score - 2 0 0 0  Difficult doing work/chores - Not difficult at all - Not difficult at all Not difficult at all  Some recent data might be hidden    phq 9 is negative  Fall Risk: Fall Risk  09/18/2020 03/18/2020 11/06/2019 10/09/2019 08/22/2019  Falls in the past year? 0 0 0 0 0  Number falls in past yr: 0 0 0 0 0  Injury with Fall? 0 0 0 0 0  Follow up - - - - -     Functional Status Survey: Is the patient deaf or have difficulty hearing?: No Does the patient have difficulty seeing, even when wearing glasses/contacts?: No Does the  patient have difficulty concentrating, remembering, or making decisions?: No Does  the patient have difficulty walking or climbing stairs?: No Does the patient have difficulty dressing or bathing?: No Does the patient have difficulty doing errands alone such as visiting a doctor's office or shopping?: No    Assessment & Plan  1. Essential hypertension  - atenolol (TENORMIN) 25 MG tablet; Take 1 tablet (25 mg total) by mouth daily.  Dispense: 90 tablet; Refill: 1 - triamterene-hydrochlorothiazide (MAXZIDE-25) 37.5-25 MG tablet; Take 1 tablet by mouth daily.  Dispense: 90 tablet; Refill: 1  2. Seasonal allergic rhinitis, unspecified trigger  - montelukast (SINGULAIR) 10 MG tablet; Take 1 tablet (10 mg total) by mouth at bedtime.  Dispense: 90 tablet; Refill: 1  3. Acute non-recurrent pansinusitis  Advised to be checked for COVID -19 if negative and symptoms persists we will send rx of antibiotics   4. Cough  - DG Chest 2 View; Future - fluticasone furoate-vilanterol (BREO ELLIPTA) 100-25 MCG/INH AEPB; Inhale 1 puff into the lungs daily.  Dispense: 60 each; Refill: 0  5. Dyslipidemia associated with type 2 diabetes mellitus (H[facility])  - rosuvastatin (CRESTOR) 10 MG tablet; Take 1 tablet (10 mg total) by mouth daily.  Dispense: 90 tablet; Refill: 1  6. Vitamin D deficiency   7. Pleuritic chest pain  - DG Chest 2 View; Future - fluticasone furoate-vilanterol (BREO ELLIPTA) 100-25 MCG/INH AEPB; Inhale 1 puff into the lungs daily.  Dispense: 60 each; Refill: 0  8. Food allergy

## 2020-09-18 ENCOUNTER — Ambulatory Visit (INDEPENDENT_AMBULATORY_CARE_PROVIDER_SITE_OTHER): Payer: Medicare Other | Admitting: Family Medicine

## 2020-09-18 ENCOUNTER — Ambulatory Visit
Admission: RE | Admit: 2020-09-18 | Discharge: 2020-09-18 | Disposition: A | Discharge status: Home / Self Care | Payer: Medicare Other | Source: Ambulatory Visit | Attending: Family Medicine | Admitting: Family Medicine

## 2020-09-18 ENCOUNTER — Encounter: Payer: Self-pay | Admitting: Family Medicine

## 2020-09-18 ENCOUNTER — Ambulatory Visit
Admission: RE | Admit: 2020-09-18 | Discharge: 2020-09-18 | Disposition: A | Discharge status: Home / Self Care | Payer: Medicare Other | Attending: Family Medicine | Admitting: Family Medicine

## 2020-09-18 ENCOUNTER — Other Ambulatory Visit: Payer: Self-pay

## 2020-09-18 ENCOUNTER — Telehealth: Payer: Self-pay

## 2020-09-18 VITALS — BP 134/80 | HR 77 | Temp 98.0°F | Resp 16 | Ht 66.0 in | Wt 202.0 lb

## 2020-09-18 DIAGNOSIS — E1169 Type 2 diabetes mellitus with other specified complication: Secondary | ICD-10-CM

## 2020-09-18 DIAGNOSIS — J302 Other seasonal allergic rhinitis: Secondary | ICD-10-CM

## 2020-09-18 DIAGNOSIS — R05 Cough: Secondary | ICD-10-CM | POA: Insufficient documentation

## 2020-09-18 DIAGNOSIS — R0781 Pleurodynia: Secondary | ICD-10-CM | POA: Diagnosis not present

## 2020-09-18 DIAGNOSIS — E785 Hyperlipidemia, unspecified: Secondary | ICD-10-CM

## 2020-09-18 DIAGNOSIS — R079 Chest pain, unspecified: Secondary | ICD-10-CM | POA: Diagnosis not present

## 2020-09-18 DIAGNOSIS — I10 Essential (primary) hypertension: Secondary | ICD-10-CM

## 2020-09-18 DIAGNOSIS — Z91018 Allergy to other foods: Secondary | ICD-10-CM

## 2020-09-18 DIAGNOSIS — R059 Cough, unspecified: Secondary | ICD-10-CM

## 2020-09-18 DIAGNOSIS — J0140 Acute pansinusitis, unspecified: Secondary | ICD-10-CM

## 2020-09-18 DIAGNOSIS — E559 Vitamin D deficiency, unspecified: Secondary | ICD-10-CM

## 2020-09-18 MED ORDER — MONTELUKAST SODIUM 10 MG PO TABS: 10.0000 mg | ORAL_TABLET | Freq: Every day | ORAL | 1 refills | Status: DC

## 2020-09-18 MED ORDER — BREO ELLIPTA 100-25 MCG/INH IN AEPB: 1.0000 | INHALATION_SPRAY | Freq: Every day | RESPIRATORY_TRACT | 0 refills | Status: DC

## 2020-09-18 MED ORDER — ATENOLOL 25 MG PO TABS: 25.0000 mg | ORAL_TABLET | Freq: Every day | ORAL | 1 refills | Status: DC

## 2020-09-18 MED ORDER — TRIAMTERENE-HCTZ 37.5-25 MG PO TABS: 1.0000 | ORAL_TABLET | Freq: Every day | ORAL | 1 refills | Status: DC

## 2020-09-18 MED ORDER — LEVOCETIRIZINE DIHYDROCHLORIDE 5 MG PO TABS: 5.0000 mg | ORAL_TABLET | Freq: Every evening | ORAL | 1 refills | Status: DC

## 2020-09-18 MED ORDER — ROSUVASTATIN CALCIUM 10 MG PO TABS: 10.0000 mg | ORAL_TABLET | Freq: Every day | ORAL | 1 refills | Status: DC

## 2020-09-18 NOTE — Telephone Encounter (Signed)
Copied from [place] [id number]. Topic: General - Other >> Sep 18, 2020  2:11 PM Leward Quan A wrote: Reason for CRM: Patient called to inform Dr Ancil Boozer that her rapid covid test came back negative and asking if antibiotics can be sent to the pharmacy please. Any questions please call Ph# [phone]

## 2020-09-19 ENCOUNTER — Other Ambulatory Visit: Payer: Self-pay

## 2020-09-19 NOTE — Telephone Encounter (Signed)
Called patient. She will bring in PCR report because she is unsure how to upload it. As soon as report arrives we will call you and let you know.

## 2020-09-23 MED ORDER — AZITHROMYCIN 250 MG PO TABS: ORAL_TABLET | ORAL | 0 refills | Status: DC

## 2020-10-06 ENCOUNTER — Telehealth: Payer: Self-pay | Admitting: Family Medicine

## 2020-10-07 ENCOUNTER — Ambulatory Visit: Payer: Medicare Other

## 2020-10-07 ENCOUNTER — Other Ambulatory Visit: Payer: Self-pay

## 2020-10-08 ENCOUNTER — Telehealth: Payer: Self-pay | Admitting: Family Medicine

## 2020-10-08 NOTE — Telephone Encounter (Signed)
Copied from [place] [phone]. Topic: Medicare AWV >> Oct 08, 2020 11:41 AM Cher Nakai R wrote: Reason for CRM:  No answer unable to leave message for patient to call back and schedule the Medicare Annual Wellness Visit (AWV) in office or virtual  Last AWV  08/16/2019  Please schedule at anytime with [place].  40 minute appointment  Any questions, please contact me at [phone]

## 2020-10-13 ENCOUNTER — Other Ambulatory Visit: Payer: Self-pay

## 2020-10-13 ENCOUNTER — Ambulatory Visit (INDEPENDENT_AMBULATORY_CARE_PROVIDER_SITE_OTHER): Payer: Medicare Other | Admitting: Emergency Medicine

## 2020-10-13 DIAGNOSIS — Z23 Encounter for immunization: Secondary | ICD-10-CM

## 2020-10-16 ENCOUNTER — Other Ambulatory Visit: Payer: Self-pay

## 2020-10-16 DIAGNOSIS — I10 Essential (primary) hypertension: Secondary | ICD-10-CM

## 2020-10-16 DIAGNOSIS — E1169 Type 2 diabetes mellitus with other specified complication: Secondary | ICD-10-CM

## 2020-10-16 DIAGNOSIS — L509 Urticaria, unspecified: Secondary | ICD-10-CM

## 2020-10-16 DIAGNOSIS — J302 Other seasonal allergic rhinitis: Secondary | ICD-10-CM

## 2020-10-16 MED ORDER — FAMOTIDINE 40 MG PO TABS: 40.0000 mg | ORAL_TABLET | Freq: Two times a day (BID) | ORAL | 1 refills | Status: DC

## 2020-10-16 MED ORDER — TRIAMTERENE-HCTZ 37.5-25 MG PO TABS: 1.0000 | ORAL_TABLET | Freq: Every day | ORAL | 1 refills | Status: DC

## 2020-10-16 MED ORDER — ROSUVASTATIN CALCIUM 10 MG PO TABS: 10.0000 mg | ORAL_TABLET | Freq: Every day | ORAL | 1 refills | Status: DC

## 2020-10-16 MED ORDER — MONTELUKAST SODIUM 10 MG PO TABS: 10.0000 mg | ORAL_TABLET | Freq: Every day | ORAL | 1 refills | Status: DC

## 2020-10-16 MED ORDER — ATENOLOL 25 MG PO TABS: 25.0000 mg | ORAL_TABLET | Freq: Every day | ORAL | 1 refills | Status: DC

## 2020-10-16 MED ORDER — LEVOCETIRIZINE DIHYDROCHLORIDE 5 MG PO TABS: 5.0000 mg | ORAL_TABLET | Freq: Every evening | ORAL | 1 refills | Status: DC

## 2020-11-04 ENCOUNTER — Telehealth: Payer: Self-pay | Admitting: Family Medicine

## 2020-11-04 ENCOUNTER — Other Ambulatory Visit: Payer: Self-pay | Admitting: Family Medicine

## 2020-11-04 DIAGNOSIS — R059 Cough, unspecified: Secondary | ICD-10-CM

## 2020-11-04 DIAGNOSIS — R0781 Pleurodynia: Secondary | ICD-10-CM

## 2020-11-04 NOTE — Telephone Encounter (Signed)
Patient states that she had AWV completed by [facility] and that she gave this report to the [facility]?  She would like for [facility] to contact her.  I tried to get her to scheduled with [facility] but she did not want to since her insurance told her that they do a more in depth AWV than the providers office can do?

## 2020-11-04 NOTE — Telephone Encounter (Signed)
Attempted to reach patient to discuss need for AWV at PCP and answer any questions she may have. Left msg for patient to reach me directly at [phone] or contact the office.

## 2020-11-04 NOTE — Telephone Encounter (Signed)
The only time I have seen this patient is one 08/16/19 for a virtual visit so she did not give me the [facility] but it is scanned into her chart. I understand if patients do not want to do both but her insurance should not have said that their visit is more in depth. I will contact the patient to let her know.

## 2020-11-20 DIAGNOSIS — N6452 Nipple discharge: Secondary | ICD-10-CM | POA: Diagnosis not present

## 2020-11-20 DIAGNOSIS — Z853 Personal history of malignant neoplasm of breast: Secondary | ICD-10-CM | POA: Diagnosis not present

## 2020-11-20 DIAGNOSIS — R0789 Other chest pain: Secondary | ICD-10-CM | POA: Diagnosis not present

## 2020-11-28 ENCOUNTER — Other Ambulatory Visit: Payer: Self-pay | Admitting: Family Medicine

## 2020-11-28 DIAGNOSIS — R0781 Pleurodynia: Secondary | ICD-10-CM

## 2020-11-28 DIAGNOSIS — R059 Cough, unspecified: Secondary | ICD-10-CM

## 2020-12-23 ENCOUNTER — Ambulatory Visit (INDEPENDENT_AMBULATORY_CARE_PROVIDER_SITE_OTHER): Payer: Medicare Other

## 2020-12-23 DIAGNOSIS — Z0000 Encounter for general adult medical examination without abnormal findings: Secondary | ICD-10-CM

## 2020-12-23 NOTE — Progress Notes (Signed)
Subjective:   Morgan Peterson is a 70 y.o. female who presents for Medicare Annual (Subsequent) preventive examination.  Virtual Visit via Telephone Note  I connected with  Morgan Peterson on 12/23/20 at 11:20 AM EST by telephone and verified that I am speaking with the correct person using two identifiers.  Location: Patient: home Provider: [place] Persons participating in the virtual visit: [name]   I discussed the limitations, risks, security and privacy concerns of performing an evaluation and management service by telephone and the availability of in person appointments. The patient expressed understanding and agreed to proceed.  Interactive audio and video telecommunications were attempted between this nurse and patient, however failed, due to patient having technical difficulties OR patient did not have access to video capability.  We continued and completed visit with audio only.  Some vital signs may be absent or patient reported.   Clemetine Marker, LPN    Review of Systems     Cardiac Risk Factors include: advanced age (>52mn, >>3women);diabetes mellitus;dyslipidemia;hypertension;obesity (BMI >30kg/m2)     Objective:    There were no vitals filed for this visit. There is no height or weight on file to calculate BMI.  Advanced Directives 12/23/2020 08/16/2019 08/18/2017 05/02/2017 03/23/2017 10/28/2016 09/03/2016  Does Patient Have a Medical Advance Directive? No No No No No No No  Type of Advance Directive - - - - - - -  Does patient want to make changes to medical advance directive? - Yes (MAU/Ambulatory/Procedural Areas - Information given) - - - - -  Copy of [place] Chart? - - - - - - -  Would patient like information on creating a medical advance directive? No - Patient declined - - - - No - patient declined information No - patient declined information    Current Medications (verified) Outpatient Encounter Medications as of  12/23/2020  Medication Sig  . aspirin EC 81 MG tablet Take 1 tablet (81 mg total) by mouth daily.  .Marland Kitchenatenolol (TENORMIN) 25 MG tablet Take 1 tablet (25 mg total) by mouth daily.  . Blood Glucose Monitoring Suppl (ONETOUCH VERIO) w/Device KIT 1 Device by Does not apply route once.  . Calcium Carb-Cholecalciferol 200-10 MG-MCG CAPS Take by mouth.  . cholecalciferol (VITAMIN D) 1000 UNITS tablet Take 1,000 Units by mouth daily.  .Marland KitchenEPINEPHrine (EPIPEN 2-PAK) 0.3 mg/0.3 mL IJ SOAJ injection Inject 0.3 mLs (0.3 mg total) into the muscle as needed for anaphylaxis.  . famotidine (PEPCID) 40 MG tablet Take 1 tablet (40 mg total) by mouth 2 (two) times daily.  . fluticasone (FLONASE) 50 MCG/ACT nasal spray SPRAY 2 SPRAYS INTO EACH NOSTRIL EVERY DAY  . glucose blood (ONETOUCH VERIO) test strip Use as instructed  . ibuprofen (ADVIL) 600 MG tablet Take 1 tablet (600 mg total) by mouth every 8 (eight) hours as needed.  . Lancets (ONETOUCH ULTRASOFT) lancets Use as instructed  . levocetirizine (XYZAL) 5 MG tablet Take 1 tablet (5 mg total) by mouth every evening.  . montelukast (SINGULAIR) 10 MG tablet Take 1 tablet (10 mg total) by mouth at bedtime.  . Multiple Vitamin (MULTIVITAMIN) LIQD Take 5 mLs by mouth daily.  . NON FORMULARY CBD oil, patients takes a few drops each morning  . OZEMPIC, 0.25 OR 0.5 MG/DOSE, 2 MG/1.5ML SOPN Inject 0.5 mg into the skin once a week.  . rosuvastatin (CRESTOR) 5 MG tablet Take 5 mg by mouth daily.  .Marland Kitchentriamterene-hydrochlorothiazide (MAXZIDE-25) 37.5-25 MG  tablet Take 1 tablet by mouth daily.  . vitamin B-12 (CYANOCOBALAMIN) 1000 MCG tablet Take 1 tablet by mouth 3 (three) times a week.   Marland Kitchen ZINC-VITAMIN C PO Take 1 tablet by mouth daily.  Marland Kitchen BREO ELLIPTA 100-25 MCG/INH AEPB INHALE 1 PUFF INTO THE LUNGS DAILY (Patient not taking: Reported on 12/23/2020)  . [DISCONTINUED] azithromycin (ZITHROMAX) 250 MG tablet Take 2 tablets on day 1 and 1 tablet daily for four days.  .  [DISCONTINUED] rosuvastatin (CRESTOR) 10 MG tablet Take 1 tablet (10 mg total) by mouth daily.   No facility-administered encounter medications on file as of 12/23/2020.    Allergies (verified) Levaquin [levofloxacin in d5w]; Losartan; Amlodipine-olmesartan; Egg white [albumen, egg]; Gabapentin; Glipizide; Gluten meal; Invokana [canagliflozin]; Lisinopril; Metformin and related; Milk-related compounds; Onglyza [saxagliptin]; Shrimp [shellfish allergy]; Tramadol; Actos [pioglitazone]; Atorvastatin; Carvedilol; and Prednisone   History: Past Medical History:  Diagnosis Date  . Cancer [facility]) 2010   Right Breast  . Diabetes mellitus without complication ([place]) 9169  . Hiatal hernia   . Hypertension   . IBS (irritable bowel syndrome)   . 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 Arimidex therapy completed September 2015  . Personal history of malignant neoplasm of breast 2010  . Personal history of radiation therapy 2010   mammosite   Past Surgical History:  Procedure Laterality Date  . ABDOMINAL EXPLORATION SURGERY  2000  . BREAST BIOPSY Right 2010   +   . BREAST EXCISIONAL BIOPSY Right 2010   + mammo site inasive mammo ca  . BREAST LUMPECTOMY Right 2010   [place]  . COLONOSCOPY  07/19/2012   Normal exam, Dr. Bary Castilla  . TUBAL LIGATION  20 years ago   Family History  Problem Relation Age of Onset  . Hypertension Mother   . Hyperlipidemia Mother   . Diabetes Father   . Kidney disease Father   . Diabetes Brother   . Colon cancer Maternal Uncle   . Breast cancer Maternal Aunt    Social History   Socioeconomic History  . Marital status: Married    Spouse name: Morgan Peterson  . Number of children: 2  . Years of education: Not on file  . Highest education level: Not on file  Occupational History  . 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 Smoking status: Former Smoker    Packs/day: 0.25    Years: 10.00    Pack years: 2.50    Types: Cigarettes    Start date: 78    Quit date: 1988    Years since quitting: 34.0  . Smokeless tobacco: Never Used  . Tobacco comment: smoking cessation materials not required  Vaping Use  . Vaping Use: Never used  Substance and Sexual Activity  . Alcohol use: Yes    Alcohol/week: 0.0 standard drinks    Comment: wine  . Drug use: No  . Sexual activity: Not Currently    Comment: husband has ED  Other Topics Concern  . Not on file  Social History Narrative  . Not on file   Social Determinants of Health   Financial Resource Strain: Low Risk   . Difficulty of Paying Living Expenses: Not hard at all  Food Insecurity: No Food Insecurity  . Worried About Charity fundraiser in the  Last Year: Never true  . Ran Out of Food in the Last Year: Never true  Transportation Needs: No Transportation Needs  . Lack of Transportation (Medical): No  . Lack of Transportation (Non-Medical): No  Physical Activity: Inactive  . Days of Exercise per Week: 0 days  . Minutes of Exercise per Session: 0 min  Stress: Stress Concern Present  . Feeling of Stress : Rather much  Social Connections: Socially Integrated  . Frequency of Communication with Friends and Family: More than three times a week  . Frequency of Social Gatherings with Friends and Family: More than three times a week  . Attends Religious Services: More than 4 times per year  . Active Member of Clubs or Organizations: Yes  . Attends Archivist Meetings: More than 4 times per year  . Marital Status: Married    Tobacco Counseling Counseling given: Not Answered Comment: smoking cessation materials not required   Clinical Intake:  Pre-visit preparation completed: Yes  Pain : No/denies pain     Nutritional Risks: None Diabetes: Yes CBG done?: No Did pt. bring in CBG  monitor from home?: No  How often do you need to have someone help you when you read instructions, pamphlets, or other written materials from your doctor or pharmacy?: 1 - Never  Nutrition Risk Assessment:  Has the patient had any N/V/D within the last 2 months?  No  Does the patient have any non-healing wounds?  No  Has the patient had any unintentional weight loss or weight gain?  No   Diabetes:  Is the patient diabetic?  Yes  If diabetic, was a CBG obtained today?  No  Did the patient bring in their glucometer from home?  No  How often do you monitor your CBG's? daily.   Financial Strains and Diabetes Management:  Are you having any financial strains with the device, your supplies or your medication? No .  Does the patient want to be seen by Chronic Care Management for management of their diabetes?  No  Would the patient like to be referred to a Nutritionist or for Diabetic Management?  No   Diabetic Exams:  Diabetic Eye Exam: Completed 03/24/20. Dr. Ellin Mayhew.  Diabetic Foot Exam: Completed 01/02/20.   Interpreter Needed?: No  Information entered by :: Clemetine Marker LPN   Activities of Daily Living In your present state of health, do you have any difficulty performing the following activities: 12/23/2020 09/18/2020  Hearing? N N  Comment declines hearing aids -  Vision? N N  Difficulty concentrating or making decisions? N N  Walking or climbing stairs? N N  Dressing or bathing? N N  Doing errands, shopping? N N  Preparing Food and eating ? N -  Using the Toilet? N -  In the past six months, have you accidently leaked urine? N -  Do you have problems with loss of bowel control? N -  Managing your Medications? N -  Managing your Finances? N -  Housekeeping or managing your Housekeeping? N -  Some recent data might be hidden    Patient Care Team: Steele Sizer, MD as PCP - General (Family Medicine) Bary Castilla, Forest Gleason, MD (General Surgery) Carloyn Manner, MD as  Referring Physician (Otolaryngology) Steele Sizer, MD as Attending Physician (Family Medicine) Yolonda Kida, MD as Consulting Physician (Cardiology)  Indicate any recent Medical Services you may have received from other than [facility] providers in the past year (date may be approximate).  Assessment:   This is a routine wellness examination for Mella.  Hearing/Vision screen  Hearing Screening   '125Hz'  '250Hz'  '500Hz'  '1000Hz'  '2000Hz'  '3000Hz'  '4000Hz'  '6000Hz'  '8000Hz'   Right ear:           Left ear:           Comments: Pt denies hearing difficulty  Vision Screening Comments: Annual vision screenings with Dr. Orion Modest  Dietary issues and exercise activities discussed: Current Exercise Habits: The patient does not participate in regular exercise at present, Exercise limited by: None identified  Goals    . Weight (lb) < 200 lb (90.7 kg)     Pt would like to lose weight over the next year with healthy eating and physical activity       Depression Screen PHQ 2/9 Scores 12/23/2020 09/18/2020 03/18/2020 11/06/2019 10/09/2019 08/22/2019 08/22/2019  PHQ - 2 Score 0 0 0 0 0 0 0  PHQ- 9 Score - - 2 0 0 0 0    Fall Risk Fall Risk  12/23/2020 09/18/2020 03/18/2020 11/06/2019 10/09/2019  Falls in the past year? 0 0 0 0 0  Number falls in past yr: 0 0 0 0 0  Injury with Fall? 0 0 0 0 0  Risk for fall due to : No Fall Risks - - - -  Follow up Falls prevention discussed - - - -    FALL RISK PREVENTION PERTAINING TO THE HOME:  Any stairs in or around the home? Yes  If so, are there any without handrails? No  Home free of loose throw rugs in walkways, pet beds, electrical cords, etc? Yes  Adequate lighting in your home to reduce risk of falls? Yes   ASSISTIVE DEVICES UTILIZED TO PREVENT FALLS:  Life alert? No  Use of a cane, walker or w/c? No  Grab bars in the bathroom? Yes  Shower chair or bench in shower? Yes  Elevated toilet seat or a handicapped toilet? No   TIMED UP AND GO:  Was the  test performed? No . Telephonic visit.   Cognitive Function: Normal cognitive status assessed by direct observation by this Nurse Health Advisor. No abnormalities found.          Immunizations Immunization History  Administered Date(s) Administered  . Fluad Quad(high Dose 65+) 10/09/2019, 10/13/2020  . Influenza Split 11/13/2012  . Influenza, High Dose Seasonal PF 08/18/2017, 10/13/2018  . Influenza, Seasonal, Injecte, Preservative Fre 11/19/2010, 08/17/2011  . Influenza,inj,Quad PF,6+ Mos 11/07/2013, 11/21/2014, 10/03/2015, 09/03/2016  . Influenza-Unspecified 11/07/2013, 11/21/2014, 10/03/2015, 09/03/2016  . PFIZER SARS-COV-2 Vaccination 03/31/2020, 04/23/2020  . Pneumococcal Conjugate-13 05/14/2014, 11/06/2019  . Pneumococcal Polysaccharide-23 11/19/2010, 11/26/2016  . Tdap 11/19/2010  . Zoster 07/23/2013    TDAP status: Due, Education has been provided regarding the importance of this vaccine. Advised may receive this vaccine at local pharmacy or Health Dept. Aware to provide a copy of the vaccination record if obtained from local pharmacy or Health Dept. Verbalized acceptance and understanding.  Flu Vaccine status: Up to date  Pneumococcal vaccine status: Up to date  Covid-19 vaccine status: Completed vaccines  Qualifies for Shingles Vaccine? Yes   Zostavax completed Yes   Shingrix Completed?: No.    Education has been provided regarding the importance of this vaccine. Patient has been advised to call insurance company to determine out of pocket expense if they have not yet received this vaccine. Advised may also receive vaccine at local pharmacy or Health Dept. Verbalized acceptance and understanding.  Screening Tests Health Maintenance  Topic Date Due  . OPHTHALMOLOGY EXAM  06/05/2020  . COVID-19 Vaccine (3 - Booster for Pfizer series) 10/24/2020  . TETANUS/TDAP  11/19/2020  . FOOT EXAM  01/01/2021  . HEMOGLOBIN A1C  02/16/2021  . URINE MICROALBUMIN  03/18/2021  .  MAMMOGRAM  02/19/2022  . COLONOSCOPY (Pts 45-84yr Insurance coverage will need to be confirmed)  07/19/2022  . INFLUENZA VACCINE  Completed  . DEXA SCAN  Completed  . Hepatitis C Screening  Completed  . PNA vac Low Risk Adult  Completed    Health Maintenance  Health Maintenance Due  Topic Date Due  . OPHTHALMOLOGY EXAM  06/05/2020  . COVID-19 Vaccine (3 - Booster for Pfizer series) 10/24/2020  . TETANUS/TDAP  11/19/2020    Colorectal cancer screening: Type of screening: Colonoscopy. Completed 07/19/12. Repeat every 10 years  Mammogram status: Completed 02/20/20. Repeat every year  Bone Density status: Completed 11/08/12. Results reflect: Bone density results: NORMAL. Repeat every 2 years.  Lung Cancer Screening: (Low Dose CT Chest recommended if Age 70-80years, 30 pack-year currently smoking OR have quit w/in 15years.) does not qualify.   Additional Screening:  Hepatitis C Screening: does qualify; Completed 11/22/12  Vision Screening: Recommended annual ophthalmology exams for early detection of glaucoma and other disorders of the eye. Is the patient up to date with their annual eye exam?  Yes  Who is the provider or what is the name of the office in which the patient attends annual eye exams? Dr. WEllin Mayhew Dental Screening: Recommended annual dental exams for proper oral hygiene  Community Resource Referral / Chronic Care Management: CRR required this visit?  No   CCM required this visit?  No      Plan:     I have personally reviewed and noted the following in the patient's chart:   . Medical and social history . Use of alcohol, tobacco or illicit drugs  . Current medications and supplements . Functional ability and status . Nutritional status . Physical activity . Advanced directives . List of other physicians . Hospitalizations, surgeries, and ER visits in previous 12 months . Vitals . Screenings to include cognitive, depression, and falls . Referrals and  appointments  In addition, I have reviewed and discussed with patient certain preventive protocols, quality metrics, and best practice recommendations. A written personalized care plan for preventive services as well as general preventive health recommendations were provided to patient.     KClemetine Marker LPN   13/03/1936  Nurse Notes: pt c/o pain associated with hiatal hernia, specifically when she eats certain things. Pt states hernia has become more bothersome over the last 6 months. Pt requested appt with Dr. SAncil Boozerto discuss; scheduled for 01/20/21.

## 2020-12-23 NOTE — Patient Instructions (Signed)
[name] , Thank you for taking time to come for your Medicare Wellness Visit. I appreciate your ongoing commitment to your health goals. Please review the following plan we discussed and let me know if I can assist you in the future.   Screening recommendations/referrals: Colonoscopy: done 07/19/12. Repeat in 2023 Mammogram: done 02/20/20. Ordered by Dr. Lemar Livings Bone Density: done 11/08/12. Due.   Recommended yearly ophthalmology/optometry visit for glaucoma screening and checkup Recommended yearly dental visit for hygiene and checkup  Vaccinations: Influenza vaccine: done 10/13/20 Pneumococcal vaccine: done 11/06/19 Tdap vaccine: DUE Shingles vaccine: Shingrix discussed. Please contact your pharmacy for coverage information.  Covid-19: done 03/31/20 & 04/23/20. Please bring a copy of your vaccine record to next appt for booster information.   Advanced directives: Advance directive discussed with you today. Even though you declined this today please call our office should you change your mind and we can give you the proper paperwork for you to fill out.  Conditions/risks identified: Recommend healthy eating and physical activity   Next appointment: Follow up in one year for your annual wellness visit    Preventive Care 65 Years and Older, Female Preventive care refers to lifestyle choices and visits with your health care provider that can promote health and wellness. What does preventive care include?  A yearly physical exam. This is also called an annual well check.  Dental exams once or twice a year.  Routine eye exams. Ask your health care provider how often you should have your eyes checked.  Personal lifestyle choices, including:  Daily care of your teeth and gums.  Regular physical activity.  Eating a healthy diet.  Avoiding tobacco and drug use.  Limiting alcohol use.  Practicing safe sex.  Taking low-dose aspirin every day.  Taking vitamin and mineral  supplements as recommended by your health care provider. What happens during an annual well check? The services and screenings done by your health care provider during your annual well check will depend on your age, overall health, lifestyle risk factors, and family history of disease. Counseling  Your health care provider may ask you questions about your:  Alcohol use.  Tobacco use.  Drug use.  Emotional well-being.  Home and relationship well-being.  Sexual activity.  Eating habits.  History of falls.  Memory and ability to understand (cognition).  Work and work Astronomer.  Reproductive health. Screening  You may have the following tests or measurements:  Height, weight, and BMI.  Blood pressure.  Lipid and cholesterol levels. These may be checked every 5 years, or more frequently if you are over 29 years old.  Skin check.  Lung cancer screening. You may have this screening every year starting at age 72 if you have a 30-pack-year history of smoking and currently smoke or have quit within the past 15 years.  Fecal occult blood test (FOBT) of the stool. You may have this test every year starting at age 49.  Flexible sigmoidoscopy or colonoscopy. You may have a sigmoidoscopy every 5 years or a colonoscopy every 10 years starting at age 11.  Hepatitis C blood test.  Hepatitis B blood test.  Sexually transmitted disease (STD) testing.  Diabetes screening. This is done by checking your blood sugar (glucose) after you have not eaten for a while (fasting). You may have this done every 1-3 years.  Bone density scan. This is done to screen for osteoporosis. You may have this done starting at age 48.  Mammogram. This may be done every  1-2 years. Talk to your health care provider about how often you should have regular mammograms. Talk with your health care provider about your test results, treatment options, and if necessary, the need for more tests. Vaccines  Your  health care provider may recommend certain vaccines, such as:  Influenza vaccine. This is recommended every year.  Tetanus, diphtheria, and acellular pertussis (Tdap, Td) vaccine. You may need a Td booster every 10 years.  Zoster vaccine. You may need this after age 18.  Pneumococcal 13-valent conjugate (PCV13) vaccine. One dose is recommended after age 43.  Pneumococcal polysaccharide (PPSV23) vaccine. One dose is recommended after age 18. Talk to your health care provider about which screenings and vaccines you need and how often you need them. This information is not intended to replace advice given to you by your health care provider. Make sure you discuss any questions you have with your health care provider. Document Released: 01/02/2016 Document Revised: 08/25/2016 Document Reviewed: 10/07/2015 Elsevier Interactive Patient Education  2017 [place] Prevention in the Home Falls can cause injuries. They can happen to people of all ages. There are many things you can do to make your home safe and to help prevent falls. What can I do on the outside of my home?  Regularly fix the edges of walkways and driveways and fix any cracks.  Remove anything that might make you trip as you walk through a door, such as a raised step or threshold.  Trim any bushes or trees on the path to your home.  Use bright outdoor lighting.  Clear any walking paths of anything that might make someone trip, such as rocks or tools.  Regularly check to see if handrails are loose or broken. Make sure that both sides of any steps have handrails.  Any raised decks and porches should have guardrails on the edges.  Have any leaves, snow, or ice cleared regularly.  Use sand or salt on walking paths during winter.  Clean up any spills in your garage right away. This includes oil or grease spills. What can I do in the bathroom?  Use night lights.  Install grab bars by the toilet and in the tub and  shower. Do not use towel bars as grab bars.  Use non-skid mats or decals in the tub or shower.  If you need to sit down in the shower, use a plastic, non-slip stool.  Keep the floor dry. Clean up any water that spills on the floor as soon as it happens.  Remove soap buildup in the tub or shower regularly.  Attach bath mats securely with double-sided non-slip rug tape.  Do not have throw rugs and other things on the floor that can make you trip. What can I do in the bedroom?  Use night lights.  Make sure that you have a light by your bed that is easy to reach.  Do not use any sheets or blankets that are too big for your bed. They should not hang down onto the floor.  Have a firm chair that has side arms. You can use this for support while you get dressed.  Do not have throw rugs and other things on the floor that can make you trip. What can I do in the kitchen?  Clean up any spills right away.  Avoid walking on wet floors.  Keep items that you use a lot in easy-to-reach places.  If you need to reach something above you, use a strong  step stool that has a grab bar.  Keep electrical cords out of the way.  Do not use floor polish or wax that makes floors slippery. If you must use wax, use non-skid floor wax.  Do not have throw rugs and other things on the floor that can make you trip. What can I do with my stairs?  Do not leave any items on the stairs.  Make sure that there are handrails on both sides of the stairs and use them. Fix handrails that are broken or loose. Make sure that handrails are as long as the stairways.  Check any carpeting to make sure that it is firmly attached to the stairs. Fix any carpet that is loose or worn.  Avoid having throw rugs at the top or bottom of the stairs. If you do have throw rugs, attach them to the floor with carpet tape.  Make sure that you have a light switch at the top of the stairs and the bottom of the stairs. If you do not  have them, ask someone to add them for you. What else can I do to help prevent falls?  Wear shoes that:  Do not have high heels.  Have rubber bottoms.  Are comfortable and fit you well.  Are closed at the toe. Do not wear sandals.  If you use a stepladder:  Make sure that it is fully opened. Do not climb a closed stepladder.  Make sure that both sides of the stepladder are locked into place.  Ask someone to hold it for you, if possible.  Clearly mark and make sure that you can see:  Any grab bars or handrails.  First and last steps.  Where the edge of each step is.  Use tools that help you move around (mobility aids) if they are needed. These include:  Canes.  Walkers.  Scooters.  Crutches.  Turn on the lights when you go into a dark area. Replace any light bulbs as soon as they burn out.  Set up your furniture so you have a clear path. Avoid moving your furniture around.  If any of your floors are uneven, fix them.  If there are any pets around you, be aware of where they are.  Review your medicines with your doctor. Some medicines can make you feel dizzy. This can increase your chance of falling. Ask your doctor what other things that you can do to help prevent falls. This information is not intended to replace advice given to you by your health care provider. Make sure you discuss any questions you have with your health care provider. Document Released: 10/02/2009 Document Revised: 05/13/2016 Document Reviewed: 01/10/2015 Elsevier Interactive Patient Education  2017 [employer].

## 2020-12-31 ENCOUNTER — Other Ambulatory Visit: Payer: Self-pay | Admitting: Family Medicine

## 2020-12-31 DIAGNOSIS — Z13820 Encounter for screening for osteoporosis: Secondary | ICD-10-CM

## 2020-12-31 DIAGNOSIS — E2839 Other primary ovarian failure: Secondary | ICD-10-CM

## 2021-01-13 DIAGNOSIS — N6452 Nipple discharge: Secondary | ICD-10-CM | POA: Diagnosis not present

## 2021-01-20 ENCOUNTER — Ambulatory Visit (INDEPENDENT_AMBULATORY_CARE_PROVIDER_SITE_OTHER): Payer: Medicare Other | Admitting: Family Medicine

## 2021-01-20 ENCOUNTER — Encounter: Payer: Self-pay | Admitting: Family Medicine

## 2021-01-20 ENCOUNTER — Other Ambulatory Visit: Payer: Self-pay

## 2021-01-20 VITALS — BP 130/80 | HR 70 | Temp 98.0°F | Resp 16 | Ht 66.0 in | Wt 206.6 lb

## 2021-01-20 DIAGNOSIS — I10 Essential (primary) hypertension: Secondary | ICD-10-CM | POA: Diagnosis not present

## 2021-01-20 DIAGNOSIS — E1169 Type 2 diabetes mellitus with other specified complication: Secondary | ICD-10-CM | POA: Diagnosis not present

## 2021-01-20 DIAGNOSIS — E559 Vitamin D deficiency, unspecified: Secondary | ICD-10-CM

## 2021-01-20 DIAGNOSIS — S91312A Laceration without foreign body, left foot, initial encounter: Secondary | ICD-10-CM

## 2021-01-20 DIAGNOSIS — L509 Urticaria, unspecified: Secondary | ICD-10-CM | POA: Diagnosis not present

## 2021-01-20 DIAGNOSIS — Z91018 Allergy to other foods: Secondary | ICD-10-CM | POA: Diagnosis not present

## 2021-01-20 DIAGNOSIS — I152 Hypertension secondary to endocrine disorders: Secondary | ICD-10-CM

## 2021-01-20 DIAGNOSIS — J302 Other seasonal allergic rhinitis: Secondary | ICD-10-CM

## 2021-01-20 DIAGNOSIS — K141 Geographic tongue: Secondary | ICD-10-CM | POA: Diagnosis not present

## 2021-01-20 DIAGNOSIS — Z23 Encounter for immunization: Secondary | ICD-10-CM | POA: Diagnosis not present

## 2021-01-20 DIAGNOSIS — K449 Diaphragmatic hernia without obstruction or gangrene: Secondary | ICD-10-CM

## 2021-01-20 DIAGNOSIS — T783XXD Angioneurotic edema, subsequent encounter: Secondary | ICD-10-CM | POA: Diagnosis not present

## 2021-01-20 DIAGNOSIS — E1159 Type 2 diabetes mellitus with other circulatory complications: Secondary | ICD-10-CM | POA: Diagnosis not present

## 2021-01-20 DIAGNOSIS — E785 Hyperlipidemia, unspecified: Secondary | ICD-10-CM

## 2021-01-20 MED ORDER — ATENOLOL 25 MG PO TABS: 25.0000 mg | ORAL_TABLET | Freq: Every day | ORAL | 1 refills | Status: DC

## 2021-01-20 MED ORDER — EPINEPHRINE 0.3 MG/0.3ML IJ SOAJ: 0.3000 mg | INTRAMUSCULAR | 0 refills | Status: DC | PRN

## 2021-01-20 MED ORDER — FAMOTIDINE 40 MG PO TABS: 40.0000 mg | ORAL_TABLET | Freq: Two times a day (BID) | ORAL | 1 refills | Status: DC

## 2021-01-20 MED ORDER — MONTELUKAST SODIUM 10 MG PO TABS: 10.0000 mg | ORAL_TABLET | Freq: Every day | ORAL | 1 refills | Status: DC

## 2021-01-20 MED ORDER — FLUTICASONE PROPIONATE 50 MCG/ACT NA SUSP: 2.0000 | Freq: Every day | NASAL | 0 refills | Status: DC

## 2021-01-20 MED ORDER — LEVOCETIRIZINE DIHYDROCHLORIDE 5 MG PO TABS: 5.0000 mg | ORAL_TABLET | Freq: Every evening | ORAL | 1 refills | Status: DC

## 2021-01-20 MED ORDER — ROSUVASTATIN CALCIUM 10 MG PO TABS: 10.0000 mg | ORAL_TABLET | Freq: Every day | ORAL | 1 refills | Status: DC

## 2021-01-20 NOTE — Progress Notes (Signed)
Name: Morgan Peterson   MRN: [id number]    DOB: 12-16-1951   Date:01/20/2021       Progress Note  Subjective  Chief Complaint  Follow up  HPI  Hives:back in Oct 2020 she developed hives after eating , it happened two more times and she came in to see me. She had a lab test that showed a lot of food allergies, she saw allergies and patch test was negative. She has been avoiding gluten, milk , egg white, peanut , shrimp, scallop, sesame seed.  She is still following the diet, too afraid to having hives again. She needs a refill of Epipen   HTN: bp was elevated when she came in but improved when she rested. She has been taking medication as prescribedAtenolol and Triamterene HCTZ,denies chest pain or dizziness but she has palpitation when tiredor stressed out  Allergic to ACE - angioedema.  [id number] seeingEndocrinologist Dr. Honor Junes , last A1C was at goal at 6.7 % back in 07/2020, urine micro negative and LDL was 77 and we adjusted dose of Crestor , we will recheck it today  . She used to seeDr. Abbisogunbecause hgbA1C 13% April 2017 she wasShewas onVictoza and Lantus but she was not compliant with her diet at the time, but now on Ozempic, she has not been very compliant with her diet lately, working long hours and eating more pretzels, subs, she has been drinking sodas again  . No polyphagia, no polydipsia or polyuria  Migraine headaches:she states no recent episodes . 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 Manfred Shirts, last TSH normal   Hiatal hernia: she had one episode recently of indigestion and heaviness in her  chest when she ate and went to bed, symptoms resolved by sitting up, no associated with nausea, diaphoresis or SOB.  Laceration foot: went to get a pedicure and heel got scrapped, she also injured her left toe nail this am, hit the corner of a chair and broke her nail. She is due for Tdap   Intermittent low back pain: dull aching, sometimes burning, no radiculitis, she would like to see chiropractor first   Patient Active Problem List   Diagnosis Date Noted  . Hypertension associated with type 2 diabetes mellitus ([place]) 01/20/2021  . Chronic low back pain without sciatica 08/18/2017  . Irritable bowel syndrome with constipation 05/02/2017  . Vitamin D deficiency 09/09/2016  . Hiatal hernia 06/02/2016  . Allergic rhinitis, seasonal 06/02/2016  . Obesity (BMI 30-39.9) 04/30/2016  . Angio-edema 04/30/2016  . Hyperlipidemia 11/04/2015  . Hyperlipidemia due to type 2 diabetes mellitus ([place]) 11/04/2015  . Cervical radiculopathy at C6 07/31/2015  . Neuroma digital nerve 07/31/2015  . Type 2 diabetes mellitus with hyperglycemia, with long-term current use of insulin ([place]) 06/30/2015  . Right shoulder pain 06/30/2015  . Cervical disc disease 06/30/2015  . Essential hypertension 06/30/2015  . History of breast cancer in female 03/23/2009    Past Surgical History:  Procedure Laterality Date  . ABDOMINAL EXPLORATION SURGERY  2000  . BREAST BIOPSY Right 2010   +   . BREAST EXCISIONAL BIOPSY Right 2010   + mammo  site [facility]  . BREAST LUMPECTOMY Right 2010   [place]  . COLONOSCOPY  07/19/2012   Normal exam, Dr. Bary Castilla  . TUBAL LIGATION  20 years ago    Family History  Problem Relation Age of Onset  . Hypertension Mother   . Hyperlipidemia Mother   . Diabetes Father   . Kidney disease Father   . Diabetes Brother   . Colon cancer Maternal Uncle   . Breast cancer Maternal Aunt     Social History   Tobacco Use  . Smoking status: Former Smoker    Packs/day: 0.25    Years:  10.00    Pack years: 2.50    Types: Cigarettes    Start date: 80    Quit date: 1988    Years since quitting: 34.1  . Smokeless tobacco: Never Used  . Tobacco comment: smoking cessation materials not required  Substance Use Topics  . Alcohol use: Yes    Alcohol/week: 0.0 standard drinks    Comment: wine     Current Outpatient Medications:  .  aspirin EC 81 MG tablet, Take 1 tablet (81 mg total) by mouth daily., Disp: 30 tablet, Rfl: 0 .  atenolol (TENORMIN) 25 MG tablet, Take 1 tablet (25 mg total) by mouth daily., Disp: 90 tablet, Rfl: 1 .  Blood Glucose Monitoring Suppl (ONETOUCH VERIO) w/Device KIT, 1 Device by Does not apply route once., Disp: 1 kit, Rfl: 0 .  Calcium Carb-Cholecalciferol 200-10 MG-MCG CAPS, Take by mouth., Disp: , Rfl:  .  cholecalciferol (VITAMIN D) 1000 UNITS tablet, Take 1,000 Units by mouth daily., Disp: , Rfl:  .  EPINEPHrine (EPIPEN 2-PAK) 0.3 mg/0.3 mL IJ SOAJ injection, Inject 0.3 mLs (0.3 mg total) into the muscle as needed for anaphylaxis., Disp: 2 each, Rfl: 0 .  famotidine (PEPCID) 40 MG tablet, Take 1 tablet (40 mg total) by mouth 2 (two) times daily., Disp: 180 tablet, Rfl: 1 .  fluticasone (FLONASE) 50 MCG/ACT nasal spray, SPRAY 2 SPRAYS INTO EACH NOSTRIL EVERY DAY, Disp: 48 mL, Rfl: 0 .  glucose blood (ONETOUCH VERIO) test strip, Use as instructed, Disp: 100 each, Rfl: 12 .  Lancets (ONETOUCH ULTRASOFT) lancets, Use as instructed, Disp: 100 each, Rfl: 3 .  levocetirizine (XYZAL) 5 MG tablet, Take 1 tablet (5 mg total) by mouth every evening., Disp: 90 tablet, Rfl: 1 .  montelukast (SINGULAIR) 10 MG tablet, Take 1 tablet (10 mg total) by mouth at bedtime., Disp: 90 tablet, Rfl: 1 .  Multiple Vitamin (MULTIVITAMIN) LIQD, Take 5 mLs by mouth daily., Disp: , Rfl:  .  NON FORMULARY, CBD oil, patients takes a few drops each morning, Disp: , Rfl:  .  OZEMPIC, 0.25 OR 0.5 MG/DOSE, 2 MG/1.5ML SOPN, Inject 0.5 mg into the skin once a week., Disp: , Rfl:  .   rosuvastatin (CRESTOR) 5 MG tablet, Take 5 mg by mouth daily., Disp: , Rfl:  .  triamterene-hydrochlorothiazide (MAXZIDE-25) 37.5-25 MG tablet, Take 1 tablet by mouth daily., Disp: 90 tablet, Rfl: 1 .  vitamin B-12 (CYANOCOBALAMIN) 1000 MCG tablet, Take 1 tablet by mouth 3 (three) times a week. , Disp: , Rfl:  .  ZINC-VITAMIN C PO, Take 1 tablet by mouth daily., Disp: , Rfl:   Allergies  Allergen Reactions  . Levaquin [Levofloxacin In D5w] Swelling    Other reaction(s): Arthralgia (Joint Pain)  . Losartan     Other reaction(s): Angioedema  . Amlodipine-Olmesartan   . Egg Donia Pounds, Egg]   .  Gabapentin     cns side effects.  . Glipizide     Other reaction(s): Abdominal pain  . Gluten Meal   . Invokana [Canagliflozin] Other (See Comments)    Bladder pain  . Lisinopril Swelling    Angioedema  . Metformin And Related Nausea Only  . Milk-Related Compounds   . Onglyza [Saxagliptin] Other (See Comments)    Increased PVC's  . Shrimp [Shellfish Allergy]   . Tramadol Nausea And Vomiting  . Actos [Pioglitazone] Other (See Comments) and Nausea And Vomiting    Bladder pain  . Atorvastatin Other (See Comments)  . Carvedilol Other (See Comments)    NUMBNESS, TINGLING, ACHING IN ARMS  . Prednisone Palpitations    I personally reviewed active problem list, medication list, allergies, family history, social history, health maintenance with the patient/caregiver today.   ROS  Constitutional: Negative for fever or weight change.  Respiratory: Negative for cough and shortness of breath.   Cardiovascular: Negative for chest pain or palpitations.  Gastrointestinal: Negative for abdominal pain, no bowel changes.  Musculoskeletal: Negative for gait problem or joint swelling.  Skin: Negative for rash.  Neurological: Negative for dizziness , positive for intermittent headache.  No other specific complaints in a complete review of systems (except as listed in HPI  above).  Objective  Vitals:   01/20/21 0932 01/20/21 0936  BP: (!) 160/80 130/80  Pulse: 70   Resp: 16   Temp: 98 F (36.7 C)   TempSrc: Oral   SpO2: 99%   Weight: 206 lb 9.6 oz (93.7 kg)   Height: _0  (1.676 m)     Body mass index is 33.35 kg/m.  Physical Exam  Constitutional: Patient appears well-developed and well-nourished. Obese  No distress.  HEENT: head atraumatic, normocephalic, pupils equal and reactive to light,  neck supple Cardiovascular: Normal rate, regular rhythm and normal heart sounds.  No murmur heard. No BLE edema. Pulmonary/Chest: Effort normal and breath sounds normal. No respiratory distress. Abdominal: Soft.  There is no tenderness. Skin: excoriation of left heel  Psychiatric: Patient has a normal mood and affect. behavior is normal. Judgment and thought content normal.  Diabetic Foot Exam: Diabetic Foot Exam - Simple   Simple Foot Form Diabetic Foot exam was performed with the following findings: Yes 01/20/2021 10:17 AM  Visual Inspection See comments: Yes Sensation Testing Intact to touch and monofilament testing bilaterally: Yes Pulse Check Posterior Tibialis and Dorsalis pulse intact bilaterally: Yes Comments Excoriation left heel, broken 1st left toe nail, some corn formation       PHQ2/9: Depression screen [facility] 2/9 01/20/2021 12/23/2020 09/18/2020 03/18/2020 11/06/2019  Decreased Interest 0 0 0 0 0  Down, Depressed, Hopeless 0 0 0 0 0  PHQ - 2 Score 0 0 0 0 0  Altered sleeping - - - 0 0  Tired, decreased energy - - - 2 0  Change in appetite - - - 0 0  Feeling bad or failure about yourself  - - - 0 0  Trouble concentrating - - - 0 0  Moving slowly or fidgety/restless - - - 0 0  Suicidal thoughts - - - 0 0  PHQ-9 Score - - - 2 0  Difficult doing work/chores - - - Not difficult at all -  Some recent data might be hidden    phq 9 is negative   Fall Risk: Fall Risk  01/20/2021 12/23/2020 09/18/2020 03/18/2020 11/06/2019  Falls in the past  year? 0 0 0 0 0  Number falls in past yr: 0 0 0 0 0  Injury with Fall? 0 0 0 0 0  Risk for fall due to : - No Fall Risks - - -  Follow up - Falls prevention discussed - - -     Functional Status Survey: Is the patient deaf or have difficulty hearing?: No Does the patient have difficulty seeing, even when wearing glasses/contacts?: No Does the patient have difficulty concentrating, remembering, or making decisions?: No Does the patient have difficulty walking or climbing stairs?: No Does the patient have difficulty dressing or bathing?: No Does the patient have difficulty doing errands alone such as visiting a doctor's office or shopping?: No    Assessment & Plan   1. Geographic tongue  Avoid citric food, spicy meals and try switching to [employer] toothpaste  2. Essential hypertension  - atenolol (TENORMIN) 25 MG tablet; Take 1 tablet (25 mg total) by mouth daily.  Dispense: 90 tablet; Refill: 1  3. Vitamin D deficiency   4. Dyslipidemia associated with type 2 diabetes mellitus ([facility])  - rosuvastatin (CRESTOR) 10 MG tablet; Take 1 tablet (10 mg total) by mouth daily.  Dispense: 90 tablet; Refill: 1  5. Angioedema of lips, subsequent encounter   6. Food allergy   7. Hiatal hernia   8. Hypertension associated with type 2 diabetes mellitus ([place])  Continue medications  9. Laceration of left foot, initial encounter   10. Hives  - EPINEPHrine (EPIPEN 2-PAK) 0.3 mg/0.3 mL IJ SOAJ injection; Inject 0.3 mg into the muscle as needed for anaphylaxis.  Dispense: 2 each; Refill: 0 - famotidine (PEPCID) 40 MG tablet; Take 1 tablet (40 mg total) by mouth 2 (two) times daily.  Dispense: 180 tablet; Refill: 1 - montelukast (SINGULAIR) 10 MG tablet; Take 1 tablet (10 mg total) by mouth at bedtime.  Dispense: 90 tablet; Refill: 1  11. Seasonal allergic rhinitis, unspecified trigger  - fluticasone (FLONASE) 50 MCG/ACT nasal spray; Place 2 sprays into both nostrils daily.  Dispense:  48 mL; Refill: 0 - montelukast (SINGULAIR) 10 MG tablet; Take 1 tablet (10 mg total) by mouth at bedtime.  Dispense: 90 tablet; Refill: 1  12. Need for Tdap vaccination  - Tdap vaccine greater than or equal to 7yo IM

## 2021-01-30 ENCOUNTER — Other Ambulatory Visit: Payer: Self-pay | Admitting: General Surgery

## 2021-01-30 DIAGNOSIS — Z853 Personal history of malignant neoplasm of breast: Secondary | ICD-10-CM

## 2021-02-19 DIAGNOSIS — I152 Hypertension secondary to endocrine disorders: Secondary | ICD-10-CM | POA: Diagnosis not present

## 2021-02-19 DIAGNOSIS — E1159 Type 2 diabetes mellitus with other circulatory complications: Secondary | ICD-10-CM | POA: Diagnosis not present

## 2021-02-19 DIAGNOSIS — E1169 Type 2 diabetes mellitus with other specified complication: Secondary | ICD-10-CM | POA: Diagnosis not present

## 2021-02-19 DIAGNOSIS — Z794 Long term (current) use of insulin: Secondary | ICD-10-CM | POA: Diagnosis not present

## 2021-02-19 DIAGNOSIS — E785 Hyperlipidemia, unspecified: Secondary | ICD-10-CM | POA: Diagnosis not present

## 2021-02-19 LAB — HEMOGLOBIN A1C: Hemoglobin A1C: 6.6

## 2021-02-20 DIAGNOSIS — J301 Allergic rhinitis due to pollen: Secondary | ICD-10-CM | POA: Diagnosis not present

## 2021-02-20 DIAGNOSIS — G501 Atypical facial pain: Secondary | ICD-10-CM | POA: Diagnosis not present

## 2021-02-20 DIAGNOSIS — R519 Headache, unspecified: Secondary | ICD-10-CM | POA: Diagnosis not present

## 2021-03-13 ENCOUNTER — Ambulatory Visit
Admission: RE | Admit: 2021-03-13 | Discharge: 2021-03-13 | Disposition: A | Discharge status: Home / Self Care | Payer: Medicare Other | Source: Ambulatory Visit | Attending: General Surgery | Admitting: General Surgery

## 2021-03-13 ENCOUNTER — Other Ambulatory Visit: Payer: Self-pay

## 2021-03-13 DIAGNOSIS — Z853 Personal history of malignant neoplasm of breast: Secondary | ICD-10-CM | POA: Diagnosis not present

## 2021-03-13 DIAGNOSIS — Z1231 Encounter for screening mammogram for malignant neoplasm of breast: Secondary | ICD-10-CM | POA: Diagnosis not present

## 2021-04-08 ENCOUNTER — Other Ambulatory Visit: Payer: Self-pay | Admitting: Family Medicine

## 2021-04-08 DIAGNOSIS — I10 Essential (primary) hypertension: Secondary | ICD-10-CM

## 2021-04-08 NOTE — Telephone Encounter (Signed)
Requested Prescriptions  Pending Prescriptions Disp Refills  . triamterene-hydrochlorothiazide (MAXZIDE-25) 37.5-25 MG tablet [Pharmacy Med Name: TRIAMTERENE-HCTZ 37.5-25 MG TB] 90 tablet 0    Sig: TAKE 1 TABLET BY MOUTH EVERY DAY     Cardiovascular: Diuretic Combos Failed - 04/08/2021  1:31 AM      Failed - K in normal range and within 360 days    Potassium  Date Value Ref Range Status  10/09/2019 4.3 3.5 - 5.3 mmol/L Final  03/24/2015 3.5 mmol/L Final    Comment:    3.5-5.1 NOTE: New Reference Range  02/25/15          Failed - Na in normal range and within 360 days    Sodium  Date Value Ref Range Status  10/09/2019 143 135 - 146 mmol/L Final  06/26/2019 141 137 - 147 Final  03/24/2015 137 mmol/L Final    Comment:    135-145 NOTE: New Reference Range  02/25/15          Failed - Cr in normal range and within 360 days    Creat  Date Value Ref Range Status  10/09/2019 0.82 0.50 - 0.99 mg/dL Final    Comment:    For patients >71 years of age, the reference limit for Creatinine is approximately 13% higher for people identified as African-American. .    Creatinine, Urine  Date Value Ref Range Status  01/30/2019 225 20 - 275 mg/dL Final         Failed - Ca in normal range and within 360 days    Calcium  Date Value Ref Range Status  10/09/2019 9.3 8.6 - 10.4 mg/dL Final   Calcium, Total  Date Value Ref Range Status  03/24/2015 8.7 (L) mg/dL Final    Comment:    8.9-10.3 NOTE: New Reference Range  02/25/15          Passed - Last BP in normal range    BP Readings from Last 1 Encounters:  01/20/21 130/80         Passed - Valid encounter within last 6 months    Recent Outpatient Visits          2 months ago Dyslipidemia associated with type 2 diabetes mellitus [facility])   [facility] Steele Sizer, MD   6 months ago Acute non-recurrent pansinusitis   [facility] Steele Sizer, MD   1 year ago Essential hypertension    [facility] Steele Sizer, MD   1 year ago Well adult exam   [facility] Steele Sizer, MD   1 year ago [facility] Steele Sizer, MD      Future Appointments            In 3 months Ancil Boozer, Drue Stager, MD [facility], [place]   In 8 months  [facility], [facility]

## 2021-04-15 ENCOUNTER — Ambulatory Visit: Payer: Medicare Other | Admitting: Family Medicine

## 2021-05-04 DIAGNOSIS — Z79899 Other long term (current) drug therapy: Secondary | ICD-10-CM | POA: Diagnosis not present

## 2021-05-04 DIAGNOSIS — E538 Deficiency of other specified B group vitamins: Secondary | ICD-10-CM | POA: Diagnosis not present

## 2021-05-04 DIAGNOSIS — G43809 Other migraine, not intractable, without status migrainosus: Secondary | ICD-10-CM | POA: Diagnosis not present

## 2021-05-08 ENCOUNTER — Other Ambulatory Visit: Payer: Self-pay | Admitting: Neurology

## 2021-05-08 ENCOUNTER — Telehealth: Payer: Self-pay

## 2021-05-08 DIAGNOSIS — G43809 Other migraine, not intractable, without status migrainosus: Secondary | ICD-10-CM

## 2021-05-08 NOTE — Telephone Encounter (Signed)
appt sch'd with Malachy Mood for 5.31.2022 she will go to [facility] urgent [facility] if it gets worse

## 2021-05-08 NOTE — Telephone Encounter (Signed)
Pt is experiencing back pain and wanted to know if she needs an appt or if you would call her in some ibuprofen to her local pharmacy. She stated the last time you did it made her back feel better.

## 2021-05-19 ENCOUNTER — Ambulatory Visit: Admit: 2021-05-19 | Payer: Medicare Other

## 2021-05-19 ENCOUNTER — Ambulatory Visit
Admission: RE | Admit: 2021-05-19 | Discharge: 2021-05-19 | Disposition: A | Discharge status: Home / Self Care | Payer: Medicare Other | Source: Ambulatory Visit | Attending: Unknown Physician Specialty | Admitting: Unknown Physician Specialty

## 2021-05-19 ENCOUNTER — Ambulatory Visit (INDEPENDENT_AMBULATORY_CARE_PROVIDER_SITE_OTHER): Payer: Medicare Other | Admitting: Unknown Physician Specialty

## 2021-05-19 ENCOUNTER — Encounter: Payer: Self-pay | Admitting: Unknown Physician Specialty

## 2021-05-19 ENCOUNTER — Ambulatory Visit
Admission: RE | Admit: 2021-05-19 | Discharge: 2021-05-19 | Disposition: A | Discharge status: Home / Self Care | Payer: Medicare Other | Attending: Unknown Physician Specialty | Admitting: Unknown Physician Specialty

## 2021-05-19 ENCOUNTER — Other Ambulatory Visit: Payer: Self-pay | Admitting: Unknown Physician Specialty

## 2021-05-19 ENCOUNTER — Other Ambulatory Visit: Payer: Self-pay

## 2021-05-19 VITALS — BP 122/78 | HR 64 | Temp 98.2°F | Resp 16 | Ht 66.0 in | Wt 203.5 lb

## 2021-05-19 DIAGNOSIS — M549 Dorsalgia, unspecified: Secondary | ICD-10-CM | POA: Insufficient documentation

## 2021-05-19 DIAGNOSIS — I6523 Occlusion and stenosis of bilateral carotid arteries: Secondary | ICD-10-CM

## 2021-05-19 DIAGNOSIS — M542 Cervicalgia: Secondary | ICD-10-CM

## 2021-05-19 DIAGNOSIS — M546 Pain in thoracic spine: Secondary | ICD-10-CM | POA: Diagnosis not present

## 2021-05-19 DIAGNOSIS — R222 Localized swelling, mass and lump, trunk: Secondary | ICD-10-CM | POA: Diagnosis not present

## 2021-05-19 DIAGNOSIS — M2578 Osteophyte, vertebrae: Secondary | ICD-10-CM | POA: Diagnosis not present

## 2021-05-19 MED ORDER — BACLOFEN 10 MG PO TABS: 10.0000 mg | ORAL_TABLET | Freq: Three times a day (TID) | ORAL | 0 refills | Status: DC

## 2021-05-19 NOTE — Progress Notes (Signed)
BP 122/78   Pulse 64   Temp 98.2 F (36.8 C)   Resp 16   Ht 5\' 6"  (1.676 m)   Wt 203 lb 8 oz (92.3 kg)   SpO2 99%   BMI 32.85 kg/m    Subjective:    Patient ID: Morgan Peterson, female    DOB: Mar 18, 1951, 70 y.o.   MRN: [id number]  HPI: Morgan Peterson is a 70 y.o. female  Chief Complaint  Patient presents with  . 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   She initially injured back 3 years putting things into trunk.  X-rays were negative at the time.  She works at a group home and re injured it 2 month ago restraining a client who weights over 300 pounds.  She is having to sit longer further aggravating pain.    Currently pain back of neck which radiates to the shoulder. Describes the pain as "spasms." Upper back is bothering her 'Some."  No arm or hand weakness or change in sensation.    Currently using heating pad, aspercream and extra strength Tylenol with very mild relief. Late afternoon and evening the pain is worse.     Relevant past medical, surgical, family and social history reviewed and updated as indicated. Interim medical history since our last visit reviewed. Allergies and medications reviewed and updated.   Per HPI unless specifically indicated above     Objective:    BP 122/78   Pulse 64   Temp 98.2 F (36.8 C)   Resp 16   Ht 5\' 6"  (1.676 m)   Wt 203 lb 8 oz (92.3 kg)   SpO2 99%   BMI 32.85 kg/m   Wt Readings from Last 3 Encounters:  05/19/21 203 lb 8 oz (92.3 kg)  01/20/21 206 lb 9.6 oz (93.7 kg)  09/18/20 202 lb (91.6 kg)    Physical Exam Constitutional:      General: She is not in acute distress.    Appearance: Normal appearance. She is well-developed.  HENT:     Head: Normocephalic and atraumatic.  Eyes:     General: Lids are normal. No scleral icterus.       Right eye: No discharge.        Left eye: No discharge.     Conjunctiva/sclera:  Conjunctivae normal.  Neck:     Comments: Increased pain with right rotation and right lateral bending.   Cardiovascular:     Rate and Rhythm: Normal rate.  Pulmonary:     Effort: Pulmonary effort is normal.  Abdominal:     Palpations: There is no hepatomegaly or splenomegaly.  Musculoskeletal:        General: Normal range of motion.     Cervical back: Pain with movement present.  Skin:    Coloration: Skin is not pale.     Findings: No rash.  Neurological:     Mental Status: She is alert and oriented to person, place, and time.  Psychiatric:        Behavior: Behavior normal.        Thought Content: Thought content normal.        Judgment: Judgment normal.     Results for orders placed or performed in visit on 01/20/21  Microalbumin, urine  Result Value Ref Range   Microalb, Ur Negative   VITAMIN D 25 Hydroxy (Vit-D Deficiency, Fractures)  Result Value Ref Range   Vit D, 25-Hydroxy 50   Lipid panel  Result Value Ref Range   Triglycerides 80 40 - 160   Cholesterol 141 0 - 200   HDL 48 35 - 70  TSH  Result Value Ref Range   TSH 1.44 0.41 - 5.90      Assessment & Plan:   Problem List Items Addressed This Visit   None   Visit Diagnoses    Neck pain    -  Primary   Acute exacerbation.  Will get x-ray.  Rx for Baclofen, caution drowsiness.  Refer to PT   Relevant Orders   DG Cervical Spine Complete   Ambulatory referral to Physical Therapy   Upper back pain       chronic with acute exacerbation.  Continue with home therapy of heat, aspercream, and tylenol.  X-ray and refer to PT   Relevant Medications   baclofen (LIORESAL) 10 MG tablet   Other Relevant Orders   DG Thoracic Spine W/Swimmers   Ambulatory referral to Physical Therapy       Follow up plan: Return if symptoms worsen or fail to improve.

## 2021-05-20 ENCOUNTER — Ambulatory Visit
Admission: RE | Admit: 2021-05-20 | Discharge: 2021-05-20 | Disposition: A | Discharge status: Home / Self Care | Payer: Medicare Other | Source: Ambulatory Visit | Attending: Neurology | Admitting: Neurology

## 2021-05-20 DIAGNOSIS — G43809 Other migraine, not intractable, without status migrainosus: Secondary | ICD-10-CM

## 2021-06-02 ENCOUNTER — Other Ambulatory Visit: Payer: Self-pay

## 2021-06-02 ENCOUNTER — Ambulatory Visit
Admission: RE | Admit: 2021-06-02 | Discharge: 2021-06-02 | Disposition: A | Discharge status: Home / Self Care | Payer: Medicare Other | Source: Ambulatory Visit | Attending: Neurology | Admitting: Neurology

## 2021-06-02 DIAGNOSIS — G43809 Other migraine, not intractable, without status migrainosus: Secondary | ICD-10-CM | POA: Insufficient documentation

## 2021-06-02 DIAGNOSIS — G43909 Migraine, unspecified, not intractable, without status migrainosus: Secondary | ICD-10-CM | POA: Diagnosis not present

## 2021-06-09 DIAGNOSIS — M461 Sacroiliitis, not elsewhere classified: Secondary | ICD-10-CM | POA: Diagnosis not present

## 2021-06-09 DIAGNOSIS — M9903 Segmental and somatic dysfunction of lumbar region: Secondary | ICD-10-CM | POA: Diagnosis not present

## 2021-06-09 DIAGNOSIS — M9901 Segmental and somatic dysfunction of cervical region: Secondary | ICD-10-CM | POA: Diagnosis not present

## 2021-06-09 DIAGNOSIS — M5413 Radiculopathy, cervicothoracic region: Secondary | ICD-10-CM | POA: Diagnosis not present

## 2021-06-09 DIAGNOSIS — M9904 Segmental and somatic dysfunction of sacral region: Secondary | ICD-10-CM | POA: Diagnosis not present

## 2021-06-09 DIAGNOSIS — M5442 Lumbago with sciatica, left side: Secondary | ICD-10-CM | POA: Diagnosis not present

## 2021-06-11 DIAGNOSIS — M5442 Lumbago with sciatica, left side: Secondary | ICD-10-CM | POA: Diagnosis not present

## 2021-06-11 DIAGNOSIS — M9901 Segmental and somatic dysfunction of cervical region: Secondary | ICD-10-CM | POA: Diagnosis not present

## 2021-06-11 DIAGNOSIS — M9903 Segmental and somatic dysfunction of lumbar region: Secondary | ICD-10-CM | POA: Diagnosis not present

## 2021-06-11 DIAGNOSIS — M5413 Radiculopathy, cervicothoracic region: Secondary | ICD-10-CM | POA: Diagnosis not present

## 2021-06-11 DIAGNOSIS — M9904 Segmental and somatic dysfunction of sacral region: Secondary | ICD-10-CM | POA: Diagnosis not present

## 2021-06-30 DIAGNOSIS — M9903 Segmental and somatic dysfunction of lumbar region: Secondary | ICD-10-CM | POA: Diagnosis not present

## 2021-06-30 DIAGNOSIS — M5413 Radiculopathy, cervicothoracic region: Secondary | ICD-10-CM | POA: Diagnosis not present

## 2021-06-30 DIAGNOSIS — M461 Sacroiliitis, not elsewhere classified: Secondary | ICD-10-CM | POA: Diagnosis not present

## 2021-06-30 DIAGNOSIS — M9901 Segmental and somatic dysfunction of cervical region: Secondary | ICD-10-CM | POA: Diagnosis not present

## 2021-06-30 DIAGNOSIS — M9904 Segmental and somatic dysfunction of sacral region: Secondary | ICD-10-CM | POA: Diagnosis not present

## 2021-06-30 DIAGNOSIS — M5442 Lumbago with sciatica, left side: Secondary | ICD-10-CM | POA: Diagnosis not present

## 2021-07-12 ENCOUNTER — Other Ambulatory Visit: Payer: Self-pay | Admitting: Family Medicine

## 2021-07-12 DIAGNOSIS — I10 Essential (primary) hypertension: Secondary | ICD-10-CM

## 2021-07-12 NOTE — Telephone Encounter (Signed)
Requested medication (s) are due for refill today: yes  Requested medication (s) are on the active medication list: yes  Last refill:  04/08/21 #90   Future visit scheduled: yes  Notes to clinic:  overdue lab work- pt has upcoming appt   Requested Prescriptions  Pending Prescriptions Disp Refills   triamterene-hydrochlorothiazide (MAXZIDE-25) 37.5-25 MG tablet [Pharmacy Med Name: TRIAMTERENE-HCTZ 37.5-25 MG TB] 90 tablet 0    Sig: TAKE 1 TABLET BY MOUTH EVERY DAY      Cardiovascular: Diuretic Combos Failed - 07/12/2021  1:01 AM      Failed - K in normal range and within 360 days    Potassium  Date Value Ref Range Status  10/09/2019 4.3 3.5 - 5.3 mmol/L Final  03/24/2015 3.5 mmol/L Final    Comment:    3.5-5.1 NOTE: New Reference Range  02/25/15           Failed - Na in normal range and within 360 days    Sodium  Date Value Ref Range Status  10/09/2019 143 135 - 146 mmol/L Final  06/26/2019 141 137 - 147 Final  03/24/2015 137 mmol/L Final    Comment:    135-145 NOTE: New Reference Range  02/25/15           Failed - Cr in normal range and within 360 days    Creat  Date Value Ref Range Status  10/09/2019 0.82 0.50 - 0.99 mg/dL Final    Comment:    For patients >41 years of age, the reference limit for Creatinine is approximately 13% higher for people identified as African-American. .    Creatinine, Urine  Date Value Ref Range Status  01/30/2019 225 20 - 275 mg/dL Final          Failed - Ca in normal range and within 360 days    Calcium  Date Value Ref Range Status  10/09/2019 9.3 8.6 - 10.4 mg/dL Final   Calcium, Total  Date Value Ref Range Status  03/24/2015 8.7 (L) mg/dL Final    Comment:    8.9-10.3 NOTE: New Reference Range  02/25/15           Passed - Last BP in normal range    BP Readings from Last 1 Encounters:  05/19/21 122/78          Passed - Valid encounter within last 6 months    Recent Outpatient Visits           1 month  ago Neck pain   [place], NP   5 months ago Dyslipidemia associated with type 2 diabetes mellitus [facility])   [facility] Steele Sizer, MD   9 months ago Acute non-recurrent pansinusitis   [facility] Steele Sizer, MD   1 year ago Essential hypertension   [facility] Steele Sizer, MD   1 year ago Well adult exam   [facility] Steele Sizer, MD       Future Appointments             In 1 week Steele Sizer, MD [facility], [place]   In 5 months  [facility]

## 2021-07-13 NOTE — Telephone Encounter (Signed)
Next appt is 07/20/21

## 2021-07-17 DIAGNOSIS — L659 Nonscarring hair loss, unspecified: Secondary | ICD-10-CM | POA: Diagnosis not present

## 2021-07-17 DIAGNOSIS — L668 Other cicatricial alopecia: Secondary | ICD-10-CM | POA: Diagnosis not present

## 2021-07-17 NOTE — Progress Notes (Deleted)
Name: Morgan Peterson   MRN: [id number]    DOB: 03-Jul-1951   Date:07/17/2021       Progress Note  Subjective  Chief Complaint  Follow Up  HPI  Hives: back in Oct 2020 she developed hives after eating , it happened two more times and she came in to see me. She had a lab test that showed a lot of food allergies, she saw allergies and patch test was negative. She has been avoiding gluten, milk , egg white, peanut , shrimp, scallop, sesame seed .  She is still following the diet, too afraid to having hives again. She needs a refill of Epipen    HTN: bp was elevated when she came in but improved when she rested.  She has been taking medication as prescribed Atenolol and Triamterene HCTZ,  denies chest pain or dizziness  but she has palpitation when tired or stressed out  Allergic to ACE - angioedema.    DM: she is seeing  Endocrinologist Dr. Honor Junes , last A1C was at goal at 6.7 % back in 07/2020, urine micro negative and LDL was 77 and we adjusted dose of Crestor , we will recheck it today  . She used to see  Dr. Elsie Stain because hgbA1C 13% April 2017 she was She was on Victoza and Lantus but she was not compliant with her diet at the time, but now on Ozempic, she has not been very compliant with her diet lately, working long hours and eating more pretzels, subs, she has been drinking sodas again  .  No polyphagia, no polydipsia or polyuria   Migraine headaches: she states no recent episodes . Unchanged    IBS constipation type: she has a long history of IBS constipation. She states her bowel movements are better since she has been eating oatmeal and more fiber in her diet, taking miralax prn, has bowel movements almost daily now and no pain  Geographic tongue: she has noticed pain when eating citric foods, spicy foods and also when brushing her teeth   Thyroid nodule: monitored by Dr. Manfred Shirts, last TSH normal   Hiatal hernia: she had one episode recently of indigestion and heaviness in her  chest when she ate and went to bed, symptoms resolved by sitting up, no associated with nausea, diaphoresis or SOB.  Laceration foot: went to get a pedicure and heel got scrapped, she also injured her left toe nail this am, hit the corner of a chair and broke her nail. She is due for Tdap   Intermittent low back pain: dull aching, sometimes burning, no radiculitis, she would like to see chiropractor first   Patient Active Problem List   Diagnosis Date Noted   Hypertension associated with type 2 diabetes mellitus ([place]) 01/20/2021   Chronic low back pain without sciatica 08/18/2017   Irritable bowel syndrome with constipation 05/02/2017   Vitamin D deficiency 09/09/2016   Hiatal hernia 06/02/2016   Allergic rhinitis, seasonal 06/02/2016   Obesity (BMI 30-39.9) 04/30/2016   Angio-edema 04/30/2016   Hyperlipidemia 11/04/2015   Hyperlipidemia due to type 2 diabetes mellitus ([place]) 11/04/2015   Cervical radiculopathy at C6 07/31/2015   Neuroma digital nerve 07/31/2015   Type 2 diabetes mellitus with hyperglycemia, with long-term current use of insulin ([place]) 06/30/2015   Right shoulder pain 06/30/2015   Cervical disc disease 06/30/2015   Essential hypertension 06/30/2015   History of breast cancer in female 03/23/2009    Past Surgical History:  Procedure Laterality Date  ABDOMINAL EXPLORATION SURGERY  2000   BREAST BIOPSY Right 2010   +    BREAST EXCISIONAL BIOPSY Right 2010   + mammo site inasive mammo ca   BREAST LUMPECTOMY Right 2010   [facility]   COLONOSCOPY  07/19/2012   Normal exam, Dr. Bary Castilla   TUBAL LIGATION  20 years ago    Family History  Problem Relation Age of Onset   Hypertension Mother    Hyperlipidemia Mother    Diabetes Father    Kidney disease Father    Diabetes Brother    Colon cancer Maternal Uncle    Breast cancer Maternal Aunt     Social History   Tobacco Use   Smoking status: Former    Packs/day: 0.25    Years: 10.00    Pack years: 2.50    Types:  Cigarettes    Start date: 57    Quit date: 1988    Years since quitting: 34.5   Smokeless tobacco: Never   Tobacco comments:    smoking cessation materials not required  Substance Use Topics   Alcohol use: Yes    Alcohol/week: 0.0 standard drinks    Comment: wine     Current Outpatient Medications:    triamterene-hydrochlorothiazide (MAXZIDE-25) 37.5-25 MG tablet, TAKE 1 TABLET BY MOUTH EVERY DAY, Disp: 90 tablet, Rfl: 0   aspirin EC 81 MG tablet, Take 1 tablet (81 mg total) by mouth daily., Disp: 30 tablet, Rfl: 0   atenolol (TENORMIN) 25 MG tablet, Take 1 tablet (25 mg total) by mouth daily., Disp: 90 tablet, Rfl: 1   baclofen (LIORESAL) 10 MG tablet, Take 1 tablet (10 mg total) by mouth 3 (three) times daily., Disp: 30 each, Rfl: 0   Blood Glucose Monitoring Suppl (ONETOUCH VERIO) w/Device KIT, 1 Device by Does not apply route once., Disp: 1 kit, Rfl: 0   Calcium Carb-Cholecalciferol 200-10 MG-MCG CAPS, Take by mouth., Disp: , Rfl:    cholecalciferol (VITAMIN D) 1000 UNITS tablet, Take 1,000 Units by mouth daily., Disp: , Rfl:    EPINEPHrine (EPIPEN 2-PAK) 0.3 mg/0.3 mL IJ SOAJ injection, Inject 0.3 mg into the muscle as needed for anaphylaxis., Disp: 2 each, Rfl: 0   famotidine (PEPCID) 40 MG tablet, Take 1 tablet (40 mg total) by mouth 2 (two) times daily., Disp: 180 tablet, Rfl: 1   fluticasone (FLONASE) 50 MCG/ACT nasal spray, Place 2 sprays into both nostrils daily., Disp: 48 mL, Rfl: 0   glucose blood (ONETOUCH VERIO) test strip, Use as instructed, Disp: 100 each, Rfl: 12   Lancets (ONETOUCH ULTRASOFT) lancets, Use as instructed, Disp: 100 each, Rfl: 3   levocetirizine (XYZAL) 5 MG tablet, Take 1 tablet (5 mg total) by mouth every evening., Disp: 90 tablet, Rfl: 1   montelukast (SINGULAIR) 10 MG tablet, Take 1 tablet (10 mg total) by mouth at bedtime., Disp: 90 tablet, Rfl: 1   Multiple Vitamin (MULTIVITAMIN) LIQD, Take 5 mLs by mouth daily., Disp: , Rfl:    NON FORMULARY,  CBD oil, patients takes a few drops each morning, Disp: , Rfl:    OZEMPIC, 0.25 OR 0.5 MG/DOSE, 2 MG/1.5ML SOPN, Inject 0.5 mg into the skin once a week., Disp: , Rfl:    rosuvastatin (CRESTOR) 10 MG tablet, Take 1 tablet (10 mg total) by mouth daily., Disp: 90 tablet, Rfl: 1   vitamin B-12 (CYANOCOBALAMIN) 1000 MCG tablet, Take 1 tablet by mouth 3 (three) times a week. , Disp: , Rfl:    ZINC-VITAMIN C PO,  Take 1 tablet by mouth daily., Disp: , Rfl:   Allergies  Allergen Reactions   Levaquin [Levofloxacin In D5w] Swelling    Other reaction(s): Arthralgia (Joint Pain)   Losartan     Other reaction(s): Angioedema   Amlodipine-Olmesartan    Egg White [Albumen, Egg]    Gabapentin     cns side effects.   Glipizide     Other reaction(s): Abdominal pain   Gluten Meal    Invokana [Canagliflozin] Other (See Comments)    Bladder pain   Lisinopril Swelling    Angioedema   Metformin And Related Nausea Only   Milk-Related Compounds    Onglyza [Saxagliptin] Other (See Comments)    Increased PVC's   Shrimp [Shellfish Allergy]    Tramadol Nausea And Vomiting   Actos [Pioglitazone] Other (See Comments) and Nausea And Vomiting    Bladder pain   Atorvastatin Other (See Comments)   Carvedilol Other (See Comments)    NUMBNESS, TINGLING, ACHING IN ARMS   Prednisone Palpitations    I personally reviewed {Reviewed:14835} with the patient/caregiver today.   ROS  ***  Objective  There were no vitals filed for this visit.  There is no height or weight on file to calculate BMI.  Physical Exam ***  No results found for this or any previous visit (from the past 2160 hour(s)).  Diabetic Foot Exam: Diabetic Foot Exam - Simple   No data filed    ***  PHQ2/9: Depression screen [facility] 2/9 05/19/2021 01/20/2021 12/23/2020 09/18/2020 03/18/2020  Decreased Interest 0 0 0 0 0  Down, Depressed, Hopeless 0 0 0 0 0  PHQ - 2 Score 0 0 0 0 0  Altered sleeping 0 - - - 0  Tired, decreased energy 0 - - -  2  Change in appetite 0 - - - 0  Feeling bad or failure about yourself  0 - - - 0  Trouble concentrating 0 - - - 0  Moving slowly or fidgety/restless 0 - - - 0  Suicidal thoughts 0 - - - 0  PHQ-9 Score 0 - - - 2  Difficult doing work/chores Not difficult at all - - - Not difficult at all  Some recent data might be hidden    phq 9 is {gen pos OIZ:124580} ***  Fall Risk: Fall Risk  05/19/2021 01/20/2021 12/23/2020 09/18/2020 03/18/2020  Falls in the past year? 0 0 0 0 0  Number falls in past yr: 0 0 0 0 0  Injury with Fall? 0 0 0 0 0  Risk for fall due to : - - No Fall Risks - -  Follow up - - Falls prevention discussed - -   ***   Functional Status Survey:   ***   Assessment & Plan  *** There are no diagnoses linked to this encounter.

## 2021-07-20 ENCOUNTER — Ambulatory Visit: Payer: Medicare Other | Admitting: Family Medicine

## 2021-08-05 DIAGNOSIS — R208 Other disturbances of skin sensation: Secondary | ICD-10-CM | POA: Diagnosis not present

## 2021-08-05 DIAGNOSIS — L648 Other androgenic alopecia: Secondary | ICD-10-CM | POA: Diagnosis not present

## 2021-08-05 DIAGNOSIS — L668 Other cicatricial alopecia: Secondary | ICD-10-CM | POA: Diagnosis not present

## 2021-08-11 NOTE — Progress Notes (Signed)
Name: Morgan Peterson   MRN: [id number]    DOB: [birth year]   Date:08/12/2021       Progress Note  Subjective  Chief Complaint  Follow Up  HPI  Hives: back in Oct 2020 she developed hives after eating , it happened two more times and she came in to see me. She had a lab test that showed a lot of food allergies, she saw allergies and patch test was negative. She has been avoiding gluten, milk , egg white, peanut , shrimp, scallop, sesame seed .  She is still following the diet, too afraid to having hives again. She misses eating egg whites    HTN: She has been taking medication as prescribed Atenolol and Triamterene HCTZ,  denies chest pain or dizziness  but she has palpitation when tired or stressed out  Allergic to ACE - angioedema. BP at home has been well controlled    DM: she is seeing  Endocrinologist Dr. Honor Junes , last A1C was at goal at 6.6 . She has a follow up with him next month. Fsbs has been well controlled, this am 104 fasting. She has associated dyslipidemia and obesity. She is on Crestor 5 mg even though rx was for 10 mg, goal LDL is below 70. Denies polyphagia, polydipsia or polyuria    Migraine headaches: she was seen by Dr. Manuella Ghazi, had labs done in May, and MRI was unremarkable. 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 She states her bowel movements are better since she has been eating oatmeal and more fiber in her diet, taking miralax prn, she is taking Curerelle otc and is feeling well   Thyroid nodule: monitored by Dr. Manfred Shirts, last TSH normal , keep follow up with him   Intermittent low back pain: dull aching, sometimes burning, no radiculitis, she stopped baclofen now that she is seeing Dr. Rock Nephew chiropractor and is doing better   Patient Active Problem List   Diagnosis Date Noted   Hypertension associated with type 2 diabetes mellitus ([place]) 01/20/2021   Chronic low  back pain without sciatica 08/18/2017   Irritable bowel syndrome with constipation 05/02/2017   Vitamin D deficiency 09/09/2016   Hiatal hernia 06/02/2016   Allergic rhinitis, seasonal 06/02/2016   Obesity (BMI 30-39.9) 04/30/2016   Angio-edema 04/30/2016   Hyperlipidemia 11/04/2015   Hyperlipidemia due to type 2 diabetes mellitus ([place]) 11/04/2015   Cervical radiculopathy at C6 07/31/2015   Neuroma digital nerve 07/31/2015   Type 2 diabetes mellitus with hyperglycemia, with long-term current use of insulin ([place]) 06/30/2015   Right shoulder pain 06/30/2015   Cervical disc disease 06/30/2015   Essential hypertension 06/30/2015   History of breast cancer in female 03/23/2009    Past Surgical History:  Procedure Laterality Date   ABDOMINAL EXPLORATION SURGERY  2000   BREAST BIOPSY Right 2010   +    BREAST EXCISIONAL BIOPSY Right 2010   + mammo site inasive mammo ca   BREAST LUMPECTOMY Right 2010   [facility]   COLONOSCOPY  07/19/2012   Normal exam, Dr. Bary Castilla   TUBAL LIGATION  20 years ago    Family History  Problem Relation Age of Onset   Hypertension Mother    Hyperlipidemia Mother    Diabetes Father    Kidney disease Father    Diabetes Brother    Colon cancer Maternal Uncle    Breast cancer  Maternal Aunt     Social History   Tobacco Use   Smoking status: Former    Packs/day: 0.25    Years: 10.00    Pack years: 2.50    Types: Cigarettes    Start date: 76    Quit date: 1988    Years since quitting: 34.6   Smokeless tobacco: Never   Tobacco comments:    smoking cessation materials not required  Substance Use Topics   Alcohol use: Yes    Alcohol/week: 0.0 standard drinks    Comment: wine     Current Outpatient Medications:    aspirin EC 81 MG tablet, Take 1 tablet (81 mg total) by mouth daily., Disp: 30 tablet, Rfl: 0   atenolol (TENORMIN) 25 MG tablet, Take 1 tablet (25 mg total) by mouth daily., Disp: 90 tablet, Rfl: 1   Blood Glucose Monitoring Suppl  (ONETOUCH VERIO) w/Device KIT, 1 Device by Does not apply route once., Disp: 1 kit, Rfl: 0   cholecalciferol (VITAMIN D) 1000 UNITS tablet, Take 1,000 Units by mouth daily., Disp: , Rfl:    EPINEPHrine (EPIPEN 2-PAK) 0.3 mg/0.3 mL IJ SOAJ injection, Inject 0.3 mg into the muscle as needed for anaphylaxis., Disp: 2 each, Rfl: 0   famotidine (PEPCID) 40 MG tablet, Take 1 tablet (40 mg total) by mouth 2 (two) times daily., Disp: 180 tablet, Rfl: 1   glucose blood (ONETOUCH VERIO) test strip, Use as instructed, Disp: 100 each, Rfl: 12   Lancets (ONETOUCH ULTRASOFT) lancets, Use as instructed, Disp: 100 each, Rfl: 3   levocetirizine (XYZAL) 5 MG tablet, Take 1 tablet (5 mg total) by mouth every evening., Disp: 90 tablet, Rfl: 1   montelukast (SINGULAIR) 10 MG tablet, Take 1 tablet (10 mg total) by mouth at bedtime., Disp: 90 tablet, Rfl: 1   Multiple Vitamin (MULTIVITAMIN) LIQD, Take 5 mLs by mouth daily., Disp: , Rfl:    NON FORMULARY, CBD oil, patients takes a few drops each morning, Disp: , Rfl:    OZEMPIC, 0.25 OR 0.5 MG/DOSE, 2 MG/1.5ML SOPN, Inject 0.5 mg into the skin once a week., Disp: , Rfl:    rosuvastatin (CRESTOR) 10 MG tablet, Take 1 tablet (10 mg total) by mouth daily., Disp: 90 tablet, Rfl: 1   triamterene-hydrochlorothiazide (MAXZIDE-25) 37.5-25 MG tablet, TAKE 1 TABLET BY MOUTH EVERY DAY, Disp: 90 tablet, Rfl: 0   vitamin B-12 (CYANOCOBALAMIN) 1000 MCG tablet, Take 1 tablet by mouth 3 (three) times a week. , Disp: , Rfl:    ZINC-VITAMIN C PO, Take 1 tablet by mouth daily., Disp: , Rfl:    baclofen (LIORESAL) 10 MG tablet, Take 1 tablet (10 mg total) by mouth 3 (three) times daily. (Patient not taking: Reported on 08/12/2021), Disp: 30 each, Rfl: 0   Calcium Carb-Cholecalciferol 200-10 MG-MCG CAPS, Take by mouth. (Patient not taking: Reported on 08/12/2021), Disp: , Rfl:    fluticasone (FLONASE) 50 MCG/ACT nasal spray, Place 2 sprays into both nostrils daily. (Patient not taking: Reported  on 08/12/2021), Disp: 48 mL, Rfl: 0  Allergies  Allergen Reactions   Levaquin [Levofloxacin In D5w] Swelling    Other reaction(s): Arthralgia (Joint Pain)   Losartan     Other reaction(s): Angioedema   Amlodipine-Olmesartan    Egg White [Albumen, Egg]    Gabapentin     cns side effects.   Glipizide     Other reaction(s): Abdominal pain   Gluten Meal    Invokana [Canagliflozin] Other (See Comments)    Bladder  pain   Lisinopril Swelling    Angioedema   Metformin And Related Nausea Only   Milk-Related Compounds    Onglyza [Saxagliptin] Other (See Comments)    Increased PVC's   Shrimp [Shellfish Allergy]    Tramadol Nausea And Vomiting   Actos [Pioglitazone] Other (See Comments) and Nausea And Vomiting    Bladder pain   Atorvastatin Other (See Comments)   Carvedilol Other (See Comments)    NUMBNESS, TINGLING, ACHING IN ARMS   Prednisone Palpitations    I personally reviewed active problem list, medication list, allergies, family history, social history, health maintenance with the patient/caregiver today.   ROS  Constitutional: Negative for fever or weight change.  Respiratory: Negative for cough and shortness of breath.   Cardiovascular: Negative for chest pain or palpitations.  Gastrointestinal: Negative for abdominal pain, no bowel changes.  Musculoskeletal: Negative for gait problem or joint swelling.  Skin: Negative for rash.  Neurological: Negative for dizziness or headache.  No other specific complaints in a complete review of systems (except as listed in HPI above).   Objective  Vitals:   08/12/21 0900  BP: 116/70  Pulse: 69  Resp: 16  Temp: 98 F (36.7 C)  SpO2: 98%  Weight: 197 lb (89.4 kg)  Height: _0  (1.676 m)    Body mass index is 31.8 kg/m.  Physical Exam  Constitutional: Patient appears well-developed and well-nourished. Obese  No distress.  HEENT: head atraumatic, normocephalic, pupils equal and reactive to light, neck  supple Cardiovascular: Normal rate, regular rhythm and normal heart sounds.  No murmur heard. No BLE edema. Pulmonary/Chest: Effort normal and breath sounds normal. No respiratory distress. Abdominal: Soft.  There is no tenderness. Psychiatric: Patient has a normal mood and affect. behavior is normal. Judgment and thought content normal.   PHQ2/9: Depression screen [facility] 2/9 08/12/2021 05/19/2021 01/20/2021 12/23/2020 09/18/2020  Decreased Interest 0 0 0 0 0  Down, Depressed, Hopeless 0 0 0 0 0  PHQ - 2 Score 0 0 0 0 0  Altered sleeping - 0 - - -  Tired, decreased energy - 0 - - -  Change in appetite - 0 - - -  Feeling bad or failure about yourself  - 0 - - -  Trouble concentrating - 0 - - -  Moving slowly or fidgety/restless - 0 - - -  Suicidal thoughts - 0 - - -  PHQ-9 Score - 0 - - -  Difficult doing work/chores - Not difficult at all - - -  Some recent data might be hidden    phq 9 is negative   Fall Risk: Fall Risk  08/12/2021 05/19/2021 01/20/2021 12/23/2020 09/18/2020  Falls in the past year? 0 0 0 0 0  Number falls in past yr: 0 0 0 0 0  Injury with Fall? 0 0 0 0 0  Risk for fall due to : No Fall Risks - - No Fall Risks -  Follow up Falls prevention discussed - - Falls prevention discussed -     Functional Status Survey: Is the patient deaf or have difficulty hearing?: No Does the patient have difficulty seeing, even when wearing glasses/contacts?: No Does the patient have difficulty concentrating, remembering, or making decisions?: No Does the patient have difficulty walking or climbing stairs?: No Does the patient have difficulty dressing or bathing?: No Does the patient have difficulty doing errands alone such as visiting a doctor's office or shopping?: No    Assessment & Plan  1. Essential  hypertension  - atenolol (TENORMIN) 25 MG tablet; Take 1 tablet (25 mg total) by mouth daily.  Dispense: 90 tablet; Refill: 1 - triamterene-hydrochlorothiazide (MAXZIDE-25) 37.5-25 MG  tablet; Take 1 tablet by mouth daily.  Dispense: 90 tablet; Refill: 1 - COMPLETE METABOLIC PANEL WITH GFR  2. Hives  - montelukast (SINGULAIR) 10 MG tablet; Take 1 tablet (10 mg total) by mouth at bedtime.  Dispense: 90 tablet; Refill: 1  3. Seasonal allergic rhinitis, unspecified trigger  - levocetirizine (XYZAL) 5 MG tablet; Take 1 tablet (5 mg total) by mouth every evening.  Dispense: 90 tablet; Refill: 1 - montelukast (SINGULAIR) 10 MG tablet; Take 1 tablet (10 mg total) by mouth at bedtime.  Dispense: 90 tablet; Refill: 1  4. Dyslipidemia associated with type 2 diabetes mellitus ([facility])  - rosuvastatin (CRESTOR) 10 MG tablet; Take 1 tablet (10 mg total) by mouth daily.  Dispense: 90 tablet; Refill: 1 - Lipid panel  5. Vitamin D deficiency   6. Hypertension associated with type 2 diabetes mellitus ([place])   7. Intermittent low back pain

## 2021-08-12 ENCOUNTER — Encounter: Payer: Self-pay | Admitting: Family Medicine

## 2021-08-12 ENCOUNTER — Other Ambulatory Visit: Payer: Self-pay

## 2021-08-12 ENCOUNTER — Ambulatory Visit (INDEPENDENT_AMBULATORY_CARE_PROVIDER_SITE_OTHER): Payer: Medicare Other | Admitting: Family Medicine

## 2021-08-12 VITALS — BP 116/70 | HR 69 | Temp 98.0°F | Resp 16 | Ht 66.0 in | Wt 197.0 lb

## 2021-08-12 DIAGNOSIS — E1159 Type 2 diabetes mellitus with other circulatory complications: Secondary | ICD-10-CM | POA: Diagnosis not present

## 2021-08-12 DIAGNOSIS — E559 Vitamin D deficiency, unspecified: Secondary | ICD-10-CM | POA: Diagnosis not present

## 2021-08-12 DIAGNOSIS — E785 Hyperlipidemia, unspecified: Secondary | ICD-10-CM | POA: Diagnosis not present

## 2021-08-12 DIAGNOSIS — M545 Low back pain, unspecified: Secondary | ICD-10-CM

## 2021-08-12 DIAGNOSIS — E1169 Type 2 diabetes mellitus with other specified complication: Secondary | ICD-10-CM

## 2021-08-12 DIAGNOSIS — J302 Other seasonal allergic rhinitis: Secondary | ICD-10-CM

## 2021-08-12 DIAGNOSIS — L509 Urticaria, unspecified: Secondary | ICD-10-CM

## 2021-08-12 DIAGNOSIS — I10 Essential (primary) hypertension: Secondary | ICD-10-CM | POA: Diagnosis not present

## 2021-08-12 DIAGNOSIS — Z23 Encounter for immunization: Secondary | ICD-10-CM | POA: Diagnosis not present

## 2021-08-12 DIAGNOSIS — I152 Hypertension secondary to endocrine disorders: Secondary | ICD-10-CM | POA: Diagnosis not present

## 2021-08-12 LAB — LIPID PANEL
Cholesterol: 139 mg/dL (ref <200)
HDL: 46 mg/dL — ABNORMAL LOW (ref >=50)
LDL Cholesterol (Calc): 78 mg/dL (calc)
Non-HDL Cholesterol (Calc): 93 mg/dL (calc) (ref <130)
Total CHOL/HDL Ratio: 3 (calc) (ref <5.0)
Triglycerides: 71 mg/dL (ref <150)

## 2021-08-12 LAB — COMPLETE METABOLIC PANEL WITH GFR
AG Ratio: 1.2 (calc) (ref 1.0–2.5)
ALT: 12 U/L (ref 6–29)
AST: 15 U/L (ref 10–35)
Albumin: 3.7 g/dL (ref 3.6–5.1)
Alkaline phosphatase (APISO): 91 U/L (ref 37–153)
BUN: 10 mg/dL (ref 7–25)
CO2: 31 mmol/L (ref 20–32)
Calcium: 8.9 mg/dL (ref 8.6–10.4)
Chloride: 104 mmol/L (ref 98–110)
Creat: 0.87 mg/dL (ref 0.50–1.05)
Globulin: 3.1 g/dL (calc) (ref 1.9–3.7)
Glucose, Bld: 94 mg/dL (ref 65–99)
Potassium: 3.7 mmol/L (ref 3.5–5.3)
Sodium: 141 mmol/L (ref 135–146)
Total Bilirubin: 0.3 mg/dL (ref 0.2–1.2)
Total Protein: 6.8 g/dL (ref 6.1–8.1)
eGFR: 72 mL/min/{1.73_m2} (ref >=60)

## 2021-08-12 MED ORDER — MONTELUKAST SODIUM 10 MG PO TABS: 10.0000 mg | ORAL_TABLET | Freq: Every day | ORAL | 1 refills | Status: DC

## 2021-08-12 MED ORDER — SHINGRIX 50 MCG/0.5ML IM SUSR: 0.5000 mL | Freq: Once | INTRAMUSCULAR | 1 refills | Status: AC

## 2021-08-12 MED ORDER — LEVOCETIRIZINE DIHYDROCHLORIDE 5 MG PO TABS: 5.0000 mg | ORAL_TABLET | Freq: Every evening | ORAL | 1 refills | Status: DC

## 2021-08-12 MED ORDER — ATENOLOL 25 MG PO TABS: 25.0000 mg | ORAL_TABLET | Freq: Every day | ORAL | 1 refills | Status: DC

## 2021-08-12 MED ORDER — ROSUVASTATIN CALCIUM 10 MG PO TABS: 10.0000 mg | ORAL_TABLET | Freq: Every day | ORAL | 1 refills | Status: DC

## 2021-08-12 MED ORDER — TRIAMTERENE-HCTZ 37.5-25 MG PO TABS: 1.0000 | ORAL_TABLET | Freq: Every day | ORAL | 1 refills | Status: DC

## 2021-08-12 NOTE — Addendum Note (Signed)
Addended by: Loistine Chance on: 08/12/2021 09:41 AM   Modules accepted: Orders

## 2021-08-13 DIAGNOSIS — G43809 Other migraine, not intractable, without status migrainosus: Secondary | ICD-10-CM | POA: Diagnosis not present

## 2021-08-27 DIAGNOSIS — E785 Hyperlipidemia, unspecified: Secondary | ICD-10-CM | POA: Diagnosis not present

## 2021-08-27 DIAGNOSIS — Z794 Long term (current) use of insulin: Secondary | ICD-10-CM | POA: Diagnosis not present

## 2021-08-27 DIAGNOSIS — I152 Hypertension secondary to endocrine disorders: Secondary | ICD-10-CM | POA: Diagnosis not present

## 2021-08-27 DIAGNOSIS — E1159 Type 2 diabetes mellitus with other circulatory complications: Secondary | ICD-10-CM | POA: Diagnosis not present

## 2021-08-27 DIAGNOSIS — E559 Vitamin D deficiency, unspecified: Secondary | ICD-10-CM | POA: Diagnosis not present

## 2021-08-27 DIAGNOSIS — E1169 Type 2 diabetes mellitus with other specified complication: Secondary | ICD-10-CM | POA: Diagnosis not present

## 2021-08-27 LAB — MICROALBUMIN, URINE: Microalb, Ur: NEGATIVE

## 2021-08-27 LAB — HEMOGLOBIN A1C: Hemoglobin A1C: 6.6

## 2021-09-03 ENCOUNTER — Telehealth: Payer: Self-pay

## 2021-09-03 NOTE — Telephone Encounter (Signed)
Copied from [place] [id number]. Topic: General - Other >> Sep 02, 2021  4:25 PM Alanda Slim E wrote: Reason for CRM: Pt had an allergy test done and wanted to ask Dr. Ancil Boozer if it was ok for her to eat Cashews and almonds/ she stated she thinks that the preliminary test came back that she couldn't have peanuts and sesame seeds / please advise

## 2021-09-04 NOTE — Telephone Encounter (Signed)
Pt aware to avoid cashews/almonds for now. Pt agreed to go back to allergist. Pt mentioned she did not like her previous allergist and will try to see a new provider.

## 2021-09-15 DIAGNOSIS — S46911A Strain of unspecified muscle, fascia and tendon at shoulder and upper arm level, right arm, initial encounter: Secondary | ICD-10-CM | POA: Diagnosis not present

## 2021-09-16 DIAGNOSIS — I341 Nonrheumatic mitral (valve) prolapse: Secondary | ICD-10-CM | POA: Diagnosis not present

## 2021-09-16 DIAGNOSIS — R001 Bradycardia, unspecified: Secondary | ICD-10-CM | POA: Diagnosis not present

## 2021-09-16 DIAGNOSIS — R5382 Chronic fatigue, unspecified: Secondary | ICD-10-CM | POA: Diagnosis not present

## 2021-09-16 DIAGNOSIS — E119 Type 2 diabetes mellitus without complications: Secondary | ICD-10-CM | POA: Diagnosis not present

## 2021-09-16 DIAGNOSIS — R002 Palpitations: Secondary | ICD-10-CM | POA: Diagnosis not present

## 2021-09-16 DIAGNOSIS — E782 Mixed hyperlipidemia: Secondary | ICD-10-CM | POA: Diagnosis not present

## 2021-09-16 DIAGNOSIS — I10 Essential (primary) hypertension: Secondary | ICD-10-CM | POA: Diagnosis not present

## 2021-09-16 DIAGNOSIS — Z0000 Encounter for general adult medical examination without abnormal findings: Secondary | ICD-10-CM | POA: Diagnosis not present

## 2021-10-02 DIAGNOSIS — I341 Nonrheumatic mitral (valve) prolapse: Secondary | ICD-10-CM | POA: Diagnosis not present

## 2021-10-06 DIAGNOSIS — I499 Cardiac arrhythmia, unspecified: Secondary | ICD-10-CM | POA: Diagnosis not present

## 2021-10-13 ENCOUNTER — Ambulatory Visit (INDEPENDENT_AMBULATORY_CARE_PROVIDER_SITE_OTHER): Payer: Medicare Other

## 2021-10-13 ENCOUNTER — Other Ambulatory Visit: Payer: Self-pay

## 2021-10-13 DIAGNOSIS — Z23 Encounter for immunization: Secondary | ICD-10-CM | POA: Diagnosis not present

## 2021-10-26 DIAGNOSIS — R002 Palpitations: Secondary | ICD-10-CM | POA: Diagnosis not present

## 2021-10-26 DIAGNOSIS — I10 Essential (primary) hypertension: Secondary | ICD-10-CM | POA: Diagnosis not present

## 2021-10-26 DIAGNOSIS — R5382 Chronic fatigue, unspecified: Secondary | ICD-10-CM | POA: Diagnosis not present

## 2021-10-26 DIAGNOSIS — E782 Mixed hyperlipidemia: Secondary | ICD-10-CM | POA: Diagnosis not present

## 2021-10-26 DIAGNOSIS — I341 Nonrheumatic mitral (valve) prolapse: Secondary | ICD-10-CM | POA: Diagnosis not present

## 2021-10-26 DIAGNOSIS — R001 Bradycardia, unspecified: Secondary | ICD-10-CM | POA: Diagnosis not present

## 2021-10-26 DIAGNOSIS — E119 Type 2 diabetes mellitus without complications: Secondary | ICD-10-CM | POA: Diagnosis not present

## 2021-10-29 ENCOUNTER — Other Ambulatory Visit: Payer: Self-pay | Admitting: Family Medicine

## 2021-10-29 DIAGNOSIS — I10 Essential (primary) hypertension: Secondary | ICD-10-CM

## 2021-10-29 NOTE — Telephone Encounter (Signed)
Medication: triamterene-hydrochlorothiazide (MAXZIDE-25) 37.5-25 MG tablet [[id number]]   Has the patient contacted their pharmacy? YES Pharmacy has closed (Agent: If no, request that the patient contact the pharmacy for the refill. If patient does not wish to contact the pharmacy document the reason why and proceed with request.) (Agent: If yes, when and what did the pharmacy advise?)  Preferred Pharmacy (with phone number or street name): [facility] [address], [place]. [address] [phone] Has the patient been seen for an appointment in the last year OR does the patient have an upcoming appointment? YES 08/12/2021  Agent: Please be advised that RX refills may take up to 3 business days. We ask that you follow-up with your pharmacy.

## 2021-10-30 ENCOUNTER — Telehealth: Payer: Self-pay

## 2021-10-30 ENCOUNTER — Other Ambulatory Visit: Payer: Self-pay

## 2021-10-30 DIAGNOSIS — J302 Other seasonal allergic rhinitis: Secondary | ICD-10-CM

## 2021-10-30 DIAGNOSIS — E785 Hyperlipidemia, unspecified: Secondary | ICD-10-CM

## 2021-10-30 DIAGNOSIS — E1169 Type 2 diabetes mellitus with other specified complication: Secondary | ICD-10-CM

## 2021-10-30 DIAGNOSIS — L509 Urticaria, unspecified: Secondary | ICD-10-CM

## 2021-10-30 DIAGNOSIS — I10 Essential (primary) hypertension: Secondary | ICD-10-CM

## 2021-10-30 MED ORDER — ROSUVASTATIN CALCIUM 10 MG PO TABS: 10.0000 mg | ORAL_TABLET | Freq: Every day | ORAL | 1 refills | Status: DC

## 2021-10-30 MED ORDER — ATENOLOL 25 MG PO TABS: 25.0000 mg | ORAL_TABLET | Freq: Every day | ORAL | 1 refills | Status: DC

## 2021-10-30 MED ORDER — MONTELUKAST SODIUM 10 MG PO TABS: 10.0000 mg | ORAL_TABLET | Freq: Every day | ORAL | 1 refills | Status: DC

## 2021-10-30 MED ORDER — LEVOCETIRIZINE DIHYDROCHLORIDE 5 MG PO TABS: 5.0000 mg | ORAL_TABLET | Freq: Every evening | ORAL | 1 refills | Status: DC

## 2021-10-30 MED ORDER — TRIAMTERENE-HCTZ 37.5-25 MG PO TABS: 1.0000 | ORAL_TABLET | Freq: Every day | ORAL | 1 refills | Status: DC

## 2021-10-30 NOTE — Telephone Encounter (Signed)
Copied from [place] [id number]. Topic: General - Other >> Oct 30, 2021 10:53 AM Holley Dexter N wrote: Reason for CRM: Pt called in a little upset about her blood pressure mediation not being sent over to the pharmacy, pt did state she was headed up to the office to get someone to write her a script so she can take it to the pharmacy herself, please advise.  triamterene-hydrochlorothiazide (MAXZIDE-25) 37.5-25 MG tablet

## 2021-11-05 DIAGNOSIS — L668 Other cicatricial alopecia: Secondary | ICD-10-CM | POA: Diagnosis not present

## 2021-11-05 DIAGNOSIS — L648 Other androgenic alopecia: Secondary | ICD-10-CM | POA: Diagnosis not present

## 2021-11-05 DIAGNOSIS — R208 Other disturbances of skin sensation: Secondary | ICD-10-CM | POA: Diagnosis not present

## 2021-12-24 ENCOUNTER — Other Ambulatory Visit: Payer: Self-pay

## 2021-12-24 ENCOUNTER — Ambulatory Visit (INDEPENDENT_AMBULATORY_CARE_PROVIDER_SITE_OTHER): Payer: Medicare Other

## 2021-12-24 VITALS — BP 118/68 | HR 75 | Temp 98.4°F | Resp 16 | Ht 66.0 in | Wt 200.4 lb

## 2021-12-24 DIAGNOSIS — Z0000 Encounter for general adult medical examination without abnormal findings: Secondary | ICD-10-CM | POA: Diagnosis not present

## 2021-12-24 NOTE — Progress Notes (Signed)
Subjective:   Morgan Peterson is a 70 y.o. female who presents for Medicare Annual (Subsequent) preventive examination.  Review of Systems     Cardiac Risk Factors include: advanced age (>66mn, >>10women);diabetes mellitus;dyslipidemia;obesity (BMI >30kg/m2);hypertension     Objective:    Today's Vitals   12/24/21 1131  BP: 118/68  Pulse: 75  Resp: 16  Temp: 98.4 F (36.9 C)  TempSrc: Oral  SpO2: 98%  Weight: 200 lb 6.4 oz (90.9 kg)  Height: _0  (1.676 m)   Body mass index is 32.35 kg/m.  Advanced Directives 12/24/2021 12/23/2020 08/16/2019 08/18/2017 05/02/2017 03/23/2017 10/28/2016  Does Patient Have a Medical Advance Directive? _1  No No  Type of Advance Directive - - - - - - -  Does patient want to make changes to medical advance directive? - - Yes (MAU/Ambulatory/Procedural Areas - Information given) - - - -  Copy of HBrazosin Chart? - - - - - - -  Would patient like information on creating a medical advance directive? No - Patient declined No - Patient declined - - - - No - patient declined information    Current Medications (verified) Outpatient Encounter Medications as of 12/24/2021  Medication Sig   aspirin EC 81 MG tablet Take 1 tablet (81 mg total) by mouth daily.   atenolol (TENORMIN) 25 MG tablet Take 1 tablet (25 mg total) by mouth daily.   azelastine (ASTELIN) 0.1 % nasal spray Place 2 sprays into both nostrils 2 (two) times daily. Use in each nostril as directed   betamethasone, augmented, (DIPROLENE) 0.05 % lotion Apply 1 application topically 2 (two) times daily.   Blood Glucose Monitoring Suppl (ONETOUCH VERIO) w/Device KIT 1 Device by Does not apply route once.   cholecalciferol (VITAMIN D) 1000 UNITS tablet Take 1,000 Units by mouth daily.   EPINEPHrine (EPIPEN 2-PAK) 0.3 mg/0.3 mL IJ SOAJ injection Inject 0.3 mg into the muscle as needed for anaphylaxis.   famotidine (PEPCID) 40 MG tablet Take 1 tablet (40 mg total) by  mouth 2 (two) times daily.   fluocinonide (LIDEX) 0.05 % external solution SMARTSIG:Sparingly Topical Twice Daily   glucose blood (ONETOUCH VERIO) test strip Use as instructed   Lancets (ONETOUCH ULTRASOFT) lancets Use as instructed   levocetirizine (XYZAL) 5 MG tablet Take 1 tablet (5 mg total) by mouth every evening.   montelukast (SINGULAIR) 10 MG tablet Take 1 tablet (10 mg total) by mouth at bedtime.   Multiple Vitamin (MULTIVITAMIN) LIQD Take 5 mLs by mouth daily.   NON FORMULARY CBD oil, patients takes a few drops each morning   OZEMPIC, 0.25 OR 0.5 MG/DOSE, 2 MG/1.5ML SOPN Inject 0.5 mg into the skin once a week.   rizatriptan (MAXALT-MLT) 10 MG disintegrating tablet Take 10 mg by mouth as needed. May repeat in 2 hours if needed   rosuvastatin (CRESTOR) 10 MG tablet Take 1 tablet (10 mg total) by mouth daily.   triamterene-hydrochlorothiazide (MAXZIDE-25) 37.5-25 MG tablet Take 1 tablet by mouth daily.   vitamin B-12 (CYANOCOBALAMIN) 1000 MCG tablet Take 1 tablet by mouth 3 (three) times a week.    ZINC-VITAMIN C PO Take 1 tablet by mouth daily.   No facility-administered encounter medications on file as of 12/24/2021.    Allergies (verified) Levaquin [levofloxacin in d5w], Losartan, Metoprolol, Amlodipine-olmesartan, Canagliflozin, Egg white [egg white (egg protein)], Gabapentin, Glipizide, Gluten meal, Lac bovis, Lisinopril, Metformin and related, Milk-related compounds, Onglyza [saxagliptin], Shrimp extract allergy skin  test, Shrimp [shellfish allergy], Tramadol, Actos [pioglitazone], Atorvastatin, Carvedilol, and Prednisone   History: Past Medical History:  Diagnosis Date   Cancer ([place]) 2010   Right Breast   Diabetes mellitus without complication ([place]) 6734   Hiatal hernia    Hypertension    IBS (irritable bowel syndrome)    Malignant neoplasm of upper-outer quadrant of female breast ([place]) April 11, 2009   Right breast, invasive ductal carcinoma, 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 Arimidex therapy completed September 2015   Personal history of malignant neoplasm of breast 2010   Personal history of radiation therapy 2010   mammosite   Past Surgical History:  Procedure Laterality Date   ABDOMINAL EXPLORATION SURGERY  2000   BREAST BIOPSY Right 2010   +    BREAST EXCISIONAL BIOPSY Right 2010   + mammo site inasive mammo ca   BREAST LUMPECTOMY Right 2010   [facility]   COLONOSCOPY  07/19/2012   Normal exam, Dr. 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  Tobacco Use   Smoking status: Former    Packs/day: 0.25    Years: 10.00    Pack years: 2.50    Types: Cigarettes    Start date: 1978    Quit date: 1988    Years since quitting: 35.0   Smokeless tobacco: Never   Tobacco comments:    smoking cessation materials not required  Vaping Use   Vaping Use: Never used  Substance and Sexual Activity   Alcohol use: Yes    Alcohol/week: 0.0 standard drinks    Comment: wine   Drug use: No   Sexual activity: Not Currently    Comment: husband has ED  Other Topics Concern   Not on file  Social History Narrative   Not on file   Social Determinants of Health   Financial Resource Strain: Low Risk    Difficulty of Paying Living Expenses: Not hard at all  Food Insecurity: No Food Insecurity   Worried About Charity fundraiser in the Last Year: Never true   [place] in the Last Year: Never true  Transportation  Needs: No Transportation Needs   Lack of Transportation (Medical): No   Lack of Transportation (Non-Medical): No  Physical Activity: Inactive   Days of Exercise per Week: 0 days   Minutes of Exercise per Session: 0 min  Stress: No Stress Concern Present   Feeling of Stress : Not at all  Social Connections: Socially Integrated   Frequency of Communication with Friends and Family: More than three times a week   Frequency of Social Gatherings with Friends and Family: More than three times a week   Attends Religious Services: More than 4 times per year   Active Member of [employer] or Organizations: Yes   Attends [employer]  or Organization Meetings: More than 4 times per year   Marital Status: Married    Tobacco Counseling Counseling given: Not Answered Tobacco comments: smoking cessation materials not required   Clinical Intake:  Pre-visit preparation completed: Yes  Pain : No/denies pain     BMI - recorded: 32.35 Nutritional Status: BMI > 30  Obese Nutritional Risks: None Diabetes: Yes CBG done?: No Did pt. bring in CBG monitor from home?: No  How often do you need to have someone help you when you read instructions, pamphlets, or other written materials from your doctor or pharmacy?: 1 - Never  Nutrition Risk Assessment:  Has the patient had any N/V/D within the last 2 months?  No  Does the patient have any non-healing wounds?  No  Has the patient had any unintentional weight loss or weight gain?  No   Diabetes:  Is the patient diabetic?  Yes  If diabetic, was a CBG obtained today?  No  Did the patient bring in their glucometer from home?  No  How often do you monitor your CBG's? daily.   Financial Strains and Diabetes Management:  Are you having any financial strains with the device, your supplies or your medication? No .  Does the patient want to be seen by Chronic Care Management for management of their diabetes?  No  Would the patient like to be referred to a  Nutritionist or for Diabetic Management?  No   Diabetic Exams:  Diabetic Eye Exam: Completed per patient; need report.   Diabetic Foot Exam: Completed 01/20/21.   Interpreter Needed?: No  Information entered by :: Clemetine Marker LPN   Activities of Daily Living In your present state of health, do you have any difficulty performing the following activities: 12/24/2021 08/12/2021  Hearing? N N  Vision? N N  Difficulty concentrating or making decisions? N N  Walking or climbing stairs? N N  Dressing or bathing? N N  Doing errands, shopping? N N  Preparing Food and eating ? N -  Using the Toilet? N -  In the past six months, have you accidently leaked urine? N -  Do you have problems with loss of bowel control? N -  Managing your Medications? N -  Managing your Finances? N -  Housekeeping or managing your Housekeeping? N -  Some recent data might be hidden    Patient Care Team: Steele Sizer, MD as PCP - General (Family Medicine) Bary Castilla, Forest Gleason, MD (General Surgery) Carloyn Manner, MD as Referring Physician (Otolaryngology) Yolonda Kida, MD as Consulting Physician (Cardiology) Ree Edman, MD as Referring Physician (Dermatology) Lonia Farber, MD as Consulting Physician (Internal Medicine)  Indicate any recent Medical Services you may have received from other than [facility] providers in the past year (date may be approximate).     Assessment:   This is a routine wellness examination for [place].  Hearing/Vision screen Hearing Screening - Comments:: Pt denies hearing difficulty Vision Screening - Comments:: Annual vision screenings with Dr. Orion Modest  Dietary issues and exercise activities discussed: Current Exercise Habits: The patient does not participate in regular exercise at present   Goals Addressed   None    Depression Screen PHQ 2/9 Scores 12/24/2021 08/12/2021 05/19/2021 01/20/2021 12/23/2020 09/18/2020 03/18/2020  PHQ - 2 Score 0 0 0 0 0 0 0   PHQ- 9 Score - - 0 - - - 2    Fall Risk Fall Risk  12/24/2021 08/12/2021 05/19/2021 01/20/2021 12/23/2020  Falls in the  past year? 0 0 0 0 0  Number falls in past yr: 0 0 0 0 0  Injury with Fall? 0 0 0 0 0  Risk for fall due to : No Fall Risks No Fall Risks - - No Fall Risks  Follow up Falls prevention discussed Falls prevention discussed - - Falls prevention discussed    FALL RISK PREVENTION PERTAINING TO THE HOME:  Any stairs in or around the home? Yes  If so, are there any without handrails? No  Home free of loose throw rugs in walkways, pet beds, electrical cords, etc? Yes  Adequate lighting in your home to reduce risk of falls? Yes   ASSISTIVE DEVICES UTILIZED TO PREVENT FALLS:  Life alert? No  Use of a cane, walker or w/c? No  Grab bars in the bathroom? Yes  Shower chair or bench in shower? Yes  Elevated toilet seat or a handicapped toilet? No   TIMED UP AND GO:  Was the test performed? Yes .  Length of time to ambulate 10 feet: 5 sec.   Gait steady and fast without use of assistive device  Cognitive Function: Normal cognitive status assessed by direct observation by this Nurse Health Advisor. No abnormalities found.          Immunizations Immunization History  Administered Date(s) Administered   Fluad Quad(high Dose 65+) 10/09/2019, 10/13/2020   Influenza Split 11/13/2012   Influenza, High Dose Seasonal PF 08/18/2017, 10/13/2018, 10/13/2021   Influenza, Seasonal, Injecte, Preservative Fre 11/19/2010, 08/17/2011   Influenza,inj,Quad PF,6+ Mos 11/07/2013, 11/21/2014, 10/03/2015, 09/03/2016   Influenza-Unspecified 11/07/2013, 11/21/2014, 10/03/2015, 09/03/2016   PFIZER(Purple Top)SARS-COV-2 Vaccination 03/31/2020, 04/23/2020, 12/10/2020   PPD Test 07/22/2021   Pneumococcal Conjugate-13 05/14/2014, 11/06/2019   Pneumococcal Polysaccharide-23 11/19/2010, 11/26/2016   Tdap 11/19/2010, 01/20/2021   Zoster, Live 07/23/2013    TDAP status: Up to date  Flu Vaccine  status: Up to date  Pneumococcal vaccine status: Up to date  Covid-19 vaccine status: Completed vaccines  Qualifies for Shingles Vaccine? Yes   Zostavax completed Yes   Shingrix Completed?: No.    Education has been provided regarding the importance of this vaccine. Patient has been advised to call insurance company to determine out of pocket expense if they have not yet received this vaccine. Advised may also receive vaccine at local pharmacy or Health Dept. Verbalized acceptance and understanding.  Screening Tests Health Maintenance  Topic Date Due   Zoster Vaccines- Shingrix (1 of 2) Never done   OPHTHALMOLOGY EXAM  06/05/2020   COVID-19 Vaccine (4 - Booster for Pfizer series) 02/04/2021   URINE MICROALBUMIN  08/19/2021   HEMOGLOBIN A1C  08/22/2021   FOOT EXAM  01/20/2022   COLONOSCOPY (Pts 45-78yr Insurance coverage will need to be confirmed)  07/19/2022   MAMMOGRAM  03/14/2023   TETANUS/TDAP  01/20/2031   Pneumonia Vaccine 71 Years old  Completed   INFLUENZA VACCINE  Completed   DEXA SCAN  Completed   Hepatitis C Screening  Completed   HPV VACCINES  Aged Out    Health Maintenance  Health Maintenance Due  Topic Date Due   Zoster Vaccines- Shingrix (1 of 2) Never done   OPHTHALMOLOGY EXAM  06/05/2020   COVID-19 Vaccine (4 - Booster for Pfizer series) 02/04/2021   URINE MICROALBUMIN  08/19/2021   HEMOGLOBIN A1C  08/22/2021    Colorectal cancer screening: Type of screening: Colonoscopy. Completed 07/19/12. Repeat every 10 years  Mammogram status: Completed 03/13/21. Repeat every year  Bone Density status: Completed 11/08/12.  Results reflect: Bone density results: NORMAL. Repeat every 2 years.  Lung Cancer Screening: (Low Dose CT Chest recommended if Age 25-80 years, 30 pack-year currently smoking OR have quit w/in 15years.) does not qualify.   Additional Screening:  Hepatitis C Screening: does qualify; Completed 11/22/12  Vision Screening: Recommended annual  ophthalmology exams for early detection of glaucoma and other disorders of the eye. Is the patient up to date with their annual eye exam?  Yes  Who is the provider or what is the name of the office in which the patient attends annual eye exams? [facility].   Dental Screening: Recommended annual dental exams for proper oral hygiene  Community Resource Referral / Chronic Care Management: CRR required this visit?  No   CCM required this visit?  No      Plan:     I have personally reviewed and noted the following in the patients chart:   Medical and social history Use of alcohol, tobacco or illicit drugs  Current medications and supplements including opioid prescriptions.  Functional ability and status Nutritional status Physical activity Advanced directives List of other physicians Hospitalizations, surgeries, and ER visits in previous 12 months Vitals Screenings to include cognitive, depression, and falls Referrals and appointments  In addition, I have reviewed and discussed with patient certain preventive protocols, quality metrics, and best practice recommendations. A written personalized care plan for preventive services as well as general preventive health recommendations were provided to patient.     Clemetine Marker, LPN   01/24/538   Nurse Notes: none

## 2021-12-24 NOTE — Patient Instructions (Signed)
Morgan Peterson , Thank you for taking time to come for your Medicare Wellness Visit. I appreciate your ongoing commitment to your health goals. Please review the following plan we discussed and let me know if I can assist you in the future.   Screening recommendations/referrals: Colonoscopy: done 07/19/12; due 06/2022 Mammogram: done 03/13/21 Bone Density: done 11/08/12 Recommended yearly ophthalmology/optometry visit for glaucoma screening and checkup Recommended yearly dental visit for hygiene and checkup  Vaccinations: Influenza vaccine: done 10/13/21 Pneumococcal vaccine: done 11/06/19 Tdap vaccine: done 01/20/21 Shingles vaccine: Shingrix discussed. Please contact your pharmacy for coverage information.  Covid-19: done 03/31/20, 04/23/20  Advanced directives: Advance directive discussed with you today. Even though you declined this today please call our office should you change your mind and we can give you the proper paperwork for you to fill out.   Conditions/risks identified: Recommend increasing physical activity to at least 3 days per week   Next appointment: Follow up in one year for your annual wellness visit    Preventive Care 65 Years and Older, Female Preventive care refers to lifestyle choices and visits with your health care provider that can promote health and wellness. What does preventive care include? A yearly physical exam. This is also called an annual well check. Dental exams once or twice a year. Routine eye exams. Ask your health care provider how often you should have your eyes checked. Personal lifestyle choices, including: Daily care of your teeth and gums. Regular physical activity. Eating a healthy diet. Avoiding tobacco and drug use. Limiting alcohol use. Practicing safe sex. Taking low-dose aspirin every day. Taking vitamin and mineral supplements as recommended by your health care provider. What happens during an annual well check? The services and  screenings done by your health care provider during your annual well check will depend on your age, overall health, lifestyle risk factors, and family history of disease. Counseling  Your health care provider may ask you questions about your: Alcohol use. Tobacco use. Drug use. Emotional well-being. Home and relationship well-being. Sexual activity. Eating habits. History of falls. Memory and ability to understand (cognition). Work and work Statistician. Reproductive health. Screening  You may have the following tests or measurements: Height, weight, and BMI. Blood pressure. Lipid and cholesterol levels. These may be checked every 5 years, or more frequently if you are over 110 years old. Skin check. Lung cancer screening. You may have this screening every year starting at age 35 if you have a 30-pack-year history of smoking and currently smoke or have quit within the past 15 years. Fecal occult blood test (FOBT) of the stool. You may have this test every year starting at age 20. Flexible sigmoidoscopy or colonoscopy. You may have a sigmoidoscopy every 5 years or a colonoscopy every 10 years starting at age 12. Hepatitis C blood test. Hepatitis B blood test. Sexually transmitted disease (STD) testing. Diabetes screening. This is done by checking your blood sugar (glucose) after you have not eaten for a while (fasting). You may have this done every 1-3 years. Bone density scan. This is done to screen for osteoporosis. You may have this done starting at age 26. Mammogram. This may be done every 1-2 years. Talk to your health care provider about how often you should have regular mammograms. Talk with your health care provider about your test results, treatment options, and if necessary, the need for more tests. Vaccines  Your health care provider may recommend certain vaccines, such as: Influenza vaccine. This is recommended  every year. Tetanus, diphtheria, and acellular pertussis (Tdap,  Td) vaccine. You may need a Td booster every 10 years. Zoster vaccine. You may need this after age 51. Pneumococcal 13-valent conjugate (PCV13) vaccine. One dose is recommended after age 32. Pneumococcal polysaccharide (PPSV23) vaccine. One dose is recommended after age 69. Talk to your health care provider about which screenings and vaccines you need and how often you need them. This information is not intended to replace advice given to you by your health care provider. Make sure you discuss any questions you have with your health care provider. Document Released: 01/02/2016 Document Revised: 08/25/2016 Document Reviewed: 10/07/2015 Elsevier Interactive Patient Education  2017 [place] Prevention in the Home Falls can cause injuries. They can happen to people of all ages. There are many things you can do to make your home safe and to help prevent falls. What can I do on the outside of my home? Regularly fix the edges of walkways and driveways and fix any cracks. Remove anything that might make you trip as you walk through a door, such as a raised step or threshold. Trim any bushes or trees on the path to your home. Use bright outdoor lighting. Clear any walking paths of anything that might make someone trip, such as rocks or tools. Regularly check to see if handrails are loose or broken. Make sure that both sides of any steps have handrails. Any raised decks and porches should have guardrails on the edges. Have any leaves, snow, or ice cleared regularly. Use sand or salt on walking paths during winter. Clean up any spills in your garage right away. This includes oil or grease spills. What can I do in the bathroom? Use night lights. Install grab bars by the toilet and in the tub and shower. Do not use towel bars as grab bars. Use non-skid mats or decals in the tub or shower. If you need to sit down in the shower, use a plastic, non-slip stool. Keep the floor dry. Clean up any  water that spills on the floor as soon as it happens. Remove soap buildup in the tub or shower regularly. Attach bath mats securely with double-sided non-slip rug tape. Do not have throw rugs and other things on the floor that can make you trip. What can I do in the bedroom? Use night lights. Make sure that you have a light by your bed that is easy to reach. Do not use any sheets or blankets that are too big for your bed. They should not hang down onto the floor. Have a firm chair that has side arms. You can use this for support while you get dressed. Do not have throw rugs and other things on the floor that can make you trip. What can I do in the kitchen? Clean up any spills right away. Avoid walking on wet floors. Keep items that you use a lot in easy-to-reach places. If you need to reach something above you, use a strong step stool that has a grab bar. Keep electrical cords out of the way. Do not use floor polish or wax that makes floors slippery. If you must use wax, use non-skid floor wax. Do not have throw rugs and other things on the floor that can make you trip. What can I do with my stairs? Do not leave any items on the stairs. Make sure that there are handrails on both sides of the stairs and use them. Fix handrails that are broken  or loose. Make sure that handrails are as long as the stairways. Check any carpeting to make sure that it is firmly attached to the stairs. Fix any carpet that is loose or worn. Avoid having throw rugs at the top or bottom of the stairs. If you do have throw rugs, attach them to the floor with carpet tape. Make sure that you have a light switch at the top of the stairs and the bottom of the stairs. If you do not have them, ask someone to add them for you. What else can I do to help prevent falls? Wear shoes that: Do not have high heels. Have rubber bottoms. Are comfortable and fit you well. Are closed at the toe. Do not wear sandals. If you use a  stepladder: Make sure that it is fully opened. Do not climb a closed stepladder. Make sure that both sides of the stepladder are locked into place. Ask someone to hold it for you, if possible. Clearly mark and make sure that you can see: Any grab bars or handrails. First and last steps. Where the edge of each step is. Use tools that help you move around (mobility aids) if they are needed. These include: Canes. Walkers. Scooters. Crutches. Turn on the lights when you go into a dark area. Replace any light bulbs as soon as they burn out. Set up your furniture so you have a clear path. Avoid moving your furniture around. If any of your floors are uneven, fix them. If there are any pets around you, be aware of where they are. Review your medicines with your doctor. Some medicines can make you feel dizzy. This can increase your chance of falling. Ask your doctor what other things that you can do to help prevent falls. This information is not intended to replace advice given to you by your health care provider. Make sure you discuss any questions you have with your health care provider. Document Released: 10/02/2009 Document Revised: 05/13/2016 Document Reviewed: 01/10/2015 Elsevier Interactive Patient Education  2017 [employer].

## 2021-12-30 NOTE — Progress Notes (Signed)
Name: Morgan Peterson   MRN: [id number]    DOB: 1951-03-23   Date:12/31/2021       Progress Note  Subjective  Chief Complaint  Annual Exam  HPI  Patient presents for annual CPE and follow up, she is aware of possible additional cost   Hives: back in Oct 2020 she developed hives after eating , it happened two more times and she came in to see me. She had a lab test that showed a lot of food allergies, she saw allergies and patch test was negative. She has been avoiding gluten, milk , egg white, peanut , shrimp, scallop, sesame seed .  She is still following the diet, too afraid to having hives again. She misses eating egg whites , but happy to not have another outbreak. She still has an Epipen .    HTN: She has been taking medication as prescribed Atenolol and Triamterene HCTZ,  denies chest pain or dizziness  but she has palpitation when tired or stressed out  Allergic to ACE - angioedema. BP has been at goal, she checks three times a week and has been in the 118-120's/60-70's.    DM: she is seeing  Endocrinologist Dr. Honor Junes , last A1C was at goal at 6.6% .  Fsbs has been well controlled, fasting between 70-110 .She has associated dyslipidemia and obesity. She. Denies polyphagia, polydipsia or polyuria . Last LDL was above 70, but she is now taking Crestor 10 mg instead of 5 mg and is tolerating it well, we will recheck  labs today    Migraine headaches: she was seen by Dr. Manuella Ghazi, had labs done in May 22, and MRI was unremarkable. 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 She states her bowel movements are better since she has been eating oatmeal and more fiber in her diet, no loner taking Miralax, she is taking Curerelle otc and drinking more water    Thyroid nodule: monitored by Dr. Manfred Shirts, last TSH normal , continue regular follow up   Carotid  atherosclerosis: found on Cspine x-ray, also on exam today she has a pulstaing mass on right side of neck   Intermittent low back pain: dull aching, sometimes burning, no radiculitis, she stopped baclofen now  and sees Dr. Rock Nephew chiropractor prn  and is stable     Diet: she is eating a healthy diet  Exercise: discussed regular physical activity    Flowsheet Row Clinical Support from 12/24/2021 in [facility]  AUDIT-C Score 2      Depression: Phq 9 is  negative Depression screen [facility] 2/9 12/31/2021 12/24/2021 08/12/2021 05/19/2021 01/20/2021  Decreased Interest 0 0 0 0 0  Down, Depressed, Hopeless 0 0 0 0 0  PHQ - 2 Score 0 0 0 0 0  Altered sleeping 0 - - 0 -  Tired, decreased energy 0 - - 0 -  Change in appetite 0 - - 0 -  Feeling bad or failure about yourself  0 - - 0 -  Trouble concentrating 0 - - 0 -  Moving slowly or fidgety/restless 0 - - 0 -  Suicidal thoughts 0 - - 0 -  PHQ-9 Score 0 - - 0 -  Difficult doing work/chores - - - Not difficult at all -  Some recent data might be hidden   Hypertension: BP Readings from Last 3 Encounters:  12/31/21 132/76  12/24/21 118/68  08/12/21 116/70   Obesity: Wt Readings from Last 3 Encounters:  12/31/21 201 lb (91.2 kg)  12/24/21 200 lb 6.4 oz (90.9 kg)  08/12/21 197 lb (89.4 kg)   BMI Readings from Last 3 Encounters:  12/31/21 32.44 kg/m  12/24/21 32.35 kg/m  08/12/21 31.80 kg/m     Vaccines:   Shingrix: she will get it at local pharmacy  Pneumonia: educated and discussed with patient. Flu: up to date  COVID-19: discussed bivalent booster   Hep C Screening: 11/22/12 STD testing and prevention (HIV/chl/gon/syphilis): N/A Intimate partner violence: negative Sexual History :no pain during sex, no vulva lesions or vaginal discharge.  Menstrual History/LMP/Abnormal Bleeding: post-menopausal, discussed post-menopausal bleeding  Incontinence Symptoms: no problems  Breast cancer:  - Last Mammogram:  03/13/21 - BRCA gene screening: she had breast cancer and is under the care of Dr. Fleet Contras   Osteoporosis prevention: ordering bone density today   Cervical cancer screening: N/A  Skin cancer: Discussed monitoring for atypical lesions  Colorectal cancer: 07/19/12   Lung cancer: Low Dose CT Chest recommended if Age 54-80 years, 20 pack-year currently smoking OR have quit w/in 15years. Patient does not qualify.   ECG: 09/14/17  Advanced Care Planning: A voluntary discussion about advance care planning including the explanation and discussion of advance directives.  Discussed health care proxy and Living will, and the patient was able to identify a health care proxy as husband.  Patient does not have a living will at present time. If patient does have living will, I have requested they bring this to the clinic to be scanned in to their chart.  Lipids: Lab Results  Component Value Date   CHOL 139 08/12/2021   CHOL 141 08/19/2020   CHOL 151 06/26/2019   Lab Results  Component Value Date   HDL 46 (L) 08/12/2021   HDL 48 08/19/2020   HDL 49 06/26/2019   Lab Results  Component Value Date   LDLCALC 78 08/12/2021   LDLCALC 87 06/26/2019   LDLCALC 86 11/03/2018   Lab Results  Component Value Date   TRIG 71 08/12/2021   TRIG 80 08/19/2020   TRIG 75 06/26/2019   Lab Results  Component Value Date   CHOLHDL 3.0 08/12/2021   CHOLHDL 3.0 11/03/2018   CHOLHDL 3.1 11/04/2015   No results found for: LDLDIRECT  Glucose: Glucose  Date Value Ref Range Status  03/24/2015 162 (H) mg/dL Final    Comment:    65-99 NOTE: New Reference Range  02/25/15   09/12/2014 104 (H) 65 - 99 mg/dL Final  03/14/2014 146 (H) 65 - 99 mg/dL Final   Glucose, Bld  Date Value Ref Range Status  08/12/2021 94 65 - 99 mg/dL Final    Comment:    .            Fasting reference interval .   10/09/2019 99 65 - 99 mg/dL Final    Comment:    .            Fasting reference interval .   11/03/2018 71 65  - 139 mg/dL Final    Comment:    .        Non-fasting reference interval .    Glucose-Capillary  Date Value Ref Range Status  10/09/2015 353 (H) 65 - 99 mg/dL Final  10/01/2015 341 (H) 65 - 99 mg/dL Final  10/01/2015 347 (H) 65 - 99 mg/dL Final    Patient Active Problem List  Diagnosis Date Noted   Hypertension associated with type 2 diabetes mellitus ([place]) 01/20/2021   Chronic low back pain without sciatica 08/18/2017   Irritable bowel syndrome with constipation 05/02/2017   Vitamin D deficiency 09/09/2016   Hiatal hernia 06/02/2016   Allergic rhinitis, seasonal 06/02/2016   Obesity (BMI 30-39.9) 04/30/2016   Angio-edema 04/30/2016   Hyperlipidemia 11/04/2015   Hyperlipidemia due to type 2 diabetes mellitus ([place]) 11/04/2015   Cervical radiculopathy at C6 07/31/2015   Neuroma digital nerve 07/31/2015   Type 2 diabetes mellitus with hyperglycemia, with long-term current use of insulin ([place]) 06/30/2015   Right shoulder pain 06/30/2015   Cervical disc disease 06/30/2015   Essential hypertension 06/30/2015   History of breast cancer in female 03/23/2009    Past Surgical History:  Procedure Laterality Date   ABDOMINAL EXPLORATION SURGERY  2000   BREAST BIOPSY Right 2010   +    BREAST EXCISIONAL BIOPSY Right 2010   + mammo site inasive mammo ca   BREAST [facility]UM[facility]OMY Right 2010   [facility]   COLONOSCOPY  07/19/2012   Normal exam, Dr. 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  Tobacco Use    Smoking status: Former    Packs/day: 0.25    Years: 10.00    Pack years: 2.50    Types: Cigarettes    Start date: 1978    Quit date: 1988    Years since quitting: 35.0   Smokeless tobacco: Never   Tobacco comments:    smoking cessation materials not required  Vaping Use   Vaping Use: Never used  Substance and Sexual Activity   Alcohol use: Yes    Alcohol/week: 0.0 standard drinks    Comment: wine   Drug use: No   Sexual activity: Not Currently    Comment: husband has ED  Other Topics Concern   Not on file  Social History Narrative   Not on file   Social Determinants of Health   Financial Resource Strain: Low Risk    Difficulty of Paying Living Expenses: Not hard at all  Food Insecurity: No Food Insecurity   Worried About Charity fundraiser in the Last Year: Never true   [place] in the Last Year: Never true  Transportation Needs: No Transportation Needs   Lack of Transportation (Medical): No   Lack of Transportation (Non-Medical): No  Physical Activity: Inactive   Days of Exercise per Week: 0 days   Minutes of Exercise per Session: 0 min  Stress: No Stress Concern Present   Feeling of Stress : Only a little  Social Connections: Moderately Integrated   Frequency of Communication with Friends and Family: More than three times a week   Frequency of Social Gatherings with Friends and Family: Three times a week   Attends Religious Services: More than 4 times per year   Active  Member of Clubs or Organizations: No   Attends Music therapist: Never   Marital Status: Married  Human resources officer Violence: Not At Risk   Fear of Current or Ex-Partner: No   Emotionally Abused: No   Physically Abused: No   Sexually Abused: No     Current Outpatient Medications:    aspirin EC 81 MG tablet, Take 1 tablet (81 mg total) by mouth daily., Disp: 30 tablet, Rfl: 0   atenolol (TENORMIN) 25 MG tablet, Take 1 tablet (25 mg total) by mouth daily., Disp: 90 tablet,  Rfl: 1   azelastine (ASTELIN) 0.1 % nasal spray, Place 2 sprays into both nostrils 2 (two) times daily. Use in each nostril as directed, Disp: , Rfl:    betamethasone, augmented, (DIPROLENE) 0.05 % lotion, Apply 1 application topically 2 (two) times daily., Disp: , Rfl:    Blood Glucose Monitoring Suppl (ONETOUCH VERIO) w/Device KIT, 1 Device by Does not apply route once., Disp: 1 kit, Rfl: 0   cholecalciferol (VITAMIN D) 1000 UNITS tablet, Take 1,000 Units by mouth daily., Disp: , Rfl:    EPINEPHrine (EPIPEN 2-PAK) 0.3 mg/0.3 mL IJ SOAJ injection, Inject 0.3 mg into the muscle as needed for anaphylaxis., Disp: 2 each, Rfl: 0   famotidine (PEPCID) 40 MG tablet, Take 1 tablet (40 mg total) by mouth 2 (two) times daily., Disp: 180 tablet, Rfl: 1   fluocinonide (LIDEX) 0.05 % external solution, SMARTSIG:Sparingly Topical Twice Daily, Disp: , Rfl:    glucose blood (ONETOUCH VERIO) test strip, Use as instructed, Disp: 100 each, Rfl: 12   Lancets (ONETOUCH ULTRASOFT) lancets, Use as instructed, Disp: 100 each, Rfl: 3   levocetirizine (XYZAL) 5 MG tablet, Take 1 tablet (5 mg total) by mouth every evening., Disp: 90 tablet, Rfl: 1   montelukast (SINGULAIR) 10 MG tablet, Take 1 tablet (10 mg total) by mouth at bedtime., Disp: 90 tablet, Rfl: 1   Multiple Vitamin (MULTIVITAMIN) LIQD, Take 5 mLs by mouth daily., Disp: , Rfl:    NON FORMULARY, CBD oil, patients takes a few drops each morning, Disp: , Rfl:    OZEMPIC, 0.25 OR 0.5 MG/DOSE, 2 MG/1.5ML SOPN, Inject 0.5 mg into the skin once a week., Disp: , Rfl:    rizatriptan (MAXALT-MLT) 10 MG disintegrating tablet, Take 10 mg by mouth as needed. May repeat in 2 hours if needed, Disp: , Rfl:    rosuvastatin (CRESTOR) 10 MG tablet, Take 1 tablet (10 mg total) by mouth daily., Disp: 90 tablet, Rfl: 1   triamterene-hydrochlorothiazide (MAXZIDE-25) 37.5-25 MG tablet, Take 1 tablet by mouth daily., Disp: 90 tablet, Rfl: 1   vitamin B-12 (CYANOCOBALAMIN) 1000 MCG  tablet, Take 1 tablet by mouth 3 (three) times a week. , Disp: , Rfl:    ZINC-VITAMIN C PO, Take 1 tablet by mouth daily., Disp: , Rfl:   Allergies  Allergen Reactions   Levaquin [Levofloxacin In D5w] Swelling    Other reaction(s): Arthralgia (Joint Pain)   Losartan     Other reaction(s): Angioedema   Metoprolol Swelling    Lip and tongue tingling and numbness   Amlodipine-Olmesartan    Canagliflozin Other (See Comments)    Bladder pain Other reaction(s): Other (See Comments), Other (See Comments) Bladder pain   Egg White [Egg White (Egg Protein)]    Gabapentin     cns side effects.   Glipizide     Other reaction(s): Abdominal pain   Gluten Meal    Lac Bovis  Other reaction(s): Other (See Comments)   Lisinopril Swelling    Angioedema   Metformin And Related Nausea Only   Milk-Related Compounds    Onglyza [Saxagliptin] Other (See Comments)    Increased PVC's   Shrimp Extract Allergy Skin Test     Other reaction(s): Other (See Comments)   Shrimp [Shellfish Allergy]    Tramadol Nausea And Vomiting   Actos [Pioglitazone] Other (See Comments) and Nausea And Vomiting    Bladder pain   Atorvastatin Other (See Comments)    Other reaction(s): Abdominal Pain, Other (See Comments)   Carvedilol Other (See Comments)    NUMBNESS, TINGLING, ACHING IN ARMS   Prednisone Palpitations     ROS  Constitutional: Negative for fever or weight change.  Respiratory: Negative for cough and shortness of breath.   Cardiovascular: Negative for chest pain or palpitations.  Gastrointestinal: Negative for abdominal pain, no bowel changes.  Musculoskeletal: Negative for gait problem or joint swelling.  Skin: Negative for rash.  Neurological: Negative for dizziness or headache.  No other specific complaints in a complete review of systems (except as listed in HPI above).   Objective  Vitals:   12/31/21 0917  BP: 132/76  Pulse: 78  Resp: 16  Temp: 98.1 F (36.7 C)  SpO2: 97%  Weight:  201 lb (91.2 kg)  Height: '5\' 6"'  (1.676 m)    Body mass index is 32.44 kg/m.  Physical Exam  Constitutional: Patient appears well-developed and well-nourished. No distress.  HENT: Head: Normocephalic and atraumatic. Ears: B TMs ok, no erythema or effusion; Nose: Not done  Mouth/Throat: not done  Eyes: Conjunctivae and EOM are normal. Pupils are equal, round, and reactive to light. No scleral icterus.  Neck: Normal range of motion. Neck supple. Positive pulsating mass on right side of neck.  No thyromegaly present.  Cardiovascular: Normal rate, regular rhythm and normal heart sounds.  No murmur heard. No BLE edema. Pulmonary/Chest: Effort normal and breath sounds normal. No respiratory distress. Abdominal: Soft. Bowel sounds are normal, no distension. There is no tenderness. no masses Breast: no lumps or masses, no nipple discharge or rashes FEMALE GENITALIA:  Not done  RECTAL:not done  Musculoskeletal: Normal range of motion, no joint effusions. No gross deformities Neurological: he is alert and oriented to person, place, and time. No cranial nerve deficit. Coordination, balance, strength, speech and gait are normal.  Skin: Skin is warm and dry. No rash noted. No erythema.  Psychiatric: Patient has a normal mood and affect. behavior is normal. Judgment and thought content normal.   Fall Risk: Fall Risk  12/31/2021 12/24/2021 08/12/2021 05/19/2021 01/20/2021  Falls in the past year? 0 0 0 0 0  Number falls in past yr: 0 0 0 0 0  Injury with Fall? 0 0 0 0 0  Risk for fall due to : No Fall Risks No Fall Risks No Fall Risks - -  Follow up Falls prevention discussed Falls prevention discussed Falls prevention discussed - -     Functional Status Survey: Is the patient deaf or have difficulty hearing?: No Does the patient have difficulty seeing, even when wearing glasses/contacts?: No Does the patient have difficulty concentrating, remembering, or making decisions?: No Does the patient have  difficulty walking or climbing stairs?: No Does the patient have difficulty dressing or bathing?: No Does the patient have difficulty doing errands alone such as visiting a doctor's office or shopping?: No   Assessment & Plan  1. Well adult exam  - Lipid panel -  COMPLETE METABOLIC PANEL WITH GFR  2. Essential hypertension  At goal   3. Dyslipidemia associated with type 2 diabetes mellitus (HCC)  - Lipid panel  4. Vitamin D deficiency   5. Hypertension associated with type 2 diabetes mellitus (HCC)  - COMPLETE METABOLIC PANEL WITH GFR  6. Atherosclerosis of both carotid arteries  Cheryl ordered carotic doppler because c-spine x-ray showed plaques, on exam today she also has a pulsating mass on right side of neck, advised to have doppler done and we schedule it for her   7. Hives  Doing well with dietary modification   8. Seasonal allergic rhinitis, unspecified trigger   9. Ovarian failure  - DG Bone Density; Future  10. Osteoporosis screening  - DG Bone Density; Future  11. Colon cancer screening  - Ambulatory referral to General Surgery   -USPSTF grade A and B recommendations reviewed with patient; age-appropriate recommendations, preventive care, screening tests, etc discussed and encouraged; healthy living encouraged; see AVS for patient education given to patient -Discussed importance of 150 minutes of physical activity weekly, eat two servings of fish weekly, eat one serving of tree nuts ( cashews, pistachios, pecans, almonds.Marland Kitchen) every other day, eat 6 servings of fruit/vegetables daily and drink plenty of water and avoid sweet beverages.

## 2021-12-31 ENCOUNTER — Other Ambulatory Visit: Payer: Self-pay

## 2021-12-31 ENCOUNTER — Encounter: Payer: Self-pay | Admitting: Family Medicine

## 2021-12-31 ENCOUNTER — Ambulatory Visit (INDEPENDENT_AMBULATORY_CARE_PROVIDER_SITE_OTHER): Payer: Medicare Other | Admitting: Family Medicine

## 2021-12-31 VITALS — BP 132/76 | HR 78 | Temp 98.1°F | Resp 16 | Ht 66.0 in | Wt 201.0 lb

## 2021-12-31 DIAGNOSIS — I10 Essential (primary) hypertension: Secondary | ICD-10-CM | POA: Diagnosis not present

## 2021-12-31 DIAGNOSIS — I6523 Occlusion and stenosis of bilateral carotid arteries: Secondary | ICD-10-CM

## 2021-12-31 DIAGNOSIS — I152 Hypertension secondary to endocrine disorders: Secondary | ICD-10-CM

## 2021-12-31 DIAGNOSIS — Z13820 Encounter for screening for osteoporosis: Secondary | ICD-10-CM

## 2021-12-31 DIAGNOSIS — L509 Urticaria, unspecified: Secondary | ICD-10-CM

## 2021-12-31 DIAGNOSIS — Z0000 Encounter for general adult medical examination without abnormal findings: Secondary | ICD-10-CM

## 2021-12-31 DIAGNOSIS — E2839 Other primary ovarian failure: Secondary | ICD-10-CM

## 2021-12-31 DIAGNOSIS — E1159 Type 2 diabetes mellitus with other circulatory complications: Secondary | ICD-10-CM

## 2021-12-31 DIAGNOSIS — E1169 Type 2 diabetes mellitus with other specified complication: Secondary | ICD-10-CM | POA: Diagnosis not present

## 2021-12-31 DIAGNOSIS — E785 Hyperlipidemia, unspecified: Secondary | ICD-10-CM

## 2021-12-31 DIAGNOSIS — E559 Vitamin D deficiency, unspecified: Secondary | ICD-10-CM | POA: Diagnosis not present

## 2021-12-31 DIAGNOSIS — Z1211 Encounter for screening for malignant neoplasm of colon: Secondary | ICD-10-CM

## 2021-12-31 DIAGNOSIS — J302 Other seasonal allergic rhinitis: Secondary | ICD-10-CM

## 2021-12-31 LAB — LIPID PANEL
Cholesterol: 147 mg/dL (ref <200)
HDL: 55 mg/dL (ref >=50)
LDL Cholesterol (Calc): 77 mg/dL (calc)
Non-HDL Cholesterol (Calc): 92 mg/dL (calc) (ref <130)
Total CHOL/HDL Ratio: 2.7 (calc) (ref <5.0)
Triglycerides: 71 mg/dL (ref <150)

## 2021-12-31 LAB — COMPLETE METABOLIC PANEL WITH GFR
AG Ratio: 1.1 (calc) (ref 1.0–2.5)
ALT: 10 U/L (ref 6–29)
AST: 15 U/L (ref 10–35)
Albumin: 3.6 g/dL (ref 3.6–5.1)
Alkaline phosphatase (APISO): 87 U/L (ref 37–153)
BUN: 9 mg/dL (ref 7–25)
CO2: 30 mmol/L (ref 20–32)
Calcium: 9.3 mg/dL (ref 8.6–10.4)
Chloride: 105 mmol/L (ref 98–110)
Creat: 0.81 mg/dL (ref 0.60–1.00)
Globulin: 3.2 g/dL (calc) (ref 1.9–3.7)
Glucose, Bld: 111 mg/dL — ABNORMAL HIGH (ref 65–99)
Potassium: 3.8 mmol/L (ref 3.5–5.3)
Sodium: 142 mmol/L (ref 135–146)
Total Bilirubin: 0.4 mg/dL (ref 0.2–1.2)
Total Protein: 6.8 g/dL (ref 6.1–8.1)
eGFR: 78 mL/min/{1.73_m2} (ref >=60)

## 2021-12-31 MED ORDER — FAMOTIDINE 40 MG PO TABS: 40.0000 mg | ORAL_TABLET | Freq: Two times a day (BID) | ORAL | 1 refills | Status: DC

## 2021-12-31 NOTE — Patient Instructions (Signed)
Preventive Care 65 Years and Older, Female °Preventive care refers to lifestyle choices and visits with your health care provider that can promote health and wellness. Preventive care visits are also called wellness exams. °What can I expect for my preventive care visit? °Counseling °Your health care provider may ask you questions about your: °Medical history, including: °Past medical problems. °Family medical history. °Pregnancy and menstrual history. °History of falls. °Current health, including: °Memory and ability to understand (cognition). °Emotional well-being. °Home life and relationship well-being. °Sexual activity and sexual health. °Lifestyle, including: °Alcohol, nicotine or tobacco, and drug use. °Access to firearms. °Diet, exercise, and sleep habits. °Work and work environment. °Sunscreen use. °Safety issues such as seatbelt and bike helmet use. °Physical exam °Your health care provider will check your: °Height and weight. These may be used to calculate your BMI (body mass index). BMI is a measurement that tells if you are at a healthy weight. °Waist circumference. This measures the distance around your waistline. This measurement also tells if you are at a healthy weight and may help predict your risk of certain diseases, such as type 2 diabetes and high blood pressure. °Heart rate and blood pressure. °Body temperature. °Skin for abnormal spots. °What immunizations do I need? °Vaccines are usually given at various ages, according to a schedule. Your health care provider will recommend vaccines for you based on your age, medical history, and lifestyle or other factors, such as travel or where you work. °What tests do I need? °Screening °Your health care provider may recommend screening tests for certain conditions. This may include: °Lipid and cholesterol levels. °Hepatitis C test. °Hepatitis B test. °HIV (human immunodeficiency virus) test. °STI (sexually transmitted infection) testing, if you are at  risk. °Lung cancer screening. °Colorectal cancer screening. °Diabetes screening. This is done by checking your blood sugar (glucose) after you have not eaten for a while (fasting). °Mammogram. Talk with your health care provider about how often you should have regular mammograms. °BRCA-related cancer screening. This may be done if you have a family history of breast, ovarian, tubal, or peritoneal cancers. °Bone density scan. This is done to screen for osteoporosis. °Talk with your health care provider about your test results, treatment options, and if necessary, the need for more tests. °Follow these instructions at home: °Eating and drinking ° °Eat a diet that includes fresh fruits and vegetables, whole grains, lean protein, and low-fat dairy products. Limit your intake of foods with high amounts of sugar, saturated fats, and salt. °Take vitamin and mineral supplements as recommended by your health care provider. °Do not drink alcohol if your health care provider tells you not to drink. °If you drink alcohol: °Limit how much you have to 0-1 drink a day. °Know how much alcohol is in your drink. In the [place] one drink equals one 12 oz bottle of beer (355 mL), one 5 oz glass of wine (148 mL), or one 1½ oz glass of hard liquor (44 mL). °Lifestyle °Brush your teeth every morning and night with fluoride toothpaste. Floss one time each day. °Exercise for at least 30 minutes 5 or more days each week. °Do not use any products that contain nicotine or tobacco. These products include cigarettes, chewing tobacco, and vaping devices, such as e-cigarettes. If you need help quitting, ask your health care provider. °Do not use drugs. °If you are sexually active, practice safe sex. Use a condom or other form of protection in order to prevent STIs. °Take aspirin only as told by your   health care provider. Make sure that you understand how much to take and what form to take. Work with your health care provider to find out whether it  is safe and beneficial for you to take aspirin daily. Ask your health care provider if you need to take a cholesterol-lowering medicine (statin). Find healthy ways to manage stress, such as: Meditation, yoga, or listening to music. Journaling. Talking to a trusted person. Spending time with friends and family. Minimize exposure to UV radiation to reduce your risk of skin cancer. Safety Always wear your seat belt while driving or riding in a vehicle. Do not drive: If you have been drinking alcohol. Do not ride with someone who has been drinking. When you are tired or distracted. While texting. If you have been using any mind-altering substances or drugs. Wear a helmet and other protective equipment during sports activities. If you have firearms in your house, make sure you follow all gun safety procedures. What's next? Visit your health care provider once a year for an annual wellness visit. Ask your health care provider how often you should have your eyes and teeth checked. Stay up to date on all vaccines. This information is not intended to replace advice given to you by your health care provider. Make sure you discuss any questions you have with your health care provider. Document Revised: 06/03/2021 Document Reviewed: 06/03/2021 Elsevier Patient Education  [place].

## 2022-01-01 ENCOUNTER — Telehealth: Payer: Self-pay | Admitting: Family Medicine

## 2022-01-01 NOTE — Telephone Encounter (Signed)
Copied from [id number]. Topic: General - Other >> Jan 01, 2022  7:41 AM Leward Quan A wrote: Reason for CRM: Patient called in to inquire of Dr Ancil Boozer if there is something that can be called in to the pharmacy for her throat since she have been having the sinus drainage and she feel the residue from it is still in there she cant cough it up. Also stated that the Ultrasound that was ordered they can not see her till 01/07/22 and she does not want to wait that long if there is something that Dr Ancil Boozer can do to get her seen sooner than that. Please call patient with answers at Ph# [phone]

## 2022-01-01 NOTE — Telephone Encounter (Signed)
Called pt and let her know to get mucinex DM, that there is no indication for antibiotics. I let her know she can call and get placed on cancellation list for a sooner appt. She stated she had their number already and will call. [place] Coratid ordered by Kathrine Haddock.

## 2022-01-07 ENCOUNTER — Ambulatory Visit
Admission: RE | Admit: 2022-01-07 | Discharge: 2022-01-07 | Disposition: A | Discharge status: Home / Self Care | Payer: Medicare Other | Source: Ambulatory Visit | Attending: Unknown Physician Specialty | Admitting: Unknown Physician Specialty

## 2022-01-07 ENCOUNTER — Other Ambulatory Visit: Payer: Self-pay

## 2022-01-07 DIAGNOSIS — I6523 Occlusion and stenosis of bilateral carotid arteries: Secondary | ICD-10-CM | POA: Diagnosis present

## 2022-01-11 ENCOUNTER — Ambulatory Visit (INDEPENDENT_AMBULATORY_CARE_PROVIDER_SITE_OTHER): Payer: Medicare Other | Admitting: Nurse Practitioner

## 2022-01-11 ENCOUNTER — Encounter: Payer: Self-pay | Admitting: Nurse Practitioner

## 2022-01-11 ENCOUNTER — Other Ambulatory Visit: Payer: Self-pay

## 2022-01-11 VITALS — BP 132/76 | HR 98 | Temp 98.3°F | Resp 18 | Ht 66.0 in | Wt 198.6 lb

## 2022-01-11 DIAGNOSIS — R221 Localized swelling, mass and lump, neck: Secondary | ICD-10-CM

## 2022-01-11 DIAGNOSIS — I6523 Occlusion and stenosis of bilateral carotid arteries: Secondary | ICD-10-CM

## 2022-01-11 DIAGNOSIS — H6502 Acute serous otitis media, left ear: Secondary | ICD-10-CM | POA: Diagnosis not present

## 2022-01-11 MED ORDER — AMOXICILLIN 875 MG PO TABS: 875.0000 mg | ORAL_TABLET | Freq: Two times a day (BID) | ORAL | 0 refills | Status: AC

## 2022-01-11 NOTE — Progress Notes (Signed)
BP 132/76    Pulse 98    Temp 98.3 F (36.8 C) (Oral)    Resp 18    Ht 5\' 6"  (1.676 m)    Wt 198 lb 9.6 oz (90.1 kg)    SpO2 99%    BMI 32.05 kg/m    Subjective:    Patient ID: Morgan Peterson, female    DOB: 1951-09-16, 71 y.o.   MRN: [id number]  HPI: Morgan Peterson is a 71 y.o. female  Chief Complaint  Patient presents with   Sinusitis   Ear Pain   Sinusitis/ Ear pain:  Left ear pain for about a week. She says she took tylenol when she had a temp of 100.7. She says her left ear has been hurting and she feels like her lymph nodes are swollen on the left side of her neck.  She says she also feels like there is something in her throat when she swallows.  She says it is not painful but sometimes she feels like something is in there.  Discussed following up with her ENT.  She is already established.  Left TM is red with effusion.  Will prescribe antibiotics since she has had it for a week.    Carotid atherosclerosis: found on Cspine x-ray, also on exam today she has a pulstaing mass on right side of neck. Carotic doppler was done and showed No evidence of hemodynamically significant stenosis involving either the right or left carotid circulation in the neck by Doppler criteria. Minimal plaque in the right carotid bulb results in less than 50% stenosis. No significant plaque appreciated in the left carotid circulation.  Patient still has a pulsating mass on the right side of her neck.  She says it just showed up one day after she coughed.  Sending referral for vascular.  Relevant past medical, surgical, family and social history reviewed and updated as indicated. Interim medical history since our last visit reviewed. Allergies and medications reviewed and updated.  Review of Systems  Constitutional: positive for fever, negative for weight change.  HEENT: positive for left ear pain Respiratory: Negative for cough and shortness of breath.   Cardiovascular: Negative for chest pain or  palpitations.  Gastrointestinal: Negative for abdominal pain, no bowel changes.  Musculoskeletal: Negative for gait problem or joint swelling.  Skin: Negative for rash.  Neurological: Negative for dizziness or headache.  No other specific complaints in a complete review of systems (except as listed in HPI above).      Objective:    BP 132/76    Pulse 98    Temp 98.3 F (36.8 C) (Oral)    Resp 18    Ht 5\' 6"  (1.676 m)    Wt 198 lb 9.6 oz (90.1 kg)    SpO2 99%    BMI 32.05 kg/m   Wt Readings from Last 3 Encounters:  01/11/22 198 lb 9.6 oz (90.1 kg)  12/31/21 201 lb (91.2 kg)  12/24/21 200 lb 6.4 oz (90.9 kg)    Physical Exam  Constitutional: Patient appears well-developed and well-nourished. Obese  No distress.  HEENT: head atraumatic, normocephalic, pupils equal and reactive to light, ears right TM clear, left TM red, effusion present, neck supple, pulsating mass on right side of neck,  throat within normal limits Cardiovascular: Normal rate, regular rhythm and normal heart sounds.  No murmur heard. No BLE edema. Pulmonary/Chest: Effort normal and breath sounds normal. No respiratory distress. Abdominal: Soft.  There is no tenderness. Psychiatric: Patient  has a normal mood and affect. behavior is normal. Judgment and thought content normal.   Results for orders placed or performed in visit on 12/31/21  Microalbumin, urine  Result Value Ref Range   Microalb, Ur Negative   Hemoglobin A1c  Result Value Ref Range   Hemoglobin A1C 6.6       Assessment & Plan:   1. Non-recurrent acute serous otitis media of left ear  - amoxicillin (AMOXIL) 875 MG tablet; Take 1 tablet (875 mg total) by mouth 2 (two) times daily for 7 days.  Dispense: 14 tablet; Refill: 0  2. Atherosclerosis of both carotid arteries  - Ambulatory referral to Vascular Surgery  3. Pulsatile neck mass  - Ambulatory referral to Vascular Surgery   Follow up plan: Return if symptoms worsen or fail to  improve.

## 2022-01-21 ENCOUNTER — Telehealth: Payer: Self-pay

## 2022-01-21 NOTE — Telephone Encounter (Signed)
Copied from [place] [id number]. Topic: General - Other >> Jan 21, 2022  3:11 PM Wynetta Emery, [place] C wrote: Reason for CRM: pt called in to request a call back from [place]. Pt says that she has a question for her.   CB: [phone]

## 2022-01-22 NOTE — Telephone Encounter (Signed)
Patient want info to ENT

## 2022-02-01 ENCOUNTER — Other Ambulatory Visit: Payer: Self-pay | Admitting: General Surgery

## 2022-02-01 DIAGNOSIS — Z853 Personal history of malignant neoplasm of breast: Secondary | ICD-10-CM

## 2022-02-03 LAB — HEMOGLOBIN A1C: Hemoglobin A1C: 6.8

## 2022-02-12 ENCOUNTER — Ambulatory Visit: Payer: Medicare Other | Admitting: Family Medicine

## 2022-03-18 ENCOUNTER — Other Ambulatory Visit: Payer: Self-pay | Admitting: General Surgery

## 2022-03-18 ENCOUNTER — Ambulatory Visit
Admission: RE | Admit: 2022-03-18 | Discharge: 2022-03-18 | Disposition: A | Discharge status: Home / Self Care | Payer: Medicare Other | Source: Ambulatory Visit | Attending: General Surgery | Admitting: General Surgery

## 2022-03-18 DIAGNOSIS — N6489 Other specified disorders of breast: Secondary | ICD-10-CM

## 2022-03-18 DIAGNOSIS — R928 Other abnormal and inconclusive findings on diagnostic imaging of breast: Secondary | ICD-10-CM

## 2022-03-18 DIAGNOSIS — Z853 Personal history of malignant neoplasm of breast: Secondary | ICD-10-CM | POA: Diagnosis present

## 2022-03-18 DIAGNOSIS — Z1231 Encounter for screening mammogram for malignant neoplasm of breast: Secondary | ICD-10-CM | POA: Diagnosis not present

## 2022-03-26 ENCOUNTER — Other Ambulatory Visit: Payer: Self-pay | Admitting: General Surgery

## 2022-03-26 NOTE — Progress Notes (Signed)
Subjective:  ?  ? Patient ID: Morgan Peterson is a 71 y.o. female. ?  ?HPI ?  ?The following portions of the patient's history were reviewed and updated as appropriate. ?  ?This an established patient is here today for: office visit. Here for followup mammogram, post right lumpectomy in 2010. She denies any new breast issues.  ?  ?She is also going to discuss having a colonoscopy, last in 2013. Bowels move about every 3 days, history IBS. Denies rectal bleeding or pain. ?  ?  ?Review of Systems  ?Constitutional: Negative for chills and fever.  ?Respiratory: Negative for cough.   ?Gastrointestinal: Positive for constipation. Negative for anal bleeding and diarrhea.  ?  ?  ?   ?Chief Complaint  ?Patient presents with  ? Follow-up  ?  ?  ?BP (!) 146/74   Pulse 70   Temp 36.3 ?C (97.4 ?F)   Ht 167.6 cm ('5\' 6"' )   Wt 91.2 kg (201 lb)   SpO2 98%   BMI 32.44 kg/m?  ?  ?    ?Past Medical History:  ?Diagnosis Date  ? Diabetes (CMS-HCC)    ? Hiatal hernia    ? History of radiation therapy    ? History of sinus problem    ? Hypercholesterolemia    ? Hypertension    ? Hypertriglyceridemia    ? IBS (irritable bowel syndrome)    ? Malignant neoplasm of left female breast (CMS-HCC) 04/11/2009  ?  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   90%, PR 15%, HER-2/neu 1+. Low Oncotype recurrence score. MammoSite radiation. Arimidex therapy completed September 2015  ?  ?  ?     ?Past Surgical History:  ?Procedure Laterality Date  ? abdominal exploratory surgery   2000  ? MASTECTOMY PARTIAL / LUMPECTOMY Right 2010  ? BREAST EXCISIONAL BIOPSY Right 2010  ? COLONOSCOPY   07/19/2012  ?  Dr. Bary Castilla  ? history of arthroscopy      ? LAPAROSCOPIC TUBAL LIGATION      ?  ?  ?  ?        ?OB History   ?  Gravida  ?2  ? Para  ?2  ? Term  ?   ? Preterm  ?   ? AB  ?   ? Living  ?   ?  ?  SAB  ?   ? IAB  ?   ? Ectopic  ?   ? Molar  ?   ? Multiple  ?   ? Live Births  ?   ?  ?  ?  Obstetric Comments  ?Age at first  period 35 ?Age of first pregnancy 84 ?Age of menopause 20  ?  ?   ?  ?  ?Social History  ?  ?  ?     ?Socioeconomic History  ? Marital status: Married  ?Tobacco Use  ? Smoking status: Former  ?    Packs/day: 0.50  ?    Years: 10.00  ?    Pack years: 5.00  ?    Types: Cigarettes  ?    Start date: 12/21/1987  ? Smokeless tobacco: Never  ?Vaping Use  ? Vaping Use: Never used  ?Substance and Sexual Activity  ? Alcohol use: Yes  ?    Comment: occ red wine  ? Drug use: Never  ? Sexual activity: Defer  ?  ?  ?  ?     ?  Allergies  ?Allergen Reactions  ? Codeine Shortness Of Breath  ? Levaquin [Levofloxacin] Anaphylaxis  ? Onglyza [Saxagliptin] Nausea and Palpitations  ? Amlodipine-Olmesartan Angioedema  ? Gabapentin Dizziness  ?    cns side effects.  ? Lisinopril Angioedema  ? Losartan Angioedema  ? Metoprolol Angioedema  ?    Lip and tongue tingling and numbness  ? Egg Unknown  ? Invokana [Canagliflozin] Other (See Comments)  ? Milk Other (See Comments)  ? Prednisone (Bulk) Unknown  ? Shrimp Other (See Comments)  ? Actos [Pioglitazone] Nausea, Other (See Comments) and Vomiting  ?    Bladder pain  ? Atorvastatin Abdominal Pain  ? Coreg [Carvedilol] Other (See Comments)  ?    NUMBNESS, TINGLING, ACHING IN ARMS  ? Glipizide Abdominal Pain  ?    Other reaction(s): Abdominal pain  ? Metformin Abdominal Pain  ? Tramadol Nausea  ?  ?  ?Current Medications  ?      ?Current Outpatient Medications  ?Medication Sig Dispense Refill  ? aspirin 81 MG chewable tablet Take 81 mg by mouth every other day         ? atenoloL (TENORMIN) 25 MG tablet Take 1 tablet (25 mg total) by mouth once daily 30 tablet 11  ? azelastine (ASTELIN) 137 mcg nasal spray Place 1 spray into both nostrils 2 (two) times daily      ? blood glucose diagnostic test strip OneTouch Verio strips      ? blood glucose diagnostic test strip Use 2 (two) times daily Use as instructed. 100 each 6  ? calcium carbonate-vitamin D3 200 mg (500 mg) -400 unit Cap Take by mouth      ?  cholecalciferol (VITAMIN D3) 1000 unit tablet Take 2,000 Units by mouth      ? cyanocobalamin (VITAMIN B12) 1000 MCG tablet Take 1,000 mcg by mouth twice a week DOSE UNKNOWN         ? EPINEPHrine (EPIPEN) 0.3 mg/0.3 mL auto-injector Inject into the muscle      ? famotidine (PEPCID) 40 MG tablet Take by mouth      ? lancets 33 gauge Misc OneTouch Delica Lancets 33 gauge      ? levocetirizine (XYZAL) 5 MG tablet Take 5 mg by mouth every evening      ? montelukast (SINGULAIR) 10 mg tablet Take 10 mg by mouth at bedtime      ? pen needle, diabetic 31 gauge x 5/16" needle BD Ultra-Fine Short Pen Needle 31 gauge x 5/16" ? USE AS DIRECTED.      ? rizatriptan (MAXALT) 10 MG tablet Take 10 mg by mouth as directed for Migraine May take a second dose after 2 hours if needed.      ? rosuvastatin (CRESTOR) 10 MG tablet Take 10 mg by mouth once daily      ? semaglutide (OZEMPIC) 0.25 mg or 0.5 mg(2 mg/1.5 mL) pen injector Inject 0.375 mLs (0.5 mg total) subcutaneously once a week 4.5 mL 4  ? triamterene-hydrochlorothiazide (MAXZIDE-25) 37.5-25 mg tablet Take 1 tablet by mouth once daily.      ? ergocalciferol, vitamin D2, 50,000 unit capsule ergocalciferol (vitamin D2) 50,000 unit capsule ? TAKE 1 CAPSULE BY MOUTH ONCE WEEKLY (Patient not taking: Reported on 02/03/2022)      ?  ?No current facility-administered medications for this visit.  ?  ?  ?  ?     ?Family History  ?Problem Relation Age of Onset  ? Diabetes Father    ?  Diabetes Brother    ? Diabetes Paternal Aunt    ? Osteoporosis (Thinning of bones) Mother    ? High blood pressure (Hypertension) Mother    ? Stroke Sister    ? Diabetes Sister    ? No Known Problems Maternal Grandmother    ? No Known Problems Maternal Grandfather    ? No Known Problems Paternal Grandmother    ? No Known Problems Paternal Grandfather    ? High blood pressure (Hypertension) Sister    ? Diabetes Sister    ? Breast cancer Maternal Aunt    ? Colon cancer Maternal Uncle    ? Cervical cancer  Maternal Aunt    ? Lung cancer Maternal Uncle    ?  ?  ?  ?Labs and Radiology:  ?  ?  ?Screening colonoscopy July 19, 2012: ?  ?Normal. ?  ?Bilateral screening mammograms dated March 18, 2022: ?  ?New focal density anterior to the wide excision site in the upper outer quadrant of the right breast.  New from 2022 and 2021 exams.  BI-RADS-0.  Additional views scheduled for April 12, 2022 ?  ?  ?   ?Objective:  ? Physical Exam ?Exam conducted with a chaperone present.  ?Constitutional:   ?   Appearance: Normal appearance.  ?Cardiovascular:  ?   Rate and Rhythm: Normal rate and regular rhythm.  ?   Pulses: Normal pulses.  ?   Heart sounds: Normal heart sounds.  ?Pulmonary:  ?   Effort: Pulmonary effort is normal.  ?   Breath sounds: Normal breath sounds.  ?Chest:  ?Breasts: ?   Left: Normal.  ? ? ?   Comments: Focal thickening in the intervening tissue between the wide excision and sentinel node biopsy site in the upper outer quadrant of the right breast.  New focal thickening inferior to the medial edge of the wide excision site. ?Musculoskeletal:  ?   Cervical back: Neck supple.  ?Lymphadenopathy:  ?   Upper Body:  ?   Right upper body: No supraclavicular or axillary adenopathy.  ?   Left upper body: No supraclavicular or axillary adenopathy.  ?Skin: ?   General: Skin is warm and dry.  ?Neurological:  ?   Mental Status: She is alert and oriented to person, place, and time.  ?Psychiatric:     ?   Mood and Affect: Mood normal.     ?   Behavior: Behavior normal.  ?  ?  ?  ?   ?Assessment:  ?   ?New focal thickening in the right breast with associated mammographic abnormality.  Additional views and ultrasound pending. ?  ?Candidate for screening colonoscopy. ?   ?Plan:  ?   ?We will review her additional views when completed. ?  ?Instructed in regards to colonoscopy by the staff (Cologuard offered, declined). ?  ?  ?   ?  ?This note is partially prepared by Karie Fetch, RN, acting as a scribe in the presence of Dr.  Hervey Ard, MD.  ?The documentation recorded by the scribe accurately reflects the service I personally performed and the decisions made by me.  ?  ?Robert Bellow, MD FACS ?

## 2022-04-12 ENCOUNTER — Ambulatory Visit
Admission: RE | Admit: 2022-04-12 | Discharge: 2022-04-12 | Disposition: A | Discharge status: Home / Self Care | Payer: Medicare Other | Source: Ambulatory Visit | Attending: General Surgery | Admitting: General Surgery

## 2022-04-12 ENCOUNTER — Other Ambulatory Visit: Payer: Self-pay | Admitting: General Surgery

## 2022-04-12 DIAGNOSIS — N6489 Other specified disorders of breast: Secondary | ICD-10-CM

## 2022-04-12 DIAGNOSIS — N63 Unspecified lump in unspecified breast: Secondary | ICD-10-CM

## 2022-04-12 DIAGNOSIS — R928 Other abnormal and inconclusive findings on diagnostic imaging of breast: Secondary | ICD-10-CM

## 2022-04-13 ENCOUNTER — Other Ambulatory Visit: Payer: Self-pay | Admitting: General Surgery

## 2022-04-15 ENCOUNTER — Other Ambulatory Visit: Payer: Self-pay | Admitting: General Surgery

## 2022-04-15 ENCOUNTER — Other Ambulatory Visit: Payer: Self-pay | Admitting: Pathology

## 2022-04-15 DIAGNOSIS — C50411 Malignant neoplasm of upper-outer quadrant of right female breast: Secondary | ICD-10-CM

## 2022-04-15 LAB — SURGICAL PATHOLOGY

## 2022-04-15 NOTE — Progress Notes (Signed)
Subjective:  ?  ? Patient ID: Morgan Peterson is a 71 y.o. female. ?  ?HPI ?  ?The following portions of the patient's history were reviewed and updated as appropriate. ?  ?This an established patient is here today for: office visit.  At the time of her office visit on April 6 additional views were pending on the right breast.  In the last week, she became aware of a approximately 7 mm nodule just below the base of the nipple in the right breast.  No history of trauma. ?  ?She has been contacted by phone with the additional view report from yesterday recommending 2 biopsies 1 the new density seen on mammography earlier (about 2 cm below the prior treatment site from her T1b, N0 cancer in April 2010) and a second area in the 6 o'clock position of the right breast.  Offered a biopsy with the [facility] staff or through the office, she chose the former.   ?  ?The patient is accompanied by her husband, [name].  ?  ?   ?Chief Complaint  ?Patient presents with  ? Procedure  ?  ?  ?BP (!) 169/85   Pulse 68   Temp 36.1 ?C (97 ?F)   Ht 167.6 cm (_0 )   Wt 91.2 kg (201 lb)   BMI 32.44 kg/m?  ?  ?    ?Past Medical History:  ?Diagnosis Date  ? Diabetes (CMS-HCC)    ? Hiatal hernia    ? History of radiation therapy    ? History of sinus problem    ? Hypercholesterolemia    ? Hypertension    ? Hypertriglyceridemia    ? IBS (irritable bowel syndrome)    ? Malignant neoplasm of left female breast (CMS-HCC) 04/11/2009  ?  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   90%, PR 15%, HER-2/neu 1+. Low Oncotype recurrence score. MammoSite radiation. Arimidex therapy completed September 2015  ?  ?  ?     ?Past Surgical History:  ?Procedure Laterality Date  ? abdominal exploratory surgery   2000  ? MASTECTOMY PARTIAL / LUMPECTOMY Right 2010  ? BREAST EXCISIONAL BIOPSY Right 2010  ? COLONOSCOPY   07/19/2012  ?  Dr. Bary Castilla  ? history of arthroscopy      ? LAPAROSCOPIC TUBAL LIGATION      ?  ?  ?  ?         ?OB History   ?  Gravida  ?2  ? Para  ?2  ? Term  ?   ? Preterm  ?   ? AB  ?   ? Living  ?   ?  ?  SAB  ?   ? IAB  ?   ? Ectopic  ?   ? Molar  ?   ? Multiple  ?   ? Live Births  ?   ?  ?  ?  Obstetric Comments  ?Age at first period 37 ?Age of first pregnancy 85 ?Age of menopause 46  ?  ?   ?  ?  ?Social History  ?  ?  ?     ?Socioeconomic History  ? Marital status: Married  ?Tobacco Use  ? Smoking status: Former  ?    Packs/day: 0.50  ?    Years: 10.00  ?    Pack years: 5.00  ?    Types: Cigarettes  ?    Start date: 12/21/1987  ?  Smokeless tobacco: Never  ?Vaping Use  ? Vaping Use: Never used  ?Substance and Sexual Activity  ? Alcohol use: Yes  ?    Comment: occ red wine  ? Drug use: Never  ? Sexual activity: Defer  ?  ?  ?  ?     ?Allergies  ?Allergen Reactions  ? Codeine Shortness Of Breath  ? Levaquin [Levofloxacin] Anaphylaxis  ? Onglyza [Saxagliptin] Nausea and Palpitations  ? Amlodipine-Olmesartan Angioedema  ? Gabapentin Dizziness  ?    cns side effects.  ? Lisinopril Angioedema  ? Losartan Angioedema  ? Metoprolol Angioedema  ?    Lip and tongue tingling and numbness  ? Egg Unknown  ? Invokana [Canagliflozin] Other (See Comments)  ? Milk Other (See Comments)  ? Prednisone (Bulk) Unknown  ? Shrimp Other (See Comments)  ? Actos [Pioglitazone] Nausea, Other (See Comments) and Vomiting  ?    Bladder pain  ? Atorvastatin Abdominal Pain  ? Coreg [Carvedilol] Other (See Comments)  ?    NUMBNESS, TINGLING, ACHING IN ARMS  ? Glipizide Abdominal Pain  ?    Other reaction(s): Abdominal pain  ? Metformin Abdominal Pain  ? Tramadol Nausea  ?  ?  ?Current Medications  ?      ?Current Outpatient Medications  ?Medication Sig Dispense Refill  ? aspirin 81 MG chewable tablet Take 81 mg by mouth every other day         ? atenoloL (TENORMIN) 25 MG tablet Take 1 tablet (25 mg total) by mouth once daily 30 tablet 11  ? azelastine (ASTELIN) 137 mcg nasal spray Place 1 spray into both nostrils 2 (two) times daily      ?  bisacodyL (DULCOLAX) 5 mg EC tablet Take two tablets morning and two tablets afternoon day prior to Miralax prep. 4 tablet 0  ? blood glucose diagnostic test strip OneTouch Verio strips      ? blood glucose diagnostic test strip Use 2 (two) times daily Use as instructed. 100 each 6  ? calcium carbonate-vitamin D3 200 mg (500 mg) -400 unit Cap Take by mouth      ? cholecalciferol (VITAMIN D3) 1000 unit tablet Take 2,000 Units by mouth      ? cyanocobalamin (VITAMIN B12) 1000 MCG tablet Take 1,000 mcg by mouth twice a week DOSE UNKNOWN         ? EPINEPHrine (EPIPEN) 0.3 mg/0.3 mL auto-injector Inject into the muscle      ? famotidine (PEPCID) 40 MG tablet Take by mouth      ? lancets 33 gauge Misc OneTouch Delica Lancets 33 gauge      ? levocetirizine (XYZAL) 5 MG tablet Take 5 mg by mouth every evening      ? montelukast (SINGULAIR) 10 mg tablet Take 10 mg by mouth at bedtime      ? pen needle, diabetic 31 gauge x 5/16" needle BD Ultra-Fine Short Pen Needle 31 gauge x 5/16" ? USE AS DIRECTED.      ? polyethylene glycol (MIRALAX) powder One bottle for colonoscopy prep. Use as directed. 255 g 0  ? rizatriptan (MAXALT) 10 MG tablet Take 10 mg by mouth as directed for Migraine May take a second dose after 2 hours if needed.      ? rosuvastatin (CRESTOR) 10 MG tablet Take 10 mg by mouth once daily      ? semaglutide (OZEMPIC) 0.25 mg or 0.5 mg(2 mg/1.5 mL) pen injector Inject 0.375 mLs (0.5 mg total)  subcutaneously once a week 4.5 mL 4  ? triamterene-hydrochlorothiazide (MAXZIDE-25) 37.5-25 mg tablet Take 1 tablet by mouth once daily.      ? ergocalciferol, vitamin D2, 50,000 unit capsule ergocalciferol (vitamin D2) 50,000 unit capsule ? TAKE 1 CAPSULE BY MOUTH ONCE WEEKLY (Patient not taking: Reported on 02/03/2022)      ?  ?No current facility-administered medications for this visit.  ?  ?  ?  ?     ?Family History  ?Problem Relation Age of Onset  ? Diabetes Father    ? Diabetes Brother    ? Diabetes Paternal Aunt    ?  Osteoporosis (Thinning of bones) Mother    ? High blood pressure (Hypertension) Mother    ? Stroke Sister    ? Diabetes Sister    ? No Known Problems Maternal Grandmother    ? No Known Problems Maternal Grandfather    ? No Known Problems Paternal Grandmother    ? No Known Problems Paternal Grandfather    ? High blood pressure (Hypertension) Sister    ? Diabetes Sister    ? Breast cancer Maternal Aunt    ? Colon cancer Maternal Uncle    ? Cervical cancer Maternal Aunt    ? Lung cancer Maternal Uncle    ?  ?  ?  ?Labs and Radiology:  ?  ?April 12, 2022 diagnostic right mammogram and ultrasound: ?  ?These images were independently reviewed.  The density in the upper outer quadrant of the breast below the prior treatment site persists and on ultrasound shows a 1.3 cm nodule.  At the 6 o'clock position there is a linear based structure of indeterminate significance. ?  ?April 13, 2022 ultrasound: The palpable area just below the edge of the nipple in the right breast at the 6 o'clock position, 1 cm from the nipple shows a taller rather than wide 0.4 x 0.44 x 0.63 cm mass without associated ductal dilatation. ?  ?At the 6 o'clock position there is an irregular transversely orientate softly lobulated hypoechoic area measuring 0.4 x 1.6 x 2.2 cm. ?  ?In light of the new findings in the retroareolar area it was elected to biopsy this as well as the dominant mass at 10:00. ?  ?Ultrasound-guided biopsy was reviewed with the patient and her husband and she was amenable to proceed. ?  ?Both sites were cleaned with ChloraPrep followed by a total of 20 cc of 0.5% Xylocaine with 0.25% Marcaine with 1: 200,000 units of epinephrine.  The smaller area in the retroareolar tissue was approached first.  Making use of a lateral approach a 14-gauge Bard spring-loaded device was used and 5 core samples obtained.  A ribbon clip was placed postbiopsy.  No bleeding. ?  ?Attention was then turned to the dominant mass at the 10 o'clock position.   A 12-gauge Elevate vacuum biopsy device was then used and approximately 8 core samples obtained under ultrasound guidance.  No bleeding.  Postbiopsy coil clip placed.  Skin defects were closed with benzoin and

## 2022-04-19 ENCOUNTER — Encounter
Admission: RE | Admit: 2022-04-19 | Discharge: 2022-04-19 | Disposition: A | Discharge status: Home / Self Care | Payer: Medicare Other | Source: Ambulatory Visit | Attending: General Surgery | Admitting: General Surgery

## 2022-04-19 ENCOUNTER — Encounter: Payer: Self-pay | Admitting: General Surgery

## 2022-04-19 VITALS — Ht 66.0 in | Wt 202.0 lb

## 2022-04-19 DIAGNOSIS — Z01812 Encounter for preprocedural laboratory examination: Secondary | ICD-10-CM

## 2022-04-19 DIAGNOSIS — E1159 Type 2 diabetes mellitus with other circulatory complications: Secondary | ICD-10-CM

## 2022-04-19 DIAGNOSIS — E1165 Type 2 diabetes mellitus with hyperglycemia: Secondary | ICD-10-CM

## 2022-04-19 HISTORY — DX: Pure hypercholesterolemia, unspecified: E78.00

## 2022-04-19 HISTORY — DX: Radiculopathy, cervical region: M54.12

## 2022-04-19 HISTORY — DX: Nonrheumatic mitral (valve) prolapse: I34.1

## 2022-04-19 HISTORY — DX: Hyperlipidemia, unspecified: E78.5

## 2022-04-19 NOTE — Patient Instructions (Addendum)
Your procedure is scheduled on: Monday, May 8 ?Report to the Registration Desk on the 1st floor of the [facility] at the time you were given (8:15 am) and you will be directed to the radiology department. ? ?REMEMBER: ?Instructions that are not followed completely may result in serious medical risk, up to and including death; or upon the discretion of your surgeon and anesthesiologist your surgery may need to be rescheduled. ? ?Do not eat food after midnight the night before surgery.  ?No gum chewing, lozengers or hard candies. ? ?You may however, drink water up to 2 hours before you are scheduled to arrive for your surgery. Do not drink anything within 2 hours of your scheduled arrival time. ? ?TAKE THESE MEDICATIONS THE MORNING OF SURGERY WITH A SIP OF WATER: ? ?Atenolol ?Omeprazole (Prilosec) - (take one the night before and one on the morning of surgery - helps to prevent nausea after surgery.) ?Rosuvastatin (Crestor) ? ?One week prior to surgery: starting May 1 ?Stop Anti-inflammatories (NSAIDS) such as Advil, Aleve, Ibuprofen, Motrin, Naproxen, Naprosyn and Aspirin based products such as Excedrin, Goodys Powder, BC Powder. ?Stop ANY OVER THE COUNTER supplements until after surgery. Stop multiple vitamins. ?You may however, continue to take Tylenol if needed for pain up until the day of surgery. ? ?No Alcohol for 24 hours before or after surgery. ? ?No Smoking including e-cigarettes for 24 hours prior to surgery.  ?No chewable tobacco products for at least 6 hours prior to surgery.  ?No nicotine patches on the day of surgery. ? ?Do not use any "recreational" drugs for at least a week prior to your surgery.  ?Please be advised that the combination of cocaine and anesthesia may have negative outcomes, up to and including death. ?If you test positive for cocaine, your surgery will be cancelled. ? ?On the morning of surgery brush your teeth with toothpaste and water, you may rinse your mouth with mouthwash if you  wish. ?Do not swallow any toothpaste or mouthwash. ? ?Use CHG Soap as directed on instruction sheet. ? ?Do not wear jewelry, make-up, hairpins, clips or nail polish. ? ?Do not wear lotions, powders, or perfumes.  ? ?Do not shave body from the neck down 48 hours prior to surgery just in case you cut yourself which could leave a site for infection.  ?Also, freshly shaved skin may become irritated if using the CHG soap. ? ?Contact lenses, hearing aids and dentures may not be worn into surgery. ? ?Do not bring valuables to the hospital. [facility] is not responsible for any missing/lost belongings or valuables.  ? ?Notify your doctor if there is any change in your medical condition (cold, fever, infection). ? ?Wear comfortable clothing (specific to your surgery type) to the hospital. ? ?After surgery, you can help prevent lung complications by doing breathing exercises.  ?Take deep breaths and cough every 1-2 hours. Your doctor may order a device called an Incentive Spirometer to help you take deep breaths. ? ?If you are being discharged the day of surgery, you will not be allowed to drive home. ?You will need a responsible adult (18 years or older) to drive you home and stay with you that night.  ? ?If you are taking public transportation, you will need to have a responsible adult (18 years or older) with you. ?Please confirm with your physician that it is acceptable to use public transportation.  ? ?Please call the [facility] at [phone] if you have any questions  about these instructions. ? ?Surgery Visitation Policy: ? ?Patients undergoing a surgery or procedure may have two family members or support persons with them as long as the person is not COVID-19 positive or experiencing its symptoms.  ?

## 2022-04-20 ENCOUNTER — Encounter: Payer: Self-pay | Admitting: General Surgery

## 2022-04-20 ENCOUNTER — Encounter: Payer: Self-pay | Admitting: Urgent Care

## 2022-04-20 ENCOUNTER — Encounter
Admission: RE | Admit: 2022-04-20 | Discharge: 2022-04-20 | Disposition: A | Discharge status: Home / Self Care | Payer: Medicare Other | Source: Ambulatory Visit | Attending: General Surgery | Admitting: General Surgery

## 2022-04-20 DIAGNOSIS — Z794 Long term (current) use of insulin: Secondary | ICD-10-CM

## 2022-04-20 DIAGNOSIS — E1159 Type 2 diabetes mellitus with other circulatory complications: Secondary | ICD-10-CM

## 2022-04-20 DIAGNOSIS — Z01812 Encounter for preprocedural laboratory examination: Secondary | ICD-10-CM

## 2022-04-20 DIAGNOSIS — E1165 Type 2 diabetes mellitus with hyperglycemia: Secondary | ICD-10-CM | POA: Insufficient documentation

## 2022-04-20 DIAGNOSIS — I152 Hypertension secondary to endocrine disorders: Secondary | ICD-10-CM | POA: Diagnosis not present

## 2022-04-20 LAB — BASIC METABOLIC PANEL
Anion gap: 9 (ref 5–15)
BUN: 9 mg/dL (ref 8–23)
CO2: 30 mmol/L (ref 22–32)
Calcium: 9.2 mg/dL (ref 8.9–10.3)
Chloride: 102 mmol/L (ref 98–111)
Creatinine, Ser: 0.83 mg/dL (ref 0.44–1.00)
GFR, Estimated: >60 mL/min (ref >60)
Glucose, Bld: 90 mg/dL (ref 70–99)
Potassium: 3.7 mmol/L (ref 3.5–5.1)
Sodium: 141 mmol/L (ref 135–145)

## 2022-04-20 LAB — CBC
HCT: 41 % (ref 36.0–46.0)
Hemoglobin: 12.2 g/dL (ref 12.0–15.0)
MCH: 26.6 pg (ref 26.0–34.0)
MCHC: 29.8 g/dL — ABNORMAL LOW (ref 30.0–36.0)
MCV: 89.3 fL (ref 80.0–100.0)
Platelets: 275 10*3/uL (ref 150–400)
RBC: 4.59 MIL/uL (ref 3.87–5.11)
RDW: 13.4 % (ref 11.5–15.5)
WBC: 4.8 10*3/uL (ref 4.0–10.5)
nRBC: 0 % (ref 0.0–0.2)

## 2022-04-21 ENCOUNTER — Encounter: Payer: Self-pay | Admitting: General Surgery

## 2022-04-21 ENCOUNTER — Ambulatory Visit
Admission: RE | Admit: 2022-04-21 | Discharge: 2022-04-21 | Disposition: A | Discharge status: Home / Self Care | Payer: Medicare Other | Attending: General Surgery | Admitting: General Surgery

## 2022-04-21 ENCOUNTER — Ambulatory Visit: Payer: Medicare Other | Admitting: Anesthesiology

## 2022-04-21 ENCOUNTER — Encounter
Admission: RE | Disposition: A | Discharge status: Home / Self Care | Payer: Self-pay | Source: Home / Self Care | Attending: General Surgery

## 2022-04-21 DIAGNOSIS — Z1211 Encounter for screening for malignant neoplasm of colon: Secondary | ICD-10-CM | POA: Diagnosis not present

## 2022-04-21 DIAGNOSIS — I10 Essential (primary) hypertension: Secondary | ICD-10-CM | POA: Diagnosis not present

## 2022-04-21 DIAGNOSIS — I6529 Occlusion and stenosis of unspecified carotid artery: Secondary | ICD-10-CM | POA: Insufficient documentation

## 2022-04-21 DIAGNOSIS — Z87891 Personal history of nicotine dependence: Secondary | ICD-10-CM | POA: Insufficient documentation

## 2022-04-21 DIAGNOSIS — E119 Type 2 diabetes mellitus without complications: Secondary | ICD-10-CM | POA: Diagnosis not present

## 2022-04-21 HISTORY — PX: COLONOSCOPY WITH PROPOFOL: SHX5780

## 2022-04-21 LAB — GLUCOSE, CAPILLARY: Glucose-Capillary: 101 mg/dL — ABNORMAL HIGH (ref 70–99)

## 2022-04-21 SURGERY — COLONOSCOPY WITH PROPOFOL
Anesthesia: General

## 2022-04-21 MED ORDER — PROPOFOL 500 MG/50ML IV EMUL
INTRAVENOUS | Status: DC | PRN
  Administered 2022-04-21: 200 ug/kg/min via INTRAVENOUS

## 2022-04-21 MED ORDER — PROPOFOL 10 MG/ML IV BOLUS
INTRAVENOUS | Status: DC | PRN
Start: 2022-04-21 — End: 2022-04-21
  Administered 2022-04-21: 60 mg via INTRAVENOUS

## 2022-04-21 MED ORDER — SODIUM CHLORIDE 0.9 % IV SOLN: INTRAVENOUS | Status: DC

## 2022-04-21 MED ORDER — PROPOFOL 500 MG/50ML IV EMUL
INTRAVENOUS | Status: AC
Start: 2022-04-21 — End: ?
  Filled 2022-04-21: qty 50

## 2022-04-21 MED ORDER — PHENYLEPHRINE HCL (PRESSORS) 10 MG/ML IV SOLN
INTRAVENOUS | Status: AC
  Filled 2022-04-21: qty 1

## 2022-04-21 NOTE — Anesthesia Postprocedure Evaluation (Signed)
Anesthesia Post Note ? ?Patient: Morgan Peterson ? ?Procedure(s) Performed: COLONOSCOPY WITH PROPOFOL ? ?Patient location during evaluation: PACU ?Anesthesia Type: General ?Level of consciousness: awake and oriented ?Pain management: pain level controlled ?Vital Signs Assessment: post-procedure vital signs reviewed and stable ?Respiratory status: spontaneous breathing and respiratory function stable ?Cardiovascular status: stable ?Anesthetic complications: no ? ? ?No notable events documented. ? ? ?Last Vitals:  ?Vitals:  ? 04/21/22 0903 04/21/22 0912  ?BP: 121/85 (!) 141/73  ?Pulse: (!) 59 60  ?Resp: 14 14  ?Temp:    ?SpO2: 100% 99%  ?  ?Last Pain:  ?Vitals:  ? 04/21/22 0840  ?TempSrc: Temporal  ?PainSc:   ? ? ?  ?  ?  ?  ?  ?  ? ?VAN STAVEREN,Shloimy Michalski ? ? ? ? ?

## 2022-04-21 NOTE — Transfer of Care (Signed)
Immediate Anesthesia Transfer of Care Note ? ?Patient: Morgan Peterson ? ?Procedure(s) Performed: COLONOSCOPY WITH PROPOFOL ? ?Patient Location: PACU ? ?Anesthesia Type:General ? ?Level of Consciousness: sedated ? ?Airway & Oxygen Therapy: Patient Spontanous Breathing and Patient connected to nasal cannula oxygen ? ?Post-op Assessment: Report given to RN and Post -op Vital signs reviewed and stable ? ?Post vital signs: Reviewed and stable ? ?Last Vitals:  ?Vitals Value Taken Time  ?BP 111/52 04/21/22 0850  ?Temp    ?Pulse 66 04/21/22 0851  ?Resp 18 04/21/22 0851  ?SpO2 96 % 04/21/22 0851  ?Vitals shown include unvalidated device data. ? ?Last Pain:  ?Vitals:  ? 04/21/22 0801  ?TempSrc: Temporal  ?PainSc: 0-No pain  ?   ? ?  ? ?Complications: No notable events documented. ?

## 2022-04-21 NOTE — Progress Notes (Deleted)
MRN : [id number]  Morgan Peterson is a 71 y.o. (1951-09-01) female who presents with chief complaint of check carotid arteries.  History of Present Illness:   The patient is seen for evaluation of carotid stenosis. The carotid stenosis was identified after ***.  The patient denies amaurosis fugax. There is no recent history of TIA symptoms or focal motor deficits. There is no prior documented CVA.  There is no history of migraine headaches. There is no history of seizures.  The patient is taking enteric-coated aspirin 81 mg daily.  No recent shortening of the patient's walking distance or new symptoms consistent with claudication.  No history of rest pain symptoms. No new ulcers or wounds of the lower extremities have occurred.  There is no history of DVT, PE or superficial thrombophlebitis. No recent episodes of angina or shortness of breath documented.    Carotid duplex dated 01/07/2022:  No evidence of hemodynamically significant stenosis involving either the right or left carotid circulation in the neck by Doppler criteria. Minimal plaque in the right carotid bulb results in less than 50% stenosis. No significant plaque appreciated in the left carotid circulation.  No outpatient medications have been marked as taking for the 04/22/22 encounter (Appointment) with Delana Meyer, Dolores Lory, MD.    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 Arimidex therapy completed September 2015   Mild mitral valve prolapse    Personal history of malignant neoplasm of breast 2010   Personal history of radiation therapy 2010   mammosite   T2DM (type 2  diabetes mellitus) ([place]) 2011    Past Surgical History:  Procedure Laterality Date   ABDOMINAL EXPLORATION SURGERY  2000   ovarian cyst   BREAST BIOPSY Right 2010   +    BREAST EXCISIONAL BIOPSY Right 2010   + mammo site inasive mammo ca   BREAST [facility]Y Right [facility]   COLONOSCOPY  07/19/2012   Normal exam, Dr. Bary Castilla   MASTECTOMY Right 2010   partial/ lumpectomy   TUBAL LIGATION  20 years ago    Social History Social History   Tobacco Use   Smoking status: Former    Packs/day: 0.25    Years: 10.00    Pack years: 2.50    Types: Cigarettes    Start date: 1978    Quit date: 1988    Years since quitting: 35.3   Smokeless tobacco: Never   Tobacco comments:    smoking cessation materials not required  Vaping Use   Vaping Use: Never used  Substance Use Topics   Alcohol use: Yes    Comment: wine occ   Drug use: No    Family History Family History  Problem Relation Age of Onset   Hypertension Mother    Hyperlipidemia Mother    Diabetes Father    Kidney disease Father    Diabetes Brother    Colon cancer Maternal Uncle    Breast cancer Maternal Aunt     Allergies  Allergen Reactions   Levaquin [Levofloxacin In D5w] Swelling    Other reaction(s): Arthralgia (Joint Pain)  Losartan     Other reaction(s): Angioedema   Metoprolol Swelling    Lip and tongue tingling and numbness   Amlodipine-Olmesartan    Canagliflozin Other (See Comments)    Bladder pain   Egg White [Egg White (Egg Protein)]     Unknown reaction   Gabapentin     cns side effects.   Glipizide     Other reaction(s): Abdominal pain   Gluten Meal    Lac Bovis    Lisinopril Swelling    Angioedema   Metformin And Related Nausea Only   Milk-Related Compounds    Onglyza [Saxagliptin] Other (See Comments)    Increased PVC's   Shrimp Extract Allergy Skin Test     Other reaction(s): Other (See Comments)   Tramadol Nausea And Vomiting   Actos [Pioglitazone] Other (See Comments) and  Nausea And Vomiting    Bladder pain   Atorvastatin Other (See Comments)    Other reaction(s): Abdominal Pain, Other (See Comments)   Carvedilol Other (See Comments)    NUMBNESS, TINGLING, ACHING IN ARMS   Prednisone Palpitations     REVIEW OF SYSTEMS (Negative unless checked)  Constitutional: '[]' Weight loss  '[]' Fever  '[]' Chills Cardiac: '[]' Chest pain   '[]' Chest pressure   '[]' Palpitations   '[]' Shortness of breath when laying flat   '[]' Shortness of breath with exertion. Vascular:  '[x]' Pain in legs with walking   '[]' Pain in legs at rest  '[]' History of DVT   '[]' Phlebitis   '[]' Swelling in legs   '[]' Varicose veins   '[]' Non-healing ulcers Pulmonary:   '[]' Uses home oxygen   '[]' Productive cough   '[]' Hemoptysis   '[]' Wheeze  '[]' COPD   '[]' Asthma Neurologic:  '[]' Dizziness   '[]' Seizures   '[]' History of stroke   '[]' History of TIA  '[]' Aphasia   '[]' Vissual changes   '[]' Weakness or numbness in arm   '[]' Weakness or numbness in leg Musculoskeletal:   '[]' Joint swelling   '[]' Joint pain   '[]' Low back pain Hematologic:  '[]' Easy bruising  '[]' Easy bleeding   '[]' Hypercoagulable state   '[]' Anemic Gastrointestinal:  '[]' Diarrhea   '[]' Vomiting  '[]' Gastroesophageal reflux/heartburn   '[]' Difficulty swallowing. Genitourinary:  '[]' Chronic kidney disease   '[]' Difficult urination  '[]' Frequent urination   '[]' Blood in urine Skin:  '[]' Rashes   '[]' Ulcers  Psychological:  '[]' History of anxiety   '[]'  History of major depression.  Physical Examination  There were no vitals filed for this visit. There is no height or weight on file to calculate BMI. Gen: WD/WN, NAD Head: [place]/AT, No temporalis wasting.  Ear/Nose/Throat: Hearing grossly intact, nares w/o erythema or drainage Eyes: PER, EOMI, sclera nonicteric.  Neck: Supple, no masses.  No bruit or JVD.  Pulmonary:  Good air movement, no audible wheezing, no use of accessory muscles.  Cardiac: RRR, normal S1, S2, no Murmurs. Vascular:  carotid bruit noted Vessel Right Left  Radial Palpable Palpable  Carotid  Palpable   Palpable  Subclav  Palpable Palpable  Gastrointestinal: soft, non-distended. No guarding/no peritoneal signs.  Musculoskeletal: M/S 5/5 throughout.  No visible deformity.  Neurologic: CN 2-12 intact. Pain and light touch intact in extremities.  Symmetrical.  Speech is fluent. Motor exam as listed above. Psychiatric: Judgment intact, Mood & affect appropriate for pt's clinical situation. Dermatologic: No rashes or ulcers noted.  No changes consistent with cellulitis.   CBC Lab Results  Component Value Date   WBC 4.8 04/20/2022   HGB 12.2 04/20/2022   HCT 41.0 04/20/2022   MCV 89.3 04/20/2022   PLT 275 04/20/2022    BMET  Component Value Date/Time   NA 141 04/20/2022 0913   NA 141 06/26/2019 0000   NA 137 03/24/2015 1348   K 3.7 04/20/2022 0913   K 3.5 03/24/2015 1348   CL 102 04/20/2022 0913   CL 101 03/24/2015 1348   CO2 30 04/20/2022 0913   CO2 31 03/24/2015 1348   GLUCOSE 90 04/20/2022 0913   GLUCOSE 162 (H) 03/24/2015 1348   BUN 9 04/20/2022 0913   BUN 9 06/26/2019 0000   BUN 11 03/24/2015 1348   CREATININE 0.83 04/20/2022 0913   CREATININE 0.81 12/31/2021 1005   CALCIUM 9.2 04/20/2022 0913   CALCIUM 8.7 (L) 03/24/2015 1348   GFRNONAA >60 04/20/2022 0913   GFRNONAA 74 10/09/2019 0000   GFRAA 85 10/09/2019 0000   Estimated Creatinine Clearance: 71.9 mL/min (by C-G formula based on SCr of 0.83 mg/dL).  COAG No results found for: INR, PROTIME  Radiology US BREAST LTD UNI RIGHT INC AXILLA  Result Date: 04/12/2022 CLINICAL DATA:  71 year old female recalled from screening mammogram dated 03/18/2022 for possible right breast asymmetries. EXAM: DIGITAL DIAGNOSTIC UNILATERAL RIGHT MAMMOGRAM WITH TOMOSYNTHESIS AND CAD; ULTRASOUND RIGHT BREAST LIMITED TECHNIQUE: Right digital diagnostic mammography and breast tomosynthesis was performed. The images were evaluated with computer-aided detection.; Targeted ultrasound examination of the right breast was performed  COMPARISON:  Previous exam(s). ACR Breast Density Category b: There are scattered areas of fibroglandular density. FINDINGS: There is a persistent irregular, hyperdense mass in the upper-outer quadrant of the right breast at posterior depth. A lobulated circumscribed mass with associated punctate calcifications that is demonstrated in the central right breast at mid depth. Further evaluation with ultrasound was performed. Targeted ultrasound is performed, showing an irregular, hypoechoic mass with mild associated vascularity at the 10 o'clock position 8 cm from the nipple. It measures 1.4 x 1.4 x 1.3 cm. This corresponds with the upper-outer mass. There is a linear, irregular multilobulated mass at the 6 o'clock position 4 cm from the nipple. Overall, it measures 1.2 x 0.6 x 2.8 cm. This corresponds with the additional mammographic finding. Evaluation of the right axilla demonstrates no suspicious lymphadenopathy. IMPRESSION: 1. Suspicious right breast mass at the 10 o'clock position 8 cm from the nipple. Recommend ultrasound-guided biopsy. 2. Indeterminate right breast mass at the 6 o'clock position 4 cm from the nipple. Recommend ultrasound-guided biopsy. 3. No suspicious right axillary lymphadenopathy. RECOMMENDATION: Two area ultrasound-guided biopsy of the right breast at the 10 and 6 o'clock positions. I have discussed the findings and recommendations with the patient. If applicable, a reminder letter will be sent to the patient regarding the next appointment. BI-RADS CATEGORY  4: Suspicious. Electronically Signed   By: Kristopher Oppenheim M.D.   On: 04/12/2022 09:43  MM DIAG BREAST TOMO UNI RIGHT  Result Date: 04/12/2022 CLINICAL DATA:  71 year old female recalled from screening mammogram dated 03/18/2022 for possible right breast asymmetries. EXAM: DIGITAL DIAGNOSTIC UNILATERAL RIGHT MAMMOGRAM WITH TOMOSYNTHESIS AND CAD; ULTRASOUND RIGHT BREAST LIMITED TECHNIQUE: Right digital diagnostic mammography and  breast tomosynthesis was performed. The images were evaluated with computer-aided detection.; Targeted ultrasound examination of the right breast was performed COMPARISON:  Previous exam(s). ACR Breast Density Category b: There are scattered areas of fibroglandular density. FINDINGS: There is a persistent irregular, hyperdense mass in the upper-outer quadrant of the right breast at posterior depth. A lobulated circumscribed mass with associated punctate calcifications that is demonstrated in the central right breast at mid depth. Further evaluation with ultrasound was performed. Targeted ultrasound is performed, showing  an irregular, hypoechoic mass with mild associated vascularity at the 10 o'clock position 8 cm from the nipple. It measures 1.4 x 1.4 x 1.3 cm. This corresponds with the upper-outer mass. There is a linear, irregular multilobulated mass at the 6 o'clock position 4 cm from the nipple. Overall, it measures 1.2 x 0.6 x 2.8 cm. This corresponds with the additional mammographic finding. Evaluation of the right axilla demonstrates no suspicious lymphadenopathy. IMPRESSION: 1. Suspicious right breast mass at the 10 o'clock position 8 cm from the nipple. Recommend ultrasound-guided biopsy. 2. Indeterminate right breast mass at the 6 o'clock position 4 cm from the nipple. Recommend ultrasound-guided biopsy. 3. No suspicious right axillary lymphadenopathy. RECOMMENDATION: Two area ultrasound-guided biopsy of the right breast at the 10 and 6 o'clock positions. I have discussed the findings and recommendations with the patient. If applicable, a reminder letter will be sent to the patient regarding the next appointment. BI-RADS CATEGORY  4: Suspicious. Electronically Signed   By: Kristopher Oppenheim M.D.   On: 04/12/2022 09:43    Assessment/Plan There are no diagnoses linked to this encounter.   Hortencia Pilar, MD  04/21/2022 12:00 PM

## 2022-04-21 NOTE — Anesthesia Procedure Notes (Signed)
Date/Time: 04/21/2022 8:35 AM ?Performed by: Nelda Marseille, CRNA ?Pre-anesthesia Checklist: Patient identified, Emergency Drugs available, Suction available, Patient being monitored and Timeout performed ?Oxygen Delivery Method: Nasal cannula ? ? ? ? ?

## 2022-04-21 NOTE — H&P (Signed)
Morgan Peterson ?[id number] ?04-Jul-1951 ? ?  ? ?HPI: Healthy woman for screening colonoscopy. Tolerated the prep well.  ? ?Medications Prior to Admission  ?Medication Sig Dispense Refill Last Dose  ? aspirin EC 81 MG tablet Take 1 tablet (81 mg total) by mouth daily. 30 tablet 0 04/21/2022 at 0700  ? atenolol (TENORMIN) 25 MG tablet Take 1 tablet (25 mg total) by mouth daily. 90 tablet 1 04/21/2022 at 0700  ? azelastine (ASTELIN) 0.1 % nasal spray Place 2 sprays into both nostrils daily as needed for allergies or rhinitis. Use in each nostril as directed   Past Week  ? cholecalciferol (VITAMIN D) 1000 UNITS tablet Take 1,000 Units by mouth daily.   Past Week  ? Cyanocobalamin (VITAMIN B-12 PO) Take 1 tablet by mouth daily as needed (fatigue).   Past Week  ? famotidine (PEPCID) 40 MG tablet Take 40 mg by mouth daily.   Past Week  ? fluocinonide (LIDEX) 0.05 % external solution Apply 1 application. topically daily.   Past Week  ? Multiple Vitamin (MULTIVITAMIN WITH MINERALS) TABS tablet Take 1 tablet by mouth 2 (two) times a week.   Past Week  ? omeprazole (PRILOSEC) 20 MG capsule Take 20 mg by mouth every morning.   Past Week  ? OZEMPIC, 0.25 OR 0.5 MG/DOSE, 2 MG/1.5ML SOPN Inject 0.5 mg into the skin every Sunday.   Past Week  ? rizatriptan (MAXALT-MLT) 10 MG disintegrating tablet Take 5-10 mg by mouth as needed for migraine. May repeat in 2 hours if needed   Past Week  ? rosuvastatin (CRESTOR) 10 MG tablet Take 1 tablet (10 mg total) by mouth daily. 90 tablet 1 04/21/2022 at 0700  ? triamterene-hydrochlorothiazide (MAXZIDE-25) 37.5-25 MG tablet Take 1 tablet by mouth daily. 90 tablet 1 Past Week  ? betamethasone, augmented, (DIPROLENE) 0.05 % lotion Apply 1 application topically 2 (two) times daily.     ? Blood Glucose Monitoring Suppl (ONETOUCH VERIO) w/Device KIT 1 Device by Does not apply route once. (Patient not taking: Reported on 01/11/2022) 1 kit 0   ? EPINEPHrine (EPIPEN 2-PAK) 0.3 mg/0.3 mL IJ SOAJ injection  Inject 0.3 mg into the muscle as needed for anaphylaxis. 2 each 0   ? glucose blood (ONETOUCH VERIO) test strip Use as instructed (Patient not taking: Reported on 01/11/2022) 100 each 12   ? Lancets (ONETOUCH ULTRASOFT) lancets Use as instructed (Patient not taking: Reported on 01/11/2022) 100 each 3   ? levocetirizine (XYZAL) 5 MG tablet Take 1 tablet (5 mg total) by mouth every evening. (Patient taking differently: Take 5 mg by mouth daily as needed for allergies.) 90 tablet 1   ? montelukast (SINGULAIR) 10 MG tablet Take 1 tablet (10 mg total) by mouth at bedtime. (Patient taking differently: Take 10 mg by mouth at bedtime as needed (allergies).) 90 tablet 1   ? ?Allergies  ?Allergen Reactions  ? Levaquin [Levofloxacin In D5w] Swelling  ?  Other reaction(s): Arthralgia (Joint Pain)  ? Losartan   ?  Other reaction(s): Angioedema  ? Metoprolol Swelling  ?  Lip and tongue tingling and numbness  ? Amlodipine-Olmesartan   ? Canagliflozin Other (See Comments)  ?  Bladder pain  ? Egg White [Egg White (Egg Protein)]   ?  Unknown reaction  ? Gabapentin   ?  cns side effects.  ? Glipizide   ?  Other reaction(s): Abdominal pain  ? Gluten Meal   ? Lac Bovis   ? Lisinopril Swelling  ?  Angioedema  ? Metformin And Related Nausea Only  ? Milk-Related Compounds   ? Onglyza [Saxagliptin] Other (See Comments)  ?  Increased PVC's  ? Shrimp Extract Allergy Skin Test   ?  Other reaction(s): Other (See Comments)  ? Tramadol Nausea And Vomiting  ? Actos [Pioglitazone] Other (See Comments) and Nausea And Vomiting  ?  Bladder pain  ? Atorvastatin Other (See Comments)  ?  Other reaction(s): Abdominal Pain, Other (See Comments)  ? Carvedilol Other (See Comments)  ?  NUMBNESS, TINGLING, ACHING IN ARMS  ? Prednisone Palpitations  ? ?Past Medical History:  ?Diagnosis Date  ? Cervical radiculopathy   ? Hiatal hernia   ? Hypercholesterolemia   ? Hyperlipidemia   ? Hypertension   ? IBS (irritable bowel syndrome)   ? Malignant neoplasm of  upper-outer quadrant of female breast (HCC) 04/11/2009  ? 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 Arimidex therapy completed September 2015  ? Mild mitral valve prolapse   ? Personal history of malignant neoplasm of breast 2010  ? Personal history of radiation therapy 2010  ? mammosite  ? T2DM (type 2 diabetes mellitus) (HCC) 2011  ? ?Past Surgical History:  ?Procedure Laterality Date  ? ABDOMINAL EXPLORATION SURGERY  2000  ? ovarian cyst  ? BREAST BIOPSY Right 2010  ? +   ? BREAST EXCISIONAL BIOPSY Right 2010  ? + mammo site inasive mammo ca  ? BREAST LUMPECTOMY Right 2010  ? [facility]  ? COLONOSCOPY  07/19/2012  ? Normal exam, Dr.   ? MASTECTOMY Right 2010  ? partial/ lumpectomy  ? TUBAL LIGATION  20 years ago  ? ?Social History  ? ?Socioeconomic History  ? Marital status: Married  ?  Spouse name: Antonio  ? Number of children: 2  ? Years of education: Not on file  ? Highest education level: Not on file  ?Occupational History  ? 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 Smoking status: Former  ?  Packs/day: 0.25  ?  Years: 10.00  ?  Pack years: 2.50  ?  Types: Cigarettes  ?  Start date: 1978  ?  Quit date: 1988  ?  Years since quitting: 35.3  ? Smokeless tobacco: Never  ? Tobacco comments:  ?  smoking cessation materials not required  ?Vaping Use  ? Vaping Use: Never used  ?Substance and Sexual Activity  ? Alcohol use: Yes  ?  Comment: wine occ  ? Drug use: No  ? Sexual activity: Not Currently  ?  Comment: husband has ED  ?Other Topics Concern  ? Not on file  ?Social History Narrative  ? Not on file  ? ?Social Determinants of Health  ? ?Financial Resource Strain: Low Risk   ? Difficulty of Paying Living Expenses: Not hard at all  ?Food Insecurity: No Food Insecurity  ? Worried About Running Out of Food in the Last Year: Never true  ? Ran Out of Food in the Last Year: Never true  ?Transportation  Needs: No Transportation Needs  ? Lack of Transportation (Medical): No  ? Lack of Transportation (Non-Medical): No  ?Physical Activity: Inactive  ? Days of Exercise per Week: 0 days  ? Minutes of Exercise per Session: 0 min  ?Stress: No Stress Concern Present  ? Feeling of Stress : Only a little  ?Social Connections: Moderately Integrated  ? Frequency   of Communication with Friends and Family: More than three times a week  ? Frequency of Social Gatherings with Friends and Family: Three times a week  ? Attends Religious Services: More than 4 times per year  ? Active Member of Clubs or Organizations: No  ? Attends Archivist Meetings: Never  ? Marital Status: Married  ?Intimate Partner Violence: Not At Risk  ? Fear of Current or Ex-Partner: No  ? Emotionally Abused: No  ? Physically Abused: No  ? Sexually Abused: No  ? ?Social History  ? ?Social History Narrative  ? Not on file  ? ? ? ?ROS: Negative.  ? ? ? ?PE: ?HEENT: Negative. ?Lungs: Clear. ?Cardio: RR. ? ? ?Assessment/Plan: ? ?Proceed with planned endoscopy.  ? ?Robert Bellow ?04/21/2022 ?  ?

## 2022-04-21 NOTE — Op Note (Signed)
[facility] ?Gastroenterology ?Patient Name: Morgan Peterson ?Procedure Date: 04/21/2022 6:54 AM ?MRN: [id number] ?Account #: [id number] ?Date of Birth: 1951-07-23 ?Admit Type: Outpatient ?Age: 71 ?Room: [facility] ENDO ROOM 1 ?Gender: Female ?Note Status: Finalized ?Instrument Name: Peds Colonoscope [id number] ?Procedure:             Colonoscopy ?Indications:           Screening for colorectal malignant neoplasm ?Providers:             Robert Bellow, MD ?Referring MD:          Bethena Roys. Ancil Boozer, MD (Referring MD) ?Medicines:             Propofol per Anesthesia ?Complications:         No immediate complications. ?Procedure:             Pre-Anesthesia Assessment: ?                       - Prior to the procedure, a History and Physical was  ?                       performed, and patient medications, allergies and  ?                       sensitivities were reviewed. The patient's tolerance  ?                       of previous anesthesia was reviewed. ?                       - The risks and benefits of the procedure and the  ?                       sedation options and risks were discussed with the  ?                       patient. All questions were answered and informed  ?                       consent was obtained. ?                       After obtaining informed consent, the colonoscope was  ?                       passed under direct vision. Throughout the procedure,  ?                       the patient's blood pressure, pulse, and oxygen  ?                       saturations were monitored continuously. The  ?                       Colonoscope was introduced through the anus and  ?                       advanced to the the cecum, identified by appendiceal  ?  orifice and ileocecal valve. The colonoscopy was  ?                       performed without difficulty. The patient tolerated  ?                       the procedure well. The quality of the bowel  ?                        preparation was excellent. ?Findings: ?     The entire examined colon appeared normal on direct and retroflexion  ?     views. ?Impression:            - The entire examined colon is normal on direct and  ?                       retroflexion views. ?                       - No specimens collected. ?Recommendation:        - Discharge patient to home (via wheelchair). ?Procedure Code(s):     --- Professional --- ?                       [id number], Colonoscopy, flexible; diagnostic, including  ?                       collection of specimen(s) by brushing or washing, when  ?                       performed (separate procedure) ?Diagnosis Code(s):     --- Professional --- ?                       Z12.11, Encounter for screening for malignant neoplasm  ?                       of colon ?CPT copyright 2019 American Medical Association. All rights reserved. ?The codes documented in this report are preliminary and upon coder review may  ?be revised to meet current compliance requirements. ?Robert Bellow, MD ?04/21/2022 8:47:54 AM ?This report has been signed electronically. ?Number of Addenda: 0 ?Note Initiated On: 04/21/2022 6:54 AM ?Scope Withdrawal Time: 0 hours 7 minutes 9 seconds  ?Total Procedure Duration: 0 hours 12 minutes 28 seconds  ?Estimated Blood Loss:  Estimated blood loss: none. ?     [facility] ?

## 2022-04-21 NOTE — Anesthesia Preprocedure Evaluation (Signed)
Anesthesia Evaluation  ?Patient identified by MRN, date of birth, ID band ?Patient awake ? ? ? ?Reviewed: ?Allergy & Precautions, NPO status , Patient's Chart, lab work & pertinent test results ? ?Airway ?Mallampati: II ? ?TM Distance: >3 FB ?Neck ROM: Full ? ? ? Dental ? ?(+) Teeth Intact ?  ?Pulmonary ?neg pulmonary ROS, former smoker,  ?  ?Pulmonary exam normal ? ?+ decreased breath sounds ? ? ? ? ? Cardiovascular ?Exercise Tolerance: Good ?hypertension, Pt. on medications ?negative cardio ROS ?Normal cardiovascular exam ?Rhythm:Regular Rate:Normal ? ? ?  ?Neuro/Psych ?negative neurological ROS ? negative psych ROS  ? GI/Hepatic ?negative GI ROS, Neg liver ROS, hiatal hernia,   ?Endo/Other  ?negative endocrine ROSdiabetes, Well Controlled, Type 2 ? Renal/GU ?negative Renal ROS  ?negative genitourinary ?  ?Musculoskeletal ?negative musculoskeletal ROS ?(+)  ? Abdominal ?Normal abdominal exam  (+)   ?Peds ?negative pediatric ROS ?(+)  Hematology ?negative hematology ROS ?(+)   ?Anesthesia Other Findings ?Past Medical History: ?No date: Cervical radiculopathy ?No date: Hiatal hernia ?No date: Hypercholesterolemia ?No date: Hyperlipidemia ?No date: Hypertension ?No date: IBS (irritable bowel syndrome) ?04/11/2009: Malignant neoplasm of upper-outer quadrant of female  ?breast ([place]) ?    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             grade, T1b, N0, M0 ER 90%, PR 15%, HER-2/neu 1+ low  ?             Oncotype and recurrent score. Arimidex therapy completed  ?             September 2015 ?No date: Mild mitral valve prolapse ?2010: Personal history of malignant neoplasm of breast ?2010: Personal history of radiation therapy ?    Comment:  mammosite ?2011: T2DM (type 2 diabetes mellitus) ([place]) ? ?Past Surgical History: ?2000: ABDOMINAL EXPLORATION SURGERY ?    Comment:  ovarian cyst ?2010: BREAST BIOPSY; Right ?    Comment:  +  ?2010: BREAST EXCISIONAL BIOPSY;  Right ?    Comment:  + mammo site inasive mammo ca ?2010: BREAST LUMPECTOMY; Right ?    Comment:  [facility] ?07/19/2012: COLONOSCOPY ?    Comment:  Normal exam, Dr. Bary Castilla ?2010: MASTECTOMY; Right ?    Comment:  partial/ lumpectomy ?20 years ago: TUBAL LIGATION ? ?BMI   ? Body Mass Index: 32.60 kg/m?  ?  ? ? Reproductive/Obstetrics ?negative OB ROS ? ?  ? ? ? ? ? ? ? ? ? ? ? ? ? ?  ?  ? ? ? ? ? ? ? ? ?Anesthesia Physical ?Anesthesia Plan ? ?ASA: 3 ? ?Anesthesia Plan: General  ? ?Post-op Pain Management:   ? ?Induction: Intravenous ? ?PONV Risk Score and Plan: Propofol infusion and TIVA ? ?Airway Management Planned: Natural Airway and Nasal Cannula ? ?Additional Equipment:  ? ?Intra-op Plan:  ? ?Post-operative Plan:  ? ?Informed Consent: I have reviewed the patients History and Physical, chart, labs and discussed the procedure including the risks, benefits and alternatives for the proposed anesthesia with the patient or authorized representative who has indicated his/her understanding and acceptance.  ? ? ? ?Dental Advisory Given ? ?Plan Discussed with: CRNA and Surgeon ? ?Anesthesia Plan Comments:   ? ? ? ? ? ? ?Anesthesia Quick Evaluation ? ?

## 2022-04-22 ENCOUNTER — Encounter (INDEPENDENT_AMBULATORY_CARE_PROVIDER_SITE_OTHER): Payer: PPO | Admitting: Vascular Surgery

## 2022-04-22 DIAGNOSIS — E1165 Type 2 diabetes mellitus with hyperglycemia: Secondary | ICD-10-CM

## 2022-04-22 DIAGNOSIS — I10 Essential (primary) hypertension: Secondary | ICD-10-CM

## 2022-04-22 DIAGNOSIS — I6523 Occlusion and stenosis of bilateral carotid arteries: Secondary | ICD-10-CM

## 2022-04-22 DIAGNOSIS — E1169 Type 2 diabetes mellitus with other specified complication: Secondary | ICD-10-CM

## 2022-04-26 ENCOUNTER — Other Ambulatory Visit: Payer: Self-pay

## 2022-04-26 ENCOUNTER — Encounter: Payer: Self-pay | Admitting: General Surgery

## 2022-04-26 ENCOUNTER — Ambulatory Visit
Admission: RE | Admit: 2022-04-26 | Discharge: 2022-04-26 | Disposition: A | Discharge status: Home / Self Care | Payer: Medicare Other | Attending: General Surgery | Admitting: General Surgery

## 2022-04-26 ENCOUNTER — Encounter
Admission: RE | Disposition: A | Discharge status: Home / Self Care | Payer: Self-pay | Source: Home / Self Care | Attending: General Surgery

## 2022-04-26 ENCOUNTER — Ambulatory Visit: Payer: Medicare Other | Admitting: Urgent Care

## 2022-04-26 ENCOUNTER — Ambulatory Visit
Admission: RE | Admit: 2022-04-26 | Discharge: 2022-04-26 | Disposition: A | Discharge status: Home / Self Care | Payer: Medicare Other | Source: Ambulatory Visit | Attending: General Surgery | Admitting: General Surgery

## 2022-04-26 DIAGNOSIS — N6341 Unspecified lump in right breast, subareolar: Secondary | ICD-10-CM | POA: Diagnosis present

## 2022-04-26 DIAGNOSIS — I10 Essential (primary) hypertension: Secondary | ICD-10-CM | POA: Insufficient documentation

## 2022-04-26 DIAGNOSIS — E119 Type 2 diabetes mellitus without complications: Secondary | ICD-10-CM | POA: Diagnosis not present

## 2022-04-26 DIAGNOSIS — Z87891 Personal history of nicotine dependence: Secondary | ICD-10-CM | POA: Insufficient documentation

## 2022-04-26 DIAGNOSIS — Z171 Estrogen receptor negative status [ER-]: Secondary | ICD-10-CM | POA: Insufficient documentation

## 2022-04-26 DIAGNOSIS — Z853 Personal history of malignant neoplasm of breast: Secondary | ICD-10-CM | POA: Diagnosis not present

## 2022-04-26 DIAGNOSIS — C50411 Malignant neoplasm of upper-outer quadrant of right female breast: Secondary | ICD-10-CM

## 2022-04-26 DIAGNOSIS — Z923 Personal history of irradiation: Secondary | ICD-10-CM | POA: Insufficient documentation

## 2022-04-26 DIAGNOSIS — L821 Other seborrheic keratosis: Secondary | ICD-10-CM | POA: Diagnosis not present

## 2022-04-26 DIAGNOSIS — E785 Hyperlipidemia, unspecified: Secondary | ICD-10-CM | POA: Insufficient documentation

## 2022-04-26 HISTORY — PX: SIMPLE MASTECTOMY WITH AXILLARY SENTINEL NODE BIOPSY: SHX6098

## 2022-04-26 LAB — GLUCOSE, CAPILLARY
Glucose-Capillary: 114 mg/dL — ABNORMAL HIGH (ref 70–99)
Glucose-Capillary: 121 mg/dL — ABNORMAL HIGH (ref 70–99)

## 2022-04-26 SURGERY — SIMPLE MASTECTOMY WITH AXILLARY SENTINEL NODE BIOPSY
Anesthesia: General | Site: Breast | Laterality: Right

## 2022-04-26 MED ORDER — STERILE WATER FOR IRRIGATION IR SOLN
Status: DC | PRN
  Administered 2022-04-26: 1000 mL

## 2022-04-26 MED ORDER — SODIUM CHLORIDE 0.9 % IV SOLN
INTRAVENOUS | Status: DC
Start: 2022-04-26 — End: 2022-04-26

## 2022-04-26 MED ORDER — DEXAMETHASONE SODIUM PHOSPHATE 10 MG/ML IJ SOLN
INTRAMUSCULAR | Status: DC | PRN
  Administered 2022-04-26: 10 mg via INTRAVENOUS

## 2022-04-26 MED ORDER — PROPOFOL 10 MG/ML IV BOLUS
INTRAVENOUS | Status: AC
  Filled 2022-04-26: qty 40

## 2022-04-26 MED ORDER — ONDANSETRON HCL 4 MG/2ML IJ SOLN
INTRAMUSCULAR | Status: AC
  Filled 2022-04-26: qty 2

## 2022-04-26 MED ORDER — METHYLENE BLUE 1 % INJ SOLN
INTRAVENOUS | Status: DC | PRN
Start: 2022-04-26 — End: 2022-04-26
  Administered 2022-04-26: 5 mL via SUBMUCOSAL

## 2022-04-26 MED ORDER — FENTANYL CITRATE (PF) 100 MCG/2ML IJ SOLN
INTRAMUSCULAR | Status: AC
Start: 2022-04-26 — End: ?
  Filled 2022-04-26: qty 2

## 2022-04-26 MED ORDER — PROPOFOL 10 MG/ML IV BOLUS
INTRAVENOUS | Status: DC | PRN
  Administered 2022-04-26: 150 mg via INTRAVENOUS

## 2022-04-26 MED ORDER — CHLORHEXIDINE GLUCONATE CLOTH 2 % EX PADS
6.0000 | MEDICATED_PAD | Freq: Once | CUTANEOUS | Status: AC
  Administered 2022-04-26: 6 via TOPICAL

## 2022-04-26 MED ORDER — CHLORHEXIDINE GLUCONATE 0.12 % MT SOLN
15.0000 mL | Freq: Once | OROMUCOSAL | Status: AC
  Administered 2022-04-26: 15 mL via OROMUCOSAL

## 2022-04-26 MED ORDER — SODIUM CHLORIDE 0.9 % IV SOLN: INTRAVENOUS | Status: DC | PRN

## 2022-04-26 MED ORDER — METHYLENE BLUE 1 % INJ SOLN
INTRAVENOUS | Status: AC
  Filled 2022-04-26: qty 10

## 2022-04-26 MED ORDER — CEFAZOLIN SODIUM-DEXTROSE 2-4 GM/100ML-% IV SOLN
INTRAVENOUS | Status: AC
  Filled 2022-04-26: qty 100

## 2022-04-26 MED ORDER — ACETAMINOPHEN 10 MG/ML IV SOLN
INTRAVENOUS | Status: AC
  Filled 2022-04-26: qty 100

## 2022-04-26 MED ORDER — PHENYLEPHRINE HCL-NACL 20-0.9 MG/250ML-% IV SOLN
INTRAVENOUS | Status: DC | PRN
  Administered 2022-04-26: 30 ug/min via INTRAVENOUS

## 2022-04-26 MED ORDER — ORAL CARE MOUTH RINSE: 15.0000 mL | Freq: Once | OROMUCOSAL | Status: AC

## 2022-04-26 MED ORDER — KETOROLAC TROMETHAMINE 30 MG/ML IJ SOLN
INTRAMUSCULAR | Status: DC | PRN
Start: 2022-04-26 — End: 2022-04-26
  Administered 2022-04-26: 15 mg via INTRAVENOUS

## 2022-04-26 MED ORDER — ACETAMINOPHEN 10 MG/ML IV SOLN
INTRAVENOUS | Status: DC | PRN
  Administered 2022-04-26: 1000 mg via INTRAVENOUS

## 2022-04-26 MED ORDER — MIDAZOLAM HCL 2 MG/2ML IJ SOLN
INTRAMUSCULAR | Status: AC
Start: 2022-04-26 — End: ?
  Filled 2022-04-26: qty 2

## 2022-04-26 MED ORDER — MIDAZOLAM HCL 2 MG/2ML IJ SOLN
INTRAMUSCULAR | Status: DC | PRN
  Administered 2022-04-26: 1 mg via INTRAVENOUS

## 2022-04-26 MED ORDER — PROPOFOL 500 MG/50ML IV EMUL
INTRAVENOUS | Status: DC | PRN
  Administered 2022-04-26: 25 ug/kg/min via INTRAVENOUS

## 2022-04-26 MED ORDER — DEXMEDETOMIDINE (PRECEDEX) IN NS 20 MCG/5ML (4 MCG/ML) IV SYRINGE
PREFILLED_SYRINGE | INTRAVENOUS | Status: DC | PRN
  Administered 2022-04-26: 4 ug via INTRAVENOUS
  Administered 2022-04-26: 10 ug via INTRAVENOUS

## 2022-04-26 MED ORDER — PHENYLEPHRINE 80 MCG/ML (10ML) SYRINGE FOR IV PUSH (FOR BLOOD PRESSURE SUPPORT)
PREFILLED_SYRINGE | INTRAVENOUS | Status: DC | PRN
  Administered 2022-04-26: 160 ug via INTRAVENOUS

## 2022-04-26 MED ORDER — PROPOFOL 10 MG/ML IV BOLUS
INTRAVENOUS | Status: AC
  Filled 2022-04-26: qty 20

## 2022-04-26 MED ORDER — FENTANYL CITRATE (PF) 100 MCG/2ML IJ SOLN
INTRAMUSCULAR | Status: AC
  Administered 2022-04-26: 50 ug via INTRAVENOUS
  Filled 2022-04-26: qty 2

## 2022-04-26 MED ORDER — HYDROCODONE-ACETAMINOPHEN 5-325 MG PO TABS: 1.0000 | ORAL_TABLET | ORAL | 0 refills | Status: DC | PRN

## 2022-04-26 MED ORDER — FENTANYL CITRATE (PF) 100 MCG/2ML IJ SOLN
INTRAMUSCULAR | Status: DC | PRN
Start: 2022-04-26 — End: 2022-04-26
  Administered 2022-04-26 (×6): 25 ug via INTRAVENOUS

## 2022-04-26 MED ORDER — ONDANSETRON HCL 4 MG/2ML IJ SOLN
INTRAMUSCULAR | Status: DC | PRN
  Administered 2022-04-26: 4 mg via INTRAVENOUS

## 2022-04-26 MED ORDER — TECHNETIUM TC 99M TILMANOCEPT KIT
1.0000 | PACK | Freq: Once | INTRAVENOUS | Status: AC | PRN
  Administered 2022-04-26: 1.11 via INTRADERMAL

## 2022-04-26 MED ORDER — CEFAZOLIN SODIUM-DEXTROSE 2-4 GM/100ML-% IV SOLN
2.0000 g | INTRAVENOUS | Status: AC
  Administered 2022-04-26: 2 g via INTRAVENOUS

## 2022-04-26 MED ORDER — LIDOCAINE HCL (PF) 2 % IJ SOLN
INTRAMUSCULAR | Status: AC
Start: 2022-04-26 — End: ?
  Filled 2022-04-26: qty 5

## 2022-04-26 MED ORDER — DEXAMETHASONE SODIUM PHOSPHATE 10 MG/ML IJ SOLN
INTRAMUSCULAR | Status: AC
  Filled 2022-04-26: qty 1

## 2022-04-26 MED ORDER — FENTANYL CITRATE (PF) 100 MCG/2ML IJ SOLN
25.0000 ug | INTRAMUSCULAR | Status: DC | PRN
  Administered 2022-04-26: 50 ug via INTRAVENOUS

## 2022-04-26 MED ORDER — ONDANSETRON HCL 4 MG/2ML IJ SOLN
4.0000 mg | Freq: Once | INTRAMUSCULAR | Status: AC | PRN
  Administered 2022-04-26: 4 mg via INTRAVENOUS

## 2022-04-26 MED ORDER — LIDOCAINE HCL (CARDIAC) PF 100 MG/5ML IV SOSY
PREFILLED_SYRINGE | INTRAVENOUS | Status: DC | PRN
  Administered 2022-04-26: 80 mg via INTRAVENOUS

## 2022-04-26 SURGICAL SUPPLY — 56 items
APL PRP STRL LF DISP 70% ISPRP (MISCELLANEOUS) ×1
APPLIER CLIP 11 MED OPEN (CLIP)
APPLIER CLIP 13 LRG OPEN (CLIP)
APR CLP LRG 13 20 CLIP (CLIP)
APR CLP MED 11 20 MLT OPN (CLIP)
BINDER BREAST XLRG (GAUZE/BANDAGES/DRESSINGS) ×1 IMPLANT
BLADE PHOTON ILLUMINATED (MISCELLANEOUS) ×1 IMPLANT
BLADE SURG 15 STRL SS SAFETY (BLADE) ×2 IMPLANT
BULB RESERV EVAC DRAIN JP 100C (MISCELLANEOUS) IMPLANT
CHLORAPREP W/TINT 26 (MISCELLANEOUS) ×2 IMPLANT
CLIP APPLIE 11 MED OPEN (CLIP) IMPLANT
CLIP APPLIE 13 LRG OPEN (CLIP) IMPLANT
CNTNR SPEC 2.5X3XGRAD LEK (MISCELLANEOUS) ×3
CONT SPEC 4OZ STER OR WHT (MISCELLANEOUS) ×3
CONT SPEC 4OZ STRL OR WHT (MISCELLANEOUS) ×3
CONTAINER SPEC 2.5X3XGRAD LEK (MISCELLANEOUS) ×3 IMPLANT
DRAIN CHANNEL JP 15F RND 16 (MISCELLANEOUS) IMPLANT
DRAPE LAPAROTOMY TRNSV 106X77 (MISCELLANEOUS) ×2 IMPLANT
DRSG GAUZE FLUFF 36X18 (GAUZE/BANDAGES/DRESSINGS) ×2 IMPLANT
DRSG TELFA 3X8 NADH (GAUZE/BANDAGES/DRESSINGS) ×2 IMPLANT
ELECT CAUTERY BLADE TIP 2.5 (TIP) ×2
ELECT REM PT RETURN 9FT ADLT (ELECTROSURGICAL) ×2
ELECTRODE CAUTERY BLDE TIP 2.5 (TIP) ×1 IMPLANT
ELECTRODE REM PT RTRN 9FT ADLT (ELECTROSURGICAL) ×1 IMPLANT
GAUZE 4X4 16PLY ~~LOC~~+RFID DBL (SPONGE) ×2 IMPLANT
GLOVE SURG ENC MOIS LTX SZ7.5 (GLOVE) ×2 IMPLANT
GLOVE SURG UNDER LTX SZ8 (GLOVE) ×2 IMPLANT
GOWN STRL REUS W/ TWL LRG LVL3 (GOWN DISPOSABLE) ×2 IMPLANT
GOWN STRL REUS W/TWL LRG LVL3 (GOWN DISPOSABLE) ×4
LABEL OR SOLS (LABEL) ×2 IMPLANT
MANIFOLD NEPTUNE II (INSTRUMENTS) ×2 IMPLANT
PACK BASIN MINOR ARMC (MISCELLANEOUS) ×2 IMPLANT
PAD DRESSING TELFA 3X8 NADH (GAUZE/BANDAGES/DRESSINGS) ×1 IMPLANT
PENCIL ELECTRO HAND CTR (MISCELLANEOUS) ×2 IMPLANT
PIN SAFETY STRL (MISCELLANEOUS) ×2 IMPLANT
RETRACTOR RING XSMALL (MISCELLANEOUS) IMPLANT
RTRCTR WOUND ALEXIS 13CM XS SH (MISCELLANEOUS)
SHEARS FOC LG CVD HARMONIC 17C (MISCELLANEOUS) IMPLANT
SLEVE PROBE SENORX GAMMA FIND (MISCELLANEOUS) ×2 IMPLANT
SPONGE T-LAP 18X18 ~~LOC~~+RFID (SPONGE) ×2 IMPLANT
STRIP CLOSURE SKIN 1/2X4 (GAUZE/BANDAGES/DRESSINGS) ×4 IMPLANT
SUT ETHILON 3-0 FS-10 30 BLK (SUTURE) ×2
SUT SILK 2 0 (SUTURE) ×2
SUT SILK 2-0 30XBRD TIE 12 (SUTURE) ×1 IMPLANT
SUT SILK 3 0 (SUTURE) ×2
SUT SILK 3-0 18XBRD TIE 12 (SUTURE) ×1 IMPLANT
SUT VIC AB 2-0 CT1 27 (SUTURE) ×2
SUT VIC AB 2-0 CT1 TAPERPNT 27 (SUTURE) ×4 IMPLANT
SUT VIC AB 3-0 54X BRD REEL (SUTURE) ×2 IMPLANT
SUT VIC AB 3-0 BRD 54 (SUTURE) ×4
SUT VIC AB 3-0 SH 27 (SUTURE) ×2
SUT VIC AB 3-0 SH 27X BRD (SUTURE) ×1 IMPLANT
SUTURE EHLN 3-0 FS-10 30 BLK (SUTURE) ×1 IMPLANT
SWABSTK COMLB BENZOIN TINCTURE (MISCELLANEOUS) ×2 IMPLANT
TAPE TRANSPORE STRL 2 31045 (GAUZE/BANDAGES/DRESSINGS) ×2 IMPLANT
WATER STERILE IRR 500ML POUR (IV SOLUTION) ×2 IMPLANT

## 2022-04-26 NOTE — H&P (Signed)
Morgan Peterson ?[id number] ?[birth year] ? ?  ? ?HPI:  13 years s/p treatment of initial RUOQ breast cancer. 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 For mastectomy. Tolerated SLN injection with minimal discomfort.  ? ?Medications Prior to Admission  ?Medication Sig Dispense Refill Last Dose  ? aspirin EC 81 MG tablet Take 1 tablet (81 mg total) by mouth daily. 30 tablet 0 04/25/2022  ? atenolol (TENORMIN) 25 MG tablet Take 1 tablet (25 mg total) by mouth daily. 90 tablet 1 04/26/2022  ? azelastine (ASTELIN) 0.1 % nasal spray Place 2 sprays into both nostrils daily as needed for allergies or rhinitis. Use in each nostril as directed   Past Week  ? betamethasone, augmented, (DIPROLENE) 0.05 % lotion Apply 1 application topically 2 (two) times daily.   Past Month  ? Blood Glucose Monitoring Suppl (ONETOUCH VERIO) w/Device KIT 1 Device by Does not apply route once. 1 kit 0 04/26/2022  ? cholecalciferol (VITAMIN D) 1000 UNITS tablet Take 1,000 Units by mouth daily.   Past Week  ? Cyanocobalamin (VITAMIN B-12 PO) Take 1 tablet by mouth daily as needed (fatigue).   Past Week  ? famotidine (PEPCID) 40 MG tablet Take 40 mg by mouth daily.   04/26/2022  ? fluocinonide (LIDEX) 0.05 % external solution Apply 1 application. topically daily.   Past Week  ? glucose blood (ONETOUCH VERIO) test strip Use as instructed 100 each 12 04/26/2022  ? Lancets (ONETOUCH ULTRASOFT) lancets Use as instructed 100 each 3 04/26/2022  ? levocetirizine (XYZAL) 5 MG tablet Take 1 tablet (5 mg total) by mouth every evening. (Patient taking differently: Take 5 mg by mouth daily as needed for allergies.) 90 tablet 1 Past Week  ? montelukast (SINGULAIR) 10 MG tablet Take 1 tablet (10 mg total) by mouth at bedtime. (Patient taking differently: Take 10 mg by mouth at bedtime as needed (allergies).) 90 tablet 1 04/25/2022  ? Multiple Vitamin (MULTIVITAMIN WITH MINERALS) TABS tablet Take 1 tablet by mouth 2 (two) times a week.   Past Week  ? omeprazole (PRILOSEC) 20  MG capsule Take 20 mg by mouth every morning.   04/26/2022  ? OZEMPIC, 0.25 OR 0.5 MG/DOSE, 2 MG/1.5ML SOPN Inject 0.5 mg into the skin every Sunday.   Past Week  ? rizatriptan (MAXALT-MLT) 10 MG disintegrating tablet Take 5-10 mg by mouth as needed for migraine. May repeat in 2 hours if needed   Past Week  ? rosuvastatin (CRESTOR) 10 MG tablet Take 1 tablet (10 mg total) by mouth daily. 90 tablet 1 04/25/2022  ? triamterene-hydrochlorothiazide (MAXZIDE-25) 37.5-25 MG tablet Take 1 tablet by mouth daily. 90 tablet 1 04/25/2022  ? EPINEPHrine (EPIPEN 2-PAK) 0.3 mg/0.3 mL IJ SOAJ injection Inject 0.3 mg into the muscle as needed for anaphylaxis. (Patient not taking: Reported on 04/26/2022) 2 each 0 Not Taking  ? ?Allergies  ?Allergen Reactions  ? Levaquin [Levofloxacin In D5w] Swelling  ?  Other reaction(s): Arthralgia (Joint Pain)  ? Losartan   ?  Other reaction(s): Angioedema  ? Metoprolol Swelling  ?  Lip and tongue tingling and numbness  ? Amlodipine-Olmesartan   ? Canagliflozin Other (See Comments)  ?  Bladder pain  ? Egg White [Egg White (Egg Protein)]   ?  Unknown reaction  ? Gabapentin   ?  cns side effects.  ? Glipizide   ?  Other reaction(s): Abdominal pain  ? Gluten Meal   ? Lac Bovis   ? Lisinopril Swelling  ?  Angioedema  ?  Metformin And Related Nausea Only  ? Milk-Related Compounds   ? Onglyza [Saxagliptin] Other (See Comments)  ?  Increased PVC's  ? Shrimp Extract Allergy Skin Test   ?  Other reaction(s): Other (See Comments)  ? Tramadol Nausea And Vomiting  ? Actos [Pioglitazone] Other (See Comments) and Nausea And Vomiting  ?  Bladder pain  ? Atorvastatin Other (See Comments)  ?  Other reaction(s): Abdominal Pain, Other (See Comments)  ? Carvedilol Other (See Comments)  ?  NUMBNESS, TINGLING, ACHING IN ARMS  ? Prednisone Palpitations  ? ?Past Medical History:  ?Diagnosis Date  ? Cervical radiculopathy   ? Hiatal hernia   ? Hypercholesterolemia   ? Hyperlipidemia   ? Hypertension   ? IBS (irritable bowel  syndrome)   ? Malignant neoplasm of upper-outer quadrant of female breast ([place]) 04/11/2009  ? 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 Arimidex therapy completed September 2015  ? Mild mitral valve prolapse   ? Personal history of malignant neoplasm of breast 2010  ? Personal history of radiation therapy 2010  ? mammosite  ? T2DM (type 2 diabetes mellitus) ([place]) 2011  ? ?Past Surgical History:  ?Procedure Laterality Date  ? ABDOMINAL EXPLORATION SURGERY  2000  ? ovarian cyst  ? BREAST BIOPSY Right 2010  ? +   ? BREAST EXCISIONAL BIOPSY Right 2010  ? + mammo site inasive mammo ca  ? BREAST LUMPECTOMY Right 2010  ? [place]  ? COLONOSCOPY  07/19/2012  ? Normal exam, Dr. Bary Castilla  ? COLONOSCOPY WITH PROPOFOL N/A 04/21/2022  ? Procedure: COLONOSCOPY WITH PROPOFOL;  Surgeon: Robert Bellow, MD;  Location: [facility] ENDOSCOPY;  Service: Endoscopy;  Laterality: N/A;  ? MASTECTOMY Right 2010  ? partial/ lumpectomy  ? TUBAL LIGATION  20 years ago  ? ?Social History  ? ?Socioeconomic History  ? Marital status: Married  ?  Spouse name: Antonio  ? Number of children: 2  ? Years of education: Not on file  ? Highest education level: Not on file  ?Occupational History  ? 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 Smoking status: Former  ?  Packs/day: 0.25  ?  Years: 10.00  ?  Pack years: 2.50  ?  Types: Cigarettes  ?  Start date: 65  ?  Quit date: 65  ?  Years since quitting: 35.3  ? Smokeless tobacco: Never  ? Tobacco comments:  ?  smoking cessation materials not required  ?Vaping Use  ? Vaping Use: Never used  ?Substance and Sexual Activity  ? Alcohol use: Yes  ?  Comment: wine occ  ? Drug use: No  ? Sexual activity: Not Currently  ?  Comment: husband has ED  ?Other Topics Concern  ? Not on file  ?Social History Narrative  ? Not on file  ? ?Social Determinants of Health  ? ?Financial Resource Strain: Low Risk   ?  Difficulty of Paying Living Expenses: Not hard at all  ?Food Insecurity: No Food Insecurity  ? Worried About Charity fundraiser in the Last Year: Never true  ? Ran Out of Food in the Last Year: Never true  ?Transportation Needs: No Transportation Needs  ? Lack of Transportation (Medical): No  ? Lack of Transportation (Non-Medical): No  ?Physical Activity: Inactive  ? Days of Exercise per Week: 0 days  ? Minutes of Exercise per  Session: 0 min  ?Stress: No Stress Concern Present  ? Feeling of Stress : Only a little  ?Social Connections: Moderately Integrated  ? Frequency of Communication with Friends and Family: More than three times a week  ? Frequency of Social Gatherings with Friends and Family: Three times a week  ? Attends Religious Services: More than 4 times per year  ? Active Member of Clubs or Organizations: No  ? Attends Archivist Meetings: Never  ? Marital Status: Married  ?Intimate Partner Violence: Not At Risk  ? Fear of Current or Ex-Partner: No  ? Emotionally Abused: No  ? Physically Abused: No  ? Sexually Abused: No  ? ?Social History  ? ?Social History Narrative  ? Not on file  ? ? ? ?ROS: Negative.  ? ? ? ?PE: ?HEENT: Negative. ?Lungs: Clear. ?Cardio: RR. ? ? ?Assessment/Plan: ? ?Proceed with planned right mastectomy and SLN biopsy. ?Robert Bellow ?04/26/2022 ? ?  ?

## 2022-04-26 NOTE — Op Note (Signed)
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 ? ?Postoperative diagnosis: Same. ? ?Operative procedure: Right simple mastectomy with axillary sampling.  Failed sentinel node biopsy. ? ?Operating surgeon: Hervey Ard, MD. ? ?Assistant: Elsie Stain, RNFA. ? ?Anesthesia: General by LMA. ? ?Estimated blood loss: 50-75 cc. ? ?Clinical note: This 71 year old woman is now 13 years posttreatment of a stage I ER/PR positive HER2 negative carcinoma of the upper outer quadrant of the right breast.  She received postoperative adjuvant hormonal therapy.  This year her mammogram showed new areas of abnormality and clinical exam showed a new mass in the retroareolar area on the right.  Core biopsy of both these areas showed a triple negative tumor.  Given the location and prior whole breast radiation she was felt to be best managed by completion mastectomy. ? ?The patient received Ancef prior to the procedure.  SCD stockings for DVT prevention. ? ?Operative note: The breast was cleansed with alcohol and 5 cc of 0.5% methylene blue was instilled in the subareolar plexus.  The breast was then cleansed with ChloraPrep and draped.  Elliptical incision was outlined.  The skin was incised sharply and the remaining dissection completed with the photon blade.  5-6 mm thick flaps were elevated to the clavicle superiorly, sternum medially, rectus fascia inferiorly and the serratus muscle laterally.  The breast was then elevated off the underlying pectoralis muscle taking the fascia of that muscle with the specimen.  Hemostasis was with 3-0 Vicryl ties and cautery.   ? ?Scanning through the axilla with the node seeker showed no areas of increased uptake and no blue lymphatics.  It was elected to sweep the inferior contents of the axilla and sent this as a separate specimen.  This was not a formal axillary dissection and needs of the long thoracic nerve of Bell nor thoracodorsal nerve artery and vein  bundle were dissected free.  This was below the level of the antecubital ? ?The wound was irrigated with water.  A 15 [place] Blake drain was brought out through a medial stab wound incision and anchored into position with a 3-0 nylon suture.  The flaps were then approximated with a running 2-0 Vicryl deep dermal suture in 2 segments.  Benzoin and Steri-Strips followed by Telfa, fluff gauze and a compressive wrap were applied.  The drain was placed to self suction. ? ?The patient tolerated the procedure well and was taken to the PACU in stable condition. ?

## 2022-04-26 NOTE — Anesthesia Preprocedure Evaluation (Signed)
Anesthesia Evaluation  ?Patient identified by MRN, date of birth, ID band ?Patient awake ? ? ? ?Reviewed: ?Allergy & Precautions, NPO status , Patient's Chart, lab work & pertinent test results ? ?History of Anesthesia Complications ?Negative for: history of anesthetic complications ? ?Airway ?Mallampati: II ? ?TM Distance: >3 FB ?Neck ROM: Full ? ? ? Dental ? ?(+) Teeth Intact, Dental Advidsory Given ?  ?Pulmonary ?neg pulmonary ROS, former smoker,  ?  ?Pulmonary exam normal ? ?+ decreased breath sounds ? ? ? ? ? Cardiovascular ?Exercise Tolerance: Good ?hypertension, Pt. on medications ?(-) angina(-) Past MI and (-) Cardiac Stents Normal cardiovascular exam(-) dysrhythmias (-) Valvular Problems/Murmurs ?Rhythm:Regular Rate:Normal ? ? ?  ?Neuro/Psych ?negative neurological ROS ? negative psych ROS  ? GI/Hepatic ?negative GI ROS, Neg liver ROS, hiatal hernia,   ?Endo/Other  ?negative endocrine ROSdiabetes, Well Controlled, Type 2 ? Renal/GU ?negative Renal ROS  ?negative genitourinary ?  ?Musculoskeletal ?negative musculoskeletal ROS ?(+)  ? Abdominal ?Normal abdominal exam  (+)   ?Peds ?negative pediatric ROS ?(+)  Hematology ?negative hematology ROS ?(+)   ?Anesthesia Other Findings ?Past Medical History: ?No date: Cervical radiculopathy ?No date: Hiatal hernia ?No date: Hypercholesterolemia ?No date: Hyperlipidemia ?No date: Hypertension ?No date: IBS (irritable bowel syndrome) ?04/11/2009: Malignant neoplasm of upper-outer quadrant of female  ?breast ([place]) ?    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             grade, T1b, N0, M0 ER 90%, PR 15%, HER-2/neu 1+ low  ?             Oncotype and recurrent score. Arimidex therapy completed  ?             September 2015 ?No date: Mild mitral valve prolapse ?2010: Personal history of malignant neoplasm of breast ?2010: Personal history of radiation therapy ?    Comment:  mammosite ?2011: T2DM (type 2 diabetes  mellitus) ([place]) ? ?Past Surgical History: ?2000: ABDOMINAL EXPLORATION SURGERY ?    Comment:  ovarian cyst ?2010: BREAST BIOPSY; Right ?    Comment:  +  ?2010: BREAST EXCISIONAL BIOPSY; Right ?    Comment:  + mammo site inasive mammo ca ?2010: BREAST LUMPECTOMY; Right ?    Comment:  [facility] ?07/19/2012: COLONOSCOPY ?    Comment:  Normal exam, Dr. Bary Castilla ?2010: MASTECTOMY; Right ?    Comment:  partial/ lumpectomy ?20 years ago: TUBAL LIGATION ? ?BMI   ? Body Mass Index: 32.60 kg/m?  ?  ? ? Reproductive/Obstetrics ?negative OB ROS ? ?  ? ? ? ? ? ? ? ? ? ? ? ? ? ?  ?  ? ? ? ? ? ? ? ? ?Anesthesia Physical ? ?Anesthesia Plan ? ?ASA: 3 ? ?Anesthesia Plan: General  ? ?Post-op Pain Management:   ? ?Induction: Intravenous ? ?PONV Risk Score and Plan: Ondansetron, Dexamethasone and Treatment may vary due to age or medical condition ? ?Airway Management Planned: LMA ? ?Additional Equipment:  ? ?Intra-op Plan:  ? ?Post-operative Plan: Extubation in OR ? ?Informed Consent: I have reviewed the patients History and Physical, chart, labs and discussed the procedure including the risks, benefits and alternatives for the proposed anesthesia with the patient or authorized representative who has indicated his/her understanding and acceptance.  ? ? ? ?Dental Advisory Given ? ?Plan Discussed with: CRNA and Surgeon ? ?Anesthesia Plan Comments:   ? ? ? ? ? ? ?Anesthesia Quick Evaluation ? ?

## 2022-04-26 NOTE — Anesthesia Procedure Notes (Signed)
Procedure Name: LMA Insertion ?Date/Time: 04/26/2022 10:21 AM ?Performed by: Loletha Grayer, CRNA ?Pre-anesthesia Checklist: Patient identified, Patient being monitored, Timeout performed, Emergency Drugs available and Suction available ?Patient Re-evaluated:Patient Re-evaluated prior to induction ?Oxygen Delivery Method: Circle system utilized ?Preoxygenation: Pre-oxygenation with 100% oxygen ?Induction Type: IV induction ?Ventilation: Mask ventilation without difficulty ?LMA: LMA inserted ?LMA Size: 4.5 ?Number of attempts: 2 ?Placement Confirmation: positive ETCO2 and breath sounds checked- equal and bilateral ?Tube secured with: Tape ?Dental Injury: Teeth and Oropharynx as per pre-operative assessment  ?Comments: LMA #4 placed-poor seal, replaced with 4.5 AirQ x1 attempt. Atraumatic placement both times ? ? ? ? ?

## 2022-04-26 NOTE — Transfer of Care (Signed)
Immediate Anesthesia Transfer of Care Note ? ?Patient: Morgan Peterson ? ?Procedure(s) Performed: SIMPLE MASTECTOMY WITH AXILLARY SENTINEL NODE BIOPSY (Right: Breast) ? ?Patient Location: PACU ? ?Anesthesia Type:General ? ?Level of Consciousness: awake ? ?Airway & Oxygen Therapy: Patient Spontanous Breathing and Patient connected to face mask oxygen ? ?Post-op Assessment: Report given to RN and Post -op Vital signs reviewed and stable ? ?Post vital signs: Reviewed and stable ? ?Last Vitals:  ?Vitals Value Taken Time  ?BP 107/63 04/26/22 1230  ?Temp    ?Pulse 63 04/26/22 1235  ?Resp 13 04/26/22 1235  ?SpO2 100 % 04/26/22 1235  ?Vitals shown include unvalidated device data. ? ?Last Pain:  ?Vitals:  ? 04/26/22 0903  ?TempSrc: Oral  ?   ? ?Patients Stated Pain Goal: 0 (04/26/22 0459) ? ?Complications: No notable events documented. ?

## 2022-04-26 NOTE — Discharge Instructions (Addendum)

## 2022-04-27 ENCOUNTER — Encounter: Payer: Self-pay | Admitting: General Surgery

## 2022-04-27 ENCOUNTER — Other Ambulatory Visit: Payer: Self-pay | Admitting: Family Medicine

## 2022-04-27 DIAGNOSIS — I10 Essential (primary) hypertension: Secondary | ICD-10-CM

## 2022-04-27 NOTE — Anesthesia Postprocedure Evaluation (Signed)
Anesthesia Post Note ? ?Patient: Morgan Peterson ? ?Procedure(s) Performed: SIMPLE MASTECTOMY WITH AXILLARY SENTINEL NODE BIOPSY (Right: Breast) ? ?Patient location during evaluation: PACU ?Anesthesia Type: General ?Level of consciousness: awake and alert ?Pain management: pain level controlled ?Vital Signs Assessment: post-procedure vital signs reviewed and stable ?Respiratory status: spontaneous breathing, nonlabored ventilation, respiratory function stable and patient connected to nasal cannula oxygen ?Cardiovascular status: blood pressure returned to baseline and stable ?Postop Assessment: no apparent nausea or vomiting ?Anesthetic complications: no ? ? ?No notable events documented. ? ? ?Last Vitals:  ?Vitals:  ? 04/26/22 1345 04/26/22 1401  ?BP: 132/60 128/74  ?Pulse:  66  ?Resp:  14  ?Temp: (!) 36.3 ?C (!) 36.2 ?C  ?SpO2:  97%  ?  ?Last Pain:  ?Vitals:  ? 04/27/22 0906  ?TempSrc:   ?PainSc: 0-No pain  ? ? ?  ?  ?  ?  ?  ?  ? ?Molli Barrows ? ? ? ? ?

## 2022-04-28 ENCOUNTER — Other Ambulatory Visit: Payer: Self-pay | Admitting: Anatomic Pathology & Clinical Pathology

## 2022-04-28 LAB — SURGICAL PATHOLOGY

## 2022-05-04 ENCOUNTER — Other Ambulatory Visit: Payer: Self-pay | Admitting: General Surgery

## 2022-05-04 DIAGNOSIS — C50411 Malignant neoplasm of upper-outer quadrant of right female breast: Secondary | ICD-10-CM

## 2022-05-07 ENCOUNTER — Encounter: Payer: Self-pay | Admitting: Oncology

## 2022-05-07 NOTE — Progress Notes (Signed)
Contacted pt for new patient visit. Chart and meds updated.

## 2022-05-10 ENCOUNTER — Inpatient Hospital Stay: Payer: Medicare Other

## 2022-05-10 ENCOUNTER — Inpatient Hospital Stay: Payer: Medicare Other | Attending: Oncology | Admitting: Oncology

## 2022-05-10 ENCOUNTER — Other Ambulatory Visit: Payer: Self-pay

## 2022-05-10 VITALS — BP 145/80 | HR 68 | Temp 96.8°F | Wt 200.0 lb

## 2022-05-10 DIAGNOSIS — Z9011 Acquired absence of right breast and nipple: Secondary | ICD-10-CM | POA: Insufficient documentation

## 2022-05-10 DIAGNOSIS — Z7982 Long term (current) use of aspirin: Secondary | ICD-10-CM | POA: Insufficient documentation

## 2022-05-10 DIAGNOSIS — Z853 Personal history of malignant neoplasm of breast: Secondary | ICD-10-CM

## 2022-05-10 DIAGNOSIS — Z7189 Other specified counseling: Secondary | ICD-10-CM | POA: Diagnosis not present

## 2022-05-10 DIAGNOSIS — Z79899 Other long term (current) drug therapy: Secondary | ICD-10-CM | POA: Diagnosis not present

## 2022-05-10 DIAGNOSIS — Z923 Personal history of irradiation: Secondary | ICD-10-CM | POA: Insufficient documentation

## 2022-05-10 DIAGNOSIS — C50919 Malignant neoplasm of unspecified site of unspecified female breast: Secondary | ICD-10-CM | POA: Insufficient documentation

## 2022-05-10 DIAGNOSIS — Z87891 Personal history of nicotine dependence: Secondary | ICD-10-CM | POA: Diagnosis not present

## 2022-05-10 DIAGNOSIS — Z5181 Encounter for therapeutic drug level monitoring: Secondary | ICD-10-CM

## 2022-05-10 DIAGNOSIS — Z171 Estrogen receptor negative status [ER-]: Secondary | ICD-10-CM | POA: Diagnosis not present

## 2022-05-10 DIAGNOSIS — I471 Supraventricular tachycardia: Secondary | ICD-10-CM

## 2022-05-10 DIAGNOSIS — C50811 Malignant neoplasm of overlapping sites of right female breast: Secondary | ICD-10-CM

## 2022-05-10 LAB — COMPREHENSIVE METABOLIC PANEL
ALT: 14 U/L (ref 0–44)
AST: 18 U/L (ref 15–41)
Albumin: 3.3 g/dL — ABNORMAL LOW (ref 3.5–5.0)
Alkaline Phosphatase: 81 U/L (ref 38–126)
Anion gap: 6 (ref 5–15)
BUN: 10 mg/dL (ref 8–23)
CO2: 31 mmol/L (ref 22–32)
Calcium: 8.7 mg/dL — ABNORMAL LOW (ref 8.9–10.3)
Chloride: 101 mmol/L (ref 98–111)
Creatinine, Ser: 0.83 mg/dL (ref 0.44–1.00)
GFR, Estimated: >60 mL/min (ref >60)
Glucose, Bld: 149 mg/dL — ABNORMAL HIGH (ref 70–99)
Potassium: 3.9 mmol/L (ref 3.5–5.1)
Sodium: 138 mmol/L (ref 135–145)
Total Bilirubin: 0.3 mg/dL (ref 0.3–1.2)
Total Protein: 7.2 g/dL (ref 6.5–8.1)

## 2022-05-10 LAB — CBC WITH DIFFERENTIAL/PLATELET
Abs Immature Granulocytes: 0.01 10*3/uL (ref 0.00–0.07)
Basophils Absolute: 0.1 10*3/uL (ref 0.0–0.1)
Basophils Relative: 1 %
Eosinophils Absolute: 0.2 10*3/uL (ref 0.0–0.5)
Eosinophils Relative: 4 %
HCT: 36.4 % (ref 36.0–46.0)
Hemoglobin: 11.2 g/dL — ABNORMAL LOW (ref 12.0–15.0)
Immature Granulocytes: 0 %
Lymphocytes Relative: 27 %
Lymphs Abs: 1.6 10*3/uL (ref 0.7–4.0)
MCH: 27.6 pg (ref 26.0–34.0)
MCHC: 30.8 g/dL (ref 30.0–36.0)
MCV: 89.7 fL (ref 80.0–100.0)
Monocytes Absolute: 0.3 10*3/uL (ref 0.1–1.0)
Monocytes Relative: 6 %
Neutro Abs: 3.8 10*3/uL (ref 1.7–7.7)
Neutrophils Relative %: 62 %
Platelets: 280 10*3/uL (ref 150–400)
RBC: 4.06 MIL/uL (ref 3.87–5.11)
RDW: 13.5 % (ref 11.5–15.5)
WBC: 6 10*3/uL (ref 4.0–10.5)
nRBC: 0 % (ref 0.0–0.2)

## 2022-05-10 MED ORDER — ONDANSETRON HCL 8 MG PO TABS: 8.0000 mg | ORAL_TABLET | Freq: Two times a day (BID) | ORAL | 1 refills | Status: DC | PRN

## 2022-05-10 MED ORDER — PROCHLORPERAZINE MALEATE 10 MG PO TABS: 10.0000 mg | ORAL_TABLET | Freq: Four times a day (QID) | ORAL | 1 refills | Status: DC | PRN

## 2022-05-10 MED ORDER — LIDOCAINE-PRILOCAINE 2.5-2.5 % EX CREA: TOPICAL_CREAM | CUTANEOUS | 3 refills | Status: DC

## 2022-05-10 NOTE — Progress Notes (Signed)
START OFF PATHWAY REGIMEN - Breast   [id number]:Carboplatin AUC=5 IV D1 + Paclitaxel 80 mg/m2 IV D1,8,15 + Pembrolizumab 200 mg IV D1 + G-CSF q21 Days x 12 Weeks Followed by [facility] + Pembrolizumab 200 mg IV D1 + G-CSF q21 Days x 12 Weeks:   Cycles 1 through 4: A cycle is every 21 days:     Pembrolizumab      Paclitaxel      Carboplatin      Filgrastim-xxxx    Cycles 5 through 8: A cycle is every 21 days:     Pembrolizumab      Doxorubicin      Cyclophosphamide      Pegfilgrastim-xxxx   **Always confirm dose/schedule in your pharmacy ordering system**  Patient Characteristics: Postoperative without Neoadjuvant Therapy (Pathologic Staging), Invasive Disease, Adjuvant Therapy, HER2 Negative/Unknown/Equivocal, ER Negative/Unknown, Node Positive Therapeutic Status: Postoperative without Neoadjuvant Therapy (Pathologic Staging) AJCC Grade: G3 AJCC N Category: pN1a AJCC M Category: cM0 ER Status: Negative (-) AJCC 8 Stage Grouping: IIA HER2 Status: Negative (-) Oncotype Dx Recurrence Score: Not Appropriate AJCC T Category: pT1c PR Status: Negative (-) Adjuvant Therapy Status: No Adjuvant Therapy Received Yet or Changing Initial Adjuvant Regimen due to Tolerance Intent of Therapy: Curative Intent, Discussed with Patient

## 2022-05-10 NOTE — Progress Notes (Addendum)
Hematology/Oncology Consult note [phone] [phone]         Patient Care Team: Steele Sizer, MD as PCP - General (Family Medicine) Bary Castilla, Forest Gleason, MD (General Surgery) Carloyn Manner, MD as Referring Physician (Otolaryngology) Yolonda Kida, MD as Consulting Physician (Cardiology) Ree Edman, MD as Referring Physician (Dermatology) Lonia Farber, MD as Consulting Physician (Internal Medicine)  REFERRING PROVIDER: Robert Bellow, MD  CHIEF COMPLAINTS/REASON FOR VISIT:  Evaluation of breast cancer.   HISTORY OF PRESENTING ILLNESS:   Morgan Peterson is a  71 y.o.  female with PMH listed below was seen in consultation at the request of  [name], Forest Gleason, MD  for evaluation of breast cancer.    She has a remote history of right breast cancer, T1bN0, ER positive,s/p lumpectomy and MammoSite radiation and 5 years of Arimidex.   02/19/22 screening mammogram bilaterally showed  1. Suspicious right breast mass at the 10 o'clock position 8 cm from the nipple. Recommend ultrasound-guided biopsy. 2. Indeterminate right breast mass at the 6 o'clock position 4 cm from the nipple. Recommend ultrasound-guided biopsy. 3. No suspicious right axillary lymphadenopathy.  04/12/2022 Unilateral right breast diagnostic mammogram showed 1. Suspicious right breast mass at the 10 o'clock position 8 cm from the nipple. Recommend ultrasound-guided biopsy. 2. Indeterminate right breast mass at the 6 o'clock position 4 cm from the nipple. Recommend ultrasound-guided biopsy. 3. No suspicious right axillary lymphadenopathy.  04/13/2022 right breast mass 6 o'clock position 1 cm from the nipple showed invasive mammary carcinoma, no special type. Grade 3, DCIS present high grade, LVI not identified. ER-, PR 1-10%, HER 2 -  04/13/2022, right breast mass 10:00 7 cm from nipple biopsy showed invasive mammary carcinoma, grade 3, high-grade DCIS with focal  comedonecrosis, lymphovascular invasion not identified.  ER -, PR 1 to 10%, HER2-   04/26/2022, right breast mastectomy with axillary dissection Invasive mammary carcinoma, no special type, multifocal, DCIS high-grade, benign nipple/areola.  2 deposits of invasive mammary carcinoma, no definite residual lymph node identified.  Today patient present to establish care for discussion of adjuvant chemotherapy  She reports doing well post operation, no specific concerns over her mastectomy sites.  She has JP drain. No fever, chills.  She was accompanied by husband.  Family history is positive for bladder cancer, breast cancer, cervical cancer, colon cancer, kidney cancer, lung cancer.  Patient follows up with Dr.Callwood for bradycardia, occasional SVT, mitral valve prolapse  Patient denies any previous history of MI.  Denies any history of autoimmune disease.  Review of Systems  Constitutional:  Negative for appetite change, chills, fatigue and fever.  HENT:   Negative for hearing loss and voice change.   Eyes:  Negative for eye problems.  Respiratory:  Negative for chest tightness and cough.   Cardiovascular:  Negative for chest pain.  Gastrointestinal:  Negative for abdominal distention, abdominal pain and blood in stool.  Endocrine: Negative for hot flashes.  Genitourinary:  Negative for difficulty urinating and frequency.   Musculoskeletal:  Negative for arthralgias.  Skin:  Negative for itching and rash.  Neurological:  Negative for extremity weakness.  Hematological:  Negative for adenopathy.  Psychiatric/Behavioral:  Negative for confusion.    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 Arimidex therapy completed September 2015   Mild mitral valve prolapse    Personal history of malignant neoplasm of breast 2010   Personal history of radiation therapy 2010   mammosite   T2DM (type 2 diabetes mellitus) ([place]) 2011    SURGICAL HISTORY: Past Surgical History:  Procedure Laterality Date   ABDOMINAL EXPLORATION SURGERY  2000   ovarian cyst   BREAST BIOPSY Right 2010   +    BREAST EXCISIONAL BIOPSY Right 2010   + mammo site inasive mammo ca   BREAST LUMPECTOMY Right 2010   [facility]   COLONOSCOPY  07/19/2012   Normal exam, Dr. Bary Castilla   COLONOSCOPY WITH PROPOFOL N/A 04/21/2022   Procedure: COLONOSCOPY WITH PROPOFOL;  Surgeon: Robert Bellow, MD;  Location: [facility] ENDOSCOPY;  Service: Endoscopy;  Laterality: N/A;   MASTECTOMY Right 2010   partial/ lumpectomy   SIMPLE MASTECTOMY WITH AXILLARY SENTINEL NODE BIOPSY Right 04/26/2022   Procedure: SIMPLE MASTECTOMY WITH AXILLARY SENTINEL NODE BIOPSY;  Surgeon: Robert Bellow, MD;  Location: [facility] ORS;  Service: General;  Laterality: Right;  RNFA to assist   TUBAL LIGATION  20 years ago    SOCIAL HISTORY: Social History   Socioeconomic History   Marital status: Married    Spouse name: Morgan Peterson   Number of children: 2   Years of education: Not on file   Highest education level: Not on file  Occupational History   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  Tobacco Use   Smoking status: Former    Packs/day: 0.25    Years: 10.00    Pack years: 2.50    Types: Cigarettes    Start date: 1978    Quit date: 1988    Years since quitting: 35.4   Smokeless tobacco: Never   Tobacco comments:    smoking cessation materials not required  Vaping Use   Vaping Use: Never used  Substance and Sexual Activity   Alcohol use: Yes    Comment: wine occ   Drug use: No   Sexual activity: Not Currently    Comment: husband has ED  Other Topics Concern   Not on  file  Social History Narrative   Not on file   Social Determinants of Health   Financial Resource Strain: Low Risk    Difficulty of Paying Living Expenses: Not hard at all  Food Insecurity: No Food Insecurity   Worried About Charity fundraiser in the Last Year: Never true   [place] in the Last Year: Never true  Transportation Needs: No Transportation Needs   Lack of Transportation (Medical): No   Lack of Transportation (Non-Medical): No  Physical Activity: Inactive   Days of Exercise per Week: 0 days   Minutes of Exercise per Session: 0 min  Stress: No Stress Concern Present   Feeling of Stress : Only a little  Social Connections: Moderately Integrated   Frequency of Communication with Friends and Family: More than three times a week   Frequency of Social Gatherings with Friends and Family: Three times a week   Attends Religious Services: More than 4 times per year   Active Member of Clubs or Organizations: No   Attends Archivist Meetings: Never   Marital Status: Married  Human resources officer Violence: Not At Risk   Fear  of Current or Ex-Partner: No   Emotionally Abused: No   Physically Abused: No   Sexually Abused: No    FAMILY HISTORY: Family History  Problem Relation Age of Onset   Osteoporosis Mother    Diabetes Father    Kidney disease Father    Diabetes Brother    Breast cancer Maternal Aunt    Bladder Cancer Maternal Aunt    Cervical cancer Maternal Aunt    Colon cancer Maternal Uncle    Lung cancer Maternal Uncle     ALLERGIES:  is allergic to levaquin [levofloxacin in d5w], losartan, metoprolol, amlodipine-olmesartan, canagliflozin, egg white [egg white (egg protein)], gabapentin, glipizide, gluten meal, lac bovis, lisinopril, metformin and related, milk-related compounds, onglyza [saxagliptin], shrimp extract allergy skin test, tramadol, actos [pioglitazone], atorvastatin, carvedilol, and prednisone.  MEDICATIONS:  Current Outpatient  Medications  Medication Sig Dispense Refill   aspirin EC 81 MG tablet Take 1 tablet (81 mg total) by mouth daily. 30 tablet 0   atenolol (TENORMIN) 25 MG tablet Take 1 tablet (25 mg total) by mouth daily. 90 tablet 1   azelastine (ASTELIN) 0.1 % nasal spray Place 2 sprays into both nostrils daily as needed for allergies or rhinitis. Use in each nostril as directed     betamethasone, augmented, (DIPROLENE) 0.05 % lotion Apply 1 application topically 2 (two) times daily.     Blood Glucose Monitoring Suppl (ONETOUCH VERIO) w/Device KIT 1 Device by Does not apply route once. 1 kit 0   cholecalciferol (VITAMIN D) 1000 UNITS tablet Take 1,000 Units by mouth daily.     Cyanocobalamin (VITAMIN B-12 PO) Take 1 tablet by mouth daily as needed (fatigue).     famotidine (PEPCID) 40 MG tablet Take 40 mg by mouth daily.     fluocinonide (LIDEX) 0.05 % external solution Apply 1 application. topically daily.     glucose blood (ONETOUCH VERIO) test strip Use as instructed 100 each 12   HYDROcodone-acetaminophen (NORCO/VICODIN) 5-325 MG tablet Take 1 tablet by mouth every 4 (four) hours as needed for moderate pain. 15 tablet 0   Lancets (ONETOUCH ULTRASOFT) lancets Use as instructed 100 each 3   levocetirizine (XYZAL) 5 MG tablet Take 1 tablet (5 mg total) by mouth every evening. (Patient taking differently: Take 5 mg by mouth daily as needed for allergies.) 90 tablet 1   montelukast (SINGULAIR) 10 MG tablet Take 1 tablet (10 mg total) by mouth at bedtime. (Patient taking differently: Take 10 mg by mouth at bedtime as needed (allergies).) 90 tablet 1   Multiple Vitamin (MULTIVITAMIN WITH MINERALS) TABS tablet Take 1 tablet by mouth 2 (two) times a week.     omeprazole (PRILOSEC) 20 MG capsule Take 20 mg by mouth every morning.     OZEMPIC, 0.25 OR 0.5 MG/DOSE, 2 MG/1.5ML SOPN Inject 0.5 mg into the skin every Sunday.     rizatriptan (MAXALT-MLT) 10 MG disintegrating tablet Take 5-10 mg by mouth as needed for  migraine. May repeat in 2 hours if needed     rosuvastatin (CRESTOR) 10 MG tablet Take 1 tablet (10 mg total) by mouth daily. 90 tablet 1   triamterene-hydrochlorothiazide (MAXZIDE-25) 37.5-25 MG tablet TAKE 1 TABLET BY MOUTH EVERY DAY 90 tablet 0   EPINEPHrine (EPIPEN 2-PAK) 0.3 mg/0.3 mL IJ SOAJ injection Inject 0.3 mg into the muscle as needed for anaphylaxis. (Patient not taking: Reported on 04/26/2022) 2 each 0   No current facility-administered medications for this visit.     PHYSICAL EXAMINATION: ECOG  PERFORMANCE STATUS: 0 - Asymptomatic Vitals:   05/10/22 1036  BP: (!) 145/80  Pulse: 68  Temp: (!) 96.8 F (36 C)   Filed Weights   05/10/22 1036  Weight: 200 lb (90.7 kg)    Physical Exam Constitutional:      General: She is not in acute distress. HENT:     Head: Normocephalic and atraumatic.  Eyes:     General: No scleral icterus. Cardiovascular:     Rate and Rhythm: Normal rate and regular rhythm.     Heart sounds: Normal heart sounds.  Pulmonary:     Effort: Pulmonary effort is normal. No respiratory distress.     Breath sounds: No wheezing.  Abdominal:     General: Bowel sounds are normal. There is no distension.     Palpations: Abdomen is soft.  Musculoskeletal:        General: No deformity. Normal range of motion.     Cervical back: Normal range of motion and neck supple.  Skin:    General: Skin is warm and dry.     Findings: No erythema or rash.  Neurological:     Mental Status: She is alert and oriented to person, place, and time. Mental status is at baseline.     Cranial Nerves: No cranial nerve deficit.     Coordination: Coordination normal.  Psychiatric:        Mood and Affect: Mood normal.   Patient is status post right breast mastectomy.  + JP drain.   LABORATORY DATA:  I have reviewed the data as listed Lab Results  Component Value Date   WBC 6.0 05/10/2022   HGB 11.2 (L) 05/10/2022   HCT 36.4 05/10/2022   MCV 89.7 05/10/2022   PLT 280  05/10/2022   Recent Labs    08/12/21 0942 12/31/21 1005 04/20/22 0913 05/10/22 1121  NA 141 142 141 138  K 3.7 3.8 3.7 3.9  CL 104 105 102 101  CO2 _0 GLUCOSE 94 111* 90 149*  BUN _1 CREATININE 0.87 0.81 0.83 0.83  CALCIUM 8.9 9.3 9.2 8.7*  GFRNONAA  --   --  >60 >60  PROT 6.8 6.8  --  7.2  ALBUMIN  --   --   --  3.3*  AST 15 15  --  18  ALT 12 10  --  14  ALKPHOS  --   --   --  81  BILITOT 0.3 0.4  --  0.3   Iron/TIBC/Ferritin/ %Sat No results found for: IRON, TIBC, FERRITIN, IRONPCTSAT    RADIOGRAPHIC STUDIES: I have personally reviewed the radiological images as listed and agreed with the findings in the report. NM Sentinel Node Inj-No Rpt (Breast)  Result Date: 04/26/2022 Sulfur Colloid was injected by the Nuclear Medicine Technologist for sentinel lymph node localization.   US BREAST LTD UNI RIGHT INC AXILLA  Result Date: 04/12/2022 CLINICAL DATA:  70 year old female recalled from screening mammogram dated 03/18/2022 for possible right breast asymmetries. EXAM: DIGITAL DIAGNOSTIC UNILATERAL RIGHT MAMMOGRAM WITH TOMOSYNTHESIS AND CAD; ULTRASOUND RIGHT BREAST LIMITED TECHNIQUE: Right digital diagnostic mammography and breast tomosynthesis was performed. The images were evaluated with computer-aided detection.; Targeted ultrasound examination of the right breast was performed COMPARISON:  Previous exam(s). ACR Breast Density Category b: There are scattered areas of fibroglandular density. FINDINGS: There is a persistent irregular, hyperdense mass in the upper-outer quadrant of the right breast at posterior depth. A lobulated circumscribed mass with associated  punctate calcifications that is demonstrated in the central right breast at mid depth. Further evaluation with ultrasound was performed. Targeted ultrasound is performed, showing an irregular, hypoechoic mass with mild associated vascularity at the 10 o'clock position 8 cm from the nipple. It measures 1.4 x  1.4 x 1.3 cm. This corresponds with the upper-outer mass. There is a linear, irregular multilobulated mass at the 6 o'clock position 4 cm from the nipple. Overall, it measures 1.2 x 0.6 x 2.8 cm. This corresponds with the additional mammographic finding. Evaluation of the right axilla demonstrates no suspicious lymphadenopathy. IMPRESSION: 1. Suspicious right breast mass at the 10 o'clock position 8 cm from the nipple. Recommend ultrasound-guided biopsy. 2. Indeterminate right breast mass at the 6 o'clock position 4 cm from the nipple. Recommend ultrasound-guided biopsy. 3. No suspicious right axillary lymphadenopathy. RECOMMENDATION: Two area ultrasound-guided biopsy of the right breast at the 10 and 6 o'clock positions. I have discussed the findings and recommendations with the patient. If applicable, a reminder letter will be sent to the patient regarding the next appointment. BI-RADS CATEGORY  4: Suspicious. Electronically Signed   By: Kristopher Oppenheim M.D.   On: 04/12/2022 09:43  MM DIAG BREAST TOMO UNI RIGHT  Result Date: 04/12/2022 CLINICAL DATA:  71 year old female recalled from screening mammogram dated 03/18/2022 for possible right breast asymmetries. EXAM: DIGITAL DIAGNOSTIC UNILATERAL RIGHT MAMMOGRAM WITH TOMOSYNTHESIS AND CAD; ULTRASOUND RIGHT BREAST LIMITED TECHNIQUE: Right digital diagnostic mammography and breast tomosynthesis was performed. The images were evaluated with computer-aided detection.; Targeted ultrasound examination of the right breast was performed COMPARISON:  Previous exam(s). ACR Breast Density Category b: There are scattered areas of fibroglandular density. FINDINGS: There is a persistent irregular, hyperdense mass in the upper-outer quadrant of the right breast at posterior depth. A lobulated circumscribed mass with associated punctate calcifications that is demonstrated in the central right breast at mid depth. Further evaluation with ultrasound was performed. Targeted  ultrasound is performed, showing an irregular, hypoechoic mass with mild associated vascularity at the 10 o'clock position 8 cm from the nipple. It measures 1.4 x 1.4 x 1.3 cm. This corresponds with the upper-outer mass. There is a linear, irregular multilobulated mass at the 6 o'clock position 4 cm from the nipple. Overall, it measures 1.2 x 0.6 x 2.8 cm. This corresponds with the additional mammographic finding. Evaluation of the right axilla demonstrates no suspicious lymphadenopathy. IMPRESSION: 1. Suspicious right breast mass at the 10 o'clock position 8 cm from the nipple. Recommend ultrasound-guided biopsy. 2. Indeterminate right breast mass at the 6 o'clock position 4 cm from the nipple. Recommend ultrasound-guided biopsy. 3. No suspicious right axillary lymphadenopathy. RECOMMENDATION: Two area ultrasound-guided biopsy of the right breast at the 10 and 6 o'clock positions. I have discussed the findings and recommendations with the patient. If applicable, a reminder letter will be sent to the patient regarding the next appointment. BI-RADS CATEGORY  4: Suspicious. Electronically Signed   By: Kristopher Oppenheim M.D.   On: 04/12/2022 09:43     ASSESSMENT & PLAN:  1. Malignant neoplasm of overlapping sites of right breast in female, estrogen receptor negative ([place])   2. Encounter for monitoring cardiotoxic drug therapy   3. Paroxysmal SVT (supraventricular tachycardia) ([place])   4. Goals of care, counseling/discussion    Cancer Staging  Breast cancer [facility]) Staging form: Breast, AJCC 8th Edition - Pathologic stage from 05/10/2022: Stage IIA (pT1c, pN1a, cM0, G3, ER-, PR+, HER2-) - Signed by Earlie Server, MD on 05/10/2022   #Right breast  invasive carcinoma, pT1(multifocal) pN1a  ER negative, PR weakly positive, HER2 negative. The diagnosis and care plan were discussed with patient in detail.  Chemotherapy education was provided.  I recommend adjuvant chemotherapy.  Patient has had primary surgery done. Will  extrapolate the potential benefit keynote 522 regimen with addition of carboplatin/pembrolizumab to Taxol and AC. I explained to the patient the risks and benefits of chemotherapy including all but not limited to hair loss, mouth sore, nausea, vomiting, diarrhea, low blood counts, bleeding, neuropathy, heart failure and risk of life threatening infection and even death, secondary malignancy etc.  Patient voices understanding and willing to proceed chemotherapy.  Patient has follow-up appointment with surgery Dr. Bary Castilla.  We will allow patient to completely heal from mastectomy wait for surgery to clear patient to proceed with chemotherapy  # Chemotherapy education; Dr. Bary Castilla to place [facility]- port. Antiemetics-Zofran and Compazine; EMLA cream sent to pharmacy  Check 2D echo for baseline evaluation prior to chemotherapy. Continue counseling for genetic testing. I recommend to proceed with CT chest abdomen pelvis to complete staging and rule out distant metastasis.  We will wait after she heals from recent surgery. Check labs, CBC, CMP  #History of paroxysmal SVT patient follows up with Dr. Clayborn Bigness. I ask nurse navigator Webb Silversmith to fax a copy of today's visit to patient's cardiologist.  Patient case was discussed on tumor board on 05/10/2022.  Consensus upon above recommendation was achieved. Supportive care measures are necessary for patient well-being and will be provided as necessary. We spent sufficient time to discuss many aspect of care, questions were answered to patient's satisfaction.   Orders Placed This Encounter  Procedures   CBC with Differential/Platelet    Standing Status:   Future    Number of Occurrences:   1    Standing Expiration Date:   05/11/2023   Comprehensive metabolic panel    Standing Status:   Future    Number of Occurrences:   1    Standing Expiration Date:   05/10/2023   ECHOCARDIOGRAM COMPLETE    Standing Status:   Future    Standing Expiration Date:   05/11/2023     Order Specific Question:   Where should this test be performed    Answer:   [facility]    Order Specific Question:   Perflutren DEFINITY (image enhancing agent) should be administered unless hypersensitivity or allergy exist    Answer:   Administer Perflutren    Order Specific Question:   Reason for exam-Echo    Answer:   Chemo  Z09    All questions were answered. The patient knows to call the clinic with any problems questions or concerns.  cc Bary Castilla Forest Gleason, MD    Return of visit: To be determined. Thank you for this kind referral and the opportunity to participate in the care of this patient. A copy of today's note is routed to referring provider   Earlie Server, MD, PhD [facility] Hematology Oncology 05/10/2022  Addendum Dr.Brahmanday will take over this patient's care.   Earlie Server

## 2022-05-11 ENCOUNTER — Other Ambulatory Visit: Payer: Self-pay | Admitting: General Surgery

## 2022-05-11 NOTE — Progress Notes (Signed)
Subjective:     Patient ID: Morgan Peterson is a 71 y.o. female.   HPI   The following portions of the patient's history were reviewed and updated as appropriate.   This an established patient is here today for: office visit. The patient is here today for her postoperative visit, post right mastectomy on 04-26-22. Patient reports she has an area on the right chest wall that is tender and swollen. She thinks she may have over done it on the range of motion exercises. The patient does have a JP drain in place.    Patient did see Dr. Tasia Catchings yesterday at the [facility].  The referral had been sent to Dr. Rogue Bussing, and a message has been sent to the cancer center that he should be her follow-up physician.   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   She is using Tylenol for any discomfort.      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   90%, PR 15%, HER-2/neu 1+. Low Oncotype recurrence score. MammoSite radiation. Arimidex therapy completed September 2015           Past Surgical History:  Procedure Laterality Date   abdominal exploratory surgery   2000   MASTECTOMY PARTIAL / LUMPECTOMY Right 2010   BREAST EXCISIONAL BIOPSY Right 2010   COLONOSCOPY   07/19/2012    Dr. Bary Castilla   MASTECTOMY SIMPLE Right 04/26/2022    Multifocal triple negative tumor  retroareolar/right upper outer quadrant; 2 foci of disease in the lower axillary fat without associated lymphatic tissue, mpT1c,N1a.   history of arthroscopy       LAPAROSCOPIC TUBAL LIGATION                    OB History     Gravida  2   Para  2   Term      Preterm      AB      Living         SAB      IAB      Ectopic      Molar      Multiple      Live Births           Obstetric Comments  Age at first period 53 Age of first pregnancy 27 Age of menopause 67           Social History           Socioeconomic History   Marital status: Married  Tobacco Use   Smoking status: Former      Packs/day: 0.50      Years: 10.00  Pack years: 5.00      Types: Cigarettes      Start date: 12/21/1987   Smokeless tobacco: Never  Vaping Use   Vaping Use: Never used  Substance and Sexual Activity   Alcohol use: Yes      Comment: occ red wine   Drug use: Never   Sexual activity: Defer             Allergies  Allergen Reactions   Codeine Shortness Of Breath   Levaquin [Levofloxacin] Anaphylaxis   Onglyza [Saxagliptin] Nausea and Palpitations   Amlodipine-Olmesartan Angioedema   Gabapentin Dizziness      cns side effects.   Lisinopril Angioedema   Losartan Angioedema   Metoprolol Angioedema      Lip and tongue tingling and numbness   Egg Unknown   Invokana [Canagliflozin] Other (See Comments)   Milk Other (See Comments)   Prednisone (Bulk) Unknown   Shrimp Other (See Comments)   Actos [Pioglitazone] Nausea, Other (See Comments) and Vomiting      Bladder pain   Atorvastatin Abdominal Pain   Coreg [Carvedilol] Other (See Comments)      NUMBNESS, TINGLING, ACHING IN ARMS   Glipizide Abdominal Pain      Other reaction(s): Abdominal pain   Metformin Abdominal Pain   Tramadol Nausea      Current Medications        Current Outpatient Medications  Medication Sig Dispense Refill   acetaminophen (TYLENOL) 650 MG ER tablet Take 650 mg by mouth every 8  (eight) hours as needed for Pain       aspirin 81 MG chewable tablet Take 81 mg by mouth every other day          atenoloL (TENORMIN) 25 MG tablet Take 1 tablet (25 mg total) by mouth once daily 30 tablet 11   azelastine (ASTELIN) 137 mcg nasal spray Place 1 spray into both nostrils 2 (two) times daily       bisacodyL (DULCOLAX) 5 mg EC tablet Take two tablets morning and two tablets afternoon day prior to Miralax prep. 4 tablet 0   blood glucose diagnostic test strip OneTouch Verio strips       blood glucose diagnostic test strip Use 2 (two) times daily Use as instructed. 100 each 6   calcium carbonate-vitamin D3 200 mg (500 mg) -400 unit Cap Take by mouth       cholecalciferol (VITAMIN D3) 1000 unit tablet Take 2,000 Units by mouth       cyanocobalamin (VITAMIN B12) 1000 MCG tablet Take 1,000 mcg by mouth twice a week DOSE UNKNOWN          EPINEPHrine (EPIPEN) 0.3 mg/0.3 mL auto-injector Inject into the muscle       ergocalciferol, vitamin D2, 50,000 unit capsule         famotidine (PEPCID) 40 MG tablet Take by mouth       lancets 33 gauge Misc OneTouch Delica Lancets 33 gauge       levocetirizine (XYZAL) 5 MG tablet Take 5 mg by mouth every evening       montelukast (SINGULAIR) 10 mg tablet Take 10 mg by mouth at bedtime       pen needle, diabetic 31 gauge x 5/16" needle BD Ultra-Fine Short Pen Needle 31 gauge x 5/16"  USE AS DIRECTED.       polyethylene glycol (MIRALAX) powder One bottle for colonoscopy prep. Use as directed. 255 g 0   rizatriptan (MAXALT) 10 MG tablet Take  10 mg by mouth as directed for Migraine May take a second dose after 2 hours if needed.       rosuvastatin (CRESTOR) 10 MG tablet Take 10 mg by mouth once daily       semaglutide (OZEMPIC) 0.25 mg or 0.5 mg(2 mg/1.5 mL) pen injector Inject 0.375 mLs (0.5 mg total) subcutaneously once a week 4.5 mL 4   triamterene-hydrochlorothiazide (MAXZIDE-25) 37.5-25 mg tablet Take 1 tablet by mouth once daily.        lidocaine-prilocaine (EMLA) cream Apply to areola one hour prior to arrival day of procedure. Cover with saran wrap. (Patient not taking: Reported on 05/04/2022) 5 g 0    No current facility-administered medications for this visit.             Family History  Problem Relation Age of Onset   Diabetes Father     Diabetes Brother     Diabetes Paternal Aunt     Osteoporosis (Thinning of bones) Mother     High blood pressure (Hypertension) Mother     Stroke Sister     Diabetes Sister     No Known Problems Maternal Grandmother     No Known Problems Maternal Grandfather     No Known Problems Paternal Grandmother     No Known Problems Paternal Grandfather     High blood pressure (Hypertension) Sister     Diabetes Sister     Breast cancer Maternal Aunt     Colon cancer Maternal Uncle     Cervical cancer Maternal Aunt     Lung cancer Maternal Uncle          Labs and Radiology:    Drainage volume remains up and about 50 to 70 cc/day.  Serosanguineous more than serous.       Review of Systems  Constitutional: Negative for chills and fever.  Respiratory: Negative for cough.          Objective:   Physical Exam Exam conducted with a chaperone present.  Constitutional:      Appearance: Normal appearance.  Chest:    Neurological:     Mental Status: She is alert and oriented to person, place, and time.  Psychiatric:        Mood and Affect: Mood normal.        Behavior: Behavior normal.           Assessment:     Doing well post right salvage mastectomy.   Candidate for adjuvant chemotherapy.    Plan:     Indications for PowerPort placement were reviewed as well as associated risks and benefits.    This note is partially prepared by Ledell Noss, CMA acting as a scribe in the presence of Dr. Hervey Ard, MD.    The documentation recorded by the scribe accurately reflects the service I personally performed and the decisions made by me.    Robert Bellow, MD  FACS

## 2022-05-12 ENCOUNTER — Other Ambulatory Visit: Payer: Self-pay | Admitting: Internal Medicine

## 2022-05-12 ENCOUNTER — Other Ambulatory Visit: Payer: Self-pay

## 2022-05-12 NOTE — Progress Notes (Signed)
Patient referred to Medical Oncology by Dr. Bary Castilla post right mastectomy.  She has a history history of right breast cancer in 2010, treated with lumpectomy, radiation, Arimidex x 5 years. Supported patient ,and husband at initial Med/Onc consult.   Given Care Plan Summary.  States she has Breast Cancer Treatment Handbook.   Appointments scheduled for Chemotherapy Education, and Echocardiogram.

## 2022-05-12 NOTE — Addendum Note (Signed)
Addended by: Earlie Server on: 05/12/2022 08:37 AM   Modules accepted: Orders

## 2022-05-12 NOTE — Progress Notes (Signed)
OFF PATHWAY REGIMEN - Breast  No Change  Continue With Treatment as Ordered.  Original Decision Date/Time: 05/10/2022 21:26   [id number]:Carboplatin AUC=5 IV D1 + Paclitaxel 80 mg/m2 IV D1,8,15 + Pembrolizumab 200 mg IV D1 + G-CSF q21 Days x 12 Weeks Followed by [facility] + Pembrolizumab 200 mg IV D1 + G-CSF q21 Days x 12 Weeks:   Cycles 1 through 4: A cycle is every 21 days:     Pembrolizumab      Paclitaxel      Carboplatin      Filgrastim-xxxx    Cycles 5 through 8: A cycle is every 21 days:     Pembrolizumab      Doxorubicin      Cyclophosphamide      Pegfilgrastim-xxxx   **Always confirm dose/schedule in your pharmacy ordering system**  Patient Characteristics: Postoperative without Neoadjuvant Therapy (Pathologic Staging), Invasive Disease, Adjuvant Therapy, HER2 Negative/Unknown/Equivocal, ER Negative/Unknown, Node Positive Therapeutic Status: Postoperative without Neoadjuvant Therapy (Pathologic Staging) AJCC Grade: G3 AJCC N Category: pN1a AJCC M Category: cM0 ER Status: Negative (-) AJCC 8 Stage Grouping: IIA HER2 Status: Negative (-) Oncotype Dx Recurrence Score: Not Appropriate AJCC T Category: pT1c PR Status: Negative (-) Adjuvant Therapy Status: No Adjuvant Therapy Received Yet or Changing Initial Adjuvant Regimen due to Tolerance Intent of Therapy: Curative Intent, Discussed with Patient

## 2022-05-12 NOTE — Progress Notes (Signed)
Opened in error

## 2022-05-13 ENCOUNTER — Encounter
Admission: RE | Admit: 2022-05-13 | Discharge: 2022-05-13 | Disposition: A | Discharge status: Home / Self Care | Payer: Medicare Other | Source: Ambulatory Visit | Attending: General Surgery | Admitting: General Surgery

## 2022-05-13 ENCOUNTER — Inpatient Hospital Stay: Payer: Medicare Other

## 2022-05-13 ENCOUNTER — Other Ambulatory Visit: Payer: Self-pay

## 2022-05-13 DIAGNOSIS — Z01818 Encounter for other preprocedural examination: Secondary | ICD-10-CM

## 2022-05-13 DIAGNOSIS — C50811 Malignant neoplasm of overlapping sites of right female breast: Secondary | ICD-10-CM

## 2022-05-13 NOTE — Patient Instructions (Signed)
Your procedure is scheduled on:05-19-22 Wednesday Report to the Registration Desk on the 1st floor of the [facility].Then proceed to the 2nd floor Surgery Desk To find out your arrival time, please call [phone] between 1PM - 3PM on:05-18-22 Tuesday If your arrival time is 6:00 am, do not arrive prior to that time as the [place] entrance doors do not open until 6:00 am.  REMEMBER: Instructions that are not followed completely may result in serious medical risk, up to and including death; or upon the discretion of your surgeon and anesthesiologist your surgery may need to be rescheduled.  Do not eat food after midnight the night before surgery.  No gum chewing, lozengers or hard candies.  You may however, drink Water up to 2 hours before you are scheduled to arrive for your surgery. Do not drink anything within 2 hours of your scheduled arrival time.  Type 1 and Type 2 diabetics should only drink water.  TAKE THESE MEDICATIONS THE MORNING OF SURGERY WITH A SIP OF WATER: -atenolol (TENORMIN) -rosuvastatin (CRESTOR) -omeprazole (PRILOSEC) -take one the night before and one on the morning of surgery - helps to prevent nausea after surgery.)  Continue your 81 mg Aspirin up until the day prior to surgery-Do NOT take the day of surgery  One week prior to surgery: Stop Anti-inflammatories (NSAIDS) such as Advil, Aleve, Ibuprofen, Motrin, Naproxen, Naprosyn and Aspirin based products such as Excedrin, Goodys Powder, BC Powder.You may however, take Tylenol if needed for pain up until the day of surgery.  Stop ANY OVER THE COUNTER supplements/vitamins NOW (05-13-22)until after surgery (Vitamin D, Vitamin B12 and Multivitamin)  No Alcohol for 24 hours before or after surgery.  No Smoking including e-cigarettes for 24 hours prior to surgery.  No chewable tobacco products for at least 6 hours prior to surgery.  No nicotine patches on the day of surgery.  Do not use any "recreational"  drugs for at least a week prior to your surgery.  Please be advised that the combination of cocaine and anesthesia may have negative outcomes, up to and including death. If you test positive for cocaine, your surgery will be cancelled.  On the morning of surgery brush your teeth with toothpaste and water, you may rinse your mouth with mouthwash if you wish. Do not swallow any toothpaste or mouthwash.  Do not wear jewelry, make-up, hairpins, clips or nail polish.  Do not wear lotions, powders, or perfumes.   Do not shave body from the neck down 48 hours prior to surgery just in case you cut yourself which could leave a site for infection.  Also, freshly shaved skin may become irritated if using the CHG soap.  Contact lenses, hearing aids and dentures may not be worn into surgery.  Do not bring valuables to the hospital. [facility] is not responsible for any missing/lost belongings or valuables.   Notify your doctor if there is any change in your medical condition (cold, fever, infection).  Wear comfortable clothing (specific to your surgery type) to the hospital.  After surgery, you can help prevent lung complications by doing breathing exercises.  Take deep breaths and cough every 1-2 hours. Your doctor may order a device called an Incentive Spirometer to help you take deep breaths. When coughing or sneezing, hold a pillow firmly against your incision with both hands. This is called "splinting." Doing this helps protect your incision. It also decreases belly discomfort.  If you are being admitted to the hospital overnight, leave  your suitcase in the car. After surgery it may be brought to your room.  If you are being discharged the day of surgery, you will not be allowed to drive home. You will need a responsible adult (18 years or older) to drive you home and stay with you that night.   If you are taking public transportation, you will need to have a responsible adult (18 years or  older) with you. Please confirm with your physician that it is acceptable to use public transportation.   Please call the [place] Dept. at [phone] if you have any questions about these instructions.  Surgery Visitation Policy:  Patients undergoing a surgery or procedure may have two family members or support persons with them as long as the person is not COVID-19 positive or experiencing its symptoms.

## 2022-05-18 ENCOUNTER — Inpatient Hospital Stay: Payer: Medicare Other

## 2022-05-19 ENCOUNTER — Ambulatory Visit: Payer: Medicare Other

## 2022-05-19 ENCOUNTER — Ambulatory Visit
Admission: RE | Admit: 2022-05-19 | Discharge: 2022-05-19 | Disposition: A | Discharge status: Home / Self Care | Payer: Medicare Other | Attending: General Surgery | Admitting: General Surgery

## 2022-05-19 ENCOUNTER — Ambulatory Visit: Payer: Medicare Other | Admitting: Registered Nurse

## 2022-05-19 ENCOUNTER — Other Ambulatory Visit: Payer: Self-pay

## 2022-05-19 ENCOUNTER — Encounter
Admission: RE | Disposition: A | Discharge status: Home / Self Care | Payer: Self-pay | Source: Home / Self Care | Attending: General Surgery

## 2022-05-19 ENCOUNTER — Encounter: Payer: Self-pay | Admitting: General Surgery

## 2022-05-19 DIAGNOSIS — E785 Hyperlipidemia, unspecified: Secondary | ICD-10-CM | POA: Insufficient documentation

## 2022-05-19 DIAGNOSIS — Z79899 Other long term (current) drug therapy: Secondary | ICD-10-CM | POA: Diagnosis not present

## 2022-05-19 DIAGNOSIS — E119 Type 2 diabetes mellitus without complications: Secondary | ICD-10-CM | POA: Insufficient documentation

## 2022-05-19 DIAGNOSIS — I341 Nonrheumatic mitral (valve) prolapse: Secondary | ICD-10-CM | POA: Insufficient documentation

## 2022-05-19 DIAGNOSIS — E669 Obesity, unspecified: Secondary | ICD-10-CM | POA: Insufficient documentation

## 2022-05-19 DIAGNOSIS — K449 Diaphragmatic hernia without obstruction or gangrene: Secondary | ICD-10-CM | POA: Insufficient documentation

## 2022-05-19 DIAGNOSIS — R5382 Chronic fatigue, unspecified: Secondary | ICD-10-CM | POA: Diagnosis not present

## 2022-05-19 DIAGNOSIS — Z01818 Encounter for other preprocedural examination: Secondary | ICD-10-CM

## 2022-05-19 DIAGNOSIS — Z171 Estrogen receptor negative status [ER-]: Secondary | ICD-10-CM | POA: Diagnosis not present

## 2022-05-19 DIAGNOSIS — I10 Essential (primary) hypertension: Secondary | ICD-10-CM | POA: Diagnosis not present

## 2022-05-19 DIAGNOSIS — R002 Palpitations: Secondary | ICD-10-CM | POA: Insufficient documentation

## 2022-05-19 DIAGNOSIS — Z452 Encounter for adjustment and management of vascular access device: Secondary | ICD-10-CM | POA: Diagnosis not present

## 2022-05-19 DIAGNOSIS — C50911 Malignant neoplasm of unspecified site of right female breast: Secondary | ICD-10-CM | POA: Insufficient documentation

## 2022-05-19 DIAGNOSIS — Z87891 Personal history of nicotine dependence: Secondary | ICD-10-CM | POA: Diagnosis not present

## 2022-05-19 DIAGNOSIS — Z6832 Body mass index (BMI) 32.0-32.9, adult: Secondary | ICD-10-CM | POA: Diagnosis not present

## 2022-05-19 DIAGNOSIS — F411 Generalized anxiety disorder: Secondary | ICD-10-CM | POA: Insufficient documentation

## 2022-05-19 HISTORY — PX: PORTACATH PLACEMENT: SHX2246

## 2022-05-19 LAB — GLUCOSE, CAPILLARY
Glucose-Capillary: 110 mg/dL — ABNORMAL HIGH (ref 70–99)
Glucose-Capillary: 124 mg/dL — ABNORMAL HIGH (ref 70–99)

## 2022-05-19 SURGERY — INSERTION, TUNNELED CENTRAL VENOUS DEVICE, WITH PORT
Anesthesia: General | Laterality: Left

## 2022-05-19 MED ORDER — SODIUM CHLORIDE (PF) 0.9 % IJ SOLN
INTRAMUSCULAR | Status: AC
  Filled 2022-05-19: qty 50

## 2022-05-19 MED ORDER — SODIUM CHLORIDE 0.9 % IV SOLN
INTRAVENOUS | Status: DC
Start: 2022-05-19 — End: 2022-05-19

## 2022-05-19 MED ORDER — LIDOCAINE HCL (PF) 1 % IJ SOLN
INTRAMUSCULAR | Status: DC | PRN
  Administered 2022-05-19: 12 mL

## 2022-05-19 MED ORDER — ORAL CARE MOUTH RINSE: 15.0000 mL | Freq: Once | OROMUCOSAL | Status: DC

## 2022-05-19 MED ORDER — ACETAMINOPHEN 10 MG/ML IV SOLN: 1000.0000 mg | Freq: Once | INTRAVENOUS | Status: DC | PRN

## 2022-05-19 MED ORDER — LACTATED RINGERS IV SOLN: INTRAVENOUS | Status: DC

## 2022-05-19 MED ORDER — ACETAMINOPHEN 10 MG/ML IV SOLN
INTRAVENOUS | Status: AC
  Filled 2022-05-19: qty 100

## 2022-05-19 MED ORDER — KETAMINE HCL 50 MG/5ML IJ SOSY
PREFILLED_SYRINGE | INTRAMUSCULAR | Status: AC
  Filled 2022-05-19: qty 5

## 2022-05-19 MED ORDER — CHLORHEXIDINE GLUCONATE CLOTH 2 % EX PADS: 6.0000 | MEDICATED_PAD | Freq: Once | CUTANEOUS | Status: DC

## 2022-05-19 MED ORDER — PROPOFOL 500 MG/50ML IV EMUL
INTRAVENOUS | Status: DC | PRN
  Administered 2022-05-19: 100 ug/kg/min via INTRAVENOUS

## 2022-05-19 MED ORDER — MIDAZOLAM HCL 2 MG/2ML IJ SOLN
INTRAMUSCULAR | Status: DC | PRN
  Administered 2022-05-19: 1 mg via INTRAVENOUS

## 2022-05-19 MED ORDER — LIDOCAINE HCL (PF) 1 % IJ SOLN
INTRAMUSCULAR | Status: AC
  Filled 2022-05-19: qty 30

## 2022-05-19 MED ORDER — FENTANYL CITRATE (PF) 100 MCG/2ML IJ SOLN
INTRAMUSCULAR | Status: AC
  Filled 2022-05-19: qty 2

## 2022-05-19 MED ORDER — FENTANYL CITRATE (PF) 100 MCG/2ML IJ SOLN
INTRAMUSCULAR | Status: DC | PRN
  Administered 2022-05-19 (×3): 25 ug via INTRAVENOUS

## 2022-05-19 MED ORDER — FENTANYL CITRATE (PF) 100 MCG/2ML IJ SOLN: 25.0000 ug | INTRAMUSCULAR | Status: DC | PRN

## 2022-05-19 MED ORDER — PROPOFOL 10 MG/ML IV BOLUS
INTRAVENOUS | Status: DC | PRN
  Administered 2022-05-19: 40 mg via INTRAVENOUS

## 2022-05-19 MED ORDER — OXYCODONE HCL 5 MG PO TABS: 5.0000 mg | ORAL_TABLET | Freq: Once | ORAL | Status: DC | PRN

## 2022-05-19 MED ORDER — MIDAZOLAM HCL 2 MG/2ML IJ SOLN
INTRAMUSCULAR | Status: AC
  Filled 2022-05-19: qty 2

## 2022-05-19 MED ORDER — ONDANSETRON HCL 4 MG/2ML IJ SOLN: 4.0000 mg | Freq: Once | INTRAMUSCULAR | Status: DC | PRN

## 2022-05-19 MED ORDER — KETAMINE HCL 10 MG/ML IJ SOLN
INTRAMUSCULAR | Status: DC | PRN
  Administered 2022-05-19: 20 mg via INTRAVENOUS

## 2022-05-19 MED ORDER — PROPOFOL 1000 MG/100ML IV EMUL
INTRAVENOUS | Status: AC
  Filled 2022-05-19: qty 100

## 2022-05-19 MED ORDER — CEFAZOLIN SODIUM-DEXTROSE 2-4 GM/100ML-% IV SOLN
INTRAVENOUS | Status: AC
  Filled 2022-05-19: qty 100

## 2022-05-19 MED ORDER — CHLORHEXIDINE GLUCONATE 0.12 % MT SOLN
OROMUCOSAL | Status: AC
  Filled 2022-05-19: qty 15

## 2022-05-19 MED ORDER — ACETAMINOPHEN 10 MG/ML IV SOLN
INTRAVENOUS | Status: DC | PRN
  Administered 2022-05-19: 1000 mg via INTRAVENOUS

## 2022-05-19 MED ORDER — OXYCODONE HCL 5 MG/5ML PO SOLN: 5.0000 mg | Freq: Once | ORAL | Status: DC | PRN

## 2022-05-19 MED ORDER — EPHEDRINE SULFATE (PRESSORS) 50 MG/ML IJ SOLN
INTRAMUSCULAR | Status: DC | PRN
  Administered 2022-05-19: 10 mg via INTRAVENOUS

## 2022-05-19 MED ORDER — CEFAZOLIN SODIUM-DEXTROSE 2-4 GM/100ML-% IV SOLN
2.0000 g | INTRAVENOUS | Status: AC
  Administered 2022-05-19: 2 g via INTRAVENOUS

## 2022-05-19 SURGICAL SUPPLY — 34 items
BAG DECANTER FOR FLEXI CONT (MISCELLANEOUS) ×2 IMPLANT
BLADE SURG 15 STRL SS SAFETY (BLADE) ×2 IMPLANT
CHLORAPREP W/TINT 26 (MISCELLANEOUS) ×2 IMPLANT
COVER LIGHT HANDLE STERIS (MISCELLANEOUS) ×4 IMPLANT
DRAPE C-ARM XRAY 36X54 (DRAPES) ×2 IMPLANT
DRAPE LAPAROTOMY TRNSV 106X77 (MISCELLANEOUS) ×2 IMPLANT
DRSG TEGADERM 2-3/8X2-3/4 SM (GAUZE/BANDAGES/DRESSINGS) ×2 IMPLANT
DRSG TEGADERM 4X4.75 (GAUZE/BANDAGES/DRESSINGS) ×2 IMPLANT
DRSG TELFA 4X3 1S NADH ST (GAUZE/BANDAGES/DRESSINGS) ×1 IMPLANT
ELECT REM PT RETURN 9FT ADLT (ELECTROSURGICAL) ×2
ELECTRODE REM PT RTRN 9FT ADLT (ELECTROSURGICAL) ×1 IMPLANT
GAUZE 4X4 16PLY ~~LOC~~+RFID DBL (SPONGE) ×2 IMPLANT
GLOVE BIO SURGEON STRL SZ7.5 (GLOVE) ×3 IMPLANT
GLOVE BIOGEL PI IND STRL 8 (GLOVE) ×1 IMPLANT
GLOVE BIOGEL PI INDICATOR 8 (GLOVE) ×2
GOWN STRL REUS W/ TWL LRG LVL3 (GOWN DISPOSABLE) ×2 IMPLANT
GOWN STRL REUS W/TWL LRG LVL3 (GOWN DISPOSABLE) ×2
IV NS 500ML (IV SOLUTION) ×1
IV NS 500ML BAXH (IV SOLUTION) ×1 IMPLANT
KIT PORT POWER 8FR ISP CVUE (Port) ×2 IMPLANT
KIT TURNOVER KIT A (KITS) ×2 IMPLANT
LABEL OR SOLS (LABEL) ×2 IMPLANT
MANIFOLD NEPTUNE II (INSTRUMENTS) ×2 IMPLANT
PACK PORT-A-CATH (MISCELLANEOUS) ×2 IMPLANT
SPIKE FLUID TRANSFER (MISCELLANEOUS) ×2 IMPLANT
STRIP CLOSURE SKIN 1/2X4 (GAUZE/BANDAGES/DRESSINGS) ×2 IMPLANT
SUT PROLENE 3 0 SH DA (SUTURE) ×2 IMPLANT
SUT VIC AB 3-0 SH 27 (SUTURE) ×1
SUT VIC AB 3-0 SH 27X BRD (SUTURE) ×1 IMPLANT
SUT VIC AB 4-0 FS2 27 (SUTURE) ×2 IMPLANT
SWABSTK COMLB BENZOIN TINCTURE (MISCELLANEOUS) ×2 IMPLANT
SYR 10ML LL (SYRINGE) ×2 IMPLANT
SYR 10ML SLIP (SYRINGE) ×2 IMPLANT
WATER STERILE IRR 500ML POUR (IV SOLUTION) ×1 IMPLANT

## 2022-05-19 NOTE — Progress Notes (Signed)
Per Dr. Bary Castilla patient is okay to go once in post-op, he has already evaluated her in PACU and says she looks good.

## 2022-05-19 NOTE — Op Note (Signed)
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  Postoperative diagnosis: Same.  Operative procedure: 1) attempted left subclavian catheterization; 2) left internal jugular PowerPort placement.  With ultrasound and fluoroscopic guidance.  Operating surgeon: Hervey Ard, MD.  Anesthesia: Attended local, 12 cc of 1% Xylocaine, plain.  Estimated blood loss: Minimal.  Clinical note: This 71 year old woman has recently been diagnosed with multifocal triple negative breast cancer.  Central venous access has been requested for adjuvant chemotherapy.  Operative note: With the patient and comfortably supine on the table and the left arm tucked the neck and chest on the left side was cleansed with ChloraPrep and draped.  Ultrasound was brought onto the field and examination of the subclavian showed it to be relatively small but patent.  Local anesthesia was infiltrated for the port pocket as well as the access site.  Under ultrasound guidance the vein could be cannulated but the guidewire could not be advanced.  Attention was turned to the left internal jugular vein which was quite large and patent.  Local anesthesia was infiltrated the vein cannulated and guidewire advanced into the IVC.  The dilator was placed followed by the catheter.  The catheter tip was positioned just below the junction of the SVC and right atrium.  The port was then tunneled to the pocket on the left anterior chest.  The port was anchored to the deep tissue with 3-0 Prolene sutures x2.  The subcutaneous fat was approximated with a running 3-0 Vicryl suture and the skin closed with a running 4-0 Vicryl subcuticular suture.  The port was accessed with the [facility] needle and easily aspirated and irrigated with 10 cc of injectable saline.  Benzoin Steri-Strips followed by Telfa and Tegaderm dressings were applied.  Patient was transported to PACU in stable  condition.  Portable chest x-ray showed the catheter as described above and no evidence of pneumothorax.

## 2022-05-19 NOTE — Transfer of Care (Signed)
Immediate Anesthesia Transfer of Care Note  Patient: Morgan Peterson  Procedure(s) Performed: INSERTION PORT-A-CATH (Left)  Patient Location: PACU  Anesthesia Type:General  Level of Consciousness: drowsy  Airway & Oxygen Therapy: Patient Spontanous Breathing  Post-op Assessment: Report given to RN and Post -op Vital signs reviewed and stable  Post vital signs: Reviewed and stable  Last Vitals:  Vitals Value Taken Time  BP 123/62 05/19/22 1022  Temp    Pulse 58 05/19/22 1025  Resp 14 05/19/22 1025  SpO2 95 % 05/19/22 1025  Vitals shown include unvalidated device data.  Last Pain:  Vitals:   05/19/22 0848  TempSrc: Temporal  PainSc: 0-No pain         Complications: No notable events documented.

## 2022-05-19 NOTE — H&P (Signed)
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  Central venous access requested for adjuvant chemotherapy.   Medications Prior to Admission  Medication Sig Dispense Refill Last Dose   aspirin EC 81 MG tablet Take 1 tablet (81 mg total) by mouth daily. 30 tablet 0 05/18/2022   atenolol (TENORMIN) 25 MG tablet Take 1 tablet (25 mg total) by mouth daily. (Patient taking differently: Take 25 mg by mouth every morning.) 90 tablet 1 05/19/2022 at 0645   azelastine (ASTELIN) 0.1 % nasal spray Place 2 sprays into both nostrils daily as needed for allergies or rhinitis. Use in each nostril as directed   Past Week   betamethasone, augmented, (DIPROLENE) 0.05 % lotion Apply 1 application topically 2 (two) times daily.   Past Week   Blood Glucose Monitoring Suppl (ONETOUCH VERIO) w/Device KIT 1 Device by Does not apply route once. 1 kit 0 05/19/2022   cholecalciferol (VITAMIN D) 1000 UNITS tablet Take 1,000 Units by mouth daily.   Past Week   Cyanocobalamin (VITAMIN B-12 PO) Take 1 tablet by mouth daily as needed (fatigue).   Past Week   fluocinonide (LIDEX) 0.05 % external solution Apply 1 application. topically daily.   Past Week   glucose blood (ONETOUCH VERIO) test strip Use as instructed 100 each 12 Past Week   HYDROcodone-acetaminophen (NORCO/VICODIN) 5-325 MG tablet Take 1 tablet by mouth every 4 (four) hours as needed for moderate pain. 15 tablet 0 Past Month   Lancets (ONETOUCH ULTRASOFT) lancets Use as instructed 100 each 3 05/19/2022   levocetirizine (XYZAL) 5 MG tablet Take 1 tablet (5 mg total) by mouth every evening. (Patient taking differently: Take 5 mg by mouth daily as needed for allergies.) 90 tablet 1 Past Month   montelukast (SINGULAIR) 10 MG tablet Take 1 tablet (10 mg total) by mouth at bedtime. (Patient taking differently: Take 10 mg by mouth at bedtime as needed (allergies).) 90 tablet 1 Past Month   Multiple Vitamin  (MULTIVITAMIN WITH MINERALS) TABS tablet Take 1 tablet by mouth 2 (two) times a week.   Past Week   omeprazole (PRILOSEC) 20 MG capsule Take 20 mg by mouth every morning.   05/19/2022   OZEMPIC, 0.25 OR 0.5 MG/DOSE, 2 MG/1.5ML SOPN Inject 0.5 mg into the skin every Sunday.   Past Week   rizatriptan (MAXALT-MLT) 10 MG disintegrating tablet Take 5-10 mg by mouth as needed for migraine. May repeat in 2 hours if needed   Past Month   rosuvastatin (CRESTOR) 10 MG tablet Take 1 tablet (10 mg total) by mouth daily. (Patient taking differently: Take 10 mg by mouth every morning.) 90 tablet 1 05/19/2022   triamterene-hydrochlorothiazide (MAXZIDE-25) 37.5-25 MG tablet TAKE 1 TABLET BY MOUTH EVERY DAY (Patient taking differently: Take 1 tablet by mouth every morning.) 90 tablet 0 05/18/2022   EPINEPHrine (EPIPEN 2-PAK) 0.3 mg/0.3 mL IJ SOAJ injection Inject 0.3 mg into the muscle as needed for anaphylaxis. (Patient not taking: Reported on 04/26/2022) 2 each 0    Allergies  Allergen Reactions   Levaquin [Levofloxacin In D5w] Swelling    Other reaction(s): Arthralgia (Joint Pain)   Losartan     Other reaction(s): Angioedema   Metoprolol Swelling    Lip and tongue tingling and numbness   Amlodipine-Olmesartan    Canagliflozin Other (See Comments)    Bladder pain   Egg White [Egg White (Egg Protein)]     Unknown reaction  Gabapentin     cns side effects.   Glipizide     Other reaction(s): Abdominal pain   Gluten Meal    Lac Bovis    Lisinopril Swelling    Angioedema   Metformin And Related Nausea Only   Milk-Related Compounds    Onglyza [Saxagliptin] Other (See Comments)    Increased PVC's   Shrimp Extract Allergy Skin Test     Other reaction(s): Other (See Comments)   Tramadol Nausea And Vomiting   Actos [Pioglitazone] Other (See Comments) and Nausea And Vomiting    Bladder pain   Atorvastatin Other (See Comments)    Other reaction(s): Abdominal Pain, Other (See Comments)   Carvedilol Other  (See Comments)    NUMBNESS, TINGLING, ACHING IN ARMS   Prednisone Palpitations   Past Medical History:  Diagnosis Date   Cervical radiculopathy    Hiatal hernia    Hypercholesterolemia    Hyperlipidemia    Hypertension    IBS (irritable bowel syndrome)    Malignant neoplasm of upper-outer quadrant of female breast ([place]) 04/11/2009   Right breast, invasive ductal carcinoma, 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 Arimidex therapy completed September 2015   Mild mitral valve prolapse    Personal history of malignant neoplasm of breast 2010   Personal history of radiation therapy 2010   mammosite   T2DM (type 2 diabetes mellitus) ([place]) 2011   Past Surgical History:  Procedure Laterality Date   ABDOMINAL EXPLORATION SURGERY  2000   ovarian cyst   BREAST BIOPSY Right 2010   +    BREAST EXCISIONAL BIOPSY Right 2010   + mammo site inasive mammo ca   BREAST LUMPECTOMY Right 2010   [facility]   COLONOSCOPY  07/19/2012   Normal exam, Dr. Bary Peterson   COLONOSCOPY WITH PROPOFOL N/A 04/21/2022   Procedure: COLONOSCOPY WITH PROPOFOL;  Surgeon: Morgan Bellow, MD;  Location: [facility] ENDOSCOPY;  Service: Endoscopy;  Laterality: N/A;   MASTECTOMY Right 2010   partial/ lumpectomy   SIMPLE MASTECTOMY WITH AXILLARY SENTINEL NODE BIOPSY Right 04/26/2022   Procedure: SIMPLE MASTECTOMY WITH AXILLARY SENTINEL NODE BIOPSY;  Surgeon: Morgan Bellow, MD;  Location: [facility] ORS;  Service: General;  Laterality: Right;  RNFA to assist   TUBAL LIGATION  20 years ago   Social History   Socioeconomic History   Marital status: Married    Spouse name: Morgan Peterson   Number of children: 2   Years of education: Not on file   Highest education level: Not on file  Occupational History   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  Tobacco Use   Smoking status: Former    Packs/day: 0.25    Years: 10.00    Pack years: 2.50    Types: Cigarettes    Start date:  1978    Quit date: 1988    Years since quitting: 35.4   Smokeless tobacco: Never   Tobacco comments:    smoking cessation materials not required  Vaping Use   Vaping Use: Never used  Substance and Sexual Activity   Alcohol use: Yes    Comment: wine occ   Drug use: No   Sexual activity: Not Currently    Comment: husband has ED  Other Topics Concern   Not on file  Social History Narrative   Not on file   Social Determinants of Health   Financial Resource Strain: Low Risk  Difficulty of Paying Living Expenses: Not hard at all  Food Insecurity: No Food Insecurity   Worried About [place] in the Last Year: Never true   Ran Out of Food in the Last Year: Never true  Transportation Needs: No Transportation Needs   Lack of Transportation (Medical): No   Lack of Transportation (Non-Medical): No  Physical Activity: Inactive   Days of Exercise per Week: 0 days   Minutes of Exercise per Session: 0 min  Stress: No Stress Concern Present   Feeling of Stress : Only a little  Social Connections: Moderately Integrated   Frequency of Communication with Friends and Family: More than three times a week   Frequency of Social Gatherings with Friends and Family: Three times a week   Attends Religious Services: More than 4 times per year   Active Member of Clubs or Organizations: No   Attends Archivist Meetings: Never   Marital Status: Married  Human resources officer Violence: Not At Risk   Fear of Current or Ex-Partner: No   Emotionally Abused: No   Physically Abused: No   Sexually Abused: No   Social History   Social History Narrative   Not on file     ROS: Negative.     PE: HEENT: Negative. Lungs: Clear. Cardio: RR.   Assessment/Plan:  Proceed with planned left power port placement.  Forest Gleason [facility] 05/19/2022

## 2022-05-19 NOTE — Discharge Instructions (Signed)

## 2022-05-19 NOTE — Anesthesia Preprocedure Evaluation (Addendum)
Anesthesia Evaluation  Patient identified by MRN, date of birth, ID band Patient awake    Reviewed: Allergy & Precautions, NPO status , Patient's Chart, lab work & pertinent test results  History of Anesthesia Complications Negative for: history of anesthetic complications  Airway Mallampati: III   Neck ROM: Full    Dental  (+) Missing, Chipped   Pulmonary former smoker (quit 1988),    Pulmonary exam normal breath sounds clear to auscultation       Cardiovascular hypertension, Normal cardiovascular exam Rhythm:Regular Rate:Normal  ECG 04/20/22:  Sinus bradycardia (HR 54) Otherwise normal ECG  Echo 10/02/21:  NORMAL LEFT VENTRICULAR SYSTOLIC FUNCTION  WITH MILD LVH  NORMAL RIGHT VENTRICULAR SYSTOLIC FUNCTION  MILD VALVULAR REGURGITATION  NO VALVULAR STENOSIS  IRREGULAR HEART RHYTHM CAPTURED THROUGHOUT EXAM  ESTIMATED LVEF >55%  Aortic: NORMAL GRADIENTS  Mitral: MILD MR  MVP: BOTH AMVL & PMVL  Tricuspid: MILD TR (2.79ms)  Pulmonic: TRIVIAL PI   Myocardial Perfusion 04/2017: Normal myocardial perfusion scan no evidence of stress-induced myocardial-ischemia ejection fraction of 70% conclusion negative scan    Neuro/Psych negative neurological ROS     GI/Hepatic hiatal hernia,   Endo/Other  diabetes, Type 2Obesity   Renal/GU negative Renal ROS     Musculoskeletal   Abdominal   Peds  Hematology Breast CA   Anesthesia Other Findings Cardiology note 02/22/22:  Plan  Patient is stable today.   1. Bradycardia, resolved at this time consider follow-up Holter monitor  2. Palpitations, likely due to occasional episodes of SVT confirmed by Holter  -Recommend continuing to reduce caffeine intake, avoiding the use of energy drinks, staying adequately hydrated with water daily and practicing healthy stress management techniques.  3. Mild mitral valve prolapse - We will continue conservative therapy at this time   -Education reinforced to the patient on the importance of reporting any signs or symptoms of dyspnea, increased fatigue, dizziness or any other cardiac symptoms to our office. She verbalized understanding of all information.  4. Hypertension, reasonably controlled, patient is normotensive on today -We will continue management with triamterene-hydrochlorothiazide - metoprolol as above.  5. Hyperlipidemia, reasonably controlled  -We will continue rosuvastatin therapy.  6. DM type II, reasonably controlled  -Management per endocrinology.  7. Obesity -Recommend modest weight loss, portion control, daily aerobic exercise for least 30 minutes as tolerated. Reviewed medications.  8. Generalized anxiety disorder, reasonably stable -Management per PCP. -Recommend following healthy stress management techniques such as breathing exercises, stretching daily, following a healthy diet and decreasing caffeine intake.  9. Chronic fatigue with recent improvements in symptoms   Follow up in 6 months.   Reproductive/Obstetrics                            Anesthesia Physical Anesthesia Plan  ASA: 2  Anesthesia Plan: General   Post-op Pain Management:    Induction: Intravenous  PONV Risk Score and Plan: 3 and Propofol infusion, TIVA, Treatment may vary due to age or medical condition and Ondansetron  Airway Management Planned: Natural Airway  Additional Equipment:   Intra-op Plan:   Post-operative Plan:   Informed Consent: I have reviewed the patients History and Physical, chart, labs and discussed the procedure including the risks, benefits and alternatives for the proposed anesthesia with the patient or authorized representative who has indicated his/her understanding and acceptance.       Plan Discussed with: CRNA  Anesthesia Plan Comments: (LMA/GETA backup discussed.  Patient consented  for risks of anesthesia including but not limited to:  - adverse  reactions to medications - damage to eyes, teeth, lips or other oral mucosa - nerve damage due to positioning  - sore throat or hoarseness - damage to heart, brain, nerves, lungs, other parts of body or loss of life  Informed patient about role of CRNA in peri- and intra-operative care.  Patient voiced understanding.)        Anesthesia Quick Evaluation

## 2022-05-19 NOTE — Anesthesia Postprocedure Evaluation (Signed)
Anesthesia Post Note  Patient: Morgan Peterson  Procedure(s) Performed: INSERTION PORT-A-CATH (Left)  Patient location during evaluation: PACU Anesthesia Type: General Level of consciousness: awake and alert, oriented and patient cooperative Pain management: pain level controlled Vital Signs Assessment: post-procedure vital signs reviewed and stable Respiratory status: spontaneous breathing, nonlabored ventilation and respiratory function stable Cardiovascular status: blood pressure returned to baseline and stable Postop Assessment: adequate PO intake Anesthetic complications: no   No notable events documented.   Last Vitals:  Vitals:   05/19/22 1058 05/19/22 1100  BP:  126/77  Pulse: (!) 52 (!) 53  Resp: (!) 21 17  Temp: (!) 36.1 C   SpO2: 98% 97%    Last Pain:  Vitals:   05/19/22 1100  TempSrc:   PainSc: 0-No pain                 Darrin Nipper

## 2022-05-20 ENCOUNTER — Telehealth: Payer: Self-pay | Admitting: *Deleted

## 2022-05-20 NOTE — Progress Notes (Signed)
Pharmacist Chemotherapy Monitoring - Initial Assessment    Anticipated start date: 05/27/22   The following has been reviewed per standard work regarding the patient's treatment regimen: The patient's diagnosis, treatment plan and drug doses, and organ/hematologic function Lab orders and baseline tests specific to treatment regimen  The treatment plan start date, drug sequencing, and pre-medications Prior authorization status  Patient's documented medication list, including drug-drug interaction screen and prescriptions for anti-emetics and supportive care specific to the treatment regimen The drug concentrations, fluid compatibility, administration routes, and timing of the medications to be used The patient's access for treatment and lifetime cumulative dose history, if applicable  The patient's medication allergies and previous infusion related reactions, if applicable   Changes made to treatment plan:  treatment plan date  Follow up needed:  Pending authorization for treatment and MD needs to sign treatment plan    Adelina Mings, [place], 05/20/2022  11:41 AM

## 2022-05-20 NOTE — Telephone Encounter (Signed)
Patient called asking about her premeds for chemotherapy, she states she got the Lidocaine and zofran, but the prochlorperazine gat cancelled, Looking at her chart, it looks like Dr Tasia Catchings had entered orders then cancelled , Dr Sharmaine Base orders have that she is to get decadron, compazine zofran, lorazepam, and emla. Please send medications to pharmacy that she will be needing related to her treatment

## 2022-05-20 NOTE — Telephone Encounter (Signed)
Dr. Jacinto Reap, what premeds need to be sent to pharmacy?

## 2022-05-21 ENCOUNTER — Encounter: Payer: Self-pay | Admitting: Internal Medicine

## 2022-05-21 MED ORDER — LIDOCAINE-PRILOCAINE 2.5-2.5 % EX CREA: 1.0000 "application " | TOPICAL_CREAM | CUTANEOUS | 1 refills | Status: DC | PRN

## 2022-05-21 MED ORDER — PROCHLORPERAZINE MALEATE 10 MG PO TABS: 10.0000 mg | ORAL_TABLET | Freq: Four times a day (QID) | ORAL | 1 refills | Status: DC | PRN

## 2022-05-21 MED ORDER — ONDANSETRON HCL 8 MG PO TABS: 8.0000 mg | ORAL_TABLET | Freq: Three times a day (TID) | ORAL | 1 refills | Status: DC | PRN

## 2022-05-21 NOTE — Telephone Encounter (Signed)
Pt.notified

## 2022-05-21 NOTE — Telephone Encounter (Signed)
Pre-meds rx sent to pharmacy.

## 2022-05-24 ENCOUNTER — Telehealth: Payer: Self-pay

## 2022-05-24 NOTE — Telephone Encounter (Addendum)
Research nurse placed call to patient, message left for her to return call if interested in learning more about potential clinical trials she is eligible to participate in- [place] 2021-05 and [place] for Nausea.  Jeral Fruit, RN 05/24/22 11:40 AM  Patient returned call to research today. Research nurse reviewed the protocols mentioned in the previous note briefly. The patients questions were answered to her reported satisfaction. She is not eligible for the [employer] protocol as she has had her definitive surgery already on the 8th of May. She states she will think about the [place] protocol study for nausea and get back with research to let us know of her interest. Patient was thanked for taking the time to reach back out to hear about the research program here at [facility].  Jeral Fruit, RN 05/24/22 4:06 PM

## 2022-05-25 ENCOUNTER — Ambulatory Visit
Admission: RE | Admit: 2022-05-25 | Discharge: 2022-05-25 | Disposition: A | Discharge status: Home / Self Care | Payer: Medicare Other | Source: Ambulatory Visit | Attending: Oncology | Admitting: Oncology

## 2022-05-25 DIAGNOSIS — Z87891 Personal history of nicotine dependence: Secondary | ICD-10-CM | POA: Diagnosis not present

## 2022-05-25 DIAGNOSIS — E785 Hyperlipidemia, unspecified: Secondary | ICD-10-CM | POA: Insufficient documentation

## 2022-05-25 DIAGNOSIS — Z5181 Encounter for therapeutic drug level monitoring: Secondary | ICD-10-CM | POA: Diagnosis not present

## 2022-05-25 DIAGNOSIS — E119 Type 2 diabetes mellitus without complications: Secondary | ICD-10-CM | POA: Diagnosis not present

## 2022-05-25 DIAGNOSIS — I10 Essential (primary) hypertension: Secondary | ICD-10-CM | POA: Diagnosis present

## 2022-05-25 DIAGNOSIS — Z79899 Other long term (current) drug therapy: Secondary | ICD-10-CM | POA: Insufficient documentation

## 2022-05-25 LAB — ECHOCARDIOGRAM COMPLETE
AR max vel: 1.98 cm2
AV Area VTI: 1.94 cm2
AV Area mean vel: 1.74 cm2
AV Mean grad: 5 mmHg
AV Peak grad: 7.7 mmHg
Ao pk vel: 1.39 m/s
Area-P 1/2: 2.67 cm2
MV VTI: 1.33 cm2
S' Lateral: 1.99 cm

## 2022-05-26 ENCOUNTER — Encounter: Payer: Self-pay | Admitting: Internal Medicine

## 2022-05-26 ENCOUNTER — Inpatient Hospital Stay: Payer: Medicare Other

## 2022-05-26 ENCOUNTER — Inpatient Hospital Stay: Payer: Medicare Other | Attending: Oncology | Admitting: Hospice and Palliative Medicine

## 2022-05-26 ENCOUNTER — Inpatient Hospital Stay (HOSPITAL_BASED_OUTPATIENT_CLINIC_OR_DEPARTMENT_OTHER): Payer: Medicare Other | Admitting: Internal Medicine

## 2022-05-26 DIAGNOSIS — Z171 Estrogen receptor negative status [ER-]: Secondary | ICD-10-CM | POA: Diagnosis not present

## 2022-05-26 DIAGNOSIS — Z5111 Encounter for antineoplastic chemotherapy: Secondary | ICD-10-CM | POA: Insufficient documentation

## 2022-05-26 DIAGNOSIS — Z79899 Other long term (current) drug therapy: Secondary | ICD-10-CM | POA: Diagnosis not present

## 2022-05-26 DIAGNOSIS — C50411 Malignant neoplasm of upper-outer quadrant of right female breast: Secondary | ICD-10-CM

## 2022-05-26 DIAGNOSIS — Z9011 Acquired absence of right breast and nipple: Secondary | ICD-10-CM | POA: Diagnosis not present

## 2022-05-26 DIAGNOSIS — Z5189 Encounter for other specified aftercare: Secondary | ICD-10-CM | POA: Diagnosis not present

## 2022-05-26 DIAGNOSIS — C50811 Malignant neoplasm of overlapping sites of right female breast: Secondary | ICD-10-CM

## 2022-05-26 DIAGNOSIS — Z923 Personal history of irradiation: Secondary | ICD-10-CM | POA: Insufficient documentation

## 2022-05-26 DIAGNOSIS — Z9221 Personal history of antineoplastic chemotherapy: Secondary | ICD-10-CM | POA: Insufficient documentation

## 2022-05-26 DIAGNOSIS — Z7982 Long term (current) use of aspirin: Secondary | ICD-10-CM | POA: Diagnosis not present

## 2022-05-26 DIAGNOSIS — Z87891 Personal history of nicotine dependence: Secondary | ICD-10-CM | POA: Diagnosis not present

## 2022-05-26 LAB — CBC WITH DIFFERENTIAL/PLATELET
Abs Immature Granulocytes: 0.01 10*3/uL (ref 0.00–0.07)
Basophils Absolute: 0 10*3/uL (ref 0.0–0.1)
Basophils Relative: 1 %
Eosinophils Absolute: 0.2 10*3/uL (ref 0.0–0.5)
Eosinophils Relative: 3 %
HCT: 37.3 % (ref 36.0–46.0)
Hemoglobin: 11.6 g/dL — ABNORMAL LOW (ref 12.0–15.0)
Immature Granulocytes: 0 %
Lymphocytes Relative: 39 %
Lymphs Abs: 2.5 10*3/uL (ref 0.7–4.0)
MCH: 27.7 pg (ref 26.0–34.0)
MCHC: 31.1 g/dL (ref 30.0–36.0)
MCV: 89 fL (ref 80.0–100.0)
Monocytes Absolute: 0.5 10*3/uL (ref 0.1–1.0)
Monocytes Relative: 8 %
Neutro Abs: 3.3 10*3/uL (ref 1.7–7.7)
Neutrophils Relative %: 49 %
Platelets: 273 10*3/uL (ref 150–400)
RBC: 4.19 MIL/uL (ref 3.87–5.11)
RDW: 13.2 % (ref 11.5–15.5)
WBC: 6.6 10*3/uL (ref 4.0–10.5)
nRBC: 0 % (ref 0.0–0.2)

## 2022-05-26 LAB — COMPREHENSIVE METABOLIC PANEL
ALT: 11 U/L (ref 0–44)
AST: 19 U/L (ref 15–41)
Albumin: 3.7 g/dL (ref 3.5–5.0)
Alkaline Phosphatase: 85 U/L (ref 38–126)
Anion gap: 7 (ref 5–15)
BUN: 9 mg/dL (ref 8–23)
CO2: 29 mmol/L (ref 22–32)
Calcium: 8.7 mg/dL — ABNORMAL LOW (ref 8.9–10.3)
Chloride: 101 mmol/L (ref 98–111)
Creatinine, Ser: 0.75 mg/dL (ref 0.44–1.00)
GFR, Estimated: >60 mL/min (ref >60)
Glucose, Bld: 108 mg/dL — ABNORMAL HIGH (ref 70–99)
Potassium: 3.4 mmol/L — ABNORMAL LOW (ref 3.5–5.1)
Sodium: 137 mmol/L (ref 135–145)
Total Bilirubin: 0.5 mg/dL (ref 0.3–1.2)
Total Protein: 7.4 g/dL (ref 6.5–8.1)

## 2022-05-26 LAB — TSH: TSH: 1.41 u[IU]/mL (ref 0.350–4.500)

## 2022-05-26 NOTE — Progress Notes (Signed)
Pt would like to know if she can still have her nails done since she will be starting treatment?

## 2022-05-26 NOTE — Assessment & Plan Note (Addendum)
#  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 ]- s/p mastectomy.  No role for radiation.  #I reviewed the staging pathology with the patient in detail. Given the triple negative nature of the disease; grade 3; lymph node present-I think it is reasonable to consider adjuvant chemo-immunotherapy [KEYNOTE ].  Discussed that goal of treatment is cure.   # I reviewed at length the individual components with chemotherapy; and the schedule in detail.  I also discussed the potential side effects including but not limited to-increasing fatigue, nausea vomiting, diarrhea, hair loss, sores in the mouth, increase risk of infection and also neuropathy.  Also reviewed the multiple strategies to avoid/mitigate similar side effects including preemptive medications-for nausea vomiting. Discussed the potential side effects of immunotherapy including but not limited to diarrhea; skin rash; elevated LFTs/endocrine abnormalities etc. however the risk of serious side effects less than 10%.   # Labs today reviewed;  acceptable for treatment tomorrow.  TSH-pending. June 6th- ECHO- 05/25/2022- 65-70%.   #Genetic counseling: Family history of breast cancer; no prior history of breast cancer.   # DISPOSITION: #  6/08- carbo-taxol-keytruda # D-8 labs- cbc/cmp; taxol # D-15-MD;  labs-cbc/cmp;taxol # Zarxio- 6/23 & 6/24 & 6/25.  # Follow up MD- 6/27/Tuesday-;labs- cbc/cmp MD carbo-taxol-keytruda- Dr.B

## 2022-05-26 NOTE — Progress Notes (Signed)
Multidisciplinary Oncology Council Documentation  Morgan Peterson was presented by our [facility] on 05/26/2022, which included representatives from:  Palliative Care Dietitian  Physical/Occupational Therapist Nurse Navigator Genetics Speech Therapist Social work Survivorship RN Financial Navigator Research RN   Morgan Peterson currently presents with history of breast cancer  We reviewed previous medical and familial history, history of present illness, and recent lab results along with all available histopathologic and imaging studies. [facility] [place] considered available treatment options and made the following recommendations/referrals:  Rehab screening, nutrition  The MOC is a meeting of clinicians from various specialty areas who evaluate and discuss patients for whom a multidisciplinary approach is being considered. Final determinations in the plan of care are those of the provider(s).   Today's extended care, comprehensive team conference, Morgan Peterson was not present for the discussion and was not examined.

## 2022-05-26 NOTE — Progress Notes (Signed)
[name] NOTE  Patient Care Team: Steele Sizer, MD as PCP - General (Family Medicine) Bary Castilla, Forest Gleason, MD (General Surgery) Carloyn Manner, MD as Referring Physician (Otolaryngology) Yolonda Kida, MD as Consulting Physician (Cardiology) Ree Edman, MD as Referring Physician (Dermatology) Lonia Farber, MD as Consulting Physician (Internal Medicine) Cammie Sickle, MD as Consulting Physician (Internal Medicine)  CHIEF COMPLAINTS/PURPOSE OF CONSULTATION: Breast cancer  #  Oncology History Overview Note  She has a remote history of right breast cancer, T1bN0, ER positive,s/p lumpectomy and MammoSite radiation and 5 years of Arimidex.    02/19/22 screening mammogram bilaterally showed  1. Suspicious right breast mass at the 10 o'clock position 8 cm from the nipple. Recommend ultrasound-guided biopsy. 2. Indeterminate right breast mass at the 6 o'clock position 4 cm from the nipple. Recommend ultrasound-guided biopsy. 3. No suspicious right axillary lymphadenopathy.   04/12/2022 Unilateral right breast diagnostic mammogram showed 1. Suspicious right breast mass at the 10 o'clock position 8 cm from the nipple. Recommend ultrasound-guided biopsy. 2. Indeterminate right breast mass at the 6 o'clock position 4 cm from the nipple. Recommend ultrasound-guided biopsy. 3. No suspicious right axillary lymphadenopathy.   04/13/2022 right breast mass 6 o'clock position 1 cm from the nipple showed invasive mammary carcinoma, no special type. Grade 3, DCIS present high grade, LVI not identified. ER-, PR 1-10%, HER 2 -   04/13/2022, right breast mass 10:00 7 cm from nipple biopsy showed invasive mammary carcinoma, grade 3, high-grade DCIS with focal comedonecrosis, lymphovascular invasion not identified.  ER -, PR 1 to 10%, HER2-    04/26/2022, right breast mastectomy with axillary dissection Invasive mammary carcinoma, no special type, multifocal, DCIS  high-grade, benign nipple/areola.  2 deposits of invasive mammary carcinoma, no definite residual lymph node identified.   Breast cancer ([place])  05/10/2022 Initial Diagnosis   Breast cancer ([place])    05/10/2022 Cancer Staging   Staging form: Breast, AJCC 8th Edition - Pathologic stage from 05/10/2022: Stage IIA (pT1c, pN1a, cM0, G3, ER-, PR+, HER2-) - Signed by Earlie Server, MD on 05/10/2022 Stage prefix: Initial diagnosis Histologic grading system: 3 grade system    05/24/2022 - 05/24/2022 Chemotherapy   Patient is on Treatment Plan : BREAST Pembrolizumab (200) D1 + Carboplatin (5) D1 + Paclitaxel (80) D1,8,15 q21d X 4 cycles / Pembrolizumab (200) D1 + AC D1 q21d x 4 cycles      05/27/2022 -  Chemotherapy   Patient is on Treatment Plan : BREAST Pembrolizumab (200) D1 + Carboplatin (5) D1 + Paclitaxel (80) D1,8,15 q21d X 4 cycles / Pembrolizumab (200) D1 + AC D1 q21d x 4 cycles      Carcinoma of upper-outer quadrant of right breast in female, estrogen receptor negative ([place])  05/26/2022 Initial Diagnosis   Carcinoma of upper-outer quadrant of right breast in female, estrogen receptor negative ([place])    05/26/2022 Cancer Staging   Staging form: Breast, AJCC 8th Edition - Pathologic: Stage IIA (pT1c, pN1, cM0, G3, ER-, PR-, HER2-) - Signed by Cammie Sickle, MD on 05/26/2022 Multigene prognostic tests performed: None Histologic grading system: 3 grade system       HISTORY OF PRESENTING ILLNESS: Ambulating independently.  Alone.  Morgan Peterson 71 y.o.  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  Patient was recently evaluated by Dr. Tasia Catchings from medical oncology.   In the interim patient had a Mediport placement.  also had a  2D echo yesterday.   Patient denies any new complaints at this time.   Review of Systems  Constitutional:  Negative for chills, diaphoresis, fever, malaise/fatigue and weight loss.  HENT:   Negative for nosebleeds and sore throat.   Eyes:  Negative for double vision.  Respiratory:  Negative for cough, hemoptysis, sputum production, shortness of breath and wheezing.   Cardiovascular:  Negative for chest pain, palpitations, orthopnea and leg swelling.  Gastrointestinal:  Negative for abdominal pain, blood in stool, constipation, diarrhea, heartburn, melena, nausea and vomiting.  Genitourinary:  Negative for dysuria, frequency and urgency.  Musculoskeletal:  Negative for back pain and joint pain.  Skin: Negative.  Negative for itching and rash.  Neurological:  Negative for dizziness, tingling, focal weakness, weakness and headaches.  Endo/Heme/Allergies:  Does not bruise/bleed easily.  Psychiatric/Behavioral:  Negative for depression. The patient is not nervous/anxious and does not have insomnia.     MEDICAL HISTORY:  Past Medical History:  Diagnosis Date   Cervical radiculopathy    Hiatal hernia    Hypercholesterolemia    Hyperlipidemia    Hypertension    IBS (irritable bowel syndrome)    Malignant neoplasm of upper-outer quadrant of female breast ([place]) 04/11/2009   Right breast, invasive ductal carcinoma, 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 Arimidex therapy completed September 2015   Mild mitral valve prolapse    Personal history of malignant neoplasm of breast 2010   Personal history of radiation therapy 2010   mammosite   T2DM (type 2 diabetes mellitus) ([place]) 2011    SURGICAL HISTORY: Past Surgical History:  Procedure Laterality Date   ABDOMINAL EXPLORATION SURGERY  2000   ovarian cyst   BREAST BIOPSY Right 2010   +    BREAST EXCISIONAL BIOPSY Right 2010   + mammo site inasive mammo ca   BREAST LUMPECTOMY Right 2010   [facility]   COLONOSCOPY  07/19/2012   Normal exam, Dr. Bary Castilla   COLONOSCOPY WITH PROPOFOL N/A 04/21/2022   Procedure: COLONOSCOPY WITH PROPOFOL;  Surgeon: Robert Bellow, MD;  Location:  [facility] ENDOSCOPY;  Service: Endoscopy;  Laterality: N/A;   MASTECTOMY Right 2010   partial/ lumpectomy   PORTACATH PLACEMENT Left 05/19/2022   Procedure: INSERTION PORT-A-CATH;  Surgeon: Robert Bellow, MD;  Location: [facility] ORS;  Service: General;  Laterality: Left;   SIMPLE MASTECTOMY WITH AXILLARY SENTINEL NODE BIOPSY Right 04/26/2022   Procedure: SIMPLE MASTECTOMY WITH AXILLARY SENTINEL NODE BIOPSY;  Surgeon: Robert Bellow, MD;  Location: [facility] ORS;  Service: General;  Laterality: Right;  RNFA to assist   TUBAL LIGATION  20 years ago    SOCIAL HISTORY: Social History   Socioeconomic History   Marital status: Married    Spouse name: Antonio   Number of children: 2   Years of education: Not on file   Highest education level: Not on file  Occupational History   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  Tobacco Use   Smoking status: Former    Packs/day: 0.25    Years: 10.00    Pack years: 2.50    Types: Cigarettes    Start date: 1978    Quit date: 1988    Years since quitting: 35.4   Smokeless tobacco: Never   Tobacco comments:    smoking cessation materials not required  Vaping Use   Vaping Use: Never used  Substance and Sexual Activity   Alcohol use:  Yes    Comment: wine occ   Drug use: No   Sexual activity: Not Currently    Comment: husband has ED  Other Topics Concern   Not on file  Social History Narrative   Oncology nurse on the floor retired.;  Husband retired from [employer].  No smoking.   Social Determinants of Health   Financial Resource Strain: Low Risk    Difficulty of Paying Living Expenses: Not hard at all  Food Insecurity: No Food Insecurity   Worried About Charity fundraiser in the Last Year: Never true   [place] in the Last Year: Never true  Transportation Needs: No Transportation Needs   Lack of Transportation (Medical): No   Lack of Transportation (Non-Medical): No  Physical Activity: Inactive   Days of Exercise per Week: 0  days   Minutes of Exercise per Session: 0 min  Stress: No Stress Concern Present   Feeling of Stress : Only a little  Social Connections: Moderately Integrated   Frequency of Communication with Friends and Family: More than three times a week   Frequency of Social Gatherings with Friends and Family: Three times a week   Attends Religious Services: More than 4 times per year   Active Member of Clubs or Organizations: No   Attends Archivist Meetings: Never   Marital Status: Married  Human resources officer Violence: Not At Risk   Fear of Current or Ex-Partner: No   Emotionally Abused: No   Physically Abused: No   Sexually Abused: No    FAMILY HISTORY: Family History  Problem Relation Age of Onset   Osteoporosis Mother    Diabetes Father    Kidney disease Father    Diabetes Brother    Breast cancer Maternal Aunt    Bladder Cancer Maternal Aunt    Cervical cancer Maternal Aunt    Colon cancer Maternal Uncle    Lung cancer Maternal Uncle     ALLERGIES:  is allergic to levaquin [levofloxacin in d5w], losartan, metoprolol, amlodipine-olmesartan, canagliflozin, egg white [egg white (egg protein)], gabapentin, glipizide, gluten meal, lac bovis, lisinopril, metformin and related, milk-related compounds, onglyza [saxagliptin], shrimp extract allergy skin test, tramadol, actos [pioglitazone], atorvastatin, carvedilol, and prednisone.  MEDICATIONS:  Current Outpatient Medications  Medication Sig Dispense Refill   aspirin EC 81 MG tablet Take 1 tablet (81 mg total) by mouth daily. 30 tablet 0   atenolol (TENORMIN) 25 MG tablet Take 1 tablet (25 mg total) by mouth daily. (Patient taking differently: Take 25 mg by mouth every morning.) 90 tablet 1   azelastine (ASTELIN) 0.1 % nasal spray Place 2 sprays into both nostrils daily as needed for allergies or rhinitis. Use in each nostril as directed     betamethasone, augmented, (DIPROLENE) 0.05 % lotion Apply 1 application topically 2 (two)  times daily.     Blood Glucose Monitoring Suppl (ONETOUCH VERIO) w/Device KIT 1 Device by Does not apply route once. 1 kit 0   cholecalciferol (VITAMIN D) 1000 UNITS tablet Take 1,000 Units by mouth daily.     Cyanocobalamin (VITAMIN B-12 PO) Take 1 tablet by mouth daily as needed (fatigue).     famotidine (PEPCID) 20 MG tablet Take 20 mg by mouth 2 (two) times daily.     fluocinonide (LIDEX) 0.05 % external solution Apply 1 application. topically daily.     glucose blood (ONETOUCH VERIO) test strip Use as instructed 100 each 12   HYDROcodone-acetaminophen (NORCO/VICODIN) 5-325 MG tablet Take 1  tablet by mouth every 4 (four) hours as needed for moderate pain. 15 tablet 0   Lancets (ONETOUCH ULTRASOFT) lancets Use as instructed 100 each 3   levocetirizine (XYZAL) 5 MG tablet Take 1 tablet (5 mg total) by mouth every evening. (Patient taking differently: Take 5 mg by mouth daily as needed for allergies.) 90 tablet 1   lidocaine-prilocaine (EMLA) cream Apply 1 application. topically as needed. Apply to port and cover with saran wrap 1-2 hours prior to port access 30 g 1   montelukast (SINGULAIR) 10 MG tablet Take 1 tablet (10 mg total) by mouth at bedtime. (Patient taking differently: Take 10 mg by mouth at bedtime as needed (allergies).) 90 tablet 1   Multiple Vitamin (MULTIVITAMIN WITH MINERALS) TABS tablet Take 1 tablet by mouth 2 (two) times a week.     ondansetron (ZOFRAN) 8 MG tablet Take 1 tablet (8 mg total) by mouth every 8 (eight) hours as needed for nausea or vomiting. 40 tablet 1   OZEMPIC, 0.25 OR 0.5 MG/DOSE, 2 MG/1.5ML SOPN Inject 0.5 mg into the skin every Sunday.     prochlorperazine (COMPAZINE) 10 MG tablet Take 1 tablet (10 mg total) by mouth every 6 (six) hours as needed for nausea or vomiting. 40 tablet 1   rizatriptan (MAXALT-MLT) 10 MG disintegrating tablet Take 5-10 mg by mouth as needed for migraine. May repeat in 2 hours if needed     rosuvastatin (CRESTOR) 10 MG tablet Take 1  tablet (10 mg total) by mouth daily. (Patient taking differently: Take 10 mg by mouth every morning.) 90 tablet 1   triamterene-hydrochlorothiazide (MAXZIDE-25) 37.5-25 MG tablet TAKE 1 TABLET BY MOUTH EVERY DAY (Patient taking differently: Take 1 tablet by mouth every morning.) 90 tablet 0   EPINEPHrine (EPIPEN 2-PAK) 0.3 mg/0.3 mL IJ SOAJ injection Inject 0.3 mg into the muscle as needed for anaphylaxis. (Patient not taking: Reported on 04/26/2022) 2 each 0   No current facility-administered medications for this visit.      Marland Kitchen  PHYSICAL EXAMINATION: ECOG PERFORMANCE STATUS: 0 - Asymptomatic  Vitals:   05/26/22 1451  BP: (!) 148/81  Pulse: 73  Temp: 98.1 F (36.7 C)  SpO2: 98%   Filed Weights   05/26/22 1451  Weight: 199 lb 3.2 oz (90.4 kg)    Physical Exam Vitals and nursing note reviewed.  HENT:     Head: Normocephalic and atraumatic.     Mouth/Throat:     Pharynx: Oropharynx is clear.  Eyes:     Extraocular Movements: Extraocular movements intact.     Pupils: Pupils are equal, round, and reactive to light.  Cardiovascular:     Rate and Rhythm: Normal rate and regular rhythm.  Pulmonary:     Comments: Decreased breath sounds bilaterally.  Abdominal:     Palpations: Abdomen is soft.  Musculoskeletal:        General: Normal range of motion.     Cervical back: Normal range of motion.  Skin:    General: Skin is warm.  Neurological:     General: No focal deficit present.     Mental Status: She is alert and oriented to person, place, and time.  Psychiatric:        Behavior: Behavior normal.        Judgment: Judgment normal.     LABORATORY DATA:  I have reviewed the data as listed Lab Results  Component Value Date   WBC 6.6 05/26/2022   HGB 11.6 (L) 05/26/2022  HCT 37.3 05/26/2022   MCV 89.0 05/26/2022   PLT 273 05/26/2022   Recent Labs    12/31/21 1005 04/20/22 0913 05/10/22 1121 05/26/22 1445  NA 142 141 138 137  K 3.8 3.7 3.9 3.4*  CL 105 102 101  101  CO2 '30 30 31 29  ' GLUCOSE 111* 90 149* 108*  BUN '9 9 10 9  ' CREATININE 0.81 0.83 0.83 0.75  CALCIUM 9.3 9.2 8.7* 8.7*  GFRNONAA  --  >60 >60 >60  PROT 6.8  --  7.2 7.4  ALBUMIN  --   --  3.3* 3.7  AST 15  --  18 19  ALT 10  --  14 11  ALKPHOS  --   --  81 85  BILITOT 0.4  --  0.3 0.5    RADIOGRAPHIC STUDIES: I have personally reviewed the radiological images as listed and agreed with the findings in the report. DG Chest Port 1 View  Result Date: 05/19/2022 CLINICAL DATA:  Post port placement EXAM: PORTABLE CHEST 1 VIEW COMPARISON:  September 2021 FINDINGS: Left chest wall port catheter tip overlies the superior right atrium. Lung volumes are low. There is retrocardiac density likely reflecting combination of hiatal hernia and atelectasis. Possible small left pleural effusion. No pneumothorax. Similar cardiomediastinal contours. IMPRESSION: Left chest wall port with catheter tip overlying the superior right atrium. No pneumothorax. Retrocardiac atelectasis.  Possible small left pleural effusion. Electronically Signed   By: Macy Mis M.D.   On: 05/19/2022 10:39   DG C-Arm 1-60 Min-No Report  Result Date: 05/19/2022 Fluoroscopy was utilized by the requesting physician.  No radiographic interpretation.   ECHOCARDIOGRAM COMPLETE  Result Date: 05/25/2022    ECHOCARDIOGRAM REPORT   Patient Name:   Morgan Peterson Date of Exam: 05/25/2022 Medical Rec #:  [id number]        Height:       66.0 in Accession #:    [id number]       Weight:       200.0 lb Date of Birth:  1951-04-28        BSA:          2.000 m Patient Age:    59 years         BP:           115/76 mmHg Patient Gender: F                HR:           68 bpm. Exam Location:  [facility] Procedure: 2D Echo, Cardiac Doppler, Color Doppler and Strain Analysis Indications:     Z09 Chemo  History:         Patient has no prior history of Echocardiogram examinations.                  Risk Factors:Hypertension, Dyslipidemia, Diabetes and Former                   Smoker.  Sonographer:     Rosalia Hammers Referring Phys:  [id number] [place] [facility] Diagnosing Phys: Serafina Royals MD  Sonographer Comments: Suboptimal apical window and suboptimal subcostal window. Image acquisition challenging due to patient body habitus. Global longitudinal strain was attempted. IMPRESSIONS  1. Left ventricular ejection fraction, by estimation, is 65 to 70%. The left ventricle has normal function. The left ventricle has no regional wall motion abnormalities. Left ventricular diastolic parameters were normal.  2. Right ventricular systolic function is normal. The right ventricular size  is normal.  3. The mitral valve is normal in structure. Trivial mitral valve regurgitation.  4. The aortic valve is normal in structure. Aortic valve regurgitation is not visualized. FINDINGS  Left Ventricle: Left ventricular ejection fraction, by estimation, is 65 to 70%. The left ventricle has normal function. The left ventricle has no regional wall motion abnormalities. The left ventricular internal cavity size was small. There is no left ventricular hypertrophy. Left ventricular diastolic parameters were normal. Right Ventricle: The right ventricular size is normal. No increase in right ventricular wall thickness. Right ventricular systolic function is normal. Left Atrium: Left atrial size was normal in size. Right Atrium: Right atrial size was normal in size. Pericardium: There is no evidence of pericardial effusion. Mitral Valve: The mitral valve is normal in structure. Trivial mitral valve regurgitation. MV peak gradient, 8.8 mmHg. The mean mitral valve gradient is 3.0 mmHg. Tricuspid Valve: The tricuspid valve is normal in structure. Tricuspid valve regurgitation is trivial. Aortic Valve: The aortic valve is normal in structure. Aortic valve regurgitation is not visualized. Aortic valve mean gradient measures 5.0 mmHg. Aortic valve peak gradient measures 7.7 mmHg. Aortic valve area, by VTI measures 1.94 cm.  Pulmonic Valve: The pulmonic valve was normal in structure. Pulmonic valve regurgitation is not visualized. Aorta: The aortic root and ascending aorta are structurally normal, with no evidence of dilitation. IAS/Shunts: No atrial level shunt detected by color flow Doppler.  LEFT VENTRICLE PLAX 2D LVIDd:         2.82 cm   Diastology LVIDs:         1.99 cm   LV e' medial:    7.62 cm/s LV PW:         1.10 cm   LV E/e' medial:  17.3 LV IVS:        1.13 cm   LV e' lateral:   7.62 cm/s LVOT diam:     1.80 cm   LV E/e' lateral: 17.3 LV SV:         61 LV SV Index:   30 LVOT Area:     2.54 cm                           3D Volume EF:                          3D EF:        51 %                          LV EDV:       223 ml                          LV ESV:       110 ml                          LV SV:        113 ml RIGHT VENTRICLE RV Basal diam:  3.88 cm RV S prime:     16.40 cm/s LEFT ATRIUM             Index        RIGHT ATRIUM           Index LA diam:        2.70 cm 1.35 cm/m  RA Area:     14.00 cm LA Vol (A2C):   69.3 ml 34.66 ml/m  RA Volume:   32.40 ml  16.20 ml/m LA Vol (A4C):   33.9 ml 16.95 ml/m LA Biplane Vol: 49.4 ml 24.70 ml/m  AORTIC VALVE                     PULMONIC VALVE AV Area (Vmax):    1.98 cm      PV Vmax:       1.12 m/s AV Area (Vmean):   1.74 cm      PV Vmean:      80.300 cm/s AV Area (VTI):     1.94 cm      PV VTI:        0.271 m AV Vmax:           139.00 cm/s   PV Peak grad:  5.0 mmHg AV Vmean:          104.000 cm/s  PV Mean grad:  3.0 mmHg AV VTI:            0.312 m AV Peak Grad:      7.7 mmHg AV Mean Grad:      5.0 mmHg LVOT Vmax:         108.00 cm/s LVOT Vmean:        71.200 cm/s LVOT VTI:          0.238 m LVOT/AV VTI ratio: 0.76  AORTA Ao Root diam: 3.00 cm MITRAL VALVE                TRICUSPID VALVE MV Area (PHT): 2.67 cm     TR Peak grad:   31.6 mmHg MV Area VTI:   1.33 cm     TR Vmax:        281.00 cm/s MV Peak grad:  8.8 mmHg MV Mean grad:  3.0 mmHg     SHUNTS MV Vmax:       1.48 m/s      Systemic VTI:  0.24 m MV Vmean:      85.4 cm/s    Systemic Diam: 1.80 cm MV Decel Time: 284 msec MV E velocity: 132.00 cm/s MV A velocity: 153.00 cm/s MV E/A ratio:  0.86 Serafina Royals MD Electronically signed by Serafina Royals MD Signature Date/Time: 05/25/2022/1:01:06 PM    Final     ASSESSMENT & PLAN:   Carcinoma of upper-outer quadrant of right breast in female, estrogen receptor negative ([place]) #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 ]- s/p mastectomy.  No role for radiation.  #I reviewed the staging pathology with the patient in detail. Given the triple negative nature of the disease; grade 3; lymph node present-I think it is reasonable to consider adjuvant chemo-immunotherapy [KEYNOTE ].  Discussed that goal of treatment is cure.   # I reviewed at length the individual components with chemotherapy; and the schedule in detail.  I also discussed the potential side effects including but not limited to-increasing fatigue, nausea vomiting, diarrhea, hair loss, sores in the mouth, increase risk of infection and also neuropathy.  Also reviewed the multiple strategies to avoid/mitigate similar side effects including preemptive medications-for nausea vomiting. Discussed the potential side effects of immunotherapy including but not limited to diarrhea; skin rash; elevated LFTs/endocrine abnormalities etc. however the risk of serious side effects less than 10%.   # Labs today reviewed;  acceptable for treatment tomorrow.  TSH-pending. June 6th- ECHO- 05/25/2022- 65-70%.   #  Genetic counseling: Family history of breast cancer; no prior history of breast cancer.   # DISPOSITION: #  6/08- carbo-taxol-keytruda # D-8 labs- cbc/cmp; taxol # D-15-MD;  labs-cbc/cmp;taxol # Zarxio- 6/23 & 6/24 & 6/25.  # Follow up MD- 6/27/Tuesday-;labs- cbc/cmp MD carbo-taxol-keytruda- Dr.B     All questions were answered. The patient/family knows to call the clinic with any problems, questions or  concerns.       Cammie Sickle, MD 05/26/2022 4:05 PM

## 2022-05-27 ENCOUNTER — Inpatient Hospital Stay: Payer: Medicare Other | Attending: Internal Medicine

## 2022-05-27 ENCOUNTER — Telehealth: Payer: Self-pay | Admitting: Internal Medicine

## 2022-05-27 VITALS — BP 136/72 | HR 79 | Temp 97.2°F | Resp 16 | Wt 198.2 lb

## 2022-05-27 DIAGNOSIS — Z5112 Encounter for antineoplastic immunotherapy: Secondary | ICD-10-CM | POA: Diagnosis present

## 2022-05-27 DIAGNOSIS — Z5111 Encounter for antineoplastic chemotherapy: Secondary | ICD-10-CM | POA: Diagnosis present

## 2022-05-27 DIAGNOSIS — C50411 Malignant neoplasm of upper-outer quadrant of right female breast: Secondary | ICD-10-CM | POA: Insufficient documentation

## 2022-05-27 DIAGNOSIS — Z171 Estrogen receptor negative status [ER-]: Secondary | ICD-10-CM | POA: Diagnosis not present

## 2022-05-27 DIAGNOSIS — C50811 Malignant neoplasm of overlapping sites of right female breast: Secondary | ICD-10-CM

## 2022-05-27 MED ORDER — SODIUM CHLORIDE 0.9 % IV SOLN
150.0000 mg | Freq: Once | INTRAVENOUS | Status: AC
  Administered 2022-05-27: 150 mg via INTRAVENOUS
  Filled 2022-05-27: qty 150

## 2022-05-27 MED ORDER — SODIUM CHLORIDE 0.9 % IV SOLN
200.0000 mg | Freq: Once | INTRAVENOUS | Status: AC
  Administered 2022-05-27: 200 mg via INTRAVENOUS
  Filled 2022-05-27: qty 8

## 2022-05-27 MED ORDER — HEPARIN SOD (PORK) LOCK FLUSH 100 UNIT/ML IV SOLN
500.0000 [IU] | Freq: Once | INTRAVENOUS | Status: AC | PRN
  Administered 2022-05-27: 500 [IU]
  Filled 2022-05-27: qty 5

## 2022-05-27 MED ORDER — SODIUM CHLORIDE 0.9 % IV SOLN
500.0000 mg | Freq: Once | INTRAVENOUS | Status: AC
  Administered 2022-05-27: 500 mg via INTRAVENOUS
  Filled 2022-05-27: qty 50

## 2022-05-27 MED ORDER — SODIUM CHLORIDE 0.9 % IV SOLN
10.0000 mg | Freq: Once | INTRAVENOUS | Status: AC
  Administered 2022-05-27: 10 mg via INTRAVENOUS
  Filled 2022-05-27: qty 10

## 2022-05-27 MED ORDER — DIPHENHYDRAMINE HCL 50 MG/ML IJ SOLN
50.0000 mg | Freq: Once | INTRAMUSCULAR | Status: AC
  Administered 2022-05-27: 50 mg via INTRAVENOUS
  Filled 2022-05-27: qty 1

## 2022-05-27 MED ORDER — FAMOTIDINE IN NACL 20-0.9 MG/50ML-% IV SOLN
20.0000 mg | Freq: Once | INTRAVENOUS | Status: DC
  Filled 2022-05-27: qty 50

## 2022-05-27 MED ORDER — SODIUM CHLORIDE 0.9 % IV SOLN
80.0000 mg/m2 | Freq: Once | INTRAVENOUS | Status: AC
  Administered 2022-05-27: 162 mg via INTRAVENOUS
  Filled 2022-05-27: qty 27

## 2022-05-27 MED ORDER — SODIUM CHLORIDE 0.9 % IV SOLN
Freq: Once | INTRAVENOUS | Status: AC
  Filled 2022-05-27: qty 250

## 2022-05-27 MED ORDER — PALONOSETRON HCL INJECTION 0.25 MG/5ML
0.2500 mg | Freq: Once | INTRAVENOUS | Status: AC
  Administered 2022-05-27: 0.25 mg via INTRAVENOUS
  Filled 2022-05-27: qty 5

## 2022-05-27 NOTE — Patient Instructions (Signed)
[facility] [place]  Discharge Instructions: Thank you for choosing [facility] to provide your oncology and hematology care.  If you have a lab appointment with the [facility], please go directly to the [facility] and check in at the registration area.  Wear comfortable clothing and clothing appropriate for easy access to any Portacath or PICC line.   We strive to give you quality time with your provider. You may need to reschedule your appointment if you arrive late (15 or more minutes).  Arriving late affects you and other patients whose appointments are after yours.  Also, if you miss three or more appointments without notifying the office, you may be dismissed from the clinic at the provider's discretion.      For prescription refill requests, have your pharmacy contact our office and allow 72 hours for refills to be completed.    Today you received the following chemotherapy and/or immunotherapy agents Taxol, Carboplatin, Keytruda      To help prevent nausea and vomiting after your treatment, we encourage you to take your nausea medication as directed.  BELOW ARE SYMPTOMS THAT SHOULD BE REPORTED IMMEDIATELY: *FEVER GREATER THAN 100.4 F (38 C) OR HIGHER *CHILLS OR SWEATING *NAUSEA AND VOMITING THAT IS NOT CONTROLLED WITH YOUR NAUSEA MEDICATION *UNUSUAL SHORTNESS OF BREATH *UNUSUAL BRUISING OR BLEEDING *URINARY PROBLEMS (pain or burning when urinating, or frequent urination) *BOWEL PROBLEMS (unusual diarrhea, constipation, pain near the anus) TENDERNESS IN MOUTH AND THROAT WITH OR WITHOUT PRESENCE OF ULCERS (sore throat, sores in mouth, or a toothache) UNUSUAL RASH, SWELLING OR PAIN  UNUSUAL VAGINAL DISCHARGE OR ITCHING   Items with * indicate a potential emergency and should be followed up as soon as possible or go to the Emergency Department if any problems should occur.  Please show the CHEMOTHERAPY ALERT CARD or IMMUNOTHERAPY ALERT  CARD at check-in to the Emergency Department and triage nurse.  Should you have questions after your visit or need to cancel or reschedule your appointment, please contact [facility] [place] [facility] [place]  [phone] and follow the prompts.  Office hours are 8:00 a.m. to 4:30 p.m. Monday - Friday. Please note that voicemails left after 4:00 p.m. may not be returned until the following business day.  We are closed weekends and major holidays. You have access to a nurse at all times for urgent questions. Please call the main number to the clinic [phone] and follow the prompts.  For any non-urgent questions, you may also contact your provider using MyChart. We now offer e-Visits for anyone 76 and older to request care online for non-urgent symptoms. For details visit [email or web address].   Also download the MyChart app! Go to the app store, search "MyChart", open the app, select [facility], and log in with your MyChart username and password.  Due to Covid, a mask is required upon entering the hospital/clinic. If you do not have a mask, one will be given to you upon arrival. For doctor visits, patients may have 1 support person aged 90 or older with them. For treatment visits, patients cannot have anyone with them due to current Covid guidelines and our immunocompromised population.   Paclitaxel injection What is this medication? PACLITAXEL (PAK li TAX el) is a chemotherapy drug. It targets fast dividing cells, like cancer cells, and causes these cells to die. This medicine is used to treat ovarian cancer, breast cancer, lung cancer, Kaposi's sarcoma, and other cancers. This medicine may be used for other  purposes; ask your health care provider or pharmacist if you have questions. COMMON BRAND NAME(S): Onxol, Taxol What should I tell my care team before I take this medication? They need to know if you have any of these conditions: history of irregular heartbeat liver  disease low blood counts, like low white cell, platelet, or red cell counts lung or breathing disease, like asthma tingling of the fingers or toes, or other nerve disorder an unusual or allergic reaction to paclitaxel, alcohol, polyoxyethylated castor oil, other chemotherapy, other medicines, foods, dyes, or preservatives pregnant or trying to get pregnant breast-feeding How should I use this medication? This drug is given as an infusion into a vein. It is administered in a hospital or clinic by a specially trained health care professional. Talk to your pediatrician regarding the use of this medicine in children. Special care may be needed. Overdosage: If you think you have taken too much of this medicine contact a poison control center or emergency room at once. NOTE: This medicine is only for you. Do not share this medicine with others. What if I miss a dose? It is important not to miss your dose. Call your doctor or health care professional if you are unable to keep an appointment. What may interact with this medication? Do not take this medicine with any of the following medications: live virus vaccines This medicine may also interact with the following medications: antiviral medicines for hepatitis, HIV or AIDS certain antibiotics like erythromycin and clarithromycin certain medicines for fungal infections like ketoconazole and itraconazole certain medicines for seizures like carbamazepine, phenobarbital, phenytoin gemfibrozil nefazodone rifampin St. John's wort This list may not describe all possible interactions. Give your health care provider a list of all the medicines, herbs, non-prescription drugs, or dietary supplements you use. Also tell them if you smoke, drink alcohol, or use illegal drugs. Some items may interact with your medicine. What should I watch for while using this medication? Your condition will be monitored carefully while you are receiving this medicine. You  will need important blood work done while you are taking this medicine. This medicine can cause serious allergic reactions. To reduce your risk you will need to take other medicine(s) before treatment with this medicine. If you experience allergic reactions like skin rash, itching or hives, swelling of the face, lips, or tongue, tell your doctor or health care professional right away. In some cases, you may be given additional medicines to help with side effects. Follow all directions for their use. This drug may make you feel generally unwell. This is not uncommon, as chemotherapy can affect healthy cells as well as cancer cells. Report any side effects. Continue your course of treatment even though you feel ill unless your doctor tells you to stop. Call your doctor or health care professional for advice if you get a fever, chills or sore throat, or other symptoms of a cold or flu. Do not treat yourself. This drug decreases your body's ability to fight infections. Try to avoid being around people who are sick. This medicine may increase your risk to bruise or bleed. Call your doctor or health care professional if you notice any unusual bleeding. Be careful brushing and flossing your teeth or using a toothpick because you may get an infection or bleed more easily. If you have any dental work done, tell your dentist you are receiving this medicine. Avoid taking products that contain aspirin, acetaminophen, ibuprofen, naproxen, or ketoprofen unless instructed by your doctor. These medicines may  hide a fever. Do not become pregnant while taking this medicine. Women should inform their doctor if they wish to become pregnant or think they might be pregnant. There is a potential for serious side effects to an unborn child. Talk to your health care professional or pharmacist for more information. Do not breast-feed an infant while taking this medicine. Men are advised not to father a child while receiving this  medicine. This product may contain alcohol. Ask your pharmacist or healthcare provider if this medicine contains alcohol. Be sure to tell all healthcare providers you are taking this medicine. Certain medicines, like metronidazole and disulfiram, can cause an unpleasant reaction when taken with alcohol. The reaction includes flushing, headache, nausea, vomiting, sweating, and increased thirst. The reaction can last from 30 minutes to several hours. What side effects may I notice from receiving this medication? Side effects that you should report to your doctor or health care professional as soon as possible: allergic reactions like skin rash, itching or hives, swelling of the face, lips, or tongue breathing problems changes in vision fast, irregular heartbeat high or low blood pressure mouth sores pain, tingling, numbness in the hands or feet signs of decreased platelets or bleeding - bruising, pinpoint red spots on the skin, black, tarry stools, blood in the urine signs of decreased red blood cells - unusually weak or tired, feeling faint or lightheaded, falls signs of infection - fever or chills, cough, sore throat, pain or difficulty passing urine signs and symptoms of liver injury like dark yellow or brown urine; general ill feeling or flu-like symptoms; light-colored stools; loss of appetite; nausea; right upper belly pain; unusually weak or tired; yellowing of the eyes or skin swelling of the ankles, feet, hands unusually slow heartbeat Side effects that usually do not require medical attention (report to your doctor or health care professional if they continue or are bothersome): diarrhea hair loss loss of appetite muscle or joint pain nausea, vomiting pain, redness, or irritation at site where injected tiredness This list may not describe all possible side effects. Call your doctor for medical advice about side effects. You may report side effects to FDA at 1-800-FDA-1088. Where  should I keep my medication? This drug is given in a hospital or clinic and will not be stored at home. NOTE: This sheet is a summary. It may not cover all possible information. If you have questions about this medicine, talk to your doctor, pharmacist, or health care provider.  2023 Elsevier/Gold Standard (2021-11-06 00:00:00) Pembrolizumab injection What is this medication? PEMBROLIZUMAB (pem broe liz ue mab) is a monoclonal antibody. It is used to treat certain types of cancer. This medicine may be used for other purposes; ask your health care provider or pharmacist if you have questions. COMMON BRAND NAME(S): Keytruda What should I tell my care team before I take this medication? They need to know if you have any of these conditions: autoimmune diseases like Crohn's disease, ulcerative colitis, or lupus have had or planning to have an allogeneic stem cell transplant (uses someone else's stem cells) history of organ transplant history of chest radiation nervous system problems like myasthenia gravis or Guillain-Barre syndrome an unusual or allergic reaction to pembrolizumab, other medicines, foods, dyes, or preservatives pregnant or trying to get pregnant breast-feeding How should I use this medication? This medicine is for infusion into a vein. It is given by a health care professional in a hospital or clinic setting. A special MedGuide will be given to you before  each treatment. Be sure to read this information carefully each time. Talk to your pediatrician regarding the use of this medicine in children. While this drug may be prescribed for children as young as 6 months for selected conditions, precautions do apply. Overdosage: If you think you have taken too much of this medicine contact a poison control center or emergency room at once. NOTE: This medicine is only for you. Do not share this medicine with others. What if I miss a dose? It is important not to miss your dose. Call  your doctor or health care professional if you are unable to keep an appointment. What may interact with this medication? Interactions have not been studied. This list may not describe all possible interactions. Give your health care provider a list of all the medicines, herbs, non-prescription drugs, or dietary supplements you use. Also tell them if you smoke, drink alcohol, or use illegal drugs. Some items may interact with your medicine. What should I watch for while using this medication? Your condition will be monitored carefully while you are receiving this medicine. You may need blood work done while you are taking this medicine. Do not become pregnant while taking this medicine or for 4 months after stopping it. Women should inform their doctor if they wish to become pregnant or think they might be pregnant. There is a potential for serious side effects to an unborn child. Talk to your health care professional or pharmacist for more information. Do not breast-feed an infant while taking this medicine or for 4 months after the last dose. What side effects may I notice from receiving this medication? Side effects that you should report to your doctor or health care professional as soon as possible: allergic reactions like skin rash, itching or hives, swelling of the face, lips, or tongue bloody or black, tarry breathing problems changes in vision chest pain chills confusion constipation cough diarrhea dizziness or feeling faint or lightheaded fast or irregular heartbeat fever flushing joint pain low blood counts - this medicine may decrease the number of white blood cells, red blood cells and platelets. You may be at increased risk for infections and bleeding. muscle pain muscle weakness pain, tingling, numbness in the hands or feet persistent headache redness, blistering, peeling or loosening of the skin, including inside the mouth signs and symptoms of high blood sugar such as  dizziness; dry mouth; dry skin; fruity breath; nausea; stomach pain; increased hunger or thirst; increased urination signs and symptoms of kidney injury like trouble passing urine or change in the amount of urine signs and symptoms of liver injury like dark urine, light-colored stools, loss of appetite, nausea, right upper belly pain, yellowing of the eyes or skin sweating swollen lymph nodes weight loss Side effects that usually do not require medical attention (report to your doctor or health care professional if they continue or are bothersome): decreased appetite hair loss tiredness This list may not describe all possible side effects. Call your doctor for medical advice about side effects. You may report side effects to FDA at 1-800-FDA-1088. Where should I keep my medication? This drug is given in a hospital or clinic and will not be stored at home. NOTE: This sheet is a summary. It may not cover all possible information. If you have questions about this medicine, talk to your doctor, pharmacist, or health care provider.  2023 Elsevier/Gold Standard (2021-11-06 00:00:00) Carboplatin injection What is this medication? CARBOPLATIN (KAR boe pla tin) is a chemotherapy drug. It targets  fast dividing cells, like cancer cells, and causes these cells to die. This medicine is used to treat ovarian cancer and many other cancers. This medicine may be used for other purposes; ask your health care provider or pharmacist if you have questions. COMMON BRAND NAME(S): Paraplatin What should I tell my care team before I take this medication? They need to know if you have any of these conditions: blood disorders hearing problems kidney disease recent or ongoing radiation therapy an unusual or allergic reaction to carboplatin, cisplatin, other chemotherapy, other medicines, foods, dyes, or preservatives pregnant or trying to get pregnant breast-feeding How should I use this medication? This drug is  usually given as an infusion into a vein. It is administered in a hospital or clinic by a specially trained health care professional. Talk to your pediatrician regarding the use of this medicine in children. Special care may be needed. Overdosage: If you think you have taken too much of this medicine contact a poison control center or emergency room at once. NOTE: This medicine is only for you. Do not share this medicine with others. What if I miss a dose? It is important not to miss a dose. Call your doctor or health care professional if you are unable to keep an appointment. What may interact with this medication? medicines for seizures medicines to increase blood counts like filgrastim, pegfilgrastim, sargramostim some antibiotics like amikacin, gentamicin, neomycin, streptomycin, tobramycin vaccines Talk to your doctor or health care professional before taking any of these medicines: acetaminophen aspirin ibuprofen ketoprofen naproxen This list may not describe all possible interactions. Give your health care provider a list of all the medicines, herbs, non-prescription drugs, or dietary supplements you use. Also tell them if you smoke, drink alcohol, or use illegal drugs. Some items may interact with your medicine. What should I watch for while using this medication? Your condition will be monitored carefully while you are receiving this medicine. You will need important blood work done while you are taking this medicine. This drug may make you feel generally unwell. This is not uncommon, as chemotherapy can affect healthy cells as well as cancer cells. Report any side effects. Continue your course of treatment even though you feel ill unless your doctor tells you to stop. In some cases, you may be given additional medicines to help with side effects. Follow all directions for their use. Call your doctor or health care professional for advice if you get a fever, chills or sore throat, or  other symptoms of a cold or flu. Do not treat yourself. This drug decreases your body's ability to fight infections. Try to avoid being around people who are sick. This medicine may increase your risk to bruise or bleed. Call your doctor or health care professional if you notice any unusual bleeding. Be careful brushing and flossing your teeth or using a toothpick because you may get an infection or bleed more easily. If you have any dental work done, tell your dentist you are receiving this medicine. Avoid taking products that contain aspirin, acetaminophen, ibuprofen, naproxen, or ketoprofen unless instructed by your doctor. These medicines may hide a fever. Do not become pregnant while taking this medicine. Women should inform their doctor if they wish to become pregnant or think they might be pregnant. There is a potential for serious side effects to an unborn child. Talk to your health care professional or pharmacist for more information. Do not breast-feed an infant while taking this medicine. What side effects may  I notice from receiving this medication? Side effects that you should report to your doctor or health care professional as soon as possible: allergic reactions like skin rash, itching or hives, swelling of the face, lips, or tongue signs of infection - fever or chills, cough, sore throat, pain or difficulty passing urine signs of decreased platelets or bleeding - bruising, pinpoint red spots on the skin, black, tarry stools, nosebleeds signs of decreased red blood cells - unusually weak or tired, fainting spells, lightheadedness breathing problems changes in hearing changes in vision chest pain high blood pressure low blood counts - This drug may decrease the number of white blood cells, red blood cells and platelets. You may be at increased risk for infections and bleeding. nausea and vomiting pain, swelling, redness or irritation at the injection site pain, tingling, numbness in  the hands or feet problems with balance, talking, walking trouble passing urine or change in the amount of urine Side effects that usually do not require medical attention (report to your doctor or health care professional if they continue or are bothersome): hair loss loss of appetite metallic taste in the mouth or changes in taste This list may not describe all possible side effects. Call your doctor for medical advice about side effects. You may report side effects to FDA at 1-800-FDA-1088. Where should I keep my medication? This drug is given in a hospital or clinic and will not be stored at home. NOTE: This sheet is a summary. It may not cover all possible information. If you have questions about this medicine, talk to your doctor, pharmacist, or health care provider.  2023 Elsevier/Gold Standard (2008-05-15 00:00:00)

## 2022-05-27 NOTE — Telephone Encounter (Signed)
Left message with follow-up appointment per 6/8 secure chat.

## 2022-05-27 NOTE — Progress Notes (Signed)
Patient took 20 mg of Pepcid at home prior to coming in today. Will hold 20 mg Pepcid IV as pre med per Dr. Rogue Bussing.

## 2022-05-28 ENCOUNTER — Telehealth: Payer: Self-pay

## 2022-05-28 NOTE — Telephone Encounter (Signed)
Telephone call to patient for follow up after receiving first infusion.   No answer but left message stating we were calling to check on them.  Encouraged patient to call for any questions or concerns.   

## 2022-05-29 ENCOUNTER — Inpatient Hospital Stay: Payer: Medicare Other

## 2022-05-31 IMAGING — MG MM DIGITAL SCREENING BILAT W/ TOMO AND CAD
6 of 12 series · 6 of 36 positions shown · non-contrast
Comparison: Previous exam(s).

CLINICAL DATA: Screening. History of treated right breast cancer,
status post breast conservation therapy.

EXAM:
DIGITAL SCREENING BILATERAL MAMMOGRAM WITH TOMOSYNTHESIS AND CAD
TECHNIQUE: Bilateral screening digital craniocaudal and mediolateral oblique
mammograms were obtained. Bilateral screening digital breast
tomosynthesis was performed. The images were evaluated with
computer-aided detection.

[R MLO synth-2D]
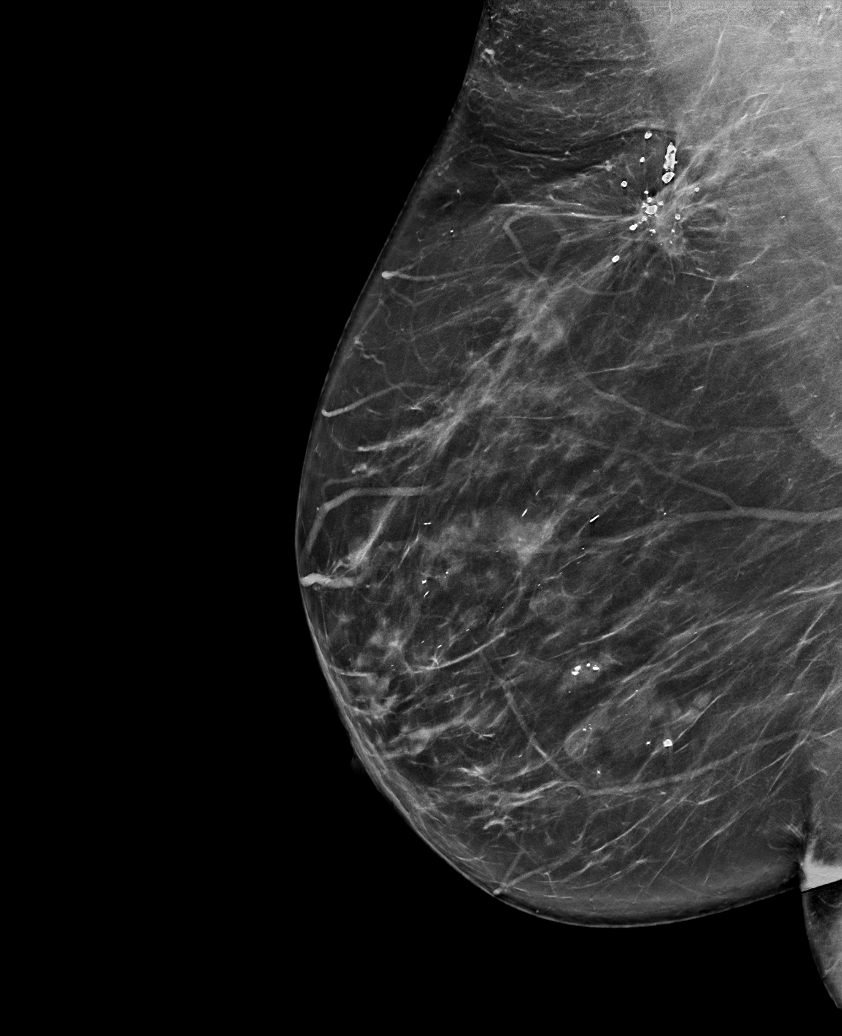

[L MLO synth-2D]
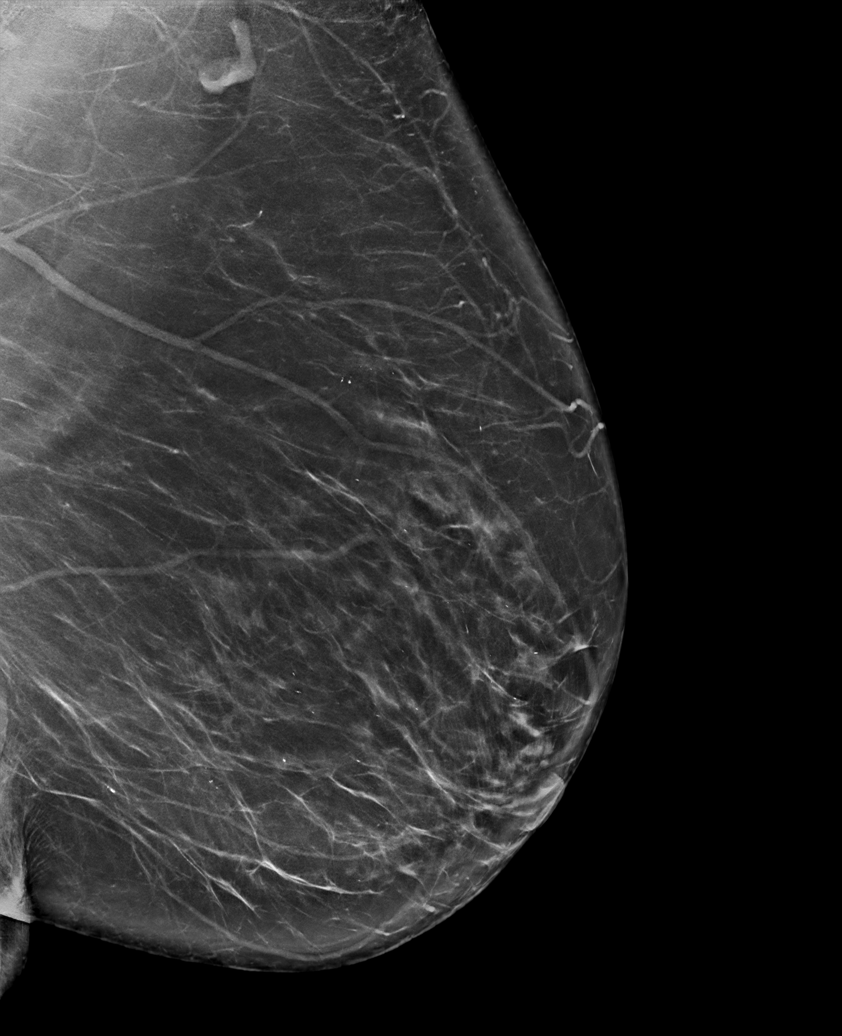

[L CV synth-2D]
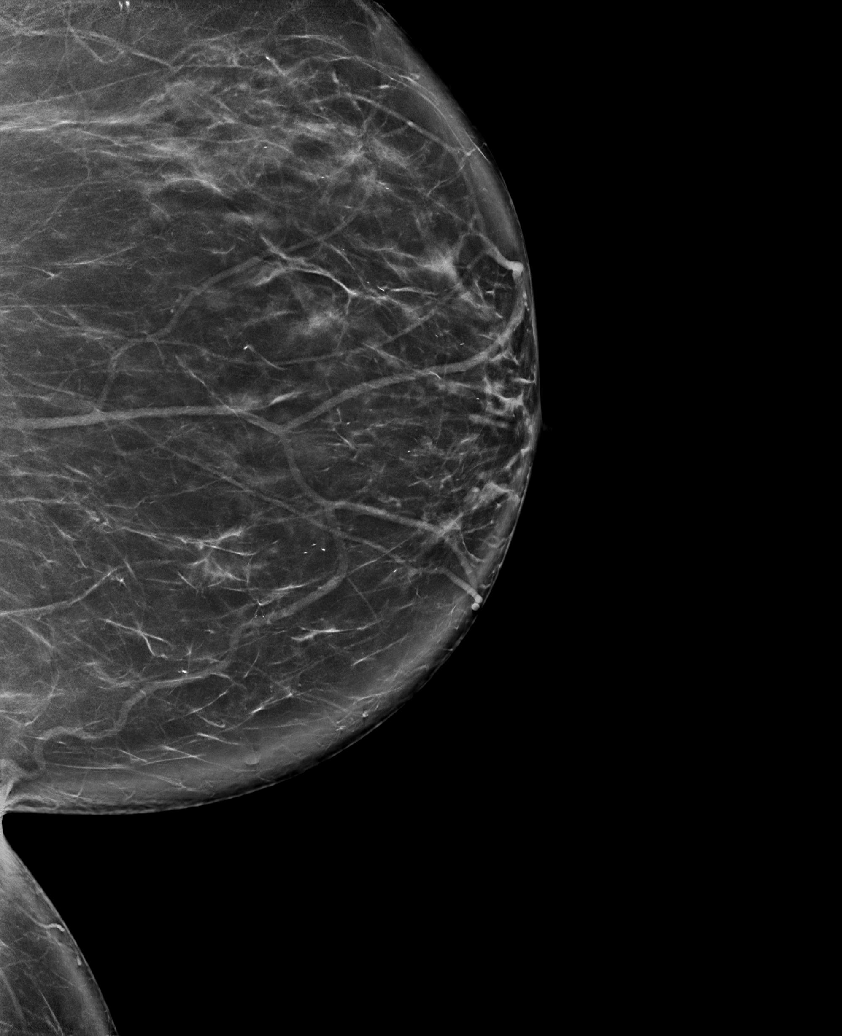

[R XCCL synth-2D]
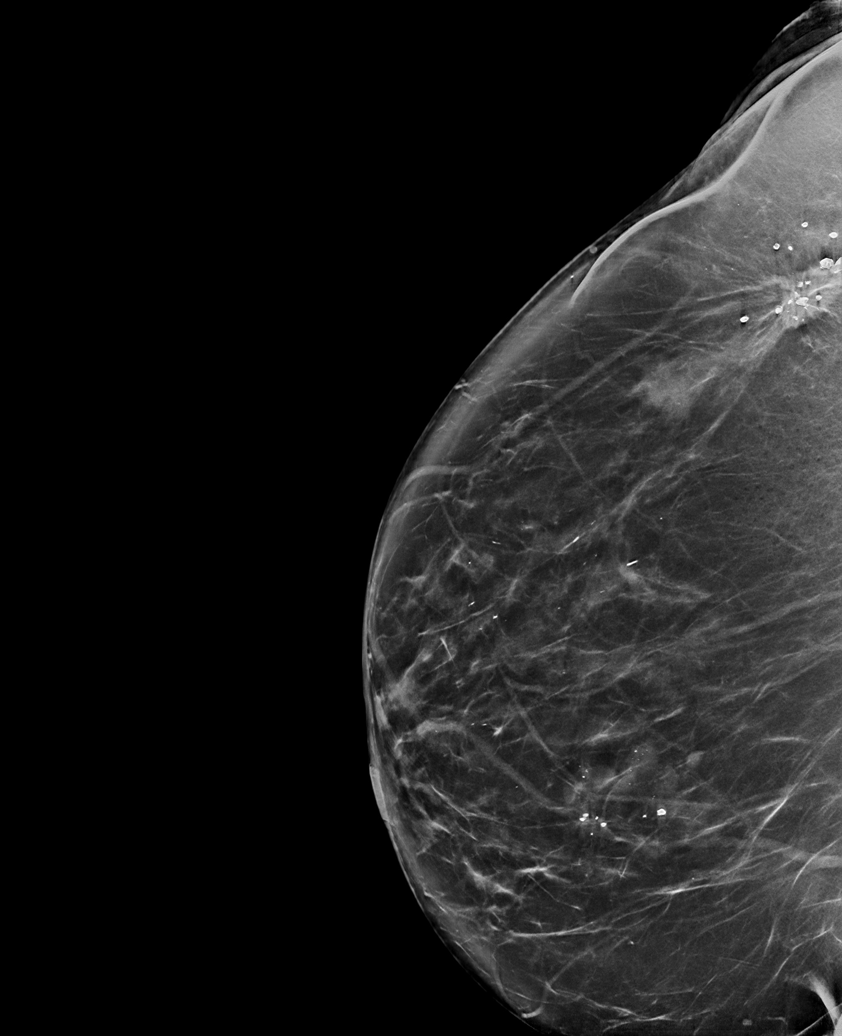

[L CC synth-2D]
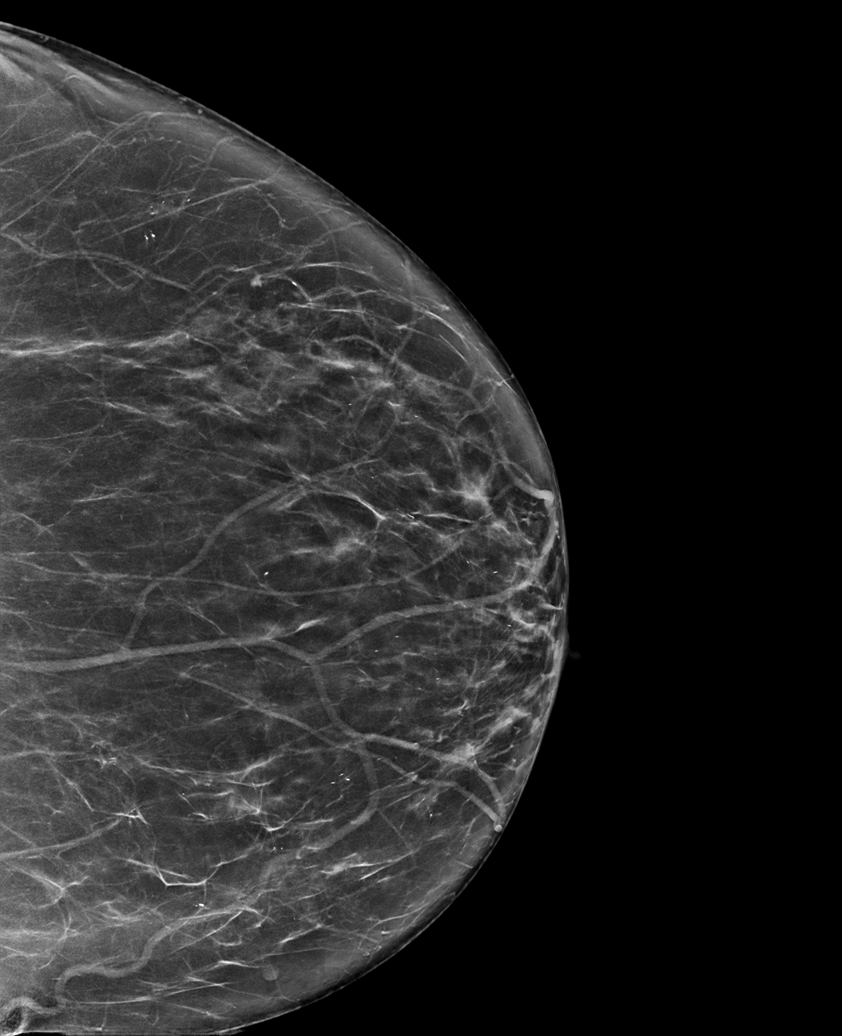

[R CC synth-2D]
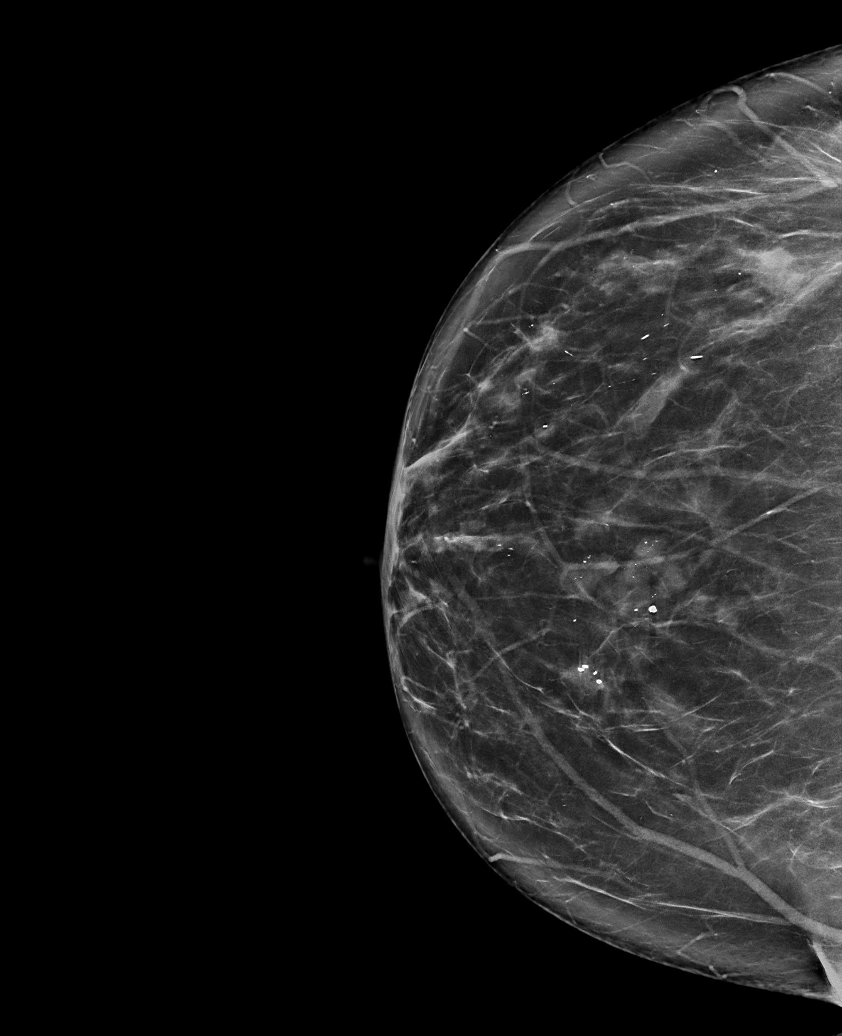

[6 of 36 positions shown; findings below may reference images not displayed]

ACR Breast Density Category b: There are scattered areas of
fibroglandular density.
FINDINGS: In the right breast, possible asymmetries warrant further
evaluation. In the left breast, no findings suspicious for
malignancy.
IMPRESSION: Further evaluation is suggested for possible asymmetries in the
right breast.

RECOMMENDATION:
Diagnostic mammogram and possibly ultrasound of the right breast.
(Code:1D-[id number])

The patient will be contacted regarding the findings, and additional
imaging will be scheduled.

BI-RADS CATEGORY  0: Incomplete. Need additional imaging evaluation
and/or prior mammograms for comparison.

## 2022-06-03 ENCOUNTER — Inpatient Hospital Stay: Payer: Medicare Other

## 2022-06-03 ENCOUNTER — Other Ambulatory Visit: Payer: Self-pay

## 2022-06-03 VITALS — BP 131/70 | HR 57 | Temp 97.6°F | Wt 195.3 lb

## 2022-06-03 DIAGNOSIS — Z5111 Encounter for antineoplastic chemotherapy: Secondary | ICD-10-CM | POA: Diagnosis not present

## 2022-06-03 DIAGNOSIS — Z171 Estrogen receptor negative status [ER-]: Secondary | ICD-10-CM

## 2022-06-03 DIAGNOSIS — Z5112 Encounter for antineoplastic immunotherapy: Secondary | ICD-10-CM | POA: Diagnosis not present

## 2022-06-03 LAB — COMPREHENSIVE METABOLIC PANEL
ALT: 15 U/L (ref 0–44)
AST: 18 U/L (ref 15–41)
Albumin: 3.4 g/dL — ABNORMAL LOW (ref 3.5–5.0)
Alkaline Phosphatase: 70 U/L (ref 38–126)
Anion gap: 8 (ref 5–15)
BUN: 15 mg/dL (ref 8–23)
CO2: 29 mmol/L (ref 22–32)
Calcium: 8.9 mg/dL (ref 8.9–10.3)
Chloride: 101 mmol/L (ref 98–111)
Creatinine, Ser: 0.83 mg/dL (ref 0.44–1.00)
GFR, Estimated: >60 mL/min (ref >60)
Glucose, Bld: 117 mg/dL — ABNORMAL HIGH (ref 70–99)
Potassium: 3.3 mmol/L — ABNORMAL LOW (ref 3.5–5.1)
Sodium: 138 mmol/L (ref 135–145)
Total Bilirubin: 0.7 mg/dL (ref 0.3–1.2)
Total Protein: 6.9 g/dL (ref 6.5–8.1)

## 2022-06-03 LAB — CBC WITH DIFFERENTIAL/PLATELET
Abs Immature Granulocytes: 0.04 10*3/uL (ref 0.00–0.07)
Basophils Absolute: 0 10*3/uL (ref 0.0–0.1)
Basophils Relative: 1 %
Eosinophils Absolute: 0.1 10*3/uL (ref 0.0–0.5)
Eosinophils Relative: 3 %
HCT: 34.8 % — ABNORMAL LOW (ref 36.0–46.0)
Hemoglobin: 10.8 g/dL — ABNORMAL LOW (ref 12.0–15.0)
Immature Granulocytes: 1 %
Lymphocytes Relative: 33 %
Lymphs Abs: 1.3 10*3/uL (ref 0.7–4.0)
MCH: 27.3 pg (ref 26.0–34.0)
MCHC: 31 g/dL (ref 30.0–36.0)
MCV: 87.9 fL (ref 80.0–100.0)
Monocytes Absolute: 0.1 10*3/uL (ref 0.1–1.0)
Monocytes Relative: 3 %
Neutro Abs: 2.4 10*3/uL (ref 1.7–7.7)
Neutrophils Relative %: 59 %
Platelets: 260 10*3/uL (ref 150–400)
RBC: 3.96 MIL/uL (ref 3.87–5.11)
RDW: 12.9 % (ref 11.5–15.5)
WBC: 4.1 10*3/uL (ref 4.0–10.5)
nRBC: 0 % (ref 0.0–0.2)

## 2022-06-03 MED ORDER — HEPARIN SOD (PORK) LOCK FLUSH 100 UNIT/ML IV SOLN
INTRAVENOUS | Status: AC
  Filled 2022-06-03: qty 5

## 2022-06-03 MED ORDER — SODIUM CHLORIDE 0.9 % IV SOLN
80.0000 mg/m2 | Freq: Once | INTRAVENOUS | Status: AC
  Administered 2022-06-03: 162 mg via INTRAVENOUS
  Filled 2022-06-03: qty 27

## 2022-06-03 MED ORDER — SODIUM CHLORIDE 0.9 % IV SOLN
10.0000 mg | Freq: Once | INTRAVENOUS | Status: AC
  Administered 2022-06-03: 10 mg via INTRAVENOUS
  Filled 2022-06-03: qty 10

## 2022-06-03 MED ORDER — HEPARIN SOD (PORK) LOCK FLUSH 100 UNIT/ML IV SOLN
500.0000 [IU] | Freq: Once | INTRAVENOUS | Status: AC | PRN
  Filled 2022-06-03: qty 5

## 2022-06-03 MED ORDER — DIPHENHYDRAMINE HCL 50 MG/ML IJ SOLN
50.0000 mg | Freq: Once | INTRAMUSCULAR | Status: AC
  Administered 2022-06-03: 50 mg via INTRAVENOUS
  Filled 2022-06-03: qty 1

## 2022-06-03 MED ORDER — SODIUM CHLORIDE 0.9 % IV SOLN
Freq: Once | INTRAVENOUS | Status: AC
  Filled 2022-06-03: qty 250

## 2022-06-03 NOTE — Patient Instructions (Signed)
[facility]  Discharge Instructions: Thank you for choosing [facility] to provide your oncology and hematology care.  If you have a lab appointment with the Cancer Center, please go directly to the Cancer Center and check in at the registration area.  Wear comfortable clothing and clothing appropriate for easy access to any Portacath or PICC line.   We strive to give you quality time with your provider. You may need to reschedule your appointment if you arrive late (15 or more minutes).  Arriving late affects you and other patients whose appointments are after yours.  Also, if you miss three or more appointments without notifying the office, you may be dismissed from the clinic at the provider's discretion.      For prescription refill requests, have your pharmacy contact our office and allow 72 hours for refills to be completed.    Today you received the following chemotherapy and/or immunotherapy agents Taxol       To help prevent nausea and vomiting after your treatment, we encourage you to take your nausea medication as directed.  BELOW ARE SYMPTOMS THAT SHOULD BE REPORTED IMMEDIATELY: *FEVER GREATER THAN 100.4 F (38 C) OR HIGHER *CHILLS OR SWEATING *NAUSEA AND VOMITING THAT IS NOT CONTROLLED WITH YOUR NAUSEA MEDICATION *UNUSUAL SHORTNESS OF BREATH *UNUSUAL BRUISING OR BLEEDING *URINARY PROBLEMS (pain or burning when urinating, or frequent urination) *BOWEL PROBLEMS (unusual diarrhea, constipation, pain near the anus) TENDERNESS IN MOUTH AND THROAT WITH OR WITHOUT PRESENCE OF ULCERS (sore throat, sores in mouth, or a toothache) UNUSUAL RASH, SWELLING OR PAIN  UNUSUAL VAGINAL DISCHARGE OR ITCHING   Items with * indicate a potential emergency and should be followed up as soon as possible or go to the Emergency Department if any problems should occur.  Please show the CHEMOTHERAPY ALERT CARD or IMMUNOTHERAPY ALERT CARD at check-in to the  Emergency Department and triage nurse.  Should you have questions after your visit or need to cancel or reschedule your appointment, please contact [facility]  [phone] and follow the prompts.  Office hours are 8:00 a.m. to 4:30 p.m. Monday - Friday. Please note that voicemails left after 4:00 p.m. may not be returned until the following business day.  We are closed weekends and major holidays. You have access to a nurse at all times for urgent questions. Please call the main number to the clinic [phone] and follow the prompts.  For any non-urgent questions, you may also contact your provider using MyChart. We now offer e-Visits for anyone 18 and older to request care online for non-urgent symptoms. For details visit [email or web address].   Also download the MyChart app! Go to the app store, search "MyChart", open the app, select [facility], and log in with your MyChart username and password.  Masks are optional in the cancer centers. If you would like for your care team to wear a mask while they are taking care of you, please let them know. For doctor visits, patients may have with them one support person who is at least 71 years old. At this time, visitors are not allowed in the infusion area.   

## 2022-06-03 NOTE — Progress Notes (Signed)
Per pt she took Pepcid 40 mg PO at home before appointment. Per Dr. Rogue Bussing to hold Pepcid 20 mg IVPB prior to treatment. Pt updated.  Baleigh Rennaker [employer]

## 2022-06-08 ENCOUNTER — Telehealth: Payer: Self-pay | Admitting: *Deleted

## 2022-06-08 NOTE — Telephone Encounter (Signed)
Patient called stating that she is having sinus issues for which she take medicine at night, but she needs something she can take during the day due to pressure and pain in her ears an sinus drainage. Sh estates that she has taken Zyrtec in the past but unsure if she is ok to take this with her treatment or not. Please advise

## 2022-06-08 NOTE — Telephone Encounter (Signed)
Pt.notified

## 2022-06-09 ENCOUNTER — Inpatient Hospital Stay: Payer: Medicare Other | Admitting: Occupational Therapy

## 2022-06-09 DIAGNOSIS — Z9189 Other specified personal risk factors, not elsewhere classified: Secondary | ICD-10-CM

## 2022-06-09 MED FILL — Dexamethasone Sodium Phosphate Inj 100 MG/10ML: INTRAMUSCULAR | Qty: 1 | Status: AC

## 2022-06-09 NOTE — Therapy (Signed)
[facility] [address], [address] [place], [place], [address] Phone: [phone]   Fax:  [phone]  Occupational Therapy Screen:  Patient Details  Name: Morgan Peterson MRN: [id number] Date of Birth: 03/17/1951 No data recorded  Encounter Date: 06/09/2022   OT End of Session - 06/09/22 1714     Visit Number 0             Past Medical History:  Diagnosis Date   Cervical radiculopathy    Hiatal hernia    Hypercholesterolemia    Hyperlipidemia    Hypertension    IBS (irritable bowel syndrome)    Malignant neoplasm of upper-outer quadrant of female breast (HCC) 04/11/2009   Right breast, invasive ductal carcinoma, 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 Arimidex therapy completed September 2015   Mild mitral valve prolapse    Personal history of malignant neoplasm of breast 2010   Personal history of radiation therapy 2010   mammosite   T2DM (type 2 diabetes mellitus) (HCC) 2011    Past Surgical History:  Procedure Laterality Date   ABDOMINAL EXPLORATION SURGERY  2000   ovarian cyst   BREAST BIOPSY Right 2010   +    BREAST EXCISIONAL BIOPSY Right 2010   + mammo site inasive mammo ca   BREAST LUMPECTOMY Right 2010   [facility]   COLONOSCOPY  07/19/2012   Normal exam, Dr. [name]   COLONOSCOPY WITH PROPOFOL N/A 04/21/2022   Procedure: COLONOSCOPY WITH PROPOFOL;  Surgeon: [name], Jeffrey W, MD;  Location: [facility] ENDOSCOPY;  Service: Endoscopy;  Laterality: N/A;   MASTECTOMY Right 2010   partial/ lumpectomy   PORTACATH PLACEMENT Left 05/19/2022   Procedure: INSERTION PORT-A-CATH;  Surgeon: [name], Jeffrey W, MD;  Location: [facility] ORS;  Service: General;  Laterality: Left;   SIMPLE MASTECTOMY WITH AXILLARY SENTINEL NODE BIOPSY Right 04/26/2022   Procedure: SIMPLE MASTECTOMY WITH AXILLARY SENTINEL NODE BIOPSY;  Surgeon: [name], Jeffrey W, MD;  Location: [facility] ORS;  Service:  General;  Laterality: Right;  RNFA to assist   TUBAL LIGATION  20 years ago    There were no vitals filed for this visit.   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  Still mostly on the computer and I do not really do a lot outside of a lot of allergies.    Currently in Pain? No/denies                 LYMPHEDEMA/ONCOLOGY QUESTIONNAIRE - 06/09/22 0001       Right Upper Extremity Lymphedema   15 cm Proximal to Olecranon Process 35.5 cm    10 cm Proximal to Olecranon Process 32.4 cm    Olecranon Process 26 cm    15 cm Proximal to Ulnar Styloid Process 25 cm    Just Proximal to Ulnar Styloid Process 17 cm      Left Upper Extremity Lymphedema   15 cm Proximal to Olecranon Process 35 cm    10 cm Proximal to Olecranon Process 32.4 cm    Olecranon Process 25.5 cm    10 cm Proximal to Ulnar Styloid Process 23.5 cm               ASSESSMENT & PLAN Dr Brahmanday:    Carcinoma of upper-outer quadrant of right breast   in female, estrogen receptor negative ([place]) #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 ]- s/p mastectomy.  No role for radiation.   #I reviewed the staging pathology with the patient in detail. Given the triple negative nature of the disease; grade 3; lymph node present-I think it is reasonable to consider adjuvant chemo-immunotherapy [KEYNOTE ].  Discussed that goal of treatment is cure.    # I reviewed at length the individual components with chemotherapy; and the schedule in detail.  I also discussed the potential side effects including but not limited to-increasing fatigue, nausea vomiting, diarrhea, hair loss, sores in the mouth, increase risk of infection and also neuropathy.  Also reviewed the multiple strategies to avoid/mitigate similar side effects including preemptive medications-for nausea vomiting. Discussed the potential side effects of  immunotherapy including but not limited to diarrhea; skin rash; elevated LFTs/endocrine abnormalities etc. however the risk of serious side effects less than 10%.    # Labs today reviewed;  acceptable for treatment tomorrow.  TSH-pending. June 6th- ECHO- 05/25/2022- 65-70%.    #Genetic counseling: Family history of breast cancer; no prior history of breast cancer.    # DISPOSITION: #  6/08- carbo-taxol-keytruda # D-8 labs- cbc/cmp; taxol # D-15-MD;  labs-cbc/cmp;taxol # Zarx[facility]- 6/23 & 6/24 & 6/25.  # Follow up MD- 6/27/Tuesday-;labs- cbc/cmp MD carbo-taxol-keytruda- Dr.B       OT SCREEN 06/09/22: Patient right breast cancer patient.  Had a right mastectomy on 04/26/2022.  Patient report doing really well with her range of motion and functional use of bilateral upper extremities.  Patient is continuing with chemo but do not need radiation.  Patient was referred to OT for education for lymphedema as well as motion. Patient risk for lymphedema is on the lower side but was educated in signs and symptoms as well as precautions.  Handout was reviewed and provided that was in the cancer journal.  Circumference was taken for bilateral upper extremity-patient is right hand dominant.  Right upper extremity increased 0.5 but appropriate for dominance it. No need for follow-up patient to contact me if needed.                                 Visit Diagnosis: At risk for lymphedema    Problem List Patient Active Problem List   Diagnosis Date Noted   Carcinoma of upper-outer quadrant of right breast in female, estrogen receptor negative ([place]) 05/26/2022   Breast cancer ([place]) 05/10/2022   Goals of care, counseling/discussion 05/10/2022   Encounter for monitoring cardiotoxic drug therapy 05/10/2022   Carotid stenosis 04/21/2022   Hypertension associated with type 2 diabetes mellitus ([place]) 01/20/2021   Chronic low back pain without sciatica 08/18/2017   Irritable bowel syndrome  with constipation 05/02/2017   Vitamin D deficiency 09/09/2016   Hiatal hernia 06/02/2016   Allergic rhinitis, seasonal 06/02/2016   Obesity (BMI 30-39.9) 04/30/2016   Angio-edema 04/30/2016   Hyperlipidemia 11/04/2015   Hyperlipidemia due to type 2 diabetes mellitus ([place]) 11/04/2015   Cervical radiculopathy at C6 07/31/2015   Neuroma digital nerve 07/31/2015   Type 2 diabetes mellitus with hyperglycemia, with long-term current use of insulin ([place]) 06/30/2015   Cervical disc disease 06/30/2015   Essential hypertension 06/30/2015   History of breast cancer in female 03/23/2009    Rosalyn Gess, OTR/L,CLT 06/09/2022, 5:17 PM  [facility] [place], [place], [place],  [address] Phone: [phone]   Fax:  [phone]  Name: Misako L Augustus MRN: [id number] Date of Birth: 12/17/1951  

## 2022-06-10 ENCOUNTER — Other Ambulatory Visit: Payer: Self-pay | Admitting: Internal Medicine

## 2022-06-10 ENCOUNTER — Inpatient Hospital Stay: Payer: Medicare Other

## 2022-06-10 VITALS — BP 124/54 | HR 66 | Temp 97.8°F | Resp 16 | Wt 198.5 lb

## 2022-06-10 DIAGNOSIS — Z171 Estrogen receptor negative status [ER-]: Secondary | ICD-10-CM

## 2022-06-10 DIAGNOSIS — Z5111 Encounter for antineoplastic chemotherapy: Secondary | ICD-10-CM | POA: Diagnosis not present

## 2022-06-10 DIAGNOSIS — C50411 Malignant neoplasm of upper-outer quadrant of right female breast: Secondary | ICD-10-CM

## 2022-06-10 LAB — CBC WITH DIFFERENTIAL/PLATELET
Abs Immature Granulocytes: 0.05 10*3/uL (ref 0.00–0.07)
Basophils Absolute: 0 10*3/uL (ref 0.0–0.1)
Basophils Relative: 1 %
Eosinophils Absolute: 0 10*3/uL (ref 0.0–0.5)
Eosinophils Relative: 1 %
HCT: 34 % — ABNORMAL LOW (ref 36.0–46.0)
Hemoglobin: 10.3 g/dL — ABNORMAL LOW (ref 12.0–15.0)
Immature Granulocytes: 2 %
Lymphocytes Relative: 56 %
Lymphs Abs: 1.9 10*3/uL (ref 0.7–4.0)
MCH: 26.9 pg (ref 26.0–34.0)
MCHC: 30.3 g/dL (ref 30.0–36.0)
MCV: 88.8 fL (ref 80.0–100.0)
Monocytes Absolute: 0.3 10*3/uL (ref 0.1–1.0)
Monocytes Relative: 9 %
Neutro Abs: 1 10*3/uL — ABNORMAL LOW (ref 1.7–7.7)
Neutrophils Relative %: 31 %
Platelets: 217 10*3/uL (ref 150–400)
RBC: 3.83 MIL/uL — ABNORMAL LOW (ref 3.87–5.11)
RDW: 13.7 % (ref 11.5–15.5)
WBC: 3.3 10*3/uL — ABNORMAL LOW (ref 4.0–10.5)
nRBC: 0.6 % — ABNORMAL HIGH (ref 0.0–0.2)

## 2022-06-10 LAB — COMPREHENSIVE METABOLIC PANEL
ALT: 20 U/L (ref 0–44)
AST: 19 U/L (ref 15–41)
Albumin: 3.5 g/dL (ref 3.5–5.0)
Alkaline Phosphatase: 72 U/L (ref 38–126)
Anion gap: 7 (ref 5–15)
BUN: 12 mg/dL (ref 8–23)
CO2: 28 mmol/L (ref 22–32)
Calcium: 8.5 mg/dL — ABNORMAL LOW (ref 8.9–10.3)
Chloride: 105 mmol/L (ref 98–111)
Creatinine, Ser: 0.83 mg/dL (ref 0.44–1.00)
GFR, Estimated: >60 mL/min (ref >60)
Glucose, Bld: 117 mg/dL — ABNORMAL HIGH (ref 70–99)
Potassium: 3.5 mmol/L (ref 3.5–5.1)
Sodium: 140 mmol/L (ref 135–145)
Total Bilirubin: 0.4 mg/dL (ref 0.3–1.2)
Total Protein: 6.6 g/dL (ref 6.5–8.1)

## 2022-06-10 MED ORDER — HEPARIN SOD (PORK) LOCK FLUSH 100 UNIT/ML IV SOLN
INTRAVENOUS | Status: AC
  Administered 2022-06-10: 500 [IU]
  Filled 2022-06-10: qty 5

## 2022-06-10 MED ORDER — SODIUM CHLORIDE 0.9 % IV SOLN
10.0000 mg | Freq: Once | INTRAVENOUS | Status: AC
  Administered 2022-06-10: 10 mg via INTRAVENOUS
  Filled 2022-06-10: qty 10

## 2022-06-10 MED ORDER — DIPHENHYDRAMINE HCL 50 MG/ML IJ SOLN
50.0000 mg | Freq: Once | INTRAMUSCULAR | Status: AC
  Administered 2022-06-10: 50 mg via INTRAVENOUS
  Filled 2022-06-10: qty 1

## 2022-06-10 MED ORDER — HEPARIN SOD (PORK) LOCK FLUSH 100 UNIT/ML IV SOLN
500.0000 [IU] | Freq: Once | INTRAVENOUS | Status: AC | PRN
  Filled 2022-06-10: qty 5

## 2022-06-10 MED ORDER — SODIUM CHLORIDE 0.9 % IV SOLN
Freq: Once | INTRAVENOUS | Status: AC
  Filled 2022-06-10: qty 250

## 2022-06-10 MED ORDER — SODIUM CHLORIDE 0.9 % IV SOLN
80.0000 mg/m2 | Freq: Once | INTRAVENOUS | Status: AC
  Administered 2022-06-10: 162 mg via INTRAVENOUS
  Filled 2022-06-10: qty 27

## 2022-06-10 MED ORDER — FAMOTIDINE IN NACL 20-0.9 MG/50ML-% IV SOLN
20.0000 mg | Freq: Once | INTRAVENOUS | Status: AC
  Administered 2022-06-10: 20 mg via INTRAVENOUS
  Filled 2022-06-10: qty 50

## 2022-06-10 NOTE — Progress Notes (Signed)
Nutrition Assessment:  Receiving chemotherapy.  71 year old female with stage II triple negative breast cancer, s/p mastectomy.   Past medical history of HLD, DM.  Patient receiving chemotherapy.   Met with patient during infusion.  Reports good appetite, "too good."  Says that she had blood work from PCP that showed she had allergy to many foods.  Says that she was seen by allergist that did patch testing that did not show any allergies.  She has been avoiding foods because unsure as to what she is allergic too. Denies nutrition impact symptoms   Medications: vit D,pepcid, ozempic, vit B 12, MVI, zofran, compazine  Labs: reviewed  Anthropometrics:   Height: 66 inches Weight: 198 lb 8.4 oz Stable wt BMI: 31  NUTRITION DIAGNOSIS: Food and nutrition related knowledge deficit related to cancer diagnosis as evidenced by education provided regarding nutrition during treatment   INTERVENTION:  Discussed nutrition during treatment Recommend patient follow-up with allergist and clarify if she should be avoiding certain foods due to allergies. Encouraged plant based diet. Contact information provided     MONITORING, EVALUATION, GOAL: weight trends, intake   NEXT VISIT: to be determined with treatment  Kristyne Woodring B. Zenia Resides, [place], [place] Registered Dietitian [phone]

## 2022-06-10 NOTE — Progress Notes (Signed)
Per MD, ok to treat with [place] 1

## 2022-06-10 NOTE — Progress Notes (Signed)
Per secure chat with Dr. Rogue Bussing at 1003am, ok to proceed with treatment with ANC of 1.0. Pt aware of injection appts at [facility] and [facility] on Friday and Saturday respectively. Pt verbalized understanding and reports she will be at both appts.

## 2022-06-10 NOTE — Patient Instructions (Signed)
[facility]  Discharge Instructions: Thank you for choosing [facility] to provide your oncology and hematology care.  If you have a lab appointment with the Cancer Center, please go directly to the Cancer Center and check in at the registration area.  Wear comfortable clothing and clothing appropriate for easy access to any Portacath or PICC line.   We strive to give you quality time with your provider. You may need to reschedule your appointment if you arrive late (15 or more minutes).  Arriving late affects you and other patients whose appointments are after yours.  Also, if you miss three or more appointments without notifying the office, you may be dismissed from the clinic at the provider's discretion.      For prescription refill requests, have your pharmacy contact our office and allow 72 hours for refills to be completed.    Today you received the following chemotherapy and/or immunotherapy agents Taxol       To help prevent nausea and vomiting after your treatment, we encourage you to take your nausea medication as directed.  BELOW ARE SYMPTOMS THAT SHOULD BE REPORTED IMMEDIATELY: *FEVER GREATER THAN 100.4 F (38 C) OR HIGHER *CHILLS OR SWEATING *NAUSEA AND VOMITING THAT IS NOT CONTROLLED WITH YOUR NAUSEA MEDICATION *UNUSUAL SHORTNESS OF BREATH *UNUSUAL BRUISING OR BLEEDING *URINARY PROBLEMS (pain or burning when urinating, or frequent urination) *BOWEL PROBLEMS (unusual diarrhea, constipation, pain near the anus) TENDERNESS IN MOUTH AND THROAT WITH OR WITHOUT PRESENCE OF ULCERS (sore throat, sores in mouth, or a toothache) UNUSUAL RASH, SWELLING OR PAIN  UNUSUAL VAGINAL DISCHARGE OR ITCHING   Items with * indicate a potential emergency and should be followed up as soon as possible or go to the Emergency Department if any problems should occur.  Please show the CHEMOTHERAPY ALERT CARD or IMMUNOTHERAPY ALERT CARD at check-in to the  Emergency Department and triage nurse.  Should you have questions after your visit or need to cancel or reschedule your appointment, please contact [facility]  [phone] and follow the prompts.  Office hours are 8:00 a.m. to 4:30 p.m. Monday - Friday. Please note that voicemails left after 4:00 p.m. may not be returned until the following business day.  We are closed weekends and major holidays. You have access to a nurse at all times for urgent questions. Please call the main number to the clinic [phone] and follow the prompts.  For any non-urgent questions, you may also contact your provider using MyChart. We now offer e-Visits for anyone 18 and older to request care online for non-urgent symptoms. For details visit [email or web address].   Also download the MyChart app! Go to the app store, search "MyChart", open the app, select [facility], and log in with your MyChart username and password.  Masks are optional in the cancer centers. If you would like for your care team to wear a mask while they are taking care of you, please let them know. For doctor visits, patients may have with them one support person who is at least 71 years old. At this time, visitors are not allowed in the infusion area.   

## 2022-06-11 ENCOUNTER — Inpatient Hospital Stay: Payer: Medicare Other

## 2022-06-11 DIAGNOSIS — Z5111 Encounter for antineoplastic chemotherapy: Secondary | ICD-10-CM | POA: Diagnosis not present

## 2022-06-11 DIAGNOSIS — Z171 Estrogen receptor negative status [ER-]: Secondary | ICD-10-CM

## 2022-06-11 MED ORDER — FILGRASTIM-SNDZ 480 MCG/0.8ML IJ SOSY
480.0000 ug | PREFILLED_SYRINGE | Freq: Once | INTRAMUSCULAR | Status: AC
  Administered 2022-06-11: 480 ug via SUBCUTANEOUS
  Filled 2022-06-11: qty 0.8

## 2022-06-12 ENCOUNTER — Other Ambulatory Visit: Payer: Self-pay

## 2022-06-12 ENCOUNTER — Inpatient Hospital Stay: Payer: Medicare Other

## 2022-06-12 VITALS — BP 148/68 | HR 94 | Temp 97.9°F

## 2022-06-12 DIAGNOSIS — Z5111 Encounter for antineoplastic chemotherapy: Secondary | ICD-10-CM | POA: Diagnosis not present

## 2022-06-12 DIAGNOSIS — C50811 Malignant neoplasm of overlapping sites of right female breast: Secondary | ICD-10-CM

## 2022-06-12 MED ORDER — FILGRASTIM-SNDZ 480 MCG/0.8ML IJ SOSY
480.0000 ug | PREFILLED_SYRINGE | Freq: Once | INTRAMUSCULAR | Status: AC
  Administered 2022-06-12: 480 ug via SUBCUTANEOUS
  Filled 2022-06-12: qty 0.8

## 2022-06-14 ENCOUNTER — Inpatient Hospital Stay (HOSPITAL_BASED_OUTPATIENT_CLINIC_OR_DEPARTMENT_OTHER): Payer: Medicare Other | Admitting: Internal Medicine

## 2022-06-14 ENCOUNTER — Encounter: Payer: Self-pay | Admitting: Internal Medicine

## 2022-06-14 DIAGNOSIS — C50411 Malignant neoplasm of upper-outer quadrant of right female breast: Secondary | ICD-10-CM | POA: Diagnosis not present

## 2022-06-14 DIAGNOSIS — Z5111 Encounter for antineoplastic chemotherapy: Secondary | ICD-10-CM | POA: Diagnosis not present

## 2022-06-14 DIAGNOSIS — Z171 Estrogen receptor negative status [ER-]: Secondary | ICD-10-CM

## 2022-06-14 NOTE — Progress Notes (Signed)
Patient has new throat dryness.  Would like to know if she can take liquid multiple vitamin.

## 2022-06-15 ENCOUNTER — Inpatient Hospital Stay: Payer: Medicare Other

## 2022-06-15 ENCOUNTER — Ambulatory Visit: Payer: Medicare Other | Admitting: Internal Medicine

## 2022-06-15 VITALS — BP 126/70 | HR 70 | Temp 98.4°F | Resp 20

## 2022-06-15 DIAGNOSIS — Z171 Estrogen receptor negative status [ER-]: Secondary | ICD-10-CM

## 2022-06-15 DIAGNOSIS — C50811 Malignant neoplasm of overlapping sites of right female breast: Secondary | ICD-10-CM

## 2022-06-15 DIAGNOSIS — Z5111 Encounter for antineoplastic chemotherapy: Secondary | ICD-10-CM | POA: Diagnosis not present

## 2022-06-15 LAB — CBC WITH DIFFERENTIAL/PLATELET
Abs Immature Granulocytes: 0.04 10*3/uL (ref 0.00–0.07)
Basophils Absolute: 0 10*3/uL (ref 0.0–0.1)
Basophils Relative: 1 %
Eosinophils Absolute: 0 10*3/uL (ref 0.0–0.5)
Eosinophils Relative: 0 %
HCT: 34.6 % — ABNORMAL LOW (ref 36.0–46.0)
Hemoglobin: 10.8 g/dL — ABNORMAL LOW (ref 12.0–15.0)
Immature Granulocytes: 1 %
Lymphocytes Relative: 41 %
Lymphs Abs: 1.5 10*3/uL (ref 0.7–4.0)
MCH: 28 pg (ref 26.0–34.0)
MCHC: 31.2 g/dL (ref 30.0–36.0)
MCV: 89.6 fL (ref 80.0–100.0)
Monocytes Absolute: 0.3 10*3/uL (ref 0.1–1.0)
Monocytes Relative: 8 %
Neutro Abs: 1.8 10*3/uL (ref 1.7–7.7)
Neutrophils Relative %: 49 %
Platelets: 223 10*3/uL (ref 150–400)
RBC: 3.86 MIL/uL — ABNORMAL LOW (ref 3.87–5.11)
RDW: 13.9 % (ref 11.5–15.5)
WBC: 3.7 10*3/uL — ABNORMAL LOW (ref 4.0–10.5)
nRBC: 0.5 % — ABNORMAL HIGH (ref 0.0–0.2)

## 2022-06-15 LAB — COMPREHENSIVE METABOLIC PANEL
ALT: 21 U/L (ref 0–44)
AST: 19 U/L (ref 15–41)
Albumin: 3.5 g/dL (ref 3.5–5.0)
Alkaline Phosphatase: 84 U/L (ref 38–126)
Anion gap: 8 (ref 5–15)
BUN: 10 mg/dL (ref 8–23)
CO2: 27 mmol/L (ref 22–32)
Calcium: 8.5 mg/dL — ABNORMAL LOW (ref 8.9–10.3)
Chloride: 102 mmol/L (ref 98–111)
Creatinine, Ser: 0.68 mg/dL (ref 0.44–1.00)
GFR, Estimated: >60 mL/min (ref >60)
Glucose, Bld: 139 mg/dL — ABNORMAL HIGH (ref 70–99)
Potassium: 3.5 mmol/L (ref 3.5–5.1)
Sodium: 137 mmol/L (ref 135–145)
Total Bilirubin: 0.4 mg/dL (ref 0.3–1.2)
Total Protein: 6.8 g/dL (ref 6.5–8.1)

## 2022-06-15 MED ORDER — SODIUM CHLORIDE 0.9 % IV SOLN
200.0000 mg | Freq: Once | INTRAVENOUS | Status: AC
  Administered 2022-06-15: 200 mg via INTRAVENOUS
  Filled 2022-06-15: qty 200

## 2022-06-15 MED ORDER — SODIUM CHLORIDE 0.9 % IV SOLN
150.0000 mg | Freq: Once | INTRAVENOUS | Status: AC
  Administered 2022-06-15: 150 mg via INTRAVENOUS
  Filled 2022-06-15: qty 150

## 2022-06-15 MED ORDER — FAMOTIDINE IN NACL 20-0.9 MG/50ML-% IV SOLN
20.0000 mg | Freq: Once | INTRAVENOUS | Status: AC
  Administered 2022-06-15: 20 mg via INTRAVENOUS
  Filled 2022-06-15: qty 50

## 2022-06-15 MED ORDER — SODIUM CHLORIDE 0.9 % IV SOLN
500.0000 mg | Freq: Once | INTRAVENOUS | Status: AC
  Administered 2022-06-15: 500 mg via INTRAVENOUS
  Filled 2022-06-15: qty 50

## 2022-06-15 MED ORDER — PALONOSETRON HCL INJECTION 0.25 MG/5ML
0.2500 mg | Freq: Once | INTRAVENOUS | Status: AC
  Administered 2022-06-15: 0.25 mg via INTRAVENOUS
  Filled 2022-06-15: qty 5

## 2022-06-15 MED ORDER — HEPARIN SOD (PORK) LOCK FLUSH 100 UNIT/ML IV SOLN
500.0000 [IU] | Freq: Once | INTRAVENOUS | Status: AC | PRN
  Administered 2022-06-15: 500 [IU]
  Filled 2022-06-15: qty 5

## 2022-06-15 MED ORDER — SODIUM CHLORIDE 0.9 % IV SOLN
80.0000 mg/m2 | Freq: Once | INTRAVENOUS | Status: AC
  Administered 2022-06-15: 162 mg via INTRAVENOUS
  Filled 2022-06-15: qty 27

## 2022-06-15 MED ORDER — SODIUM CHLORIDE 0.9% FLUSH
10.0000 mL | INTRAVENOUS | Status: DC | PRN
  Administered 2022-06-15: 10 mL
  Filled 2022-06-15: qty 10

## 2022-06-15 MED ORDER — SODIUM CHLORIDE 0.9 % IV SOLN
10.0000 mg | Freq: Once | INTRAVENOUS | Status: AC
  Administered 2022-06-15: 10 mg via INTRAVENOUS
  Filled 2022-06-15: qty 10

## 2022-06-15 MED ORDER — SODIUM CHLORIDE 0.9 % IV SOLN
Freq: Once | INTRAVENOUS | Status: AC
  Filled 2022-06-15: qty 250

## 2022-06-15 MED ORDER — DIPHENHYDRAMINE HCL 50 MG/ML IJ SOLN
50.0000 mg | Freq: Once | INTRAMUSCULAR | Status: AC
  Administered 2022-06-15: 50 mg via INTRAVENOUS
  Filled 2022-06-15: qty 1

## 2022-06-23 ENCOUNTER — Inpatient Hospital Stay: Payer: Medicare Other | Attending: Oncology

## 2022-06-23 ENCOUNTER — Other Ambulatory Visit: Payer: Self-pay | Admitting: Internal Medicine

## 2022-06-23 ENCOUNTER — Inpatient Hospital Stay: Payer: Medicare Other

## 2022-06-23 DIAGNOSIS — C50811 Malignant neoplasm of overlapping sites of right female breast: Secondary | ICD-10-CM | POA: Insufficient documentation

## 2022-06-23 DIAGNOSIS — Z87891 Personal history of nicotine dependence: Secondary | ICD-10-CM | POA: Diagnosis not present

## 2022-06-23 DIAGNOSIS — Z923 Personal history of irradiation: Secondary | ICD-10-CM | POA: Insufficient documentation

## 2022-06-23 DIAGNOSIS — Z5111 Encounter for antineoplastic chemotherapy: Secondary | ICD-10-CM | POA: Diagnosis present

## 2022-06-23 DIAGNOSIS — Z79899 Other long term (current) drug therapy: Secondary | ICD-10-CM | POA: Insufficient documentation

## 2022-06-23 DIAGNOSIS — Z171 Estrogen receptor negative status [ER-]: Secondary | ICD-10-CM | POA: Diagnosis not present

## 2022-06-23 DIAGNOSIS — Z5189 Encounter for other specified aftercare: Secondary | ICD-10-CM | POA: Insufficient documentation

## 2022-06-23 DIAGNOSIS — Z7982 Long term (current) use of aspirin: Secondary | ICD-10-CM | POA: Insufficient documentation

## 2022-06-23 DIAGNOSIS — Z5112 Encounter for antineoplastic immunotherapy: Secondary | ICD-10-CM | POA: Insufficient documentation

## 2022-06-23 DIAGNOSIS — Z9011 Acquired absence of right breast and nipple: Secondary | ICD-10-CM | POA: Insufficient documentation

## 2022-06-23 LAB — CBC WITH DIFFERENTIAL/PLATELET
Abs Immature Granulocytes: 0.01 10*3/uL (ref 0.00–0.07)
Basophils Absolute: 0 10*3/uL (ref 0.0–0.1)
Basophils Relative: 1 %
Eosinophils Absolute: 0 10*3/uL (ref 0.0–0.5)
Eosinophils Relative: 1 %
HCT: 32.5 % — ABNORMAL LOW (ref 36.0–46.0)
Hemoglobin: 10.2 g/dL — ABNORMAL LOW (ref 12.0–15.0)
Immature Granulocytes: 1 %
Lymphocytes Relative: 62 %
Lymphs Abs: 1.2 10*3/uL (ref 0.7–4.0)
MCH: 27.9 pg (ref 26.0–34.0)
MCHC: 31.4 g/dL (ref 30.0–36.0)
MCV: 88.8 fL (ref 80.0–100.0)
Monocytes Absolute: 0.1 10*3/uL (ref 0.1–1.0)
Monocytes Relative: 5 %
Neutro Abs: 0.6 10*3/uL — ABNORMAL LOW (ref 1.7–7.7)
Neutrophils Relative %: 30 %
Platelets: 185 10*3/uL (ref 150–400)
RBC: 3.66 MIL/uL — ABNORMAL LOW (ref 3.87–5.11)
RDW: 13.4 % (ref 11.5–15.5)
WBC: 2 10*3/uL — ABNORMAL LOW (ref 4.0–10.5)
nRBC: 0 % (ref 0.0–0.2)

## 2022-06-23 LAB — COMPREHENSIVE METABOLIC PANEL
ALT: 21 U/L (ref 0–44)
AST: 22 U/L (ref 15–41)
Albumin: 3.3 g/dL — ABNORMAL LOW (ref 3.5–5.0)
Alkaline Phosphatase: 72 U/L (ref 38–126)
Anion gap: 9 (ref 5–15)
BUN: 13 mg/dL (ref 8–23)
CO2: 28 mmol/L (ref 22–32)
Calcium: 8.6 mg/dL — ABNORMAL LOW (ref 8.9–10.3)
Chloride: 105 mmol/L (ref 98–111)
Creatinine, Ser: 0.78 mg/dL (ref 0.44–1.00)
GFR, Estimated: >60 mL/min (ref >60)
Glucose, Bld: 141 mg/dL — ABNORMAL HIGH (ref 70–99)
Potassium: 3.2 mmol/L — ABNORMAL LOW (ref 3.5–5.1)
Sodium: 142 mmol/L (ref 135–145)
Total Bilirubin: 0.4 mg/dL (ref 0.3–1.2)
Total Protein: 6.5 g/dL (ref 6.5–8.1)

## 2022-06-23 MED FILL — Dexamethasone Sodium Phosphate Inj 100 MG/10ML: INTRAMUSCULAR | Qty: 1 | Status: AC

## 2022-06-23 NOTE — Progress Notes (Signed)
Hold today's treatment per Dr. Rogue Bussing.  Patient to keep next appointment as scheduled.

## 2022-06-25 ENCOUNTER — Other Ambulatory Visit: Payer: Self-pay | Admitting: Family Medicine

## 2022-06-25 DIAGNOSIS — E1169 Type 2 diabetes mellitus with other specified complication: Secondary | ICD-10-CM

## 2022-06-29 ENCOUNTER — Inpatient Hospital Stay: Payer: Medicare Other

## 2022-06-29 ENCOUNTER — Other Ambulatory Visit: Payer: Self-pay | Admitting: Internal Medicine

## 2022-06-29 VITALS — BP 155/71 | HR 66 | Temp 96.2°F | Resp 18 | Ht 66.0 in | Wt 200.2 lb

## 2022-06-29 DIAGNOSIS — C50411 Malignant neoplasm of upper-outer quadrant of right female breast: Secondary | ICD-10-CM

## 2022-06-29 DIAGNOSIS — Z5112 Encounter for antineoplastic immunotherapy: Secondary | ICD-10-CM | POA: Diagnosis not present

## 2022-06-29 DIAGNOSIS — Z171 Estrogen receptor negative status [ER-]: Secondary | ICD-10-CM

## 2022-06-29 LAB — CBC WITH DIFFERENTIAL/PLATELET
Abs Immature Granulocytes: 0.01 10*3/uL (ref 0.00–0.07)
Basophils Absolute: 0 10*3/uL (ref 0.0–0.1)
Basophils Relative: 1 %
Eosinophils Absolute: 0 10*3/uL (ref 0.0–0.5)
Eosinophils Relative: 1 %
HCT: 32.6 % — ABNORMAL LOW (ref 36.0–46.0)
Hemoglobin: 10.3 g/dL — ABNORMAL LOW (ref 12.0–15.0)
Immature Granulocytes: 0 %
Lymphocytes Relative: 56 %
Lymphs Abs: 1.5 10*3/uL (ref 0.7–4.0)
MCH: 28.1 pg (ref 26.0–34.0)
MCHC: 31.6 g/dL (ref 30.0–36.0)
MCV: 88.8 fL (ref 80.0–100.0)
Monocytes Absolute: 0.3 10*3/uL (ref 0.1–1.0)
Monocytes Relative: 12 %
Neutro Abs: 0.8 10*3/uL — ABNORMAL LOW (ref 1.7–7.7)
Neutrophils Relative %: 30 %
Platelets: 234 10*3/uL (ref 150–400)
RBC: 3.67 MIL/uL — ABNORMAL LOW (ref 3.87–5.11)
RDW: 14.6 % (ref 11.5–15.5)
WBC: 2.7 10*3/uL — ABNORMAL LOW (ref 4.0–10.5)
nRBC: 0 % (ref 0.0–0.2)

## 2022-06-29 LAB — COMPREHENSIVE METABOLIC PANEL
ALT: 26 U/L (ref 0–44)
AST: 24 U/L (ref 15–41)
Albumin: 3.4 g/dL — ABNORMAL LOW (ref 3.5–5.0)
Alkaline Phosphatase: 90 U/L (ref 38–126)
Anion gap: 7 (ref 5–15)
BUN: 8 mg/dL (ref 8–23)
CO2: 27 mmol/L (ref 22–32)
Calcium: 8.7 mg/dL — ABNORMAL LOW (ref 8.9–10.3)
Chloride: 105 mmol/L (ref 98–111)
Creatinine, Ser: 0.81 mg/dL (ref 0.44–1.00)
GFR, Estimated: >60 mL/min (ref >60)
Glucose, Bld: 144 mg/dL — ABNORMAL HIGH (ref 70–99)
Potassium: 3.3 mmol/L — ABNORMAL LOW (ref 3.5–5.1)
Sodium: 139 mmol/L (ref 135–145)
Total Bilirubin: 0.5 mg/dL (ref 0.3–1.2)
Total Protein: 6.5 g/dL (ref 6.5–8.1)

## 2022-06-29 MED ORDER — DIPHENHYDRAMINE HCL 50 MG/ML IJ SOLN
50.0000 mg | Freq: Once | INTRAMUSCULAR | Status: AC
  Administered 2022-06-29: 50 mg via INTRAVENOUS
  Filled 2022-06-29: qty 1

## 2022-06-29 MED ORDER — SODIUM CHLORIDE 0.9 % IV SOLN
10.0000 mg | Freq: Once | INTRAVENOUS | Status: AC
  Administered 2022-06-29: 10 mg via INTRAVENOUS
  Filled 2022-06-29: qty 1

## 2022-06-29 MED ORDER — FAMOTIDINE IN NACL 20-0.9 MG/50ML-% IV SOLN
20.0000 mg | Freq: Once | INTRAVENOUS | Status: AC
  Administered 2022-06-29: 20 mg via INTRAVENOUS
  Filled 2022-06-29: qty 50

## 2022-06-29 MED ORDER — SODIUM CHLORIDE 0.9 % IV SOLN
Freq: Once | INTRAVENOUS | Status: AC
  Filled 2022-06-29: qty 250

## 2022-06-29 MED ORDER — HEPARIN SOD (PORK) LOCK FLUSH 100 UNIT/ML IV SOLN
500.0000 [IU] | Freq: Once | INTRAVENOUS | Status: AC | PRN
  Administered 2022-06-29: 500 [IU]
  Filled 2022-06-29: qty 5

## 2022-06-29 MED ORDER — SODIUM CHLORIDE 0.9 % IV SOLN
80.0000 mg/m2 | Freq: Once | INTRAVENOUS | Status: AC
  Administered 2022-06-29: 162 mg via INTRAVENOUS
  Filled 2022-06-29: qty 27

## 2022-06-29 NOTE — Patient Instructions (Signed)
[facility]  Discharge Instructions: Thank you for choosing [facility] to provide your oncology and hematology care.  If you have a lab appointment with the Cancer Center, please go directly to the Cancer Center and check in at the registration area.  Wear comfortable clothing and clothing appropriate for easy access to any Portacath or PICC line.   We strive to give you quality time with your provider. You may need to reschedule your appointment if you arrive late (15 or more minutes).  Arriving late affects you and other patients whose appointments are after yours.  Also, if you miss three or more appointments without notifying the office, you may be dismissed from the clinic at the provider's discretion.      For prescription refill requests, have your pharmacy contact our office and allow 72 hours for refills to be completed.    Today you received the following chemotherapy and/or immunotherapy agents Taxol       To help prevent nausea and vomiting after your treatment, we encourage you to take your nausea medication as directed.  BELOW ARE SYMPTOMS THAT SHOULD BE REPORTED IMMEDIATELY: *FEVER GREATER THAN 100.4 F (38 C) OR HIGHER *CHILLS OR SWEATING *NAUSEA AND VOMITING THAT IS NOT CONTROLLED WITH YOUR NAUSEA MEDICATION *UNUSUAL SHORTNESS OF BREATH *UNUSUAL BRUISING OR BLEEDING *URINARY PROBLEMS (pain or burning when urinating, or frequent urination) *BOWEL PROBLEMS (unusual diarrhea, constipation, pain near the anus) TENDERNESS IN MOUTH AND THROAT WITH OR WITHOUT PRESENCE OF ULCERS (sore throat, sores in mouth, or a toothache) UNUSUAL RASH, SWELLING OR PAIN  UNUSUAL VAGINAL DISCHARGE OR ITCHING   Items with * indicate a potential emergency and should be followed up as soon as possible or go to the Emergency Department if any problems should occur.  Please show the CHEMOTHERAPY ALERT CARD or IMMUNOTHERAPY ALERT CARD at check-in to the  Emergency Department and triage nurse.  Should you have questions after your visit or need to cancel or reschedule your appointment, please contact [facility]  [phone] and follow the prompts.  Office hours are 8:00 a.m. to 4:30 p.m. Monday - Friday. Please note that voicemails left after 4:00 p.m. may not be returned until the following business day.  We are closed weekends and major holidays. You have access to a nurse at all times for urgent questions. Please call the main number to the clinic [phone] and follow the prompts.  For any non-urgent questions, you may also contact your provider using MyChart. We now offer e-Visits for anyone 18 and older to request care online for non-urgent symptoms. For details visit [email or web address].   Also download the MyChart app! Go to the app store, search "MyChart", open the app, select [facility], and log in with your MyChart username and password.  Masks are optional in the cancer centers. If you would like for your care team to wear a mask while they are taking care of you, please let them know. For doctor visits, patients may have with them one support person who is at least 71 years old. At this time, visitors are not allowed in the infusion area.   

## 2022-06-30 ENCOUNTER — Inpatient Hospital Stay: Payer: Medicare Other

## 2022-06-30 DIAGNOSIS — Z5112 Encounter for antineoplastic immunotherapy: Secondary | ICD-10-CM | POA: Diagnosis not present

## 2022-06-30 DIAGNOSIS — C50811 Malignant neoplasm of overlapping sites of right female breast: Secondary | ICD-10-CM

## 2022-06-30 MED ORDER — FILGRASTIM-SNDZ 480 MCG/0.8ML IJ SOSY
480.0000 ug | PREFILLED_SYRINGE | Freq: Once | INTRAMUSCULAR | Status: AC
  Administered 2022-06-30: 480 ug via SUBCUTANEOUS
  Filled 2022-06-30: qty 0.8

## 2022-07-01 ENCOUNTER — Inpatient Hospital Stay: Payer: Medicare Other

## 2022-07-01 DIAGNOSIS — Z171 Estrogen receptor negative status [ER-]: Secondary | ICD-10-CM

## 2022-07-01 DIAGNOSIS — Z5112 Encounter for antineoplastic immunotherapy: Secondary | ICD-10-CM | POA: Diagnosis not present

## 2022-07-01 MED ORDER — FILGRASTIM-SNDZ 480 MCG/0.8ML IJ SOSY
480.0000 ug | PREFILLED_SYRINGE | Freq: Once | INTRAMUSCULAR | Status: AC
  Administered 2022-07-01: 480 ug via SUBCUTANEOUS
  Filled 2022-07-01: qty 0.8

## 2022-07-02 ENCOUNTER — Inpatient Hospital Stay: Payer: Medicare Other

## 2022-07-02 ENCOUNTER — Ambulatory Visit: Payer: Medicare Other

## 2022-07-02 DIAGNOSIS — Z5112 Encounter for antineoplastic immunotherapy: Secondary | ICD-10-CM | POA: Diagnosis not present

## 2022-07-02 DIAGNOSIS — Z171 Estrogen receptor negative status [ER-]: Secondary | ICD-10-CM

## 2022-07-02 MED ORDER — FILGRASTIM-SNDZ 480 MCG/0.8ML IJ SOSY
480.0000 ug | PREFILLED_SYRINGE | Freq: Once | INTRAMUSCULAR | Status: AC
  Administered 2022-07-02: 480 ug via SUBCUTANEOUS
  Filled 2022-07-02: qty 0.8

## 2022-07-05 ENCOUNTER — Encounter: Payer: Self-pay | Admitting: Internal Medicine

## 2022-07-05 ENCOUNTER — Ambulatory Visit: Payer: Medicare Other

## 2022-07-05 ENCOUNTER — Inpatient Hospital Stay (HOSPITAL_BASED_OUTPATIENT_CLINIC_OR_DEPARTMENT_OTHER): Payer: Medicare Other | Admitting: Internal Medicine

## 2022-07-05 ENCOUNTER — Other Ambulatory Visit: Payer: Medicare Other

## 2022-07-05 DIAGNOSIS — Z171 Estrogen receptor negative status [ER-]: Secondary | ICD-10-CM

## 2022-07-05 DIAGNOSIS — C50411 Malignant neoplasm of upper-outer quadrant of right female breast: Secondary | ICD-10-CM | POA: Diagnosis not present

## 2022-07-05 DIAGNOSIS — Z5112 Encounter for antineoplastic immunotherapy: Secondary | ICD-10-CM | POA: Diagnosis not present

## 2022-07-05 NOTE — Progress Notes (Signed)
[facility] [place] NOTE  Patient Care Team: Steele Sizer, MD as PCP - General (Family Medicine) Bary Castilla, Forest Gleason, MD (General Surgery) Carloyn Manner, MD as Referring Physician (Otolaryngology) Yolonda Kida, MD as Consulting Physician (Cardiology) Ree Edman, MD as Referring Physician (Dermatology) Lonia Farber, MD as Consulting Physician (Internal Medicine) Cammie Sickle, MD as Consulting Physician (Internal Medicine)  CHIEF COMPLAINTS/PURPOSE OF CONSULTATION: Breast cancer  #  Oncology History Overview Note  She has a remote history of right breast cancer, T1bN0, ER positive,s/p lumpectomy and MammoSite radiation and 5 years of Arimidex.    02/19/22 screening mammogram bilaterally showed  1. Suspicious right breast mass at the 10 o'clock position 8 cm from the nipple. Recommend ultrasound-guided biopsy. 2. Indeterminate right breast mass at the 6 o'clock position 4 cm from the nipple. Recommend ultrasound-guided biopsy. 3. No suspicious right axillary lymphadenopathy.   04/12/2022 Unilateral right breast diagnostic mammogram showed 1. Suspicious right breast mass at the 10 o'clock position 8 cm from the nipple. Recommend ultrasound-guided biopsy. 2. Indeterminate right breast mass at the 6 o'clock position 4 cm from the nipple. Recommend ultrasound-guided biopsy. 3. No suspicious right axillary lymphadenopathy.   04/13/2022 right breast mass 6 o'clock position 1 cm from the nipple showed invasive mammary carcinoma, no special type. Grade 3, DCIS present high grade, LVI not identified. ER-, PR 1-10%, HER 2 -   04/13/2022, right breast mass 10:00 7 cm from nipple biopsy showed invasive mammary carcinoma, grade 3, high-grade DCIS with focal comedonecrosis, lymphovascular invasion not identified.  ER -, PR 1 to 10%, HER2-    04/26/2022, right breast mastectomy with axillary dissection Invasive mammary carcinoma, no special type, multifocal, DCIS  high-grade, benign nipple/areola.  2 deposits of invasive mammary carcinoma, no definite residual lymph node identified.   Breast cancer ([place])  05/10/2022 Initial Diagnosis   Breast cancer ([place])   05/10/2022 Cancer Staging   Staging form: Breast, AJCC 8th Edition - Pathologic stage from 05/10/2022: Stage IIA (pT1c, pN1a, cM0, G3, ER-, PR+, HER2-) - Signed by Earlie Server, MD on 05/10/2022 Stage prefix: Initial diagnosis Histologic grading system: 3 grade system   05/24/2022 - 05/24/2022 Chemotherapy   Patient is on Treatment Plan : BREAST Pembrolizumab (200) D1 + Carboplatin (5) D1 + Paclitaxel (80) D1,8,15 q21d X 4 cycles / Pembrolizumab (200) D1 + AC D1 q21d x 4 cycles     05/27/2022 -  Chemotherapy   Patient is on Treatment Plan : BREAST Pembrolizumab (200) D1 + Carboplatin (5) D1 + Paclitaxel (80) D1,8,15 q21d X 4 cycles / Pembrolizumab (200) D1 + AC D1 q21d x 4 cycles     Carcinoma of upper-outer quadrant of right breast in female, estrogen receptor negative ([place])  05/26/2022 Initial Diagnosis   Carcinoma of upper-outer quadrant of right breast in female, estrogen receptor negative ([place])   05/26/2022 Cancer Staging   Staging form: Breast, AJCC 8th Edition - Pathologic: Stage IIA (pT1c, pN1, cM0, G3, ER-, PR-, HER2-) - Signed by Cammie Sickle, MD on 05/26/2022 Multigene prognostic tests performed: None Histologic grading system: 3 grade system     HISTORY OF PRESENTING ILLNESS: Ambulating independently.  Alone.  Morgan Peterson 71 y.o.  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  Patient denies any tingling or numbness.  Denies any nausea vomiting.  Appetite is good.  No weight loss.  Review of Systems  Constitutional:  Negative for chills, diaphoresis, fever, malaise/fatigue and weight loss.  HENT:  Negative for nosebleeds and sore throat.   Eyes:   Negative for double vision.  Respiratory:  Negative for cough, hemoptysis, sputum production, shortness of breath and wheezing.   Cardiovascular:  Negative for chest pain, palpitations, orthopnea and leg swelling.  Gastrointestinal:  Negative for abdominal pain, blood in stool, constipation, diarrhea, heartburn, melena, nausea and vomiting.  Genitourinary:  Negative for dysuria, frequency and urgency.  Musculoskeletal:  Negative for back pain and joint pain.  Skin: Negative.  Negative for itching and rash.  Neurological:  Negative for dizziness, tingling, focal weakness, weakness and headaches.  Endo/Heme/Allergies:  Does not bruise/bleed easily.  Psychiatric/Behavioral:  Negative for depression. The patient is not nervous/anxious and does not have insomnia.      MEDICAL HISTORY:  Past Medical History:  Diagnosis Date   Cervical radiculopathy    Hiatal hernia    Hypercholesterolemia    Hyperlipidemia    Hypertension    IBS (irritable bowel syndrome)    Malignant neoplasm of upper-outer quadrant of female breast ([place]) 04/11/2009   Right breast, invasive ductal carcinoma, 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 Arimidex therapy completed September 2015   Mild mitral valve prolapse    Personal history of malignant neoplasm of breast 2010   Personal history of radiation therapy 2010   mammosite   T2DM (type 2 diabetes mellitus) ([place]) 2011    SURGICAL HISTORY: Past Surgical History:  Procedure Laterality Date   ABDOMINAL EXPLORATION SURGERY  2000   ovarian cyst   BREAST BIOPSY Right 2010   +    BREAST EXCISIONAL BIOPSY Right 2010   + mammo site inasive mammo ca   BREAST LUMPECTOMY Right 2010   [facility]   COLONOSCOPY  07/19/2012   Normal exam, Dr. Bary Castilla   COLONOSCOPY WITH PROPOFOL N/A 04/21/2022   Procedure: COLONOSCOPY WITH PROPOFOL;  Surgeon: Robert Bellow, MD;  Location: [facility] ENDOSCOPY;  Service: Endoscopy;  Laterality:  N/A;   MASTECTOMY Right 2010   partial/ lumpectomy   PORTACATH PLACEMENT Left 05/19/2022   Procedure: INSERTION PORT-A-CATH;  Surgeon: Robert Bellow, MD;  Location: [facility] ORS;  Service: General;  Laterality: Left;   SIMPLE MASTECTOMY WITH AXILLARY SENTINEL NODE BIOPSY Right 04/26/2022   Procedure: SIMPLE MASTECTOMY WITH AXILLARY SENTINEL NODE BIOPSY;  Surgeon: Robert Bellow, MD;  Location: [facility] ORS;  Service: General;  Laterality: Right;  RNFA to assist   TUBAL LIGATION  20 years ago    SOCIAL HISTORY: Social History   Socioeconomic History   Marital status: Married    Spouse name: Antonio   Number of children: 2   Years of education: Not on file   Highest education level: Not on file  Occupational History   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  Tobacco Use   Smoking status: Former    Packs/day: 0.25    Years: 10.00    Total pack years: 2.50    Types: Cigarettes    Start date: 1978    Quit date: 1988    Years since quitting: 35.5   Smokeless tobacco: Never   Tobacco comments:    smoking cessation materials not required  Vaping Use   Vaping Use: Never used  Substance and Sexual Activity   Alcohol use: Yes    Comment: wine occ   Drug use: No   Sexual activity: Not Currently  Comment: husband has ED  Other Topics Concern   Not on file  Social History Narrative   Oncology nurse on the floor retired.;  Husband retired from [employer].  No smoking.   Social Determinants of Health   Financial Resource Strain: Low Risk  (12/31/2021)   Overall Financial Resource Strain (CARDIA)    Difficulty of Paying Living Expenses: Not hard at all  Food Insecurity: No Food Insecurity (12/31/2021)   Hunger Vital Sign    Worried About Running Out of Food in the Last Year: Never true    Ran Out of Food in the Last Year: Never true  Transportation Needs: No Transportation Needs (12/31/2021)   PRAPARE - Hydrologist (Medical): No    Lack of  Transportation (Non-Medical): No  Physical Activity: Inactive (12/31/2021)   Exercise Vital Sign    Days of Exercise per Week: 0 days    Minutes of Exercise per Session: 0 min  Stress: No Stress Concern Present (12/31/2021)   [place]    Feeling of Stress : Only a little  Social Connections: Moderately Integrated (12/31/2021)   Social Connection and Isolation Panel [NHANES]    Frequency of Communication with Friends and Family: More than three times a week    Frequency of Social Gatherings with Friends and Family: Three times a week    Attends Religious Services: More than 4 times per year    Active Member of Clubs or Organizations: No    Attends Archivist Meetings: Never    Marital Status: Married  Human resources officer Violence: Not At Risk (12/31/2021)   Humiliation, Afraid, Rape, and Kick questionnaire    Fear of Current or Ex-Partner: No    Emotionally Abused: No    Physically Abused: No    Sexually Abused: No    FAMILY HISTORY: Family History  Problem Relation Age of Onset   Osteoporosis Mother    Diabetes Father    Kidney disease Father    Diabetes Brother    Breast cancer Maternal Aunt    Bladder Cancer Maternal Aunt    Cervical cancer Maternal Aunt    Colon cancer Maternal Uncle    Lung cancer Maternal Uncle     ALLERGIES:  is allergic to levaquin [levofloxacin in d5w], losartan, metoprolol, amlodipine-olmesartan, canagliflozin, egg white [egg white (egg protein)], gabapentin, glipizide, gluten meal, lisinopril, metformin and related, milk (cow), milk-related compounds, onglyza [saxagliptin], shrimp extract allergy skin test, tramadol, actos [pioglitazone], atorvastatin, carvedilol, and prednisone.  MEDICATIONS:  Current Outpatient Medications  Medication Sig Dispense Refill   aspirin EC 81 MG tablet Take 1 tablet (81 mg total) by mouth daily. 30 tablet 0   atenolol (TENORMIN) 25 MG tablet Take  1 tablet (25 mg total) by mouth daily. (Patient taking differently: Take 25 mg by mouth every morning.) 90 tablet 1   azelastine (ASTELIN) 0.1 % nasal spray Place 2 sprays into both nostrils daily as needed for allergies or rhinitis. Use in each nostril as directed     betamethasone, augmented, (DIPROLENE) 0.05 % lotion Apply 1 application topically 2 (two) times daily.     Blood Glucose Monitoring Suppl (ONETOUCH VERIO) w/Device KIT 1 Device by Does not apply route once. 1 kit 0   cholecalciferol (VITAMIN D) 1000 UNITS tablet Take 1,000 Units by mouth daily.     Cyanocobalamin (VITAMIN B-12 PO) Take 1 tablet by mouth daily as needed (fatigue).     EPINEPHrine (  EPIPEN 2-PAK) 0.3 mg/0.3 mL IJ SOAJ injection Inject 0.3 mg into the muscle as needed for anaphylaxis. 2 each 0   famotidine (PEPCID) 20 MG tablet Take 20 mg by mouth 2 (two) times daily.     glucose blood (ONETOUCH VERIO) test strip Use as instructed 100 each 12   Lancets (ONETOUCH ULTRASOFT) lancets Use as instructed 100 each 3   levocetirizine (XYZAL) 5 MG tablet Take 1 tablet (5 mg total) by mouth every evening. (Patient taking differently: Take 5 mg by mouth daily as needed for allergies.) 90 tablet 1   lidocaine-prilocaine (EMLA) cream Apply 1 application. topically as needed. Apply to port and cover with saran wrap 1-2 hours prior to port access 30 g 1   montelukast (SINGULAIR) 10 MG tablet Take 1 tablet (10 mg total) by mouth at bedtime. (Patient taking differently: Take 10 mg by mouth at bedtime as needed (allergies).) 90 tablet 1   Multiple Vitamin (MULTIVITAMIN WITH MINERALS) TABS tablet Take 1 tablet by mouth 2 (two) times a week.     ondansetron (ZOFRAN) 8 MG tablet Take 1 tablet (8 mg total) by mouth every 8 (eight) hours as needed for nausea or vomiting. 40 tablet 1   OZEMPIC, 0.25 OR 0.5 MG/DOSE, 2 MG/1.5ML SOPN Inject 0.5 mg into the skin every Sunday.     prochlorperazine (COMPAZINE) 10 MG tablet Take 1 tablet (10 mg total)  by mouth every 6 (six) hours as needed for nausea or vomiting. 40 tablet 1   rizatriptan (MAXALT-MLT) 10 MG disintegrating tablet Take 5-10 mg by mouth as needed for migraine. May repeat in 2 hours if needed     rosuvastatin (CRESTOR) 10 MG tablet Take 1 tablet (10 mg total) by mouth every morning. 90 tablet 1   triamterene-hydrochlorothiazide (MAXZIDE-25) 37.5-25 MG tablet TAKE 1 TABLET BY MOUTH EVERY DAY (Patient taking differently: Take 1 tablet by mouth every morning.) 90 tablet 0   No current facility-administered medications for this visit.      Marland Kitchen  PHYSICAL EXAMINATION: ECOG PERFORMANCE STATUS: 0 - Asymptomatic  Vitals:   07/05/22 0958  BP: 135/71  Pulse: 73  Temp: 97.7 F (36.5 C)  SpO2: 100%   Filed Weights   07/05/22 0958  Weight: 197 lb 6.4 oz (89.5 kg)    Physical Exam Vitals and nursing note reviewed.  HENT:     Head: Normocephalic and atraumatic.     Mouth/Throat:     Pharynx: Oropharynx is clear.  Eyes:     Extraocular Movements: Extraocular movements intact.     Pupils: Pupils are equal, round, and reactive to light.  Cardiovascular:     Rate and Rhythm: Normal rate and regular rhythm.  Pulmonary:     Comments: Decreased breath sounds bilaterally.  Abdominal:     Palpations: Abdomen is soft.  Musculoskeletal:        General: Normal range of motion.     Cervical back: Normal range of motion.  Skin:    General: Skin is warm.  Neurological:     General: No focal deficit present.     Mental Status: She is alert and oriented to person, place, and time.  Psychiatric:        Behavior: Behavior normal.        Judgment: Judgment normal.      LABORATORY DATA:  I have reviewed the data as listed Lab Results  Component Value Date   WBC 2.7 (L) 06/29/2022   HGB 10.3 (L) 06/29/2022  HCT 32.6 (L) 06/29/2022   MCV 88.8 06/29/2022   PLT 234 06/29/2022   Recent Labs    06/15/22 0805 06/23/22 0753 06/29/22 0748  NA 137 142 139  K 3.5 3.2* 3.3*   CL 102 105 105  CO2 '27 28 27  ' GLUCOSE 139* 141* 144*  BUN '10 13 8  ' CREATININE 0.68 0.78 0.81  CALCIUM 8.5* 8.6* 8.7*  GFRNONAA >60 >60 >60  PROT 6.8 6.5 6.5  ALBUMIN 3.5 3.3* 3.4*  AST '19 22 24  ' ALT '21 21 26  ' ALKPHOS 84 72 90  BILITOT 0.4 0.4 0.5    RADIOGRAPHIC STUDIES: I have personally reviewed the radiological images as listed and agreed with the findings in the report. No results found.  ASSESSMENT & PLAN:   Carcinoma of upper-outer quadrant of right breast in female, estrogen receptor negative ([place]) # Stage II triple negative breast cancer [mT1cN1; Grade 3 ]- s/p mastectomy.  Patient currently on adjuvant chemoimmunotherapy [JUVQQUI 114 study]  # Proceed with cycle # 3 day 1 tomorrow CarboTaxol Keytruda.  We will dose reduce carboplatin to AUC 4 -given recent neutropenia.  Awaiting labs tomorrow.  JUNE 2023-  TSH-WNL.  June 6th- ECHO- 05/25/2022- 65-70%.   #Genetic counseling: Pending  # DISPOSITION: # as planned 7/18- carbo-taxol-keytruda #July 25- labs- cbc/cmp; taxol # Aug 1st-labs-cbc/cmp;taxol  # Zarxio-8/2; 8/3; 8/4   # Follow up MD- in 3 weeks-;labs- cbc/cmp; D-2 carbo-taxol-keytruda- Dr.B     All questions were answered. The patient/family knows to call the clinic with any problems, questions or concerns.       Cammie Sickle, MD 07/05/2022 10:11 PM

## 2022-07-05 NOTE — Progress Notes (Signed)
C/o ears feeling plugged up.

## 2022-07-05 NOTE — Assessment & Plan Note (Addendum)
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  Patient currently on adjuvant chemoimmunotherapy [MVHQION 629 study]  # Proceed with cycle # 3 day 1 tomorrow CarboTaxol Keytruda.  We will dose reduce carboplatin to AUC 4 -given recent neutropenia.  Awaiting labs tomorrow.  JUNE 2023-  TSH-WNL.  June 6th- ECHO- 05/25/2022- 65-70%.   #Genetic counseling: Pending  # DISPOSITION: # as planned 7/18- carbo-taxol-keytruda #July 25- labs- cbc/cmp; taxol # Aug 1st-labs-cbc/cmp;taxol  # Zarxio-8/2; 8/3; 8/4   # Follow up MD- in 3 weeks-;labs- cbc/cmp; D-2 carbo-taxol-keytruda- Dr.B

## 2022-07-06 ENCOUNTER — Inpatient Hospital Stay: Payer: Medicare Other

## 2022-07-06 VITALS — BP 133/73 | HR 74 | Temp 96.7°F | Resp 18 | Wt 192.1 lb

## 2022-07-06 DIAGNOSIS — Z5112 Encounter for antineoplastic immunotherapy: Secondary | ICD-10-CM | POA: Diagnosis not present

## 2022-07-06 DIAGNOSIS — Z171 Estrogen receptor negative status [ER-]: Secondary | ICD-10-CM

## 2022-07-06 LAB — CBC WITH DIFFERENTIAL/PLATELET
Abs Immature Granulocytes: 0.34 10*3/uL — ABNORMAL HIGH (ref 0.00–0.07)
Basophils Absolute: 0.1 10*3/uL (ref 0.0–0.1)
Basophils Relative: 2 %
Eosinophils Absolute: 0 10*3/uL (ref 0.0–0.5)
Eosinophils Relative: 1 %
HCT: 33.3 % — ABNORMAL LOW (ref 36.0–46.0)
Hemoglobin: 10.5 g/dL — ABNORMAL LOW (ref 12.0–15.0)
Immature Granulocytes: 6 %
Lymphocytes Relative: 39 %
Lymphs Abs: 2.3 10*3/uL (ref 0.7–4.0)
MCH: 28.2 pg (ref 26.0–34.0)
MCHC: 31.5 g/dL (ref 30.0–36.0)
MCV: 89.3 fL (ref 80.0–100.0)
Monocytes Absolute: 0.7 10*3/uL (ref 0.1–1.0)
Monocytes Relative: 12 %
Neutro Abs: 2.3 10*3/uL (ref 1.7–7.7)
Neutrophils Relative %: 40 %
Platelets: 221 10*3/uL (ref 150–400)
RBC: 3.73 MIL/uL — ABNORMAL LOW (ref 3.87–5.11)
RDW: 14.6 % (ref 11.5–15.5)
WBC: 5.7 10*3/uL (ref 4.0–10.5)
nRBC: 0.7 % — ABNORMAL HIGH (ref 0.0–0.2)

## 2022-07-06 LAB — COMPREHENSIVE METABOLIC PANEL
ALT: 20 U/L (ref 0–44)
AST: 20 U/L (ref 15–41)
Albumin: 3.5 g/dL (ref 3.5–5.0)
Alkaline Phosphatase: 87 U/L (ref 38–126)
Anion gap: 7 (ref 5–15)
BUN: 11 mg/dL (ref 8–23)
CO2: 27 mmol/L (ref 22–32)
Calcium: 8.5 mg/dL — ABNORMAL LOW (ref 8.9–10.3)
Chloride: 108 mmol/L (ref 98–111)
Creatinine, Ser: 0.89 mg/dL (ref 0.44–1.00)
GFR, Estimated: >60 mL/min (ref >60)
Glucose, Bld: 142 mg/dL — ABNORMAL HIGH (ref 70–99)
Potassium: 3.6 mmol/L (ref 3.5–5.1)
Sodium: 142 mmol/L (ref 135–145)
Total Bilirubin: 0.5 mg/dL (ref 0.3–1.2)
Total Protein: 6.6 g/dL (ref 6.5–8.1)

## 2022-07-06 MED ORDER — SODIUM CHLORIDE 0.9 % IV SOLN
400.0000 mg | Freq: Once | INTRAVENOUS | Status: AC
  Administered 2022-07-06: 400 mg via INTRAVENOUS
  Filled 2022-07-06: qty 40

## 2022-07-06 MED ORDER — PALONOSETRON HCL INJECTION 0.25 MG/5ML
0.2500 mg | Freq: Once | INTRAVENOUS | Status: AC
  Administered 2022-07-06: 0.25 mg via INTRAVENOUS
  Filled 2022-07-06 (×2): qty 5

## 2022-07-06 MED ORDER — HEPARIN SOD (PORK) LOCK FLUSH 100 UNIT/ML IV SOLN
INTRAVENOUS | Status: DC
Start: 2022-07-06 — End: 2022-07-06
  Filled 2022-07-06: qty 5

## 2022-07-06 MED ORDER — HEPARIN SOD (PORK) LOCK FLUSH 100 UNIT/ML IV SOLN
500.0000 [IU] | Freq: Once | INTRAVENOUS | Status: AC | PRN
  Administered 2022-07-06: 500 [IU]
  Filled 2022-07-06: qty 5

## 2022-07-06 MED ORDER — SODIUM CHLORIDE 0.9 % IV SOLN
200.0000 mg | Freq: Once | INTRAVENOUS | Status: AC
  Administered 2022-07-06: 200 mg via INTRAVENOUS
  Filled 2022-07-06: qty 8

## 2022-07-06 MED ORDER — SODIUM CHLORIDE 0.9 % IV SOLN
150.0000 mg | Freq: Once | INTRAVENOUS | Status: AC
  Administered 2022-07-06: 150 mg via INTRAVENOUS
  Filled 2022-07-06: qty 150

## 2022-07-06 MED ORDER — DIPHENHYDRAMINE HCL 50 MG/ML IJ SOLN
50.0000 mg | Freq: Once | INTRAMUSCULAR | Status: AC
  Administered 2022-07-06: 50 mg via INTRAVENOUS
  Filled 2022-07-06: qty 1

## 2022-07-06 MED ORDER — FAMOTIDINE IN NACL 20-0.9 MG/50ML-% IV SOLN
20.0000 mg | Freq: Once | INTRAVENOUS | Status: AC
  Administered 2022-07-06: 20 mg via INTRAVENOUS
  Filled 2022-07-06: qty 50

## 2022-07-06 MED ORDER — SODIUM CHLORIDE 0.9 % IV SOLN
Freq: Once | INTRAVENOUS | Status: AC
  Filled 2022-07-06: qty 250

## 2022-07-06 MED ORDER — SODIUM CHLORIDE 0.9 % IV SOLN
80.0000 mg/m2 | Freq: Once | INTRAVENOUS | Status: AC
  Administered 2022-07-06: 162 mg via INTRAVENOUS
  Filled 2022-07-06: qty 27

## 2022-07-06 MED ORDER — SODIUM CHLORIDE 0.9 % IV SOLN
10.0000 mg | Freq: Once | INTRAVENOUS | Status: AC
  Administered 2022-07-06: 10 mg via INTRAVENOUS
  Filled 2022-07-06: qty 10

## 2022-07-06 NOTE — Patient Instructions (Signed)
[facility] [place]  Discharge Instructions: Thank you for choosing [facility] to provide your oncology and hematology care.  If you have a lab appointment with the Cancer Center, please go directly to the Cancer Center and check in at the registration area.  Wear comfortable clothing and clothing appropriate for easy access to any Portacath or PICC line.   We strive to give you quality time with your provider. You may need to reschedule your appointment if you arrive late (15 or more minutes).  Arriving late affects you and other patients whose appointments are after yours.  Also, if you miss three or more appointments without notifying the office, you may be dismissed from the clinic at the provider's discretion.      For prescription refill requests, have your pharmacy contact our office and allow 72 hours for refills to be completed.    Today you received the following chemotherapy and/or immunotherapy agents Keytruda, Taxol and Carboplatin       To help prevent nausea and vomiting after your treatment, we encourage you to take your nausea medication as directed.  BELOW ARE SYMPTOMS THAT SHOULD BE REPORTED IMMEDIATELY: *FEVER GREATER THAN 100.4 F (38 C) OR HIGHER *CHILLS OR SWEATING *NAUSEA AND VOMITING THAT IS NOT CONTROLLED WITH YOUR NAUSEA MEDICATION *UNUSUAL SHORTNESS OF BREATH *UNUSUAL BRUISING OR BLEEDING *URINARY PROBLEMS (pain or burning when urinating, or frequent urination) *BOWEL PROBLEMS (unusual diarrhea, constipation, pain near the anus) TENDERNESS IN MOUTH AND THROAT WITH OR WITHOUT PRESENCE OF ULCERS (sore throat, sores in mouth, or a toothache) UNUSUAL RASH, SWELLING OR PAIN  UNUSUAL VAGINAL DISCHARGE OR ITCHING   Items with * indicate a potential emergency and should be followed up as soon as possible or go to the Emergency Department if any problems should occur.  Please show the CHEMOTHERAPY ALERT CARD or IMMUNOTHERAPY  ALERT CARD at check-in to the Emergency Department and triage nurse.  Should you have questions after your visit or need to cancel or reschedule your appointment, please contact [facility] [place]  [phone] and follow the prompts.  Office hours are 8:00 a.m. to 4:30 p.m. Monday - Friday. Please note that voicemails left after 4:00 p.m. may not be returned until the following business day.  We are closed weekends and major holidays. You have access to a nurse at all times for urgent questions. Please call the main number to the clinic [phone] and follow the prompts.  For any non-urgent questions, you may also contact your provider using MyChart. We now offer e-Visits for anyone 18 and older to request care online for non-urgent symptoms. For details visit [email or web address].   Also download the MyChart app! Go to the app store, search "MyChart", open the app, select [facility], and log in with your MyChart username and password.  Masks are optional in the cancer centers. If you would like for your care team to wear a mask while they are taking care of you, please let them know. For doctor visits, patients may have with them one support person who is at least 71 years old. At this time, visitors are not allowed in the infusion area.   

## 2022-07-12 ENCOUNTER — Other Ambulatory Visit: Payer: Self-pay

## 2022-07-13 ENCOUNTER — Other Ambulatory Visit: Payer: Self-pay | Admitting: Medical Oncology

## 2022-07-13 ENCOUNTER — Inpatient Hospital Stay: Payer: Medicare Other

## 2022-07-13 ENCOUNTER — Ambulatory Visit: Payer: Medicare Other

## 2022-07-13 ENCOUNTER — Other Ambulatory Visit: Payer: Medicare Other

## 2022-07-13 VITALS — BP 125/76 | HR 82 | Temp 97.8°F | Resp 18 | Ht 66.0 in | Wt 196.2 lb

## 2022-07-13 DIAGNOSIS — Z5112 Encounter for antineoplastic immunotherapy: Secondary | ICD-10-CM | POA: Diagnosis not present

## 2022-07-13 DIAGNOSIS — C50811 Malignant neoplasm of overlapping sites of right female breast: Secondary | ICD-10-CM

## 2022-07-13 DIAGNOSIS — E876 Hypokalemia: Secondary | ICD-10-CM

## 2022-07-13 DIAGNOSIS — Z171 Estrogen receptor negative status [ER-]: Secondary | ICD-10-CM

## 2022-07-13 LAB — COMPREHENSIVE METABOLIC PANEL
ALT: 23 U/L (ref 0–44)
AST: 21 U/L (ref 15–41)
Albumin: 3.4 g/dL — ABNORMAL LOW (ref 3.5–5.0)
Alkaline Phosphatase: 73 U/L (ref 38–126)
Anion gap: 7 (ref 5–15)
BUN: 11 mg/dL (ref 8–23)
CO2: 29 mmol/L (ref 22–32)
Calcium: 8.8 mg/dL — ABNORMAL LOW (ref 8.9–10.3)
Chloride: 105 mmol/L (ref 98–111)
Creatinine, Ser: 0.67 mg/dL (ref 0.44–1.00)
GFR, Estimated: >60 mL/min (ref >60)
Glucose, Bld: 159 mg/dL — ABNORMAL HIGH (ref 70–99)
Potassium: 3 mmol/L — ABNORMAL LOW (ref 3.5–5.1)
Sodium: 141 mmol/L (ref 135–145)
Total Bilirubin: 0.4 mg/dL (ref 0.3–1.2)
Total Protein: 6.6 g/dL (ref 6.5–8.1)

## 2022-07-13 LAB — CBC WITH DIFFERENTIAL/PLATELET
Abs Immature Granulocytes: 0.02 10*3/uL (ref 0.00–0.07)
Basophils Absolute: 0 10*3/uL (ref 0.0–0.1)
Basophils Relative: 1 %
Eosinophils Absolute: 0 10*3/uL (ref 0.0–0.5)
Eosinophils Relative: 1 %
HCT: 31 % — ABNORMAL LOW (ref 36.0–46.0)
Hemoglobin: 9.9 g/dL — ABNORMAL LOW (ref 12.0–15.0)
Immature Granulocytes: 1 %
Lymphocytes Relative: 42 %
Lymphs Abs: 1.2 10*3/uL (ref 0.7–4.0)
MCH: 28.4 pg (ref 26.0–34.0)
MCHC: 31.9 g/dL (ref 30.0–36.0)
MCV: 88.8 fL (ref 80.0–100.0)
Monocytes Absolute: 0.1 10*3/uL (ref 0.1–1.0)
Monocytes Relative: 4 %
Neutro Abs: 1.4 10*3/uL — ABNORMAL LOW (ref 1.7–7.7)
Neutrophils Relative %: 51 %
Platelets: 166 10*3/uL (ref 150–400)
RBC: 3.49 MIL/uL — ABNORMAL LOW (ref 3.87–5.11)
RDW: 14.4 % (ref 11.5–15.5)
Smear Review: NORMAL
WBC: 2.8 10*3/uL — ABNORMAL LOW (ref 4.0–10.5)
nRBC: 0 % (ref 0.0–0.2)

## 2022-07-13 MED ORDER — HEPARIN SOD (PORK) LOCK FLUSH 100 UNIT/ML IV SOLN
500.0000 [IU] | Freq: Once | INTRAVENOUS | Status: AC | PRN
  Administered 2022-07-13: 500 [IU]
  Filled 2022-07-13: qty 5

## 2022-07-13 MED ORDER — SODIUM CHLORIDE 0.9 % IV SOLN
80.0000 mg/m2 | Freq: Once | INTRAVENOUS | Status: AC
  Administered 2022-07-13: 162 mg via INTRAVENOUS
  Filled 2022-07-13: qty 27

## 2022-07-13 MED ORDER — SODIUM CHLORIDE 0.9 % IV SOLN
Freq: Once | INTRAVENOUS | Status: AC
  Filled 2022-07-13: qty 250

## 2022-07-13 MED ORDER — POTASSIUM CHLORIDE 20 MEQ/100ML IV SOLN: 20.0000 meq | Freq: Once | INTRAVENOUS | Status: DC

## 2022-07-13 MED ORDER — DIPHENHYDRAMINE HCL 50 MG/ML IJ SOLN
50.0000 mg | Freq: Once | INTRAMUSCULAR | Status: AC
  Administered 2022-07-13: 50 mg via INTRAVENOUS
  Filled 2022-07-13: qty 1

## 2022-07-13 MED ORDER — SODIUM CHLORIDE 0.9 % IV SOLN
10.0000 mg | Freq: Once | INTRAVENOUS | Status: AC
  Administered 2022-07-13: 10 mg via INTRAVENOUS
  Filled 2022-07-13: qty 1

## 2022-07-13 MED ORDER — POTASSIUM CHLORIDE CRYS ER 10 MEQ PO TBCR: 10.0000 meq | EXTENDED_RELEASE_TABLET | Freq: Every day | ORAL | 6 refills | Status: DC

## 2022-07-13 MED ORDER — POTASSIUM CHLORIDE 20 MEQ/100ML IV SOLN
20.0000 meq | Freq: Once | INTRAVENOUS | Status: AC
  Administered 2022-07-13: 20 meq via INTRAVENOUS

## 2022-07-13 MED ORDER — FAMOTIDINE IN NACL 20-0.9 MG/50ML-% IV SOLN
20.0000 mg | Freq: Once | INTRAVENOUS | Status: AC
  Administered 2022-07-13: 20 mg via INTRAVENOUS
  Filled 2022-07-13: qty 50

## 2022-07-13 NOTE — Patient Instructions (Signed)
[facility] CANCER CTR AT [place]  Discharge Instructions: Thank you for choosing [facility] to provide your oncology and hematology care.  If you have a lab appointment with the [facility], please go directly to the [facility] and check in at the registration area.  Wear comfortable clothing and clothing appropriate for easy access to any Portacath or PICC line.   We strive to give you quality time with your provider. You may need to reschedule your appointment if you arrive late (15 or more minutes).  Arriving late affects you and other patients whose appointments are after yours.  Also, if you miss three or more appointments without notifying the office, you may be dismissed from the clinic at the provider's discretion.      For prescription refill requests, have your pharmacy contact our office and allow 72 hours for refills to be completed.    Today you received the following chemotherapy and/or immunotherapy agents TAXOL      To help prevent nausea and vomiting after your treatment, we encourage you to take your nausea medication as directed.  BELOW ARE SYMPTOMS THAT SHOULD BE REPORTED IMMEDIATELY: *FEVER GREATER THAN 100.4 F (38 C) OR HIGHER *CHILLS OR SWEATING *NAUSEA AND VOMITING THAT IS NOT CONTROLLED WITH YOUR NAUSEA MEDICATION *UNUSUAL SHORTNESS OF BREATH *UNUSUAL BRUISING OR BLEEDING *URINARY PROBLEMS (pain or burning when urinating, or frequent urination) *BOWEL PROBLEMS (unusual diarrhea, constipation, pain near the anus) TENDERNESS IN MOUTH AND THROAT WITH OR WITHOUT PRESENCE OF ULCERS (sore throat, sores in mouth, or a toothache) UNUSUAL RASH, SWELLING OR PAIN  UNUSUAL VAGINAL DISCHARGE OR ITCHING   Items with * indicate a potential emergency and should be followed up as soon as possible or go to the Emergency Department if any problems should occur.  Please show the CHEMOTHERAPY ALERT CARD or IMMUNOTHERAPY ALERT CARD at check-in to the  Emergency Department and triage nurse.  Should you have questions after your visit or need to cancel or reschedule your appointment, please contact [facility] CANCER [place] AT [place]  [phone] and follow the prompts.  Office hours are 8:00 a.m. to 4:30 p.m. Monday - Friday. Please note that voicemails left after 4:00 p.m. may not be returned until the following business day.  We are closed weekends and major holidays. You have access to a nurse at all times for urgent questions. Please call the main number to the clinic [phone] and follow the prompts.  For any non-urgent questions, you may also contact your provider using MyChart. We now offer e-Visits for anyone 89 and older to request care online for non-urgent symptoms. For details visit [email or web address].   Also download the MyChart app! Go to the app store, search "MyChart", open the app, select [facility], and log in with your MyChart username and password.  Masks are optional in the cancer centers. If you would like for your care team to wear a mask while they are taking care of you, please let them know. For doctor visits, patients may have with them one support person who is at least 71 years old. At this time, visitors are not allowed in the infusion area.  Paclitaxel injection What is this medication? PACLITAXEL (PAK li TAX el) is a chemotherapy drug. It targets fast dividing cells, like cancer cells, and causes these cells to die. This medicine is used to treat ovarian cancer, breast cancer, lung cancer, Kaposi's sarcoma, and other cancers. This medicine may be used for other purposes; ask your  health care provider or pharmacist if you have questions. COMMON BRAND NAME(S): Onxol, Taxol What should I tell my care team before I take this medication? They need to know if you have any of these conditions: history of irregular heartbeat liver disease low blood counts, like low white cell, platelet, or red cell  counts lung or breathing disease, like asthma tingling of the fingers or toes, or other nerve disorder an unusual or allergic reaction to paclitaxel, alcohol, polyoxyethylated castor oil, other chemotherapy, other medicines, foods, dyes, or preservatives pregnant or trying to get pregnant breast-feeding How should I use this medication? This drug is given as an infusion into a vein. It is administered in a hospital or clinic by a specially trained health care professional. Talk to your pediatrician regarding the use of this medicine in children. Special care may be needed. Overdosage: If you think you have taken too much of this medicine contact a poison control center or emergency room at once. NOTE: This medicine is only for you. Do not share this medicine with others. What if I miss a dose? It is important not to miss your dose. Call your doctor or health care professional if you are unable to keep an appointment. What may interact with this medication? Do not take this medicine with any of the following medications: live virus vaccines This medicine may also interact with the following medications: antiviral medicines for hepatitis, HIV or AIDS certain antibiotics like erythromycin and clarithromycin certain medicines for fungal infections like ketoconazole and itraconazole certain medicines for seizures like carbamazepine, phenobarbital, phenytoin gemfibrozil nefazodone rifampin St. John's wort This list may not describe all possible interactions. Give your health care provider a list of all the medicines, herbs, non-prescription drugs, or dietary supplements you use. Also tell them if you smoke, drink alcohol, or use illegal drugs. Some items may interact with your medicine. What should I watch for while using this medication? Your condition will be monitored carefully while you are receiving this medicine. You will need important blood work done while you are taking this  medicine. This medicine can cause serious allergic reactions. To reduce your risk you will need to take other medicine(s) before treatment with this medicine. If you experience allergic reactions like skin rash, itching or hives, swelling of the face, lips, or tongue, tell your doctor or health care professional right away. In some cases, you may be given additional medicines to help with side effects. Follow all directions for their use. This drug may make you feel generally unwell. This is not uncommon, as chemotherapy can affect healthy cells as well as cancer cells. Report any side effects. Continue your course of treatment even though you feel ill unless your doctor tells you to stop. Call your doctor or health care professional for advice if you get a fever, chills or sore throat, or other symptoms of a cold or flu. Do not treat yourself. This drug decreases your body's ability to fight infections. Try to avoid being around people who are sick. This medicine may increase your risk to bruise or bleed. Call your doctor or health care professional if you notice any unusual bleeding. Be careful brushing and flossing your teeth or using a toothpick because you may get an infection or bleed more easily. If you have any dental work done, tell your dentist you are receiving this medicine. Avoid taking products that contain aspirin, acetaminophen, ibuprofen, naproxen, or ketoprofen unless instructed by your doctor. These medicines may hide a fever.  Do not become pregnant while taking this medicine. Women should inform their doctor if they wish to become pregnant or think they might be pregnant. There is a potential for serious side effects to an unborn child. Talk to your health care professional or pharmacist for more information. Do not breast-feed an infant while taking this medicine. Men are advised not to father a child while receiving this medicine. This product may contain alcohol. Ask your pharmacist  or healthcare provider if this medicine contains alcohol. Be sure to tell all healthcare providers you are taking this medicine. Certain medicines, like metronidazole and disulfiram, can cause an unpleasant reaction when taken with alcohol. The reaction includes flushing, headache, nausea, vomiting, sweating, and increased thirst. The reaction can last from 30 minutes to several hours. What side effects may I notice from receiving this medication? Side effects that you should report to your doctor or health care professional as soon as possible: allergic reactions like skin rash, itching or hives, swelling of the face, lips, or tongue breathing problems changes in vision fast, irregular heartbeat high or low blood pressure mouth sores pain, tingling, numbness in the hands or feet signs of decreased platelets or bleeding - bruising, pinpoint red spots on the skin, black, tarry stools, blood in the urine signs of decreased red blood cells - unusually weak or tired, feeling faint or lightheaded, falls signs of infection - fever or chills, cough, sore throat, pain or difficulty passing urine signs and symptoms of liver injury like dark yellow or brown urine; general ill feeling or flu-like symptoms; light-colored stools; loss of appetite; nausea; right upper belly pain; unusually weak or tired; yellowing of the eyes or skin swelling of the ankles, feet, hands unusually slow heartbeat Side effects that usually do not require medical attention (report to your doctor or health care professional if they continue or are bothersome): diarrhea hair loss loss of appetite muscle or joint pain nausea, vomiting pain, redness, or irritation at site where injected tiredness This list may not describe all possible side effects. Call your doctor for medical advice about side effects. You may report side effects to FDA at 1-800-FDA-1088. Where should I keep my medication? This drug is given in a hospital or  clinic and will not be stored at home. NOTE: This sheet is a summary. It may not cover all possible information. If you have questions about this medicine, talk to your doctor, pharmacist, or health care provider.  2023 Elsevier/Gold Standard (2021-11-06 00:00:00)

## 2022-07-13 NOTE — Progress Notes (Signed)
Per NP ok to infuse K20 over 1hr through port.

## 2022-07-14 NOTE — Progress Notes (Deleted)
Name: Morgan Peterson   MRN: [id number]    DOB: [birth year]   Date:07/14/2022       Progress Note  Subjective  Chief Complaint  Follow Up  HPI  Hives: back in Oct 2020 she developed hives after eating , it happened two more times and she came in to see me. She had a lab test that showed a lot of food allergies, she saw allergies and patch test was negative. She has been avoiding gluten, milk , egg white, peanut , shrimp, scallop, sesame seed .  She is still following the diet, too afraid to having hives again. She misses eating egg whites , but happy to not have another outbreak. She still has an Epipen .    HTN: She has been taking medication as prescribed Atenolol and Triamterene HCTZ,  denies chest pain or dizziness  but she has palpitation when tired or stressed out  Allergic to ACE - angioedema. BP has been at goal, she checks three times a week and has been in the 118-120's/60-70's.    DM: she is seeing  Endocrinologist Dr. Honor Junes , last A1C was at goal at 6.6% .  Fsbs has been well controlled, fasting between 70-110 .She has associated dyslipidemia and obesity. She. Denies polyphagia, polydipsia or polyuria . Last LDL was above 70, but she is now taking Crestor 10 mg instead of 5 mg and is tolerating it well, we will recheck  labs today    Migraine headaches: she was seen by Dr. Manuella Ghazi, had labs done in May 22, and MRI was unremarkable. 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 She states her bowel movements are better since she has been eating oatmeal and more fiber in her diet, no loner taking Miralax, she is taking Curerelle otc and drinking more water    Thyroid nodule: monitored by Dr. Manfred Shirts, last TSH normal , continue regular follow up   Carotid atherosclerosis: found on Cspine x-ray, also on exam today she has a pulstaing mass on right side of neck    Intermittent low back pain: dull aching, sometimes burning, no radiculitis, she stopped baclofen now  and sees Dr. Rock Nephew chiropractor prn  and is stable    Patient Active Problem List   Diagnosis Date Noted   Carcinoma of upper-outer quadrant of right breast in female, estrogen receptor negative ([place]) 05/26/2022   Breast cancer ([place]) 05/10/2022   Goals of care, counseling/discussion 05/10/2022   Encounter for monitoring cardiotoxic drug therapy 05/10/2022   Carotid stenosis 04/21/2022   Hypertension associated with type 2 diabetes mellitus ([place]) 01/20/2021   Chronic low back pain without sciatica 08/18/2017   Irritable bowel syndrome with constipation 05/02/2017   Vitamin D deficiency 09/09/2016   Hiatal hernia 06/02/2016   Allergic rhinitis, seasonal 06/02/2016   Obesity (BMI 30-39.9) 04/30/2016   Angio-edema 04/30/2016   Hyperlipidemia 11/04/2015   Hyperlipidemia due to type 2 diabetes mellitus ([place]) 11/04/2015   Cervical radiculopathy at C6 07/31/2015   Neuroma digital nerve 07/31/2015   Type 2 diabetes mellitus with hyperglycemia, with long-term current use of insulin ([place]) 06/30/2015   Cervical disc disease 06/30/2015   Essential hypertension 06/30/2015   History of breast cancer in female 03/23/2009    Past Surgical History:  Procedure Laterality Date   ABDOMINAL EXPLORATION SURGERY  2000   ovarian cyst   BREAST BIOPSY Right  2010   +    BREAST EXCISIONAL BIOPSY Right 2010   + mammo site inasive mammo ca   BREAST LUMPECTOMY Right 2010   [facility]   COLONOSCOPY  07/19/2012   Normal exam, Dr. Bary Castilla   COLONOSCOPY WITH PROPOFOL N/A 04/21/2022   Procedure: COLONOSCOPY WITH PROPOFOL;  Surgeon: Robert Bellow, MD;  Location: [facility] ENDOSCOPY;  Service: Endoscopy;  Laterality: N/A;   MASTECTOMY Right 2010   partial/ lumpectomy   PORTACATH PLACEMENT Left 05/19/2022   Procedure: INSERTION PORT-A-CATH;  Surgeon: Robert Bellow, MD;  Location: [facility] ORS;  Service: General;   Laterality: Left;   SIMPLE MASTECTOMY WITH AXILLARY SENTINEL NODE BIOPSY Right 04/26/2022   Procedure: SIMPLE MASTECTOMY WITH AXILLARY SENTINEL NODE BIOPSY;  Surgeon: Robert Bellow, MD;  Location: [facility] ORS;  Service: General;  Laterality: Right;  RNFA to assist   TUBAL LIGATION  20 years ago    Family History  Problem Relation Age of Onset   Osteoporosis Mother    Diabetes Father    Kidney disease Father    Diabetes Brother    Breast cancer Maternal Aunt    Bladder Cancer Maternal Aunt    Cervical cancer Maternal Aunt    Colon cancer Maternal Uncle    Lung cancer Maternal Uncle     Social History   Tobacco Use   Smoking status: Former    Packs/day: 0.25    Years: 10.00    Total pack years: 2.50    Types: Cigarettes    Start date: 29    Quit date: 1988    Years since quitting: 35.5   Smokeless tobacco: Never   Tobacco comments:    smoking cessation materials not required  Substance Use Topics   Alcohol use: Yes    Comment: wine occ     Current Outpatient Medications:    aspirin EC 81 MG tablet, Take 1 tablet (81 mg total) by mouth daily., Disp: 30 tablet, Rfl: 0   atenolol (TENORMIN) 25 MG tablet, Take 1 tablet (25 mg total) by mouth daily. (Patient taking differently: Take 25 mg by mouth every morning.), Disp: 90 tablet, Rfl: 1   azelastine (ASTELIN) 0.1 % nasal spray, Place 2 sprays into both nostrils daily as needed for allergies or rhinitis. Use in each nostril as directed, Disp: , Rfl:    betamethasone, augmented, (DIPROLENE) 0.05 % lotion, Apply 1 application topically 2 (two) times daily., Disp: , Rfl:    Blood Glucose Monitoring Suppl (ONETOUCH VERIO) w/Device KIT, 1 Device by Does not apply route once., Disp: 1 kit, Rfl: 0   cholecalciferol (VITAMIN D) 1000 UNITS tablet, Take 1,000 Units by mouth daily., Disp: , Rfl:    Cyanocobalamin (VITAMIN B-12 PO), Take 1 tablet by mouth daily as needed (fatigue)., Disp: , Rfl:    EPINEPHrine (EPIPEN 2-PAK) 0.3 mg/0.3  mL IJ SOAJ injection, Inject 0.3 mg into the muscle as needed for anaphylaxis., Disp: 2 each, Rfl: 0   famotidine (PEPCID) 20 MG tablet, Take 20 mg by mouth 2 (two) times daily., Disp: , Rfl:    glucose blood (ONETOUCH VERIO) test strip, Use as instructed, Disp: 100 each, Rfl: 12   Lancets (ONETOUCH ULTRASOFT) lancets, Use as instructed, Disp: 100 each, Rfl: 3   levocetirizine (XYZAL) 5 MG tablet, Take 1 tablet (5 mg total) by mouth every evening. (Patient taking differently: Take 5 mg by mouth daily as needed for allergies.), Disp: 90 tablet, Rfl: 1   lidocaine-prilocaine (EMLA) cream, Apply 1 application.  topically as needed. Apply to port and cover with saran wrap 1-2 hours prior to port access, Disp: 30 g, Rfl: 1   montelukast (SINGULAIR) 10 MG tablet, Take 1 tablet (10 mg total) by mouth at bedtime. (Patient taking differently: Take 10 mg by mouth at bedtime as needed (allergies).), Disp: 90 tablet, Rfl: 1   Multiple Vitamin (MULTIVITAMIN WITH MINERALS) TABS tablet, Take 1 tablet by mouth 2 (two) times a week., Disp: , Rfl:    ondansetron (ZOFRAN) 8 MG tablet, Take 1 tablet (8 mg total) by mouth every 8 (eight) hours as needed for nausea or vomiting., Disp: 40 tablet, Rfl: 1   OZEMPIC, 0.25 OR 0.5 MG/DOSE, 2 MG/1.5ML SOPN, Inject 0.5 mg into the skin every Sunday., Disp: , Rfl:    potassium chloride (KLOR-CON M) 10 MEQ tablet, Take 1 tablet (10 mEq total) by mouth daily., Disp: 30 tablet, Rfl: 6   prochlorperazine (COMPAZINE) 10 MG tablet, Take 1 tablet (10 mg total) by mouth every 6 (six) hours as needed for nausea or vomiting., Disp: 40 tablet, Rfl: 1   rizatriptan (MAXALT-MLT) 10 MG disintegrating tablet, Take 5-10 mg by mouth as needed for migraine. May repeat in 2 hours if needed, Disp: , Rfl:    rosuvastatin (CRESTOR) 10 MG tablet, Take 1 tablet (10 mg total) by mouth every morning., Disp: 90 tablet, Rfl: 1   triamterene-hydrochlorothiazide (MAXZIDE-25) 37.5-25 MG tablet, TAKE 1 TABLET BY  MOUTH EVERY DAY (Patient taking differently: Take 1 tablet by mouth every morning.), Disp: 90 tablet, Rfl: 0  Allergies  Allergen Reactions   Levaquin [Levofloxacin In D5w] Swelling    Other reaction(s): Arthralgia (Joint Pain)   Losartan     Other reaction(s): Angioedema   Metoprolol Swelling    Lip and tongue tingling and numbness   Amlodipine-Olmesartan    Canagliflozin Other (See Comments)    Bladder pain   Egg White [Egg White (Egg Protein)]     Unknown reaction   Gabapentin     cns side effects.   Glipizide     Other reaction(s): Abdominal pain   Gluten Meal    Lisinopril Swelling    Angioedema   Metformin And Related Nausea Only   Milk (Cow)    Milk-Related Compounds    Onglyza [Saxagliptin] Other (See Comments)    Increased PVC's   Shrimp Extract Allergy Skin Test     Other reaction(s): Other (See Comments)   Tramadol Nausea And Vomiting   Actos [Pioglitazone] Other (See Comments) and Nausea And Vomiting    Bladder pain   Atorvastatin Other (See Comments)    Other reaction(s): Abdominal Pain, Other (See Comments)   Carvedilol Other (See Comments)    NUMBNESS, TINGLING, ACHING IN ARMS   Prednisone Palpitations    I personally reviewed active problem list, medication list, allergies, family history, social history, health maintenance with the patient/caregiver today.   ROS  ***  Objective  There were no vitals filed for this visit.  There is no height or weight on file to calculate BMI.  Physical Exam ***   Diabetic Foot Exam: Diabetic Foot Exam - Simple   No data filed    ***  PHQ2/9:    01/11/2022    3:05 PM 12/31/2021    9:16 AM 12/24/2021   11:43 AM 08/12/2021    9:00 AM 05/19/2021    9:44 AM  Depression screen PHQ 2/9  Decreased Interest 0 0 0 0 0  Down, Depressed, Hopeless 0 0 0 0  0  PHQ - 2 Score 0 0 0 0 0  Altered sleeping  0   0  Tired, decreased energy  0   0  Change in appetite  0   0  Feeling bad or failure about yourself   0    0  Trouble concentrating  0   0  Moving slowly or fidgety/restless  0   0  Suicidal thoughts  0   0  PHQ-9 Score  0   0  Difficult doing work/chores     Not difficult at all    phq 9 is {gen pos [id number]}   Fall Risk:    01/11/2022    3:05 PM 12/31/2021    9:16 AM 12/24/2021   11:45 AM 08/12/2021    9:00 AM 05/19/2021    9:43 AM  Fall Risk   Falls in the past year? 0 0 0 0 0  Number falls in past yr: 0 0 0 0 0  Injury with Fall? 0 0 0 0 0  Risk for fall due to :  No Fall Risks No Fall Risks No Fall Risks   Follow up Falls evaluation completed Falls prevention discussed Falls prevention discussed Falls prevention discussed       Functional Status Survey:      Assessment & Plan  *** There are no diagnoses linked to this encounter.

## 2022-07-15 ENCOUNTER — Ambulatory Visit: Payer: Medicare Other | Admitting: Family Medicine

## 2022-07-15 DIAGNOSIS — E1169 Type 2 diabetes mellitus with other specified complication: Secondary | ICD-10-CM

## 2022-07-20 ENCOUNTER — Inpatient Hospital Stay: Payer: Medicare Other | Attending: Oncology

## 2022-07-20 ENCOUNTER — Other Ambulatory Visit: Payer: Self-pay | Admitting: Internal Medicine

## 2022-07-20 ENCOUNTER — Ambulatory Visit: Payer: Medicare Other

## 2022-07-20 ENCOUNTER — Inpatient Hospital Stay: Payer: Medicare Other

## 2022-07-20 VITALS — BP 130/70 | HR 81 | Temp 97.9°F | Resp 18 | Wt 195.5 lb

## 2022-07-20 DIAGNOSIS — Z171 Estrogen receptor negative status [ER-]: Secondary | ICD-10-CM | POA: Diagnosis not present

## 2022-07-20 DIAGNOSIS — Z5189 Encounter for other specified aftercare: Secondary | ICD-10-CM | POA: Diagnosis not present

## 2022-07-20 DIAGNOSIS — Z5111 Encounter for antineoplastic chemotherapy: Secondary | ICD-10-CM | POA: Insufficient documentation

## 2022-07-20 DIAGNOSIS — Z79899 Other long term (current) drug therapy: Secondary | ICD-10-CM | POA: Diagnosis not present

## 2022-07-20 DIAGNOSIS — C50811 Malignant neoplasm of overlapping sites of right female breast: Secondary | ICD-10-CM | POA: Diagnosis present

## 2022-07-20 DIAGNOSIS — Z87891 Personal history of nicotine dependence: Secondary | ICD-10-CM | POA: Insufficient documentation

## 2022-07-20 DIAGNOSIS — Z7982 Long term (current) use of aspirin: Secondary | ICD-10-CM | POA: Diagnosis not present

## 2022-07-20 DIAGNOSIS — C50411 Malignant neoplasm of upper-outer quadrant of right female breast: Secondary | ICD-10-CM

## 2022-07-20 DIAGNOSIS — Z9011 Acquired absence of right breast and nipple: Secondary | ICD-10-CM | POA: Insufficient documentation

## 2022-07-20 DIAGNOSIS — Z91012 Allergy to eggs: Secondary | ICD-10-CM | POA: Insufficient documentation

## 2022-07-20 DIAGNOSIS — G620 Drug-induced polyneuropathy: Secondary | ICD-10-CM | POA: Insufficient documentation

## 2022-07-20 DIAGNOSIS — T451X5A Adverse effect of antineoplastic and immunosuppressive drugs, initial encounter: Secondary | ICD-10-CM | POA: Diagnosis not present

## 2022-07-20 DIAGNOSIS — Z923 Personal history of irradiation: Secondary | ICD-10-CM | POA: Diagnosis not present

## 2022-07-20 DIAGNOSIS — Z5112 Encounter for antineoplastic immunotherapy: Secondary | ICD-10-CM | POA: Insufficient documentation

## 2022-07-20 LAB — COMPREHENSIVE METABOLIC PANEL
ALT: 23 U/L (ref 0–44)
AST: 24 U/L (ref 15–41)
Albumin: 3.6 g/dL (ref 3.5–5.0)
Alkaline Phosphatase: 83 U/L (ref 38–126)
Anion gap: 8 (ref 5–15)
BUN: 12 mg/dL (ref 8–23)
CO2: 27 mmol/L (ref 22–32)
Calcium: 8.7 mg/dL — ABNORMAL LOW (ref 8.9–10.3)
Chloride: 105 mmol/L (ref 98–111)
Creatinine, Ser: 0.72 mg/dL (ref 0.44–1.00)
GFR, Estimated: >60 mL/min (ref >60)
Glucose, Bld: 148 mg/dL — ABNORMAL HIGH (ref 70–99)
Potassium: 3.3 mmol/L — ABNORMAL LOW (ref 3.5–5.1)
Sodium: 140 mmol/L (ref 135–145)
Total Bilirubin: 0.5 mg/dL (ref 0.3–1.2)
Total Protein: 6.6 g/dL (ref 6.5–8.1)

## 2022-07-20 LAB — CBC WITH DIFFERENTIAL/PLATELET
Abs Immature Granulocytes: 0.03 10*3/uL (ref 0.00–0.07)
Basophils Absolute: 0 10*3/uL (ref 0.0–0.1)
Basophils Relative: 1 %
Eosinophils Absolute: 0 10*3/uL (ref 0.0–0.5)
Eosinophils Relative: 0 %
HCT: 31 % — ABNORMAL LOW (ref 36.0–46.0)
Hemoglobin: 9.9 g/dL — ABNORMAL LOW (ref 12.0–15.0)
Immature Granulocytes: 1 %
Lymphocytes Relative: 61 %
Lymphs Abs: 1.9 10*3/uL (ref 0.7–4.0)
MCH: 28.4 pg (ref 26.0–34.0)
MCHC: 31.9 g/dL (ref 30.0–36.0)
MCV: 89.1 fL (ref 80.0–100.0)
Monocytes Absolute: 0.2 10*3/uL (ref 0.1–1.0)
Monocytes Relative: 7 %
Neutro Abs: 0.9 10*3/uL — ABNORMAL LOW (ref 1.7–7.7)
Neutrophils Relative %: 30 %
Platelets: 297 10*3/uL (ref 150–400)
RBC: 3.48 MIL/uL — ABNORMAL LOW (ref 3.87–5.11)
RDW: 15 % (ref 11.5–15.5)
WBC: 3.1 10*3/uL — ABNORMAL LOW (ref 4.0–10.5)
nRBC: 2.3 % — ABNORMAL HIGH (ref 0.0–0.2)

## 2022-07-20 MED ORDER — SODIUM CHLORIDE 0.9 % IV SOLN
Freq: Once | INTRAVENOUS | Status: AC
  Filled 2022-07-20: qty 250

## 2022-07-20 MED ORDER — SODIUM CHLORIDE 0.9 % IV SOLN
80.0000 mg/m2 | Freq: Once | INTRAVENOUS | Status: AC
  Administered 2022-07-20: 162 mg via INTRAVENOUS
  Filled 2022-07-20: qty 27

## 2022-07-20 MED ORDER — HEPARIN SOD (PORK) LOCK FLUSH 100 UNIT/ML IV SOLN
500.0000 [IU] | Freq: Once | INTRAVENOUS | Status: AC | PRN
  Filled 2022-07-20: qty 5

## 2022-07-20 MED ORDER — SODIUM CHLORIDE 0.9 % IV SOLN
10.0000 mg | Freq: Once | INTRAVENOUS | Status: AC
  Administered 2022-07-20: 10 mg via INTRAVENOUS
  Filled 2022-07-20: qty 1

## 2022-07-20 MED ORDER — DIPHENHYDRAMINE HCL 50 MG/ML IJ SOLN
50.0000 mg | Freq: Once | INTRAMUSCULAR | Status: AC
  Administered 2022-07-20: 50 mg via INTRAVENOUS
  Filled 2022-07-20: qty 1

## 2022-07-20 MED ORDER — FAMOTIDINE IN NACL 20-0.9 MG/50ML-% IV SOLN
20.0000 mg | Freq: Once | INTRAVENOUS | Status: AC
  Administered 2022-07-20: 20 mg via INTRAVENOUS
  Filled 2022-07-20: qty 50

## 2022-07-20 MED ORDER — HEPARIN SOD (PORK) LOCK FLUSH 100 UNIT/ML IV SOLN
INTRAVENOUS | Status: AC
  Administered 2022-07-20: 500 [IU]
  Filled 2022-07-20: qty 5

## 2022-07-20 NOTE — Progress Notes (Signed)
0910: ANC 0.9, Per Dr. Rogue Bussing okay to proceed with Taxol treatment as scheduled. Pt to receive Zarxio 07/21/22, 07/22/22, and 07/23/22.  Per pt request 07/23/22 appt changed to 11am, per Elmon Else [facility] okay to give Zarxio at 11am 07/23/22. 07/21/22 and 07/22/22 appt remains at 2p.

## 2022-07-20 NOTE — Patient Instructions (Signed)
[facility]  Discharge Instructions: Thank you for choosing [facility] to provide your oncology and hematology care.  If you have a lab appointment with the Cancer Center, please go directly to the Cancer Center and check in at the registration area.  Wear comfortable clothing and clothing appropriate for easy access to any Portacath or PICC line.   We strive to give you quality time with your provider. You may need to reschedule your appointment if you arrive late (15 or more minutes).  Arriving late affects you and other patients whose appointments are after yours.  Also, if you miss three or more appointments without notifying the office, you may be dismissed from the clinic at the provider's discretion.      For prescription refill requests, have your pharmacy contact our office and allow 72 hours for refills to be completed.    Today you received the following chemotherapy and/or immunotherapy agents Taxol       To help prevent nausea and vomiting after your treatment, we encourage you to take your nausea medication as directed.  BELOW ARE SYMPTOMS THAT SHOULD BE REPORTED IMMEDIATELY: *FEVER GREATER THAN 100.4 F (38 C) OR HIGHER *CHILLS OR SWEATING *NAUSEA AND VOMITING THAT IS NOT CONTROLLED WITH YOUR NAUSEA MEDICATION *UNUSUAL SHORTNESS OF BREATH *UNUSUAL BRUISING OR BLEEDING *URINARY PROBLEMS (pain or burning when urinating, or frequent urination) *BOWEL PROBLEMS (unusual diarrhea, constipation, pain near the anus) TENDERNESS IN MOUTH AND THROAT WITH OR WITHOUT PRESENCE OF ULCERS (sore throat, sores in mouth, or a toothache) UNUSUAL RASH, SWELLING OR PAIN  UNUSUAL VAGINAL DISCHARGE OR ITCHING   Items with * indicate a potential emergency and should be followed up as soon as possible or go to the Emergency Department if any problems should occur.  Please show the CHEMOTHERAPY ALERT CARD or IMMUNOTHERAPY ALERT CARD at check-in to the  Emergency Department and triage nurse.  Should you have questions after your visit or need to cancel or reschedule your appointment, please contact [facility]  [phone] and follow the prompts.  Office hours are 8:00 a.m. to 4:30 p.m. Monday - Friday. Please note that voicemails left after 4:00 p.m. may not be returned until the following business day.  We are closed weekends and major holidays. You have access to a nurse at all times for urgent questions. Please call the main number to the clinic [phone] and follow the prompts.  For any non-urgent questions, you may also contact your provider using MyChart. We now offer e-Visits for anyone 18 and older to request care online for non-urgent symptoms. For details visit [email or web address].   Also download the MyChart app! Go to the app store, search "MyChart", open the app, select [facility], and log in with your MyChart username and password.  Masks are optional in the cancer centers. If you would like for your care team to wear a mask while they are taking care of you, please let them know. For doctor visits, patients may have with them one support person who is at least 71 years old. At this time, visitors are not allowed in the infusion area.   

## 2022-07-21 ENCOUNTER — Inpatient Hospital Stay: Payer: Medicare Other

## 2022-07-21 DIAGNOSIS — Z171 Estrogen receptor negative status [ER-]: Secondary | ICD-10-CM

## 2022-07-21 DIAGNOSIS — Z5112 Encounter for antineoplastic immunotherapy: Secondary | ICD-10-CM | POA: Diagnosis not present

## 2022-07-21 MED ORDER — FILGRASTIM-SNDZ 480 MCG/0.8ML IJ SOSY
480.0000 ug | PREFILLED_SYRINGE | Freq: Once | INTRAMUSCULAR | Status: AC
  Administered 2022-07-21: 480 ug via SUBCUTANEOUS
  Filled 2022-07-21: qty 0.8

## 2022-07-22 ENCOUNTER — Inpatient Hospital Stay: Payer: Medicare Other

## 2022-07-22 DIAGNOSIS — C50811 Malignant neoplasm of overlapping sites of right female breast: Secondary | ICD-10-CM

## 2022-07-22 DIAGNOSIS — Z5112 Encounter for antineoplastic immunotherapy: Secondary | ICD-10-CM | POA: Diagnosis not present

## 2022-07-22 MED ORDER — FILGRASTIM-SNDZ 480 MCG/0.8ML IJ SOSY
480.0000 ug | PREFILLED_SYRINGE | Freq: Once | INTRAMUSCULAR | Status: AC
  Administered 2022-07-22: 480 ug via SUBCUTANEOUS
  Filled 2022-07-22: qty 0.8

## 2022-07-23 ENCOUNTER — Inpatient Hospital Stay: Payer: Medicare Other

## 2022-07-23 DIAGNOSIS — Z5112 Encounter for antineoplastic immunotherapy: Secondary | ICD-10-CM | POA: Diagnosis not present

## 2022-07-23 DIAGNOSIS — Z171 Estrogen receptor negative status [ER-]: Secondary | ICD-10-CM

## 2022-07-23 MED ORDER — FILGRASTIM-SNDZ 480 MCG/0.8ML IJ SOSY
480.0000 ug | PREFILLED_SYRINGE | Freq: Once | INTRAMUSCULAR | Status: AC
  Administered 2022-07-23: 480 ug via SUBCUTANEOUS
  Filled 2022-07-23: qty 0.8

## 2022-07-26 ENCOUNTER — Encounter: Payer: Self-pay | Admitting: Internal Medicine

## 2022-07-26 ENCOUNTER — Inpatient Hospital Stay (HOSPITAL_BASED_OUTPATIENT_CLINIC_OR_DEPARTMENT_OTHER): Payer: Medicare Other | Admitting: Internal Medicine

## 2022-07-26 ENCOUNTER — Inpatient Hospital Stay: Payer: Medicare Other

## 2022-07-26 DIAGNOSIS — Z171 Estrogen receptor negative status [ER-]: Secondary | ICD-10-CM | POA: Diagnosis not present

## 2022-07-26 DIAGNOSIS — Z95828 Presence of other vascular implants and grafts: Secondary | ICD-10-CM

## 2022-07-26 DIAGNOSIS — C50411 Malignant neoplasm of upper-outer quadrant of right female breast: Secondary | ICD-10-CM

## 2022-07-26 DIAGNOSIS — Z5112 Encounter for antineoplastic immunotherapy: Secondary | ICD-10-CM | POA: Diagnosis not present

## 2022-07-26 LAB — COMPREHENSIVE METABOLIC PANEL
ALT: 16 U/L (ref 0–44)
AST: 22 U/L (ref 15–41)
Albumin: 3.5 g/dL (ref 3.5–5.0)
Alkaline Phosphatase: 93 U/L (ref 38–126)
Anion gap: 9 (ref 5–15)
BUN: 12 mg/dL (ref 8–23)
CO2: 26 mmol/L (ref 22–32)
Calcium: 8.5 mg/dL — ABNORMAL LOW (ref 8.9–10.3)
Chloride: 104 mmol/L (ref 98–111)
Creatinine, Ser: 0.85 mg/dL (ref 0.44–1.00)
GFR, Estimated: >60 mL/min (ref >60)
Glucose, Bld: 164 mg/dL — ABNORMAL HIGH (ref 70–99)
Potassium: 3 mmol/L — ABNORMAL LOW (ref 3.5–5.1)
Sodium: 139 mmol/L (ref 135–145)
Total Bilirubin: 0.3 mg/dL (ref 0.3–1.2)
Total Protein: 6.5 g/dL (ref 6.5–8.1)

## 2022-07-26 LAB — CBC WITH DIFFERENTIAL/PLATELET
Abs Immature Granulocytes: 0.93 10*3/uL — ABNORMAL HIGH (ref 0.00–0.07)
Basophils Absolute: 0 10*3/uL (ref 0.0–0.1)
Basophils Relative: 0 %
Eosinophils Absolute: 0 10*3/uL (ref 0.0–0.5)
Eosinophils Relative: 0 %
HCT: 30.9 % — ABNORMAL LOW (ref 36.0–46.0)
Hemoglobin: 9.7 g/dL — ABNORMAL LOW (ref 12.0–15.0)
Immature Granulocytes: 10 %
Lymphocytes Relative: 25 %
Lymphs Abs: 2.4 10*3/uL (ref 0.7–4.0)
MCH: 28.3 pg (ref 26.0–34.0)
MCHC: 31.4 g/dL (ref 30.0–36.0)
MCV: 90.1 fL (ref 80.0–100.0)
Monocytes Absolute: 1 10*3/uL (ref 0.1–1.0)
Monocytes Relative: 10 %
Neutro Abs: 5.3 10*3/uL (ref 1.7–7.7)
Neutrophils Relative %: 55 %
Platelets: 285 10*3/uL (ref 150–400)
RBC: 3.43 MIL/uL — ABNORMAL LOW (ref 3.87–5.11)
RDW: 16 % — ABNORMAL HIGH (ref 11.5–15.5)
Smear Review: NORMAL
WBC: 9.6 10*3/uL (ref 4.0–10.5)
nRBC: 1 % — ABNORMAL HIGH (ref 0.0–0.2)

## 2022-07-26 MED ORDER — SODIUM CHLORIDE 0.9% FLUSH
10.0000 mL | INTRAVENOUS | Status: DC | PRN
  Administered 2022-07-26: 10 mL via INTRAVENOUS
  Filled 2022-07-26: qty 10

## 2022-07-26 MED ORDER — HEPARIN SOD (PORK) LOCK FLUSH 100 UNIT/ML IV SOLN
500.0000 [IU] | Freq: Once | INTRAVENOUS | Status: AC
  Administered 2022-07-26: 500 [IU] via INTRAVENOUS
  Filled 2022-07-26: qty 5

## 2022-07-26 MED FILL — Dexamethasone Sodium Phosphate Inj 100 MG/10ML: INTRAMUSCULAR | Qty: 1 | Status: AC

## 2022-07-26 MED FILL — Fosaprepitant Dimeglumine For IV Infusion 150 MG (Base Eq): INTRAVENOUS | Qty: 5 | Status: AC

## 2022-07-26 NOTE — Assessment & Plan Note (Addendum)
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  Patient currently on adjuvant chemoimmunotherapy [YCXKGYJ 856 study]  # Proceed with cycle # 3 day 1 tomorrow CarboTaxol Keytruda.  We will dose reduce carboplatin to AUC 4 -given recent neutropenia. JUNE 2023-  TSH-WNL.  June 6th- ECHO- 05/25/2022- 65-70%.   #Peripheral neuropathy-grade 1; from Taxol.  Monitor for now.  #Genetic counseling: Pending  # DISPOSITION: # Please add Iron studies/ferritin to labs today  # as planned chemo- carbo-taxol-keytruda  # 8/15- labs- cbc/cmp; taxol  # 8/22-labs-cbc/cmp;taxol   # Zarxio-8/23; 8/24; 8/25   # Follow up MD- in 3 weeks-;labs- cbc/cmp; D-2 Adriamycin-Cytoxan--keytruda- Dr.B

## 2022-07-26 NOTE — Progress Notes (Signed)
[facility] CONSULT NOTE  Patient Care Team: Sowles, [name], MD as PCP - General (Family Medicine) [name], Jeffrey W, MD (General Surgery) Vaught, Creighton, MD as Referring Physician (Otolaryngology) Callwood, Dwayne D, MD as Consulting Physician (Cardiology) Benitez-Graham, Ana, MD as Referring Physician (Dermatology) O'Connell, Thomas L, MD as Consulting Physician (Internal Medicine) ,  R, MD as Consulting Physician (Internal Medicine)  CHIEF COMPLAINTS/PURPOSE OF CONSULTATION: Breast cancer  #  Oncology History Overview Note  She has a remote history of right breast cancer, T1bN0, ER positive,s/p lumpectomy and MammoSite radiation and 5 years of Arimidex.    02/19/22 screening mammogram bilaterally showed  1. Suspicious right breast mass at the 10 o'clock position 8 cm from the nipple. Recommend ultrasound-guided biopsy. 2. Indeterminate right breast mass at the 6 o'clock position 4 cm from the nipple. Recommend ultrasound-guided biopsy. 3. No suspicious right axillary lymphadenopathy.   04/12/2022 Unilateral right breast diagnostic mammogram showed 1. Suspicious right breast mass at the 10 o'clock position 8 cm from the nipple. Recommend ultrasound-guided biopsy. 2. Indeterminate right breast mass at the 6 o'clock position 4 cm from the nipple. Recommend ultrasound-guided biopsy. 3. No suspicious right axillary lymphadenopathy.   04/13/2022 right breast mass 6 o'clock position 1 cm from the nipple showed invasive mammary carcinoma, no special type. Grade 3, DCIS present high grade, LVI not identified. ER-, PR 1-10%, HER 2 -   04/13/2022, right breast mass 10:00 7 cm from nipple biopsy showed invasive mammary carcinoma, grade 3, high-grade DCIS with focal comedonecrosis, lymphovascular invasion not identified.  ER -, PR 1 to 10%, HER2-    04/26/2022, right breast mastectomy with axillary dissection Invasive mammary carcinoma, no special type, multifocal, DCIS  high-grade, benign nipple/areola.  2 deposits of invasive mammary carcinoma, no definite residual lymph node identified.   Breast cancer (HCC)  05/10/2022 Initial Diagnosis   Breast cancer (HCC)   05/10/2022 Cancer Staging   Staging form: Breast, AJCC 8th Edition - Pathologic stage from 05/10/2022: Stage IIA (pT1c, pN1a, cM0, G3, ER-, PR+, HER2-) - Signed by Yu, Zhou, MD on 05/10/2022 Stage prefix: Initial diagnosis Histologic grading system: 3 grade system   05/24/2022 - 05/24/2022 Chemotherapy   Patient is on Treatment Plan : BREAST Pembrolizumab (200) D1 + Carboplatin (5) D1 + Paclitaxel (80) D1,8,15 q21d X 4 cycles / Pembrolizumab (200) D1 + AC D1 q21d x 4 cycles     05/27/2022 -  Chemotherapy   Patient is on Treatment Plan : BREAST Pembrolizumab (200) D1 + Carboplatin (5) D1 + Paclitaxel (80) D1,8,15 q21d X 4 cycles / Pembrolizumab (200) D1 + AC D1 q21d x 4 cycles     Carcinoma of upper-outer quadrant of right breast in female, estrogen receptor negative (HCC)  05/26/2022 Initial Diagnosis   Carcinoma of upper-outer quadrant of right breast in female, estrogen receptor negative (HCC)   05/26/2022 Cancer Staging   Staging form: Breast, AJCC 8th Edition - Pathologic: Stage IIA (pT1c, pN1, cM0, G3, ER-, PR-, HER2-) - Signed by ,  R, MD on 05/26/2022 Multigene prognostic tests performed: None Histologic grading system: 3 grade system     HISTORY OF PRESENTING ILLNESS: Ambulating independently.  Alone.  Morgan Peterson 70 y.o.  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  Patient admits to mild tingling and numbness extremities.  Denies any nausea vomiting.  Appetite is good.  No weight loss.  Review of Systems    Constitutional:  Negative for chills, diaphoresis, fever, malaise/fatigue and weight loss.  HENT:  Negative for nosebleeds and sore  throat.   Eyes:  Negative for double vision.  Respiratory:  Negative for cough, hemoptysis, sputum production, shortness of breath and wheezing.   Cardiovascular:  Negative for chest pain, palpitations, orthopnea and leg swelling.  Gastrointestinal:  Negative for abdominal pain, blood in stool, constipation, diarrhea, heartburn, melena, nausea and vomiting.  Genitourinary:  Negative for dysuria, frequency and urgency.  Musculoskeletal:  Negative for back pain and joint pain.  Skin: Negative.  Negative for itching and rash.  Neurological:  Negative for dizziness, tingling, focal weakness, weakness and headaches.  Endo/Heme/Allergies:  Does not bruise/bleed easily.  Psychiatric/Behavioral:  Negative for depression. The patient is not nervous/anxious and does not have insomnia.      MEDICAL HISTORY:  Past Medical History:  Diagnosis Date   Cervical radiculopathy    Hiatal hernia    Hypercholesterolemia    Hyperlipidemia    Hypertension    IBS (irritable bowel syndrome)    Malignant neoplasm of upper-outer quadrant of female breast (HCC) 04/11/2009   Right breast, invasive ductal carcinoma, 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 Arimidex therapy completed September 2015   Mild mitral valve prolapse    Personal history of malignant neoplasm of breast 2010   Personal history of radiation therapy 2010   mammosite   T2DM (type 2 diabetes mellitus) (HCC) 2011    SURGICAL HISTORY: Past Surgical History:  Procedure Laterality Date   ABDOMINAL EXPLORATION SURGERY  2000   ovarian cyst   BREAST BIOPSY Right 2010   +    BREAST EXCISIONAL BIOPSY Right 2010   + mammo site inasive mammo ca   BREAST LUMPECTOMY Right 2010   [facility]   COLONOSCOPY  07/19/2012   Normal exam, Dr. [name]   COLONOSCOPY WITH PROPOFOL N/A 04/21/2022   Procedure: COLONOSCOPY WITH PROPOFOL;  Surgeon: [name], Jeffrey W, MD;  Location: [facility] ENDOSCOPY;  Service:  Endoscopy;  Laterality: N/A;   MASTECTOMY Right 2010   partial/ lumpectomy   PORTACATH PLACEMENT Left 05/19/2022   Procedure: INSERTION PORT-A-CATH;  Surgeon: [name], Jeffrey W, MD;  Location: [facility] ORS;  Service: General;  Laterality: Left;   SIMPLE MASTECTOMY WITH AXILLARY SENTINEL NODE BIOPSY Right 04/26/2022   Procedure: SIMPLE MASTECTOMY WITH AXILLARY SENTINEL NODE BIOPSY;  Surgeon: [name], Jeffrey W, MD;  Location: [facility] ORS;  Service: General;  Laterality: Right;  RNFA to assist   TUBAL LIGATION  20 years ago    SOCIAL HISTORY: Social History   Socioeconomic History   Marital status: Married    Spouse name: Antonio   Number of children: 2   Years of education: Not on file   Highest education level: Not on file  Occupational History   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  No smoking.   Social Determinants of Health   Financial Resource Strain: Low Risk  (12/31/2021)   Overall Financial Resource Strain (CARDIA)    Difficulty of Paying Living Expenses: Not hard at all  Food Insecurity: No Food Insecurity (12/31/2021)   Hunger Vital Sign    Worried About Running Out of Food in the Last Year: Never true    Ran Out of Food in the Last Year: Never true  Transportation Needs: No Transportation Needs (12/31/2021)   PRAPARE - Transportation    Lack of Transportation  (Medical): No    Lack of Transportation (Non-Medical): No  Physical Activity: Inactive (12/31/2021)   Exercise Vital Sign    Days of Exercise per Week: 0 days    Minutes of Exercise per Session: 0 min  Stress: No Stress Concern Present (12/31/2021)   [employer] - Occupational Stress Questionnaire    Feeling of Stress : Only a little  Social Connections: Moderately Integrated (12/31/2021)   Social Connection and Isolation Panel [NHANES]    Frequency of Communication with Friends and Family: More than three times a week    Frequency of Social Gatherings with Friends and Family: Three times a week    Attends Religious Services: More than 4 times per year    Active Member of Clubs or Organizations: No    Attends Club or Organization Meetings: Never    Marital Status: Married  Intimate Partner Violence: Not At Risk (12/31/2021)   Humiliation, Afraid, Rape, and Kick questionnaire    Fear of Current or Ex-Partner: No    Emotionally Abused: No    Physically Abused: No    Sexually Abused: No    FAMILY HISTORY: Family History  Problem Relation Age of Onset   Osteoporosis Mother    Diabetes Father    Kidney disease Father    Diabetes Brother    Breast cancer Maternal Aunt    Bladder Cancer Maternal Aunt    Cervical cancer Maternal Aunt    Colon cancer Maternal Uncle    Lung cancer Maternal Uncle     ALLERGIES:  is allergic to levaquin [levofloxacin in d5w], losartan, metoprolol, amlodipine-olmesartan, canagliflozin, egg white [egg white (egg protein)], gabapentin, glipizide, gluten meal, lisinopril, metformin and related, milk (cow), milk-related compounds, onglyza [saxagliptin], shrimp extract allergy skin test, tramadol, actos [pioglitazone], atorvastatin, carvedilol, and prednisone.  MEDICATIONS:  Current Outpatient Medications  Medication Sig Dispense Refill   aspirin EC 81 MG tablet Take 1 tablet (81 mg total) by mouth daily. 30 tablet 0   atenolol  (TENORMIN) 25 MG tablet Take 1 tablet (25 mg total) by mouth daily. (Patient taking differently: Take 25 mg by mouth every morning.) 90 tablet 1   azelastine (ASTELIN) 0.1 % nasal spray Place 2 sprays into both nostrils daily as needed for allergies or rhinitis. Use in each nostril as directed     betamethasone, augmented, (DIPROLENE) 0.05 % lotion Apply 1 application topically 2 (two) times daily.     Blood Glucose Monitoring Suppl (ONETOUCH VERIO) w/Device KIT 1 Device by Does not apply route once. 1 kit 0   cholecalciferol (VITAMIN D) 1000 UNITS tablet Take 1,000 Units by mouth daily.     Cyanocobalamin (VITAMIN B-12 PO) Take 1 tablet by mouth daily as needed (fatigue).       EPINEPHrine (EPIPEN 2-PAK) 0.3 mg/0.3 mL IJ SOAJ injection Inject 0.3 mg into the muscle as needed for anaphylaxis. 2 each 0   famotidine (PEPCID) 20 MG tablet Take 20 mg by mouth 2 (two) times daily.     glucose blood (ONETOUCH VERIO) test strip Use as instructed 100 each 12   Lancets (ONETOUCH ULTRASOFT) lancets Use as instructed 100 each 3   levocetirizine (XYZAL) 5 MG tablet Take 1 tablet (5 mg total) by mouth every evening. (Patient taking differently: Take 5 mg by mouth daily as needed for allergies.) 90 tablet 1   lidocaine-prilocaine (EMLA) cream Apply 1 application. topically as needed. Apply to port and cover with saran wrap 1-2 hours prior to port access 30 g 1   montelukast (SINGULAIR) 10 MG tablet Take 1 tablet (10 mg total) by mouth at bedtime. (Patient taking differently: Take 10 mg by mouth at bedtime as needed (allergies).) 90 tablet 1   Multiple Vitamin (MULTIVITAMIN WITH MINERALS) TABS tablet Take 1 tablet by mouth 2 (two) times a week.     ondansetron (ZOFRAN) 8 MG tablet Take 1 tablet (8 mg total) by mouth every 8 (eight) hours as needed for nausea or vomiting. 40 tablet 1   OZEMPIC, 0.25 OR 0.5 MG/DOSE, 2 MG/1.5ML SOPN Inject 0.5 mg into the skin every Sunday.     potassium chloride (KLOR-CON M) 10 MEQ  tablet Take 1 tablet (10 mEq total) by mouth daily. 30 tablet 6   prochlorperazine (COMPAZINE) 10 MG tablet Take 1 tablet (10 mg total) by mouth every 6 (six) hours as needed for nausea or vomiting. 40 tablet 1   rizatriptan (MAXALT-MLT) 10 MG disintegrating tablet Take 5-10 mg by mouth as needed for migraine. May repeat in 2 hours if needed     rosuvastatin (CRESTOR) 10 MG tablet Take 1 tablet (10 mg total) by mouth every morning. 90 tablet 1   triamterene-hydrochlorothiazide (MAXZIDE-25) 37.5-25 MG tablet TAKE 1 TABLET BY MOUTH EVERY DAY (Patient taking differently: Take 1 tablet by mouth every morning.) 90 tablet 0   No current facility-administered medications for this visit.      Marland Kitchen  PHYSICAL EXAMINATION: ECOG PERFORMANCE STATUS: 0 - Asymptomatic  Vitals:   07/26/22 1025  BP: (!) 152/81  Pulse: 85  Temp: (!) 97.2 F (36.2 C)  SpO2: 100%   Filed Weights   07/26/22 1025  Weight: 194 lb 9.6 oz (88.3 kg)    Physical Exam Vitals and nursing note reviewed.  HENT:     Head: Normocephalic and atraumatic.     Mouth/Throat:     Pharynx: Oropharynx is clear.  Eyes:     Extraocular Movements: Extraocular movements intact.     Pupils: Pupils are equal, round, and reactive to light.  Cardiovascular:     Rate and Rhythm: Normal rate and regular rhythm.  Pulmonary:     Comments: Decreased breath sounds bilaterally.  Abdominal:     Palpations: Abdomen is soft.  Musculoskeletal:        General: Normal range of motion.     Cervical back: Normal range of motion.  Skin:    General: Skin is warm.  Neurological:     General: No focal deficit present.     Mental Status: She is alert and oriented to person, place, and time.  Psychiatric:        Behavior: Behavior normal.        Judgment: Judgment normal.      LABORATORY DATA:  I have reviewed  the data as listed Lab Results  Component Value Date   WBC 9.6 07/26/2022   HGB 9.7 (L) 07/26/2022   HCT 30.9 (L) 07/26/2022   MCV  90.1 07/26/2022   PLT 285 07/26/2022   Recent Labs    07/13/22 0828 07/20/22 0812 07/26/22 0950  NA 141 140 139  K 3.0* 3.3* 3.0*  CL 105 105 104  CO2 29 27 26  GLUCOSE 159* 148* 164*  BUN 11 12 12  CREATININE 0.67 0.72 0.85  CALCIUM 8.8* 8.7* 8.5*  GFRNONAA >60 >60 >60  PROT 6.6 6.6 6.5  ALBUMIN 3.4* 3.6 3.5  AST 21 24 22  ALT 23 23 16  ALKPHOS 73 83 93  BILITOT 0.4 0.5 0.3    RADIOGRAPHIC STUDIES: I have personally reviewed the radiological images as listed and agreed with the findings in the report. No results found.  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  Patient currently on adjuvant chemoimmunotherapy [KEYNOTE 522 study]  # Proceed with cycle # 3 day 1 tomorrow CarboTaxol Keytruda.  We will dose reduce carboplatin to AUC 4 -given recent neutropenia. JUNE 2023-  TSH-WNL.  June 6th- ECHO- 05/25/2022- 65-70%.   #Peripheral neuropathy-grade 1; from Taxol.  Monitor for now.  #Genetic counseling: Pending  # DISPOSITION: # Please add Iron studies/ferritin to labs today  # as planned chemo- carbo-taxol-keytruda  # 8/15- labs- cbc/cmp; taxol  # 8/22-labs-cbc/cmp;taxol   # Zarxio-8/23; 8/24; 8/25   # Follow up MD- in 3 weeks-;labs- cbc/cmp; D-2 Adriamycin-Cytoxan--keytruda- Dr.B     All questions were answered. The patient/family knows to call the clinic with any problems, questions or concerns.        R , MD 07/26/2022 10:58 AM     

## 2022-07-26 NOTE — Progress Notes (Signed)
Should she continue to take the 81 mg Aspirin?   How many days should she keep taking the Claritin before treatment or after?   C/o some bloody nasal drainage.

## 2022-07-27 ENCOUNTER — Inpatient Hospital Stay: Payer: Medicare Other

## 2022-07-27 ENCOUNTER — Ambulatory Visit: Payer: Medicare Other

## 2022-07-27 VITALS — BP 135/95 | HR 88 | Temp 99.1°F | Resp 18 | Wt 194.4 lb

## 2022-07-27 DIAGNOSIS — C50411 Malignant neoplasm of upper-outer quadrant of right female breast: Secondary | ICD-10-CM

## 2022-07-27 DIAGNOSIS — Z5112 Encounter for antineoplastic immunotherapy: Secondary | ICD-10-CM | POA: Diagnosis not present

## 2022-07-27 DIAGNOSIS — Z171 Estrogen receptor negative status [ER-]: Secondary | ICD-10-CM

## 2022-07-27 LAB — IRON AND TIBC
Iron: 45 ug/dL (ref 28–170)
Saturation Ratios: 12 % (ref 10.4–31.8)
TIBC: 363 ug/dL (ref 250–450)
UIBC: 318 ug/dL

## 2022-07-27 LAB — TSH: TSH: 1.152 u[IU]/mL (ref 0.350–4.500)

## 2022-07-27 LAB — FERRITIN: Ferritin: 142 ng/mL (ref 11–307)

## 2022-07-27 MED ORDER — SODIUM CHLORIDE 0.9 % IV SOLN
200.0000 mg | Freq: Once | INTRAVENOUS | Status: AC
  Administered 2022-07-27: 200 mg via INTRAVENOUS
  Filled 2022-07-27: qty 200

## 2022-07-27 MED ORDER — SODIUM CHLORIDE 0.9 % IV SOLN
10.0000 mg | Freq: Once | INTRAVENOUS | Status: AC
  Administered 2022-07-27: 10 mg via INTRAVENOUS
  Filled 2022-07-27: qty 10

## 2022-07-27 MED ORDER — SODIUM CHLORIDE 0.9 % IV SOLN
80.0000 mg/m2 | Freq: Once | INTRAVENOUS | Status: AC
  Administered 2022-07-27: 162 mg via INTRAVENOUS
  Filled 2022-07-27: qty 27

## 2022-07-27 MED ORDER — FAMOTIDINE IN NACL 20-0.9 MG/50ML-% IV SOLN
20.0000 mg | Freq: Once | INTRAVENOUS | Status: AC
  Administered 2022-07-27: 20 mg via INTRAVENOUS
  Filled 2022-07-27: qty 50

## 2022-07-27 MED ORDER — HEPARIN SOD (PORK) LOCK FLUSH 100 UNIT/ML IV SOLN
500.0000 [IU] | Freq: Once | INTRAVENOUS | Status: AC | PRN
  Administered 2022-07-27: 500 [IU]
  Filled 2022-07-27: qty 5

## 2022-07-27 MED ORDER — SODIUM CHLORIDE 0.9 % IV SOLN
400.0000 mg | Freq: Once | INTRAVENOUS | Status: AC
  Administered 2022-07-27: 400 mg via INTRAVENOUS
  Filled 2022-07-27: qty 40

## 2022-07-27 MED ORDER — PALONOSETRON HCL INJECTION 0.25 MG/5ML
0.2500 mg | Freq: Once | INTRAVENOUS | Status: AC
  Administered 2022-07-27: 0.25 mg via INTRAVENOUS
  Filled 2022-07-27: qty 5

## 2022-07-27 MED ORDER — DIPHENHYDRAMINE HCL 50 MG/ML IJ SOLN
50.0000 mg | Freq: Once | INTRAMUSCULAR | Status: AC
  Administered 2022-07-27: 50 mg via INTRAVENOUS
  Filled 2022-07-27: qty 1

## 2022-07-27 MED ORDER — SODIUM CHLORIDE 0.9 % IV SOLN
Freq: Once | INTRAVENOUS | Status: AC
  Filled 2022-07-27: qty 250

## 2022-07-27 MED ORDER — SODIUM CHLORIDE 0.9 % IV SOLN
150.0000 mg | Freq: Once | INTRAVENOUS | Status: AC
  Administered 2022-07-27: 150 mg via INTRAVENOUS
  Filled 2022-07-27: qty 150

## 2022-07-27 NOTE — Progress Notes (Signed)
Nutrition Follow-up:  Patient with triple negative breast cancer.  Patient receiving chemotherapy.   Met with patient during infusion.  Patient reports that her appetite is good.  Sometimes feels tired.  Says that she has a name and number of another allergist in [place] that she wants to call and make an appointment with for second opinion.  Has been trying to eat small frequent meals.  Feels better when she eats.  Trying to stay hydrated.      Medications: reviewed  Labs: reviewed  Anthropometrics:   Weight 194 lb 9.6 oz today  198 lb on 6/22   NUTRITION DIAGNOSIS: Food and nutrition related knowledge deficit improved   INTERVENTION:  Continue small frequent meals Encourage making appointment with allergist. Encouraged plant based diet as best she can within allergy restrictions.  RD available as needed    NEXT VISIT: no follow-up RD available as needed  Jamayah Myszka B. Zenia Resides, [place], [place] Registered Dietitian [phone]

## 2022-07-29 ENCOUNTER — Other Ambulatory Visit: Payer: Self-pay | Admitting: Family Medicine

## 2022-07-29 DIAGNOSIS — I10 Essential (primary) hypertension: Secondary | ICD-10-CM

## 2022-07-29 NOTE — Telephone Encounter (Signed)
Lvm to schedule pt an appt for med refill

## 2022-08-02 MED FILL — Dexamethasone Sodium Phosphate Inj 100 MG/10ML: INTRAMUSCULAR | Qty: 1 | Status: AC

## 2022-08-03 ENCOUNTER — Inpatient Hospital Stay: Payer: Medicare Other

## 2022-08-03 VITALS — BP 136/85 | HR 88 | Temp 97.8°F | Wt 191.8 lb

## 2022-08-03 DIAGNOSIS — Z171 Estrogen receptor negative status [ER-]: Secondary | ICD-10-CM

## 2022-08-03 DIAGNOSIS — Z5112 Encounter for antineoplastic immunotherapy: Secondary | ICD-10-CM | POA: Diagnosis not present

## 2022-08-03 LAB — CBC WITH DIFFERENTIAL/PLATELET
Abs Immature Granulocytes: 0.03 10*3/uL (ref 0.00–0.07)
Basophils Absolute: 0 10*3/uL (ref 0.0–0.1)
Basophils Relative: 1 %
Eosinophils Absolute: 0 10*3/uL (ref 0.0–0.5)
Eosinophils Relative: 0 %
HCT: 30 % — ABNORMAL LOW (ref 36.0–46.0)
Hemoglobin: 9.4 g/dL — ABNORMAL LOW (ref 12.0–15.0)
Immature Granulocytes: 1 %
Lymphocytes Relative: 33 %
Lymphs Abs: 1.2 10*3/uL (ref 0.7–4.0)
MCH: 28.4 pg (ref 26.0–34.0)
MCHC: 31.3 g/dL (ref 30.0–36.0)
MCV: 90.6 fL (ref 80.0–100.0)
Monocytes Absolute: 0.2 10*3/uL (ref 0.1–1.0)
Monocytes Relative: 5 %
Neutro Abs: 2.4 10*3/uL (ref 1.7–7.7)
Neutrophils Relative %: 60 %
Platelets: 203 10*3/uL (ref 150–400)
RBC: 3.31 MIL/uL — ABNORMAL LOW (ref 3.87–5.11)
RDW: 15.9 % — ABNORMAL HIGH (ref 11.5–15.5)
WBC: 3.8 10*3/uL — ABNORMAL LOW (ref 4.0–10.5)
nRBC: 0 % (ref 0.0–0.2)

## 2022-08-03 LAB — COMPREHENSIVE METABOLIC PANEL
ALT: 22 U/L (ref 0–44)
AST: 27 U/L (ref 15–41)
Albumin: 3.4 g/dL — ABNORMAL LOW (ref 3.5–5.0)
Alkaline Phosphatase: 77 U/L (ref 38–126)
Anion gap: 9 (ref 5–15)
BUN: 14 mg/dL (ref 8–23)
CO2: 26 mmol/L (ref 22–32)
Calcium: 8.8 mg/dL — ABNORMAL LOW (ref 8.9–10.3)
Chloride: 102 mmol/L (ref 98–111)
Creatinine, Ser: 0.79 mg/dL (ref 0.44–1.00)
GFR, Estimated: >60 mL/min (ref >60)
Glucose, Bld: 160 mg/dL — ABNORMAL HIGH (ref 70–99)
Potassium: 3 mmol/L — ABNORMAL LOW (ref 3.5–5.1)
Sodium: 137 mmol/L (ref 135–145)
Total Bilirubin: 0.2 mg/dL — ABNORMAL LOW (ref 0.3–1.2)
Total Protein: 6.8 g/dL (ref 6.5–8.1)

## 2022-08-03 MED ORDER — SODIUM CHLORIDE 0.9% FLUSH
10.0000 mL | Freq: Once | INTRAVENOUS | Status: AC
  Administered 2022-08-03: 10 mL via INTRAVENOUS
  Filled 2022-08-03: qty 10

## 2022-08-03 MED ORDER — FAMOTIDINE IN NACL 20-0.9 MG/50ML-% IV SOLN
INTRAVENOUS | Status: AC
  Filled 2022-08-03: qty 50

## 2022-08-03 MED ORDER — SODIUM CHLORIDE 0.9 % IV SOLN
10.0000 mg | Freq: Once | INTRAVENOUS | Status: AC
  Administered 2022-08-03: 10 mg via INTRAVENOUS
  Filled 2022-08-03: qty 10

## 2022-08-03 MED ORDER — SODIUM CHLORIDE 0.9% FLUSH
10.0000 mL | Freq: Once | INTRAVENOUS | Status: DC
  Filled 2022-08-03: qty 10

## 2022-08-03 MED ORDER — DIPHENHYDRAMINE HCL 50 MG/ML IJ SOLN
50.0000 mg | Freq: Once | INTRAMUSCULAR | Status: AC
  Administered 2022-08-03: 50 mg via INTRAVENOUS

## 2022-08-03 MED ORDER — DIPHENHYDRAMINE HCL 50 MG/ML IJ SOLN
INTRAMUSCULAR | Status: AC
  Filled 2022-08-03: qty 1

## 2022-08-03 MED ORDER — SODIUM CHLORIDE 0.9 % IV SOLN
80.0000 mg/m2 | Freq: Once | INTRAVENOUS | Status: AC
  Administered 2022-08-03: 162 mg via INTRAVENOUS
  Filled 2022-08-03: qty 27

## 2022-08-03 MED ORDER — FAMOTIDINE IN NACL 20-0.9 MG/50ML-% IV SOLN
20.0000 mg | Freq: Once | INTRAVENOUS | Status: AC
  Administered 2022-08-03: 20 mg via INTRAVENOUS

## 2022-08-03 MED ORDER — SODIUM CHLORIDE 0.9% FLUSH
10.0000 mL | INTRAVENOUS | Status: DC | PRN
  Administered 2022-08-03: 10 mL
  Filled 2022-08-03: qty 10

## 2022-08-03 MED ORDER — SODIUM CHLORIDE 0.9 % IV SOLN
Freq: Once | INTRAVENOUS | Status: AC
  Filled 2022-08-03: qty 250

## 2022-08-03 NOTE — Progress Notes (Signed)
Patient in office for lab/tx today.  Secure chat received from infusion nurse Amie [place], RN reporting potassium lab 3.0 today.  Dr. B recommends patient take Kdur 20 meq BID, no need for IV KCL today.    RN confirmed with patient that she does have Kdur 10 meq QD at home and does not need additional prescriptions at this time

## 2022-08-03 NOTE — Patient Instructions (Signed)
[facility] [place]  Discharge Instructions: Thank you for choosing [facility] to provide your oncology and hematology care.  If you have a lab appointment with the [facility], please go directly to the [facility] and check in at the registration area.  Wear comfortable clothing and clothing appropriate for easy access to any Portacath or PICC line.   We strive to give you quality time with your provider. You may need to reschedule your appointment if you arrive late (15 or more minutes).  Arriving late affects you and other patients whose appointments are after yours.  Also, if you miss three or more appointments without notifying the office, you may be dismissed from the clinic at the provider's discretion.      For prescription refill requests, have your pharmacy contact our office and allow 72 hours for refills to be completed.    Today you received the following chemotherapy and/or immunotherapy agents: PACLitaxel   To help prevent nausea and vomiting after your treatment, we encourage you to take your nausea medication as directed.  BELOW ARE SYMPTOMS THAT SHOULD BE REPORTED IMMEDIATELY: *FEVER GREATER THAN 100.4 F (38 C) OR HIGHER *CHILLS OR SWEATING *NAUSEA AND VOMITING THAT IS NOT CONTROLLED WITH YOUR NAUSEA MEDICATION *UNUSUAL SHORTNESS OF BREATH *UNUSUAL BRUISING OR BLEEDING *URINARY PROBLEMS (pain or burning when urinating, or frequent urination) *BOWEL PROBLEMS (unusual diarrhea, constipation, pain near the anus) TENDERNESS IN MOUTH AND THROAT WITH OR WITHOUT PRESENCE OF ULCERS (sore throat, sores in mouth, or a toothache) UNUSUAL RASH, SWELLING OR PAIN  UNUSUAL VAGINAL DISCHARGE OR ITCHING   Items with * indicate a potential emergency and should be followed up as soon as possible or go to the Emergency Department if any problems should occur.  Please show the CHEMOTHERAPY ALERT CARD or IMMUNOTHERAPY ALERT CARD at check-in to  the Emergency Department and triage nurse.  Should you have questions after your visit or need to cancel or reschedule your appointment, please contact [facility] [place] [facility] [place]  [phone] and follow the prompts.  Office hours are 8:00 a.m. to 4:30 p.m. Monday - Friday. Please note that voicemails left after 4:00 p.m. may not be returned until the following business day.  We are closed weekends and major holidays. You have access to a nurse at all times for urgent questions. Please call the main number to the clinic [phone] and follow the prompts.  For any non-urgent questions, you may also contact your provider using MyChart. We now offer e-Visits for anyone 25 and older to request care online for non-urgent symptoms. For details visit [email or web address].   Also download the MyChart app! Go to the app store, search "MyChart", open the app, select [facility], and log in with your MyChart username and password.  Masks are optional in the cancer centers. If you would like for your care team to wear a mask while they are taking care of you, please let them know. For doctor visits, patients may have with them one support person who is at least 71 years old. At this time, visitors are not allowed in the infusion area.

## 2022-08-05 ENCOUNTER — Telehealth (INDEPENDENT_AMBULATORY_CARE_PROVIDER_SITE_OTHER): Payer: Medicare Other | Admitting: Family Medicine

## 2022-08-05 ENCOUNTER — Encounter: Payer: Self-pay | Admitting: Family Medicine

## 2022-08-05 DIAGNOSIS — E1159 Type 2 diabetes mellitus with other circulatory complications: Secondary | ICD-10-CM | POA: Diagnosis not present

## 2022-08-05 DIAGNOSIS — K581 Irritable bowel syndrome with constipation: Secondary | ICD-10-CM

## 2022-08-05 DIAGNOSIS — E559 Vitamin D deficiency, unspecified: Secondary | ICD-10-CM

## 2022-08-05 DIAGNOSIS — Z171 Estrogen receptor negative status [ER-]: Secondary | ICD-10-CM

## 2022-08-05 DIAGNOSIS — I6523 Occlusion and stenosis of bilateral carotid arteries: Secondary | ICD-10-CM | POA: Diagnosis not present

## 2022-08-05 DIAGNOSIS — E785 Hyperlipidemia, unspecified: Secondary | ICD-10-CM

## 2022-08-05 DIAGNOSIS — I152 Hypertension secondary to endocrine disorders: Secondary | ICD-10-CM

## 2022-08-05 DIAGNOSIS — I10 Essential (primary) hypertension: Secondary | ICD-10-CM

## 2022-08-05 DIAGNOSIS — E1169 Type 2 diabetes mellitus with other specified complication: Secondary | ICD-10-CM

## 2022-08-05 DIAGNOSIS — Z91018 Allergy to other foods: Secondary | ICD-10-CM

## 2022-08-05 DIAGNOSIS — G43109 Migraine with aura, not intractable, without status migrainosus: Secondary | ICD-10-CM

## 2022-08-05 DIAGNOSIS — M545 Low back pain, unspecified: Secondary | ICD-10-CM

## 2022-08-05 DIAGNOSIS — C50411 Malignant neoplasm of upper-outer quadrant of right female breast: Secondary | ICD-10-CM

## 2022-08-05 MED ORDER — SPIRONOLACTONE 25 MG PO TABS: 25.0000 mg | ORAL_TABLET | Freq: Every day | ORAL | 0 refills | Status: DC

## 2022-08-05 MED ORDER — ATENOLOL 25 MG PO TABS
25.0000 mg | ORAL_TABLET | Freq: Every day | ORAL | 0 refills | Status: DC
Start: 2022-08-05 — End: 2023-03-11

## 2022-08-05 NOTE — Assessment & Plan Note (Signed)
Evaluated by Dr. Manuella Ghazi - neurologist, taking medication prn and doing well

## 2022-08-05 NOTE — Progress Notes (Signed)
Name: Morgan Peterson   MRN: [id number]    DOB: 1951/05/21   Date:08/05/2022       Progress Note  Subjective  Chief Complaint  Chief Complaint  Patient presents with   Medication Refill    I connected with  [name]  on 08/05/22 at 11:40 AM EDT by a video enabled telemedicine application and verified that I am speaking with the correct person using two identifiers.  I discussed the limitations of evaluation and management by telemedicine and the availability of in person appointments. The patient expressed understanding and agreed to proceed with the virtual visit  Staff also discussed with the patient that there may be a patient responsible charge related to this service. 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 She had mastectomy done by Dr. Fleet Contras and is now getting chemotherapy / Oncologist is Dr. Rogue Bussing. She is tolerating it well. Discussed vaccines - COVID-19 E5 booster, RSV and flu vaccines, advised to ask oncologist best time to get those vaccines.   Hives: back in Oct 2020 she developed hives after eating , it happened two more times and she came in to see me. She had a lab test that showed a lot of food allergies, she saw allergist  and patch test was negative. She has been avoiding gluten, milk , egg white, peanut , shrimp, scallop, sesame seed .  She is still following the diet, too afraid to having hives again. No recent outbreaks    HTN: She has been taking medication as prescribed Atenolol and Triamterene HCTZ,  potassium has been low and oncologist added potassium orally. BP last week was elevated, but usually at goal. Denies chest pain or palpitation. We will stop potassium and switch from Trimaterene HcTZ to spironolactone, she  states she will have repeat potassium level done at oncologist next week.    DM: she is seeing  Endocrinologist Dr. Honor Junes , last A1C was at goal at 6.8% Feb 2023 . She has associated dyslipidemia and obesity. She. Denies polyphagia, polydipsia or polyuria . Continue Crestor for dyslipidemia, allergic to ACE/ARB   Migraine headaches: she was seen by Dr. Manuella Ghazi, had labs done in May 22, and MRI was unremarkable. 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 She states her bowel movements are better since she has been eating oatmeal and more fiber in her diet, no longer taking Miralax, she is taking Curerelle otc and drinking more water , unchanged    Thyroid nodule: monitored by Dr. Manfred Shirts, last TSH normal , continue regular follow ups   Carotid atherosclerosis: found on Cspine x-ray, also on exam today she has a pulstaing mass on right side of neck   IMPRESSION: 12/2021 carotid doppler  No evidence of hemodynamically significant stenosis involving either the right or left carotid circulation in the neck by Doppler criteria. Minimal plaque in the right carotid bulb results in less than 50% stenosis. No significant plaque appreciated in the left carotid circulation.     Intermittent low back pain: dull aching, sometimes burning, no radiculitis, not currently taking medication or seeing chiropractor  Patient Active Problem List   Diagnosis Date Noted   Carcinoma of upper-outer quadrant of right breast in female, estrogen receptor negative ([place]) 05/26/2022   Breast cancer ([place]) 05/10/2022   Goals of care, counseling/discussion 05/10/2022   Encounter for monitoring cardiotoxic drug therapy 05/10/2022   Carotid stenosis 04/21/2022   Hypertension associated with type 2 diabetes mellitus ([place]) 01/20/2021   Chronic low back  pain without sciatica 08/18/2017   Irritable bowel syndrome with constipation 05/02/2017   Vitamin D deficiency 09/09/2016   Hiatal hernia 06/02/2016   Allergic rhinitis, seasonal 06/02/2016   Obesity (BMI 30-39.9) 04/30/2016   Angio-edema 04/30/2016   Hyperlipidemia 11/04/2015   Hyperlipidemia due to type 2 diabetes mellitus ([place]) 11/04/2015   Cervical radiculopathy at C6 07/31/2015   Neuroma digital nerve 07/31/2015   Type 2 diabetes mellitus with hyperglycemia, with long-term current use of insulin ([place]) 06/30/2015   Cervical disc disease 06/30/2015   Essential hypertension 06/30/2015   History of breast cancer in female 03/23/2009    Past Surgical History:  Procedure Laterality Date   ABDOMINAL EXPLORATION SURGERY  2000   ovarian cyst   BREAST BIOPSY Right 2010   +    BREAST EXCISIONAL BIOPSY Right 2010   + mammo site inasive mammo ca   BREAST LUM[facility]OMY Right 2010   [facility]   COLONOSCOPY  07/19/2012   Normal exam, Dr. Bary Castilla   COLONOSCOPY WITH PROPOFOL N/A 04/21/2022   Procedure: COLONOSCOPY WITH PROPOFOL;  Surgeon: Robert Bellow, MD;  Location: [facility];  Service: Endoscopy;  Laterality: N/A;   MASTECTOMY Right 2010   partial/ lumpectomy   PORTACATH PLACEMENT Left 05/19/2022   Procedure: INSERTION PORT-A-CATH;  Surgeon: Robert Bellow, MD;  Location: [facility] ORS;  Service: General;  Laterality: Left;   SIMPLE MASTECTOMY WITH AXILLARY SENTINEL NODE BIOPSY Right 04/26/2022   Procedure: SIMPLE MASTECTOMY WITH AXILLARY SENTINEL NODE BIOPSY;  Surgeon: Robert Bellow, MD;  Location: [facility] ORS;  Service: General;  Laterality: Right;  RNFA to assist   TUBAL LIGATION  20 years ago    Family History  Problem Relation Age of Onset   Osteoporosis Mother    Diabetes Father    Kidney disease Father    Diabetes Brother    Breast cancer Maternal Aunt    Bladder Cancer Maternal Aunt    Cervical cancer Maternal Aunt    Colon cancer Maternal Uncle    Lung cancer Maternal  Uncle     Social History   Socioeconomic History   Marital status: Married    Spouse name: Antonio   Number of children: 2   Years of education: Not on file   Highest education level: Not on file  Occupational History   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  Tobacco Use   Smoking status: Former    Packs/day: 0.25    Years: 10.00    Total pack years: 2.50    Types: Cigarettes    Start date: 1978    Quit date: 1988    Years since quitting: 35.6   Smokeless tobacco: Never   Tobacco comments:    smoking cessation materials not required  Vaping Use   Vaping Use: Never used  Substance and Sexual Activity   Alcohol use: Yes    Comment: wine occ   Drug use: No   Sexual activity: Not Currently    Comment: husband has ED  Other Topics Concern   Not on file  Social History Narrative  Oncology nurse on the floor retired.;  Husband retired from [employer].  No smoking.   Social Determinants of Health   Financial Resource Strain: Low Risk  (12/31/2021)   Overall Financial Resource Strain (CARDIA)    Difficulty of Paying Living Expenses: Not hard at all  Food Insecurity: No Food Insecurity (12/31/2021)   Hunger Vital Sign    Worried About Running Out of Food in the Last Year: Never true    Ran Out of Food in the Last Year: Never true  Transportation Needs: No Transportation Needs (12/31/2021)   PRAPARE - Hydrologist (Medical): No    Lack of Transportation (Non-Medical): No  Physical Activity: Inactive (12/31/2021)   Exercise Vital Sign    Days of Exercise per Week: 0 days    Minutes of Exercise per Session: 0 min  Stress: No Stress Concern Present (12/31/2021)   [place]    Feeling of Stress : Only a little  Social Connections: Moderately Integrated (12/31/2021)   Social Connection and Isolation Panel [NHANES]    Frequency of Communication with Friends and Family: More than  three times a week    Frequency of Social Gatherings with Friends and Family: Three times a week    Attends Religious Services: More than 4 times per year    Active Member of Clubs or Organizations: No    Attends Archivist Meetings: Never    Marital Status: Married  Human resources officer Violence: Not At Risk (12/31/2021)   Humiliation, Afraid, Rape, and Kick questionnaire    Fear of Current or Ex-Partner: No    Emotionally Abused: No    Physically Abused: No    Sexually Abused: No     Current Outpatient Medications:    aspirin EC 81 MG tablet, Take 1 tablet (81 mg total) by mouth daily., Disp: 30 tablet, Rfl: 0   atenolol (TENORMIN) 25 MG tablet, Take 1 tablet (25 mg total) by mouth daily. (Patient taking differently: Take 25 mg by mouth every morning.), Disp: 90 tablet, Rfl: 1   azelastine (ASTELIN) 0.1 % nasal spray, Place 2 sprays into both nostrils daily as needed for allergies or rhinitis. Use in each nostril as directed, Disp: , Rfl:    betamethasone, augmented, (DIPROLENE) 0.05 % lotion, Apply 1 application topically 2 (two) times daily., Disp: , Rfl:    Blood Glucose Monitoring Suppl (ONETOUCH VERIO) w/Device KIT, 1 Device by Does not apply route once., Disp: 1 kit, Rfl: 0   cholecalciferol (VITAMIN D) 1000 UNITS tablet, Take 1,000 Units by mouth daily., Disp: , Rfl:    Cyanocobalamin (VITAMIN B-12 PO), Take 1 tablet by mouth daily as needed (fatigue)., Disp: , Rfl:    EPINEPHrine (EPIPEN 2-PAK) 0.3 mg/0.3 mL IJ SOAJ injection, Inject 0.3 mg into the muscle as needed for anaphylaxis., Disp: 2 each, Rfl: 0   famotidine (PEPCID) 20 MG tablet, Take 20 mg by mouth 2 (two) times daily., Disp: , Rfl:    glucose blood (ONETOUCH VERIO) test strip, Use as instructed, Disp: 100 each, Rfl: 12   Lancets (ONETOUCH ULTRASOFT) lancets, Use as instructed, Disp: 100 each, Rfl: 3   levocetirizine (XYZAL) 5 MG tablet, Take 1 tablet (5 mg total) by mouth every evening. (Patient taking  differently: Take 5 mg by mouth daily as needed for allergies.), Disp: 90 tablet, Rfl: 1   lidocaine-prilocaine (EMLA) cream, Apply 1 application. topically as needed. Apply to port and cover with  saran wrap 1-2 hours prior to port access, Disp: 30 g, Rfl: 1   montelukast (SINGULAIR) 10 MG tablet, Take 1 tablet (10 mg total) by mouth at bedtime. (Patient taking differently: Take 10 mg by mouth at bedtime as needed (allergies).), Disp: 90 tablet, Rfl: 1   Multiple Vitamin (MULTIVITAMIN WITH MINERALS) TABS tablet, Take 1 tablet by mouth 2 (two) times a week., Disp: , Rfl:    ondansetron (ZOFRAN) 8 MG tablet, Take 1 tablet (8 mg total) by mouth every 8 (eight) hours as needed for nausea or vomiting., Disp: 40 tablet, Rfl: 1   OZEMPIC, 0.25 OR 0.5 MG/DOSE, 2 MG/1.5ML SOPN, Inject 0.5 mg into the skin every Sunday., Disp: , Rfl:    potassium chloride (KLOR-CON M) 10 MEQ tablet, Take 1 tablet (10 mEq total) by mouth daily., Disp: 30 tablet, Rfl: 6   prochlorperazine (COMPAZINE) 10 MG tablet, Take 1 tablet (10 mg total) by mouth every 6 (six) hours as needed for nausea or vomiting., Disp: 40 tablet, Rfl: 1   rizatriptan (MAXALT-MLT) 10 MG disintegrating tablet, Take 5-10 mg by mouth as needed for migraine. May repeat in 2 hours if needed, Disp: , Rfl:    rosuvastatin (CRESTOR) 10 MG tablet, Take 1 tablet (10 mg total) by mouth every morning., Disp: 90 tablet, Rfl: 1   triamterene-hydrochlorothiazide (MAXZIDE-25) 37.5-25 MG tablet, TAKE 1 TABLET BY MOUTH EVERY DAY (Patient taking differently: Take 1 tablet by mouth every morning.), Disp: 90 tablet, Rfl: 0  Allergies  Allergen Reactions   Levaquin [Levofloxacin In D5w] Swelling    Other reaction(s): Arthralgia (Joint Pain)   Losartan     Other reaction(s): Angioedema   Metoprolol Swelling    Lip and tongue tingling and numbness   Amlodipine-Olmesartan    Canagliflozin Other (See Comments)    Bladder pain   Egg White [Egg White (Egg Protein)]      Unknown reaction   Gabapentin     cns side effects.   Glipizide     Other reaction(s): Abdominal pain   Gluten Meal    Lisinopril Swelling    Angioedema   Metformin And Related Nausea Only   Milk (Cow)    Milk-Related Compounds    Onglyza [Saxagliptin] Other (See Comments)    Increased PVC's   Shrimp Extract Allergy Skin Test     Other reaction(s): Other (See Comments)   Tramadol Nausea And Vomiting   Actos [Pioglitazone] Other (See Comments) and Nausea And Vomiting    Bladder pain   Atorvastatin Other (See Comments)    Other reaction(s): Abdominal Pain, Other (See Comments)   Carvedilol Other (See Comments)    NUMBNESS, TINGLING, ACHING IN ARMS   Prednisone Palpitations    I personally reviewed active problem list, medication list, allergies with the patient/caregiver today.   ROS  Ten systems reviewed and is negative except as mentioned in HPI   Objective  Virtual encounter, vitals not obtained.  There is no height or weight on file to calculate BMI.  Physical Exam  Awake, alert and oriented   Results for orders placed or performed in visit on 08/03/22 (from the past 72 hour(s))  Comprehensive metabolic panel     Status: Abnormal   Collection Time: 08/03/22  9:03 AM  Result Value Ref Range   Sodium 137 135 - 145 mmol/L   Potassium 3.0 (L) 3.5 - 5.1 mmol/L   Chloride 102 98 - 111 mmol/L   CO2 26 22 - 32 mmol/L   Glucose, Bld  160 (H) 70 - 99 mg/dL    Comment: Glucose reference range applies only to samples taken after fasting for at least 8 hours.   BUN 14 8 - 23 mg/dL   Creatinine, Ser 0.79 0.44 - 1.00 mg/dL   Calcium 8.8 (L) 8.9 - 10.3 mg/dL   Total Protein 6.8 6.5 - 8.1 g/dL   Albumin 3.4 (L) 3.5 - 5.0 g/dL   AST 27 15 - 41 U/L   ALT 22 0 - 44 U/L   Alkaline Phosphatase 77 38 - 126 U/L   Total Bilirubin 0.2 (L) 0.3 - 1.2 mg/dL   GFR, Estimated >60 >60 mL/min    Comment: (NOTE) Calculated using the CKD-EPI Creatinine Equation (2021)    Anion gap 9 5  - 15    Comment: Performed at [facility], [address] [place], [place] [address]  CBC with Differential/Platelet     Status: Abnormal   Collection Time: 08/03/22  9:03 AM  Result Value Ref Range   WBC 3.8 (L) 4.0 - 10.5 K/uL   RBC 3.31 (L) 3.87 - 5.11 MIL/uL   Hemoglobin 9.4 (L) 12.0 - 15.0 g/dL   HCT 30.0 (L) 36.0 - 46.0 %   MCV 90.6 80.0 - 100.0 fL   MCH 28.4 26.0 - 34.0 pg   MCHC 31.3 30.0 - 36.0 g/dL   RDW 15.9 (H) 11.5 - 15.5 %   Platelets 203 150 - 400 K/uL   nRBC 0.0 0.0 - 0.2 %   Neutrophils Relative % 60 %   Neutro Abs 2.4 1.7 - 7.7 K/uL   Lymphocytes Relative 33 %   Lymphs Abs 1.2 0.7 - 4.0 K/uL   Monocytes Relative 5 %   Monocytes Absolute 0.2 0.1 - 1.0 K/uL   Eosinophils Relative 0 %   Eosinophils Absolute 0.0 0.0 - 0.5 K/uL   Basophils Relative 1 %   Basophils Absolute 0.0 0.0 - 0.1 K/uL   Immature Granulocytes 1 %   Abs Immature Granulocytes 0.03 0.00 - 0.07 K/uL    Comment: Performed at [facility], [address] [place], [place] [address]    PHQ2/9:    08/05/2022   11:37 AM 01/11/2022    3:05 PM 12/31/2021    9:16 AM 12/24/2021   11:43 AM 08/12/2021    9:00 AM  Depression screen PHQ 2/9  Decreased Interest 0 0 0 0 0  Down, Depressed, Hopeless 0 0 0 0 0  PHQ - 2 Score 0 0 0 0 0  Altered sleeping 0  0    Tired, decreased energy 3  0    Change in appetite 0  0    Feeling bad or failure about yourself  0  0    Trouble concentrating 0  0    Moving slowly or fidgety/restless 0  0    Suicidal thoughts 0  0    PHQ-9 Score 3  0     PHQ-2/9 Result is negative.    Fall Risk:    08/05/2022   11:36 AM 01/11/2022    3:05 PM 12/31/2021    9:16 AM 12/24/2021   11:45 AM 08/12/2021    9:00 AM  Fall Risk   Falls in the past year? 0 0 0 0 0  Number falls in past yr: 0 0 0 0 0  Injury with Fall? 0 0 0 0 0  Risk for fall due to : No Fall Risks  No Fall Risks No Fall Risks No  Fall Risks  Follow up Falls prevention discussed Falls evaluation  completed Falls prevention discussed Falls prevention discussed Falls prevention discussed     Assessment & Plan  Problem List Items Addressed This Visit     Dyslipidemia associated with type 2 diabetes mellitus ([place]) - Primary   Intermittent low back pain   Hyperlipidemia due to type 2 diabetes mellitus ([facility])    Last LDL at goal, continue Rosuvastatin       Relevant Medications   atenolol (TENORMIN) 25 MG tablet   spironolactone (ALDACTONE) 25 MG tablet   Hypertension associated with type 2 diabetes mellitus ([place])    DM is at goal, under the care of Dr. Honor Junes  Changing bp medication due to hypokalemia She cannot take ACE/ARB due to angioedema       Relevant Medications   atenolol (TENORMIN) 25 MG tablet   spironolactone (ALDACTONE) 25 MG tablet   Carcinoma of upper-outer quadrant of right breast in female, estrogen receptor negative ([place])    Under the care of Dr. Rogue Bussing Denies any symptoms of depression or anxiety Mild nausea but controlled with medication Discussed immunizations       Food allergy   Atherosclerosis of both carotid arteries    On statin therapy       Relevant Medications   atenolol (TENORMIN) 25 MG tablet   spironolactone (ALDACTONE) 25 MG tablet   Migraine aura without headache    Evaluated by Dr. Manuella Ghazi - neurologist, taking medication prn and doing well       Relevant Medications   atenolol (TENORMIN) 25 MG tablet   spironolactone (ALDACTONE) 25 MG tablet   Irritable bowel syndrome with constipation   Vitamin D deficiency   Essential hypertension   Relevant Medications   atenolol (TENORMIN) 25 MG tablet   spironolactone (ALDACTONE) 25 MG tablet      I discussed the assessment and treatment plan with the patient. The patient was provided an opportunity to ask questions and all were answered. The patient agreed with the plan and demonstrated an understanding of the instructions.  The patient was advised to call back or seek an  in-person evaluation if the symptoms worsen or if the condition fails to improve as anticipated.  I provided 25  minutes of non-face-to-face time during this encounter.

## 2022-08-05 NOTE — Assessment & Plan Note (Signed)
On statin therapy 

## 2022-08-05 NOTE — Assessment & Plan Note (Signed)
Under the care of Dr. Rogue Bussing Denies any symptoms of depression or anxiety Mild nausea but controlled with medication Discussed immunizations

## 2022-08-05 NOTE — Assessment & Plan Note (Signed)
Last LDL at goal, continue Rosuvastatin

## 2022-08-05 NOTE — Assessment & Plan Note (Signed)
DM is at goal, under the care of Dr. Honor Junes  Changing bp medication due to hypokalemia She cannot take ACE/ARB due to angioedema

## 2022-08-06 ENCOUNTER — Other Ambulatory Visit: Payer: Self-pay

## 2022-08-09 MED FILL — Dexamethasone Sodium Phosphate Inj 100 MG/10ML: INTRAMUSCULAR | Qty: 1 | Status: AC

## 2022-08-10 ENCOUNTER — Other Ambulatory Visit: Payer: Self-pay | Admitting: Internal Medicine

## 2022-08-10 ENCOUNTER — Inpatient Hospital Stay (HOSPITAL_BASED_OUTPATIENT_CLINIC_OR_DEPARTMENT_OTHER): Payer: Medicare Other | Admitting: Medical Oncology

## 2022-08-10 ENCOUNTER — Inpatient Hospital Stay: Payer: Medicare Other

## 2022-08-10 VITALS — BP 126/71 | HR 85 | Temp 97.2°F | Resp 18 | Ht 66.0 in | Wt 198.0 lb

## 2022-08-10 DIAGNOSIS — C50811 Malignant neoplasm of overlapping sites of right female breast: Secondary | ICD-10-CM

## 2022-08-10 DIAGNOSIS — R040 Epistaxis: Secondary | ICD-10-CM

## 2022-08-10 DIAGNOSIS — Z5181 Encounter for therapeutic drug level monitoring: Secondary | ICD-10-CM

## 2022-08-10 DIAGNOSIS — Z171 Estrogen receptor negative status [ER-]: Secondary | ICD-10-CM | POA: Diagnosis not present

## 2022-08-10 DIAGNOSIS — Z79899 Other long term (current) drug therapy: Secondary | ICD-10-CM

## 2022-08-10 DIAGNOSIS — C50411 Malignant neoplasm of upper-outer quadrant of right female breast: Secondary | ICD-10-CM

## 2022-08-10 DIAGNOSIS — Z5112 Encounter for antineoplastic immunotherapy: Secondary | ICD-10-CM | POA: Diagnosis not present

## 2022-08-10 LAB — COMPREHENSIVE METABOLIC PANEL
ALT: 18 U/L (ref 0–44)
AST: 18 U/L (ref 15–41)
Albumin: 3.2 g/dL — ABNORMAL LOW (ref 3.5–5.0)
Alkaline Phosphatase: 71 U/L (ref 38–126)
Anion gap: 6 (ref 5–15)
BUN: 8 mg/dL (ref 8–23)
CO2: 27 mmol/L (ref 22–32)
Calcium: 8.3 mg/dL — ABNORMAL LOW (ref 8.9–10.3)
Chloride: 110 mmol/L (ref 98–111)
Creatinine, Ser: 0.69 mg/dL (ref 0.44–1.00)
GFR, Estimated: >60 mL/min (ref >60)
Glucose, Bld: 156 mg/dL — ABNORMAL HIGH (ref 70–99)
Potassium: 3.1 mmol/L — ABNORMAL LOW (ref 3.5–5.1)
Sodium: 143 mmol/L (ref 135–145)
Total Bilirubin: 0.3 mg/dL (ref 0.3–1.2)
Total Protein: 6.2 g/dL — ABNORMAL LOW (ref 6.5–8.1)

## 2022-08-10 LAB — CBC WITH DIFFERENTIAL/PLATELET
Abs Immature Granulocytes: 0.06 10*3/uL (ref 0.00–0.07)
Basophils Absolute: 0 10*3/uL (ref 0.0–0.1)
Basophils Relative: 1 %
Eosinophils Absolute: 0 10*3/uL (ref 0.0–0.5)
Eosinophils Relative: 0 %
HCT: 27.9 % — ABNORMAL LOW (ref 36.0–46.0)
Hemoglobin: 8.7 g/dL — ABNORMAL LOW (ref 12.0–15.0)
Immature Granulocytes: 2 %
Lymphocytes Relative: 43 %
Lymphs Abs: 1.4 10*3/uL (ref 0.7–4.0)
MCH: 28.7 pg (ref 26.0–34.0)
MCHC: 31.2 g/dL (ref 30.0–36.0)
MCV: 92.1 fL (ref 80.0–100.0)
Monocytes Absolute: 0.3 10*3/uL (ref 0.1–1.0)
Monocytes Relative: 8 %
Neutro Abs: 1.5 10*3/uL — ABNORMAL LOW (ref 1.7–7.7)
Neutrophils Relative %: 46 %
Platelets: 265 10*3/uL (ref 150–400)
RBC: 3.03 MIL/uL — ABNORMAL LOW (ref 3.87–5.11)
RDW: 17.4 % — ABNORMAL HIGH (ref 11.5–15.5)
WBC: 3.2 10*3/uL — ABNORMAL LOW (ref 4.0–10.5)
nRBC: 2.2 % — ABNORMAL HIGH (ref 0.0–0.2)

## 2022-08-10 MED ORDER — FAMOTIDINE IN NACL 20-0.9 MG/50ML-% IV SOLN
20.0000 mg | Freq: Once | INTRAVENOUS | Status: AC
  Administered 2022-08-10: 20 mg via INTRAVENOUS
  Filled 2022-08-10: qty 50

## 2022-08-10 MED ORDER — GABAPENTIN 50 MG PO TABS: 50.0000 mg | ORAL_TABLET | Freq: Every day | ORAL | 0 refills | Status: DC

## 2022-08-10 MED ORDER — SODIUM CHLORIDE 0.9 % IV SOLN
Freq: Once | INTRAVENOUS | Status: AC
  Filled 2022-08-10: qty 250

## 2022-08-10 MED ORDER — SODIUM CHLORIDE 0.9 % IV SOLN
10.0000 mg | Freq: Once | INTRAVENOUS | Status: AC
  Administered 2022-08-10: 10 mg via INTRAVENOUS
  Filled 2022-08-10: qty 10

## 2022-08-10 MED ORDER — HEPARIN SOD (PORK) LOCK FLUSH 100 UNIT/ML IV SOLN
500.0000 [IU] | Freq: Once | INTRAVENOUS | Status: AC | PRN
  Administered 2022-08-10: 500 [IU]
  Filled 2022-08-10: qty 5

## 2022-08-10 MED ORDER — SODIUM CHLORIDE 0.9 % IV SOLN
60.0000 mg/m2 | Freq: Once | INTRAVENOUS | Status: AC
  Administered 2022-08-10: 126 mg via INTRAVENOUS
  Filled 2022-08-10: qty 21

## 2022-08-10 MED ORDER — DIPHENHYDRAMINE HCL 50 MG/ML IJ SOLN
50.0000 mg | Freq: Once | INTRAMUSCULAR | Status: AC
  Administered 2022-08-10: 50 mg via INTRAVENOUS
  Filled 2022-08-10: qty 1

## 2022-08-10 NOTE — Patient Instructions (Signed)
[facility]  Discharge Instructions: Thank you for choosing [facility] to provide your oncology and hematology care.  If you have a lab appointment with the Cancer Center, please go directly to the Cancer Center and check in at the registration area.  Wear comfortable clothing and clothing appropriate for easy access to any Portacath or PICC line.   We strive to give you quality time with your provider. You may need to reschedule your appointment if you arrive late (15 or more minutes).  Arriving late affects you and other patients whose appointments are after yours.  Also, if you miss three or more appointments without notifying the office, you may be dismissed from the clinic at the provider's discretion.      For prescription refill requests, have your pharmacy contact our office and allow 72 hours for refills to be completed.    Today you received the following chemotherapy and/or immunotherapy agents Taxol       To help prevent nausea and vomiting after your treatment, we encourage you to take your nausea medication as directed.  BELOW ARE SYMPTOMS THAT SHOULD BE REPORTED IMMEDIATELY: *FEVER GREATER THAN 100.4 F (38 C) OR HIGHER *CHILLS OR SWEATING *NAUSEA AND VOMITING THAT IS NOT CONTROLLED WITH YOUR NAUSEA MEDICATION *UNUSUAL SHORTNESS OF BREATH *UNUSUAL BRUISING OR BLEEDING *URINARY PROBLEMS (pain or burning when urinating, or frequent urination) *BOWEL PROBLEMS (unusual diarrhea, constipation, pain near the anus) TENDERNESS IN MOUTH AND THROAT WITH OR WITHOUT PRESENCE OF ULCERS (sore throat, sores in mouth, or a toothache) UNUSUAL RASH, SWELLING OR PAIN  UNUSUAL VAGINAL DISCHARGE OR ITCHING   Items with * indicate a potential emergency and should be followed up as soon as possible or go to the Emergency Department if any problems should occur.  Please show the CHEMOTHERAPY ALERT CARD or IMMUNOTHERAPY ALERT CARD at check-in to the  Emergency Department and triage nurse.  Should you have questions after your visit or need to cancel or reschedule your appointment, please contact [facility]  [phone] and follow the prompts.  Office hours are 8:00 a.m. to 4:30 p.m. Monday - Friday. Please note that voicemails left after 4:00 p.m. may not be returned until the following business day.  We are closed weekends and major holidays. You have access to a nurse at all times for urgent questions. Please call the main number to the clinic [phone] and follow the prompts.  For any non-urgent questions, you may also contact your provider using MyChart. We now offer e-Visits for anyone 18 and older to request care online for non-urgent symptoms. For details visit [email or web address].   Also download the MyChart app! Go to the app store, search "MyChart", open the app, select , and log in with your MyChart username and password.  Masks are optional in the cancer centers. If you would like for your care team to wear a mask while they are taking care of you, please let them know. For doctor visits, patients may have with them one support person who is at least 71 years old. At this time, visitors are not allowed in the infusion area.   

## 2022-08-10 NOTE — Progress Notes (Signed)
Symptom Management [place] [facility] [phone] [phone]  Patient Care Team: Steele Sizer, MD as PCP - General (Family Medicine) Bary Castilla, Forest Gleason, MD (General Surgery) Carloyn Manner, MD as Referring Physician (Otolaryngology) Yolonda Kida, MD as Consulting Physician (Cardiology) Ree Edman, MD as Referring Physician (Dermatology) Lonia Farber, MD as Consulting Physician (Internal Medicine) Cammie Sickle, MD as Consulting Physician (Internal Medicine)   Name of the patient: Morgan Peterson  [id number]  14-Oct-1951   Date of visit: 08/10/22  Reason for Consult: BAELEIGH DEVINCENT is a 71 y.o. female who is seen by [facility] for:  Neuropathy: Noticed after last Taxol treatment. Affecting feet and hands. Moderate in intensity. She is asking what she can take for this. Has been on gabapentin in the past- unsure what dose-but this made her "Sleepy". Only additional side effect is some dry and occasional bloody nose when she wakes up in the morning. Bought a humidifier but has only used a few times. Nosebleeds are minor and easily controllable. No other bleeding episodes.    Denies any neurologic complaints. Denies recent fevers or illnesses. Denies any easy bleeding or bruising. Reports good appetite and denies weight loss. Denies chest pain. Denies any nausea, vomiting, constipation, or diarrhea. Denies urinary complaints. Patient offers no further specific complaints today.    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 Arimidex  therapy completed September 2015   Mild mitral valve prolapse    Personal history of malignant neoplasm of breast 2010   Personal history of radiation therapy 2010   mammosite   T2DM (type 2 diabetes mellitus) ([place]) 2011    PAST SURGICAL HISTORY:  Past Surgical History:  Procedure Laterality Date   ABDOMINAL EXPLORATION SURGERY  2000   ovarian cyst   BREAST BIOPSY Right 2010   +    BREAST EXCISIONAL BIOPSY Right 2010   + mammo site inasive mammo ca   BREAST LUMPECTOMY Right 2010   [facility]   COLONOSCOPY  07/19/2012   Normal exam, Dr. Bary Castilla   COLONOSCOPY WITH PROPOFOL N/A 04/21/2022   Procedure: COLONOSCOPY WITH PROPOFOL;  Surgeon: Robert Bellow, MD;  Location: [facility] ENDOSCOPY;  Service: Endoscopy;  Laterality: N/A;   MASTECTOMY Right 2010   partial/ lumpectomy   PORTACATH PLACEMENT Left 05/19/2022   Procedure: INSERTION PORT-A-CATH;  Surgeon: Robert Bellow, MD;  Location: [facility] ORS;  Service: General;  Laterality: Left;   SIMPLE MASTECTOMY WITH AXILLARY SENTINEL NODE BIOPSY Right 04/26/2022   Procedure: SIMPLE MASTECTOMY WITH AXILLARY SENTINEL NODE BIOPSY;  Surgeon: Robert Bellow, MD;  Location: [facility] ORS;  Service: General;  Laterality: Right;  RNFA to assist   TUBAL LIGATION  20 years ago    HEMATOLOGY/ONCOLOGY HISTORY:  Oncology History Overview Note  She has a remote history of right breast cancer, T1bN0, ER positive,s/p lumpectomy and MammoSite radiation and 5 years of Arimidex.    02/19/22 screening mammogram bilaterally showed  1. Suspicious right breast mass at the 10 o'clock position 8 cm from the nipple. Recommend ultrasound-guided biopsy. 2. Indeterminate right breast mass at  the 6 o'clock position 4 cm from the nipple. Recommend ultrasound-guided biopsy. 3. No suspicious right axillary lymphadenopathy.   04/12/2022 Unilateral right breast diagnostic mammogram showed 1. Suspicious right breast mass at the 10 o'clock position 8 cm from the nipple. Recommend  ultrasound-guided biopsy. 2. Indeterminate right breast mass at the 6 o'clock position 4 cm from the nipple. Recommend ultrasound-guided biopsy. 3. No suspicious right axillary lymphadenopathy.   04/13/2022 right breast mass 6 o'clock position 1 cm from the nipple showed invasive mammary carcinoma, no special type. Grade 3, DCIS present high grade, LVI not identified. ER-, PR 1-10%, HER 2 -   04/13/2022, right breast mass 10:00 7 cm from nipple biopsy showed invasive mammary carcinoma, grade 3, high-grade DCIS with focal comedonecrosis, lymphovascular invasion not identified.  ER -, PR 1 to 10%, HER2-    04/26/2022, right breast mastectomy with axillary dissection Invasive mammary carcinoma, no special type, multifocal, DCIS high-grade, benign nipple/areola.  2 deposits of invasive mammary carcinoma, no definite residual lymph node identified.   Breast cancer ([place])  05/10/2022 Initial Diagnosis   Breast cancer ([place])   05/10/2022 Cancer Staging   Staging form: Breast, AJCC 8th Edition - Pathologic stage from 05/10/2022: Stage IIA (pT1c, pN1a, cM0, G3, ER-, PR+, HER2-) - Signed by Earlie Server, MD on 05/10/2022 Stage prefix: Initial diagnosis Histologic grading system: 3 grade system   05/24/2022 - 05/24/2022 Chemotherapy   Patient is on Treatment Plan : BREAST Pembrolizumab (200) D1 + Carboplatin (5) D1 + Paclitaxel (80) D1,8,15 q21d X 4 cycles / Pembrolizumab (200) D1 + AC D1 q21d x 4 cycles     05/27/2022 -  Chemotherapy   Patient is on Treatment Plan : BREAST Pembrolizumab (200) D1 + Carboplatin (5) D1 + Paclitaxel (80) D1,8,15 q21d X 4 cycles / Pembrolizumab (200) D1 + AC D1 q21d x 4 cycles     Carcinoma of upper-outer quadrant of right breast in female, estrogen receptor negative ([place])  05/26/2022 Initial Diagnosis   Carcinoma of upper-outer quadrant of right breast in female, estrogen receptor negative ([place])   05/26/2022 Cancer Staging   Staging form: Breast, AJCC 8th Edition - Pathologic: Stage IIA  (pT1c, pN1, cM0, G3, ER-, PR-, HER2-) - Signed by Cammie Sickle, MD on 05/26/2022 Multigene prognostic tests performed: None Histologic grading system: 3 grade system     ALLERGIES:  is allergic to levaquin [levofloxacin in d5w], losartan, metoprolol, amlodipine-olmesartan, canagliflozin, egg white [egg white (egg protein)], gabapentin, glipizide, gluten meal, lisinopril, metformin and related, milk (cow), milk-related compounds, onglyza [saxagliptin], shrimp extract allergy skin test, tramadol, actos [pioglitazone], atorvastatin, carvedilol, and prednisone.  MEDICATIONS:  Current Outpatient Medications  Medication Sig Dispense Refill   aspirin EC 81 MG tablet Take 1 tablet (81 mg total) by mouth daily. 30 tablet 0   atenolol (TENORMIN) 25 MG tablet Take 1 tablet (25 mg total) by mouth daily. 90 tablet 0   azelastine (ASTELIN) 0.1 % nasal spray Place 2 sprays into both nostrils daily as needed for allergies or rhinitis. Use in each nostril as directed     betamethasone, augmented, (DIPROLENE) 0.05 % lotion Apply 1 application topically 2 (two) times daily.     Blood Glucose Monitoring Suppl (ONETOUCH VERIO) w/Device KIT 1 Device by Does not apply route once. 1 kit 0   cholecalciferol (VITAMIN D) 1000 UNITS tablet Take 1,000 Units by mouth daily.     Cyanocobalamin (VITAMIN B-12 PO) Take 1 tablet by mouth daily as needed (fatigue).  EPINEPHrine (EPIPEN 2-PAK) 0.3 mg/0.3 mL IJ SOAJ injection Inject 0.3 mg into the muscle as needed for anaphylaxis. 2 each 0   famotidine (PEPCID) 20 MG tablet Take 20 mg by mouth 2 (two) times daily.     glucose blood (ONETOUCH VERIO) test strip Use as instructed 100 each 12   Lancets (ONETOUCH ULTRASOFT) lancets Use as instructed 100 each 3   levocetirizine (XYZAL) 5 MG tablet Take 1 tablet (5 mg total) by mouth every evening. (Patient taking differently: Take 5 mg by mouth daily as needed for allergies.) 90 tablet 1   lidocaine-prilocaine (EMLA) cream  Apply 1 application. topically as needed. Apply to port and cover with saran wrap 1-2 hours prior to port access 30 g 1   montelukast (SINGULAIR) 10 MG tablet Take 1 tablet (10 mg total) by mouth at bedtime. (Patient taking differently: Take 10 mg by mouth at bedtime as needed (allergies).) 90 tablet 1   Multiple Vitamin (MULTIVITAMIN WITH MINERALS) TABS tablet Take 1 tablet by mouth 2 (two) times a week.     ondansetron (ZOFRAN) 8 MG tablet Take 1 tablet (8 mg total) by mouth every 8 (eight) hours as needed for nausea or vomiting. 40 tablet 1   OZEMPIC, 0.25 OR 0.5 MG/DOSE, 2 MG/1.5ML SOPN Inject 0.5 mg into the skin every Sunday.     prochlorperazine (COMPAZINE) 10 MG tablet Take 1 tablet (10 mg total) by mouth every 6 (six) hours as needed for nausea or vomiting. 40 tablet 1   rizatriptan (MAXALT-MLT) 10 MG disintegrating tablet Take 5-10 mg by mouth as needed for migraine. May repeat in 2 hours if needed     rosuvastatin (CRESTOR) 10 MG tablet Take 1 tablet (10 mg total) by mouth every morning. 90 tablet 1   spironolactone (ALDACTONE) 25 MG tablet Take 1 tablet (25 mg total) by mouth daily. In place of triameterene hctz and potassium 90 tablet 0   No current facility-administered medications for this visit.    VITAL SIGNS: There were no vitals taken for this visit. There were no vitals filed for this visit.  Estimated body mass index is 31.95 kg/m as calculated from the following:   Height as of an earlier encounter on 08/10/22: '5\' 6"'  (1.676 m).   Weight as of an earlier encounter on 08/10/22: 197 lb 15.6 oz (89.8 kg).  LABS: CBC:    Component Value Date/Time   WBC 3.2 (L) 08/10/2022 0927   HGB 8.7 (L) 08/10/2022 0927   HGB 12.4 03/24/2015 1348   HCT 27.9 (L) 08/10/2022 0927   HCT 38.6 03/24/2015 1348   PLT 265 08/10/2022 0927   PLT 250 03/24/2015 1348   MCV 92.1 08/10/2022 0927   MCV 86 03/24/2015 1348   NEUTROABS 1.5 (L) 08/10/2022 0927   NEUTROABS 3.8 03/24/2015 1348    LYMPHSABS 1.4 08/10/2022 0927   LYMPHSABS 2.0 03/24/2015 1348   MONOABS 0.3 08/10/2022 0927   MONOABS 0.3 03/24/2015 1348   EOSABS 0.0 08/10/2022 0927   EOSABS 0.2 03/24/2015 1348   BASOSABS 0.0 08/10/2022 0927   BASOSABS 0.0 03/24/2015 1348   Comprehensive Metabolic Panel:    Component Value Date/Time   NA 143 08/10/2022 0929   NA 141 06/26/2019 0000   NA 137 03/24/2015 1348   K 3.1 (L) 08/10/2022 0929   K 3.5 03/24/2015 1348   CL 110 08/10/2022 0929   CL 101 03/24/2015 1348   CO2 27 08/10/2022 0929   CO2 31 03/24/2015 1348   BUN  8 08/10/2022 0929   BUN 9 06/26/2019 0000   BUN 11 03/24/2015 1348   CREATININE 0.69 08/10/2022 0929   CREATININE 0.81 12/31/2021 1005   GLUCOSE 156 (H) 08/10/2022 0929   GLUCOSE 162 (H) 03/24/2015 1348   CALCIUM 8.3 (L) 08/10/2022 0929   CALCIUM 8.7 (L) 03/24/2015 1348   AST 18 08/10/2022 0929   AST 19 03/24/2015 1348   ALT 18 08/10/2022 0929   ALT 16 03/24/2015 1348   ALKPHOS 71 08/10/2022 0929   ALKPHOS 112 03/24/2015 1348   BILITOT 0.3 08/10/2022 0929   BILITOT 0.4 09/13/2017 1025   BILITOT 0.3 03/24/2015 1348   PROT 6.2 (L) 08/10/2022 0929   PROT 7.0 09/13/2017 1025   PROT 7.2 03/24/2015 1348   ALBUMIN 3.2 (L) 08/10/2022 0929   ALBUMIN 3.9 09/13/2017 1025   ALBUMIN 3.6 03/24/2015 1348    RADIOGRAPHIC STUDIES: No results found.  PERFORMANCE STATUS (ECOG) : 1 - Symptomatic but completely ambulatory  Review of Systems Unless otherwise noted, a complete review of systems is negative.  Physical Exam General: NAD HEENT: No active bleeding, no pallor  Extremities: no edema, no joint deformities Skin: no rashes Neurological: Weakness but otherwise nonfocal  Assessment and Plan- Patient is a 71 y.o. female  Encounter Diagnoses  Name Primary?   Malignant neoplasm of overlapping sites of right breast in female, estrogen receptor negative ([place]) Yes   Encounter for monitoring cardiotoxic drug therapy    Epistaxis    Discussed  with Dr. Rogue Bussing. We have elected to dose reduce her Taxol from 80 mg to 9m to reduce making her neuropathy worse. We discussed gabapentin risks/benefits. This medication is on her allergy list for CNS affects- she reports this caused her sedation but is willing to try at low dose to see if this helps with her neuropathy symptoms. Should sedation occur she will stop this medication. Has someone to monitor her as she trials this medication at low dose. In terms of her nosebleeds I have recommended that she use the humidifier nightly. I have also recommended a gentle coating of vaseline or Aquaphor on a Q tip inside nostrils to help prevent dryness and bleeding. CBC reviewed.     Patient expressed understanding and was in agreement with this plan. She also understands that She can call clinic at any time with any questions, concerns, or complaints.   Thank you for allowing me to participate in the care of this very pleasant patient.   Time Total: 25  Visit consisted of counseling and education dealing with the complex and emotionally intense issues of symptom management in the setting of serious illness.Greater than 50%  of this time was spent counseling and coordinating care related to the above assessment and plan.  Signed by: SNelwyn Salisbury PA-C

## 2022-08-11 ENCOUNTER — Inpatient Hospital Stay: Payer: Medicare Other

## 2022-08-11 DIAGNOSIS — Z5112 Encounter for antineoplastic immunotherapy: Secondary | ICD-10-CM | POA: Diagnosis not present

## 2022-08-11 DIAGNOSIS — C50811 Malignant neoplasm of overlapping sites of right female breast: Secondary | ICD-10-CM

## 2022-08-11 MED ORDER — FILGRASTIM-SNDZ 480 MCG/0.8ML IJ SOSY
480.0000 ug | PREFILLED_SYRINGE | Freq: Once | INTRAMUSCULAR | Status: AC
  Administered 2022-08-11: 480 ug via SUBCUTANEOUS
  Filled 2022-08-11: qty 0.8

## 2022-08-12 ENCOUNTER — Inpatient Hospital Stay: Payer: Medicare Other

## 2022-08-12 DIAGNOSIS — Z171 Estrogen receptor negative status [ER-]: Secondary | ICD-10-CM

## 2022-08-12 DIAGNOSIS — Z5112 Encounter for antineoplastic immunotherapy: Secondary | ICD-10-CM | POA: Diagnosis not present

## 2022-08-12 MED ORDER — FILGRASTIM-SNDZ 480 MCG/0.8ML IJ SOSY
480.0000 ug | PREFILLED_SYRINGE | Freq: Once | INTRAMUSCULAR | Status: AC
  Administered 2022-08-12: 480 ug via SUBCUTANEOUS
  Filled 2022-08-12: qty 0.8

## 2022-08-13 ENCOUNTER — Other Ambulatory Visit: Payer: Self-pay | Admitting: *Deleted

## 2022-08-13 ENCOUNTER — Other Ambulatory Visit: Payer: Self-pay

## 2022-08-13 ENCOUNTER — Inpatient Hospital Stay: Payer: Medicare Other

## 2022-08-13 DIAGNOSIS — Z171 Estrogen receptor negative status [ER-]: Secondary | ICD-10-CM

## 2022-08-13 DIAGNOSIS — Z5112 Encounter for antineoplastic immunotherapy: Secondary | ICD-10-CM | POA: Diagnosis not present

## 2022-08-13 MED ORDER — GABAPENTIN 100 MG PO CAPS: 100.0000 mg | ORAL_CAPSULE | Freq: Every day | ORAL | 0 refills | Status: DC

## 2022-08-13 MED ORDER — FILGRASTIM-SNDZ 480 MCG/0.8ML IJ SOSY
480.0000 ug | PREFILLED_SYRINGE | Freq: Once | INTRAMUSCULAR | Status: AC
  Administered 2022-08-13: 480 ug via SUBCUTANEOUS
  Filled 2022-08-13: qty 0.8

## 2022-08-13 MED ORDER — GABAPENTIN 50 MG PO TABS: 100.0000 mg | ORAL_TABLET | Freq: Every day | ORAL | 0 refills | Status: DC

## 2022-08-13 NOTE — Telephone Encounter (Signed)
Called and spoke with [facility] to clarify prescription. Pharmacist states the lowest dose of Gabapentin is 100 mg. Informed Sarah of new dosage to be sent to [facility]. Judson Roch verbalized understanding and will send correct rx.

## 2022-08-13 NOTE — Telephone Encounter (Signed)
Morgan Peterson, I sent a new script for 100 mg take once daily earlier per [facility] request. Its in her medication list and verified they received it.

## 2022-08-13 NOTE — Telephone Encounter (Signed)
Patient called asking for the status of getting her medicine for numbness and tingling (Gabapentin 50 mg)  She said it had to get approval and she has heard nothing since hearing that. Please advise

## 2022-08-13 NOTE — Telephone Encounter (Signed)
Please see previous message, 100 mg will be sent to [facility]

## 2022-08-13 NOTE — Telephone Encounter (Signed)
Sarah, please sign. Thanks you

## 2022-08-13 NOTE — Telephone Encounter (Signed)
Thank you :)

## 2022-08-16 ENCOUNTER — Inpatient Hospital Stay (HOSPITAL_BASED_OUTPATIENT_CLINIC_OR_DEPARTMENT_OTHER): Payer: Medicare Other | Admitting: Internal Medicine

## 2022-08-16 ENCOUNTER — Encounter: Payer: Self-pay | Admitting: Internal Medicine

## 2022-08-16 ENCOUNTER — Ambulatory Visit: Payer: Medicare Other

## 2022-08-16 ENCOUNTER — Inpatient Hospital Stay: Payer: Medicare Other

## 2022-08-16 VITALS — BP 156/90 | HR 78 | Temp 98.8°F | Resp 18 | Ht 66.0 in | Wt 196.2 lb

## 2022-08-16 DIAGNOSIS — Z171 Estrogen receptor negative status [ER-]: Secondary | ICD-10-CM | POA: Diagnosis not present

## 2022-08-16 DIAGNOSIS — Z5112 Encounter for antineoplastic immunotherapy: Secondary | ICD-10-CM | POA: Diagnosis not present

## 2022-08-16 DIAGNOSIS — C50411 Malignant neoplasm of upper-outer quadrant of right female breast: Secondary | ICD-10-CM

## 2022-08-16 DIAGNOSIS — C50811 Malignant neoplasm of overlapping sites of right female breast: Secondary | ICD-10-CM

## 2022-08-16 LAB — COMPREHENSIVE METABOLIC PANEL
ALT: 18 U/L (ref 0–44)
AST: 21 U/L (ref 15–41)
Albumin: 3.3 g/dL — ABNORMAL LOW (ref 3.5–5.0)
Alkaline Phosphatase: 80 U/L (ref 38–126)
Anion gap: 6 (ref 5–15)
BUN: 15 mg/dL (ref 8–23)
CO2: 24 mmol/L (ref 22–32)
Calcium: 8.2 mg/dL — ABNORMAL LOW (ref 8.9–10.3)
Chloride: 108 mmol/L (ref 98–111)
Creatinine, Ser: 0.65 mg/dL (ref 0.44–1.00)
GFR, Estimated: >60 mL/min (ref >60)
Glucose, Bld: 185 mg/dL — ABNORMAL HIGH (ref 70–99)
Potassium: 3.7 mmol/L (ref 3.5–5.1)
Sodium: 138 mmol/L (ref 135–145)
Total Bilirubin: 0.4 mg/dL (ref 0.3–1.2)
Total Protein: 6.2 g/dL — ABNORMAL LOW (ref 6.5–8.1)

## 2022-08-16 LAB — CBC WITH DIFFERENTIAL/PLATELET
Abs Immature Granulocytes: 1.84 10*3/uL — ABNORMAL HIGH (ref 0.00–0.07)
Basophils Absolute: 0 10*3/uL (ref 0.0–0.1)
Basophils Relative: 0 %
Eosinophils Absolute: 0 10*3/uL (ref 0.0–0.5)
Eosinophils Relative: 0 %
HCT: 28.1 % — ABNORMAL LOW (ref 36.0–46.0)
Hemoglobin: 8.8 g/dL — ABNORMAL LOW (ref 12.0–15.0)
Immature Granulocytes: 18 %
Lymphocytes Relative: 19 %
Lymphs Abs: 2 10*3/uL (ref 0.7–4.0)
MCH: 29 pg (ref 26.0–34.0)
MCHC: 31.3 g/dL (ref 30.0–36.0)
MCV: 92.7 fL (ref 80.0–100.0)
Monocytes Absolute: 1.1 10*3/uL — ABNORMAL HIGH (ref 0.1–1.0)
Monocytes Relative: 11 %
Neutro Abs: 5.3 10*3/uL (ref 1.7–7.7)
Neutrophils Relative %: 52 %
Platelets: 248 10*3/uL (ref 150–400)
RBC: 3.03 MIL/uL — ABNORMAL LOW (ref 3.87–5.11)
RDW: 18.4 % — ABNORMAL HIGH (ref 11.5–15.5)
Smear Review: NORMAL
WBC: 10.3 10*3/uL (ref 4.0–10.5)
nRBC: 2.7 % — ABNORMAL HIGH (ref 0.0–0.2)

## 2022-08-16 MED ORDER — HEPARIN SOD (PORK) LOCK FLUSH 100 UNIT/ML IV SOLN
500.0000 [IU] | Freq: Once | INTRAVENOUS | Status: AC
  Administered 2022-08-16: 500 [IU] via INTRAVENOUS
  Filled 2022-08-16: qty 5

## 2022-08-16 NOTE — Progress Notes (Signed)
Patient has noticed a decrease in her energy in the past 3 days.    Recent med change from Triamterene and potassium to Spironolactone.    Tolerating Gabapentin that was started a few days ago for neuropathy.

## 2022-08-16 NOTE — Progress Notes (Signed)
[facility] CONSULT NOTE  Patient Care Team: Sowles, [name], MD as PCP - General (Family Medicine) [name], Jeffrey W, MD (General Surgery) Vaught, Creighton, MD as Referring Physician (Otolaryngology) Callwood, Dwayne D, MD as Consulting Physician (Cardiology) Benitez-Graham, Ana, MD as Referring Physician (Dermatology) O'Connell, Thomas L, MD as Consulting Physician (Internal Medicine) Mickenzie Stolar R, MD as Consulting Physician (Internal Medicine)  CHIEF COMPLAINTS/PURPOSE OF CONSULTATION: Breast cancer  #  Oncology History Overview Note  She has a remote history of right breast cancer, T1bN0, ER positive,s/p lumpectomy and MammoSite radiation and 5 years of Arimidex.    02/19/22 screening mammogram bilaterally showed  1. Suspicious right breast mass at the 10 o'clock position 8 cm from the nipple. Recommend ultrasound-guided biopsy. 2. Indeterminate right breast mass at the 6 o'clock position 4 cm from the nipple. Recommend ultrasound-guided biopsy. 3. No suspicious right axillary lymphadenopathy.   04/12/2022 Unilateral right breast diagnostic mammogram showed 1. Suspicious right breast mass at the 10 o'clock position 8 cm from the nipple. Recommend ultrasound-guided biopsy. 2. Indeterminate right breast mass at the 6 o'clock position 4 cm from the nipple. Recommend ultrasound-guided biopsy. 3. No suspicious right axillary lymphadenopathy.   04/13/2022 right breast mass 6 o'clock position 1 cm from the nipple showed invasive mammary carcinoma, no special type. Grade 3, DCIS present high grade, LVI not identified. ER-, PR 1-10%, HER 2 -   04/13/2022, right breast mass 10:00 7 cm from nipple biopsy showed invasive mammary carcinoma, grade 3, high-grade DCIS with focal comedonecrosis, lymphovascular invasion not identified.  ER -, PR 1 to 10%, HER2-    04/26/2022, right breast mastectomy with axillary dissection Invasive mammary carcinoma, no special type, multifocal, DCIS  high-grade, benign nipple/areola.  2 deposits of invasive mammary carcinoma, no definite residual lymph node identified.   Breast cancer (HCC)  05/10/2022 Initial Diagnosis   Breast cancer (HCC)   05/10/2022 Cancer Staging   Staging form: Breast, AJCC 8th Edition - Pathologic stage from 05/10/2022: Stage IIA (pT1c, pN1a, cM0, G3, ER-, PR+, HER2-) - Signed by Yu, Zhou, MD on 05/10/2022 Stage prefix: Initial diagnosis Histologic grading system: 3 grade system   05/24/2022 - 05/24/2022 Chemotherapy   Patient is on Treatment Plan : BREAST Pembrolizumab (200) D1 + Carboplatin (5) D1 + Paclitaxel (80) D1,8,15 q21d X 4 cycles / Pembrolizumab (200) D1 + AC D1 q21d x 4 cycles     05/27/2022 - 08/13/2022 Chemotherapy   Patient is on Treatment Plan : BREAST Pembrolizumab (200) D1 + Carboplatin (5) D1 + Paclitaxel (80) D1,8,15 q21d X 4 cycles / Pembrolizumab (200) D1 + AC D1 q21d x 4 cycles     05/27/2022 -  Chemotherapy   Patient is on Treatment Plan : BREAST Pembrolizumab (200) D1 + Carboplatin (5) D1 + Paclitaxel (80) D1,8,15 q21d X 4 cycles / Pembrolizumab (200) D1 + AC D1 q21d x 4 cycles     Carcinoma of upper-outer quadrant of right breast in female, estrogen receptor negative (HCC)  05/26/2022 Initial Diagnosis   Carcinoma of upper-outer quadrant of right breast in female, estrogen receptor negative (HCC)   05/26/2022 Cancer Staging   Staging form: Breast, AJCC 8th Edition - Pathologic: Stage IIA (pT1c, pN1, cM0, G3, ER-, PR-, HER2-) - Signed by Adreonna Yontz R, MD on 05/26/2022 Multigene prognostic tests performed: None Histologic grading system: 3 grade system   05/27/2022 -  Chemotherapy   Patient is on Treatment Plan : BREAST Pembrolizumab (200) D1 + Carboplatin (5) D1 + Paclitaxel (80)   D1,8,15 q21d X 4 cycles / Pembrolizumab (200) D1 + AC D1 q21d x 4 cycles       HISTORY OF PRESENTING ILLNESS: Ambulating independently.  Alone.  [name] 71 y.o.  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  Patient admits to mild-moderate tingling and numbness extremities.  Patient started on gabapentin.  Improved.  Not resolved.  Denies any nausea vomiting.  Appetite is good.  No weight loss.  Complains of ongoing fatigue.  Review of Systems  Constitutional:  Positive for malaise/fatigue. Negative for chills, diaphoresis, fever and weight loss.  HENT:  Negative for nosebleeds and sore throat.   Eyes:  Negative for double vision.  Respiratory:  Negative for cough, hemoptysis, sputum production, shortness of breath and wheezing.   Cardiovascular:  Negative for chest pain, palpitations, orthopnea and leg swelling.  Gastrointestinal:  Negative for abdominal pain, blood in stool, constipation, diarrhea, heartburn, melena, nausea and vomiting.  Genitourinary:  Negative for dysuria, frequency and urgency.  Musculoskeletal:  Negative for back pain and joint pain.  Skin: Negative.  Negative for itching and rash.  Neurological:  Positive for tingling. Negative for dizziness, focal weakness, weakness and headaches.  Endo/Heme/Allergies:  Does not bruise/bleed easily.  Psychiatric/Behavioral:  Negative for depression. The patient is not nervous/anxious and does not have insomnia.      MEDICAL HISTORY:  Past Medical History:  Diagnosis Date   Cervical radiculopathy    Hiatal hernia    Hypercholesterolemia    Hyperlipidemia    Hypertension    IBS (irritable bowel syndrome)    Malignant neoplasm of upper-outer quadrant of female breast ([place]) 04/11/2009   Right breast, invasive ductal carcinoma, 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 Arimidex therapy completed September 2015   Mild mitral valve prolapse    Personal history of malignant neoplasm of breast 2010   Personal history of radiation therapy 2010    mammosite   T2DM (type 2 diabetes mellitus) ([place]) 2011    SURGICAL HISTORY: Past Surgical History:  Procedure Laterality Date   ABDOMINAL EXPLORATION SURGERY  2000   ovarian cyst   BREAST BIOPSY Right 2010   +    BREAST EXCISIONAL BIOPSY Right 2010   + mammo site inasive mammo ca   BREAST LUMPECTOMY Right 2010   [facility]   COLONOSCOPY  07/19/2012   Normal exam, Dr. Bary Castilla   COLONOSCOPY WITH PROPOFOL N/A 04/21/2022   Procedure: COLONOSCOPY WITH PROPOFOL;  Surgeon: Robert Bellow, MD;  Location: [facility] ENDOSCOPY;  Service: Endoscopy;  Laterality: N/A;   MASTECTOMY Right 2010   partial/ lumpectomy   PORTACATH PLACEMENT Left 05/19/2022   Procedure: INSERTION PORT-A-CATH;  Surgeon: Robert Bellow, MD;  Location: [facility] ORS;  Service: General;  Laterality: Left;   SIMPLE MASTECTOMY WITH AXILLARY SENTINEL NODE BIOPSY Right 04/26/2022   Procedure: SIMPLE MASTECTOMY WITH AXILLARY SENTINEL NODE BIOPSY;  Surgeon: Robert Bellow, MD;  Location: [facility] ORS;  Service: General;  Laterality: Right;  RNFA to assist   TUBAL LIGATION  20 years ago    SOCIAL HISTORY: Social History   Socioeconomic History   Marital status: Married    Spouse name: Antonio   Number of children: 2   Years of education: Not on file   Highest education level: Not on file  Occupational History   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  Tobacco Use   Smoking status: Former    Packs/day: 0.25    Years: 10.00    Total pack years: 2.50    Types: Cigarettes    Start date: 12    Quit date: 1988    Years since quitting: 35.6   Smokeless tobacco: Never   Tobacco comments:    smoking cessation materials not required  Vaping Use   Vaping Use: Never used  Substance and Sexual Activity   Alcohol use: Yes    Comment: wine occ   Drug use: No   Sexual activity: Not Currently    Comment: husband has ED  Other Topics Concern   Not on file  Social History Narrative   Oncology nurse on the floor retired.;   Husband retired from [employer].  No smoking.   Social Determinants of Health   Financial Resource Strain: Low Risk  (12/31/2021)   Overall Financial Resource Strain (CARDIA)    Difficulty of Paying Living Expenses: Not hard at all  Food Insecurity: No Food Insecurity (12/31/2021)   Hunger Vital Sign    Worried About Running Out of Food in the Last Year: Never true    Ran Out of Food in the Last Year: Never true  Transportation Needs: No Transportation Needs (12/31/2021)   PRAPARE - Hydrologist (Medical): No    Lack of Transportation (Non-Medical): No  Physical Activity: Inactive (12/31/2021)   Exercise Vital Sign    Days of Exercise per Week: 0 days    Minutes of Exercise per Session: 0 min  Stress: No Stress Concern Present (12/31/2021)   [place]    Feeling of Stress : Only a little  Social Connections: Moderately Integrated (12/31/2021)   Social Connection and Isolation Panel [NHANES]    Frequency of Communication with Friends and Family: More than three times a week    Frequency of Social Gatherings with Friends and Family: Three times a week    Attends Religious Services: More than 4 times per year    Active Member of Clubs or Organizations: No    Attends Archivist Meetings: Never    Marital Status: Married  Human resources officer Violence: Not At Risk (12/31/2021)   Humiliation, Afraid, Rape, and Kick questionnaire    Fear of Current or Ex-Partner: No    Emotionally Abused: No    Physically Abused: No    Sexually Abused: No    FAMILY HISTORY: Family History  Problem Relation Age of Onset   Osteoporosis Mother    Diabetes Father    Kidney disease Father    Diabetes Brother    Breast cancer Maternal Aunt    Bladder Cancer Maternal Aunt    Cervical cancer Maternal Aunt    Colon cancer Maternal Uncle    Lung cancer Maternal Uncle     ALLERGIES:  is allergic to levaquin  [levofloxacin in d5w], losartan, metoprolol, amlodipine-olmesartan, canagliflozin, egg white [egg white (egg protein)], gabapentin, glipizide, gluten meal, lisinopril, metformin and related, milk (cow), milk-related compounds, onglyza [saxagliptin], shrimp extract allergy skin test, tramadol, actos [pioglitazone], atorvastatin, carvedilol, and prednisone.  MEDICATIONS:  Current Outpatient Medications  Medication Sig Dispense Refill   aspirin EC 81 MG tablet Take 1 tablet (81 mg total) by mouth daily. 30 tablet 0   atenolol (TENORMIN) 25 MG tablet Take 1 tablet (25 mg total) by mouth daily. 90 tablet 0   azelastine (ASTELIN) 0.1 %  nasal spray Place 2 sprays into both nostrils daily as needed for allergies or rhinitis. Use in each nostril as directed     betamethasone, augmented, (DIPROLENE) 0.05 % lotion Apply 1 application topically 2 (two) times daily.     Blood Glucose Monitoring Suppl (ONETOUCH VERIO) w/Device KIT 1 Device by Does not apply route once. 1 kit 0   cholecalciferol (VITAMIN D) 1000 UNITS tablet Take 1,000 Units by mouth daily.     Cyanocobalamin (VITAMIN B-12 PO) Take 1 tablet by mouth daily as needed (fatigue).     EPINEPHrine (EPIPEN 2-PAK) 0.3 mg/0.3 mL IJ SOAJ injection Inject 0.3 mg into the muscle as needed for anaphylaxis. 2 each 0   famotidine (PEPCID) 20 MG tablet Take 20 mg by mouth 2 (two) times daily.     gabapentin (NEURONTIN) 100 MG capsule Take 1 capsule (100 mg total) by mouth daily. 30 capsule 0   glucose blood (ONETOUCH VERIO) test strip Use as instructed 100 each 12   Lancets (ONETOUCH ULTRASOFT) lancets Use as instructed 100 each 3   levocetirizine (XYZAL) 5 MG tablet Take 1 tablet (5 mg total) by mouth every evening. (Patient taking differently: Take 5 mg by mouth daily as needed for allergies.) 90 tablet 1   lidocaine-prilocaine (EMLA) cream Apply 1 application. topically as needed. Apply to port and cover with saran wrap 1-2 hours prior to port access 30 g 1    montelukast (SINGULAIR) 10 MG tablet Take 1 tablet (10 mg total) by mouth at bedtime. (Patient taking differently: Take 10 mg by mouth at bedtime as needed (allergies).) 90 tablet 1   Multiple Vitamin (MULTIVITAMIN WITH MINERALS) TABS tablet Take 1 tablet by mouth 2 (two) times a week.     ondansetron (ZOFRAN) 8 MG tablet Take 1 tablet (8 mg total) by mouth every 8 (eight) hours as needed for nausea or vomiting. 40 tablet 1   OZEMPIC, 0.25 OR 0.5 MG/DOSE, 2 MG/1.5ML SOPN Inject 0.5 mg into the skin every Sunday.     prochlorperazine (COMPAZINE) 10 MG tablet Take 1 tablet (10 mg total) by mouth every 6 (six) hours as needed for nausea or vomiting. 40 tablet 1   rizatriptan (MAXALT-MLT) 10 MG disintegrating tablet Take 5-10 mg by mouth as needed for migraine. May repeat in 2 hours if needed     rosuvastatin (CRESTOR) 10 MG tablet Take 1 tablet (10 mg total) by mouth every morning. 90 tablet 1   spironolactone (ALDACTONE) 25 MG tablet Take 1 tablet (25 mg total) by mouth daily. In place of triameterene hctz and potassium 90 tablet 0   No current facility-administered medications for this visit.      Marland Kitchen  PHYSICAL EXAMINATION: ECOG PERFORMANCE STATUS: 0 - Asymptomatic  Vitals:   08/16/22 1000  BP: (!) 156/90  Pulse: 78  Resp: 18  Temp: 98.8 F (37.1 C)   Filed Weights   08/16/22 1000  Weight: 196 lb 3.2 oz (89 kg)    Physical Exam Vitals and nursing note reviewed.  HENT:     Head: Normocephalic and atraumatic.     Mouth/Throat:     Pharynx: Oropharynx is clear.  Eyes:     Extraocular Movements: Extraocular movements intact.     Pupils: Pupils are equal, round, and reactive to light.  Cardiovascular:     Rate and Rhythm: Normal rate and regular rhythm.  Pulmonary:     Comments: Decreased breath sounds bilaterally.  Abdominal:     Palpations: Abdomen is soft.  Musculoskeletal:        General: Normal range of motion.     Cervical back: Normal range of motion.  Skin:     General: Skin is warm.  Neurological:     General: No focal deficit present.     Mental Status: She is alert and oriented to person, place, and time.  Psychiatric:        Behavior: Behavior normal.        Judgment: Judgment normal.      LABORATORY DATA:  I have reviewed the data as listed Lab Results  Component Value Date   WBC 10.3 08/16/2022   HGB 8.8 (L) 08/16/2022   HCT 28.1 (L) 08/16/2022   MCV 92.7 08/16/2022   PLT 248 08/16/2022   Recent Labs    08/03/22 0903 08/10/22 0929 08/16/22 0930  NA 137 143 138  K 3.0* 3.1* 3.7  CL 102 110 108  CO2 _0 GLUCOSE 160* 156* 185*  BUN _1 CREATININE 0.79 0.69 0.65  CALCIUM 8.8* 8.3* 8.2*  GFRNONAA >60 >60 >60  PROT 6.8 6.2* 6.2*  ALBUMIN 3.4* 3.2* 3.3*  AST _2 ALT _3 ALKPHOS 77 71 80  BILITOT 0.2* 0.3 0.4    RADIOGRAPHIC STUDIES: I have personally reviewed the radiological images as listed and agreed with the findings in the report. No results found.  ASSESSMENT & PLAN:   Carcinoma of upper-outer quadrant of right breast in female, estrogen receptor negative ([place]) # Stage II triple negative breast cancer [mT1cN1; Grade 3 ]- s/p mastectomy.  Patient currently on adjuvant chemoimmunotherapy [KEYNOTE 522 study]  # HOLD ccyle #4-Adriamcycin- Cytoxan- Keytruda- moderate anemia/fatigue- June 6th- ECHO- 05/25/2022- 65-70%.   # Electrolyte abonormalities- hypocalcemia- 8.2; albumin- 3.2 [egg allergy]- recommend increasing protein intake.   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   # DISPOSITION: # HOLD chemo tomorrow; DE-ACCESS  # Follow up MD- in 2 weeks-;labs- cbc/cmp; vit D 25-OH levels;; D-2 Adriamycin-Cytoxan--keytruda; next day- udenyca- Dr.B      All questions were answered. The patient/family knows to call the clinic with any problems, questions or concerns.       GCammie Sickle MD 08/16/2022 5:49 PM

## 2022-08-16 NOTE — Assessment & Plan Note (Addendum)
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  Patient currently on adjuvant chemoimmunotherapy [KEYNOTE 522 study]  # HOLD ccyle #4-Adriamcycin- Cytoxan- Keytruda- moderate anemia/fatigue- June 6th- ECHO- 05/25/2022- 65-70%.   # Electrolyte abonormalities- hypocalcemia- 8.2; albumin- 3.2 [egg allergy]- recommend increasing protein intake.   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   # DISPOSITION: # HOLD chemo tomorrow; DE-ACCESS  # Follow up MD- in 2 weeks-;labs- cbc/cmp; vit D 25-OH levels;; D-2 Adriamycin-Cytoxan--keytruda; next day- udenyca- Dr.B

## 2022-08-17 ENCOUNTER — Inpatient Hospital Stay: Payer: Medicare Other

## 2022-08-20 ENCOUNTER — Ambulatory Visit: Payer: Self-pay

## 2022-08-20 NOTE — Telephone Encounter (Signed)
  Chief Complaint: Pt asked to call in report of BP and any changes since stopped HCTZ.  Symptoms: swollen ankles Frequency: started after stopped med Pertinent Negatives: Patient denies leg swelling. Disposition: '[]'$ ED /'[]'$ Urgent Care (no appt availability in office) / '[]'$ Appointment(In office/virtual)/ '[]'$  [facility]/ '[]'$ Home Care/ '[]'$ Refused Recommended Disposition /'[]'$ [facility]/ '[x]'$  Follow-up with PCP Additional Notes: Pt states she was to keep record of BP and call back to let know how she is after stopping the HCTZ. BP readings 150/90, 125/71, 153/83, and 144/72. Her ankles are swollen. She would like a new medication for this.   Reason for Disposition  [1] Localized swelling (e.g., small area of puffy or swollen skin) AND [2] itchy  Answer Assessment - Initial Assessment Questions 1. LOCATION: "Which ankle is swollen?" "Where is the swelling?"     Both ankles 2. ONSET: "When did the swelling start?"     Started when stopped HCTZ 3. SWELLING: "How bad is the swelling?" Or, "How large is it?" (e.g., mild, moderate, severe; size of localized swelling)    - NONE: No joint swelling.   - LOCALIZED: Localized; small area of puffy or swollen skin (e.g., insect bite, skin irritation).   - MILD: Joint looks or feels mildly swollen or puffy.   - MODERATE: Swollen; interferes with normal activities (e.g., work or school); decreased range of movement; may be limping.   - SEVERE: Very swollen; can't move swollen joint at all; limping a lot or unable to walk.     She says 2+ edema in ankles, feet, legs are just tight 4. PAIN: "Is there any pain?" If Yes, ask: "How bad is it?" (Scale 1-10; or mild, moderate, severe)   - NONE (0): no pain.   - MILD (1-3): doesn't interfere with normal activities.    - MODERATE (4-7): interferes with normal activities (e.g., work or school) or awakens from sleep, limping.    - SEVERE (8-10): excruciating pain, unable to do any normal activities,  unable to walk.      no 5. CAUSE: "What do you think caused the ankle swelling?"     Being off HCTZ 6. OTHER SYMPTOMS: "Do you have any other symptoms?" (e.g., fever, chest pain, difficulty breathing, calf pain)     no 7. PREGNANCY: "Is there any chance you are pregnant?" "When was your last menstrual period?"     no  Protocols used: Ankle Swelling-A-AH

## 2022-08-20 NOTE — Telephone Encounter (Signed)
Patient called in stating she has been experiencing edema in her feet and ankles since she has been of the HCTZ. It was depleting her potassium and that is why she came off of it. Her potassium has gone up to 3.7 from 3.1 since being on the other medication spironolactone. She thinks she still needs to be on another diuretic of some kind though. The patient uses   [id number] [place] [address]  Phone: [phone] Fax: [phone]  Left message to call back.

## 2022-08-24 NOTE — Telephone Encounter (Signed)
If you are willing to overbook? I will call her back when you agree?

## 2022-08-24 NOTE — Telephone Encounter (Signed)
Called pt to get her on the scheudle. Pt states she is not comfortable  coming into the office as long as she is doing chemo. Pt states she did a virtual on 08/05/22 . I told her I would forward this message to be advised on what to do and I would call her back

## 2022-08-25 NOTE — Telephone Encounter (Signed)
Pt is scheduled for Friday @ 3:40. She was ok with this time

## 2022-08-26 NOTE — Progress Notes (Signed)
Name: Morgan Peterson   MRN: [id number]    DOB: 09/05/1951   Date:08/27/2022       Progress Note  Subjective  Chief Complaint  Consult  HPI  HTN: she used to take triamterene hctz but was having hypokalemia even while taking potassium, we switched to spironolactone and potassium is back to normal but she is noticing swelling on both ankles. We will adjust dose of spironolactone to 50 mg and monitor. 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 but stopped due to tingling in her tongue, is now going to start Duloxetine   Hives: secondary to food allergy , she needs a refill of epipen  Patient Active Problem List   Diagnosis Date Noted   Food allergy 08/05/2022   Atherosclerosis of both carotid arteries 08/05/2022   Migraine aura without headache 08/05/2022   Carcinoma of upper-outer quadrant of right breast in female, estrogen receptor negative (HCC) 05/26/2022   Breast cancer (HCC) 05/10/2022   Goals of care, counseling/discussion 05/10/2022   Encounter for monitoring cardiotoxic drug therapy 05/10/2022   Carotid stenosis 04/21/2022   Hypertension associated with type 2 diabetes mellitus (HCC) 01/20/2021   Intermittent low back pain 08/18/2017   Irritable bowel syndrome with constipation 05/02/2017   Vitamin D deficiency 09/09/2016   Hiatal hernia 06/02/2016   Allergic rhinitis, seasonal 06/02/2016   Obesity (BMI 30-39.9) 04/30/2016   Angio-edema 04/30/2016   Hyperlipidemia 11/04/2015   Hyperlipidemia due to type 2 diabetes mellitus (HCC) 11/04/2015   Cervical radiculopathy at C6 07/31/2015   Neuroma digital nerve 07/31/2015   Dyslipidemia associated with type 2 diabetes mellitus (HCC) 06/30/2015   Cervical disc disease 06/30/2015   Essential hypertension 06/30/2015   History of breast cancer in female 03/23/2009    Past Surgical History:  Procedure Laterality Date   ABDOMINAL EXPLORATION SURGERY   2000   ovarian cyst   BREAST BIOPSY Right 2010   +    BREAST EXCISIONAL BIOPSY Right 2010   + mammo site inasive mammo ca   BREAST LUMPECTOMY Right 2010   [facility]   COLONOSCOPY  07/19/2012   Normal exam, Dr. [name]   COLONOSCOPY WITH PROPOFOL N/A 04/21/2022   Procedure: COLONOSCOPY WITH PROPOFOL;  Surgeon: [name], Jeffrey W, MD;  Location: [facility];  Service: Endoscopy;  Laterality: N/A;   MASTECTOMY Right 2010   partial/ lumpectomy   PORTACATH PLACEMENT Left 05/19/2022   Procedure: INSERTION PORT-A-CATH;  Surgeon: [name], Jeffrey W, MD;  Location: [facility];  Service: General;  Laterality: Left;   SIMPLE MASTECTOMY WITH AXILLARY SENTINEL NODE BIOPSY Right 04/26/2022   Procedure: SIMPLE MASTECTOMY WITH AXILLARY SENTINEL NODE BIOPSY;  Surgeon: [name], Jeffrey W, MD;  Location: [facility];  Service: General;  Laterality: Right;  RNFA to assist   TUBAL LIGATION  20 years ago    Family History  Problem Relation Age of Onset   Osteoporosis Mother    Diabetes Father    Kidney disease Father    Diabetes Brother    Breast cancer Maternal Aunt    Bladder Cancer Maternal Aunt    Cervical cancer Maternal Aunt    Colon cancer Maternal Uncle    Lung cancer Maternal Uncle     Social History   Tobacco Use   Smoking status: Former    Packs/day: 0.25    Years: 10.00    Total pack years: 2.50    Types: Cigarettes    Start   date: 38    Quit date: 16    Years since quitting: 35.7   Smokeless tobacco: Never   Tobacco comments:    smoking cessation materials not required  Substance Use Topics   Alcohol use: Yes    Comment: wine occ     Current Outpatient Medications:    aspirin EC 81 MG tablet, Take 1 tablet (81 mg total) by mouth daily., Disp: 30 tablet, Rfl: 0   atenolol (TENORMIN) 25 MG tablet, Take 1 tablet (25 mg total) by mouth daily., Disp: 90 tablet, Rfl: 0   azelastine (ASTELIN) 0.1 % nasal spray, Place 2 sprays into both nostrils daily as needed for allergies or  rhinitis. Use in each nostril as directed, Disp: , Rfl:    betamethasone, augmented, (DIPROLENE) 0.05 % lotion, Apply 1 application topically 2 (two) times daily., Disp: , Rfl:    Blood Glucose Monitoring Suppl (ONETOUCH VERIO) w/Device KIT, 1 Device by Does not apply route once., Disp: 1 kit, Rfl: 0   cholecalciferol (VITAMIN D) 1000 UNITS tablet, Take 1,000 Units by mouth daily., Disp: , Rfl:    Cyanocobalamin (VITAMIN B-12 PO), Take 1 tablet by mouth daily as needed (fatigue)., Disp: , Rfl:    DULoxetine (CYMBALTA) 20 MG capsule, Take 1 capsule (20 mg total) by mouth daily., Disp: 30 capsule, Rfl: 3   EPINEPHrine (EPIPEN 2-PAK) 0.3 mg/0.3 mL IJ SOAJ injection, Inject 0.3 mg into the muscle as needed for anaphylaxis., Disp: 2 each, Rfl: 0   famotidine (PEPCID) 20 MG tablet, Take 20 mg by mouth 2 (two) times daily., Disp: , Rfl:    glucose blood (ONETOUCH VERIO) test strip, Use as instructed, Disp: 100 each, Rfl: 12   Lancets (ONETOUCH ULTRASOFT) lancets, Use as instructed, Disp: 100 each, Rfl: 3   levocetirizine (XYZAL) 5 MG tablet, Take 1 tablet (5 mg total) by mouth every evening. (Patient taking differently: Take 5 mg by mouth daily as needed for allergies.), Disp: 90 tablet, Rfl: 1   lidocaine-prilocaine (EMLA) cream, Apply 1 application. topically as needed. Apply to port and cover with saran wrap 1-2 hours prior to port access, Disp: 30 g, Rfl: 1   montelukast (SINGULAIR) 10 MG tablet, Take 1 tablet (10 mg total) by mouth at bedtime. (Patient taking differently: Take 10 mg by mouth at bedtime as needed (allergies).), Disp: 90 tablet, Rfl: 1   Multiple Vitamin (MULTIVITAMIN WITH MINERALS) TABS tablet, Take 1 tablet by mouth 2 (two) times a week., Disp: , Rfl:    ondansetron (ZOFRAN) 8 MG tablet, Take 1 tablet (8 mg total) by mouth every 8 (eight) hours as needed for nausea or vomiting., Disp: 40 tablet, Rfl: 1   OZEMPIC, 0.25 OR 0.5 MG/DOSE, 2 MG/1.5ML SOPN, Inject 0.5 mg into the skin every  Sunday., Disp: , Rfl:    prochlorperazine (COMPAZINE) 10 MG tablet, Take 1 tablet (10 mg total) by mouth every 6 (six) hours as needed for nausea or vomiting., Disp: 40 tablet, Rfl: 1   rizatriptan (MAXALT-MLT) 10 MG disintegrating tablet, Take 5-10 mg by mouth as needed for migraine. May repeat in 2 hours if needed, Disp: , Rfl:    rosuvastatin (CRESTOR) 10 MG tablet, Take 1 tablet (10 mg total) by mouth every morning., Disp: 90 tablet, Rfl: 1   spironolactone (ALDACTONE) 25 MG tablet, Take 1 tablet (25 mg total) by mouth daily. In place of triameterene hctz and potassium, Disp: 90 tablet, Rfl: 0  Allergies  Allergen Reactions   Levaquin [Levofloxacin In  D5w] Swelling    Other reaction(s): Arthralgia (Joint Pain)   Losartan     Other reaction(s): Angioedema   Metoprolol Swelling    Lip and tongue tingling and numbness   Amlodipine-Olmesartan    Canagliflozin Other (See Comments)    Bladder pain   Egg White [Egg White (Egg Protein)]     Unknown reaction   Gabapentin     cns side effects.   Glipizide     Other reaction(s): Abdominal pain   Gluten Meal    Lisinopril Swelling    Angioedema   Metformin And Related Nausea Only   Milk (Cow)    Milk-Related Compounds    Onglyza [Saxagliptin] Other (See Comments)    Increased PVC's   Shrimp Extract Allergy Skin Test     Other reaction(s): Other (See Comments)   Tramadol Nausea And Vomiting   Actos [Pioglitazone] Other (See Comments) and Nausea And Vomiting    Bladder pain   Atorvastatin Other (See Comments)    Other reaction(s): Abdominal Pain, Other (See Comments)   Carvedilol Other (See Comments)    NUMBNESS, TINGLING, ACHING IN ARMS   Prednisone Palpitations    I personally reviewed active problem list, medication list, allergies, family history, social history, health maintenance with the patient/caregiver today.   ROS  Ten systems reviewed and is negative except as mentioned in HPI   Objective  Vitals:   08/27/22  1532  BP: (!) 142/88  Pulse: 95  Resp: 16  Temp: 98 F (36.7 C)  TempSrc: Oral  SpO2: 99%  Weight: 198 lb 12.8 oz (90.2 kg)  Height: 5' 6" (1.676 m)    Body mass index is 32.09 kg/m.  Physical Exam  Constitutional: Patient appears well-developed and well-nourished. Obese  No distress.  HEENT: head atraumatic, normocephalic, pupils equal and reactive to light, neck supple Cardiovascular: Normal rate, regular rhythm and normal heart sounds.  No murmur heard. No BLE edema. Pulmonary/Chest: Effort normal and breath sounds normal. No respiratory distress. Abdominal: Soft.  There is no tenderness. Psychiatric: Patient has a normal mood and affect. behavior is normal. Judgment and thought content normal.   PHQ2/9:    08/27/2022    3:35 PM 08/05/2022   11:37 AM 01/11/2022    3:05 PM 12/31/2021    9:16 AM 12/24/2021   11:43 AM  Depression screen PHQ 2/9  Decreased Interest 0 0 0 0 0  Down, Depressed, Hopeless 0 0 0 0 0  PHQ - 2 Score 0 0 0 0 0  Altered sleeping 0 0  0   Tired, decreased energy 1 3  0   Change in appetite 0 0  0   Feeling bad or failure about yourself  0 0  0   Trouble concentrating 0 0  0   Moving slowly or fidgety/restless 0 0  0   Suicidal thoughts 0 0  0   PHQ-9 Score 1 3  0     phq 9 is negative   Fall Risk:    08/27/2022    3:35 PM 08/05/2022   11:36 AM 01/11/2022    3:05 PM 12/31/2021    9:16 AM 12/24/2021   11:45 AM  Fall Risk   Falls in the past year? 0 0 0 0 0  Number falls in past yr:  0 0 0 0  Injury with Fall?  0 0 0 0  Risk for fall due to : No Fall Risks No Fall Risks  No Fall Risks No Fall  Risks  Follow up Falls prevention discussed;Education provided;Falls evaluation completed Falls prevention discussed Falls evaluation completed Falls prevention discussed Falls prevention discussed      Functional Status Survey: Is the patient deaf or have difficulty hearing?: No Does the patient have difficulty seeing, even when wearing  glasses/contacts?: No Does the patient have difficulty concentrating, remembering, or making decisions?: No Does the patient have difficulty walking or climbing stairs?: No Does the patient have difficulty dressing or bathing?: No Does the patient have difficulty doing errands alone such as visiting a doctor's office or shopping?: No    Assessment & Plan  1. Essential hypertension  - spironolactone (ALDACTONE) 50 MG tablet; Take 1 tablet (50 mg total) by mouth daily.  Dispense: 90 tablet; Refill: 0  2. Bilateral lower extremity edema   3. Hives  - EPINEPHrine (EPIPEN 2-PAK) 0.3 mg/0.3 mL IJ SOAJ injection; Inject 0.3 mg into the muscle as needed for anaphylaxis.  Dispense: 2 each; Refill: 0  4. Chemotherapy-induced neuropathy ([place])  She will start duloxetine

## 2022-08-27 ENCOUNTER — Encounter: Payer: Self-pay | Admitting: Family Medicine

## 2022-08-27 ENCOUNTER — Ambulatory Visit (INDEPENDENT_AMBULATORY_CARE_PROVIDER_SITE_OTHER): Payer: Medicare Other | Admitting: Family Medicine

## 2022-08-27 ENCOUNTER — Telehealth: Payer: Self-pay | Admitting: *Deleted

## 2022-08-27 ENCOUNTER — Inpatient Hospital Stay: Payer: Medicare Other | Attending: Oncology | Admitting: Hospice and Palliative Medicine

## 2022-08-27 VITALS — BP 142/88 | HR 95 | Temp 98.0°F | Resp 16 | Ht 66.0 in | Wt 198.8 lb

## 2022-08-27 DIAGNOSIS — T451X5A Adverse effect of antineoplastic and immunosuppressive drugs, initial encounter: Secondary | ICD-10-CM

## 2022-08-27 DIAGNOSIS — G620 Drug-induced polyneuropathy: Secondary | ICD-10-CM

## 2022-08-27 DIAGNOSIS — C50811 Malignant neoplasm of overlapping sites of right female breast: Secondary | ICD-10-CM

## 2022-08-27 DIAGNOSIS — Z5112 Encounter for antineoplastic immunotherapy: Secondary | ICD-10-CM | POA: Insufficient documentation

## 2022-08-27 DIAGNOSIS — Z923 Personal history of irradiation: Secondary | ICD-10-CM | POA: Insufficient documentation

## 2022-08-27 DIAGNOSIS — Z171 Estrogen receptor negative status [ER-]: Secondary | ICD-10-CM | POA: Diagnosis not present

## 2022-08-27 DIAGNOSIS — R112 Nausea with vomiting, unspecified: Secondary | ICD-10-CM | POA: Insufficient documentation

## 2022-08-27 DIAGNOSIS — Z87891 Personal history of nicotine dependence: Secondary | ICD-10-CM | POA: Insufficient documentation

## 2022-08-27 DIAGNOSIS — I10 Essential (primary) hypertension: Secondary | ICD-10-CM | POA: Diagnosis not present

## 2022-08-27 DIAGNOSIS — L509 Urticaria, unspecified: Secondary | ICD-10-CM | POA: Diagnosis not present

## 2022-08-27 DIAGNOSIS — Z9221 Personal history of antineoplastic chemotherapy: Secondary | ICD-10-CM | POA: Insufficient documentation

## 2022-08-27 DIAGNOSIS — R110 Nausea: Secondary | ICD-10-CM | POA: Insufficient documentation

## 2022-08-27 DIAGNOSIS — Z7982 Long term (current) use of aspirin: Secondary | ICD-10-CM | POA: Insufficient documentation

## 2022-08-27 DIAGNOSIS — Z9011 Acquired absence of right breast and nipple: Secondary | ICD-10-CM | POA: Insufficient documentation

## 2022-08-27 DIAGNOSIS — Z5111 Encounter for antineoplastic chemotherapy: Secondary | ICD-10-CM | POA: Insufficient documentation

## 2022-08-27 DIAGNOSIS — Z79899 Other long term (current) drug therapy: Secondary | ICD-10-CM | POA: Insufficient documentation

## 2022-08-27 DIAGNOSIS — E1142 Type 2 diabetes mellitus with diabetic polyneuropathy: Secondary | ICD-10-CM | POA: Insufficient documentation

## 2022-08-27 DIAGNOSIS — R600 Localized edema: Secondary | ICD-10-CM

## 2022-08-27 DIAGNOSIS — D649 Anemia, unspecified: Secondary | ICD-10-CM | POA: Insufficient documentation

## 2022-08-27 MED ORDER — SPIRONOLACTONE 50 MG PO TABS: 50.0000 mg | ORAL_TABLET | Freq: Every day | ORAL | 0 refills | Status: DC

## 2022-08-27 MED ORDER — EPINEPHRINE 0.3 MG/0.3ML IJ SOAJ: 0.3000 mg | INTRAMUSCULAR | 0 refills | Status: DC | PRN

## 2022-08-27 MED ORDER — DULOXETINE HCL 20 MG PO CPEP: 20.0000 mg | ORAL_CAPSULE | Freq: Every day | ORAL | 3 refills | Status: DC

## 2022-08-27 NOTE — Telephone Encounter (Signed)
Patient called reporting that she thinks she is allergic to Gabapentin. She reports that she has noticed that her tip of her tongue starts tingling 30 minutes after taking it. She at first thought it was a strawberry banana smoothie so she stopped drinking those but the tingling continued and she noted the sensation at timing of Gabapentin dose. She is asking what to do. Please advise

## 2022-08-27 NOTE — Progress Notes (Signed)
Virtual Visit via Telephone Note  I connected with Morgan Peterson on 08/27/22 at  2:40 PM EDT by telephone and verified that I am speaking with the correct person using two identifiers.  Location: Patient: Home Provider: Clinic   I discussed the limitations, risks, security and privacy concerns of performing an evaluation and management service by telephone and the availability of in person appointments. I also discussed with the patient that there may be a patient responsible charge related to this service. The patient expressed understanding and agreed to proceed.   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  Chemotherapy is currently on hold due to anemia and fatigue.   Observations/Objective: Patient is noted to have grade 1-2 peripheral neuropathy from previous Taxol.  She was started on gabapentin 100 mg nightly but called to report that she felt like this was causing tingling sensation to her tongue about 30 minutes after administration.  She has subsequently self discontinued the gabapentin and feels like she might be allergic.  Additionally, patient reports that she did not feel the gabapentin was helping her neuropathy.  Patient describes numbness in hands and feet but denies tingling or burning pain.  No other symptomatic complaints or concerns at present.  Assessment and Plan: CIPN -discussed with Dr. Rogue Bussing and will discontinue gabapentin and start low-dose duloxetine.  Discussed with patient that is ineffective, she could consider trial of acupuncture.  She thinks her chiropractor (Dr. Rock Nephew) offers acupuncture services.  Another option could be referral to Dr. Mickeal Skinner for evaluation as patient has multiple medication intolerances.  Follow Up Instructions: As  previously scheduled   I discussed the assessment and treatment plan with the patient. The patient was provided an opportunity to ask questions and all were answered. The patient agreed with the plan and demonstrated an understanding of the instructions.   The patient was advised to call back or seek an in-person evaluation if the symptoms worsen or if the condition fails to improve as anticipated.  I provided 5 minutes of non-face-to-face time during this encounter.   Irean Hong, NP

## 2022-08-27 NOTE — Telephone Encounter (Signed)
Per Dr Rogue Bussing ok to hold off gabapentin; instead sarah if you could talk to her re: cymbalta- 20 mg instead- Thanks.  Nelwyn Salisbury, PA has requested patient be added on for Virtual visit this afternoon.

## 2022-08-30 MED FILL — Fosaprepitant Dimeglumine For IV Infusion 150 MG (Base Eq): INTRAVENOUS | Qty: 5 | Status: AC

## 2022-08-30 MED FILL — Dexamethasone Sodium Phosphate Inj 100 MG/10ML: INTRAMUSCULAR | Qty: 1 | Status: AC

## 2022-08-31 ENCOUNTER — Inpatient Hospital Stay: Payer: Medicare Other

## 2022-08-31 ENCOUNTER — Inpatient Hospital Stay (HOSPITAL_BASED_OUTPATIENT_CLINIC_OR_DEPARTMENT_OTHER): Payer: Medicare Other | Admitting: Internal Medicine

## 2022-08-31 ENCOUNTER — Encounter: Payer: Self-pay | Admitting: Internal Medicine

## 2022-08-31 VITALS — BP 137/65 | HR 79 | Temp 97.3°F | Ht 66.0 in | Wt 195.4 lb

## 2022-08-31 DIAGNOSIS — C50411 Malignant neoplasm of upper-outer quadrant of right female breast: Secondary | ICD-10-CM

## 2022-08-31 DIAGNOSIS — R112 Nausea with vomiting, unspecified: Secondary | ICD-10-CM | POA: Diagnosis not present

## 2022-08-31 DIAGNOSIS — Z5112 Encounter for antineoplastic immunotherapy: Secondary | ICD-10-CM | POA: Diagnosis present

## 2022-08-31 DIAGNOSIS — Z171 Estrogen receptor negative status [ER-]: Secondary | ICD-10-CM

## 2022-08-31 DIAGNOSIS — C50811 Malignant neoplasm of overlapping sites of right female breast: Secondary | ICD-10-CM

## 2022-08-31 DIAGNOSIS — Z9011 Acquired absence of right breast and nipple: Secondary | ICD-10-CM | POA: Diagnosis not present

## 2022-08-31 DIAGNOSIS — Z923 Personal history of irradiation: Secondary | ICD-10-CM | POA: Diagnosis not present

## 2022-08-31 DIAGNOSIS — D649 Anemia, unspecified: Secondary | ICD-10-CM | POA: Diagnosis not present

## 2022-08-31 DIAGNOSIS — R110 Nausea: Secondary | ICD-10-CM | POA: Diagnosis not present

## 2022-08-31 DIAGNOSIS — Z5111 Encounter for antineoplastic chemotherapy: Secondary | ICD-10-CM | POA: Diagnosis present

## 2022-08-31 DIAGNOSIS — E1142 Type 2 diabetes mellitus with diabetic polyneuropathy: Secondary | ICD-10-CM | POA: Diagnosis not present

## 2022-08-31 DIAGNOSIS — Z9221 Personal history of antineoplastic chemotherapy: Secondary | ICD-10-CM | POA: Diagnosis not present

## 2022-08-31 DIAGNOSIS — Z87891 Personal history of nicotine dependence: Secondary | ICD-10-CM | POA: Diagnosis not present

## 2022-08-31 DIAGNOSIS — Z79899 Other long term (current) drug therapy: Secondary | ICD-10-CM | POA: Diagnosis not present

## 2022-08-31 DIAGNOSIS — Z7982 Long term (current) use of aspirin: Secondary | ICD-10-CM | POA: Diagnosis not present

## 2022-08-31 LAB — CBC WITH DIFFERENTIAL/PLATELET
Abs Immature Granulocytes: 0.01 10*3/uL (ref 0.00–0.07)
Basophils Absolute: 0 10*3/uL (ref 0.0–0.1)
Basophils Relative: 0 %
Eosinophils Absolute: 0 10*3/uL (ref 0.0–0.5)
Eosinophils Relative: 0 %
HCT: 31.4 % — ABNORMAL LOW (ref 36.0–46.0)
Hemoglobin: 9.8 g/dL — ABNORMAL LOW (ref 12.0–15.0)
Immature Granulocytes: 0 %
Lymphocytes Relative: 31 %
Lymphs Abs: 1.6 10*3/uL (ref 0.7–4.0)
MCH: 29.6 pg (ref 26.0–34.0)
MCHC: 31.2 g/dL (ref 30.0–36.0)
MCV: 94.9 fL (ref 80.0–100.0)
Monocytes Absolute: 0.4 10*3/uL (ref 0.1–1.0)
Monocytes Relative: 8 %
Neutro Abs: 3.1 10*3/uL (ref 1.7–7.7)
Neutrophils Relative %: 61 %
Platelets: 277 10*3/uL (ref 150–400)
RBC: 3.31 MIL/uL — ABNORMAL LOW (ref 3.87–5.11)
RDW: 18 % — ABNORMAL HIGH (ref 11.5–15.5)
WBC: 5.1 10*3/uL (ref 4.0–10.5)
nRBC: 0 % (ref 0.0–0.2)

## 2022-08-31 LAB — COMPREHENSIVE METABOLIC PANEL
ALT: 16 U/L (ref 0–44)
AST: 22 U/L (ref 15–41)
Albumin: 3.5 g/dL (ref 3.5–5.0)
Alkaline Phosphatase: 88 U/L (ref 38–126)
Anion gap: 6 (ref 5–15)
BUN: 11 mg/dL (ref 8–23)
CO2: 25 mmol/L (ref 22–32)
Calcium: 9.1 mg/dL (ref 8.9–10.3)
Chloride: 106 mmol/L (ref 98–111)
Creatinine, Ser: 0.87 mg/dL (ref 0.44–1.00)
GFR, Estimated: >60 mL/min (ref >60)
Glucose, Bld: 170 mg/dL — ABNORMAL HIGH (ref 70–99)
Potassium: 3.9 mmol/L (ref 3.5–5.1)
Sodium: 137 mmol/L (ref 135–145)
Total Bilirubin: 0.5 mg/dL (ref 0.3–1.2)
Total Protein: 6.8 g/dL (ref 6.5–8.1)

## 2022-08-31 LAB — VITAMIN D 25 HYDROXY (VIT D DEFICIENCY, FRACTURES): Vit D, 25-Hydroxy: 40.51 ng/mL (ref 30–100)

## 2022-08-31 MED ORDER — SODIUM CHLORIDE 0.9 % IV SOLN
600.0000 mg/m2 | Freq: Once | INTRAVENOUS | Status: AC
  Administered 2022-08-31: 1220 mg via INTRAVENOUS
  Filled 2022-08-31: qty 61

## 2022-08-31 MED ORDER — DOXORUBICIN HCL CHEMO IV INJECTION 2 MG/ML
60.0000 mg/m2 | Freq: Once | INTRAVENOUS | Status: AC
  Administered 2022-08-31: 122 mg via INTRAVENOUS
  Filled 2022-08-31: qty 61

## 2022-08-31 MED ORDER — PALONOSETRON HCL INJECTION 0.25 MG/5ML
0.2500 mg | Freq: Once | INTRAVENOUS | Status: AC
  Administered 2022-08-31: 0.25 mg via INTRAVENOUS
  Filled 2022-08-31: qty 5

## 2022-08-31 MED ORDER — DEXAMETHASONE SODIUM PHOSPHATE 10 MG/ML IJ SOLN
6.0000 mg | Freq: Once | INTRAMUSCULAR | Status: AC
  Administered 2022-08-31: 6 mg via INTRAVENOUS
  Filled 2022-08-31: qty 1

## 2022-08-31 MED ORDER — HEPARIN SOD (PORK) LOCK FLUSH 100 UNIT/ML IV SOLN
500.0000 [IU] | Freq: Once | INTRAVENOUS | Status: AC | PRN
  Administered 2022-08-31: 500 [IU]
  Filled 2022-08-31: qty 5

## 2022-08-31 MED ORDER — SODIUM CHLORIDE 0.9 % IV SOLN
Freq: Once | INTRAVENOUS | Status: AC
  Filled 2022-08-31: qty 250

## 2022-08-31 MED ORDER — SODIUM CHLORIDE 0.9 % IV SOLN: 6.0000 mg | Freq: Once | INTRAVENOUS | Status: DC

## 2022-08-31 MED ORDER — SODIUM CHLORIDE 0.9 % IV SOLN
150.0000 mg | Freq: Once | INTRAVENOUS | Status: AC
  Administered 2022-08-31: 150 mg via INTRAVENOUS
  Filled 2022-08-31: qty 150

## 2022-08-31 MED ORDER — SODIUM CHLORIDE 0.9 % IV SOLN
200.0000 mg | Freq: Once | INTRAVENOUS | Status: AC
  Administered 2022-08-31: 200 mg via INTRAVENOUS
  Filled 2022-08-31: qty 8

## 2022-08-31 NOTE — Assessment & Plan Note (Addendum)
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  Patient currently on adjuvant chemoimmunotherapy [[id number] study]; patient s/p carboplatin weekly Taxol.  Currently on Adriamycin-Cytoxan Keytruda.   # Proceed with cycle #1 Adriamcycin- Cytoxan- Keytruda- moderate anemia/fatigue- June 6th- ECHO- 05/25/2022- 65-70%. TSH AUG 2023-WNL.   # Electrolyte abonormalities- hypocalcemia- 8.2-.  9.2 improved.-Monitor for now  # Peripheral neuropathy-grade 1-2 ; from Taxol- declined to start [place] on accupuncture-improved/stable.  # DM-we will cut down the steroids to 6 mg.  Blood sugars today 170 [Orange juice]- on ozempic.  Monitor blood sugars closely.  # DISPOSITION: # Adriamycin-cytoxan-Keytruda today  # Follow up MD- in 3 weeks-;labs- cbc/cmp;Adriamycin-Cytoxan--keytruda; next day- udenyca- Dr.B

## 2022-08-31 NOTE — Progress Notes (Signed)
[facility] CONSULT NOTE  Patient Care Team: Sowles, [name], MD as PCP - General (Family Medicine) [name], Jeffrey W, MD (General Surgery) Vaught, Creighton, MD as Referring Physician (Otolaryngology) Callwood, Dwayne D, MD as Consulting Physician (Cardiology) Benitez-Graham, Ana, MD as Referring Physician (Dermatology) O'Connell, Thomas L, MD as Consulting Physician (Internal Medicine) Demitrious Mccannon R, MD as Consulting Physician (Internal Medicine)  CHIEF COMPLAINTS/PURPOSE OF CONSULTATION: Breast cancer  #  Oncology History Overview Note  She has a remote history of right breast cancer, T1bN0, ER positive,s/p lumpectomy and MammoSite radiation and 5 years of Arimidex.    02/19/22 screening mammogram bilaterally showed  1. Suspicious right breast mass at the 10 o'clock position 8 cm from the nipple. Recommend ultrasound-guided biopsy. 2. Indeterminate right breast mass at the 6 o'clock position 4 cm from the nipple. Recommend ultrasound-guided biopsy. 3. No suspicious right axillary lymphadenopathy.   04/12/2022 Unilateral right breast diagnostic mammogram showed 1. Suspicious right breast mass at the 10 o'clock position 8 cm from the nipple. Recommend ultrasound-guided biopsy. 2. Indeterminate right breast mass at the 6 o'clock position 4 cm from the nipple. Recommend ultrasound-guided biopsy. 3. No suspicious right axillary lymphadenopathy.   04/13/2022 right breast mass 6 o'clock position 1 cm from the nipple showed invasive mammary carcinoma, no special type. Grade 3, DCIS present high grade, LVI not identified. ER-, PR 1-10%, HER 2 -   04/13/2022, right breast mass 10:00 7 cm from nipple biopsy showed invasive mammary carcinoma, grade 3, high-grade DCIS with focal comedonecrosis, lymphovascular invasion not identified.  ER -, PR 1 to 10%, HER2-    04/26/2022, right breast mastectomy with axillary dissection Invasive mammary carcinoma, no special type, multifocal, DCIS  high-grade, benign nipple/areola.  2 deposits of invasive mammary carcinoma, no definite residual lymph node identified.   Breast cancer (HCC)  05/10/2022 Initial Diagnosis   Breast cancer (HCC)   05/10/2022 Cancer Staging   Staging form: Breast, AJCC 8th Edition - Pathologic stage from 05/10/2022: Stage IIA (pT1c, pN1a, cM0, G3, ER-, PR+, HER2-) - Signed by Yu, Zhou, MD on 05/10/2022 Stage prefix: Initial diagnosis Histologic grading system: 3 grade system   05/24/2022 - 05/24/2022 Chemotherapy   Patient is on Treatment Plan : BREAST Pembrolizumab (200) D1 + Carboplatin (5) D1 + Paclitaxel (80) D1,8,15 q21d X 4 cycles / Pembrolizumab (200) D1 + AC D1 q21d x 4 cycles     05/27/2022 - 08/13/2022 Chemotherapy   Patient is on Treatment Plan : BREAST Pembrolizumab (200) D1 + Carboplatin (5) D1 + Paclitaxel (80) D1,8,15 q21d X 4 cycles / Pembrolizumab (200) D1 + AC D1 q21d x 4 cycles     05/27/2022 -  Chemotherapy   Patient is on Treatment Plan : BREAST Pembrolizumab (200) D1 + Carboplatin (5) D1 + Paclitaxel (80) D1,8,15 q21d X 4 cycles / Pembrolizumab (200) D1 + AC D1 q21d x 4 cycles     Carcinoma of upper-outer quadrant of right breast in female, estrogen receptor negative (HCC)  05/26/2022 Initial Diagnosis   Carcinoma of upper-outer quadrant of right breast in female, estrogen receptor negative (HCC)   05/26/2022 Cancer Staging   Staging form: Breast, AJCC 8th Edition - Pathologic: Stage IIA (pT1c, pN1, cM0, G3, ER-, PR-, HER2-) - Signed by Vita Currin R, MD on 05/26/2022 Multigene prognostic tests performed: None Histologic grading system: 3 grade system   05/27/2022 -  Chemotherapy   Patient is on Treatment Plan : BREAST Pembrolizumab (200) D1 + Carboplatin (5) D1 + Paclitaxel (80)   D1,8,15 q21d X 4 cycles / Pembrolizumab (200) D1 + AC D1 q21d x 4 cycles       HISTORY OF PRESENTING ILLNESS: Ambulating independently.  Alone.  Morgan Peterson 71 y.o.  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  Patient's chemotherapy cycle #1 Adriamycin Cytoxan Beryle Flock was held 2 weeks ago because of fatigue/anemia.  Patient in the interim noted to have neuropathy for which she was started on gabapentin.  However patient noted to have tingling and numbness along her tongue on the gabapentin.  Patient was discontinued.  Patient recommended to start Cymbalta-however she declined given the concern for side effects.   Denies any nausea vomiting.  Appetite is good.  No weight loss.  Complains of ongoing fatigue-improved.  Started taking iron tablets..  Review of Systems  Constitutional:  Positive for malaise/fatigue. Negative for chills, diaphoresis, fever and weight loss.  HENT:  Negative for nosebleeds and sore throat.   Eyes:  Negative for double vision.  Respiratory:  Negative for cough, hemoptysis, sputum production, shortness of breath and wheezing.   Cardiovascular:  Negative for chest pain, palpitations, orthopnea and leg swelling.  Gastrointestinal:  Negative for abdominal pain, blood in stool, constipation, diarrhea, heartburn, melena, nausea and vomiting.  Genitourinary:  Negative for dysuria, frequency and urgency.  Musculoskeletal:  Negative for back pain and joint pain.  Skin: Negative.  Negative for itching and rash.  Neurological:  Positive for tingling. Negative for dizziness, focal weakness, weakness and headaches.  Endo/Heme/Allergies:  Does not bruise/bleed easily.  Psychiatric/Behavioral:  Negative for depression. The patient is not nervous/anxious and does not have insomnia.      MEDICAL HISTORY:  Past Medical History:  Diagnosis Date  . Cervical radiculopathy   . Hiatal hernia   . Hypercholesterolemia   . Hyperlipidemia   . Hypertension   . IBS (irritable bowel syndrome)   . Malignant neoplasm of upper-outer quadrant of female  breast ([place]) 04/11/2009   Right breast, invasive ductal carcinoma, 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 Arimidex therapy completed September 2015  . Mild mitral valve prolapse   . Personal history of malignant neoplasm of breast 2010  . Personal history of radiation therapy 2010   mammosite  . T2DM (type 2 diabetes mellitus) ([place]) 2011    SURGICAL HISTORY: Past Surgical History:  Procedure Laterality Date  . ABDOMINAL EXPLORATION SURGERY  2000   ovarian cyst  . BREAST BIOPSY Right 2010   +   . BREAST EXCISIONAL BIOPSY Right 2010   + mammo site inasive mammo ca  . BREAST LUMPECTOMY Right 2010   [place]  . COLONOSCOPY  07/19/2012   Normal exam, Dr. Bary Castilla  . COLONOSCOPY WITH PROPOFOL N/A 04/21/2022   Procedure: COLONOSCOPY WITH PROPOFOL;  Surgeon: Robert Bellow, MD;  Location: [facility] ENDOSCOPY;  Service: Endoscopy;  Laterality: N/A;  . MASTECTOMY Right 2010   partial/ lumpectomy  . PORTACATH PLACEMENT Left 05/19/2022   Procedure: INSERTION PORT-A-CATH;  Surgeon: Robert Bellow, MD;  Location: [facility] ORS;  Service: General;  Laterality: Left;  . SIMPLE MASTECTOMY WITH AXILLARY SENTINEL NODE BIOPSY Right 04/26/2022   Procedure: SIMPLE MASTECTOMY WITH AXILLARY SENTINEL NODE BIOPSY;  Surgeon: Robert Bellow, MD;  Location: [facility] ORS;  Service: General;  Laterality: Right;  RNFA to assist  . TUBAL LIGATION  20 years ago  SOCIAL HISTORY: Social History   Socioeconomic History  . Marital status: Married    Spouse name: Antonio  . Number of children: 2  . Years of education: Not on file  . Highest education level: Not on file  Occupational History  . 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 Smoking status: Former    Packs/day: 0.25    Years: 10.00    Total pack years: 2.50    Types: Cigarettes    Start date: 57    Quit date: 1988    Years since quitting: 35.7  . Smokeless tobacco: Never  .  Tobacco comments:    smoking cessation materials not required  Vaping Use  . Vaping Use: Never used  Substance and Sexual Activity  . Alcohol use: Yes    Comment: wine occ  . Drug use: No  . Sexual activity: Not Currently    Comment: husband has ED  Other Topics Concern  . Not on file  Social History Narrative   Oncology nurse on the floor retired.;  Husband retired from [employer].  No smoking.   Social Determinants of Health   Financial Resource Strain: Low Risk  (12/31/2021)   Overall Financial Resource Strain (CARDIA)   . Difficulty of Paying Living Expenses: Not hard at all  Food Insecurity: No Food Insecurity (12/31/2021)   Hunger Vital Sign   . Worried About Charity fundraiser in the Last Year: Never true   . Ran Out of Food in the Last Year: Never true  Transportation Needs: No Transportation Needs (12/31/2021)   PRAPARE - Transportation   . Lack of Transportation (Medical): No   . Lack of Transportation (Non-Medical): No  Physical Activity: Inactive (12/31/2021)   Exercise Vital Sign   . Days of Exercise per Week: 0 days   . Minutes of Exercise per Session: 0 min  Stress: No Stress Concern Present (12/31/2021)   [place]   . Feeling of Stress : Only a little  Social Connections: Moderately Integrated (12/31/2021)   Social Connection and Isolation Panel [NHANES]   . Frequency of Communication with Friends and Family: More than three times a week   . Frequency of Social Gatherings with Friends and Family: Three times a week   . Attends Religious Services: More than 4 times per year   . Active Member of Clubs or Organizations: No   . Attends Archivist Meetings: Never   . Marital Status: Married  Human resources officer Violence: Not At Risk (12/31/2021)   Humiliation, Afraid, Rape, and Kick questionnaire   . Fear of Current or Ex-Partner: No   . Emotionally Abused: No   . Physically Abused: No   .  Sexually Abused: No    FAMILY HISTORY: Family History  Problem Relation Age of Onset  . Osteoporosis Mother   . Diabetes Father   . Kidney disease Father   . Diabetes Brother   . Breast cancer Maternal Aunt   . Bladder Cancer Maternal Aunt   . Cervical cancer Maternal Aunt   . Colon cancer Maternal Uncle   . Lung cancer Maternal Uncle     ALLERGIES:  is allergic to levaquin [levofloxacin in d5w], losartan, metoprolol, amlodipine-olmesartan, canagliflozin, egg white [egg white (egg protein)], gabapentin, glipizide, gluten meal, lisinopril, metformin and related, milk (cow), milk-related compounds, onglyza [saxagliptin], shrimp extract allergy skin test, tramadol, actos [pioglitazone], atorvastatin, carvedilol, and prednisone.  MEDICATIONS:  Current Outpatient Medications  Medication Sig Dispense Refill  . aspirin EC 81 MG tablet Take 1 tablet (81 mg total) by mouth daily. 30 tablet 0  . atenolol (TENORMIN) 25 MG tablet Take 1 tablet (25 mg total) by mouth daily. 90 tablet 0  . azelastine (ASTELIN) 0.1 % nasal spray Place 2 sprays into both nostrils daily as needed for allergies or rhinitis. Use in each nostril as directed    . betamethasone, augmented, (DIPROLENE) 0.05 % lotion Apply 1 application topically 2 (two) times daily.    . Blood Glucose Monitoring Suppl (ONETOUCH VERIO) w/Device KIT 1 Device by Does not apply route once. 1 kit 0  . cholecalciferol (VITAMIN D) 1000 UNITS tablet Take 1,000 Units by mouth daily.    . Cyanocobalamin (VITAMIN B-12 PO) Take 1 tablet by mouth daily as needed (fatigue).    . DULoxetine (CYMBALTA) 20 MG capsule Take 1 capsule (20 mg total) by mouth daily. 30 capsule 3  . EPINEPHrine (EPIPEN 2-PAK) 0.3 mg/0.3 mL IJ SOAJ injection Inject 0.3 mg into the muscle as needed for anaphylaxis. 2 each 0  . famotidine (PEPCID) 20 MG tablet Take 20 mg by mouth 2 (two) times daily.    Marland Kitchen glucose blood (ONETOUCH VERIO) test strip Use as instructed 100 each 12  .  Lancets (ONETOUCH ULTRASOFT) lancets Use as instructed 100 each 3  . levocetirizine (XYZAL) 5 MG tablet Take 1 tablet (5 mg total) by mouth every evening. (Patient taking differently: Take 5 mg by mouth daily as needed for allergies.) 90 tablet 1  . lidocaine-prilocaine (EMLA) cream Apply 1 application. topically as needed. Apply to port and cover with saran wrap 1-2 hours prior to port access 30 g 1  . montelukast (SINGULAIR) 10 MG tablet Take 1 tablet (10 mg total) by mouth at bedtime. (Patient taking differently: Take 10 mg by mouth at bedtime as needed (allergies).) 90 tablet 1  . Multiple Vitamin (MULTIVITAMIN WITH MINERALS) TABS tablet Take 1 tablet by mouth 2 (two) times a week.    . ondansetron (ZOFRAN) 8 MG tablet Take 1 tablet (8 mg total) by mouth every 8 (eight) hours as needed for nausea or vomiting. 40 tablet 1  . OZEMPIC, 0.25 OR 0.5 MG/DOSE, 2 MG/1.5ML SOPN Inject 0.5 mg into the skin every Sunday.    . prochlorperazine (COMPAZINE) 10 MG tablet Take 1 tablet (10 mg total) by mouth every 6 (six) hours as needed for nausea or vomiting. 40 tablet 1  . rizatriptan (MAXALT-MLT) 10 MG disintegrating tablet Take 5-10 mg by mouth as needed for migraine. May repeat in 2 hours if needed    . rosuvastatin (CRESTOR) 10 MG tablet Take 1 tablet (10 mg total) by mouth every morning. 90 tablet 1  . spironolactone (ALDACTONE) 50 MG tablet Take 1 tablet (50 mg total) by mouth daily. 90 tablet 0   No current facility-administered medications for this visit.   Facility-Administered Medications Ordered in Other Visits  Medication Dose Route Frequency Provider Last Rate Last Admin  . cyclophosphamide (CYTOXAN) 1,220 mg in sodium chloride 0.9 % 250 mL chemo infusion  600 mg/m2 (Treatment Plan Recorded) Intravenous Once Charlaine Dalton R, MD      . DOXOrubicin (ADRIAMYCIN) chemo injection 122 mg  60 mg/m2 (Treatment Plan Recorded) Intravenous Once Charlaine Dalton R, MD      . heparin lock flush 100  unit/mL  500 Units Intracatheter Once PRN Cammie Sickle, MD      . pembrolizumab [facility]) 200  mg in sodium chloride 0.9 % 50 mL chemo infusion  200 mg Intravenous Once Cammie Sickle, MD 116 mL/hr at 08/31/22 1232 200 mg at 08/31/22 1232      .  PHYSICAL EXAMINATION: ECOG PERFORMANCE STATUS: 0 - Asymptomatic  Vitals:   08/31/22 1107  BP: 137/65  Pulse: 79  Temp: (!) 97.3 F (36.3 C)  SpO2: 100%   Filed Weights   08/31/22 1107  Weight: 195 lb 6.4 oz (88.6 kg)    Physical Exam Vitals and nursing note reviewed.  HENT:     Head: Normocephalic and atraumatic.     Mouth/Throat:     Pharynx: Oropharynx is clear.  Eyes:     Extraocular Movements: Extraocular movements intact.     Pupils: Pupils are equal, round, and reactive to light.  Cardiovascular:     Rate and Rhythm: Normal rate and regular rhythm.  Pulmonary:     Comments: Decreased breath sounds bilaterally.  Abdominal:     Palpations: Abdomen is soft.  Musculoskeletal:        General: Normal range of motion.     Cervical back: Normal range of motion.  Skin:    General: Skin is warm.  Neurological:     General: No focal deficit present.     Mental Status: She is alert and oriented to person, place, and time.  Psychiatric:        Behavior: Behavior normal.        Judgment: Judgment normal.     LABORATORY DATA:  I have reviewed the data as listed Lab Results  Component Value Date   WBC 5.1 08/31/2022   HGB 9.8 (L) 08/31/2022   HCT 31.4 (L) 08/31/2022   MCV 94.9 08/31/2022   PLT 277 08/31/2022   Recent Labs    08/10/22 0929 08/16/22 0930 08/31/22 1051  NA 143 138 137  K 3.1* 3.7 3.9  CL 110 108 106  CO2 _0 GLUCOSE 156* 185* 170*  BUN _1 CREATININE 0.69 0.65 0.87  CALCIUM 8.3* 8.2* 9.1  GFRNONAA >60 >60 >60  PROT 6.2* 6.2* 6.8  ALBUMIN 3.2* 3.3* 3.5  AST _2 ALT _3 ALKPHOS 71 80 88  BILITOT 0.3 0.4 0.5    RADIOGRAPHIC STUDIES: I have personally  reviewed the radiological images as listed and agreed with the findings in the report. No results found.  ASSESSMENT & PLAN:   Carcinoma of upper-outer quadrant of right breast in female, estrogen receptor negative ([place]) # Stage II triple negative breast cancer [mT1cN1; Grade 3 ]- s/p mastectomy.  Patient currently on adjuvant chemoimmunotherapy [WUXLKGM 010 study]; patient s/p carboplatin weekly Taxol.  Currently on Adriamycin-Cytoxan Keytruda.   # Proceed with cycle #1 Adriamcycin- Cytoxan- Keytruda- moderate anemia/fatigue- June 6th- ECHO- 05/25/2022- 65-70%. TSH AUG 2023-WNL.   # Electrolyte abonormalities- hypocalcemia- 8.2-.  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  # DM-we will cut down the steroids to 6 mg.  Blood sugars today 170 [Orange juice]- on ozempic.  Monitor blood sugars closely.  # DISPOSITION: # Adriamycin-cytoxan-Keytruda today  # Follow up MD- in 3 weeks-;labs- cbc/cmp;Adriamycin-Cytoxan--keytruda; next day- udenyca- Dr.B      All questions were answered. The patient/family knows to call the clinic with any problems, questions or concerns.       Cammie Sickle, MD 08/31/2022 12:43 PM

## 2022-08-31 NOTE — Progress Notes (Signed)
Pt states she started iron with vit c for energy.  C/o fatigue.

## 2022-08-31 NOTE — Patient Instructions (Signed)
[facility]  Discharge Instructions: Thank you for choosing [facility] to provide your oncology and hematology care.  If you have a lab appointment with the Cancer Center, please go directly to the Cancer Center and check in at the registration area.  Wear comfortable clothing and clothing appropriate for easy access to any Portacath or PICC line.   We strive to give you quality time with your provider. You may need to reschedule your appointment if you arrive late (15 or more minutes).  Arriving late affects you and other patients whose appointments are after yours.  Also, if you miss three or more appointments without notifying the office, you may be dismissed from the clinic at the provider's discretion.      For prescription refill requests, have your pharmacy contact our office and allow 72 hours for refills to be completed.       To help prevent nausea and vomiting after your treatment, we encourage you to take your nausea medication as directed.  BELOW ARE SYMPTOMS THAT SHOULD BE REPORTED IMMEDIATELY: *FEVER GREATER THAN 100.4 F (38 C) OR HIGHER *CHILLS OR SWEATING *NAUSEA AND VOMITING THAT IS NOT CONTROLLED WITH YOUR NAUSEA MEDICATION *UNUSUAL SHORTNESS OF BREATH *UNUSUAL BRUISING OR BLEEDING *URINARY PROBLEMS (pain or burning when urinating, or frequent urination) *BOWEL PROBLEMS (unusual diarrhea, constipation, pain near the anus) TENDERNESS IN MOUTH AND THROAT WITH OR WITHOUT PRESENCE OF ULCERS (sore throat, sores in mouth, or a toothache) UNUSUAL RASH, SWELLING OR PAIN  UNUSUAL VAGINAL DISCHARGE OR ITCHING   Items with * indicate a potential emergency and should be followed up as soon as possible or go to the Emergency Department if any problems should occur.  Please show the CHEMOTHERAPY ALERT CARD or IMMUNOTHERAPY ALERT CARD at check-in to the Emergency Department and triage nurse.  Should you have questions after your  visit or need to cancel or reschedule your appointment, please contact [facility]  [phone] and follow the prompts.  Office hours are 8:00 a.m. to 4:30 p.m. Monday - Friday. Please note that voicemails left after 4:00 p.m. may not be returned until the following business day.  We are closed weekends and major holidays. You have access to a nurse at all times for urgent questions. Please call the main number to the clinic [phone] and follow the prompts.  For any non-urgent questions, you may also contact your provider using MyChart. We now offer e-Visits for anyone 18 and older to request care online for non-urgent symptoms. For details visit [email or web address].   Also download the MyChart app! Go to the app store, search "MyChart", open the app, select [facility], and log in with your MyChart username and password.  Masks are optional in the cancer centers. If you would like for your care team to wear a mask while they are taking care of you, please let them know. For doctor visits, patients may have with them one support person who is at least 71 years old. At this time, visitors are not allowed in the infusion area.   

## 2022-09-01 ENCOUNTER — Inpatient Hospital Stay: Payer: Medicare Other

## 2022-09-01 ENCOUNTER — Telehealth: Payer: Self-pay | Admitting: *Deleted

## 2022-09-01 DIAGNOSIS — C50811 Malignant neoplasm of overlapping sites of right female breast: Secondary | ICD-10-CM

## 2022-09-01 DIAGNOSIS — Z171 Estrogen receptor negative status [ER-]: Secondary | ICD-10-CM

## 2022-09-01 DIAGNOSIS — Z5112 Encounter for antineoplastic immunotherapy: Secondary | ICD-10-CM | POA: Diagnosis not present

## 2022-09-01 MED ORDER — PROMETHAZINE HCL 25 MG PO TABS: 25.0000 mg | ORAL_TABLET | Freq: Four times a day (QID) | ORAL | 0 refills | Status: DC | PRN

## 2022-09-01 MED ORDER — PEGFILGRASTIM-CBQV 6 MG/0.6ML ~~LOC~~ SOSY
6.0000 mg | PREFILLED_SYRINGE | Freq: Once | SUBCUTANEOUS | Status: AC
  Administered 2022-09-01: 6 mg via SUBCUTANEOUS
  Filled 2022-09-01: qty 0.6

## 2022-09-01 NOTE — Telephone Encounter (Signed)
Patient informed and rx sent to pharmacy.

## 2022-09-01 NOTE — Telephone Encounter (Signed)
Patient called to report nausea not improved with zofran or compazine. She is alternating medications and taking medications for nausea at least every 6 hours with no relief. She is having difficulty eating related to nausea. Please advise.

## 2022-09-01 NOTE — Telephone Encounter (Signed)
Dr. B recommendation:  Send a prescription for Phenergan 25 mg,  a 6 hours. as needed number 40; no refills. If not improved recommend evaluation in [facility]  tomorrow.

## 2022-09-03 ENCOUNTER — Inpatient Hospital Stay (HOSPITAL_BASED_OUTPATIENT_CLINIC_OR_DEPARTMENT_OTHER): Payer: Medicare Other | Admitting: Hospice and Palliative Medicine

## 2022-09-03 ENCOUNTER — Other Ambulatory Visit: Payer: Medicare Other

## 2022-09-03 ENCOUNTER — Encounter: Payer: Self-pay | Admitting: Hospice and Palliative Medicine

## 2022-09-03 ENCOUNTER — Other Ambulatory Visit: Payer: Self-pay | Admitting: *Deleted

## 2022-09-03 ENCOUNTER — Other Ambulatory Visit: Payer: Self-pay

## 2022-09-03 ENCOUNTER — Inpatient Hospital Stay: Payer: Medicare Other

## 2022-09-03 ENCOUNTER — Telehealth: Payer: Self-pay

## 2022-09-03 VITALS — BP 135/68 | HR 74 | Temp 99.4°F | Resp 18 | Ht 66.0 in | Wt 195.0 lb

## 2022-09-03 DIAGNOSIS — Z5112 Encounter for antineoplastic immunotherapy: Secondary | ICD-10-CM | POA: Diagnosis not present

## 2022-09-03 DIAGNOSIS — T451X5A Adverse effect of antineoplastic and immunosuppressive drugs, initial encounter: Secondary | ICD-10-CM

## 2022-09-03 DIAGNOSIS — Z95828 Presence of other vascular implants and grafts: Secondary | ICD-10-CM

## 2022-09-03 DIAGNOSIS — E860 Dehydration: Secondary | ICD-10-CM

## 2022-09-03 DIAGNOSIS — I10 Essential (primary) hypertension: Secondary | ICD-10-CM

## 2022-09-03 DIAGNOSIS — C50811 Malignant neoplasm of overlapping sites of right female breast: Secondary | ICD-10-CM

## 2022-09-03 DIAGNOSIS — R112 Nausea with vomiting, unspecified: Secondary | ICD-10-CM | POA: Diagnosis not present

## 2022-09-03 LAB — CBC WITH DIFFERENTIAL/PLATELET
Abs Immature Granulocytes: 0.2 10*3/uL — ABNORMAL HIGH (ref 0.00–0.07)
Band Neutrophils: 2 %
Basophils Absolute: 0 10*3/uL (ref 0.0–0.1)
Basophils Relative: 0 %
Eosinophils Absolute: 0.2 10*3/uL (ref 0.0–0.5)
Eosinophils Relative: 1 %
HCT: 31.7 % — ABNORMAL LOW (ref 36.0–46.0)
Hemoglobin: 9.9 g/dL — ABNORMAL LOW (ref 12.0–15.0)
Lymphocytes Relative: 11 %
Lymphs Abs: 1.8 10*3/uL (ref 0.7–4.0)
MCH: 29.7 pg (ref 26.0–34.0)
MCHC: 31.2 g/dL (ref 30.0–36.0)
MCV: 95.2 fL (ref 80.0–100.0)
Metamyelocytes Relative: 1 %
Monocytes Absolute: 0.2 10*3/uL (ref 0.1–1.0)
Monocytes Relative: 1 %
Neutro Abs: 13.8 10*3/uL — ABNORMAL HIGH (ref 1.7–7.7)
Neutrophils Relative %: 84 %
Platelets: 226 10*3/uL (ref 150–400)
RBC: 3.33 MIL/uL — ABNORMAL LOW (ref 3.87–5.11)
RDW: 17.7 % — ABNORMAL HIGH (ref 11.5–15.5)
Smear Review: NORMAL
WBC: 16.1 10*3/uL — ABNORMAL HIGH (ref 4.0–10.5)
nRBC: 0 % (ref 0.0–0.2)

## 2022-09-03 LAB — COMPREHENSIVE METABOLIC PANEL
ALT: 12 U/L (ref 0–44)
AST: 16 U/L (ref 15–41)
Albumin: 3.4 g/dL — ABNORMAL LOW (ref 3.5–5.0)
Alkaline Phosphatase: 86 U/L (ref 38–126)
Anion gap: 7 (ref 5–15)
BUN: 16 mg/dL (ref 8–23)
CO2: 24 mmol/L (ref 22–32)
Calcium: 8.9 mg/dL (ref 8.9–10.3)
Chloride: 108 mmol/L (ref 98–111)
Creatinine, Ser: 0.94 mg/dL (ref 0.44–1.00)
GFR, Estimated: >60 mL/min (ref >60)
Glucose, Bld: 131 mg/dL — ABNORMAL HIGH (ref 70–99)
Potassium: 3.7 mmol/L (ref 3.5–5.1)
Sodium: 139 mmol/L (ref 135–145)
Total Bilirubin: 0.6 mg/dL (ref 0.3–1.2)
Total Protein: 7 g/dL (ref 6.5–8.1)

## 2022-09-03 LAB — MAGNESIUM: Magnesium: 1.6 mg/dL — ABNORMAL LOW (ref 1.7–2.4)

## 2022-09-03 MED ORDER — HEPARIN SOD (PORK) LOCK FLUSH 100 UNIT/ML IV SOLN
500.0000 [IU] | Freq: Once | INTRAVENOUS | Status: AC
  Administered 2022-09-03: 500 [IU]
  Filled 2022-09-03: qty 5

## 2022-09-03 MED ORDER — PANTOPRAZOLE SODIUM 20 MG PO TBEC: 20.0000 mg | DELAYED_RELEASE_TABLET | Freq: Every day | ORAL | 1 refills | Status: DC

## 2022-09-03 MED ORDER — FAMOTIDINE IN NACL 20-0.9 MG/50ML-% IV SOLN
20.0000 mg | Freq: Once | INTRAVENOUS | Status: AC
  Administered 2022-09-03: 20 mg via INTRAVENOUS
  Filled 2022-09-03: qty 50

## 2022-09-03 MED ORDER — SODIUM CHLORIDE 0.9 % IV SOLN
INTRAVENOUS | Status: DC
  Filled 2022-09-03 (×3): qty 250

## 2022-09-03 MED ORDER — PROCHLORPERAZINE EDISYLATE 10 MG/2ML IJ SOLN
10.0000 mg | Freq: Once | INTRAMUSCULAR | Status: AC
  Administered 2022-09-03: 10 mg via INTRAVENOUS
  Filled 2022-09-03: qty 2

## 2022-09-03 MED ORDER — SODIUM CHLORIDE 0.9% FLUSH
10.0000 mL | Freq: Once | INTRAVENOUS | Status: AC
  Administered 2022-09-03: 10 mL via INTRAVENOUS
  Filled 2022-09-03: qty 10

## 2022-09-03 MED ORDER — MAGNESIUM SULFATE 2 GM/50ML IV SOLN
2.0000 g | Freq: Once | INTRAVENOUS | Status: AC
  Administered 2022-09-03: 2 g via INTRAVENOUS
  Filled 2022-09-03: qty 50

## 2022-09-03 MED ORDER — OLANZAPINE 10 MG PO TABS: 5.0000 mg | ORAL_TABLET | Freq: Every evening | ORAL | 1 refills | Status: DC | PRN

## 2022-09-03 MED ORDER — ONDANSETRON HCL 4 MG/2ML IJ SOLN
8.0000 mg | Freq: Once | INTRAMUSCULAR | Status: AC
  Administered 2022-09-03: 8 mg via INTRAVENOUS
  Filled 2022-09-03: qty 4

## 2022-09-03 NOTE — Telephone Encounter (Signed)
Patient is having nausea with a burning sensation (could not explain location of burning sensation).  The prescribed medication is not relieving her symptoms and is agreeable to [facility] eval.  On 09/01/22 message Dr. Jacinto Reap recommended she see [facility] if nausea sx did not improve.

## 2022-09-03 NOTE — Progress Notes (Signed)
Symptom Management [place] at [facility] [phone] [phone]  Patient Care Team: Steele Sizer, MD as PCP - General (Family Medicine) Bary Castilla, Forest Gleason, MD (General Surgery) Carloyn Manner, MD as Referring Physician (Otolaryngology) Yolonda Kida, MD as Consulting Physician (Cardiology) Ree Edman, MD as Referring Physician (Dermatology) Lonia Farber, MD as Consulting Physician (Internal Medicine) Cammie Sickle, MD as Consulting Physician (Internal Medicine)   NAME OF PATIENT: Morgan Peterson  [id number]  Mar 22, 1951   DATE OF VISIT: 09/03/22  REASON FOR CONSULT: Morgan Peterson is a 71 y.o. 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    INTERVAL HISTORY: Chemotherapy had been on hold due to anemia and fatigue but this was resumed and patient received cycle 5 on 08/31/22.  Patient presents to [facility] today for evaluation of nausea unrelieved with prochlorperazine and ondansetron.  Patient states that she started having nausea on 9/12 following her chemotherapy.  She denies fever or chills.  No diarrhea or constipation or abdominal pain.  No vomiting but she has had a burning epigastric sensation similar to her chronic GERD.  Oral intake has been poor.  Patient has been taking scheduled ondansetron and intermittently using prochlorperazine or promethazine but not regularly.  Denies any neurologic complaints. Denies urinary complaints. Patient offers no further specific complaints today.   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 Arimidex therapy completed September 2015   Mild mitral valve prolapse    Personal history of malignant neoplasm of breast 2010   Personal history of radiation therapy 2010   mammosite   T2DM (type 2 diabetes mellitus) ([place]) 2011    PAST SURGICAL HISTORY:  Past Surgical History:  Procedure Laterality Date   ABDOMINAL EXPLORATION SURGERY  2000   ovarian cyst   BREAST BIOPSY Right 2010   +    BREAST EXCISIONAL BIOPSY Right 2010   + mammo site inasive mammo ca   BREAST LUMPECTOMY Right 2010   [facility]   COLONOSCOPY  07/19/2012   Normal exam, Dr. Bary Castilla   COLONOSCOPY WITH PROPOFOL N/A 04/21/2022   Procedure: COLONOSCOPY WITH PROPOFOL;  Surgeon: Robert Bellow, MD;  Location: [facility] ENDOSCOPY;  Service: Endoscopy;  Laterality: N/A;   MASTECTOMY Right 2010   partial/ lumpectomy   PORTACATH PLACEMENT Left 05/19/2022   Procedure: INSERTION PORT-A-CATH;  Surgeon: Robert Bellow, MD;  Location: [facility] ORS;  Service: General;  Laterality: Left;   SIMPLE MASTECTOMY WITH AXILLARY SENTINEL NODE BIOPSY Right 04/26/2022   Procedure: SIMPLE MASTECTOMY WITH AXILLARY SENTINEL NODE BIOPSY;  Surgeon: Robert Bellow, MD;  Location: [facility] ORS;  Service: General;  Laterality: Right;  RNFA to assist   TUBAL LIGATION  20 years ago    HEMATOLOGY/ONCOLOGY HISTORY:  Oncology History Overview Note  She has a remote history of right breast cancer, T1bN0, ER positive,s/p lumpectomy and MammoSite radiation and 5 years of Arimidex.    02/19/22 screening mammogram bilaterally showed  1. Suspicious right breast mass  at the 10 o'clock position 8 cm from the nipple. Recommend ultrasound-guided biopsy. 2. Indeterminate right breast mass at the 6 o'clock position 4 cm from the nipple. Recommend ultrasound-guided biopsy. 3. No suspicious right axillary  lymphadenopathy.   04/12/2022 Unilateral right breast diagnostic mammogram showed 1. Suspicious right breast mass at the 10 o'clock position 8 cm from the nipple. Recommend ultrasound-guided biopsy. 2. Indeterminate right breast mass at the 6 o'clock position 4 cm from the nipple. Recommend ultrasound-guided biopsy. 3. No suspicious right axillary lymphadenopathy.   04/13/2022 right breast mass 6 o'clock position 1 cm from the nipple showed invasive mammary carcinoma, no special type. Grade 3, DCIS present high grade, LVI not identified. ER-, PR 1-10%, HER 2 -   04/13/2022, right breast mass 10:00 7 cm from nipple biopsy showed invasive mammary carcinoma, grade 3, high-grade DCIS with focal comedonecrosis, lymphovascular invasion not identified.  ER -, PR 1 to 10%, HER2-    04/26/2022, right breast mastectomy with axillary dissection Invasive mammary carcinoma, no special type, multifocal, DCIS high-grade, benign nipple/areola.  2 deposits of invasive mammary carcinoma, no definite residual lymph node identified.   Breast cancer ([place])  05/10/2022 Initial Diagnosis   Breast cancer ([place])   05/10/2022 Cancer Staging   Staging form: Breast, AJCC 8th Edition - Pathologic stage from 05/10/2022: Stage IIA (pT1c, pN1a, cM0, G3, ER-, PR+, HER2-) - Signed by Earlie Server, MD on 05/10/2022 Stage prefix: Initial diagnosis Histologic grading system: 3 grade system   05/24/2022 - 05/24/2022 Chemotherapy   Patient is on Treatment Plan : BREAST Pembrolizumab (200) D1 + Carboplatin (5) D1 + Paclitaxel (80) D1,8,15 q21d X 4 cycles / Pembrolizumab (200) D1 + AC D1 q21d x 4 cycles     05/27/2022 - 08/13/2022 Chemotherapy   Patient is on Treatment Plan : BREAST Pembrolizumab (200) D1 + Carboplatin (5) D1 + Paclitaxel (80) D1,8,15 q21d X 4 cycles / Pembrolizumab (200) D1 + AC D1 q21d x 4 cycles     05/27/2022 -  Chemotherapy   Patient is on Treatment Plan : BREAST Pembrolizumab (200) D1 + Carboplatin (5) D1 + Paclitaxel (80)  D1,8,15 q21d X 4 cycles / Pembrolizumab (200) D1 + AC D1 q21d x 4 cycles     Carcinoma of upper-outer quadrant of right breast in female, estrogen receptor negative ([place])  05/26/2022 Initial Diagnosis   Carcinoma of upper-outer quadrant of right breast in female, estrogen receptor negative ([place])   05/26/2022 Cancer Staging   Staging form: Breast, AJCC 8th Edition - Pathologic: Stage IIA (pT1c, pN1, cM0, G3, ER-, PR-, HER2-) - Signed by Cammie Sickle, MD on 05/26/2022 Multigene prognostic tests performed: None Histologic grading system: 3 grade system   05/27/2022 -  Chemotherapy   Patient is on Treatment Plan : BREAST Pembrolizumab (200) D1 + Carboplatin (5) D1 + Paclitaxel (80) D1,8,15 q21d X 4 cycles / Pembrolizumab (200) D1 + AC D1 q21d x 4 cycles       ALLERGIES:  is allergic to levaquin [levofloxacin in d5w], losartan, metoprolol, amlodipine-olmesartan, canagliflozin, egg white [egg white (egg protein)], gabapentin, glipizide, gluten meal, lisinopril, metformin and related, milk (cow), milk-related compounds, onglyza [saxagliptin], shrimp extract allergy skin test, tramadol, actos [pioglitazone], atorvastatin, carvedilol, and prednisone.  MEDICATIONS:  Current Outpatient Medications  Medication Sig Dispense Refill   aspirin EC 81 MG tablet Take 1 tablet (81 mg total) by mouth daily. 30 tablet 0   atenolol (TENORMIN) 25 MG tablet Take 1 tablet (25 mg total) by mouth daily. [place]  tablet 0   azelastine (ASTELIN) 0.1 % nasal spray Place 2 sprays into both nostrils daily as needed for allergies or rhinitis. Use in each nostril as directed     betamethasone, augmented, (DIPROLENE) 0.05 % lotion Apply 1 application topically 2 (two) times daily.     Blood Glucose Monitoring Suppl (ONETOUCH VERIO) w/Device KIT 1 Device by Does not apply route once. 1 kit 0   cholecalciferol (VITAMIN D) 1000 UNITS tablet Take 1,000 Units by mouth daily.     Cyanocobalamin (VITAMIN B-12 PO) Take 1 tablet by mouth  daily as needed (fatigue).     DULoxetine (CYMBALTA) 20 MG capsule Take 1 capsule (20 mg total) by mouth daily. 30 capsule 3   EPINEPHrine (EPIPEN 2-PAK) 0.3 mg/0.3 mL IJ SOAJ injection Inject 0.3 mg into the muscle as needed for anaphylaxis. 2 each 0   famotidine (PEPCID) 20 MG tablet Take 20 mg by mouth 2 (two) times daily.     glucose blood (ONETOUCH VERIO) test strip Use as instructed 100 each 12   Lancets (ONETOUCH ULTRASOFT) lancets Use as instructed 100 each 3   levocetirizine (XYZAL) 5 MG tablet Take 1 tablet (5 mg total) by mouth every evening. (Patient taking differently: Take 5 mg by mouth daily as needed for allergies.) 90 tablet 1   lidocaine-prilocaine (EMLA) cream Apply 1 application. topically as needed. Apply to port and cover with saran wrap 1-2 hours prior to port access 30 g 1   montelukast (SINGULAIR) 10 MG tablet Take 1 tablet (10 mg total) by mouth at bedtime. (Patient taking differently: Take 10 mg by mouth at bedtime as needed (allergies).) 90 tablet 1   Multiple Vitamin (MULTIVITAMIN WITH MINERALS) TABS tablet Take 1 tablet by mouth 2 (two) times a week.     ondansetron (ZOFRAN) 8 MG tablet Take 1 tablet (8 mg total) by mouth every 8 (eight) hours as needed for nausea or vomiting. 40 tablet 1   OZEMPIC, 0.25 OR 0.5 MG/DOSE, 2 MG/1.5ML SOPN Inject 0.5 mg into the skin every Sunday.     prochlorperazine (COMPAZINE) 10 MG tablet Take 1 tablet (10 mg total) by mouth every 6 (six) hours as needed for nausea or vomiting. 40 tablet 1   promethazine (PHENERGAN) 25 MG tablet Take 1 tablet (25 mg total) by mouth every 6 (six) hours as needed for nausea or vomiting. 40 tablet 0   rizatriptan (MAXALT-MLT) 10 MG disintegrating tablet Take 5-10 mg by mouth as needed for migraine. May repeat in 2 hours if needed     rosuvastatin (CRESTOR) 10 MG tablet Take 1 tablet (10 mg total) by mouth every morning. 90 tablet 1   spironolactone (ALDACTONE) 50 MG tablet Take 1 tablet (50 mg total) by  mouth daily. 90 tablet 0   No current facility-administered medications for this visit.    VITAL SIGNS: There were no vitals taken for this visit. There were no vitals filed for this visit.  Estimated body mass index is 31.54 kg/m as calculated from the following:   Height as of 08/31/22: '5\' 6"'  (1.676 m).   Weight as of 08/31/22: 195 lb 6.4 oz (88.6 kg).  LABS: CBC:    Component Value Date/Time   WBC 5.1 08/31/2022 1051   HGB 9.8 (L) 08/31/2022 1051   HGB 12.4 03/24/2015 1348   HCT 31.4 (L) 08/31/2022 1051   HCT 38.6 03/24/2015 1348   PLT 277 08/31/2022 1051   PLT 250 03/24/2015 1348   MCV 94.9 08/31/2022 1051  MCV 86 03/24/2015 1348   NEUTROABS 3.1 08/31/2022 1051   NEUTROABS 3.8 03/24/2015 1348   LYMPHSABS 1.6 08/31/2022 1051   LYMPHSABS 2.0 03/24/2015 1348   MONOABS 0.4 08/31/2022 1051   MONOABS 0.3 03/24/2015 1348   EOSABS 0.0 08/31/2022 1051   EOSABS 0.2 03/24/2015 1348   BASOSABS 0.0 08/31/2022 1051   BASOSABS 0.0 03/24/2015 1348   Comprehensive Metabolic Panel:    Component Value Date/Time   NA 137 08/31/2022 1051   NA 141 06/26/2019 0000   NA 137 03/24/2015 1348   K 3.9 08/31/2022 1051   K 3.5 03/24/2015 1348   CL 106 08/31/2022 1051   CL 101 03/24/2015 1348   CO2 25 08/31/2022 1051   CO2 31 03/24/2015 1348   BUN 11 08/31/2022 1051   BUN 9 06/26/2019 0000   BUN 11 03/24/2015 1348   CREATININE 0.87 08/31/2022 1051   CREATININE 0.81 12/31/2021 1005   GLUCOSE 170 (H) 08/31/2022 1051   GLUCOSE 162 (H) 03/24/2015 1348   CALCIUM 9.1 08/31/2022 1051   CALCIUM 8.7 (L) 03/24/2015 1348   AST 22 08/31/2022 1051   AST 19 03/24/2015 1348   ALT 16 08/31/2022 1051   ALT 16 03/24/2015 1348   ALKPHOS 88 08/31/2022 1051   ALKPHOS 112 03/24/2015 1348   BILITOT 0.5 08/31/2022 1051   BILITOT 0.4 09/13/2017 1025   BILITOT 0.3 03/24/2015 1348   PROT 6.8 08/31/2022 1051   PROT 7.0 09/13/2017 1025   PROT 7.2 03/24/2015 1348   ALBUMIN 3.5 08/31/2022 1051   ALBUMIN  3.9 09/13/2017 1025   ALBUMIN 3.6 03/24/2015 1348    RADIOGRAPHIC STUDIES: No results found.  PERFORMANCE STATUS (ECOG) : 1 - Symptomatic but completely ambulatory  Review of Systems Unless otherwise noted, a complete review of systems is negative.  Physical Exam General: NAD Cardiovascular: regular rate and rhythm Pulmonary: clear ant fields Abdomen: soft, nontender, + bowel sounds GU: no suprapubic tenderness Extremities: no edema, no joint deformities Skin: no rashes Neurological: Weakness but otherwise nonfocal  IMPRESSION/PLAN: Nausea -most likely secondary to chemotherapy.  Patient is nontoxic-appearing with benign exam.  Proceed with IV fluids, Pepcid, and Zofran.  Discussed adjustment of her antiemetic regimen.  Will discontinue prochlorperazine and start olanzapine as needed at bedtime.  Patient may continue scheduled ondansetron.  We will start PPI.  Recommend bland diet and pushing p.o. fluids as tolerated.  Will check EKG to assess QTc in light of scheduled antiemetics.  RTC next week for labs/possible fluids  Patient expressed understanding and was in agreement with this plan. She also understands that She can call clinic at any time with any questions, concerns, or complaints.   Thank you for allowing me to participate in the care of this very pleasant patient.   Time Total: 20 minutes  Visit consisted of counseling and education dealing with the complex and emotionally intense issues of symptom management in the setting of serious illness.Greater than 50%  of this time was spent counseling and coordinating care related to the above assessment and plan.  Signed by: Altha Harm, PhD, NP-C

## 2022-09-03 NOTE — Telephone Encounter (Signed)
Contacted patient. Apt scheduled for 1pm

## 2022-09-10 ENCOUNTER — Other Ambulatory Visit: Payer: Self-pay | Admitting: Medical Oncology

## 2022-09-10 ENCOUNTER — Inpatient Hospital Stay: Payer: Medicare Other

## 2022-09-10 ENCOUNTER — Other Ambulatory Visit: Payer: Medicare Other

## 2022-09-10 ENCOUNTER — Ambulatory Visit: Payer: Medicare Other

## 2022-09-10 VITALS — BP 112/75 | HR 91 | Temp 98.7°F | Resp 18

## 2022-09-10 DIAGNOSIS — Z5112 Encounter for antineoplastic immunotherapy: Secondary | ICD-10-CM | POA: Diagnosis not present

## 2022-09-10 DIAGNOSIS — T451X5A Adverse effect of antineoplastic and immunosuppressive drugs, initial encounter: Secondary | ICD-10-CM

## 2022-09-10 DIAGNOSIS — E876 Hypokalemia: Secondary | ICD-10-CM

## 2022-09-10 DIAGNOSIS — Z95828 Presence of other vascular implants and grafts: Secondary | ICD-10-CM

## 2022-09-10 DIAGNOSIS — E860 Dehydration: Secondary | ICD-10-CM

## 2022-09-10 LAB — BASIC METABOLIC PANEL
Anion gap: 7 (ref 5–15)
BUN: 13 mg/dL (ref 8–23)
CO2: 24 mmol/L (ref 22–32)
Calcium: 8.5 mg/dL — ABNORMAL LOW (ref 8.9–10.3)
Chloride: 106 mmol/L (ref 98–111)
Creatinine, Ser: 0.84 mg/dL (ref 0.44–1.00)
GFR, Estimated: >60 mL/min (ref >60)
Glucose, Bld: 172 mg/dL — ABNORMAL HIGH (ref 70–99)
Potassium: 3.3 mmol/L — ABNORMAL LOW (ref 3.5–5.1)
Sodium: 137 mmol/L (ref 135–145)

## 2022-09-10 LAB — MAGNESIUM: Magnesium: 1.5 mg/dL — ABNORMAL LOW (ref 1.7–2.4)

## 2022-09-10 MED ORDER — SODIUM CHLORIDE 0.9 % IV SOLN
INTRAVENOUS | Status: DC
  Filled 2022-09-10 (×2): qty 250

## 2022-09-10 MED ORDER — MAGNESIUM SULFATE 4 GM/100ML IV SOLN
4.0000 g | Freq: Once | INTRAVENOUS | Status: AC
  Administered 2022-09-10: 4 g via INTRAVENOUS

## 2022-09-10 MED ORDER — SODIUM CHLORIDE 0.9% FLUSH
10.0000 mL | Freq: Once | INTRAVENOUS | Status: AC
  Administered 2022-09-10: 10 mL via INTRAVENOUS
  Filled 2022-09-10: qty 10

## 2022-09-10 MED ORDER — DRONABINOL 2.5 MG PO CAPS: 2.5000 mg | ORAL_CAPSULE | Freq: Every day | ORAL | 0 refills | Status: DC

## 2022-09-10 MED ORDER — HEPARIN SOD (PORK) LOCK FLUSH 100 UNIT/ML IV SOLN
500.0000 [IU] | Freq: Once | INTRAVENOUS | Status: AC
  Administered 2022-09-10: 500 [IU]
  Filled 2022-09-10: qty 5

## 2022-09-10 MED ORDER — POTASSIUM CHLORIDE 20 MEQ/100ML IV SOLN
20.0000 meq | Freq: Once | INTRAVENOUS | Status: AC
  Administered 2022-09-10: 20 meq via INTRAVENOUS

## 2022-09-10 NOTE — Progress Notes (Signed)
Pt requested script for Marinol. 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 She has not tried it previously but asks if this is an option. We discussed how to take along with common potential side effects and precautions. PMPD reviewed. She will need to follow up with palliative care in about 1 month for further refills. Pt aware.

## 2022-09-10 NOTE — Progress Notes (Signed)
Patient requested a script for marinol due to decrease appetite changes and nausea.

## 2022-09-14 ENCOUNTER — Telehealth: Payer: Self-pay | Admitting: *Deleted

## 2022-09-14 NOTE — Telephone Encounter (Signed)
Patient called asking if there is any reason she could not be around a newborn, She has a new great grandchild she wants to see. Please advise

## 2022-09-14 NOTE — Telephone Encounter (Signed)
Call returned to patient and informed that per Dr B that she can be around the newborn

## 2022-09-17 ENCOUNTER — Encounter: Payer: Self-pay | Admitting: Hospice and Palliative Medicine

## 2022-09-17 ENCOUNTER — Other Ambulatory Visit: Payer: Self-pay

## 2022-09-17 ENCOUNTER — Inpatient Hospital Stay (HOSPITAL_BASED_OUTPATIENT_CLINIC_OR_DEPARTMENT_OTHER): Payer: Medicare Other | Admitting: Hospice and Palliative Medicine

## 2022-09-17 ENCOUNTER — Telehealth: Payer: Self-pay | Admitting: *Deleted

## 2022-09-17 ENCOUNTER — Inpatient Hospital Stay: Payer: Medicare Other

## 2022-09-17 DIAGNOSIS — Z95828 Presence of other vascular implants and grafts: Secondary | ICD-10-CM

## 2022-09-17 DIAGNOSIS — E860 Dehydration: Secondary | ICD-10-CM

## 2022-09-17 DIAGNOSIS — Z5112 Encounter for antineoplastic immunotherapy: Secondary | ICD-10-CM | POA: Diagnosis not present

## 2022-09-17 DIAGNOSIS — E876 Hypokalemia: Secondary | ICD-10-CM

## 2022-09-17 LAB — MAGNESIUM: Magnesium: 1.5 mg/dL — ABNORMAL LOW (ref 1.7–2.4)

## 2022-09-17 LAB — BASIC METABOLIC PANEL
Anion gap: 4 — ABNORMAL LOW (ref 5–15)
BUN: 7 mg/dL — ABNORMAL LOW (ref 8–23)
CO2: 26 mmol/L (ref 22–32)
Calcium: 9 mg/dL (ref 8.9–10.3)
Chloride: 109 mmol/L (ref 98–111)
Creatinine, Ser: 0.79 mg/dL (ref 0.44–1.00)
GFR, Estimated: >60 mL/min (ref >60)
Glucose, Bld: 123 mg/dL — ABNORMAL HIGH (ref 70–99)
Potassium: 3.8 mmol/L (ref 3.5–5.1)
Sodium: 139 mmol/L (ref 135–145)

## 2022-09-17 MED ORDER — HEPARIN SOD (PORK) LOCK FLUSH 100 UNIT/ML IV SOLN
500.0000 [IU] | Freq: Once | INTRAVENOUS | Status: AC
  Administered 2022-09-17: 500 [IU]
  Filled 2022-09-17: qty 5

## 2022-09-17 MED ORDER — MAGNESIUM SULFATE 2 GM/50ML IV SOLN
2.0000 g | Freq: Once | INTRAVENOUS | Status: AC
  Administered 2022-09-17: 2 g via INTRAVENOUS
  Filled 2022-09-17: qty 50

## 2022-09-17 MED ORDER — SODIUM CHLORIDE 0.9 % IV SOLN
INTRAVENOUS | Status: DC
  Filled 2022-09-17 (×2): qty 250

## 2022-09-17 MED ORDER — MAGNESIUM OXIDE -MG SUPPLEMENT 400 (240 MG) MG PO TABS: 400.0000 mg | ORAL_TABLET | Freq: Every day | ORAL | 0 refills | Status: DC

## 2022-09-17 MED ORDER — SODIUM CHLORIDE 0.9% FLUSH
10.0000 mL | Freq: Once | INTRAVENOUS | Status: AC
  Administered 2022-09-17: 10 mL via INTRAVENOUS
  Filled 2022-09-17: qty 10

## 2022-09-17 NOTE — Telephone Encounter (Signed)
Patient unable to get her marinol from pharmacy as prescribed by Judson Roch, [place]. Script needed PA. I attempted a PA  multiple times with covermymeds, which would not allow RN to proceed with PA. "Patient not found with name/dob" with current insurance id/and demographics. I reached out to patient's insurance w/ [employer] at [id number]. Per insurance, patient is under the [employer] RX as "Morgan Peterson."  Reattempted rx PA w/ this name with representative on phone, but the covermymeds would still not proceed with the PA electronically.  Representative allowed RN to proceed with a verbal PA. PA obtained/approved by insurance. Auth-# [id number]; good through March 18, 2023.  I reached out to patient and left a vm that script was approved.

## 2022-09-17 NOTE — Progress Notes (Signed)
Symptom Management [place] at [facility] Telephone:([phone]) [phone] [phone]) [phone]  Patient Care Team: Steele Sizer, MD as PCP - General (Family Medicine) Bary Castilla, Forest Gleason, MD (General Surgery) Carloyn Manner, MD as Referring Physician (Otolaryngology) Yolonda Kida, MD as Consulting Physician (Cardiology) Ree Edman, MD as Referring Physician (Dermatology) Lonia Farber, MD as Consulting Physician (Internal Medicine) Cammie Sickle, MD as Consulting Physician (Internal Medicine)   NAME OF PATIENT: Morgan Peterson  [id number]  [birth year]   DATE OF VISIT: 09/17/22  REASON FOR CONSULT: Morgan Peterson is a 71 y.o. 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    INTERVAL HISTORY: Chemotherapy had been on hold due to anemia and fatigue but this was resumed and patient received cycle 5 on 08/31/22.  Patient was seen in [facility] on 09/03/2022 with intractable nausea and vomiting.  She was treated with supportive care with scheduled antiemetics.  Patient returns to [facility] today for routine follow-up/labs.  Today, patient is asymptomatic.  She reports feeling "great".    Denies any neurologic complaints. Denies urinary complaints. Patient offers no further specific complaints today.   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 Arimidex therapy completed September 2015   Mild mitral valve prolapse     Personal history of malignant neoplasm of breast 2010   Personal history of radiation therapy 2010   mammosite   T2DM (type 2 diabetes mellitus) ([place]) 2011    PAST SURGICAL HISTORY:  Past Surgical History:  Procedure Laterality Date   ABDOMINAL EXPLORATION SURGERY  2000   ovarian cyst   BREAST BIOPSY Right 2010   +    BREAST EXCISIONAL BIOPSY Right 2010   + mammo site inasive mammo ca   BREAST LUMPECTOMY Right 2010   [facility]   COLONOSCOPY  07/19/2012   Normal exam, Dr. Bary Castilla   COLONOSCOPY WITH PROPOFOL N/A 04/21/2022   Procedure: COLONOSCOPY WITH PROPOFOL;  Surgeon: Robert Bellow, MD;  Location: [facility] ENDOSCOPY;  Service: Endoscopy;  Laterality: N/A;   MASTECTOMY Right 2010   partial/ lumpectomy   PORTACATH PLACEMENT Left 05/19/2022   Procedure: INSERTION PORT-A-CATH;  Surgeon: Robert Bellow, MD;  Location: [facility] ORS;  Service: General;  Laterality: Left;   SIMPLE MASTECTOMY WITH AXILLARY SENTINEL NODE BIOPSY Right 04/26/2022   Procedure: SIMPLE MASTECTOMY WITH AXILLARY SENTINEL NODE BIOPSY;  Surgeon: Robert Bellow, MD;  Location: [facility] ORS;  Service: General;  Laterality: Right;  RNFA to assist   TUBAL LIGATION  20 years ago    HEMATOLOGY/ONCOLOGY HISTORY:  Oncology History Overview Note  She has a remote history of right breast cancer, T1bN0, ER positive,s/p lumpectomy and MammoSite radiation and 5 years of Arimidex.    02/19/22 screening mammogram bilaterally showed  1. Suspicious right breast mass at the 10 o'clock position 8 cm from the nipple. Recommend ultrasound-guided biopsy. 2. Indeterminate right breast mass at the 6 o'clock position 4 cm from the nipple. Recommend ultrasound-guided biopsy. 3. No suspicious right axillary  lymphadenopathy.   04/12/2022 Unilateral right breast diagnostic mammogram showed 1. Suspicious right breast mass at the 10 o'clock position 8 cm from the nipple. Recommend ultrasound-guided biopsy. 2. Indeterminate right breast mass at the 6 o'clock  position 4 cm from the nipple. Recommend ultrasound-guided biopsy. 3. No suspicious right axillary lymphadenopathy.   04/13/2022 right breast mass 6 o'clock position 1 cm from the nipple showed invasive mammary carcinoma, no special type. Grade 3, DCIS present high grade, LVI not identified. ER-, PR 1-10%, HER 2 -   04/13/2022, right breast mass 10:00 7 cm from nipple biopsy showed invasive mammary carcinoma, grade 3, high-grade DCIS with focal comedonecrosis, lymphovascular invasion not identified.  ER -, PR 1 to 10%, HER2-    04/26/2022, right breast mastectomy with axillary dissection Invasive mammary carcinoma, no special type, multifocal, DCIS high-grade, benign nipple/areola.  2 deposits of invasive mammary carcinoma, no definite residual lymph node identified.   Breast cancer ([place])  05/10/2022 Initial Diagnosis   Breast cancer ([place])   05/10/2022 Cancer Staging   Staging form: Breast, AJCC 8th Edition - Pathologic stage from 05/10/2022: Stage IIA (pT1c, pN1a, cM0, G3, ER-, PR+, HER2-) - Signed by Earlie Server, MD on 05/10/2022 Stage prefix: Initial diagnosis Histologic grading system: 3 grade system   05/24/2022 - 05/24/2022 Chemotherapy   Patient is on Treatment Plan : BREAST Pembrolizumab (200) D1 + Carboplatin (5) D1 + Paclitaxel (80) D1,8,15 q21d X 4 cycles / Pembrolizumab (200) D1 + AC D1 q21d x 4 cycles     05/27/2022 - 08/13/2022 Chemotherapy   Patient is on Treatment Plan : BREAST Pembrolizumab (200) D1 + Carboplatin (5) D1 + Paclitaxel (80) D1,8,15 q21d X 4 cycles / Pembrolizumab (200) D1 + AC D1 q21d x 4 cycles     05/27/2022 -  Chemotherapy   Patient is on Treatment Plan : BREAST Pembrolizumab (200) D1 + Carboplatin (5) D1 + Paclitaxel (80) D1,8,15 q21d X 4 cycles / Pembrolizumab (200) D1 + AC D1 q21d x 4 cycles     Carcinoma of upper-outer quadrant of right breast in female, estrogen receptor negative ([place])  05/26/2022 Initial Diagnosis   Carcinoma of upper-outer quadrant of right breast in  female, estrogen receptor negative ([place])   05/26/2022 Cancer Staging   Staging form: Breast, AJCC 8th Edition - Pathologic: Stage IIA (pT1c, pN1, cM0, G3, ER-, PR-, HER2-) - Signed by Cammie Sickle, MD on 05/26/2022 Multigene prognostic tests performed: None Histologic grading system: 3 grade system   05/27/2022 -  Chemotherapy   Patient is on Treatment Plan : BREAST Pembrolizumab (200) D1 + Carboplatin (5) D1 + Paclitaxel (80) D1,8,15 q21d X 4 cycles / Pembrolizumab (200) D1 + AC D1 q21d x 4 cycles       ALLERGIES:  is allergic to levaquin [levofloxacin in d5w], losartan, metoprolol, amlodipine-olmesartan, canagliflozin, egg white [egg white (egg protein)], gabapentin, glipizide, gluten meal, lisinopril, metformin and related, milk (cow), milk-related compounds, onglyza [saxagliptin], shrimp extract allergy skin test, tramadol, actos [pioglitazone], atorvastatin, carvedilol, and prednisone.  MEDICATIONS:  Current Outpatient Medications  Medication Sig Dispense Refill   aspirin EC 81 MG tablet Take 1 tablet (81 mg total) by mouth daily. 30 tablet 0   atenolol (TENORMIN) 25 MG tablet Take 1 tablet (25 mg total) by mouth daily. 90 tablet 0   azelastine (ASTELIN) 0.1 % nasal spray Place 2 sprays into both nostrils daily as needed for allergies or rhinitis. Use in each nostril as directed     betamethasone, augmented, (DIPROLENE)  0.05 % lotion Apply 1 application topically 2 (two) times daily.     Blood Glucose Monitoring Suppl (ONETOUCH VERIO) w/Device KIT 1 Device by Does not apply route once. 1 kit 0   cholecalciferol (VITAMIN D) 1000 UNITS tablet Take 1,000 Units by mouth daily.     Cyanocobalamin (VITAMIN B-12 PO) Take 1 tablet by mouth daily as needed (fatigue).     dronabinol (MARINOL) 2.5 MG capsule Take 1 capsule (2.5 mg total) by mouth daily before lunch. 60 capsule 0   EPINEPHrine (EPIPEN 2-PAK) 0.3 mg/0.3 mL IJ SOAJ injection Inject 0.3 mg into the muscle as needed for  anaphylaxis. (Patient not taking: Reported on 09/03/2022) 2 each 0   famotidine (PEPCID) 20 MG tablet Take 20 mg by mouth 2 (two) times daily.     glucose blood (ONETOUCH VERIO) test strip Use as instructed 100 each 12   Lancets (ONETOUCH ULTRASOFT) lancets Use as instructed 100 each 3   levocetirizine (XYZAL) 5 MG tablet Take 1 tablet (5 mg total) by mouth every evening. (Patient not taking: Reported on 09/03/2022) 90 tablet 1   lidocaine-prilocaine (EMLA) cream Apply 1 application. topically as needed. Apply to port and cover with saran wrap 1-2 hours prior to port access 30 g 1   montelukast (SINGULAIR) 10 MG tablet Take 1 tablet (10 mg total) by mouth at bedtime. (Patient not taking: Reported on 09/03/2022) 90 tablet 1   Multiple Vitamin (MULTIVITAMIN WITH MINERALS) TABS tablet Take 1 tablet by mouth 2 (two) times a week.     OLANZapine (ZYPREXA) 10 MG tablet Take 0.5-1 tablets (5-10 mg total) by mouth at bedtime as needed (nausea). 30 tablet 1   ondansetron (ZOFRAN) 8 MG tablet Take 1 tablet (8 mg total) by mouth every 8 (eight) hours as needed for nausea or vomiting. 40 tablet 1   OZEMPIC, 0.25 OR 0.5 MG/DOSE, 2 MG/1.5ML SOPN Inject 0.5 mg into the skin every Sunday.     pantoprazole (PROTONIX) 20 MG tablet Take 1 tablet (20 mg total) by mouth daily. 30 tablet 1   promethazine (PHENERGAN) 25 MG tablet Take 1 tablet (25 mg total) by mouth every 6 (six) hours as needed for nausea or vomiting. 40 tablet 0   rizatriptan (MAXALT-MLT) 10 MG disintegrating tablet Take 5-10 mg by mouth as needed for migraine. May repeat in 2 hours if needed     rosuvastatin (CRESTOR) 10 MG tablet Take 1 tablet (10 mg total) by mouth every morning. 90 tablet 1   spironolactone (ALDACTONE) 50 MG tablet Take 1 tablet (50 mg total) by mouth daily. 90 tablet 0   No current facility-administered medications for this visit.   Facility-Administered Medications Ordered in Other Visits  Medication Dose Route Frequency Provider  Last Rate Last Admin   0.9 %  sodium chloride infusion   Intravenous Continuous Sanae Willetts, Kirt Boys, NP 150 mL/hr at 09/17/22 0926 New Bag at 09/17/22 0926   heparin lock flush 100 unit/mL  500 Units Intracatheter Once Alexi Geibel, Kirt Boys, NP        VITAL SIGNS: BP 129/64   Pulse 85   Temp (!) 97.1 F (36.2 C) (Tympanic)   Resp 20   Ht _0  (1.676 m)   Wt 193 lb 14.4 oz (88 kg)   BMI 31.30 kg/m  Filed Weights   09/17/22 0928  Weight: 193 lb 14.4 oz (88 kg)    Estimated body mass index is 31.3 kg/m as calculated from the following:   Height as  of this encounter: _0  (1.676 m).   Weight as of this encounter: 193 lb 14.4 oz (88 kg).  LABS: CBC:    Component Value Date/Time   WBC 16.1 (H) 09/03/2022 1306   HGB 9.9 (L) 09/03/2022 1306   HGB 12.4 03/24/2015 1348   HCT 31.7 (L) 09/03/2022 1306   HCT 38.6 03/24/2015 1348   PLT 226 09/03/2022 1306   PLT 250 03/24/2015 1348   MCV 95.2 09/03/2022 1306   MCV 86 03/24/2015 1348   NEUTROABS 13.8 (H) 09/03/2022 1306   NEUTROABS 3.8 03/24/2015 1348   LYMPHSABS 1.8 09/03/2022 1306   LYMPHSABS 2.0 03/24/2015 1348   MONOABS 0.2 09/03/2022 1306   MONOABS 0.3 03/24/2015 1348   EOSABS 0.2 09/03/2022 1306   EOSABS 0.2 03/24/2015 1348   BASOSABS 0.0 09/03/2022 1306   BASOSABS 0.0 03/24/2015 1348   Comprehensive Metabolic Panel:    Component Value Date/Time   NA 137 09/10/2022 0935   NA 141 06/26/2019 0000   NA 137 03/24/2015 1348   K 3.3 (L) 09/10/2022 0935   K 3.5 03/24/2015 1348   CL 106 09/10/2022 0935   CL 101 03/24/2015 1348   CO2 24 09/10/2022 0935   CO2 31 03/24/2015 1348   BUN 13 09/10/2022 0935   BUN 9 06/26/2019 0000   BUN 11 03/24/2015 1348   CREATININE 0.84 09/10/2022 0935   CREATININE 0.81 12/31/2021 1005   GLUCOSE 172 (H) 09/10/2022 0935   GLUCOSE 162 (H) 03/24/2015 1348   CALCIUM 8.5 (L) 09/10/2022 0935   CALCIUM 8.7 (L) 03/24/2015 1348   AST 16 09/03/2022 1306   AST 19 03/24/2015 1348   ALT 12 09/03/2022  1306   ALT 16 03/24/2015 1348   ALKPHOS 86 09/03/2022 1306   ALKPHOS 112 03/24/2015 1348   BILITOT 0.6 09/03/2022 1306   BILITOT 0.4 09/13/2017 1025   BILITOT 0.3 03/24/2015 1348   PROT 7.0 09/03/2022 1306   PROT 7.0 09/13/2017 1025   PROT 7.2 03/24/2015 1348   ALBUMIN 3.4 (L) 09/03/2022 1306   ALBUMIN 3.9 09/13/2017 1025   ALBUMIN 3.6 03/24/2015 1348    RADIOGRAPHIC STUDIES: No results found.  PERFORMANCE STATUS (ECOG) : 1 - Symptomatic but completely ambulatory  Review of Systems Unless otherwise noted, a complete review of systems is negative.  Physical Exam General: NAD Cardiovascular: regular rate and rhythm Pulmonary: clear ant fields Abdomen: soft, nontender, + bowel sounds GU: no suprapubic tenderness Extremities: no edema, no joint deformities Skin: no rashes Neurological: Weakness but otherwise nonfocal  IMPRESSION/PLAN: Nausea -secondary to chemotherapy.  This appears to have resolved.  Would continue as needed antiemetics following chemotherapy if needed.  Hypomagnesemia - likely secondary to chemotherapy. Proceed with IV repletion today. Will start on mag oxide oral supplement. Recheck labs next week.   RTC next week as scheduled for cycle 6 chemotherapy.  Patient expressed understanding and was in agreement with this plan. She also understands that She can call clinic at any time with any questions, concerns, or complaints.   Thank you for allowing me to participate in the care of this very pleasant patient.   Time Total: 20 minutes  Visit consisted of counseling and education dealing with the complex and emotionally intense issues of symptom management in the setting of serious illness.Greater than 50%  of this time was spent counseling and coordinating care related to the above assessment and plan.  Signed by: Altha Harm, PhD, NP-C

## 2022-09-20 MED FILL — Fosaprepitant Dimeglumine For IV Infusion 150 MG (Base Eq): INTRAVENOUS | Qty: 5 | Status: AC

## 2022-09-21 ENCOUNTER — Inpatient Hospital Stay: Payer: Medicare Other

## 2022-09-21 ENCOUNTER — Inpatient Hospital Stay: Payer: Medicare Other | Attending: Oncology | Admitting: Internal Medicine

## 2022-09-21 VITALS — BP 112/68 | HR 75 | Temp 98.4°F | Resp 18 | Ht 66.0 in | Wt 193.6 lb

## 2022-09-21 DIAGNOSIS — R112 Nausea with vomiting, unspecified: Secondary | ICD-10-CM | POA: Insufficient documentation

## 2022-09-21 DIAGNOSIS — Z87891 Personal history of nicotine dependence: Secondary | ICD-10-CM | POA: Diagnosis not present

## 2022-09-21 DIAGNOSIS — K921 Melena: Secondary | ICD-10-CM | POA: Diagnosis not present

## 2022-09-21 DIAGNOSIS — C50811 Malignant neoplasm of overlapping sites of right female breast: Secondary | ICD-10-CM | POA: Diagnosis present

## 2022-09-21 DIAGNOSIS — Z923 Personal history of irradiation: Secondary | ICD-10-CM | POA: Diagnosis not present

## 2022-09-21 DIAGNOSIS — E1142 Type 2 diabetes mellitus with diabetic polyneuropathy: Secondary | ICD-10-CM | POA: Insufficient documentation

## 2022-09-21 DIAGNOSIS — E860 Dehydration: Secondary | ICD-10-CM | POA: Diagnosis not present

## 2022-09-21 DIAGNOSIS — Z5189 Encounter for other specified aftercare: Secondary | ICD-10-CM | POA: Diagnosis not present

## 2022-09-21 DIAGNOSIS — D649 Anemia, unspecified: Secondary | ICD-10-CM | POA: Insufficient documentation

## 2022-09-21 DIAGNOSIS — T451X5A Adverse effect of antineoplastic and immunosuppressive drugs, initial encounter: Secondary | ICD-10-CM | POA: Diagnosis not present

## 2022-09-21 DIAGNOSIS — Z171 Estrogen receptor negative status [ER-]: Secondary | ICD-10-CM

## 2022-09-21 DIAGNOSIS — D6959 Other secondary thrombocytopenia: Secondary | ICD-10-CM | POA: Insufficient documentation

## 2022-09-21 DIAGNOSIS — C50411 Malignant neoplasm of upper-outer quadrant of right female breast: Secondary | ICD-10-CM | POA: Diagnosis not present

## 2022-09-21 DIAGNOSIS — D701 Agranulocytosis secondary to cancer chemotherapy: Secondary | ICD-10-CM | POA: Insufficient documentation

## 2022-09-21 DIAGNOSIS — Z5112 Encounter for antineoplastic immunotherapy: Secondary | ICD-10-CM | POA: Insufficient documentation

## 2022-09-21 DIAGNOSIS — Z5111 Encounter for antineoplastic chemotherapy: Secondary | ICD-10-CM | POA: Insufficient documentation

## 2022-09-21 DIAGNOSIS — R109 Unspecified abdominal pain: Secondary | ICD-10-CM | POA: Diagnosis not present

## 2022-09-21 DIAGNOSIS — Z9011 Acquired absence of right breast and nipple: Secondary | ICD-10-CM | POA: Diagnosis not present

## 2022-09-21 DIAGNOSIS — Z79899 Other long term (current) drug therapy: Secondary | ICD-10-CM | POA: Diagnosis not present

## 2022-09-21 DIAGNOSIS — Z7982 Long term (current) use of aspirin: Secondary | ICD-10-CM | POA: Insufficient documentation

## 2022-09-21 LAB — COMPREHENSIVE METABOLIC PANEL
ALT: 14 U/L (ref 0–44)
AST: 17 U/L (ref 15–41)
Albumin: 3.5 g/dL (ref 3.5–5.0)
Alkaline Phosphatase: 80 U/L (ref 38–126)
Anion gap: 7 (ref 5–15)
BUN: 8 mg/dL (ref 8–23)
CO2: 25 mmol/L (ref 22–32)
Calcium: 9 mg/dL (ref 8.9–10.3)
Chloride: 106 mmol/L (ref 98–111)
Creatinine, Ser: 0.76 mg/dL (ref 0.44–1.00)
GFR, Estimated: >60 mL/min (ref >60)
Glucose, Bld: 117 mg/dL — ABNORMAL HIGH (ref 70–99)
Potassium: 3.9 mmol/L (ref 3.5–5.1)
Sodium: 138 mmol/L (ref 135–145)
Total Bilirubin: 0.5 mg/dL (ref 0.3–1.2)
Total Protein: 6.8 g/dL (ref 6.5–8.1)

## 2022-09-21 LAB — CBC WITH DIFFERENTIAL/PLATELET
Abs Immature Granulocytes: 0.04 10*3/uL (ref 0.00–0.07)
Basophils Absolute: 0 10*3/uL (ref 0.0–0.1)
Basophils Relative: 1 %
Eosinophils Absolute: 0 10*3/uL (ref 0.0–0.5)
Eosinophils Relative: 0 %
HCT: 29.8 % — ABNORMAL LOW (ref 36.0–46.0)
Hemoglobin: 9.5 g/dL — ABNORMAL LOW (ref 12.0–15.0)
Immature Granulocytes: 1 %
Lymphocytes Relative: 20 %
Lymphs Abs: 1 10*3/uL (ref 0.7–4.0)
MCH: 30.1 pg (ref 26.0–34.0)
MCHC: 31.9 g/dL (ref 30.0–36.0)
MCV: 94.3 fL (ref 80.0–100.0)
Monocytes Absolute: 0.5 10*3/uL (ref 0.1–1.0)
Monocytes Relative: 10 %
Neutro Abs: 3.6 10*3/uL (ref 1.7–7.7)
Neutrophils Relative %: 68 %
Platelets: 350 10*3/uL (ref 150–400)
RBC: 3.16 MIL/uL — ABNORMAL LOW (ref 3.87–5.11)
RDW: 16.5 % — ABNORMAL HIGH (ref 11.5–15.5)
WBC: 5.2 10*3/uL (ref 4.0–10.5)
nRBC: 0 % (ref 0.0–0.2)

## 2022-09-21 LAB — MAGNESIUM: Magnesium: 1.5 mg/dL — ABNORMAL LOW (ref 1.7–2.4)

## 2022-09-21 LAB — TSH: TSH: 2.252 u[IU]/mL (ref 0.350–4.500)

## 2022-09-21 MED ORDER — DEXAMETHASONE SODIUM PHOSPHATE 10 MG/ML IJ SOLN
6.0000 mg | Freq: Once | INTRAMUSCULAR | Status: AC
  Administered 2022-09-21: 6 mg via INTRAVENOUS
  Filled 2022-09-21: qty 1

## 2022-09-21 MED ORDER — SODIUM CHLORIDE 0.9 % IV SOLN
600.0000 mg/m2 | Freq: Once | INTRAVENOUS | Status: AC
  Administered 2022-09-21: 1220 mg via INTRAVENOUS
  Filled 2022-09-21: qty 61

## 2022-09-21 MED ORDER — SODIUM CHLORIDE 0.9 % IV SOLN
150.0000 mg | Freq: Once | INTRAVENOUS | Status: AC
  Administered 2022-09-21: 150 mg via INTRAVENOUS
  Filled 2022-09-21: qty 150

## 2022-09-21 MED ORDER — SODIUM CHLORIDE 0.9 % IV SOLN
200.0000 mg | Freq: Once | INTRAVENOUS | Status: AC
  Administered 2022-09-21: 200 mg via INTRAVENOUS
  Filled 2022-09-21: qty 8

## 2022-09-21 MED ORDER — SODIUM CHLORIDE 0.9 % IV SOLN
Freq: Once | INTRAVENOUS | Status: AC
  Filled 2022-09-21: qty 250

## 2022-09-21 MED ORDER — HEPARIN SOD (PORK) LOCK FLUSH 100 UNIT/ML IV SOLN
500.0000 [IU] | Freq: Once | INTRAVENOUS | Status: AC | PRN
  Administered 2022-09-21: 500 [IU]
  Filled 2022-09-21: qty 5

## 2022-09-21 MED ORDER — SODIUM CHLORIDE 0.9 % IV SOLN: 6.0000 mg | Freq: Once | INTRAVENOUS | Status: DC

## 2022-09-21 MED ORDER — DOXORUBICIN HCL CHEMO IV INJECTION 2 MG/ML
60.0000 mg/m2 | Freq: Once | INTRAVENOUS | Status: AC
  Administered 2022-09-21: 122 mg via INTRAVENOUS
  Filled 2022-09-21: qty 61

## 2022-09-21 MED ORDER — PALONOSETRON HCL INJECTION 0.25 MG/5ML
0.2500 mg | Freq: Once | INTRAVENOUS | Status: AC
  Administered 2022-09-21: 0.25 mg via INTRAVENOUS
  Filled 2022-09-21: qty 5

## 2022-09-21 NOTE — Progress Notes (Signed)
[place] CONSULT NOTE  Patient Care Team: Steele Sizer, MD as PCP - General (Family Medicine) Bary Castilla, Forest Gleason, MD (General Surgery) Carloyn Manner, MD as Referring Physician (Otolaryngology) Yolonda Kida, MD as Consulting Physician (Cardiology) Ree Edman, MD as Referring Physician (Dermatology) Lonia Farber, MD as Consulting Physician (Internal Medicine) Cammie Sickle, MD as Consulting Physician (Internal Medicine)  CHIEF COMPLAINTS/PURPOSE OF CONSULTATION: Breast cancer  #  Oncology History Overview Note  She has a remote history of right breast cancer, T1bN0, ER positive,s/p lumpectomy and MammoSite radiation and 5 years of Arimidex.    02/19/22 screening mammogram bilaterally showed  1. Suspicious right breast mass at the 10 o'clock position 8 cm from the nipple. Recommend ultrasound-guided biopsy. 2. Indeterminate right breast mass at the 6 o'clock position 4 cm from the nipple. Recommend ultrasound-guided biopsy. 3. No suspicious right axillary lymphadenopathy.   04/12/2022 Unilateral right breast diagnostic mammogram showed 1. Suspicious right breast mass at the 10 o'clock position 8 cm from the nipple. Recommend ultrasound-guided biopsy. 2. Indeterminate right breast mass at the 6 o'clock position 4 cm from the nipple. Recommend ultrasound-guided biopsy. 3. No suspicious right axillary lymphadenopathy.   04/13/2022 right breast mass 6 o'clock position 1 cm from the nipple showed invasive mammary carcinoma, no special type. Grade 3, DCIS present high grade, LVI not identified. ER-, PR 1-10%, HER 2 -   04/13/2022, right breast mass 10:00 7 cm from nipple biopsy showed invasive mammary carcinoma, grade 3, high-grade DCIS with focal comedonecrosis, lymphovascular invasion not identified.  ER -, PR 1 to 10%, HER2-    04/26/2022, right breast mastectomy with axillary dissection Invasive mammary carcinoma, no special type, multifocal, DCIS  high-grade, benign nipple/areola.  2 deposits of invasive mammary carcinoma, no definite residual lymph node identified.   Breast cancer ([place])  05/10/2022 Initial Diagnosis   Breast cancer ([place])   05/10/2022 Cancer Staging   Staging form: Breast, AJCC 8th Edition - Pathologic stage from 05/10/2022: Stage IIA (pT1c, pN1a, cM0, G3, ER-, PR+, HER2-) - Signed by Earlie Server, MD on 05/10/2022 Stage prefix: Initial diagnosis Histologic grading system: 3 grade system   05/24/2022 - 05/24/2022 Chemotherapy   Patient is on Treatment Plan : BREAST Pembrolizumab (200) D1 + Carboplatin (5) D1 + Paclitaxel (80) D1,8,15 q21d X 4 cycles / Pembrolizumab (200) D1 + AC D1 q21d x 4 cycles     05/27/2022 - 08/13/2022 Chemotherapy   Patient is on Treatment Plan : BREAST Pembrolizumab (200) D1 + Carboplatin (5) D1 + Paclitaxel (80) D1,8,15 q21d X 4 cycles / Pembrolizumab (200) D1 + AC D1 q21d x 4 cycles     05/27/2022 -  Chemotherapy   Patient is on Treatment Plan : BREAST Pembrolizumab (200) D1 + Carboplatin (5) D1 + Paclitaxel (80) D1,8,15 q21d X 4 cycles / Pembrolizumab (200) D1 + AC D1 q21d x 4 cycles     Carcinoma of upper-outer quadrant of right breast in female, estrogen receptor negative ([place])  05/26/2022 Initial Diagnosis   Carcinoma of upper-outer quadrant of right breast in female, estrogen receptor negative ([place])   05/26/2022 Cancer Staging   Staging form: Breast, AJCC 8th Edition - Pathologic: Stage IIA (pT1c, pN1, cM0, G3, ER-, PR-, HER2-) - Signed by Cammie Sickle, MD on 05/26/2022 Multigene prognostic tests performed: None Histologic grading system: 3 grade system   05/27/2022 -  Chemotherapy   Patient is on Treatment Plan : BREAST Pembrolizumab (200) D1 + Carboplatin (5) D1 + Paclitaxel (80)  D1,8,15 q21d X 4 cycles / Pembrolizumab (200) D1 + AC D1 q21d x 4 cycles       HISTORY OF PRESENTING ILLNESS: Ambulating independently.  Alone.  Morgan Peterson 71 y.o.  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  Status post cycle #1 of Adriamycin Cytoxan-Keytruda. 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  She is not on gabapentin.  Appetite is improving with Marinol.   Patient needed IV fluids posttreatment.-For ongoing nausea.  Appetite is good.  No weight loss.  Complains of ongoing fatigue-improved.  Started taking iron tablets  Review of Systems  Constitutional:  Positive for malaise/fatigue. Negative for chills, diaphoresis, fever and weight loss.  HENT:  Negative for nosebleeds and sore throat.   Eyes:  Negative for double vision.  Respiratory:  Negative for cough, hemoptysis, sputum production, shortness of breath and wheezing.   Cardiovascular:  Negative for chest pain, palpitations, orthopnea and leg swelling.  Gastrointestinal:  Negative for abdominal pain, blood in stool, constipation, diarrhea, heartburn, melena, nausea and vomiting.  Genitourinary:  Negative for dysuria, frequency and urgency.  Musculoskeletal:  Negative for back pain and joint pain.  Skin: Negative.  Negative for itching and rash.  Neurological:  Positive for tingling. Negative for dizziness, focal weakness, weakness and headaches.  Endo/Heme/Allergies:  Does not bruise/bleed easily.  Psychiatric/Behavioral:  Negative for depression. The patient is not nervous/anxious and does not have insomnia.      MEDICAL HISTORY:  Past Medical History:  Diagnosis Date   Appetite loss    Cervical radiculopathy    Chemotherapy-induced nausea    Hiatal hernia    Hypercholesterolemia    Hyperlipidemia    Hypertension    IBS (irritable bowel syndrome)    Malignant neoplasm of upper-outer quadrant of female breast (HCC) 04/11/2009   Right breast, invasive ductal carcinoma, 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 Arimidex  therapy completed September 2015   Mild mitral valve prolapse    Personal history of malignant neoplasm of breast 2010   Personal history of radiation therapy 2010   mammosite   T2DM (type 2 diabetes mellitus) (HCC) 2011    SURGICAL HISTORY: Past Surgical History:  Procedure Laterality Date   ABDOMINAL EXPLORATION SURGERY  2000   ovarian cyst   BREAST BIOPSY Right 2010   +    BREAST EXCISIONAL BIOPSY Right 2010   + mammo site inasive mammo ca   BREAST LUMPECTOMY Right 2010   [facility]   COLONOSCOPY  07/19/2012   Normal exam, Dr. [name]   COLONOSCOPY WITH PROPOFOL N/A 04/21/2022   Procedure: COLONOSCOPY WITH PROPOFOL;  Surgeon: [name], Jeffrey W, MD;  Location: [facility] ENDOSCOPY;  Service: Endoscopy;  Laterality: N/A;   MASTECTOMY Right 2010   partial/ lumpectomy   PORTACATH PLACEMENT Left 05/19/2022   Procedure: INSERTION PORT-A-CATH;  Surgeon: [name], Jeffrey W, MD;  Location: [facility] ORS;  Service: General;  Laterality: Left;   SIMPLE MASTECTOMY WITH AXILLARY SENTINEL NODE BIOPSY Right 04/26/2022   Procedure: SIMPLE MASTECTOMY WITH AXILLARY SENTINEL NODE BIOPSY;  Surgeon: [name], Jeffrey W, MD;  Location: [facility] ORS;  Service: General;  Laterality: Right;  RNFA to assist   TUBAL LIGATION  20 years ago    SOCIAL HISTORY: Social History   Socioeconomic History   Marital status: Married    Spouse name: Antonio   Number   of children: 2   Years of education: Not on file   Highest education level: Not on file  Occupational History   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  Tobacco Use   Smoking status: Former    Packs/day: 0.25    Years: 10.00    Total pack years: 2.50    Types: Cigarettes    Start date: 66    Quit date: 1988    Years since quitting: 35.7   Smokeless tobacco: Never   Tobacco comments:    smoking cessation materials not required  Vaping Use   Vaping Use: Never used  Substance and Sexual Activity   Alcohol use: Yes    Comment: wine occ   Drug use: No    Sexual activity: Not Currently    Comment: husband has ED  Other Topics Concern   Not on file  Social History Narrative   Oncology nurse on the floor retired.;  Husband retired from [employer].  No smoking.   Social Determinants of Health   Financial Resource Strain: Low Risk  (12/31/2021)   Overall Financial Resource Strain (CARDIA)    Difficulty of Paying Living Expenses: Not hard at all  Food Insecurity: No Food Insecurity (12/31/2021)   Hunger Vital Sign    Worried About Running Out of Food in the Last Year: Never true    Ran Out of Food in the Last Year: Never true  Transportation Needs: No Transportation Needs (12/31/2021)   PRAPARE - Hydrologist (Medical): No    Lack of Transportation (Non-Medical): No  Physical Activity: Inactive (12/31/2021)   Exercise Vital Sign    Days of Exercise per Week: 0 days    Minutes of Exercise per Session: 0 min  Stress: No Stress Concern Present (12/31/2021)   [place]    Feeling of Stress : Only a little  Social Connections: Moderately Integrated (12/31/2021)   Social Connection and Isolation Panel [NHANES]    Frequency of Communication with Friends and Family: More than three times a week    Frequency of Social Gatherings with Friends and Family: Three times a week    Attends Religious Services: More than 4 times per year    Active Member of Clubs or Organizations: No    Attends Archivist Meetings: Never    Marital Status: Married  Human resources officer Violence: Not At Risk (12/31/2021)   Humiliation, Afraid, Rape, and Kick questionnaire    Fear of Current or Ex-Partner: No    Emotionally Abused: No    Physically Abused: No    Sexually Abused: No    FAMILY HISTORY: Family History  Problem Relation Age of Onset   Osteoporosis Mother    Diabetes Father    Kidney disease Father    Diabetes Brother    Breast cancer Maternal Aunt     Bladder Cancer Maternal Aunt    Cervical cancer Maternal Aunt    Colon cancer Maternal Uncle    Lung cancer Maternal Uncle     ALLERGIES:  is allergic to levaquin [levofloxacin in d5w], losartan, metoprolol, saxagliptin, amlodipine-olmesartan, canagliflozin, egg white [egg white (egg protein)], gabapentin, glipizide, gluten meal, lisinopril, metformin and related, milk (cow), milk-related compounds, shrimp extract allergy skin test, tramadol, actos [pioglitazone], atorvastatin, carvedilol, and prednisone.  MEDICATIONS:  Current Outpatient Medications  Medication Sig Dispense Refill   aspirin EC 81 MG tablet Take 1 tablet (81 mg total) by mouth  daily. 30 tablet 0   atenolol (TENORMIN) 25 MG tablet Take 1 tablet (25 mg total) by mouth daily. 90 tablet 0   azelastine (ASTELIN) 0.1 % nasal spray Place 2 sprays into both nostrils daily as needed for allergies or rhinitis. Use in each nostril as directed     betamethasone, augmented, (DIPROLENE) 0.05 % lotion Apply 1 application topically 2 (two) times daily.     Blood Glucose Monitoring Suppl (ONETOUCH VERIO) w/Device KIT 1 Device by Does not apply route once. 1 kit 0   cholecalciferol (VITAMIN D) 1000 UNITS tablet Take 1,000 Units by mouth daily.     Cyanocobalamin (VITAMIN B-12 PO) Take 1 tablet by mouth daily as needed (fatigue).     dronabinol (MARINOL) 2.5 MG capsule Take 1 capsule (2.5 mg total) by mouth daily before lunch. 60 capsule 0   famotidine (PEPCID) 20 MG tablet Take 20 mg by mouth 2 (two) times daily.     levocetirizine (XYZAL) 5 MG tablet Take 1 tablet (5 mg total) by mouth every evening. (Patient taking differently: Take 5 mg by mouth at bedtime as needed.) 90 tablet 1   lidocaine-prilocaine (EMLA) cream Apply 1 application. topically as needed. Apply to port and cover with saran wrap 1-2 hours prior to port access 30 g 1   magnesium oxide (MAG-OX) 400 (240 Mg) MG tablet Take 1 tablet (400 mg total) by mouth daily. 15 tablet 0    montelukast (SINGULAIR) 10 MG tablet Take 1 tablet (10 mg total) by mouth at bedtime. (Patient taking differently: Take 10 mg by mouth at bedtime as needed.) 90 tablet 1   Multiple Vitamin (MULTIVITAMIN WITH MINERALS) TABS tablet Take 1 tablet by mouth 2 (two) times a week.     ondansetron (ZOFRAN) 8 MG tablet Take 1 tablet (8 mg total) by mouth every 8 (eight) hours as needed for nausea or vomiting. 40 tablet 1   OZEMPIC, 0.25 OR 0.5 MG/DOSE, 2 MG/1.5ML SOPN Inject 0.5 mg into the skin every Sunday.     promethazine (PHENERGAN) 25 MG tablet Take 1 tablet (25 mg total) by mouth every 6 (six) hours as needed for nausea or vomiting. 40 tablet 0   rizatriptan (MAXALT-MLT) 10 MG disintegrating tablet Take 5-10 mg by mouth as needed for migraine. May repeat in 2 hours if needed     rosuvastatin (CRESTOR) 10 MG tablet Take 1 tablet (10 mg total) by mouth every morning. 90 tablet 1   spironolactone (ALDACTONE) 50 MG tablet Take 1 tablet (50 mg total) by mouth daily. 90 tablet 0   EPINEPHrine (EPIPEN 2-PAK) 0.3 mg/0.3 mL IJ SOAJ injection Inject 0.3 mg into the muscle as needed for anaphylaxis. (Patient not taking: Reported on 09/03/2022) 2 each 0   glucose blood (ONETOUCH VERIO) test strip Use as instructed 100 each 12   Lancets (ONETOUCH ULTRASOFT) lancets Use as instructed 100 each 3   OLANZapine (ZYPREXA) 10 MG tablet Take 0.5-1 tablets (5-10 mg total) by mouth at bedtime as needed (nausea). (Patient not taking: Reported on 09/21/2022) 30 tablet 1   pantoprazole (PROTONIX) 20 MG tablet Take 1 tablet (20 mg total) by mouth daily. (Patient not taking: Reported on 09/21/2022) 30 tablet 1   No current facility-administered medications for this visit.      .  PHYSICAL EXAMINATION: ECOG PERFORMANCE STATUS: 0 - Asymptomatic  Vitals:   09/21/22 0800  BP: 112/68  Pulse: 75  Resp: 18  Temp: 98.4 F (36.9 C)   Filed Weights     09/21/22 0800  Weight: 193 lb 9.6 oz (87.8 kg)    Physical Exam Vitals  and nursing note reviewed.  HENT:     Head: Normocephalic and atraumatic.     Mouth/Throat:     Pharynx: Oropharynx is clear.  Eyes:     Extraocular Movements: Extraocular movements intact.     Pupils: Pupils are equal, round, and reactive to light.  Cardiovascular:     Rate and Rhythm: Normal rate and regular rhythm.  Pulmonary:     Comments: Decreased breath sounds bilaterally.  Abdominal:     Palpations: Abdomen is soft.  Musculoskeletal:        General: Normal range of motion.     Cervical back: Normal range of motion.  Skin:    General: Skin is warm.  Neurological:     General: No focal deficit present.     Mental Status: She is alert and oriented to person, place, and time.  Psychiatric:        Behavior: Behavior normal.        Judgment: Judgment normal.      LABORATORY DATA:  I have reviewed the data as listed Lab Results  Component Value Date   WBC 5.2 09/21/2022   HGB 9.5 (L) 09/21/2022   HCT 29.8 (L) 09/21/2022   MCV 94.3 09/21/2022   PLT 350 09/21/2022   Recent Labs    08/31/22 1051 09/03/22 1306 09/10/22 0935 09/17/22 0913 09/21/22 0756  NA 137 139 137 139 138  K 3.9 3.7 3.3* 3.8 3.9  CL 106 108 106 109 106  CO2 _0 GLUCOSE 170* 131* 172* 123* 117*  BUN _1 7* 8  CREATININE 0.87 0.94 0.84 0.79 0.76  CALCIUM 9.1 8.9 8.5* 9.0 9.0  GFRNONAA >60 >60 >60 >60 >60  PROT 6.8 7.0  --   --  6.8  ALBUMIN 3.5 3.4*  --   --  3.5  AST 22 16  --   --  17  ALT 16 12  --   --  14  ALKPHOS 88 86  --   --  80  BILITOT 0.5 0.6  --   --  0.5     RADIOGRAPHIC STUDIES: I have personally reviewed the radiological images as listed and agreed with the findings in the report. No results found.  ASSESSMENT & PLAN:   No problem-specific Assessment & Plan notes found for this encounter.     All questions were answered. The patient/family knows to call the clinic with any problems, questions or concerns.       Cammie Sickle,  MD 09/21/2022 8:55 AM

## 2022-09-21 NOTE — Progress Notes (Signed)
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  Appetite is improving with Marinol.

## 2022-09-21 NOTE — Assessment & Plan Note (Addendum)
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  Patient currently on adjuvant chemoimmunotherapy [[id number] 779 study]; patient s/p carboplatin weekly Taxol.  Currently on Adriamycin-Cytoxan Keytruda.   # Proceed with cycle #2. Adriamcycin- Cytoxan- Keytruda- moderate anemia/fatigue- June 6th- ECHO- 05/25/2022- 65-70%. TSH AUG 2023-WNL.   # Nausea- no vomiting- needing IVFs in clinic.  Encourage antiemetics.  And also IV fluids on a weekly basis.  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   #Hypomagnesemia magnesium 1.5-recommend increasing mag oxide to twice a day.  # DM-we will cut down the steroids to 6 mg.  Blood sugars today 116 [Orange juice]- on ozempic.  Monitor blood sugars closely. STABLE.   # DISPOSITION: # Adriamycin-cytoxan-Keytruda today.  # Thursday/fridays of this week & next week- IVFs over 1 hour  # Follow up MD- in 3 weeks-;labs- cbc/cmp;mag Adriamycin-Cytoxan--keytruda; next day- udenyca- Dr.B

## 2022-09-21 NOTE — Progress Notes (Signed)
Nutrition Follow-up:  Identified on Malnutrition Screening report for weight loss  Patient with breast cancer.  Currently receiving chemotherapy.    Met with patient during infusion.  Patient reports that appetite is better this week and last week.  Marinol is helping.  Had issues with nausea.     Medications: reviewed  Labs: reviewed  Anthropometrics:   Weight 193 lb 9.6 oz on 10/3 197 lb on 8/22 194 lb on 07/27/22 (last seen by RD)  2% weight loss in the last month and half   NUTRITION DIAGNOSIS: Inadequate oral intake related to cancer related treatment side effects as evidenced by weight loss and nausea.     INTERVENTION:  Discussed strategies to help with nausea.  Handout provided of foods to choose.     MONITORING, EVALUATION, GOAL: weight trends, intake   NEXT VISIT: as needed  Morgan Peterson, [place], [place] Registered Dietitian [phone]

## 2022-09-21 NOTE — Patient Instructions (Signed)
[facility]  Discharge Instructions: Thank you for choosing [facility] to provide your oncology and hematology care.  If you have a lab appointment with the Cancer Center, please go directly to the Cancer Center and check in at the registration area.  Wear comfortable clothing and clothing appropriate for easy access to any Portacath or PICC line.   We strive to give you quality time with your provider. You may need to reschedule your appointment if you arrive late (15 or more minutes).  Arriving late affects you and other patients whose appointments are after yours.  Also, if you miss three or more appointments without notifying the office, you may be dismissed from the clinic at the provider's discretion.      For prescription refill requests, have your pharmacy contact our office and allow 72 hours for refills to be completed.       To help prevent nausea and vomiting after your treatment, we encourage you to take your nausea medication as directed.  BELOW ARE SYMPTOMS THAT SHOULD BE REPORTED IMMEDIATELY: *FEVER GREATER THAN 100.4 F (38 C) OR HIGHER *CHILLS OR SWEATING *NAUSEA AND VOMITING THAT IS NOT CONTROLLED WITH YOUR NAUSEA MEDICATION *UNUSUAL SHORTNESS OF BREATH *UNUSUAL BRUISING OR BLEEDING *URINARY PROBLEMS (pain or burning when urinating, or frequent urination) *BOWEL PROBLEMS (unusual diarrhea, constipation, pain near the anus) TENDERNESS IN MOUTH AND THROAT WITH OR WITHOUT PRESENCE OF ULCERS (sore throat, sores in mouth, or a toothache) UNUSUAL RASH, SWELLING OR PAIN  UNUSUAL VAGINAL DISCHARGE OR ITCHING   Items with * indicate a potential emergency and should be followed up as soon as possible or go to the Emergency Department if any problems should occur.  Please show the CHEMOTHERAPY ALERT CARD or IMMUNOTHERAPY ALERT CARD at check-in to the Emergency Department and triage nurse.  Should you have questions after your  visit or need to cancel or reschedule your appointment, please contact [facility]  [phone] and follow the prompts.  Office hours are 8:00 a.m. to 4:30 p.m. Monday - Friday. Please note that voicemails left after 4:00 p.m. may not be returned until the following business day.  We are closed weekends and major holidays. You have access to a nurse at all times for urgent questions. Please call the main number to the clinic [phone] and follow the prompts.  For any non-urgent questions, you may also contact your provider using MyChart. We now offer e-Visits for anyone 18 and older to request care online for non-urgent symptoms. For details visit [email or web address].   Also download the MyChart app! Go to the app store, search "MyChart", open the app, select [place], and log in with your MyChart username and password.  Masks are optional in the cancer centers. If you would like for your care team to wear a mask while they are taking care of you, please let them know. For doctor visits, patients may have with them one support person who is at least 71 years old. At this time, visitors are not allowed in the infusion area.   

## 2022-09-22 ENCOUNTER — Inpatient Hospital Stay: Payer: Medicare Other

## 2022-09-22 DIAGNOSIS — Z5112 Encounter for antineoplastic immunotherapy: Secondary | ICD-10-CM | POA: Diagnosis not present

## 2022-09-22 DIAGNOSIS — Z171 Estrogen receptor negative status [ER-]: Secondary | ICD-10-CM

## 2022-09-22 DIAGNOSIS — C50811 Malignant neoplasm of overlapping sites of right female breast: Secondary | ICD-10-CM

## 2022-09-22 LAB — T4: T4, Total: 6.5 ug/dL (ref 4.5–12.0)

## 2022-09-22 MED ORDER — PEGFILGRASTIM-CBQV 6 MG/0.6ML ~~LOC~~ SOSY
6.0000 mg | PREFILLED_SYRINGE | Freq: Once | SUBCUTANEOUS | Status: AC
  Administered 2022-09-22: 6 mg via SUBCUTANEOUS
  Filled 2022-09-22: qty 0.6

## 2022-09-23 ENCOUNTER — Ambulatory Visit: Payer: Medicare Other

## 2022-09-24 ENCOUNTER — Inpatient Hospital Stay: Payer: Medicare Other

## 2022-09-24 ENCOUNTER — Other Ambulatory Visit: Payer: Self-pay | Admitting: *Deleted

## 2022-09-24 VITALS — BP 114/71 | HR 78 | Temp 98.2°F | Resp 18

## 2022-09-24 DIAGNOSIS — R110 Nausea: Secondary | ICD-10-CM

## 2022-09-24 DIAGNOSIS — E860 Dehydration: Secondary | ICD-10-CM

## 2022-09-24 DIAGNOSIS — Z5112 Encounter for antineoplastic immunotherapy: Secondary | ICD-10-CM | POA: Diagnosis not present

## 2022-09-24 LAB — LIPASE, BLOOD: Lipase: 25 U/L (ref 11–51)

## 2022-09-24 LAB — AMYLASE: Amylase: 55 U/L (ref 28–100)

## 2022-09-24 MED ORDER — HEPARIN SOD (PORK) LOCK FLUSH 100 UNIT/ML IV SOLN
500.0000 [IU] | Freq: Once | INTRAVENOUS | Status: AC
  Administered 2022-09-24: 500 [IU] via INTRAVENOUS
  Filled 2022-09-24: qty 5

## 2022-09-24 MED ORDER — ONDANSETRON HCL 4 MG/2ML IJ SOLN
8.0000 mg | Freq: Once | INTRAMUSCULAR | Status: AC
  Administered 2022-09-24: 8 mg via INTRAVENOUS
  Filled 2022-09-24: qty 4

## 2022-09-24 MED ORDER — SODIUM CHLORIDE 0.9 % IV SOLN
Freq: Once | INTRAVENOUS | Status: AC
  Filled 2022-09-24: qty 250

## 2022-09-24 MED ORDER — FAMOTIDINE IN NACL 20-0.9 MG/50ML-% IV SOLN
20.0000 mg | Freq: Once | INTRAVENOUS | Status: AC
  Administered 2022-09-24: 20 mg via INTRAVENOUS
  Filled 2022-09-24: qty 50

## 2022-09-24 MED ORDER — SODIUM CHLORIDE 0.9 % IV SOLN: 8.0000 mg | Freq: Once | INTRAVENOUS | Status: DC

## 2022-09-24 MED ORDER — SODIUM CHLORIDE 0.9% FLUSH
10.0000 mL | Freq: Once | INTRAVENOUS | Status: AC
  Administered 2022-09-24: 10 mL via INTRAVENOUS
  Filled 2022-09-24: qty 10

## 2022-09-24 NOTE — Progress Notes (Signed)
Has noticed adriamycin treatments are making her stomach hurt. Every Thurs after treatment her stomach starts hurting. Nauseated as well. Receiving IV hydration and nausea medicine. Pepcid given. Pt reports feeling better. Going to go home and eat some soup. Discharged to home.

## 2022-09-27 ENCOUNTER — Other Ambulatory Visit: Payer: Self-pay | Admitting: *Deleted

## 2022-09-27 ENCOUNTER — Telehealth: Payer: Self-pay | Admitting: *Deleted

## 2022-09-27 DIAGNOSIS — E860 Dehydration: Secondary | ICD-10-CM

## 2022-09-27 DIAGNOSIS — R110 Nausea: Secondary | ICD-10-CM

## 2022-09-27 DIAGNOSIS — E876 Hypokalemia: Secondary | ICD-10-CM

## 2022-09-27 MED ORDER — OLANZAPINE 10 MG PO TABS: 5.0000 mg | ORAL_TABLET | Freq: Every evening | ORAL | 1 refills | Status: DC | PRN

## 2022-09-27 MED ORDER — MAGNESIUM OXIDE -MG SUPPLEMENT 400 (240 MG) MG PO TABS: 400.0000 mg | ORAL_TABLET | Freq: Every day | ORAL | 0 refills | Status: DC

## 2022-09-27 MED ORDER — PANTOPRAZOLE SODIUM 20 MG PO TBEC: 20.0000 mg | DELAYED_RELEASE_TABLET | Freq: Every day | ORAL | 1 refills | Status: DC

## 2022-09-27 NOTE — Telephone Encounter (Signed)
Call returned to patient and had to leave voice mail message for her to come in at  tomorrow AM for lab/ Symptom Management Clinic/ IVF

## 2022-09-27 NOTE — Telephone Encounter (Addendum)
Incoming rf request from [place] for pantoprazole, mag-ox and zyprexa

## 2022-09-27 NOTE — Telephone Encounter (Signed)
Patient called back and states that her COVID test is negative. She states she has not felt well since her treatment Thursday

## 2022-09-27 NOTE — Telephone Encounter (Signed)
Patient called and left message that she has been have severe abdominal pain, nausea and headache since Saturday. I returned call to patient and got voice mail. I had asked that she test for COVID and return my call with results

## 2022-09-28 ENCOUNTER — Other Ambulatory Visit: Payer: Self-pay

## 2022-09-28 ENCOUNTER — Inpatient Hospital Stay (HOSPITAL_BASED_OUTPATIENT_CLINIC_OR_DEPARTMENT_OTHER): Payer: Medicare Other | Admitting: Hospice and Palliative Medicine

## 2022-09-28 ENCOUNTER — Other Ambulatory Visit: Payer: Self-pay | Admitting: Internal Medicine

## 2022-09-28 ENCOUNTER — Encounter: Payer: Self-pay | Admitting: Hospice and Palliative Medicine

## 2022-09-28 ENCOUNTER — Inpatient Hospital Stay: Payer: Medicare Other

## 2022-09-28 VITALS — BP 122/78 | HR 107 | Temp 97.9°F

## 2022-09-28 VITALS — Resp 18

## 2022-09-28 DIAGNOSIS — R110 Nausea: Secondary | ICD-10-CM

## 2022-09-28 DIAGNOSIS — Z171 Estrogen receptor negative status [ER-]: Secondary | ICD-10-CM | POA: Diagnosis not present

## 2022-09-28 DIAGNOSIS — Z95828 Presence of other vascular implants and grafts: Secondary | ICD-10-CM

## 2022-09-28 DIAGNOSIS — E860 Dehydration: Secondary | ICD-10-CM

## 2022-09-28 DIAGNOSIS — E876 Hypokalemia: Secondary | ICD-10-CM

## 2022-09-28 DIAGNOSIS — C50811 Malignant neoplasm of overlapping sites of right female breast: Secondary | ICD-10-CM

## 2022-09-28 DIAGNOSIS — Z5112 Encounter for antineoplastic immunotherapy: Secondary | ICD-10-CM | POA: Diagnosis not present

## 2022-09-28 LAB — CBC WITH DIFFERENTIAL/PLATELET
Abs Immature Granulocytes: 0.01 10*3/uL (ref 0.00–0.07)
Basophils Absolute: 0 10*3/uL (ref 0.0–0.1)
Basophils Relative: 7 %
Eosinophils Absolute: 0 10*3/uL (ref 0.0–0.5)
Eosinophils Relative: 0 %
HCT: 33.1 % — ABNORMAL LOW (ref 36.0–46.0)
Hemoglobin: 10.6 g/dL — ABNORMAL LOW (ref 12.0–15.0)
Immature Granulocytes: 4 %
Lymphocytes Relative: 86 %
Lymphs Abs: 0.3 10*3/uL — ABNORMAL LOW (ref 0.7–4.0)
MCH: 29.5 pg (ref 26.0–34.0)
MCHC: 32 g/dL (ref 30.0–36.0)
MCV: 92.2 fL (ref 80.0–100.0)
Monocytes Absolute: 0 10*3/uL — ABNORMAL LOW (ref 0.1–1.0)
Monocytes Relative: 3 %
Neutro Abs: 0 10*3/uL — CL (ref 1.7–7.7)
Neutrophils Relative %: 0 %
Platelets: 174 10*3/uL (ref 150–400)
RBC: 3.59 MIL/uL — ABNORMAL LOW (ref 3.87–5.11)
RDW: 15 % (ref 11.5–15.5)
Smear Review: NORMAL
WBC: 0.3 10*3/uL — CL (ref 4.0–10.5)
nRBC: 0 % (ref 0.0–0.2)

## 2022-09-28 LAB — COMPREHENSIVE METABOLIC PANEL
ALT: 12 U/L (ref 0–44)
AST: 14 U/L — ABNORMAL LOW (ref 15–41)
Albumin: 3.6 g/dL (ref 3.5–5.0)
Alkaline Phosphatase: 71 U/L (ref 38–126)
Anion gap: 9 (ref 5–15)
BUN: 21 mg/dL (ref 8–23)
CO2: 22 mmol/L (ref 22–32)
Calcium: 8.7 mg/dL — ABNORMAL LOW (ref 8.9–10.3)
Chloride: 105 mmol/L (ref 98–111)
Creatinine, Ser: 0.83 mg/dL (ref 0.44–1.00)
GFR, Estimated: >60 mL/min (ref >60)
Glucose, Bld: 194 mg/dL — ABNORMAL HIGH (ref 70–99)
Potassium: 4.1 mmol/L (ref 3.5–5.1)
Sodium: 136 mmol/L (ref 135–145)
Total Bilirubin: 0.8 mg/dL (ref 0.3–1.2)
Total Protein: 7 g/dL (ref 6.5–8.1)

## 2022-09-28 LAB — MAGNESIUM: Magnesium: 1.7 mg/dL (ref 1.7–2.4)

## 2022-09-28 MED ORDER — SODIUM CHLORIDE 0.9% FLUSH
10.0000 mL | Freq: Once | INTRAVENOUS | Status: AC
  Administered 2022-09-28: 10 mL via INTRAVENOUS
  Filled 2022-09-28: qty 10

## 2022-09-28 MED ORDER — CIPROFLOXACIN HCL 500 MG PO TABS: 500.0000 mg | ORAL_TABLET | Freq: Two times a day (BID) | ORAL | 0 refills | Status: DC

## 2022-09-28 MED ORDER — ONDANSETRON HCL 4 MG/2ML IJ SOLN
8.0000 mg | Freq: Once | INTRAMUSCULAR | Status: AC
  Administered 2022-09-28: 8 mg via INTRAVENOUS
  Filled 2022-09-28: qty 4

## 2022-09-28 MED ORDER — HEPARIN SOD (PORK) LOCK FLUSH 100 UNIT/ML IV SOLN
500.0000 [IU] | Freq: Once | INTRAVENOUS | Status: AC
  Administered 2022-09-28: 500 [IU]
  Filled 2022-09-28: qty 5

## 2022-09-28 MED ORDER — SODIUM CHLORIDE 0.9 % IV SOLN
INTRAVENOUS | Status: DC
  Filled 2022-09-28 (×2): qty 250

## 2022-09-28 NOTE — Progress Notes (Signed)
Symptom Management [place] at [facility] [phone] Fax:([phone]  Patient Care Team: Steele Sizer, MD as PCP - General (Family Medicine) Bary Castilla, Forest Gleason, MD (General Surgery) Carloyn Manner, MD as Referring Physician (Otolaryngology) Yolonda Kida, MD as Consulting Physician (Cardiology) Ree Edman, MD as Referring Physician (Dermatology) Lonia Farber, MD as Consulting Physician (Internal Medicine) Cammie Sickle, MD as Consulting Physician (Internal Medicine)   NAME OF PATIENT: Morgan Peterson  [id number]  1951-06-16   DATE OF VISIT: 09/28/22  REASON FOR CONSULT: Morgan Peterson is a 71 y.o. 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    INTERVAL HISTORY: Chemotherapy had been on hold due to anemia and fatigue but this was resumed and patient received cycle 5 on 08/31/22 and cycle 6 on 09/21/2022.  Patient has required intermittent SMC/supportive care for nausea following chemotherapy.  Today, she presents to [facility] for evaluation of fatigue, poor oral intake, nausea, and abdominal pain following her most recent chemotherapy.  She states that she has felt more rundown this cycle.  She had several days of abdominal pain but this seemed to resolve after patient had a couple of loose stools.  She denies any abdominal pain, fever, or chills today.  She does have intermittent nausea but is taking ondansetron and promethazine for that.  Oral intake has also reduced since last chemo cycle.  Denies any neurologic complaints. Denies urinary complaints. Patient offers no further specific complaints today.   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 Arimidex therapy completed September 2015   Mild mitral valve prolapse    Personal history of malignant neoplasm of breast 2010   Personal history of radiation therapy 2010   mammosite   T2DM (type 2 diabetes mellitus) ([place]) 2011    PAST SURGICAL HISTORY:  Past Surgical History:  Procedure Laterality Date   ABDOMINAL EXPLORATION SURGERY  2000   ovarian cyst   BREAST BIOPSY Right 2010   +    BREAST EXCISIONAL BIOPSY Right 2010   + mammo site inasive mammo ca   BREAST LUM[facility]OMY Right 2010   [facility]   COLONOSCOPY  07/19/2012   Normal exam, Dr. Bary Castilla   COLONOSCOPY WITH PROPOFOL N/A 04/21/2022   Procedure: COLONOSCOPY WITH PROPOFOL;  Surgeon: Robert Bellow, MD;  Location: [facility] ENDOSCOPY;  Service: Endoscopy;  Laterality: N/A;   MASTECTOMY Right 2010   partial/ lumpectomy   PORTACATH PLACEMENT Left 05/19/2022   Procedure: INSERTION PORT-A-CATH;  Surgeon: Robert Bellow, MD;  Location: [facility] ORS;  Service: General;  Laterality: Left;   SIMPLE MASTECTOMY WITH AXILLARY SENTINEL NODE BIOPSY Right 04/26/2022   Procedure: SIMPLE MASTECTOMY WITH AXILLARY SENTINEL NODE BIOPSY;  Surgeon: Robert Bellow, MD;  Location: [facility] ORS;  Service: General;  Laterality: Right;  RNFA to assist   TUBAL LIGATION  20 years ago    HEMATOLOGY/ONCOLOGY HISTORY:  Oncology History Overview Note  She  has a remote history of right breast cancer, T1bN0, ER positive,s/p lumpectomy and MammoSite radiation and 5 years of Arimidex.    02/19/22 screening mammogram bilaterally showed  1. Suspicious right breast mass at the 10 o'clock position 8 cm from the nipple. Recommend ultrasound-guided biopsy. 2.  Indeterminate right breast mass at the 6 o'clock position 4 cm from the nipple. Recommend ultrasound-guided biopsy. 3. No suspicious right axillary lymphadenopathy.   04/12/2022 Unilateral right breast diagnostic mammogram showed 1. Suspicious right breast mass at the 10 o'clock position 8 cm from the nipple. Recommend ultrasound-guided biopsy. 2. Indeterminate right breast mass at the 6 o'clock position 4 cm from the nipple. Recommend ultrasound-guided biopsy. 3. No suspicious right axillary lymphadenopathy.   04/13/2022 right breast mass 6 o'clock position 1 cm from the nipple showed invasive mammary carcinoma, no special type. Grade 3, DCIS present high grade, LVI not identified. ER-, PR 1-10%, HER 2 -   04/13/2022, right breast mass 10:00 7 cm from nipple biopsy showed invasive mammary carcinoma, grade 3, high-grade DCIS with focal comedonecrosis, lymphovascular invasion not identified.  ER -, PR 1 to 10%, HER2-    04/26/2022, right breast mastectomy with axillary dissection Invasive mammary carcinoma, no special type, multifocal, DCIS high-grade, benign nipple/areola.  2 deposits of invasive mammary carcinoma, no definite residual lymph node identified.   Breast cancer ([place])  05/10/2022 Initial Diagnosis   Breast cancer ([place])   05/10/2022 Cancer Staging   Staging form: Breast, AJCC 8th Edition - Pathologic stage from 05/10/2022: Stage IIA (pT1c, pN1a, cM0, G3, ER-, PR+, HER2-) - Signed by Earlie Server, MD on 05/10/2022 Stage prefix: Initial diagnosis Histologic grading system: 3 grade system   05/24/2022 - 05/24/2022 Chemotherapy   Patient is on Treatment Plan : BREAST Pembrolizumab (200) D1 + Carboplatin (5) D1 + Paclitaxel (80) D1,8,15 q21d X 4 cycles / Pembrolizumab (200) D1 + AC D1 q21d x 4 cycles     05/27/2022 - 08/13/2022 Chemotherapy   Patient is on Treatment Plan : BREAST Pembrolizumab (200) D1 + Carboplatin (5) D1 + Paclitaxel (80) D1,8,15 q21d X 4 cycles / Pembrolizumab (200) D1 + AC D1 q21d x  4 cycles     05/27/2022 -  Chemotherapy   Patient is on Treatment Plan : BREAST Pembrolizumab (200) D1 + Carboplatin (5) D1 + Paclitaxel (80) D1,8,15 q21d X 4 cycles / Pembrolizumab (200) D1 + AC D1 q21d x 4 cycles     Carcinoma of upper-outer quadrant of right breast in female, estrogen receptor negative ([place])  05/26/2022 Initial Diagnosis   Carcinoma of upper-outer quadrant of right breast in female, estrogen receptor negative ([place])   05/26/2022 Cancer Staging   Staging form: Breast, AJCC 8th Edition - Pathologic: Stage IIA (pT1c, pN1, cM0, G3, ER-, PR-, HER2-) - Signed by Cammie Sickle, MD on 05/26/2022 Multigene prognostic tests performed: None Histologic grading system: 3 grade system   05/27/2022 -  Chemotherapy   Patient is on Treatment Plan : BREAST Pembrolizumab (200) D1 + Carboplatin (5) D1 + Paclitaxel (80) D1,8,15 q21d X 4 cycles / Pembrolizumab (200) D1 + AC D1 q21d x 4 cycles       ALLERGIES:  is allergic to levaquin [levofloxacin in d5w], losartan, metoprolol, saxagliptin, amlodipine-olmesartan, canagliflozin, egg white [egg white (egg protein)], gabapentin, glipizide, gluten meal, lisinopril, metformin and related, milk (cow), milk-related compounds, shrimp extract allergy skin test, tramadol, actos [pioglitazone], atorvastatin, carvedilol, and prednisone.  MEDICATIONS:  Current Outpatient Medications  Medication Sig Dispense Refill   aspirin  EC 81 MG tablet Take 1 tablet (81 mg total) by mouth daily. 30 tablet 0   atenolol (TENORMIN) 25 MG tablet Take 1 tablet (25 mg total) by mouth daily. 90 tablet 0   azelastine (ASTELIN) 0.1 % nasal spray Place 2 sprays into both nostrils daily as needed for allergies or rhinitis. Use in each nostril as directed     betamethasone, augmented, (DIPROLENE) 0.05 % lotion Apply 1 application topically 2 (two) times daily.     Blood Glucose Monitoring Suppl (ONETOUCH VERIO) w/Device KIT 1 Device by Does not apply route once. 1 kit 0    cholecalciferol (VITAMIN D) 1000 UNITS tablet Take 1,000 Units by mouth daily.     Cyanocobalamin (VITAMIN B-12 PO) Take 1 tablet by mouth daily as needed (fatigue).     dronabinol (MARINOL) 2.5 MG capsule Take 1 capsule (2.5 mg total) by mouth daily before lunch. 60 capsule 0   EPINEPHrine (EPIPEN 2-PAK) 0.3 mg/0.3 mL IJ SOAJ injection Inject 0.3 mg into the muscle as needed for anaphylaxis. (Patient not taking: Reported on 09/03/2022) 2 each 0   famotidine (PEPCID) 20 MG tablet Take 20 mg by mouth 2 (two) times daily.     glucose blood (ONETOUCH VERIO) test strip Use as instructed 100 each 12   Lancets (ONETOUCH ULTRASOFT) lancets Use as instructed 100 each 3   levocetirizine (XYZAL) 5 MG tablet Take 1 tablet (5 mg total) by mouth every evening. (Patient taking differently: Take 5 mg by mouth at bedtime as needed.) 90 tablet 1   lidocaine-prilocaine (EMLA) cream Apply 1 application. topically as needed. Apply to port and cover with saran wrap 1-2 hours prior to port access 30 g 1   magnesium oxide (MAG-OX) 400 (240 Mg) MG tablet Take 1 tablet (400 mg total) by mouth daily. 15 tablet 0   montelukast (SINGULAIR) 10 MG tablet Take 1 tablet (10 mg total) by mouth at bedtime. (Patient taking differently: Take 10 mg by mouth at bedtime as needed.) 90 tablet 1   Multiple Vitamin (MULTIVITAMIN WITH MINERALS) TABS tablet Take 1 tablet by mouth 2 (two) times a week.     OLANZapine (ZYPREXA) 10 MG tablet Take 0.5-1 tablets (5-10 mg total) by mouth at bedtime as needed (nausea). 30 tablet 1   ondansetron (ZOFRAN) 8 MG tablet Take 1 tablet (8 mg total) by mouth every 8 (eight) hours as needed for nausea or vomiting. 40 tablet 1   OZEMPIC, 0.25 OR 0.5 MG/DOSE, 2 MG/1.5ML SOPN Inject 0.5 mg into the skin every Sunday.     pantoprazole (PROTONIX) 20 MG tablet Take 1 tablet (20 mg total) by mouth daily. 30 tablet 1   promethazine (PHENERGAN) 25 MG tablet Take 1 tablet (25 mg total) by mouth every 6 (six) hours as  needed for nausea or vomiting. 40 tablet 0   rizatriptan (MAXALT-MLT) 10 MG disintegrating tablet Take 5-10 mg by mouth as needed for migraine. May repeat in 2 hours if needed     rosuvastatin (CRESTOR) 10 MG tablet Take 1 tablet (10 mg total) by mouth every morning. 90 tablet 1   spironolactone (ALDACTONE) 50 MG tablet Take 1 tablet (50 mg total) by mouth daily. 90 tablet 0   No current facility-administered medications for this visit.    VITAL SIGNS: There were no vitals taken for this visit. There were no vitals filed for this visit.   Estimated body mass index is 31.25 kg/m as calculated from the following:   Height as of  09/21/22: _0  (1.676 m).   Weight as of 09/21/22: 193 lb 9.6 oz (87.8 kg).  LABS: CBC:    Component Value Date/Time   WBC 5.2 09/21/2022 0756   HGB 9.5 (L) 09/21/2022 0756   HGB 12.4 03/24/2015 1348   HCT 29.8 (L) 09/21/2022 0756   HCT 38.6 03/24/2015 1348   PLT 350 09/21/2022 0756   PLT 250 03/24/2015 1348   MCV 94.3 09/21/2022 0756   MCV 86 03/24/2015 1348   NEUTROABS 3.6 09/21/2022 0756   NEUTROABS 3.8 03/24/2015 1348   LYMPHSABS 1.0 09/21/2022 0756   LYMPHSABS 2.0 03/24/2015 1348   MONOABS 0.5 09/21/2022 0756   MONOABS 0.3 03/24/2015 1348   EOSABS 0.0 09/21/2022 0756   EOSABS 0.2 03/24/2015 1348   BASOSABS 0.0 09/21/2022 0756   BASOSABS 0.0 03/24/2015 1348   Comprehensive Metabolic Panel:    Component Value Date/Time   NA 138 09/21/2022 0756   NA 141 06/26/2019 0000   NA 137 03/24/2015 1348   K 3.9 09/21/2022 0756   K 3.5 03/24/2015 1348   CL 106 09/21/2022 0756   CL 101 03/24/2015 1348   CO2 25 09/21/2022 0756   CO2 31 03/24/2015 1348   BUN 8 09/21/2022 0756   BUN 9 06/26/2019 0000   BUN 11 03/24/2015 1348   CREATININE 0.76 09/21/2022 0756   CREATININE 0.81 12/31/2021 1005   GLUCOSE 117 (H) 09/21/2022 0756   GLUCOSE 162 (H) 03/24/2015 1348   CALCIUM 9.0 09/21/2022 0756   CALCIUM 8.7 (L) 03/24/2015 1348   AST 17 09/21/2022 0756    AST 19 03/24/2015 1348   ALT 14 09/21/2022 0756   ALT 16 03/24/2015 1348   ALKPHOS 80 09/21/2022 0756   ALKPHOS 112 03/24/2015 1348   BILITOT 0.5 09/21/2022 0756   BILITOT 0.4 09/13/2017 1025   BILITOT 0.3 03/24/2015 1348   PROT 6.8 09/21/2022 0756   PROT 7.0 09/13/2017 1025   PROT 7.2 03/24/2015 1348   ALBUMIN 3.5 09/21/2022 0756   ALBUMIN 3.9 09/13/2017 1025   ALBUMIN 3.6 03/24/2015 1348    RADIOGRAPHIC STUDIES: No results found.  PERFORMANCE STATUS (ECOG) : 1 - Symptomatic but completely ambulatory  Review of Systems Unless otherwise noted, a complete review of systems is negative.  Physical Exam General: NAD Cardiovascular: regular rate and rhythm Pulmonary: clear ant fields Abdomen: soft, nontender, + bowel sounds GU: no suprapubic tenderness Extremities: no edema, no joint deformities Skin: no rashes Neurological: Weakness but otherwise nonfocal  IMPRESSION/PLAN: Nausea -secondary to chemotherapy.  Continue antiemetics as needed.  IV fluids today.  Neutropenia -also secondary to chemotherapy.  ANC of 0 today.  Patient did receive dose of the Udenyca following last cycle of chemotherapy.  Discussed with Dr. Rogue Bussing and will start patient on Cipro prophylactically.  Of note, patient reports arthralgias with Levaquin but states that she has tolerated Cipro previously without difficulty.  Fatigue/poor oral intake -also likely secondary to effects from chemotherapy.  Continue supportive care.  Follow-up with dietitian.  RTC later this week for repeat labs/fluids  Patient expressed understanding and was in agreement with this plan. She also understands that She can call clinic at any time with any questions, concerns, or complaints.   Thank you for allowing me to participate in the care of this very pleasant patient.   Time Total: 20 minutes  Visit consisted of counseling and education dealing with the complex and emotionally intense issues of symptom management  in the setting of serious illness.Greater than 50%  of this time was spent counseling and coordinating care related to the above assessment and plan.  Signed by: Altha Harm, PhD, NP-C

## 2022-10-01 ENCOUNTER — Inpatient Hospital Stay: Payer: Medicare Other

## 2022-10-01 DIAGNOSIS — E860 Dehydration: Secondary | ICD-10-CM

## 2022-10-01 DIAGNOSIS — Z5112 Encounter for antineoplastic immunotherapy: Secondary | ICD-10-CM | POA: Diagnosis not present

## 2022-10-01 DIAGNOSIS — Z171 Estrogen receptor negative status [ER-]: Secondary | ICD-10-CM

## 2022-10-01 LAB — COMPREHENSIVE METABOLIC PANEL
ALT: 19 U/L (ref 0–44)
AST: 26 U/L (ref 15–41)
Albumin: 3.5 g/dL (ref 3.5–5.0)
Alkaline Phosphatase: 69 U/L (ref 38–126)
Anion gap: 8 (ref 5–15)
BUN: 14 mg/dL (ref 8–23)
CO2: 24 mmol/L (ref 22–32)
Calcium: 9.1 mg/dL (ref 8.9–10.3)
Chloride: 105 mmol/L (ref 98–111)
Creatinine, Ser: 0.94 mg/dL (ref 0.44–1.00)
GFR, Estimated: >60 mL/min (ref >60)
Glucose, Bld: 178 mg/dL — ABNORMAL HIGH (ref 70–99)
Potassium: 4.1 mmol/L (ref 3.5–5.1)
Sodium: 137 mmol/L (ref 135–145)
Total Bilirubin: 0.4 mg/dL (ref 0.3–1.2)
Total Protein: 7.1 g/dL (ref 6.5–8.1)

## 2022-10-01 LAB — CBC WITH DIFFERENTIAL/PLATELET
Abs Immature Granulocytes: 0.13 10*3/uL — ABNORMAL HIGH (ref 0.00–0.07)
Basophils Absolute: 0.1 10*3/uL (ref 0.0–0.1)
Basophils Relative: 5 %
Eosinophils Absolute: 0 10*3/uL (ref 0.0–0.5)
Eosinophils Relative: 1 %
HCT: 29.4 % — ABNORMAL LOW (ref 36.0–46.0)
Hemoglobin: 9.6 g/dL — ABNORMAL LOW (ref 12.0–15.0)
Immature Granulocytes: 11 %
Lymphocytes Relative: 28 %
Lymphs Abs: 0.3 10*3/uL — ABNORMAL LOW (ref 0.7–4.0)
MCH: 30.2 pg (ref 26.0–34.0)
MCHC: 32.7 g/dL (ref 30.0–36.0)
MCV: 92.5 fL (ref 80.0–100.0)
Monocytes Absolute: 0.1 10*3/uL (ref 0.1–1.0)
Monocytes Relative: 8 %
Neutro Abs: 0.6 10*3/uL — ABNORMAL LOW (ref 1.7–7.7)
Neutrophils Relative %: 47 %
Platelets: 74 10*3/uL — ABNORMAL LOW (ref 150–400)
RBC: 3.18 MIL/uL — ABNORMAL LOW (ref 3.87–5.11)
RDW: 14.2 % (ref 11.5–15.5)
Smear Review: NORMAL
WBC: 1.2 10*3/uL — CL (ref 4.0–10.5)
nRBC: 0 % (ref 0.0–0.2)

## 2022-10-01 MED ORDER — SODIUM CHLORIDE 0.9 % IV SOLN
Freq: Once | INTRAVENOUS | Status: AC
  Filled 2022-10-01: qty 250

## 2022-10-01 MED ORDER — HEPARIN SOD (PORK) LOCK FLUSH 100 UNIT/ML IV SOLN
500.0000 [IU] | Freq: Once | INTRAVENOUS | Status: AC
  Administered 2022-10-01: 500 [IU] via INTRAVENOUS
  Filled 2022-10-01: qty 5

## 2022-10-01 MED ORDER — SODIUM CHLORIDE 0.9% FLUSH
10.0000 mL | Freq: Once | INTRAVENOUS | Status: AC
  Administered 2022-10-01: 10 mL via INTRAVENOUS
  Filled 2022-10-01: qty 10

## 2022-10-01 NOTE — Progress Notes (Signed)
WBC 1.2. MD notified. Discussed results with patient. Reinforced neutropenic precautions. Receiving 1 L NS this afternoon.

## 2022-10-03 ENCOUNTER — Other Ambulatory Visit: Payer: Self-pay | Admitting: Family Medicine

## 2022-10-03 DIAGNOSIS — L509 Urticaria, unspecified: Secondary | ICD-10-CM

## 2022-10-04 ENCOUNTER — Other Ambulatory Visit: Payer: Self-pay | Admitting: *Deleted

## 2022-10-04 ENCOUNTER — Encounter: Payer: Self-pay | Admitting: Medical Oncology

## 2022-10-04 ENCOUNTER — Telehealth: Payer: Self-pay | Admitting: *Deleted

## 2022-10-04 ENCOUNTER — Other Ambulatory Visit: Payer: Self-pay

## 2022-10-04 ENCOUNTER — Inpatient Hospital Stay (HOSPITAL_BASED_OUTPATIENT_CLINIC_OR_DEPARTMENT_OTHER): Payer: Medicare Other | Admitting: Medical Oncology

## 2022-10-04 ENCOUNTER — Inpatient Hospital Stay: Payer: Medicare Other

## 2022-10-04 VITALS — BP 112/65 | HR 92 | Temp 98.0°F | Resp 20 | Ht 66.0 in | Wt 187.2 lb

## 2022-10-04 DIAGNOSIS — R110 Nausea: Secondary | ICD-10-CM | POA: Diagnosis not present

## 2022-10-04 DIAGNOSIS — Z171 Estrogen receptor negative status [ER-]: Secondary | ICD-10-CM

## 2022-10-04 DIAGNOSIS — Z95828 Presence of other vascular implants and grafts: Secondary | ICD-10-CM

## 2022-10-04 DIAGNOSIS — K921 Melena: Secondary | ICD-10-CM | POA: Diagnosis not present

## 2022-10-04 DIAGNOSIS — E860 Dehydration: Secondary | ICD-10-CM

## 2022-10-04 DIAGNOSIS — Z5112 Encounter for antineoplastic immunotherapy: Secondary | ICD-10-CM | POA: Diagnosis not present

## 2022-10-04 DIAGNOSIS — R1013 Epigastric pain: Secondary | ICD-10-CM

## 2022-10-04 DIAGNOSIS — C50411 Malignant neoplasm of upper-outer quadrant of right female breast: Secondary | ICD-10-CM

## 2022-10-04 LAB — COMPREHENSIVE METABOLIC PANEL
ALT: 18 U/L (ref 0–44)
AST: 25 U/L (ref 15–41)
Albumin: 3.4 g/dL — ABNORMAL LOW (ref 3.5–5.0)
Alkaline Phosphatase: 75 U/L (ref 38–126)
Anion gap: 8 (ref 5–15)
BUN: 11 mg/dL (ref 8–23)
CO2: 23 mmol/L (ref 22–32)
Calcium: 8.6 mg/dL — ABNORMAL LOW (ref 8.9–10.3)
Chloride: 106 mmol/L (ref 98–111)
Creatinine, Ser: 0.93 mg/dL (ref 0.44–1.00)
GFR, Estimated: >60 mL/min (ref >60)
Glucose, Bld: 180 mg/dL — ABNORMAL HIGH (ref 70–99)
Potassium: 3.7 mmol/L (ref 3.5–5.1)
Sodium: 137 mmol/L (ref 135–145)
Total Bilirubin: 0.5 mg/dL (ref 0.3–1.2)
Total Protein: 6.7 g/dL (ref 6.5–8.1)

## 2022-10-04 LAB — CBC WITH DIFFERENTIAL/PLATELET
Abs Immature Granulocytes: 0.28 10*3/uL — ABNORMAL HIGH (ref 0.00–0.07)
Basophils Absolute: 0.1 10*3/uL (ref 0.0–0.1)
Basophils Relative: 2 %
Eosinophils Absolute: 0 10*3/uL (ref 0.0–0.5)
Eosinophils Relative: 0 %
HCT: 26.9 % — ABNORMAL LOW (ref 36.0–46.0)
Hemoglobin: 8.9 g/dL — ABNORMAL LOW (ref 12.0–15.0)
Immature Granulocytes: 7 %
Lymphocytes Relative: 12 %
Lymphs Abs: 0.5 10*3/uL — ABNORMAL LOW (ref 0.7–4.0)
MCH: 30.8 pg (ref 26.0–34.0)
MCHC: 33.1 g/dL (ref 30.0–36.0)
MCV: 93.1 fL (ref 80.0–100.0)
Monocytes Absolute: 0.4 10*3/uL (ref 0.1–1.0)
Monocytes Relative: 10 %
Neutro Abs: 2.9 10*3/uL (ref 1.7–7.7)
Neutrophils Relative %: 69 %
Platelets: 72 10*3/uL — ABNORMAL LOW (ref 150–400)
RBC: 2.89 MIL/uL — ABNORMAL LOW (ref 3.87–5.11)
RDW: 14.6 % (ref 11.5–15.5)
Smear Review: NORMAL
WBC: 4.1 10*3/uL (ref 4.0–10.5)
nRBC: 0.5 % — ABNORMAL HIGH (ref 0.0–0.2)

## 2022-10-04 LAB — IRON AND TIBC
Iron: 98 ug/dL (ref 28–170)
Saturation Ratios: 29 % (ref 10.4–31.8)
TIBC: 335 ug/dL (ref 250–450)
UIBC: 237 ug/dL

## 2022-10-04 LAB — MAGNESIUM: Magnesium: 1.6 mg/dL — ABNORMAL LOW (ref 1.7–2.4)

## 2022-10-04 LAB — FERRITIN: Ferritin: 187 ng/mL (ref 11–307)

## 2022-10-04 MED ORDER — ONDANSETRON HCL 4 MG/2ML IJ SOLN
4.0000 mg | Freq: Once | INTRAMUSCULAR | Status: AC
  Administered 2022-10-04: 4 mg via INTRAVENOUS
  Filled 2022-10-04: qty 2

## 2022-10-04 MED ORDER — SUCRALFATE 1 G PO TABS: 1.0000 g | ORAL_TABLET | Freq: Three times a day (TID) | ORAL | 0 refills | Status: DC

## 2022-10-04 MED ORDER — SODIUM CHLORIDE 0.9 % IV SOLN
INTRAVENOUS | Status: DC
  Filled 2022-10-04 (×2): qty 250

## 2022-10-04 MED ORDER — HEPARIN SOD (PORK) LOCK FLUSH 100 UNIT/ML IV SOLN
500.0000 [IU] | Freq: Once | INTRAVENOUS | Status: DC
  Filled 2022-10-04: qty 5

## 2022-10-04 MED ORDER — HEPARIN SOD (PORK) LOCK FLUSH 100 UNIT/ML IV SOLN
INTRAVENOUS | Status: AC
  Administered 2022-10-04: 500 [IU]
  Filled 2022-10-04: qty 5

## 2022-10-04 MED ORDER — SODIUM CHLORIDE 0.9% FLUSH
10.0000 mL | Freq: Once | INTRAVENOUS | Status: AC
  Administered 2022-10-04: 10 mL via INTRAVENOUS
  Filled 2022-10-04: qty 10

## 2022-10-04 MED ORDER — MAGNESIUM SULFATE 2 GM/50ML IV SOLN
2.0000 g | Freq: Once | INTRAVENOUS | Status: AC
  Administered 2022-10-04: 2 g via INTRAVENOUS
  Filled 2022-10-04: qty 50

## 2022-10-04 NOTE — Telephone Encounter (Signed)
Patient has been scheduled for [facility].

## 2022-10-04 NOTE — Telephone Encounter (Signed)
Patient called stating that she is feeling poorly after her injection post chemotherapy. She states she can hardly walk due to knee pain, has generalized body aches as well and her left muscle hurts. She states that she is having a burning gnawing pain in her stomach (feels like she has an ulcer) and she does not want to eat or drink ( only had 1 bottles of water yesterday). She endorses bloating and gas.She does not have a GI doctor as she has "not needed one".She has had nausea and is using her antiemetics with control of nausea. She also reports that she had a very dark stool verging on being black over weekend.She states that she is tired and has a headache. Please advise

## 2022-10-04 NOTE — Progress Notes (Signed)
Symptom Management [place] at [facility] [phone] [phone]  Patient Care Team: Steele Sizer, MD as PCP - General (Family Medicine) Bary Castilla, Forest Gleason, MD (General Surgery) Carloyn Manner, MD as Referring Physician (Otolaryngology) Yolonda Kida, MD as Consulting Physician (Cardiology) Ree Edman, MD as Referring Physician (Dermatology) Lonia Farber, MD as Consulting Physician (Internal Medicine) Cammie Sickle, MD as Consulting Physician (Internal Medicine)   Name of the patient: Morgan Peterson  [id number]  01-08-1951   Date of visit: 10/04/22  Reason for Consult: Morgan Peterson is a 71 y.o. 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 Who presents today for:   [place] stool: Patient reports that overall she has not felt well since her last injections post chemotherapy [name] on 09/22/2022. She is having body aches, burning sensation in her abdomen, anorexia, bloating/flatulence. Body aches and pains have improved but she is mostly concerned about her gnawing abdominal pain and dark stools. Dark stool occurred on Friday and the second dark stool (which was less dark) was today. Has normal looking stool inbetween. She denies any nausea (except for mild this morning), vomiting, constipation, diarrhea, urinary symptoms. Of note she has been off of her Protonix for the past 4-6 weeks as she felt that this might have been increasing her gastric juices as she felt heard more gastric sounds during this time. She has tried pepcid 20 mg BID in the meantime along with 2 doses of mylanta and a few doses of tums.    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 Arimidex therapy completed September 2015   Mild mitral valve prolapse    Personal history of malignant neoplasm of breast 2010   Personal history of radiation therapy 2010   mammosite   T2DM (type 2 diabetes mellitus) ([place]) 2011    PAST SURGICAL HISTORY:  Past Surgical History:  Procedure Laterality Date   ABDOMINAL EXPLORATION SURGERY  2000   ovarian cyst   BREAST BIOPSY Right 2010   +    BREAST EXCISIONAL BIOPSY Right 2010   + mammo site inasive mammo ca   BREAST LUMPECTOMY Right 2010   [facility]   COLONOSCOPY  07/19/2012   Normal exam, Dr. Bary Castilla   COLONOSCOPY WITH PROPOFOL N/A 04/21/2022   Procedure: COLONOSCOPY WITH PROPOFOL;  Surgeon: Robert Bellow, MD;  Location: [facility] ENDOSCOPY;  Service: Endoscopy;  Laterality: N/A;   MASTECTOMY Right 2010   partial/ lumpectomy   PORTACATH PLACEMENT Left 05/19/2022   Procedure: INSERTION PORT-A-CATH;  Surgeon: Robert Bellow, MD;  Location: [facility] ORS;  Service: General;  Laterality: Left;   SIMPLE MASTECTOMY WITH AXILLARY SENTINEL NODE BIOPSY Right 04/26/2022   Procedure: SIMPLE MASTECTOMY WITH AXILLARY SENTINEL NODE BIOPSY;  Surgeon: Robert Bellow, MD;  Location: [facility] ORS;  Service: General;  Laterality: Right;  RNFA to assist   TUBAL LIGATION  20 years ago    HEMATOLOGY/ONCOLOGY HISTORY:  Oncology History Overview Note  She has a remote history of right breast cancer, T1bN0, ER positive,s/p lumpectomy and MammoSite radiation and 5 years of Arimidex.    02/19/22 screening mammogram bilaterally showed  1. Suspicious right breast mass at the 10 o'clock position 8 cm from the nipple. Recommend ultrasound-guided  biopsy. 2. Indeterminate right breast mass at the 6 o'clock position 4 cm from the nipple. Recommend ultrasound-guided biopsy. 3. No suspicious right axillary lymphadenopathy.   04/12/2022 Unilateral right breast diagnostic mammogram showed 1. Suspicious right breast mass at the 10 o'clock position 8 cm from the nipple. Recommend ultrasound-guided biopsy. 2. Indeterminate right breast mass at the 6 o'clock position 4 cm from the nipple. Recommend ultrasound-guided biopsy. 3. No suspicious right axillary lymphadenopathy.   04/13/2022 right breast mass 6 o'clock position 1 cm from the nipple showed invasive mammary carcinoma, no special type. Grade 3, DCIS present high grade, LVI not identified. ER-, PR 1-10%, HER 2 -   04/13/2022, right breast mass 10:00 7 cm from nipple biopsy showed invasive mammary carcinoma, grade 3, high-grade DCIS with focal comedonecrosis, lymphovascular invasion not identified.  ER -, PR 1 to 10%, HER2-    04/26/2022, right breast mastectomy with axillary dissection Invasive mammary carcinoma, no special type, multifocal, DCIS high-grade, benign nipple/areola.  2 deposits of invasive mammary carcinoma, no definite residual lymph node identified.   Breast cancer ([place])  05/10/2022 Initial Diagnosis   Breast cancer ([place])   05/10/2022 Cancer Staging   Staging form: Breast, AJCC 8th Edition - Pathologic stage from 05/10/2022: Stage IIA (pT1c, pN1a, cM0, G3, ER-, PR+, HER2-) - Signed by Earlie Server, MD on 05/10/2022 Stage prefix: Initial diagnosis Histologic grading system: 3 grade system   05/24/2022 - 05/24/2022 Chemotherapy   Patient is on Treatment Plan : BREAST Pembrolizumab (200) D1 + Carboplatin (5) D1 + Paclitaxel (80) D1,8,15 q21d X 4 cycles / Pembrolizumab (200) D1 + AC D1 q21d x 4 cycles     05/27/2022 - 08/13/2022 Chemotherapy   Patient is on Treatment Plan : BREAST Pembrolizumab (200) D1 + Carboplatin (5) D1 + Paclitaxel (80) D1,8,15 q21d X 4 cycles / Pembrolizumab (200) D1 + AC  D1 q21d x 4 cycles     05/27/2022 -  Chemotherapy   Patient is on Treatment Plan : BREAST Pembrolizumab (200) D1 + Carboplatin (5) D1 + Paclitaxel (80) D1,8,15 q21d X 4 cycles / Pembrolizumab (200) D1 + AC D1 q21d x 4 cycles     Carcinoma of upper-outer quadrant of right breast in female, estrogen receptor negative ([place])  05/26/2022 Initial Diagnosis   Carcinoma of upper-outer quadrant of right breast in female, estrogen receptor negative ([place])   05/26/2022 Cancer Staging   Staging form: Breast, AJCC 8th Edition - Pathologic: Stage IIA (pT1c, pN1, cM0, G3, ER-, PR-, HER2-) - Signed by Cammie Sickle, MD on 05/26/2022 Multigene prognostic tests performed: None Histologic grading system: 3 grade system   05/27/2022 -  Chemotherapy   Patient is on Treatment Plan : BREAST Pembrolizumab (200) D1 + Carboplatin (5) D1 + Paclitaxel (80) D1,8,15 q21d X 4 cycles / Pembrolizumab (200) D1 + AC D1 q21d x 4 cycles       ALLERGIES:  is allergic to levaquin [levofloxacin in d5w], losartan, metoprolol, saxagliptin, amlodipine-olmesartan, canagliflozin, egg white [egg white (egg protein)], gabapentin, glipizide, gluten meal, lisinopril, metformin and related, milk (cow), milk-related compounds, shrimp extract allergy skin test, tramadol, zyprexa [olanzapine], actos [pioglitazone], atorvastatin, carvedilol, and prednisone.  MEDICATIONS:  Current Outpatient Medications  Medication Sig Dispense Refill   atenolol (TENORMIN) 25 MG tablet Take 1 tablet (25 mg total) by mouth daily. 90 tablet 0   azelastine (ASTELIN) 0.1 % nasal spray Place 2 sprays into both nostrils daily as needed for allergies or rhinitis. Use in each nostril as directed     Blood Glucose Monitoring Suppl (ONETOUCH VERIO) w/Device KIT 1 Device by Does not apply route once. 1 kit 0   cholecalciferol (VITAMIN D) 1000 UNITS tablet Take 1,000 Units by mouth daily.     ciprofloxacin (CIPRO) 500 MG tablet Take 1 tablet (500 mg total) by mouth 2 (two)  times daily. 14 tablet 0   Cyanocobalamin (VITAMIN B-12 PO) Take 1 tablet by mouth daily as needed (fatigue).     famotidine (PEPCID) 20 MG tablet Take 20 mg by mouth 2 (two) times daily.     glucose blood (ONETOUCH VERIO) test strip Use as instructed 100 each 12   Lancets (ONETOUCH ULTRASOFT) lancets Use as instructed 100 each 3   lidocaine-prilocaine (EMLA) cream Apply 1 application. topically as needed. Apply to port and cover with saran wrap 1-2 hours prior to port access 30 g 1   magnesium oxide (MAG-OX) 400 (240 Mg) MG tablet Take 1 tablet (400 mg total) by mouth daily. 15 tablet 0   Multiple Vitamin (MULTIVITAMIN WITH MINERALS) TABS tablet Take 1 tablet by mouth 2 (two) times a week.     ondansetron (ZOFRAN) 8 MG tablet Take 1 tablet (8 mg total) by mouth every 8 (eight) hours as needed for nausea or vomiting. 40 tablet 1   OZEMPIC, 0.25 OR 0.5 MG/DOSE, 2 MG/1.5ML SOPN Inject 0.5 mg into the skin every Sunday.     pantoprazole (PROTONIX) 20 MG tablet Take 1 tablet (20 mg total) by mouth daily. 30 tablet 1   promethazine (PHENERGAN) 25 MG tablet Take 1 tablet (25 mg total) by mouth every 6 (six) hours as needed for nausea or vomiting. 40 tablet 0   rosuvastatin (CRESTOR) 10 MG tablet Take 1 tablet (10 mg total) by mouth every morning. 90 tablet 1   spironolactone (ALDACTONE) 50 MG tablet Take 1 tablet (50 mg total) by mouth daily. 90 tablet 0   sucralfate (CARAFATE) 1 g tablet Take 1 tablet (1 g total) by mouth 4 (four) times daily -  with meals and at bedtime for 14 days. 56 tablet 0   aspirin EC 81 MG tablet Take 1 tablet (81 mg total) by mouth daily. (Patient not taking: Reported on 10/04/2022) 30 tablet 0   betamethasone, augmented, (DIPROLENE) 0.05 % lotion Apply 1 application topically 2 (two) times daily. (Patient not taking: Reported on 10/04/2022)     dronabinol (MARINOL) 2.5 MG capsule Take 1 capsule (2.5 mg total) by mouth daily before lunch. (Patient not taking: Reported on  09/28/2022) 60 capsule 0   EPINEPHrine (EPIPEN 2-PAK) 0.3 mg/0.3 mL IJ SOAJ injection Inject 0.3 mg into the muscle as needed for anaphylaxis. (Patient not taking: Reported on 09/03/2022) 2 each 0   levocetirizine (XYZAL) 5 MG tablet Take 1 tablet (5 mg total) by mouth every evening. (Patient not taking: Reported on 10/04/2022) 90 tablet 1   montelukast (SINGULAIR) 10 MG tablet Take 1 tablet (10 mg total) by mouth at bedtime. (Patient not taking: Reported on 10/04/2022) 90 tablet 1   rizatriptan (MAXALT-MLT) 10 MG disintegrating tablet Take 5-10 mg by mouth as needed for migraine. May repeat in 2 hours if needed (Patient not taking: Reported on 10/04/2022)  No current facility-administered medications for this visit.   Facility-Administered Medications Ordered in Other Visits  Medication Dose Route Frequency Provider Last Rate Last Admin   0.9 %  sodium chloride infusion   Intravenous Continuous Hughie Closs, PA-C   Stopped at 10/04/22 1212   heparin lock flush 100 unit/mL  500 Units Intracatheter Once Scarlett Portlock M, PA-C        VITAL SIGNS: BP 112/65 (BP Location: Left Arm, Patient Position: Sitting, Cuff Size: Large)   Pulse 92   Temp 98 F (36.7 C) (Tympanic)   Resp 20   Ht _0  (1.676 m)   Wt 187 lb 3.2 oz (84.9 kg)   SpO2 100% Comment: room air  BMI 30.21 kg/m  Filed Weights   10/04/22 1000  Weight: 187 lb 3.2 oz (84.9 kg)    Estimated body mass index is 30.21 kg/m as calculated from the following:   Height as of this encounter: _1  (1.676 m).   Weight as of this encounter: 187 lb 3.2 oz (84.9 kg).  LABS: CBC:    Component Value Date/Time   WBC 4.1 10/04/2022 1025   HGB 8.9 (L) 10/04/2022 1025   HGB 12.4 03/24/2015 1348   HCT 26.9 (L) 10/04/2022 1025   HCT 38.6 03/24/2015 1348   PLT 72 (L) 10/04/2022 1025   PLT 250 03/24/2015 1348   MCV 93.1 10/04/2022 1025   MCV 86 03/24/2015 1348   NEUTROABS 2.9 10/04/2022 1025   NEUTROABS 3.8 03/24/2015 1348    LYMPHSABS 0.5 (L) 10/04/2022 1025   LYMPHSABS 2.0 03/24/2015 1348   MONOABS 0.4 10/04/2022 1025   MONOABS 0.3 03/24/2015 1348   EOSABS 0.0 10/04/2022 1025   EOSABS 0.2 03/24/2015 1348   BASOSABS 0.1 10/04/2022 1025   BASOSABS 0.0 03/24/2015 1348   Comprehensive Metabolic Panel:    Component Value Date/Time   NA 137 10/04/2022 1025   NA 141 06/26/2019 0000   NA 137 03/24/2015 1348   K 3.7 10/04/2022 1025   K 3.5 03/24/2015 1348   CL 106 10/04/2022 1025   CL 101 03/24/2015 1348   CO2 23 10/04/2022 1025   CO2 31 03/24/2015 1348   BUN 11 10/04/2022 1025   BUN 9 06/26/2019 0000   BUN 11 03/24/2015 1348   CREATININE 0.93 10/04/2022 1025   CREATININE 0.81 12/31/2021 1005   GLUCOSE 180 (H) 10/04/2022 1025   GLUCOSE 162 (H) 03/24/2015 1348   CALCIUM 8.6 (L) 10/04/2022 1025   CALCIUM 8.7 (L) 03/24/2015 1348   AST 25 10/04/2022 1025   AST 19 03/24/2015 1348   ALT 18 10/04/2022 1025   ALT 16 03/24/2015 1348   ALKPHOS 75 10/04/2022 1025   ALKPHOS 112 03/24/2015 1348   BILITOT 0.5 10/04/2022 1025   BILITOT 0.4 09/13/2017 1025   BILITOT 0.3 03/24/2015 1348   PROT 6.7 10/04/2022 1025   PROT 7.0 09/13/2017 1025   PROT 7.2 03/24/2015 1348   ALBUMIN 3.4 (L) 10/04/2022 1025   ALBUMIN 3.9 09/13/2017 1025   ALBUMIN 3.6 03/24/2015 1348    RADIOGRAPHIC STUDIES: No results found.  PERFORMANCE STATUS (ECOG) : 1 - Symptomatic but completely ambulatory  Review of Systems Unless otherwise noted, a complete review of systems is negative.  Physical Exam General: NAD HEENT: conjunctiva without pallor  Cardiovascular: regular rate and rhythm Pulmonary: clear ant fields Abdomen: soft, nontender, + bowel sounds GU: no suprapubic tenderness Extremities: no edema, no joint deformities Skin: no rashes Neurological: Weakness but otherwise nonfocal  Assessment and Plan- Patient is a 71 y.o. female    Encounter Diagnoses  Name Primary?   Symptom of blood in stool Yes   Carcinoma of  upper-outer quadrant of right breast in female, estrogen receptor negative ([facility])    Hypomagnesemia    Nausea without vomiting    Dehydration    Symptoms of stool in blood: New. Has stopped her 81 mg asa at this time. No NSAIDs. Unfortunately we do not have occult testing available. Her hgb dropped from 9.6 to 8.9 within 3 days. Suggestive of mild GI bleed. Ulcer suspected. H. Pylori testing. Restarting her on her PPI (Protonix which she has at home), starting her on sucralfate and a bland GERD diet, oral hydration encouraged. Referral to GI. RTC Wednesday for lab and symptoms monitoring. Discussed red flag signs and symptoms.   Carcinoma of UOQ of R Breast ER -: Chronic in nature. Labs reviewed. Weekly Taxol currently on adjuvant chemotherapy with Adriamycin, Cytoxan, and Keytruda. At this time no change of plan suggested however we will closely monitor symptoms. She states that she wishes to discuss the Udenyca with Dr. Rogue Bussing at her next visit as she did not enjoy the aches and pains it caused her following use.   Hypomagnesemia: Acute on chronic. Likely secondary to GI distress. IV magnesium 2 g administered in office.   Nausea: New as of this morning. Likely secondary to GI distress. Zofran effective in office. Has supportive therapy at home. Discussed red flags.     Patient expressed understanding and was in agreement with this plan. She also understands that She can call clinic at any time with any questions, concerns, or complaints.   Thank you for allowing me to participate in the care of this very pleasant patient.   Time Total: 25  Visit consisted of counseling and education dealing with the complex and emotionally intense issues of symptom management in the setting of serious illness.Greater than 50%  of this time was spent counseling and coordinating care related to the above assessment and plan.  Signed by: Nelwyn Salisbury, PA-C

## 2022-10-04 NOTE — Telephone Encounter (Signed)
Patient contacted- apt given for smc at 10 am

## 2022-10-06 ENCOUNTER — Inpatient Hospital Stay (HOSPITAL_BASED_OUTPATIENT_CLINIC_OR_DEPARTMENT_OTHER): Payer: Medicare Other | Admitting: Medical Oncology

## 2022-10-06 ENCOUNTER — Inpatient Hospital Stay: Payer: Medicare Other

## 2022-10-06 ENCOUNTER — Other Ambulatory Visit: Payer: Self-pay | Admitting: *Deleted

## 2022-10-06 ENCOUNTER — Encounter: Payer: Self-pay | Admitting: Medical Oncology

## 2022-10-06 VITALS — BP 118/81 | HR 87 | Temp 98.5°F | Resp 17

## 2022-10-06 DIAGNOSIS — C50411 Malignant neoplasm of upper-outer quadrant of right female breast: Secondary | ICD-10-CM

## 2022-10-06 DIAGNOSIS — Z171 Estrogen receptor negative status [ER-]: Secondary | ICD-10-CM

## 2022-10-06 DIAGNOSIS — K921 Melena: Secondary | ICD-10-CM

## 2022-10-06 DIAGNOSIS — C50811 Malignant neoplasm of overlapping sites of right female breast: Secondary | ICD-10-CM

## 2022-10-06 DIAGNOSIS — R110 Nausea: Secondary | ICD-10-CM | POA: Diagnosis not present

## 2022-10-06 DIAGNOSIS — Z5112 Encounter for antineoplastic immunotherapy: Secondary | ICD-10-CM | POA: Diagnosis not present

## 2022-10-06 DIAGNOSIS — Z95828 Presence of other vascular implants and grafts: Secondary | ICD-10-CM

## 2022-10-06 DIAGNOSIS — R1013 Epigastric pain: Secondary | ICD-10-CM

## 2022-10-06 DIAGNOSIS — D649 Anemia, unspecified: Secondary | ICD-10-CM

## 2022-10-06 LAB — CBC WITH DIFFERENTIAL/PLATELET
Abs Immature Granulocytes: 0.29 10*3/uL — ABNORMAL HIGH (ref 0.00–0.07)
Basophils Absolute: 0 10*3/uL (ref 0.0–0.1)
Basophils Relative: 1 %
Eosinophils Absolute: 0 10*3/uL (ref 0.0–0.5)
Eosinophils Relative: 0 %
HCT: 27.2 % — ABNORMAL LOW (ref 36.0–46.0)
Hemoglobin: 8.4 g/dL — ABNORMAL LOW (ref 12.0–15.0)
Immature Granulocytes: 6 %
Lymphocytes Relative: 10 %
Lymphs Abs: 0.5 10*3/uL — ABNORMAL LOW (ref 0.7–4.0)
MCH: 29.6 pg (ref 26.0–34.0)
MCHC: 30.9 g/dL (ref 30.0–36.0)
MCV: 95.8 fL (ref 80.0–100.0)
Monocytes Absolute: 0.6 10*3/uL (ref 0.1–1.0)
Monocytes Relative: 11 %
Neutro Abs: 3.7 10*3/uL (ref 1.7–7.7)
Neutrophils Relative %: 72 %
Platelets: 104 10*3/uL — ABNORMAL LOW (ref 150–400)
RBC: 2.84 MIL/uL — ABNORMAL LOW (ref 3.87–5.11)
RDW: 14.8 % (ref 11.5–15.5)
Smear Review: NORMAL
WBC: 5.1 10*3/uL (ref 4.0–10.5)
nRBC: 1 % — ABNORMAL HIGH (ref 0.0–0.2)

## 2022-10-06 LAB — COMPREHENSIVE METABOLIC PANEL
ALT: 15 U/L (ref 0–44)
AST: 20 U/L (ref 15–41)
Albumin: 3.4 g/dL — ABNORMAL LOW (ref 3.5–5.0)
Alkaline Phosphatase: 73 U/L (ref 38–126)
Anion gap: 6 (ref 5–15)
BUN: 11 mg/dL (ref 8–23)
CO2: 24 mmol/L (ref 22–32)
Calcium: 8.8 mg/dL — ABNORMAL LOW (ref 8.9–10.3)
Chloride: 110 mmol/L (ref 98–111)
Creatinine, Ser: 0.76 mg/dL (ref 0.44–1.00)
GFR, Estimated: >60 mL/min (ref >60)
Glucose, Bld: 120 mg/dL — ABNORMAL HIGH (ref 70–99)
Potassium: 3.8 mmol/L (ref 3.5–5.1)
Sodium: 140 mmol/L (ref 135–145)
Total Bilirubin: 0.3 mg/dL (ref 0.3–1.2)
Total Protein: 6.7 g/dL (ref 6.5–8.1)

## 2022-10-06 LAB — SAMPLE TO BLOOD BANK

## 2022-10-06 LAB — MAGNESIUM: Magnesium: 1.8 mg/dL (ref 1.7–2.4)

## 2022-10-06 MED ORDER — PANTOPRAZOLE SODIUM 40 MG PO TBEC: 40.0000 mg | DELAYED_RELEASE_TABLET | Freq: Every day | ORAL | 0 refills | Status: DC

## 2022-10-06 MED ORDER — SODIUM CHLORIDE 0.9% FLUSH
10.0000 mL | Freq: Once | INTRAVENOUS | Status: AC
  Administered 2022-10-06: 10 mL via INTRAVENOUS
  Filled 2022-10-06: qty 10

## 2022-10-06 MED ORDER — HEPARIN SOD (PORK) LOCK FLUSH 100 UNIT/ML IV SOLN
500.0000 [IU] | Freq: Once | INTRAVENOUS | Status: AC
  Administered 2022-10-06: 500 [IU]
  Filled 2022-10-06: qty 5

## 2022-10-06 NOTE — Progress Notes (Signed)
Symptom Management [place] at [facility] [phone] [phone]  Patient Care Team: Steele Sizer, MD as PCP - General (Family Medicine) Bary Castilla, Forest Gleason, MD (General Surgery) Carloyn Manner, MD as Referring Physician (Otolaryngology) Yolonda Kida, MD as Consulting Physician (Cardiology) Ree Edman, MD as Referring Physician (Dermatology) Lonia Farber, MD as Consulting Physician (Internal Medicine) Cammie Sickle, MD as Consulting Physician (Internal Medicine)   Name of the patient: Morgan Peterson  [id number]  1951-09-04   Date of visit: 10/06/22  Reason for Consult: Morgan Peterson is a 71 y.o. 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 Started shortly after last chemotherapy- pt thinks it was secondary to [place]. She was started on sucralfate, protonix 20 mg and her 81 mg asa was held given decrease in hemoglobin levels and symptoms. She was also recommended to follow a bland diet. Today she reports that she is feeling much better. She has not had any bowel movements since our last visit but states that this is not uncommon for her. She has had resolution of her abdominal pain and almost full resolution of her nausea and did not have any episodes of vomiting. She is taking the medications we gave her as directed. She is now able to tolerate fluids and foods better.    Wt Readings from Last 3 Encounters:  10/04/22 187 lb 3.2 oz (84.9 kg)  10/01/22 185 lb 1.6 oz (84 kg)  09/21/22 193 lb 9.6 oz (87.8 kg)     PAST MEDICAL HISTORY: Past Medical History:  Diagnosis Date   Appetite loss    Cervical  radiculopathy    Chemotherapy-induced nausea    Hiatal hernia    Hypercholesterolemia    Hyperlipidemia    Hypertension    IBS (irritable bowel syndrome)    Malignant neoplasm of upper-outer quadrant of female breast ([place]) 04/11/2009   Right breast, invasive ductal carcinoma, 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 Arimidex therapy completed September 2015   Mild mitral valve prolapse    Personal history of malignant neoplasm of breast 2010   Personal history of radiation therapy 2010   mammosite   T2DM (type 2 diabetes mellitus) ([place]) 2011    PAST SURGICAL HISTORY:  Past Surgical History:  Procedure Laterality Date   ABDOMINAL EXPLORATION SURGERY  2000   ovarian cyst   BREAST BIOPSY Right 2010   +    BREAST EXCISIONAL BIOPSY Right 2010   + mammo site inasive mammo ca   BREAST LUMPECTOMY Right 2010   [facility]   COLONOSCOPY  07/19/2012   Normal exam, Dr. Bary Castilla   COLONOSCOPY WITH PROPOFOL N/A 04/21/2022   Procedure: COLONOSCOPY WITH PROPOFOL;  Surgeon: Robert Bellow, MD;  Location: [facility] ENDOSCOPY;  Service: Endoscopy;  Laterality: N/A;   MASTECTOMY Right 2010   partial/ lumpectomy   PORTACATH PLACEMENT Left 05/19/2022   Procedure: INSERTION PORT-A-CATH;  Surgeon: Robert Bellow, MD;  Location: [facility] ORS;  Service: General;  Laterality: Left;   SIMPLE MASTECTOMY WITH AXILLARY SENTINEL NODE BIOPSY Right 04/26/2022   Procedure: SIMPLE MASTECTOMY WITH AXILLARY SENTINEL NODE BIOPSY;  Surgeon: Robert Bellow, MD;  Location: [facility] ORS;  Service: General;  Laterality: Right;  RNFA to assist   TUBAL LIGATION  20 years ago    HEMATOLOGY/ONCOLOGY HISTORY:  Oncology History Overview Note  She has a remote history of right breast cancer, T1bN0, ER positive,s/p lumpectomy and MammoSite radiation and 5 years of Arimidex.    02/19/22 screening mammogram bilaterally showed  1. Suspicious right breast mass at the 10 o'clock position 8 cm  from the nipple. Recommend ultrasound-guided biopsy. 2. Indeterminate right breast mass at the 6 o'clock position 4 cm from the nipple. Recommend ultrasound-guided biopsy. 3. No suspicious right axillary lymphadenopathy.   04/12/2022 Unilateral right breast diagnostic mammogram showed 1. Suspicious right breast mass at the 10 o'clock position 8 cm from the nipple. Recommend ultrasound-guided biopsy. 2. Indeterminate right breast mass at the 6 o'clock position 4 cm from the nipple. Recommend ultrasound-guided biopsy. 3. No suspicious right axillary lymphadenopathy.   04/13/2022 right breast mass 6 o'clock position 1 cm from the nipple showed invasive mammary carcinoma, no special type. Grade 3, DCIS present high grade, LVI not identified. ER-, PR 1-10%, HER 2 -   04/13/2022, right breast mass 10:00 7 cm from nipple biopsy showed invasive mammary carcinoma, grade 3, high-grade DCIS with focal comedonecrosis, lymphovascular invasion not identified.  ER -, PR 1 to 10%, HER2-    04/26/2022, right breast mastectomy with axillary dissection Invasive mammary carcinoma, no special type, multifocal, DCIS high-grade, benign nipple/areola.  2 deposits of invasive mammary carcinoma, no definite residual lymph node identified.   Breast cancer ([place])  05/10/2022 Initial Diagnosis   Breast cancer ([place])   05/10/2022 Cancer Staging   Staging form: Breast, AJCC 8th Edition - Pathologic stage from 05/10/2022: Stage IIA (pT1c, pN1a, cM0, G3, ER-, PR+, HER2-) - Signed by Earlie Server, MD on 05/10/2022 Stage prefix: Initial diagnosis Histologic grading system: 3 grade system   05/24/2022 - 05/24/2022 Chemotherapy   Patient is on Treatment Plan : BREAST Pembrolizumab (200) D1 + Carboplatin (5) D1 + Paclitaxel (80) D1,8,15 q21d X 4 cycles / Pembrolizumab (200) D1 + AC D1 q21d x 4 cycles     05/27/2022 - 08/13/2022 Chemotherapy   Patient is on Treatment Plan : BREAST Pembrolizumab (200) D1 + Carboplatin (5) D1 + Paclitaxel (80) D1,8,15  q21d X 4 cycles / Pembrolizumab (200) D1 + AC D1 q21d x 4 cycles     05/27/2022 -  Chemotherapy   Patient is on Treatment Plan : BREAST Pembrolizumab (200) D1 + Carboplatin (5) D1 + Paclitaxel (80) D1,8,15 q21d X 4 cycles / Pembrolizumab (200) D1 + AC D1 q21d x 4 cycles     Carcinoma of upper-outer quadrant of right breast in female, estrogen receptor negative ([place])  05/26/2022 Initial Diagnosis   Carcinoma of upper-outer quadrant of right breast in female, estrogen receptor negative ([place])   05/26/2022 Cancer Staging   Staging form: Breast, AJCC 8th Edition - Pathologic: Stage IIA (pT1c, pN1, cM0, G3, ER-, PR-, HER2-) - Signed by Cammie Sickle, MD on 05/26/2022 Multigene prognostic tests performed: None Histologic grading system: 3 grade system   05/27/2022 -  Chemotherapy   Patient is on Treatment Plan : BREAST Pembrolizumab (200) D1 + Carboplatin (5) D1 + Paclitaxel (80) D1,8,15 q21d X 4 cycles / Pembrolizumab (200) D1 + AC D1 q21d x 4 cycles       ALLERGIES:  is allergic to levaquin [levofloxacin in d5w], losartan, metoprolol, saxagliptin, amlodipine-olmesartan, canagliflozin, egg white [egg white (egg protein)], gabapentin, glipizide, gluten meal, lisinopril, metformin and related, milk (  cow), milk-related compounds, shrimp extract allergy skin test, tramadol, zyprexa [olanzapine], actos [pioglitazone], atorvastatin, carvedilol, and prednisone.  MEDICATIONS:  Current Outpatient Medications  Medication Sig Dispense Refill   atenolol (TENORMIN) 25 MG tablet Take 1 tablet (25 mg total) by mouth daily. 90 tablet 0   azelastine (ASTELIN) 0.1 % nasal spray Place 2 sprays into both nostrils daily as needed for allergies or rhinitis. Use in each nostril as directed     Blood Glucose Monitoring Suppl (ONETOUCH VERIO) w/Device KIT 1 Device by Does not apply route once. 1 kit 0   cholecalciferol (VITAMIN D) 1000 UNITS tablet Take 1,000 Units by mouth daily.     ciprofloxacin (CIPRO) 500 MG tablet  Take 1 tablet (500 mg total) by mouth 2 (two) times daily. 14 tablet 0   Cyanocobalamin (VITAMIN B-12 PO) Take 1 tablet by mouth daily as needed (fatigue).     famotidine (PEPCID) 20 MG tablet Take 20 mg by mouth 2 (two) times daily.     glucose blood (ONETOUCH VERIO) test strip Use as instructed 100 each 12   Lancets (ONETOUCH ULTRASOFT) lancets Use as instructed 100 each 3   lidocaine-prilocaine (EMLA) cream Apply 1 application. topically as needed. Apply to port and cover with saran wrap 1-2 hours prior to port access 30 g 1   magnesium oxide (MAG-OX) 400 (240 Mg) MG tablet Take 1 tablet (400 mg total) by mouth daily. 15 tablet 0   Multiple Vitamin (MULTIVITAMIN WITH MINERALS) TABS tablet Take 1 tablet by mouth 2 (two) times a week.     ondansetron (ZOFRAN) 8 MG tablet Take 1 tablet (8 mg total) by mouth every 8 (eight) hours as needed for nausea or vomiting. 40 tablet 1   OZEMPIC, 0.25 OR 0.5 MG/DOSE, 2 MG/1.5ML SOPN Inject 0.5 mg into the skin every Sunday.     pantoprazole (PROTONIX) 40 MG tablet Take 1 tablet (40 mg total) by mouth daily. 14 tablet 0   promethazine (PHENERGAN) 25 MG tablet Take 1 tablet (25 mg total) by mouth every 6 (six) hours as needed for nausea or vomiting. 40 tablet 0   rosuvastatin (CRESTOR) 10 MG tablet Take 1 tablet (10 mg total) by mouth every morning. 90 tablet 1   spironolactone (ALDACTONE) 50 MG tablet Take 1 tablet (50 mg total) by mouth daily. 90 tablet 0   sucralfate (CARAFATE) 1 g tablet Take 1 tablet (1 g total) by mouth 4 (four) times daily -  with meals and at bedtime for 14 days. 56 tablet 0   aspirin EC 81 MG tablet Take 1 tablet (81 mg total) by mouth daily. (Patient not taking: Reported on 10/04/2022) 30 tablet 0   betamethasone, augmented, (DIPROLENE) 0.05 % lotion Apply 1 application topically 2 (two) times daily. (Patient not taking: Reported on 10/04/2022)     dronabinol (MARINOL) 2.5 MG capsule Take 1 capsule (2.5 mg total) by mouth daily before  lunch. (Patient not taking: Reported on 09/28/2022) 60 capsule 0   EPINEPHrine (EPIPEN 2-PAK) 0.3 mg/0.3 mL IJ SOAJ injection Inject 0.3 mg into the muscle as needed for anaphylaxis. (Patient not taking: Reported on 09/03/2022) 2 each 0   levocetirizine (XYZAL) 5 MG tablet Take 1 tablet (5 mg total) by mouth every evening. (Patient not taking: Reported on 10/04/2022) 90 tablet 1   montelukast (SINGULAIR) 10 MG tablet Take 1 tablet (10 mg total) by mouth at bedtime. (Patient not taking: Reported on 10/04/2022) 90 tablet 1   rizatriptan (MAXALT-MLT) 10 MG disintegrating  tablet Take 5-10 mg by mouth as needed for migraine. May repeat in 2 hours if needed (Patient not taking: Reported on 10/04/2022)     No current facility-administered medications for this visit.    VITAL SIGNS: BP 118/81 (Patient Position: Sitting)   Pulse 87   Temp 98.5 F (36.9 C) (Tympanic)   Resp 17   SpO2 98%  There were no vitals filed for this visit.  Estimated body mass index is 30.21 kg/m as calculated from the following:   Height as of 10/04/22: _0  (1.676 m).   Weight as of 10/04/22: 187 lb 3.2 oz (84.9 kg).  LABS: CBC:    Component Value Date/Time   WBC 5.1 10/06/2022 0931   HGB 8.4 (L) 10/06/2022 0931   HGB 12.4 03/24/2015 1348   HCT 27.2 (L) 10/06/2022 0931   HCT 38.6 03/24/2015 1348   PLT 104 (L) 10/06/2022 0931   PLT 250 03/24/2015 1348   MCV 95.8 10/06/2022 0931   MCV 86 03/24/2015 1348   NEUTROABS 3.7 10/06/2022 0931   NEUTROABS 3.8 03/24/2015 1348   LYMPHSABS 0.5 (L) 10/06/2022 0931   LYMPHSABS 2.0 03/24/2015 1348   MONOABS 0.6 10/06/2022 0931   MONOABS 0.3 03/24/2015 1348   EOSABS 0.0 10/06/2022 0931   EOSABS 0.2 03/24/2015 1348   BASOSABS 0.0 10/06/2022 0931   BASOSABS 0.0 03/24/2015 1348   Comprehensive Metabolic Panel:    Component Value Date/Time   NA 140 10/06/2022 0931   NA 141 06/26/2019 0000   NA 137 03/24/2015 1348   K 3.8 10/06/2022 0931   K 3.5 03/24/2015 1348   CL  110 10/06/2022 0931   CL 101 03/24/2015 1348   CO2 24 10/06/2022 0931   CO2 31 03/24/2015 1348   BUN 11 10/06/2022 0931   BUN 9 06/26/2019 0000   BUN 11 03/24/2015 1348   CREATININE 0.76 10/06/2022 0931   CREATININE 0.81 12/31/2021 1005   GLUCOSE 120 (H) 10/06/2022 0931   GLUCOSE 162 (H) 03/24/2015 1348   CALCIUM 8.8 (L) 10/06/2022 0931   CALCIUM 8.7 (L) 03/24/2015 1348   AST 20 10/06/2022 0931   AST 19 03/24/2015 1348   ALT 15 10/06/2022 0931   ALT 16 03/24/2015 1348   ALKPHOS 73 10/06/2022 0931   ALKPHOS 112 03/24/2015 1348   BILITOT 0.3 10/06/2022 0931   BILITOT 0.4 09/13/2017 1025   BILITOT 0.3 03/24/2015 1348   PROT 6.7 10/06/2022 0931   PROT 7.0 09/13/2017 1025   PROT 7.2 03/24/2015 1348   ALBUMIN 3.4 (L) 10/06/2022 0931   ALBUMIN 3.9 09/13/2017 1025   ALBUMIN 3.6 03/24/2015 1348    RADIOGRAPHIC STUDIES: No results found.  PERFORMANCE STATUS (ECOG) : 1 - Symptomatic but completely ambulatory  Review of Systems Unless otherwise noted, a complete review of systems is negative.  Physical Exam General: NAD HEENT: Conjunctiva without pallor  Cardiovascular: regular rate and rhythm Pulmonary: clear ant fields Abdomen: soft, nontender, + bowel sounds GU: no suprapubic tenderness Extremities: no edema, no joint deformities Skin: no rashes Neurological: Weakness but otherwise nonfocal  Assessment and Plan- Patient is a 71 y.o. female    Encounter Diagnoses  Name Primary?   Malignant neoplasm of overlapping sites of right breast in female, estrogen receptor negative ([place]) Yes   Carcinoma of upper-outer quadrant of right breast in female, estrogen receptor negative ([place])    Symptom of blood in stool    Nausea without vomiting    Hypomagnesemia    1-2. Breast Cancer:  Chronic. Briefly discussed benefits of Udenyca. She will discuss additional Udenyca treatments further at her follow up visit on Monday with Dr. Rogue Bussing for consideration of cycle 7 day 1 of her  treatment.   3. Symptoms of blood in stool: Symptoms are improving. Hgb has dropped but may be slowing. Will need to continue to track and monitor closely. We will have her continue her bland diet, sucralfate and will increase her protonix to 40 mg. She will alert [place] to her next bowel movement including its coloration. Reviewed red flags. Follow up on Friday.  4. Nausea: Almost fully resolved. Continue zofran PRN. Protonix to be increased.   5. Hypomagnesemia: Resolved. We will continue to monitor.   Patient expressed understanding and was in agreement with this plan. She also understands that She can call clinic at any time with any questions, concerns, or complaints.   Thank you for allowing me to participate in the care of this very pleasant patient.   Time Total: 15  Visit consisted of counseling and education dealing with the complex and emotionally intense issues of symptom management in the setting of serious illness.Greater than 50%  of this time was spent counseling and coordinating care related to the above assessment and plan.  Signed by: Nelwyn Salisbury, PA-C

## 2022-10-06 NOTE — Progress Notes (Signed)
No fluids needed today.

## 2022-10-08 ENCOUNTER — Inpatient Hospital Stay (HOSPITAL_BASED_OUTPATIENT_CLINIC_OR_DEPARTMENT_OTHER): Payer: Medicare Other | Admitting: Hospice and Palliative Medicine

## 2022-10-08 ENCOUNTER — Inpatient Hospital Stay: Payer: Medicare Other

## 2022-10-08 DIAGNOSIS — K921 Melena: Secondary | ICD-10-CM

## 2022-10-08 DIAGNOSIS — R1013 Epigastric pain: Secondary | ICD-10-CM

## 2022-10-08 DIAGNOSIS — Z5112 Encounter for antineoplastic immunotherapy: Secondary | ICD-10-CM | POA: Diagnosis not present

## 2022-10-08 DIAGNOSIS — R110 Nausea: Secondary | ICD-10-CM

## 2022-10-08 DIAGNOSIS — D649 Anemia, unspecified: Secondary | ICD-10-CM

## 2022-10-08 DIAGNOSIS — Z171 Estrogen receptor negative status [ER-]: Secondary | ICD-10-CM

## 2022-10-08 LAB — CBC WITH DIFFERENTIAL/PLATELET
Abs Immature Granulocytes: 0.33 10*3/uL — ABNORMAL HIGH (ref 0.00–0.07)
Basophils Absolute: 0 10*3/uL (ref 0.0–0.1)
Basophils Relative: 1 %
Eosinophils Absolute: 0 10*3/uL (ref 0.0–0.5)
Eosinophils Relative: 0 %
HCT: 25.9 % — ABNORMAL LOW (ref 36.0–46.0)
Hemoglobin: 8.2 g/dL — ABNORMAL LOW (ref 12.0–15.0)
Immature Granulocytes: 5 %
Lymphocytes Relative: 10 %
Lymphs Abs: 0.6 10*3/uL — ABNORMAL LOW (ref 0.7–4.0)
MCH: 30.6 pg (ref 26.0–34.0)
MCHC: 31.7 g/dL (ref 30.0–36.0)
MCV: 96.6 fL (ref 80.0–100.0)
Monocytes Absolute: 0.6 10*3/uL (ref 0.1–1.0)
Monocytes Relative: 10 %
Neutro Abs: 5 10*3/uL (ref 1.7–7.7)
Neutrophils Relative %: 74 %
Platelets: 183 10*3/uL (ref 150–400)
RBC: 2.68 MIL/uL — ABNORMAL LOW (ref 3.87–5.11)
RDW: 15 % (ref 11.5–15.5)
WBC: 6.7 10*3/uL (ref 4.0–10.5)
nRBC: 0.6 % — ABNORMAL HIGH (ref 0.0–0.2)

## 2022-10-08 LAB — COMPREHENSIVE METABOLIC PANEL
ALT: 14 U/L (ref 0–44)
AST: 19 U/L (ref 15–41)
Albumin: 3.3 g/dL — ABNORMAL LOW (ref 3.5–5.0)
Alkaline Phosphatase: 68 U/L (ref 38–126)
Anion gap: 6 (ref 5–15)
BUN: 12 mg/dL (ref 8–23)
CO2: 25 mmol/L (ref 22–32)
Calcium: 8.5 mg/dL — ABNORMAL LOW (ref 8.9–10.3)
Chloride: 109 mmol/L (ref 98–111)
Creatinine, Ser: 0.77 mg/dL (ref 0.44–1.00)
GFR, Estimated: >60 mL/min (ref >60)
Glucose, Bld: 151 mg/dL — ABNORMAL HIGH (ref 70–99)
Potassium: 3.6 mmol/L (ref 3.5–5.1)
Sodium: 140 mmol/L (ref 135–145)
Total Bilirubin: 0.4 mg/dL (ref 0.3–1.2)
Total Protein: 6.5 g/dL (ref 6.5–8.1)

## 2022-10-08 LAB — OCCULT BLOOD X 1 CARD TO LAB, STOOL: Fecal Occult Bld: NEGATIVE

## 2022-10-08 LAB — SAMPLE TO BLOOD BANK

## 2022-10-08 MED ORDER — HEPARIN SOD (PORK) LOCK FLUSH 100 UNIT/ML IV SOLN
500.0000 [IU] | Freq: Once | INTRAVENOUS | Status: AC
  Administered 2022-10-08: 500 [IU] via INTRAVENOUS
  Filled 2022-10-08: qty 5

## 2022-10-08 MED ORDER — SODIUM CHLORIDE 0.9% FLUSH
10.0000 mL | Freq: Once | INTRAVENOUS | Status: AC
  Administered 2022-10-08: 10 mL via INTRAVENOUS
  Filled 2022-10-08: qty 10

## 2022-10-08 MED FILL — Fosaprepitant Dimeglumine For IV Infusion 150 MG (Base Eq): INTRAVENOUS | Qty: 5 | Status: AC

## 2022-10-08 NOTE — Progress Notes (Signed)
Mid epigastric pain has gotten much better. Taking carafate and proton pump inhibitors. Pt able to eat now.Pt hemoglobin has been dropping. Hold tube drawn today. Pt provided a stool sample.

## 2022-10-08 NOTE — Progress Notes (Signed)
Symptom Management [place] at [facility] [phone] [phone]  Patient Care Team: Steele Sizer, MD as PCP - General (Family Medicine) Bary Castilla, Forest Gleason, MD (General Surgery) Carloyn Manner, MD as Referring Physician (Otolaryngology) Yolonda Kida, MD as Consulting Physician (Cardiology) Ree Edman, MD as Referring Physician (Dermatology) Lonia Farber, MD as Consulting Physician (Internal Medicine) Cammie Sickle, MD as Consulting Physician (Internal Medicine)   NAME OF PATIENT: Morgan Peterson  [id number]  12/31/1950   DATE OF VISIT: 10/08/22  REASON FOR CONSULT: Morgan Peterson is a 71 y.o. 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    INTERVAL HISTORY: Chemotherapy had been on hold due to anemia and fatigue but this was resumed and patient received cycle 5 on 08/31/22 and cycle 6 on 09/21/2022.  Patient has been seen by Nelwyn Salisbury, PA-C on 10/16 and 10/18 with complaint of of blood in stool with suspected ulcer.  PPI was restarted and increased to BID and patient reported that symptoms seemed to be improving at the time of last visit.   She returns today for labs and follow-up.  Today, patient reports resolution of dark stools.  She denies any recent bleeding.  She states that her gastric pain is improving as is appetite.  Overall, she feels much better.  Denies any neurologic complaints. Denies urinary complaints. Patient offers no further specific complaints today.   PAST MEDICAL HISTORY: Past Medical History:  Diagnosis Date   Appetite loss    Cervical radiculopathy    Chemotherapy-induced nausea    Hiatal hernia    Hypercholesterolemia    Hyperlipidemia    Hypertension    IBS (irritable bowel syndrome)    Malignant  neoplasm of upper-outer quadrant of female breast ([place]) 04/11/2009   Right breast, invasive ductal carcinoma, 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 Arimidex therapy completed September 2015   Mild mitral valve prolapse    Personal history of malignant neoplasm of breast 2010   Personal history of radiation therapy 2010   mammosite   T2DM (type 2 diabetes mellitus) ([place]) 2011    PAST SURGICAL HISTORY:  Past Surgical History:  Procedure Laterality Date   ABDOMINAL EXPLORATION SURGERY  2000   ovarian cyst   BREAST BIOPSY Right 2010   +    BREAST EXCISIONAL BIOPSY Right 2010   + mammo site inasive mammo ca   BREAST LUMPECTOMY Right 2010   [facility]   COLONOSCOPY  07/19/2012   Normal exam, Dr. Bary Castilla   COLONOSCOPY WITH PROPOFOL N/A 04/21/2022   Procedure: COLONOSCOPY WITH PROPOFOL;  Surgeon: Robert Bellow, MD;  Location: [facility] ENDOSCOPY;  Service: Endoscopy;  Laterality: N/A;   MASTECTOMY Right 2010   partial/ lumpectomy   PORTACATH PLACEMENT Left 05/19/2022   Procedure: INSERTION PORT-A-CATH;  Surgeon: Robert Bellow, MD;  Location: [facility] ORS;  Service: General;  Laterality: Left;   SIMPLE MASTECTOMY WITH AXILLARY SENTINEL NODE BIOPSY Right 04/26/2022   Procedure: SIMPLE MASTECTOMY WITH AXILLARY SENTINEL NODE BIOPSY;  Surgeon: Robert Bellow, MD;  Location: [facility] ORS;  Service: General;  Laterality: Right;  RNFA to assist   TUBAL LIGATION  20 years ago    HEMATOLOGY/ONCOLOGY HISTORY:  Oncology History Overview Note  She has a remote history of right breast cancer, T1bN0, ER positive,s/p lumpectomy  and MammoSite radiation and 5 years of Arimidex.    02/19/22 screening mammogram bilaterally showed  1. Suspicious right breast mass at the 10 o'clock position 8 cm from the nipple. Recommend ultrasound-guided biopsy. 2. Indeterminate right breast mass at the 6 o'clock position 4 cm from the nipple. Recommend ultrasound-guided biopsy. 3.  No suspicious right axillary lymphadenopathy.   04/12/2022 Unilateral right breast diagnostic mammogram showed 1. Suspicious right breast mass at the 10 o'clock position 8 cm from the nipple. Recommend ultrasound-guided biopsy. 2. Indeterminate right breast mass at the 6 o'clock position 4 cm from the nipple. Recommend ultrasound-guided biopsy. 3. No suspicious right axillary lymphadenopathy.   04/13/2022 right breast mass 6 o'clock position 1 cm from the nipple showed invasive mammary carcinoma, no special type. Grade 3, DCIS present high grade, LVI not identified. ER-, PR 1-10%, HER 2 -   04/13/2022, right breast mass 10:00 7 cm from nipple biopsy showed invasive mammary carcinoma, grade 3, high-grade DCIS with focal comedonecrosis, lymphovascular invasion not identified.  ER -, PR 1 to 10%, HER2-    04/26/2022, right breast mastectomy with axillary dissection Invasive mammary carcinoma, no special type, multifocal, DCIS high-grade, benign nipple/areola.  2 deposits of invasive mammary carcinoma, no definite residual lymph node identified.   Breast cancer ([place])  05/10/2022 Initial Diagnosis   Breast cancer ([place])   05/10/2022 Cancer Staging   Staging form: Breast, AJCC 8th Edition - Pathologic stage from 05/10/2022: Stage IIA (pT1c, pN1a, cM0, G3, ER-, PR+, HER2-) - Signed by Earlie Server, MD on 05/10/2022 Stage prefix: Initial diagnosis Histologic grading system: 3 grade system   05/24/2022 - 05/24/2022 Chemotherapy   Patient is on Treatment Plan : BREAST Pembrolizumab (200) D1 + Carboplatin (5) D1 + Paclitaxel (80) D1,8,15 q21d X 4 cycles / Pembrolizumab (200) D1 + AC D1 q21d x 4 cycles     05/27/2022 - 08/13/2022 Chemotherapy   Patient is on Treatment Plan : BREAST Pembrolizumab (200) D1 + Carboplatin (5) D1 + Paclitaxel (80) D1,8,15 q21d X 4 cycles / Pembrolizumab (200) D1 + AC D1 q21d x 4 cycles     05/27/2022 -  Chemotherapy   Patient is on Treatment Plan : BREAST Pembrolizumab (200) D1 + Carboplatin  (5) D1 + Paclitaxel (80) D1,8,15 q21d X 4 cycles / Pembrolizumab (200) D1 + AC D1 q21d x 4 cycles     Carcinoma of upper-outer quadrant of right breast in female, estrogen receptor negative ([place])  05/26/2022 Initial Diagnosis   Carcinoma of upper-outer quadrant of right breast in female, estrogen receptor negative ([place])   05/26/2022 Cancer Staging   Staging form: Breast, AJCC 8th Edition - Pathologic: Stage IIA (pT1c, pN1, cM0, G3, ER-, PR-, HER2-) - Signed by Cammie Sickle, MD on 05/26/2022 Multigene prognostic tests performed: None Histologic grading system: 3 grade system   05/27/2022 -  Chemotherapy   Patient is on Treatment Plan : BREAST Pembrolizumab (200) D1 + Carboplatin (5) D1 + Paclitaxel (80) D1,8,15 q21d X 4 cycles / Pembrolizumab (200) D1 + AC D1 q21d x 4 cycles       ALLERGIES:  is allergic to levaquin [levofloxacin in d5w], losartan, metoprolol, saxagliptin, amlodipine-olmesartan, canagliflozin, egg white [egg white (egg protein)], gabapentin, glipizide, gluten meal, lisinopril, metformin and related, milk (cow), milk-related compounds, shrimp extract allergy skin test, tramadol, zyprexa [olanzapine], actos [pioglitazone], atorvastatin, carvedilol, and prednisone.  MEDICATIONS:  Current Outpatient Medications  Medication Sig Dispense Refill   aspirin EC 81 MG tablet Take 1 tablet (81 mg total)  by mouth daily. (Patient not taking: Reported on 10/04/2022) 30 tablet 0   atenolol (TENORMIN) 25 MG tablet Take 1 tablet (25 mg total) by mouth daily. 90 tablet 0   azelastine (ASTELIN) 0.1 % nasal spray Place 2 sprays into both nostrils daily as needed for allergies or rhinitis. Use in each nostril as directed     betamethasone, augmented, (DIPROLENE) 0.05 % lotion Apply 1 application topically 2 (two) times daily. (Patient not taking: Reported on 10/04/2022)     Blood Glucose Monitoring Suppl (ONETOUCH VERIO) w/Device KIT 1 Device by Does not apply route once. 1 kit 0    cholecalciferol (VITAMIN D) 1000 UNITS tablet Take 1,000 Units by mouth daily.     ciprofloxacin (CIPRO) 500 MG tablet Take 1 tablet (500 mg total) by mouth 2 (two) times daily. 14 tablet 0   Cyanocobalamin (VITAMIN B-12 PO) Take 1 tablet by mouth daily as needed (fatigue).     dronabinol (MARINOL) 2.5 MG capsule Take 1 capsule (2.5 mg total) by mouth daily before lunch. (Patient not taking: Reported on 09/28/2022) 60 capsule 0   EPINEPHrine (EPIPEN 2-PAK) 0.3 mg/0.3 mL IJ SOAJ injection Inject 0.3 mg into the muscle as needed for anaphylaxis. (Patient not taking: Reported on 09/03/2022) 2 each 0   famotidine (PEPCID) 20 MG tablet Take 20 mg by mouth 2 (two) times daily.     glucose blood (ONETOUCH VERIO) test strip Use as instructed 100 each 12   Lancets (ONETOUCH ULTRASOFT) lancets Use as instructed 100 each 3   levocetirizine (XYZAL) 5 MG tablet Take 1 tablet (5 mg total) by mouth every evening. (Patient not taking: Reported on 10/04/2022) 90 tablet 1   lidocaine-prilocaine (EMLA) cream Apply 1 application. topically as needed. Apply to port and cover with saran wrap 1-2 hours prior to port access 30 g 1   magnesium oxide (MAG-OX) 400 (240 Mg) MG tablet Take 1 tablet (400 mg total) by mouth daily. 15 tablet 0   montelukast (SINGULAIR) 10 MG tablet Take 1 tablet (10 mg total) by mouth at bedtime. (Patient not taking: Reported on 10/04/2022) 90 tablet 1   Multiple Vitamin (MULTIVITAMIN WITH MINERALS) TABS tablet Take 1 tablet by mouth 2 (two) times a week.     ondansetron (ZOFRAN) 8 MG tablet Take 1 tablet (8 mg total) by mouth every 8 (eight) hours as needed for nausea or vomiting. 40 tablet 1   OZEMPIC, 0.25 OR 0.5 MG/DOSE, 2 MG/1.5ML SOPN Inject 0.5 mg into the skin every Sunday.     pantoprazole (PROTONIX) 40 MG tablet Take 1 tablet (40 mg total) by mouth daily. 14 tablet 0   promethazine (PHENERGAN) 25 MG tablet Take 1 tablet (25 mg total) by mouth every 6 (six) hours as needed for nausea or  vomiting. 40 tablet 0   rizatriptan (MAXALT-MLT) 10 MG disintegrating tablet Take 5-10 mg by mouth as needed for migraine. May repeat in 2 hours if needed (Patient not taking: Reported on 10/04/2022)     rosuvastatin (CRESTOR) 10 MG tablet Take 1 tablet (10 mg total) by mouth every morning. 90 tablet 1   spironolactone (ALDACTONE) 50 MG tablet Take 1 tablet (50 mg total) by mouth daily. 90 tablet 0   sucralfate (CARAFATE) 1 g tablet Take 1 tablet (1 g total) by mouth 4 (four) times daily -  with meals and at bedtime for 14 days. 56 tablet 0   No current facility-administered medications for this visit.    VITAL SIGNS: There were  no vitals taken for this visit. There were no vitals filed for this visit.   Estimated body mass index is 30.21 kg/m as calculated from the following:   Height as of 10/04/22: '5\' 6"'  (1.676 m).   Weight as of 10/04/22: 187 lb 3.2 oz (84.9 kg).  LABS: CBC:    Component Value Date/Time   WBC 5.1 10/06/2022 0931   HGB 8.4 (L) 10/06/2022 0931   HGB 12.4 03/24/2015 1348   HCT 27.2 (L) 10/06/2022 0931   HCT 38.6 03/24/2015 1348   PLT 104 (L) 10/06/2022 0931   PLT 250 03/24/2015 1348   MCV 95.8 10/06/2022 0931   MCV 86 03/24/2015 1348   NEUTROABS 3.7 10/06/2022 0931   NEUTROABS 3.8 03/24/2015 1348   LYMPHSABS 0.5 (L) 10/06/2022 0931   LYMPHSABS 2.0 03/24/2015 1348   MONOABS 0.6 10/06/2022 0931   MONOABS 0.3 03/24/2015 1348   EOSABS 0.0 10/06/2022 0931   EOSABS 0.2 03/24/2015 1348   BASOSABS 0.0 10/06/2022 0931   BASOSABS 0.0 03/24/2015 1348   Comprehensive Metabolic Panel:    Component Value Date/Time   NA 140 10/06/2022 0931   NA 141 06/26/2019 0000   NA 137 03/24/2015 1348   K 3.8 10/06/2022 0931   K 3.5 03/24/2015 1348   CL 110 10/06/2022 0931   CL 101 03/24/2015 1348   CO2 24 10/06/2022 0931   CO2 31 03/24/2015 1348   BUN 11 10/06/2022 0931   BUN 9 06/26/2019 0000   BUN 11 03/24/2015 1348   CREATININE 0.76 10/06/2022 0931   CREATININE  0.81 12/31/2021 1005   GLUCOSE 120 (H) 10/06/2022 0931   GLUCOSE 162 (H) 03/24/2015 1348   CALCIUM 8.8 (L) 10/06/2022 0931   CALCIUM 8.7 (L) 03/24/2015 1348   AST 20 10/06/2022 0931   AST 19 03/24/2015 1348   ALT 15 10/06/2022 0931   ALT 16 03/24/2015 1348   ALKPHOS 73 10/06/2022 0931   ALKPHOS 112 03/24/2015 1348   BILITOT 0.3 10/06/2022 0931   BILITOT 0.4 09/13/2017 1025   BILITOT 0.3 03/24/2015 1348   PROT 6.7 10/06/2022 0931   PROT 7.0 09/13/2017 1025   PROT 7.2 03/24/2015 1348   ALBUMIN 3.4 (L) 10/06/2022 0931   ALBUMIN 3.9 09/13/2017 1025   ALBUMIN 3.6 03/24/2015 1348    RADIOGRAPHIC STUDIES: No results found.  PERFORMANCE STATUS (ECOG) : 1 - Symptomatic but completely ambulatory  Review of Systems Unless otherwise noted, a complete review of systems is negative.  Physical Exam General: NAD Cardiovascular: regular rate and rhythm Pulmonary: clear ant fields Abdomen: soft, nontender, + bowel sounds GU: no suprapubic tenderness Extremities: no edema, no joint deformities Skin: no rashes Neurological: Weakness but otherwise nonfocal  IMPRESSION/PLAN: Anemia - Hg down to 8.2 today from 9.6 1-week ago.  Likely secondary to GI bleed.  Patient reports that she has not seen any recurrent dark or tarry stools.  She brought stool today to check for occult as well as H. pylori antigen.  Continue PPI and sucralfate.  We will send referral to GI for further work-up and evaluation.  Discussed ER triggers in detail with patient who agreed to present to the ED for any recurrent or worsening symptoms.  RTC next week for repeat labs/fluids  Patient expressed understanding and was in agreement with this plan. She also understands that She can call clinic at any time with any questions, concerns, or complaints.   Thank you for allowing me to participate in the care of this very pleasant  patient.   Time Total: 20 minutes  Visit consisted of counseling and education dealing with  the complex and emotionally intense issues of symptom management in the setting of serious illness.Greater than 50%  of this time was spent counseling and coordinating care related to the above assessment and plan.  Signed by: Altha Harm, PhD, NP-C

## 2022-10-10 LAB — H. PYLORI ANTIGEN, STOOL: H. Pylori Stool Ag, Eia: NEGATIVE

## 2022-10-11 ENCOUNTER — Encounter: Payer: Self-pay | Admitting: Internal Medicine

## 2022-10-11 ENCOUNTER — Inpatient Hospital Stay: Payer: Medicare Other

## 2022-10-11 ENCOUNTER — Inpatient Hospital Stay (HOSPITAL_BASED_OUTPATIENT_CLINIC_OR_DEPARTMENT_OTHER): Payer: Medicare Other | Admitting: Internal Medicine

## 2022-10-11 ENCOUNTER — Ambulatory Visit: Payer: Medicare Other

## 2022-10-11 VITALS — BP 142/77 | HR 84 | Temp 97.2°F | Wt 189.0 lb

## 2022-10-11 DIAGNOSIS — C50411 Malignant neoplasm of upper-outer quadrant of right female breast: Secondary | ICD-10-CM | POA: Diagnosis not present

## 2022-10-11 DIAGNOSIS — D649 Anemia, unspecified: Secondary | ICD-10-CM

## 2022-10-11 DIAGNOSIS — Z5112 Encounter for antineoplastic immunotherapy: Secondary | ICD-10-CM | POA: Diagnosis not present

## 2022-10-11 DIAGNOSIS — C50811 Malignant neoplasm of overlapping sites of right female breast: Secondary | ICD-10-CM

## 2022-10-11 DIAGNOSIS — Z171 Estrogen receptor negative status [ER-]: Secondary | ICD-10-CM

## 2022-10-11 LAB — CBC WITH DIFFERENTIAL/PLATELET
Abs Immature Granulocytes: 0.08 10*3/uL — ABNORMAL HIGH (ref 0.00–0.07)
Basophils Absolute: 0 10*3/uL (ref 0.0–0.1)
Basophils Relative: 1 %
Eosinophils Absolute: 0 10*3/uL (ref 0.0–0.5)
Eosinophils Relative: 0 %
HCT: 27.3 % — ABNORMAL LOW (ref 36.0–46.0)
Hemoglobin: 8.6 g/dL — ABNORMAL LOW (ref 12.0–15.0)
Immature Granulocytes: 2 %
Lymphocytes Relative: 9 %
Lymphs Abs: 0.5 10*3/uL — ABNORMAL LOW (ref 0.7–4.0)
MCH: 30.3 pg (ref 26.0–34.0)
MCHC: 31.5 g/dL (ref 30.0–36.0)
MCV: 96.1 fL (ref 80.0–100.0)
Monocytes Absolute: 0.7 10*3/uL (ref 0.1–1.0)
Monocytes Relative: 14 %
Neutro Abs: 3.9 10*3/uL (ref 1.7–7.7)
Neutrophils Relative %: 74 %
Platelets: 294 10*3/uL (ref 150–400)
RBC: 2.84 MIL/uL — ABNORMAL LOW (ref 3.87–5.11)
RDW: 15.7 % — ABNORMAL HIGH (ref 11.5–15.5)
WBC: 5.2 10*3/uL (ref 4.0–10.5)
nRBC: 0.4 % — ABNORMAL HIGH (ref 0.0–0.2)

## 2022-10-11 LAB — SAMPLE TO BLOOD BANK

## 2022-10-11 LAB — COMPREHENSIVE METABOLIC PANEL
ALT: 12 U/L (ref 0–44)
AST: 18 U/L (ref 15–41)
Albumin: 3.4 g/dL — ABNORMAL LOW (ref 3.5–5.0)
Alkaline Phosphatase: 68 U/L (ref 38–126)
Anion gap: 6 (ref 5–15)
BUN: 11 mg/dL (ref 8–23)
CO2: 25 mmol/L (ref 22–32)
Calcium: 8.9 mg/dL (ref 8.9–10.3)
Chloride: 106 mmol/L (ref 98–111)
Creatinine, Ser: 0.84 mg/dL (ref 0.44–1.00)
GFR, Estimated: >60 mL/min (ref >60)
Glucose, Bld: 131 mg/dL — ABNORMAL HIGH (ref 70–99)
Potassium: 3.5 mmol/L (ref 3.5–5.1)
Sodium: 137 mmol/L (ref 135–145)
Total Bilirubin: 0.3 mg/dL (ref 0.3–1.2)
Total Protein: 6.8 g/dL (ref 6.5–8.1)

## 2022-10-11 MED ORDER — HEPARIN SOD (PORK) LOCK FLUSH 100 UNIT/ML IV SOLN
500.0000 [IU] | Freq: Once | INTRAVENOUS | Status: AC
  Administered 2022-10-11: 500 [IU] via INTRAVENOUS
  Filled 2022-10-11: qty 5

## 2022-10-11 NOTE — Progress Notes (Signed)
[facility] CONSULT NOTE  Patient Care Team: Sowles, [name], MD as PCP - General (Family Medicine) [name], Jeffrey W, MD (General Surgery) Vaught, Creighton, MD as Referring Physician (Otolaryngology) Callwood, Dwayne D, MD as Consulting Physician (Cardiology) Benitez-Graham, Ana, MD as Referring Physician (Dermatology) O'Connell, Thomas L, MD as Consulting Physician (Internal Medicine) Matie Dimaano R, MD as Consulting Physician (Internal Medicine)  CHIEF COMPLAINTS/PURPOSE OF CONSULTATION: Breast cancer  #  Oncology History Overview Note  She has a remote history of right breast cancer, T1bN0, ER positive,s/p lumpectomy and MammoSite radiation and 5 years of Arimidex.    02/19/22 screening mammogram bilaterally showed  1. Suspicious right breast mass at the 10 o'clock position 8 cm from the nipple. Recommend ultrasound-guided biopsy. 2. Indeterminate right breast mass at the 6 o'clock position 4 cm from the nipple. Recommend ultrasound-guided biopsy. 3. No suspicious right axillary lymphadenopathy.   04/12/2022 Unilateral right breast diagnostic mammogram showed 1. Suspicious right breast mass at the 10 o'clock position 8 cm from the nipple. Recommend ultrasound-guided biopsy. 2. Indeterminate right breast mass at the 6 o'clock position 4 cm from the nipple. Recommend ultrasound-guided biopsy. 3. No suspicious right axillary lymphadenopathy.   04/13/2022 right breast mass 6 o'clock position 1 cm from the nipple showed invasive mammary carcinoma, no special type. Grade 3, DCIS present high grade, LVI not identified. ER-, PR 1-10%, HER 2 -   04/13/2022, right breast mass 10:00 7 cm from nipple biopsy showed invasive mammary carcinoma, grade 3, high-grade DCIS with focal comedonecrosis, lymphovascular invasion not identified.  ER -, PR 1 to 10%, HER2-    04/26/2022, right breast mastectomy with axillary dissection Invasive mammary carcinoma, no special type, multifocal, DCIS  high-grade, benign nipple/areola.  2 deposits of invasive mammary carcinoma, no definite residual lymph node identified.   Breast cancer (HCC)  05/10/2022 Initial Diagnosis   Breast cancer (HCC)   05/10/2022 Cancer Staging   Staging form: Breast, AJCC 8th Edition - Pathologic stage from 05/10/2022: Stage IIA (pT1c, pN1a, cM0, G3, ER-, PR+, HER2-) - Signed by Yu, Zhou, MD on 05/10/2022 Stage prefix: Initial diagnosis Histologic grading system: 3 grade system   05/24/2022 - 05/24/2022 Chemotherapy   Patient is on Treatment Plan : BREAST Pembrolizumab (200) D1 + Carboplatin (5) D1 + Paclitaxel (80) D1,8,15 q21d X 4 cycles / Pembrolizumab (200) D1 + AC D1 q21d x 4 cycles     05/27/2022 - 08/13/2022 Chemotherapy   Patient is on Treatment Plan : BREAST Pembrolizumab (200) D1 + Carboplatin (5) D1 + Paclitaxel (80) D1,8,15 q21d X 4 cycles / Pembrolizumab (200) D1 + AC D1 q21d x 4 cycles     05/27/2022 -  Chemotherapy   Patient is on Treatment Plan : BREAST Pembrolizumab (200) D1 + Carboplatin (5) D1 + Paclitaxel (80) D1,8,15 q21d X 4 cycles / Pembrolizumab (200) D1 + AC D1 q21d x 4 cycles     Carcinoma of upper-outer quadrant of right breast in female, estrogen receptor negative (HCC)  05/26/2022 Initial Diagnosis   Carcinoma of upper-outer quadrant of right breast in female, estrogen receptor negative (HCC)   05/26/2022 Cancer Staging   Staging form: Breast, AJCC 8th Edition - Pathologic: Stage IIA (pT1c, pN1, cM0, G3, ER-, PR-, HER2-) - Signed by Rainee Sweatt R, MD on 05/26/2022 Multigene prognostic tests performed: None Histologic grading system: 3 grade system   05/27/2022 -  Chemotherapy   Patient is on Treatment Plan : BREAST Pembrolizumab (200) D1 + Carboplatin (5) D1 + Paclitaxel (80)   D1,8,15 q21d X 4 cycles / Pembrolizumab (200) D1 + AC D1 q21d x 4 cycles       HISTORY OF PRESENTING ILLNESS: Ambulating independently.  Accompanied by husband.  Morgan Peterson 71 y.o.  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  Patient is currently status post cycle #2 of Adriamycin Cytoxan-Keytruda.  In the interim patient was evaluated in symptom management clinic x2 for black-colored stools; worsening anemia.  Patient also currently off of iron pills.  Patient also felt poorly after her growth factor injection-states that symptoms abdominal pain joint pains poor appetite started after her growth factor injection.  Patient's symptoms are improving not resolved.  Patient continues to have neuropathy in feel and hands stable, not improving.  She is not on gabapentin.  Appetite is improving with Marinol.    Review of Systems  Constitutional:  Positive for malaise/fatigue. Negative for chills, diaphoresis, fever and weight loss.  HENT:  Negative for nosebleeds and sore throat.   Eyes:  Negative for double vision.  Respiratory:  Negative for cough, hemoptysis, sputum production, shortness of breath and wheezing.   Cardiovascular:  Negative for chest pain, palpitations, orthopnea and leg swelling.  Gastrointestinal:  Negative for abdominal pain, blood in stool, constipation, diarrhea, heartburn, melena, nausea and vomiting.  Genitourinary:  Negative for dysuria, frequency and urgency.  Musculoskeletal:  Negative for back pain and joint pain.  Skin: Negative.  Negative for itching and rash.  Neurological:  Positive for tingling. Negative for dizziness, focal weakness, weakness and headaches.  Endo/Heme/Allergies:  Does not bruise/bleed easily.  Psychiatric/Behavioral:  Negative for depression. The patient is not nervous/anxious and does not have insomnia.      MEDICAL HISTORY:  Past Medical History:  Diagnosis Date  . Appetite loss   . Cervical radiculopathy   . Chemotherapy-induced nausea   . Hiatal hernia   . Hypercholesterolemia   . Hyperlipidemia    . Hypertension   . IBS (irritable bowel syndrome)   . Malignant neoplasm of upper-outer quadrant of female breast ([place]) 04/11/2009   Right breast, invasive ductal carcinoma, 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 Arimidex therapy completed September 2015  . Mild mitral valve prolapse   . Personal history of malignant neoplasm of breast 2010  . Personal history of radiation therapy 2010   mammosite  . T2DM (type 2 diabetes mellitus) ([place]) 2011    SURGICAL HISTORY: Past Surgical History:  Procedure Laterality Date  . ABDOMINAL EXPLORATION SURGERY  2000   ovarian cyst  . BREAST BIOPSY Right 2010   +   . BREAST EXCISIONAL BIOPSY Right 2010   + mammo site inasive mammo ca  . BREAST LUMPECTOMY Right 2010   [place]  . COLONOSCOPY  07/19/2012   Normal exam, Dr. Bary Castilla  . COLONOSCOPY WITH PROPOFOL N/A 04/21/2022   Procedure: COLONOSCOPY WITH PROPOFOL;  Surgeon: Robert Bellow, MD;  Location: [facility] ENDOSCOPY;  Service: Endoscopy;  Laterality: N/A;  . MASTECTOMY Right 2010   partial/ lumpectomy  . PORTACATH PLACEMENT Left 05/19/2022   Procedure: INSERTION PORT-A-CATH;  Surgeon: Robert Bellow, MD;  Location: [facility] ORS;  Service: General;  Laterality: Left;  . SIMPLE MASTECTOMY WITH AXILLARY SENTINEL NODE BIOPSY Right 04/26/2022   Procedure: SIMPLE MASTECTOMY WITH AXILLARY SENTINEL NODE BIOPSY;  Surgeon: Robert Bellow, MD;  Location: [facility] ORS;  Service:  General;  Laterality: Right;  RNFA to assist  . TUBAL LIGATION  20 years ago    SOCIAL HISTORY: Social History   Socioeconomic History  . Marital status: Married    Spouse name: Antonio  . Number of children: 2  . Years of education: Not on file  . Highest education level: Not on file  Occupational History  . 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 Smoking status: Former    Packs/day: 0.25    Years: 10.00    Total pack years: 2.50    Types:  Cigarettes    Start date: 56    Quit date: 1988    Years since quitting: 35.8  . Smokeless tobacco: Never  . Tobacco comments:    smoking cessation materials not required  Vaping Use  . Vaping Use: Never used  Substance and Sexual Activity  . Alcohol use: Yes    Comment: wine occ  . Drug use: No  . Sexual activity: Not Currently    Comment: husband has ED  Other Topics Concern  . Not on file  Social History Narrative   Oncology nurse on the floor retired.;  Husband retired from [employer].  No smoking.   Social Determinants of Health   Financial Resource Strain: Low Risk  (12/31/2021)   Overall Financial Resource Strain (CARDIA)   . Difficulty of Paying Living Expenses: Not hard at all  Food Insecurity: No Food Insecurity (12/31/2021)   Hunger Vital Sign   . Worried About Charity fundraiser in the Last Year: Never true   . Ran Out of Food in the Last Year: Never true  Transportation Needs: No Transportation Needs (12/31/2021)   PRAPARE - Transportation   . Lack of Transportation (Medical): No   . Lack of Transportation (Non-Medical): No  Physical Activity: Inactive (12/31/2021)   Exercise Vital Sign   . Days of Exercise per Week: 0 days   . Minutes of Exercise per Session: 0 min  Stress: No Stress Concern Present (12/31/2021)   [place]   . Feeling of Stress : Only a little  Social Connections: Moderately Integrated (12/31/2021)   Social Connection and Isolation Panel [NHANES]   . Frequency of Communication with Friends and Family: More than three times a week   . Frequency of Social Gatherings with Friends and Family: Three times a week   . Attends Religious Services: More than 4 times per year   . Active Member of Clubs or Organizations: No   . Attends Archivist Meetings: Never   . Marital Status: Married  Human resources officer Violence: Not At Risk (12/31/2021)   Humiliation, Afraid, Rape, and Kick  questionnaire   . Fear of Current or Ex-Partner: No   . Emotionally Abused: No   . Physically Abused: No   . Sexually Abused: No    FAMILY HISTORY: Family History  Problem Relation Age of Onset  . Osteoporosis Mother   . Diabetes Father   . Kidney disease Father   . Diabetes Brother   . Breast cancer Maternal Aunt   . Bladder Cancer Maternal Aunt   . Cervical cancer Maternal Aunt   . Colon cancer Maternal Uncle   . Lung cancer Maternal Uncle     ALLERGIES:  is allergic to levaquin [levofloxacin in d5w], losartan, metoprolol, saxagliptin, amlodipine-olmesartan, canagliflozin, egg white [egg white (egg protein)], gabapentin, glipizide, gluten meal, lisinopril, metformin and related, milk (  cow), milk-related compounds, shrimp extract allergy skin test, tramadol, zyprexa [olanzapine], actos [pioglitazone], atorvastatin, carvedilol, and prednisone.  MEDICATIONS:  Current Outpatient Medications  Medication Sig Dispense Refill  . atenolol (TENORMIN) 25 MG tablet Take 1 tablet (25 mg total) by mouth daily. 90 tablet 0  . azelastine (ASTELIN) 0.1 % nasal spray Place 2 sprays into both nostrils daily as needed for allergies or rhinitis. Use in each nostril as directed    . Blood Glucose Monitoring Suppl (ONETOUCH VERIO) w/Device KIT 1 Device by Does not apply route once. 1 kit 0  . cholecalciferol (VITAMIN D) 1000 UNITS tablet Take 1,000 Units by mouth daily.    . ciprofloxacin (CIPRO) 500 MG tablet Take 1 tablet (500 mg total) by mouth 2 (two) times daily. 14 tablet 0  . Cyanocobalamin (VITAMIN B-12 PO) Take 1 tablet by mouth daily as needed (fatigue).    . famotidine (PEPCID) 20 MG tablet Take 20 mg by mouth 2 (two) times daily.    Marland Kitchen glucose blood (ONETOUCH VERIO) test strip Use as instructed 100 each 12  . Lancets (ONETOUCH ULTRASOFT) lancets Use as instructed 100 each 3  . lidocaine-prilocaine (EMLA) cream Apply 1 application. topically as needed. Apply to port and cover with saran wrap  1-2 hours prior to port access 30 g 1  . magnesium oxide (MAG-OX) 400 (240 Mg) MG tablet Take 1 tablet (400 mg total) by mouth daily. 15 tablet 0  . Multiple Vitamin (MULTIVITAMIN WITH MINERALS) TABS tablet Take 1 tablet by mouth 2 (two) times a week.    . ondansetron (ZOFRAN) 8 MG tablet Take 1 tablet (8 mg total) by mouth every 8 (eight) hours as needed for nausea or vomiting. 40 tablet 1  . OZEMPIC, 0.25 OR 0.5 MG/DOSE, 2 MG/1.5ML SOPN Inject 0.5 mg into the skin every Sunday.    . pantoprazole (PROTONIX) 40 MG tablet Take 1 tablet (40 mg total) by mouth daily. 14 tablet 0  . promethazine (PHENERGAN) 25 MG tablet Take 1 tablet (25 mg total) by mouth every 6 (six) hours as needed for nausea or vomiting. 40 tablet 0  . rosuvastatin (CRESTOR) 10 MG tablet Take 1 tablet (10 mg total) by mouth every morning. 90 tablet 1  . spironolactone (ALDACTONE) 50 MG tablet Take 1 tablet (50 mg total) by mouth daily. 90 tablet 0  . sucralfate (CARAFATE) 1 g tablet Take 1 tablet (1 g total) by mouth 4 (four) times daily -  with meals and at bedtime for 14 days. 56 tablet 0  . aspirin EC 81 MG tablet Take 1 tablet (81 mg total) by mouth daily. (Patient not taking: Reported on 10/04/2022) 30 tablet 0  . betamethasone, augmented, (DIPROLENE) 0.05 % lotion Apply 1 application topically 2 (two) times daily. (Patient not taking: Reported on 10/04/2022)    . dronabinol (MARINOL) 2.5 MG capsule Take 1 capsule (2.5 mg total) by mouth daily before lunch. (Patient not taking: Reported on 09/28/2022) 60 capsule 0  . EPINEPHrine (EPIPEN 2-PAK) 0.3 mg/0.3 mL IJ SOAJ injection Inject 0.3 mg into the muscle as needed for anaphylaxis. (Patient not taking: Reported on 09/03/2022) 2 each 0  . levocetirizine (XYZAL) 5 MG tablet Take 1 tablet (5 mg total) by mouth every evening. (Patient not taking: Reported on 10/04/2022) 90 tablet 1  . montelukast (SINGULAIR) 10 MG tablet Take 1 tablet (10 mg total) by mouth at bedtime. (Patient not  taking: Reported on 10/04/2022) 90 tablet 1  . rizatriptan (MAXALT-MLT) 10 MG disintegrating  tablet Take 5-10 mg by mouth as needed for migraine. May repeat in 2 hours if needed (Patient not taking: Reported on 10/04/2022)     No current facility-administered medications for this visit.      Marland Kitchen  PHYSICAL EXAMINATION: ECOG PERFORMANCE STATUS: 0 - Asymptomatic  Vitals:   10/11/22 0830  BP: (!) 142/77  Pulse: 84  Temp: (!) 97.2 F (36.2 C)  SpO2: 100%   Filed Weights   10/11/22 0830  Weight: 189 lb (85.7 kg)    Physical Exam Vitals and nursing note reviewed.  HENT:     Head: Normocephalic and atraumatic.     Mouth/Throat:     Pharynx: Oropharynx is clear.  Eyes:     Extraocular Movements: Extraocular movements intact.     Pupils: Pupils are equal, round, and reactive to light.  Cardiovascular:     Rate and Rhythm: Normal rate and regular rhythm.  Pulmonary:     Comments: Decreased breath sounds bilaterally.  Abdominal:     Palpations: Abdomen is soft.  Musculoskeletal:        General: Normal range of motion.     Cervical back: Normal range of motion.  Skin:    General: Skin is warm.  Neurological:     General: No focal deficit present.     Mental Status: She is alert and oriented to person, place, and time.  Psychiatric:        Behavior: Behavior normal.        Judgment: Judgment normal.     LABORATORY DATA:  I have reviewed the data as listed Lab Results  Component Value Date   WBC 5.2 10/11/2022   HGB 8.6 (L) 10/11/2022   HCT 27.3 (L) 10/11/2022   MCV 96.1 10/11/2022   PLT 294 10/11/2022   Recent Labs    10/06/22 0931 10/08/22 0938 10/11/22 0808  NA 140 140 137  K 3.8 3.6 3.5  CL 110 109 106  CO2 _0 GLUCOSE 120* 151* 131*  BUN _1 CREATININE 0.76 0.77 0.84  CALCIUM 8.8* 8.5* 8.9  GFRNONAA >60 >60 >60  PROT 6.7 6.5 6.8  ALBUMIN 3.4* 3.3* 3.4*  AST _2 ALT _3 ALKPHOS 73 68 68  BILITOT 0.3 0.4 0.3     RADIOGRAPHIC STUDIES: I have personally reviewed the radiological images as listed and agreed with the findings in the report. No results found.  ASSESSMENT & PLAN:   Carcinoma of upper-outer quadrant of right breast in female, estrogen receptor negative ([place]) # Stage II triple negative breast cancer [mT1cN1; Grade 3 ]- s/p mastectomy.  Patient currently on adjuvant chemoimmunotherapy [PNPYYFR 102 study]; patient s/p carboplatin weekly Taxol.  Currently on Adriamycin-Cytoxan Keytruda.   #Currently status post cycle #2. Adriamcycin- Cytoxan- Keytruda- moderate anemia/fatigue- June 6th- ECHO- 05/25/2022- 65-70%. TSH AUG 2023-WNL.  Patient feeling poorly-see below.  #Neutropenia/thrombocytopenia secondary to Adriamycin chemotherapy-currently resolved.  Might consider dose reduction.  #Epigastric Abdominal pain/black-colored stool ; NOT on Iron pills-; also off asprin. Awaiting evaluation with GI- [facility]am[facility].    # Nausea- no vomiting- needing IVFs in clinic.  Encourage antiemetics.  Monitor for now.  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   # Hypomagnesemia magnesium 1.5-recommend increasing mag oxide to twice a day.  # DM-we will cut down the steroids to 6 mg.  Blood sugars today 116 [Orange juice]- on ozempic.  Monitor blood sugars closely. STABLE.   #  DISPOSITION: # HOLD Adriamycin-cytoxan-Keytruda today. # DE-Access today. # Follow up MD- in 3 weeks-;labs- cbc/cmp;mag Adriamycin-Cytoxan--keytruda; next day- udenyca- Dr.B       All questions were answered. The patient/family knows to call the clinic with any problems, questions or concerns.       Cammie Sickle, MD 10/11/2022 9:53 AM

## 2022-10-11 NOTE — Progress Notes (Signed)
Patient here for follow up. Patient states after her injection Tuesday she started feeling bad. Patient complains of upper gastric pain, black stools, pain in bones and abdomen. She has little appetite, drinking boost to help. Patient states her symptoms are improving.

## 2022-10-11 NOTE — Assessment & Plan Note (Addendum)
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  Patient currently on adjuvant chemoimmunotherapy [RWCHJSC 383 study]; patient s/p carboplatin weekly Taxol.  Currently on Adriamycin-Cytoxan Keytruda.   #Currently status post cycle #2. Adriamcycin- Cytoxan- Keytruda- moderate anemia/fatigue- June 6th- ECHO- 05/25/2022- 65-70%. TSH AUG 2023-WNL.  Patient feeling poorly-see below.  #Neutropenia/thrombocytopenia secondary to Adriamycin chemotherapy-currently resolved.  Might consider dose reduction.  #Epigastric Abdominal pain/black-colored stool ; NOT on Iron pills-; also off asprin. Awaiting evaluation with GI- Alamnce.    # Nausea- no vomiting- needing IVFs in clinic.  Encourage antiemetics.  Monitor for now.  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   # Hypomagnesemia magnesium 1.5-recommend increasing mag oxide to twice a day.  # DM-we will cut down the steroids to 6 mg.  Blood sugars today 116 [Orange juice]- on ozempic.  Monitor blood sugars closely. STABLE.   # DISPOSITION: # HOLD Adriamycin-cytoxan-Keytruda today. # DE-Access today. # Follow up MD- in 3 weeks-;labs- cbc/cmp;mag Adriamycin-Cytoxan--keytruda; next day- udenyca- Dr.B

## 2022-10-12 ENCOUNTER — Ambulatory Visit: Payer: Medicare Other | Admitting: Gastroenterology

## 2022-10-12 ENCOUNTER — Encounter: Payer: Self-pay | Admitting: Gastroenterology

## 2022-10-12 VITALS — BP 133/79 | HR 88 | Temp 98.4°F | Ht 66.0 in | Wt 188.0 lb

## 2022-10-12 DIAGNOSIS — K921 Melena: Secondary | ICD-10-CM | POA: Diagnosis not present

## 2022-10-12 NOTE — Progress Notes (Signed)
Gastroenterology Consultation  Referring Provider:     Steele Sizer, MD Primary Care Physician:  Steele Sizer, MD Primary Gastroenterologist:  Dr. Allen Norris     Reason for Consultation:     Melena        HPI:   Morgan Peterson is a 71 y.o. y/o female referred for consultation & management of melena by Dr. Ancil Boozer, Drue Stager, MD. this patient comes in today after having a colonoscopy by Dr. Bary Castilla 5 months ago for screening.  The patient states that she had started on chemotherapy for breast cancer and was found to have 2 episodes of black stools.  She denies the stools to be tarry in nature but states that they were the same consistency as her regular stools.  There is no report of any hematemesis.  The patient does report epigastric pain.  The patient has not been taking any Pepto-Bismol or any iron supplements recently.  She quit iron a month ago.  The patient was seen by her oncologist who recommended she come see me for the epigastric pain and melena despite having her procedures in the past by Dr. Bary Castilla.  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 Arimidex therapy completed September 2015   Mild mitral valve prolapse    Personal history of malignant neoplasm of breast 2010   Personal history of radiation therapy 2010   mammosite   T2DM (type 2 diabetes mellitus) ([place]) 2011    Past Surgical History:  Procedure Laterality Date   ABDOMINAL EXPLORATION SURGERY  2000   ovarian cyst   BREAST BIOPSY Right 2010   +    BREAST EXCISIONAL BIOPSY Right 2010   + mammo site inasive mammo ca    BREAST LUMPECTOMY Right 2010   [facility]   COLONOSCOPY  07/19/2012   Normal exam, Dr. Bary Castilla   COLONOSCOPY WITH PROPOFOL N/A 04/21/2022   Procedure: COLONOSCOPY WITH PROPOFOL;  Surgeon: Robert Bellow, MD;  Location: [facility] ENDOSCOPY;  Service: Endoscopy;  Laterality: N/A;   MASTECTOMY Right 2010   partial/ lumpectomy   PORTACATH PLACEMENT Left 05/19/2022   Procedure: INSERTION PORT-A-CATH;  Surgeon: Robert Bellow, MD;  Location: [facility] ORS;  Service: General;  Laterality: Left;   SIMPLE MASTECTOMY WITH AXILLARY SENTINEL NODE BIOPSY Right 04/26/2022   Procedure: SIMPLE MASTECTOMY WITH AXILLARY SENTINEL NODE BIOPSY;  Surgeon: Robert Bellow, MD;  Location: [facility] ORS;  Service: General;  Laterality: Right;  RNFA to assist   TUBAL LIGATION  20 years ago    Prior to Admission medications   Medication Sig Start Date End Date Taking? Authorizing Provider  atenolol (TENORMIN) 25 MG tablet Take 1 tablet (25 mg total) by mouth daily. 08/05/22  Yes Sowles, Drue Stager, MD  azelastine (ASTELIN) 0.1 % nasal spray Place 2 sprays into both nostrils daily as needed for allergies or rhinitis. Use in each nostril as directed   Yes Vaught, Creighton, MD  Blood Glucose Monitoring Suppl (ONETOUCH VERIO) w/Device KIT 1 Device by Does not apply route once. 01/12/16  Yes Ashok Norris, MD  cholecalciferol (VITAMIN  D) 1000 UNITS tablet Take 1,000 Units by mouth daily.   Yes [provider]  ciprofloxacin (CIPRO) 500 MG tablet Take 1 tablet (500 mg total) by mouth 2 (two) times daily. 09/28/22  Yes Borders, Kirt Boys, NP  Cyanocobalamin (VITAMIN B-12 PO) Take 1 tablet by mouth daily as needed (fatigue).   Yes [provider]  dronabinol (MARINOL) 2.5 MG capsule Take 1 capsule (2.5 mg total) by mouth daily before lunch. 09/10/22  Yes Covington, Sarah M, PA-C  EPINEPHrine (EPIPEN 2-PAK) 0.3 mg/0.3 mL IJ SOAJ injection Inject 0.3 mg into the muscle as needed for anaphylaxis. 08/27/22  Yes Sowles, Drue Stager, MD   famotidine (PEPCID) 20 MG tablet Take 20 mg by mouth 2 (two) times daily.   Yes [provider]  glucose blood (ONETOUCH VERIO) test strip Use as instructed 02/13/18  Yes Sowles, Drue Stager, MD  Lancets [facility] ULTRASOFT) lancets Use as instructed 01/07/16  Yes Ashok Norris, MD  levocetirizine (XYZAL) 5 MG tablet Take 1 tablet (5 mg total) by mouth every evening. 10/30/21  Yes Sowles, Drue Stager, MD  lidocaine-prilocaine (EMLA) cream Apply 1 application. topically as needed. Apply to port and cover with saran wrap 1-2 hours prior to port access 05/21/22  Yes Cammie Sickle, MD  magnesium oxide (MAG-OX) 400 (240 Mg) MG tablet Take 1 tablet (400 mg total) by mouth daily. 09/27/22  Yes Borders, Kirt Boys, NP  montelukast (SINGULAIR) 10 MG tablet Take 1 tablet (10 mg total) by mouth at bedtime. 10/30/21  Yes Sowles, Drue Stager, MD  Multiple Vitamin (MULTIVITAMIN WITH MINERALS) TABS tablet Take 1 tablet by mouth 2 (two) times a week.   Yes [provider]  ondansetron (ZOFRAN) 8 MG tablet Take 1 tablet (8 mg total) by mouth every 8 (eight) hours as needed for nausea or vomiting. 05/21/22  Yes Cammie Sickle, MD  OZEMPIC, 0.25 OR 0.5 MG/DOSE, 2 MG/1.5ML SOPN Inject 0.5 mg into the skin every Sunday. 01/16/19  Yes Lonia Farber, MD  pantoprazole (PROTONIX) 40 MG tablet Take 1 tablet (40 mg total) by mouth daily. 10/06/22  Yes Covington, Sarah M, PA-C  promethazine (PHENERGAN) 25 MG tablet Take 1 tablet (25 mg total) by mouth every 6 (six) hours as needed for nausea or vomiting. 09/01/22  Yes Cammie Sickle, MD  rizatriptan (MAXALT-MLT) 10 MG disintegrating tablet Take 5-10 mg by mouth as needed for migraine. May repeat in 2 hours if needed   Yes Jennings Books K, MD  rosuvastatin (CRESTOR) 10 MG tablet Take 1 tablet (10 mg total) by mouth every morning. 06/25/22  Yes Sowles, Drue Stager, MD  spironolactone (ALDACTONE) 50 MG tablet Take 1 tablet (50 mg total) by mouth daily. 08/27/22   Yes Sowles, Drue Stager, MD  sucralfate (CARAFATE) 1 g tablet Take 1 tablet (1 g total) by mouth 4 (four) times daily -  with meals and at bedtime for 14 days. 10/04/22 10/18/22 Yes CovingtonHolli Humbles, PA-C  aspirin EC 81 MG tablet Take 1 tablet (81 mg total) by mouth daily. Patient not taking: Reported on 10/12/2022 08/18/17   Steele Sizer, MD  betamethasone, augmented, (DIPROLENE) 0.05 % lotion Apply 1 application topically 2 (two) times daily. Patient not taking: Reported on 10/12/2022 11/05/21   [provider]    Family History  Problem Relation Age of Onset   Osteoporosis Mother    Diabetes Father    Kidney disease Father    Diabetes Brother    Breast cancer Maternal Aunt    Bladder  Cancer Maternal Aunt    Cervical cancer Maternal Aunt    Colon cancer Maternal Uncle    Lung cancer Maternal Uncle      Social History   Tobacco Use   Smoking status: Former    Packs/day: 0.25    Years: 10.00    Total pack years: 2.50    Types: Cigarettes    Start date: 53    Quit date: 1988    Years since quitting: 35.8   Smokeless tobacco: Never   Tobacco comments:    smoking cessation materials not required  Vaping Use   Vaping Use: Never used  Substance Use Topics   Alcohol use: Yes    Comment: wine occ   Drug use: No    Allergies as of 10/12/2022 - Review Complete 10/12/2022  Allergen Reaction Noted   Levaquin [levofloxacin in d5w] Swelling 01/07/2014   Losartan     Metoprolol Swelling 10/30/2021   Saxagliptin Other (See Comments) 06/30/2015   Amlodipine-olmesartan  04/30/2016   Canagliflozin Other (See Comments) 06/30/2015   Egg white [egg white (egg protein)]  11/06/2019   Gabapentin  04/30/2016   Glipizide  04/30/2016   Gluten meal  11/06/2019   Lisinopril Swelling 06/30/2015   Metformin and related Nausea Only 01/12/2016   Milk (cow)  11/11/2020   Milk-related compounds  11/06/2019   Shrimp extract allergy skin test  11/11/2020   Tramadol Nausea And  Vomiting 06/30/2015   Zyprexa [olanzapine]  10/04/2022   Actos [pioglitazone] Other (See Comments) and Nausea And Vomiting 06/30/2015   Atorvastatin Other (See Comments) 01/17/2017   Carvedilol Other (See Comments) 05/25/2017   Prednisone Palpitations 05/02/2017    Review of Systems:    All systems reviewed and negative except where noted in HPI.   Physical Exam:  BP 133/79   Pulse 88   Temp 98.4 F (36.9 C) (Oral)   Ht _0  (1.676 m)   Wt 188 lb (85.3 kg)   BMI 30.34 kg/m  No LMP recorded. Patient is postmenopausal. General:   Alert,  Well-developed, well-nourished, pleasant and cooperative in NAD Head:  Normocephalic and atraumatic. Eyes:  Sclera clear, no icterus.   Conjunctiva pink. Ears:  Normal auditory acuity. Neck:  Supple; no masses or thyromegaly. Lungs:  Respirations even and unlabored.  Clear throughout to auscultation.   No wheezes, crackles, or rhonchi. No acute distress. Heart:  Regular rate and rhythm; no murmurs, clicks, rubs, or gallops. Abdomen:  Normal bowel sounds.  No bruits.  Soft, mild epigastric tenderness and non-distended without masses, hepatosplenomegaly or hernias noted.  No guarding or rebound tenderness.  Negative Carnett sign.   Rectal:  Deferred.  Pulses:  Normal pulses noted. Extremities:  No clubbing or edema.  No cyanosis. Neurologic:  Alert and oriented x3;  grossly normal neurologically. Skin:  Intact without significant lesions or rashes.  No jaundice. Lymph Nodes:  No significant cervical adenopathy. Psych:  Alert and cooperative. Normal mood and affect.  Imaging Studies: No results found.  Assessment and Plan:   Morgan Peterson is a 71 y.o. y/o female who comes in today with a history of having a colonoscopy 5 months ago by a Garment/textile technologist.  The patient now has black stools and was sent to me for evaluation of a possible GI bleed due to her report of epigastric pain also.  The patient will be set up for a upper endoscopy.  The  patient appears to already be on Protonix.  The patient has been  explained the plan agrees with it.    Lucilla Lame, MD. Marval Regal    Note: This dictation was prepared with Dragon dictation along with smaller phrase technology. Any transcriptional errors that result from this process are unintentional.

## 2022-10-12 NOTE — Addendum Note (Signed)
Addended by: Lurlean Nanny on: 10/12/2022 01:38 PM   Modules accepted: Orders

## 2022-10-13 ENCOUNTER — Ambulatory Visit: Payer: Medicare Other

## 2022-10-14 ENCOUNTER — Ambulatory Visit: Payer: Medicare Other

## 2022-10-14 ENCOUNTER — Encounter: Payer: Medicare Other | Admitting: Hospice and Palliative Medicine

## 2022-10-14 ENCOUNTER — Inpatient Hospital Stay: Payer: Medicare Other

## 2022-10-14 VITALS — BP 105/68 | HR 83 | Temp 99.1°F

## 2022-10-14 DIAGNOSIS — E860 Dehydration: Secondary | ICD-10-CM

## 2022-10-14 DIAGNOSIS — Z5112 Encounter for antineoplastic immunotherapy: Secondary | ICD-10-CM | POA: Diagnosis not present

## 2022-10-14 DIAGNOSIS — D649 Anemia, unspecified: Secondary | ICD-10-CM

## 2022-10-14 LAB — CBC WITH DIFFERENTIAL/PLATELET
Abs Immature Granulocytes: 0.01 10*3/uL (ref 0.00–0.07)
Basophils Absolute: 0 10*3/uL (ref 0.0–0.1)
Basophils Relative: 1 %
Eosinophils Absolute: 0 10*3/uL (ref 0.0–0.5)
Eosinophils Relative: 0 %
HCT: 27.1 % — ABNORMAL LOW (ref 36.0–46.0)
Hemoglobin: 8.5 g/dL — ABNORMAL LOW (ref 12.0–15.0)
Immature Granulocytes: 0 %
Lymphocytes Relative: 16 %
Lymphs Abs: 0.6 10*3/uL — ABNORMAL LOW (ref 0.7–4.0)
MCH: 30.4 pg (ref 26.0–34.0)
MCHC: 31.4 g/dL (ref 30.0–36.0)
MCV: 96.8 fL (ref 80.0–100.0)
Monocytes Absolute: 0.6 10*3/uL (ref 0.1–1.0)
Monocytes Relative: 17 %
Neutro Abs: 2.3 10*3/uL (ref 1.7–7.7)
Neutrophils Relative %: 66 %
Platelets: 321 10*3/uL (ref 150–400)
RBC: 2.8 MIL/uL — ABNORMAL LOW (ref 3.87–5.11)
RDW: 15.8 % — ABNORMAL HIGH (ref 11.5–15.5)
WBC: 3.5 10*3/uL — ABNORMAL LOW (ref 4.0–10.5)
nRBC: 0.6 % — ABNORMAL HIGH (ref 0.0–0.2)

## 2022-10-14 LAB — COMPREHENSIVE METABOLIC PANEL
ALT: 17 U/L (ref 0–44)
AST: 22 U/L (ref 15–41)
Albumin: 3.4 g/dL — ABNORMAL LOW (ref 3.5–5.0)
Alkaline Phosphatase: 64 U/L (ref 38–126)
Anion gap: 5 (ref 5–15)
BUN: 14 mg/dL (ref 8–23)
CO2: 25 mmol/L (ref 22–32)
Calcium: 8.7 mg/dL — ABNORMAL LOW (ref 8.9–10.3)
Chloride: 111 mmol/L (ref 98–111)
Creatinine, Ser: 0.85 mg/dL (ref 0.44–1.00)
GFR, Estimated: >60 mL/min (ref >60)
Glucose, Bld: 168 mg/dL — ABNORMAL HIGH (ref 70–99)
Potassium: 3.7 mmol/L (ref 3.5–5.1)
Sodium: 141 mmol/L (ref 135–145)
Total Bilirubin: 0.1 mg/dL — ABNORMAL LOW (ref 0.3–1.2)
Total Protein: 6.8 g/dL (ref 6.5–8.1)

## 2022-10-14 LAB — SAMPLE TO BLOOD BANK

## 2022-10-14 LAB — MAGNESIUM: Magnesium: 1.8 mg/dL (ref 1.7–2.4)

## 2022-10-14 MED ORDER — HEPARIN SOD (PORK) LOCK FLUSH 100 UNIT/ML IV SOLN
500.0000 [IU] | Freq: Once | INTRAVENOUS | Status: AC
  Administered 2022-10-14: 500 [IU] via INTRAVENOUS
  Filled 2022-10-14: qty 5

## 2022-10-14 MED ORDER — SODIUM CHLORIDE 0.9% FLUSH
10.0000 mL | Freq: Once | INTRAVENOUS | Status: AC
  Administered 2022-10-14: 10 mL via INTRAVENOUS
  Filled 2022-10-14: qty 10

## 2022-10-14 MED ORDER — SODIUM CHLORIDE 0.9 % IV SOLN
Freq: Once | INTRAVENOUS | Status: AC
  Filled 2022-10-14: qty 250

## 2022-10-14 NOTE — Progress Notes (Signed)
Pt has an endoscopy scheduled for next Friday with Dr Allen Norris in [place]. C/0 gnawing sensation in stomach. It is not aching and hurting like it was. Pt over all states she feels better today. Labs and IV hydration today.

## 2022-10-18 ENCOUNTER — Encounter (INDEPENDENT_AMBULATORY_CARE_PROVIDER_SITE_OTHER): Payer: Self-pay

## 2022-10-19 ENCOUNTER — Encounter: Payer: Self-pay | Admitting: Gastroenterology

## 2022-10-22 ENCOUNTER — Ambulatory Visit: Payer: Medicare Other | Admitting: Anesthesiology

## 2022-10-22 ENCOUNTER — Encounter: Payer: Self-pay | Admitting: Gastroenterology

## 2022-10-22 ENCOUNTER — Ambulatory Visit
Admission: RE | Admit: 2022-10-22 | Discharge: 2022-10-22 | Disposition: A | Discharge status: Home / Self Care | Payer: Medicare Other | Source: Ambulatory Visit | Attending: Gastroenterology | Admitting: Gastroenterology

## 2022-10-22 ENCOUNTER — Other Ambulatory Visit: Payer: Self-pay

## 2022-10-22 ENCOUNTER — Encounter: Payer: Self-pay | Admitting: Internal Medicine

## 2022-10-22 ENCOUNTER — Encounter
Admission: RE | Disposition: A | Discharge status: Home / Self Care | Payer: Self-pay | Source: Ambulatory Visit | Attending: Gastroenterology

## 2022-10-22 DIAGNOSIS — I10 Essential (primary) hypertension: Secondary | ICD-10-CM | POA: Insufficient documentation

## 2022-10-22 DIAGNOSIS — Z7985 Long-term (current) use of injectable non-insulin antidiabetic drugs: Secondary | ICD-10-CM | POA: Insufficient documentation

## 2022-10-22 DIAGNOSIS — E785 Hyperlipidemia, unspecified: Secondary | ICD-10-CM | POA: Diagnosis not present

## 2022-10-22 DIAGNOSIS — K2980 Duodenitis without bleeding: Secondary | ICD-10-CM | POA: Diagnosis not present

## 2022-10-22 DIAGNOSIS — K3189 Other diseases of stomach and duodenum: Secondary | ICD-10-CM

## 2022-10-22 DIAGNOSIS — E119 Type 2 diabetes mellitus without complications: Secondary | ICD-10-CM | POA: Insufficient documentation

## 2022-10-22 DIAGNOSIS — Z87891 Personal history of nicotine dependence: Secondary | ICD-10-CM | POA: Insufficient documentation

## 2022-10-22 DIAGNOSIS — E669 Obesity, unspecified: Secondary | ICD-10-CM | POA: Insufficient documentation

## 2022-10-22 DIAGNOSIS — K921 Melena: Secondary | ICD-10-CM

## 2022-10-22 DIAGNOSIS — K449 Diaphragmatic hernia without obstruction or gangrene: Secondary | ICD-10-CM | POA: Diagnosis not present

## 2022-10-22 DIAGNOSIS — Z803 Family history of malignant neoplasm of breast: Secondary | ICD-10-CM | POA: Insufficient documentation

## 2022-10-22 DIAGNOSIS — I341 Nonrheumatic mitral (valve) prolapse: Secondary | ICD-10-CM | POA: Insufficient documentation

## 2022-10-22 DIAGNOSIS — Z6830 Body mass index (BMI) 30.0-30.9, adult: Secondary | ICD-10-CM | POA: Insufficient documentation

## 2022-10-22 HISTORY — PX: ESOPHAGOGASTRODUODENOSCOPY (EGD) WITH PROPOFOL: SHX5813

## 2022-10-22 HISTORY — DX: Polyneuropathy, unspecified: G62.9

## 2022-10-22 HISTORY — DX: Unspecified asthma, uncomplicated: J45.909

## 2022-10-22 LAB — GLUCOSE, CAPILLARY: Glucose-Capillary: 127 mg/dL — ABNORMAL HIGH (ref 70–99)

## 2022-10-22 SURGERY — ESOPHAGOGASTRODUODENOSCOPY (EGD) WITH PROPOFOL
Anesthesia: General | Site: Mouth

## 2022-10-22 MED ORDER — PROPOFOL 10 MG/ML IV BOLUS
INTRAVENOUS | Status: DC | PRN
  Administered 2022-10-22: 40 mg via INTRAVENOUS
  Administered 2022-10-22: 60 mg via INTRAVENOUS

## 2022-10-22 MED ORDER — STERILE WATER FOR IRRIGATION IR SOLN
Status: DC | PRN
  Administered 2022-10-22: 50 mL

## 2022-10-22 MED ORDER — LIDOCAINE HCL (CARDIAC) PF 100 MG/5ML IV SOSY
PREFILLED_SYRINGE | INTRAVENOUS | Status: DC | PRN
  Administered 2022-10-22: 40 mg via INTRAVENOUS

## 2022-10-22 MED ORDER — SODIUM CHLORIDE 0.9 % IV SOLN: INTRAVENOUS | Status: DC

## 2022-10-22 MED ORDER — GLYCOPYRROLATE 0.2 MG/ML IJ SOLN
INTRAMUSCULAR | Status: DC | PRN
  Administered 2022-10-22: .1 mg via INTRAVENOUS

## 2022-10-22 SURGICAL SUPPLY — 18 items
BALLN DILATOR 12-15 8 (BALLOONS)
BALLN DILATOR 15-18 8 (BALLOONS)
BALLN DILATOR CRE 0-12 8 (BALLOONS)
BALLN DILATOR ESOPH 8 10 CRE (MISCELLANEOUS) IMPLANT
BALLOON DILATOR 12-15 8 (BALLOONS) IMPLANT
BALLOON DILATOR 15-18 8 (BALLOONS) IMPLANT
BALLOON DILATOR CRE 0-12 8 (BALLOONS) IMPLANT
BLOCK BITE 60FR ADLT L/F GRN (MISCELLANEOUS) ×1 IMPLANT
FORCEPS BIOP RAD 4 LRG CAP 4 (CUTTING FORCEPS) IMPLANT
GOWN CVR UNV OPN BCK APRN NK (MISCELLANEOUS) ×2 IMPLANT
GOWN ISOL THUMB LOOP REG UNIV (MISCELLANEOUS) ×2
KIT PRC NS LF DISP ENDO (KITS) ×1 IMPLANT
KIT PROCEDURE OLYMPUS (KITS) ×1
MANIFOLD NEPTUNE II (INSTRUMENTS) ×1 IMPLANT
SYR INFLATION 60ML (SYRINGE) IMPLANT
TRAP ETRAP POLY (MISCELLANEOUS) IMPLANT
WATER STERILE IRR 250ML POUR (IV SOLUTION) ×1 IMPLANT
WIRE CRE 18-20MM 8CM F G (MISCELLANEOUS) IMPLANT

## 2022-10-22 NOTE — Op Note (Signed)
[facility] Gastroenterology Patient Name: Morgan Peterson Procedure Date: 10/22/2022 8:07 AM MRN: [id number] Account #: [id number] Date of Birth: 05/05/1951 Admit Type: Outpatient Age: 71 Room: [facility] OR ROOM 01 Gender: Female Note Status: Finalized Instrument Name: [id number] Procedure:             Upper GI endoscopy Indications:           Melena Providers:             Lucilla Lame MD, MD Referring MD:          Bethena Roys. Sowles, MD (Referring MD) Medicines:             Propofol per Anesthesia Complications:         No immediate complications. Procedure:             Pre-Anesthesia Assessment:                        - Prior to the procedure, a History and Physical was                         performed, and patient medications and allergies were                         reviewed. The patient's tolerance of previous                         anesthesia was also reviewed. The risks and benefits                         of the procedure and the sedation options and risks                         were discussed with the patient. All questions were                         answered, and informed consent was obtained. Prior                         Anticoagulants: The patient has taken no anticoagulant                         or antiplatelet agents. ASA Grade Assessment: II - A                         patient with mild systemic disease. After reviewing                         the risks and benefits, the patient was deemed in                         satisfactory condition to undergo the procedure.                        After obtaining informed consent, the endoscope was                         passed under direct vision. Throughout the procedure,  the patient's blood pressure, pulse, and oxygen                         saturations were monitored continuously. The Endoscope                         was introduced through the mouth, and advanced to the                          second part of duodenum. The upper GI endoscopy was                         accomplished without difficulty. The patient tolerated                         the procedure well. Findings:      A large hiatal hernia was present.      The entire examined stomach was normal.      Localized moderate mucosal changes were found in the ampulla. Biopsies       were taken with a cold forceps for histology. Impression:            - Large hiatal hernia.                        - Normal stomach.                        - Mucosal changes in the duodenum. Biopsied. Recommendation:        - Discharge patient to home.                        - Resume previous diet.                        - Continue present medications.                        - Await pathology results. Procedure Code(s):     --- Professional ---                        [phone], Esophagogastroduodenoscopy, flexible,                         transoral; with biopsy, single or multiple Diagnosis Code(s):     --- Professional ---                        K92.1, Melena (includes Hematochezia)                        K31.89, Other diseases of stomach and duodenum CPT copyright 2022 American [employer] Association. All rights reserved. The codes documented in this report are preliminary and upon coder review may  be revised to meet current compliance requirements. Lucilla Lame MD, MD 10/22/2022 8:24:25 AM This report has been signed electronically. Number of Addenda: 0 Note Initiated On: 10/22/2022 8:07 AM Total Procedure Duration: 0 hours 4 minutes 55 seconds  Estimated Blood Loss:  Estimated blood loss: none.      [facility]

## 2022-10-22 NOTE — Anesthesia Postprocedure Evaluation (Signed)
Anesthesia Post Note  Patient: Morgan Peterson  Procedure(s) Performed: ESOPHAGOGASTRODUODENOSCOPY (EGD) WITH PROPOFOL (Mouth)  Patient location during evaluation: PACU Anesthesia Type: General Level of consciousness: awake and alert Pain management: pain level controlled Vital Signs Assessment: post-procedure vital signs reviewed and stable Respiratory status: spontaneous breathing, nonlabored ventilation, respiratory function stable and patient connected to nasal cannula oxygen Cardiovascular status: blood pressure returned to baseline and stable Postop Assessment: no apparent nausea or vomiting Anesthetic complications: no   No notable events documented.   Last Vitals:  Vitals:   10/22/22 0835 10/22/22 0840  BP:  120/78  Pulse: 75 75  Resp: 20 20  Temp:    SpO2: 96% 96%    Last Pain:  Vitals:   10/22/22 0840  TempSrc:   PainSc: 0-No pain                 Arita Miss

## 2022-10-22 NOTE — H&P (Signed)
Morgan Lame, MD [facility] [address], [place], [place] [address] Phone:[phone] Fax : [phone]  Primary Care Physician:  Steele Sizer, MD Primary Gastroenterologist:  Dr. Allen Norris  Pre-Procedure History & Physical: HPI:  Morgan Peterson is a 71 y.o. female is here for an endoscopy.   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 Arimidex therapy completed September 2015   Mild mitral valve prolapse    Neuropathy    feet, from chemo   Personal history of malignant neoplasm of breast 2010   Personal history of radiation therapy 2010   mammosite   T2DM (type 2 diabetes mellitus) ([place]) 2011    Past Surgical History:  Procedure Laterality Date   ABDOMINAL EXPLORATION SURGERY  2000   ovarian cyst   BREAST BIOPSY Right 2010   +    BREAST EXCISIONAL BIOPSY Right 2010   + mammo site inasive mammo ca   BREAST LUMPECTOMY Right 2010   [facility]   COLONOSCOPY  07/19/2012   Normal exam, Dr. Bary Castilla   COLONOSCOPY WITH PROPOFOL N/A 04/21/2022   Procedure: COLONOSCOPY WITH PROPOFOL;  Surgeon: Robert Bellow, MD;  Location: [facility] ENDOSCOPY;  Service: Endoscopy;  Laterality: N/A;   MASTECTOMY Right 2010   partial/ lumpectomy   PORTACATH PLACEMENT Left 05/19/2022   Procedure: INSERTION PORT-A-CATH;  Surgeon: Robert Bellow, MD;  Location: [facility] ORS;  Service: General;  Laterality: Left;   SIMPLE MASTECTOMY WITH AXILLARY SENTINEL NODE BIOPSY Right 04/26/2022   Procedure: SIMPLE MASTECTOMY WITH AXILLARY SENTINEL NODE BIOPSY;  Surgeon: Robert Bellow, MD;  Location: [facility] ORS;   Service: General;  Laterality: Right;  RNFA to assist   TUBAL LIGATION  20 years ago    Prior to Admission medications   Medication Sig Start Date End Date Taking? Authorizing Provider  atenolol (TENORMIN) 25 MG tablet Take 1 tablet (25 mg total) by mouth daily. 08/05/22  Yes Sowles, Drue Stager, MD  azelastine (ASTELIN) 0.1 % nasal spray Place 2 sprays into both nostrils daily as needed for allergies or rhinitis. Use in each nostril as directed   Yes Vaught, Creighton, MD  cholecalciferol (VITAMIN D) 1000 UNITS tablet Take 1,000 Units by mouth daily.   Yes [provider]  Cyanocobalamin (VITAMIN B-12 PO) Take 1 tablet by mouth daily as needed (fatigue).   Yes [provider]  dronabinol (MARINOL) 2.5 MG capsule Take 1 capsule (2.5 mg total) by mouth daily before lunch. 09/10/22  Yes Covington, Sarah M, PA-C  EPINEPHrine (EPIPEN 2-PAK) 0.3 mg/0.3 mL IJ SOAJ injection Inject 0.3 mg into the muscle as needed for anaphylaxis. 08/27/22  Yes Sowles, Drue Stager, MD  famotidine (PEPCID) 20 MG tablet Take 20 mg by mouth 2 (two) times daily.   Yes [provider]  levocetirizine (XYZAL) 5 MG tablet Take 1 tablet (5 mg total) by mouth every evening. 10/30/21  Yes Sowles, Drue Stager, MD  lidocaine-prilocaine (EMLA) cream Apply 1 application. topically as needed. Apply to port and cover with saran wrap 1-2 hours prior to port access 05/21/22  Yes  Cammie Sickle, MD  magnesium oxide (MAG-OX) 400 (240 Mg) MG tablet Take 1 tablet (400 mg total) by mouth daily. 09/27/22  Yes Borders, Kirt Boys, NP  montelukast (SINGULAIR) 10 MG tablet Take 1 tablet (10 mg total) by mouth at bedtime. 10/30/21  Yes Sowles, Drue Stager, MD  Multiple Vitamin (MULTIVITAMIN WITH MINERALS) TABS tablet Take 1 tablet by mouth 2 (two) times a week.   Yes [provider]  ondansetron (ZOFRAN) 8 MG tablet Take 1 tablet (8 mg total) by mouth every 8 (eight) hours as needed for nausea or vomiting. 05/21/22  Yes Cammie Sickle, MD  OZEMPIC, 0.25 OR 0.5 MG/DOSE, 2 MG/1.5ML SOPN Inject 0.5 mg into the skin every Sunday. 01/16/19  Yes Lonia Farber, MD  pantoprazole (PROTONIX) 40 MG tablet Take 1 tablet (40 mg total) by mouth daily. 10/06/22  Yes Covington, Sarah M, PA-C  promethazine (PHENERGAN) 25 MG tablet Take 1 tablet (25 mg total) by mouth every 6 (six) hours as needed for nausea or vomiting. 09/01/22  Yes Cammie Sickle, MD  rizatriptan (MAXALT-MLT) 10 MG disintegrating tablet Take 5-10 mg by mouth as needed for migraine. May repeat in 2 hours if needed   Yes Jennings Books K, MD  rosuvastatin (CRESTOR) 10 MG tablet Take 1 tablet (10 mg total) by mouth every morning. 06/25/22  Yes Sowles, Drue Stager, MD  spironolactone (ALDACTONE) 50 MG tablet Take 1 tablet (50 mg total) by mouth daily. 08/27/22  Yes Steele Sizer, MD  aspirin EC 81 MG tablet Take 1 tablet (81 mg total) by mouth daily. Patient not taking: Reported on 10/12/2022 08/18/17   Steele Sizer, MD  betamethasone, augmented, (DIPROLENE) 0.05 % lotion Apply 1 application topically 2 (two) times daily. Patient not taking: Reported on 10/12/2022 11/05/21   [provider]  Blood Glucose Monitoring Suppl (ONETOUCH VERIO) w/Device KIT 1 Device by Does not apply route once. 01/12/16   Ashok Norris, MD  glucose blood (ONETOUCH VERIO) test strip Use as instructed 02/13/18   Steele Sizer, MD  Lancets [facility] ULTRASOFT) lancets Use as instructed 01/07/16   Ashok Norris, MD  sucralfate (CARAFATE) 1 g tablet Take 1 tablet (1 g total) by mouth 4 (four) times daily -  with meals and at bedtime for 14 days. 10/04/22 10/18/22  Hughie Closs, PA-C    Allergies as of 10/12/2022 - Review Complete 10/12/2022  Allergen Reaction Noted   Levaquin [levofloxacin in d5w] Swelling 01/07/2014   Losartan     Metoprolol Swelling 10/30/2021   Saxagliptin Other (See Comments) 06/30/2015   Amlodipine-olmesartan  04/30/2016   Canagliflozin Other  (See Comments) 06/30/2015   Egg white [egg white (egg protein)]  11/06/2019   Gabapentin  04/30/2016   Glipizide  04/30/2016   Gluten meal  11/06/2019   Lisinopril Swelling 06/30/2015   Metformin and related Nausea Only 01/12/2016   Milk (cow)  11/11/2020   Milk-related compounds  11/06/2019   Shrimp extract allergy skin test  11/11/2020   Tramadol Nausea And Vomiting 06/30/2015   Zyprexa [olanzapine]  10/04/2022   Actos [pioglitazone] Other (See Comments) and Nausea And Vomiting 06/30/2015   Atorvastatin Other (See Comments) 01/17/2017   Carvedilol Other (See Comments) 05/25/2017   Prednisone Palpitations 05/02/2017    Family History  Problem Relation Age of Onset   Osteoporosis Mother    Diabetes Father    Kidney disease Father    Diabetes Brother    Breast cancer Maternal Aunt    Bladder Cancer Maternal  Aunt    Cervical cancer Maternal Aunt    Colon cancer Maternal Uncle    Lung cancer Maternal Uncle     Social History   Socioeconomic History   Marital status: Married    Spouse name: Antonio   Number of children: 2   Years of education: Not on file   Highest education level: Not on file  Occupational History   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  Tobacco Use   Smoking status: Former    Packs/day: 0.25    Years: 10.00    Total pack years: 2.50    Types: Cigarettes    Start date: 1978    Quit date: 1988    Years since quitting: 35.8   Smokeless tobacco: Never   Tobacco comments:    smoking cessation materials not required  Vaping Use   Vaping Use: Never used  Substance and Sexual Activity   Alcohol use: Yes    Comment: wine occ   Drug use: No   Sexual activity: Not Currently    Comment: husband has ED  Other Topics Concern   Not on file  Social History Narrative   Oncology nurse on the floor retired.;  Husband retired from [employer].  No smoking.   Social Determinants of Health   Financial Resource Strain: Low Risk  (12/31/2021)    Overall Financial Resource Strain (CARDIA)    Difficulty of Paying Living Expenses: Not hard at all  Food Insecurity: No Food Insecurity (12/31/2021)   Hunger Vital Sign    Worried About Running Out of Food in the Last Year: Never true    Ran Out of Food in the Last Year: Never true  Transportation Needs: No Transportation Needs (12/31/2021)   PRAPARE - Hydrologist (Medical): No    Lack of Transportation (Non-Medical): No  Physical Activity: Inactive (12/31/2021)   Exercise Vital Sign    Days of Exercise per Week: 0 days    Minutes of Exercise per Session: 0 min  Stress: No Stress Concern Present (12/31/2021)   [place]    Feeling of Stress : Only a little  Social Connections: Moderately Integrated (12/31/2021)   Social Connection and Isolation Panel [NHANES]    Frequency of Communication with Friends and Family: More than three times a week    Frequency of Social Gatherings with Friends and Family: Three times a week    Attends Religious Services: More than 4 times per year    Active Member of Clubs or Organizations: No    Attends Archivist Meetings: Never    Marital Status: Married  Human resources officer Violence: Not At Risk (12/31/2021)   Humiliation, Afraid, Rape, and Kick questionnaire    Fear of Current or Ex-Partner: No    Emotionally Abused: No    Physically Abused: No    Sexually Abused: No    Review of Systems: See HPI, otherwise negative ROS  Physical Exam: Ht _0  (1.676 m)   Wt 85.3 kg   BMI 30.34 kg/m  General:   Alert,  pleasant and cooperative in NAD Head:  Normocephalic and atraumatic. Neck:  Supple; no masses or thyromegaly. Lungs:  Clear throughout to auscultation.    Heart:  Regular rate and rhythm. Abdomen:  Soft, nontender and nondistended. Normal bowel sounds, without guarding, and without rebound.   Neurologic:  Alert and  oriented x4;  grossly  normal neurologically.  Impression/Plan:  Morgan Peterson is here for an endoscopy to be performed for melena  Risks, benefits, limitations, and alternatives regarding  endoscopy have been reviewed with the patient.  Questions have been answered.  All parties agreeable.   Morgan Lame, MD  10/22/2022, 7:35 AM

## 2022-10-22 NOTE — Transfer of Care (Signed)
Immediate Anesthesia Transfer of Care Note  Patient: Morgan Peterson  Procedure(s) Performed: ESOPHAGOGASTRODUODENOSCOPY (EGD) WITH PROPOFOL (M[place])  Patient Location: PACU  Anesthesia Type: General  Level of Consciousness: awake, alert  and patient cooperative  Airway and Oxygen Therapy: Patient Spontanous Breathing and Patient connected to supplemental oxygen  Post-op Assessment: Post-op Vital signs reviewed, Patient's Cardiovascular Status Stable, Respiratory Function Stable, Patent Airway and No signs of Nausea or vomiting  Post-op Vital Signs: Reviewed and stable  Complications: No notable events documented.

## 2022-10-22 NOTE — Anesthesia Preprocedure Evaluation (Signed)
Anesthesia Evaluation  Patient identified by MRN, date of birth, ID band Patient awake  General Assessment Comment:  HX of PONV in her 75s, none since then  Reviewed: Allergy & Precautions, NPO status , Patient's Chart, lab work & pertinent test results  History of Anesthesia Complications (+) history of anesthetic complications  Airway Mallampati: II   Neck ROM: Full    Dental  (+) Missing, Chipped   Pulmonary neg pulmonary ROS, neg sleep apnea, neg COPD, Patient abstained from smoking.Not current smoker, former smoker   Pulmonary exam normal breath sounds clear to auscultation       Cardiovascular Exercise Tolerance: Good METShypertension, Pt. on medications (-) CAD and (-) Past MI Normal cardiovascular exam+ dysrhythmias  Rhythm:Regular Rate:Normal  ECG 04/20/22:  Sinus bradycardia (HR 54) Otherwise normal ECG  Echo 10/02/21:  NORMAL LEFT VENTRICULAR SYSTOLIC FUNCTION   WITH MILD LVH  NORMAL RIGHT VENTRICULAR SYSTOLIC FUNCTION  MILD VALVULAR REGURGITATION  NO VALVULAR STENOSIS  IRREGULAR HEART RHYTHM CAPTURED THROUGHOUT EXAM  ESTIMATED LVEF >55%  Aortic: NORMAL GRADIENTS  Mitral: MILD MR  MVP: BOTH AMVL & PMVL  Tricuspid: MILD TR (2.59ms)  Pulmonic: TRIVIAL PI    Myocardial Perfusion 04/2017: Normal myocardial perfusion scan no evidence of stress-induced myocardial-ischemia ejection fraction of 70% conclusion negative scan    Neuro/Psych  Headaches  negative psych ROS   GI/Hepatic hiatal hernia,neg GERD  ,,(+)     (-) substance abuse    Endo/Other  diabetes, Type 2  Patient on ozempic, last taken 11 days ago. Denies GI symptoms today  Renal/GU negative Renal ROS     Musculoskeletal   Abdominal   Peds  Hematology Breast CA   Anesthesia Other Findings Cardiology note 02/22/22:  Plan  Patient is stable today.   1. Bradycardia, resolved at this time consider follow-up Holter monitor   2. Palpitations,  likely due to occasional episodes of SVT confirmed by Holter  -Recommend continuing to reduce caffeine intake, avoiding the use of energy drinks, staying adequately hydrated with water daily and practicing healthy stress management techniques.   3. Mild mitral valve prolapse - We will continue conservative therapy at this time  -Education reinforced to the patient on the importance of reporting any signs or symptoms of dyspnea, increased fatigue, dizziness or any other cardiac symptoms to our office. She verbalized understanding of all information.   4. Hypertension, reasonably controlled, patient is normotensive on today -We will continue management with triamterene-hydrochlorothiazide - metoprolol as above.  5. Hyperlipidemia, reasonably controlled  -We will continue rosuvastatin therapy.   6. DM type II, reasonably controlled  -Management per endocrinology.   7. Obesity -Recommend modest weight loss, portion control, daily aerobic exercise for least 30 minutes as tolerated. Reviewed medications.   8. Generalized anxiety disorder, reasonably stable -Management per PCP. -Recommend following healthy stress management techniques such as breathing exercises, stretching daily, following a healthy diet and decreasing caffeine intake.   9. Chronic fatigue with recent improvements in symptoms   Follow up in 6 months.   Reproductive/Obstetrics                              Anesthesia Physical Anesthesia Plan  ASA: 2  Anesthesia Plan: General   Post-op Pain Management: Minimal or no pain anticipated   Induction: Intravenous  PONV Risk Score and Plan: 2 and Propofol infusion, TIVA and Ondansetron  Airway Management Planned: Nasal Cannula  Additional Equipment: None  Intra-op  Plan:   Post-operative Plan:   Informed Consent: I have reviewed the patients History and Physical, chart, labs and discussed the procedure including the risks, benefits and  alternatives for the proposed anesthesia with the patient or authorized representative who has indicated his/her understanding and acceptance.     Dental advisory given  Plan Discussed with: CRNA and Surgeon  Anesthesia Plan Comments: (Discussed risks of anesthesia with patient, including possibility of difficulty with spontaneous ventilation under anesthesia necessitating airway intervention, PONV, and rare risks such as cardiac or respiratory or neurological events, and allergic reactions. Discussed the role of CRNA in patient's perioperative care. Patient understands.)         Anesthesia Quick Evaluation

## 2022-10-25 ENCOUNTER — Encounter: Payer: Self-pay | Admitting: Gastroenterology

## 2022-10-26 ENCOUNTER — Encounter: Payer: Self-pay | Admitting: Gastroenterology

## 2022-10-26 LAB — SURGICAL PATHOLOGY

## 2022-10-28 LAB — HEMOGLOBIN A1C: Hemoglobin A1C: 6.6

## 2022-10-29 MED FILL — Fosaprepitant Dimeglumine For IV Infusion 150 MG (Base Eq): INTRAVENOUS | Qty: 5 | Status: AC

## 2022-10-30 ENCOUNTER — Other Ambulatory Visit: Payer: Self-pay | Admitting: Family Medicine

## 2022-10-30 DIAGNOSIS — J302 Other seasonal allergic rhinitis: Secondary | ICD-10-CM

## 2022-10-30 DIAGNOSIS — L509 Urticaria, unspecified: Secondary | ICD-10-CM

## 2022-11-01 ENCOUNTER — Inpatient Hospital Stay: Payer: Medicare Other | Attending: Oncology

## 2022-11-01 ENCOUNTER — Inpatient Hospital Stay: Payer: Medicare Other

## 2022-11-01 ENCOUNTER — Other Ambulatory Visit: Payer: Self-pay | Admitting: Oncology

## 2022-11-01 ENCOUNTER — Inpatient Hospital Stay (HOSPITAL_BASED_OUTPATIENT_CLINIC_OR_DEPARTMENT_OTHER): Payer: Medicare Other | Admitting: Oncology

## 2022-11-01 ENCOUNTER — Encounter: Payer: Self-pay | Admitting: Oncology

## 2022-11-01 ENCOUNTER — Ambulatory Visit: Payer: Medicare Other | Admitting: Internal Medicine

## 2022-11-01 ENCOUNTER — Other Ambulatory Visit: Payer: Self-pay | Admitting: *Deleted

## 2022-11-01 VITALS — BP 134/71 | HR 72 | Temp 97.6°F | Resp 16 | Wt 188.4 lb

## 2022-11-01 DIAGNOSIS — D649 Anemia, unspecified: Secondary | ICD-10-CM | POA: Insufficient documentation

## 2022-11-01 DIAGNOSIS — Z7982 Long term (current) use of aspirin: Secondary | ICD-10-CM | POA: Insufficient documentation

## 2022-11-01 DIAGNOSIS — Z171 Estrogen receptor negative status [ER-]: Secondary | ICD-10-CM | POA: Insufficient documentation

## 2022-11-01 DIAGNOSIS — G629 Polyneuropathy, unspecified: Secondary | ICD-10-CM | POA: Insufficient documentation

## 2022-11-01 DIAGNOSIS — C50411 Malignant neoplasm of upper-outer quadrant of right female breast: Secondary | ICD-10-CM | POA: Diagnosis not present

## 2022-11-01 DIAGNOSIS — Z79899 Other long term (current) drug therapy: Secondary | ICD-10-CM | POA: Insufficient documentation

## 2022-11-01 DIAGNOSIS — Z87891 Personal history of nicotine dependence: Secondary | ICD-10-CM | POA: Diagnosis not present

## 2022-11-01 DIAGNOSIS — C50811 Malignant neoplasm of overlapping sites of right female breast: Secondary | ICD-10-CM | POA: Insufficient documentation

## 2022-11-01 DIAGNOSIS — Z5111 Encounter for antineoplastic chemotherapy: Secondary | ICD-10-CM | POA: Diagnosis present

## 2022-11-01 DIAGNOSIS — Z5112 Encounter for antineoplastic immunotherapy: Secondary | ICD-10-CM | POA: Insufficient documentation

## 2022-11-01 LAB — CBC WITH DIFFERENTIAL/PLATELET
Abs Immature Granulocytes: 0.04 10*3/uL (ref 0.00–0.07)
Basophils Absolute: 0 10*3/uL (ref 0.0–0.1)
Basophils Relative: 1 %
Eosinophils Absolute: 0.2 10*3/uL (ref 0.0–0.5)
Eosinophils Relative: 4 %
HCT: 32.2 % — ABNORMAL LOW (ref 36.0–46.0)
Hemoglobin: 9.9 g/dL — ABNORMAL LOW (ref 12.0–15.0)
Immature Granulocytes: 1 %
Lymphocytes Relative: 16 %
Lymphs Abs: 0.9 10*3/uL (ref 0.7–4.0)
MCH: 29 pg (ref 26.0–34.0)
MCHC: 30.7 g/dL (ref 30.0–36.0)
MCV: 94.4 fL (ref 80.0–100.0)
Monocytes Absolute: 0.5 10*3/uL (ref 0.1–1.0)
Monocytes Relative: 9 %
Neutro Abs: 3.8 10*3/uL (ref 1.7–7.7)
Neutrophils Relative %: 69 %
Platelets: 225 10*3/uL (ref 150–400)
RBC: 3.41 MIL/uL — ABNORMAL LOW (ref 3.87–5.11)
RDW: 14.6 % (ref 11.5–15.5)
WBC: 5.5 10*3/uL (ref 4.0–10.5)
nRBC: 0 % (ref 0.0–0.2)

## 2022-11-01 LAB — COMPREHENSIVE METABOLIC PANEL
ALT: 13 U/L (ref 0–44)
AST: 15 U/L (ref 15–41)
Albumin: 3.3 g/dL — ABNORMAL LOW (ref 3.5–5.0)
Alkaline Phosphatase: 80 U/L (ref 38–126)
Anion gap: 9 (ref 5–15)
BUN: 10 mg/dL (ref 8–23)
CO2: 24 mmol/L (ref 22–32)
Calcium: 9.2 mg/dL (ref 8.9–10.3)
Chloride: 105 mmol/L (ref 98–111)
Creatinine, Ser: 0.69 mg/dL (ref 0.44–1.00)
GFR, Estimated: >60 mL/min (ref >60)
Glucose, Bld: 116 mg/dL — ABNORMAL HIGH (ref 70–99)
Potassium: 3.9 mmol/L (ref 3.5–5.1)
Sodium: 138 mmol/L (ref 135–145)
Total Bilirubin: 0.4 mg/dL (ref 0.3–1.2)
Total Protein: 6.9 g/dL (ref 6.5–8.1)

## 2022-11-01 LAB — MAGNESIUM: Magnesium: 1.7 mg/dL (ref 1.7–2.4)

## 2022-11-01 MED ORDER — DOXORUBICIN HCL CHEMO IV INJECTION 2 MG/ML
60.0000 mg/m2 | Freq: Once | INTRAVENOUS | Status: AC
  Administered 2022-11-01: 122 mg via INTRAVENOUS
  Filled 2022-11-01: qty 61

## 2022-11-01 MED ORDER — DEXAMETHASONE SODIUM PHOSPHATE 10 MG/ML IJ SOLN
6.0000 mg | Freq: Once | INTRAMUSCULAR | Status: AC
  Administered 2022-11-01: 6 mg via INTRAVENOUS
  Filled 2022-11-01: qty 1

## 2022-11-01 MED ORDER — SODIUM CHLORIDE 0.9 % IV SOLN
600.0000 mg/m2 | Freq: Once | INTRAVENOUS | Status: AC
  Administered 2022-11-01: 1220 mg via INTRAVENOUS
  Filled 2022-11-01: qty 61

## 2022-11-01 MED ORDER — SODIUM CHLORIDE 0.9 % IV SOLN
200.0000 mg | Freq: Once | INTRAVENOUS | Status: AC
  Administered 2022-11-01: 200 mg via INTRAVENOUS
  Filled 2022-11-01: qty 8

## 2022-11-01 MED ORDER — SODIUM CHLORIDE 0.9 % IV SOLN
Freq: Once | INTRAVENOUS | Status: AC
  Filled 2022-11-01: qty 250

## 2022-11-01 MED ORDER — SODIUM CHLORIDE 0.9 % IV SOLN: 6.0000 mg | Freq: Once | INTRAVENOUS | Status: DC

## 2022-11-01 MED ORDER — SODIUM CHLORIDE 0.9 % IV SOLN
150.0000 mg | Freq: Once | INTRAVENOUS | Status: AC
  Administered 2022-11-01: 150 mg via INTRAVENOUS
  Filled 2022-11-01: qty 5
  Filled 2022-11-01: qty 150

## 2022-11-01 MED ORDER — HEPARIN SOD (PORK) LOCK FLUSH 100 UNIT/ML IV SOLN
500.0000 [IU] | Freq: Once | INTRAVENOUS | Status: AC | PRN
  Administered 2022-11-01: 500 [IU]
  Filled 2022-11-01: qty 5

## 2022-11-01 MED ORDER — PALONOSETRON HCL INJECTION 0.25 MG/5ML
0.2500 mg | Freq: Once | INTRAVENOUS | Status: AC
  Administered 2022-11-01: 0.25 mg via INTRAVENOUS
  Filled 2022-11-01: qty 5

## 2022-11-01 NOTE — Progress Notes (Signed)
[place]  [phone] [phone]  ID: Morgan Peterson OB: September 08, 1951  MR#: [id number]  AOZ#:[id number]  Patient Care Team: Steele Sizer, MD as PCP - General (Family Medicine) Bary Castilla, Forest Gleason, MD (General Surgery) Carloyn Manner, MD as Referring Physician (Otolaryngology) Yolonda Kida, MD as Consulting Physician (Cardiology) Ree Edman, MD as Referring Physician (Dermatology) Lonia Farber, MD as Consulting Physician (Internal Medicine) Cammie Sickle, MD as Consulting Physician (Internal Medicine)  I connected with Morgan Peterson on 11/01/22 at  9:30 AM EST by video enabled telemedicine visit and verified that I am speaking with the correct person using two identifiers.   I discussed the limitations, risks, security and privacy concerns of performing an evaluation and management service by telemedicine and the availability of in-person appointments. I also discussed with the patient that there may be a patient responsible charge related to this service. The patient expressed understanding and agreed to proceed.   Other persons participating in the visit and their role in the encounter: Patient, MD.  Patient's location: Clinic. Provider's location: Home.  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  INTERVAL HISTORY: Patient returns to clinic today for further evaluation and consideration of cycle 3 of doxorubicin, Cytoxan, and Keytruda.  She had significant abdominal pain after her last treatment which she contributes to her Neulasta injection.  Endoscopy only revealed a hiatal hernia without ulcer or significant pathology.  Currently, she feels well and is asymptomatic.  She has no neurologic complaints.  She denies any recent fevers or illnesses.  She has a good appetite and denies weight loss.  She has no chest pain, shortness of breath, cough, or hemoptysis.  She denies any nausea, vomiting,  constipation, or diarrhea.  She has no urinary complaints.  Patient offers no specific complaints today.  REVIEW OF SYSTEMS:   Review of Systems  Constitutional: Negative.  Negative for fever, malaise/fatigue and weight loss.  Respiratory: Negative.  Negative for cough, hemoptysis and shortness of breath.   Cardiovascular: Negative.  Negative for chest pain and leg swelling.  Gastrointestinal: Negative.  Negative for abdominal pain and nausea.  Genitourinary: Negative.  Negative for dysuria.  Musculoskeletal: Negative.  Negative for back pain.  Skin: Negative.  Negative for rash.  Neurological: Negative.  Negative for dizziness, focal weakness, weakness and headaches.  Psychiatric/Behavioral: Negative.  The patient is not nervous/anxious.     As per HPI. Otherwise, a complete review of systems is negative.  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 Arimidex therapy completed September 2015   Mild mitral valve prolapse    Neuropathy    feet, from chemo   Personal history of malignant neoplasm of breast 2010   Personal history of radiation therapy 2010   mammosite   T2DM (type 2 diabetes mellitus) ([place]) 2011    PAST SURGICAL HISTORY: Past Surgical History:  Procedure Laterality Date   ABDOMINAL EXPLORATION SURGERY  2000   ovarian cyst   BREAST BIOPSY Right 2010   +  BREAST EXCISIONAL BIOPSY Right 2010   + mammo site inasive mammo ca   BREAST L[facility]OMY Right 2010   [facility]   COLONOSCOPY  07/19/2012   Normal exam, Dr. Bary Castilla   COLONOSCOPY WITH  PROPOFOL N/A 04/21/2022   Procedure: COLONOSCOPY WITH PROPOFOL;  Surgeon: Robert Bellow, MD;  Location: [facility];  Service: Endoscopy;  Laterality: N/A;   ESOPHAGOGASTRODUODENOSCOPY (EGD) WITH PROPOFOL N/A 10/22/2022   Procedure: ESOPHAGOGASTRODUODENOSCOPY (EGD) WITH PROPOFOL;  Surgeon: Lucilla Lame, MD;  Location: [place];  Service: Endoscopy;  Laterality: N/A;  Diabetic   MASTECTOMY Right 2010   partial/ lumpectomy   PORTACATH PLACEMENT Left 05/19/2022   Procedure: INSERTION PORT-A-CATH;  Surgeon: Robert Bellow, MD;  Location: [facility] ORS;  Service: General;  Laterality: Left;   SIMPLE MASTECTOMY WITH AXILLARY SENTINEL NODE BIOPSY Right 04/26/2022   Procedure: SIMPLE MASTECTOMY WITH AXILLARY SENTINEL NODE BIOPSY;  Surgeon: Robert Bellow, MD;  Location: [facility] ORS;  Service: General;  Laterality: Right;  RNFA to assist   TUBAL LIGATION  20 years ago    FAMILY HISTORY: Family History  Problem Relation Age of Onset   Osteoporosis Mother    Diabetes Father    Kidney disease Father    Diabetes Brother    Breast cancer Maternal Aunt    Bladder Cancer Maternal Aunt    Cervical cancer Maternal Aunt    Colon cancer Maternal Uncle    Lung cancer Maternal Uncle     ADVANCED DIRECTIVES (Y/N):  N  HEALTH MAINTENANCE: Social History   Tobacco Use   Smoking status: Former    Packs/day: 0.25    Years: 10.00    Total pack years: 2.50    Types: Cigarettes    Start date: 23    Quit date: 1988    Years since quitting: 35.8   Smokeless tobacco: Never   Tobacco comments:    smoking cessation materials not required  Vaping Use   Vaping Use: Never used  Substance Use Topics   Alcohol use: Yes    Comment: wine occ   Drug use: No     Colonoscopy:  PAP:  Bone density:  Lipid panel:  Allergies  Allergen Reactions   Levaquin [Levofloxacin In D5w] Swelling    Other reaction(s): Arthralgia (Joint Pain)   Losartan     Other reaction(s): Angioedema   Metoprolol  Swelling    Lip and tongue tingling and numbness   Saxagliptin Other (See Comments)    Increased PVC's Other reaction(s): irregular heart rate   Amlodipine-Olmesartan    Canagliflozin Other (See Comments)    Bladder pain   Egg White [Egg White (Egg Protein)]     Unknown reaction   Gabapentin     cns side effects.   Glipizide     Other reaction(s): Abdominal pain   Gluten Meal    Lisinopril Swelling    Angioedema   Metformin And Related Nausea Only   Milk (Cow)    Milk-Related Compounds    Shrimp Extract Allergy Skin Test     Other reaction(s): Other (See Comments)   Tramadol Nausea And Vomiting   Zyprexa [Olanzapine]    Actos [Pioglitazone] Other (See Comments) and Nausea And Vomiting    Bladder pain   Atorvastatin Other (See Comments)    Other reaction(s): Abdominal Pain, Other (See Comments)   Carvedilol Other (See Comments)    NUMBNESS, TINGLING, ACHING IN ARMS   Prednisone Palpitations    Current Outpatient Medications  Medication Sig Dispense Refill  aspirin EC 81 MG tablet Take 1 tablet (81 mg total) by mouth daily. 30 tablet 0   atenolol (TENORMIN) 25 MG tablet Take 1 tablet (25 mg total) by mouth daily. 90 tablet 0   Blood Glucose Monitoring Suppl (ONETOUCH VERIO) w/Device KIT 1 Device by Does not apply route once. 1 kit 0   cholecalciferol (VITAMIN D) 1000 UNITS tablet Take 1,000 Units by mouth daily.     Cyanocobalamin (VITAMIN B-12 PO) Take 1 tablet by mouth daily as needed (fatigue).     famotidine (PEPCID) 20 MG tablet Take 20 mg by mouth 2 (two) times daily.     glucose blood (ONETOUCH VERIO) test strip Use as instructed 100 each 12   Lancets (ONETOUCH ULTRASOFT) lancets Use as instructed 100 each 3   levocetirizine (XYZAL) 5 MG tablet Take 1 tablet (5 mg total) by mouth every evening. 90 tablet 1   lidocaine-prilocaine (EMLA) cream Apply 1 application. topically as needed. Apply to port and cover with saran wrap 1-2 hours prior to port access 30 g 1    montelukast (SINGULAIR) 10 MG tablet TAKE 1 TABLET BY MOUTH EVERYDAY AT BEDTIME 90 tablet 1   Multiple Vitamin (MULTIVITAMIN WITH MINERALS) TABS tablet Take 1 tablet by mouth 2 (two) times a week.     ondansetron (ZOFRAN) 8 MG tablet Take 1 tablet (8 mg total) by mouth every 8 (eight) hours as needed for nausea or vomiting. 40 tablet 1   OZEMPIC, 0.25 OR 0.5 MG/DOSE, 2 MG/1.5ML SOPN Inject 0.5 mg into the skin every Sunday.     pantoprazole (PROTONIX) 40 MG tablet Take 1 tablet (40 mg total) by mouth daily. 14 tablet 0   promethazine (PHENERGAN) 25 MG tablet Take 1 tablet (25 mg total) by mouth every 6 (six) hours as needed for nausea or vomiting. 40 tablet 0   rosuvastatin (CRESTOR) 10 MG tablet Take 1 tablet (10 mg total) by mouth every morning. 90 tablet 1   spironolactone (ALDACTONE) 50 MG tablet Take 1 tablet (50 mg total) by mouth daily. 90 tablet 0   azelastine (ASTELIN) 0.1 % nasal spray Place 2 sprays into both nostrils daily as needed for allergies or rhinitis. Use in each nostril as directed (Patient not taking: Reported on 11/01/2022)     betamethasone, augmented, (DIPROLENE) 0.05 % lotion Apply 1 application topically 2 (two) times daily. (Patient not taking: Reported on 10/12/2022)     dronabinol (MARINOL) 2.5 MG capsule Take 1 capsule (2.5 mg total) by mouth daily before lunch. (Patient not taking: Reported on 11/01/2022) 60 capsule 0   EPINEPHrine (EPIPEN 2-PAK) 0.3 mg/0.3 mL IJ SOAJ injection Inject 0.3 mg into the muscle as needed for anaphylaxis. (Patient not taking: Reported on 11/01/2022) 2 each 0   magnesium oxide (MAG-OX) 400 (240 Mg) MG tablet Take 1 tablet (400 mg total) by mouth daily. (Patient not taking: Reported on 11/01/2022) 15 tablet 0   rizatriptan (MAXALT-MLT) 10 MG disintegrating tablet Take 5-10 mg by mouth as needed for migraine. May repeat in 2 hours if needed (Patient not taking: Reported on 11/01/2022)     No current facility-administered medications for this  visit.    OBJECTIVE: Vitals:   11/01/22 0949  BP: 134/71  Pulse: 72  Resp: 16  Temp: 97.6 F (36.4 C)  SpO2: 100%     Body mass index is 30.41 kg/m.    ECOG FS:0 - Asymptomatic  General: Well-developed, well-nourished, no acute distress. HEENT: Normocephalic. Neuro: Alert, answering all questions  appropriately. Cranial nerves grossly intact. Psych: Normal affect.   LAB RESULTS:  Lab Results  Component Value Date   NA 141 10/14/2022   K 3.7 10/14/2022   CL 111 10/14/2022   CO2 25 10/14/2022   GLUCOSE 168 (H) 10/14/2022   BUN 14 10/14/2022   CREATININE 0.85 10/14/2022   CALCIUM 8.7 (L) 10/14/2022   PROT 6.8 10/14/2022   ALBUMIN 3.4 (L) 10/14/2022   AST 22 10/14/2022   ALT 17 10/14/2022   ALKPHOS 64 10/14/2022   BILITOT 0.1 (L) 10/14/2022   GFRNONAA >60 10/14/2022   GFRAA 85 10/09/2019    Lab Results  Component Value Date   WBC 5.5 11/01/2022   NEUTROABS 3.8 11/01/2022   HGB 9.9 (L) 11/01/2022   HCT 32.2 (L) 11/01/2022   MCV 94.4 11/01/2022   PLT 225 11/01/2022     STUDIES: No results found.  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  She also previously received weekly carboplatin and Taxol.  Proceed with what appears to be cycle 3 of Adriamycin, Cytoxan, and Keytruda.  Her last treatment was delayed and she has not received chemotherapy since September 21, 2022.  Patient states that she had significant abdominal pain secondary to her Udenyca injection after her most recent treatment and is declining her injection tomorrow.  She expressed understanding of the increased risk of infection.  Return to clinic in 3 weeks for further evaluation and consideration of cycle 4. Anemia: Hemoglobin mildly improved to 9.9, monitor. Neutropenia: Resolved.  Patient declining Udenyca as above. Epigastric pain/melena: Recent endoscopy did not reveal any significant  pathology. Peripheral neuropathy: Chronic and unchanged.  Patient does not complain of this today.  Patient expressed understanding and was in agreement with this plan. She also understands that She can call clinic at any time with any questions, concerns, or complaints.    Cancer Staging  Breast cancer [facility]) Staging form: Breast, AJCC 8th Edition - Pathologic stage from 05/10/2022: Stage IIA (pT1c, pN1a, cM0, G3, ER-, PR+, HER2-) - Signed by Earlie Server, MD on 05/10/2022 Stage prefix: Initial diagnosis Histologic grading system: 3 grade system  Carcinoma of upper-outer quadrant of right breast in female, estrogen receptor negative ([place]) Staging form: Breast, AJCC 8th Edition - Pathologic: Stage IIA (pT1c, pN1, cM0, G3, ER-, PR-, HER2-) - Signed by Cammie Sickle, MD on 05/26/2022 Multigene prognostic tests performed: None Histologic grading system: 3 grade system   Lloyd Huger, MD   11/01/2022 10:23 AM

## 2022-11-01 NOTE — Progress Notes (Signed)
Pt has a gnawing sensation still in her abd that goes away once she eats. Eating has recently caused gas as well.

## 2022-11-01 NOTE — Patient Instructions (Signed)
[facility]  Discharge Instructions: Thank you for choosing [facility] to provide your oncology and hematology care.  If you have a lab appointment with the Cancer Center, please go directly to the Cancer Center and check in at the registration area.  Wear comfortable clothing and clothing appropriate for easy access to any Portacath or PICC line.   We strive to give you quality time with your provider. You may need to reschedule your appointment if you arrive late (15 or more minutes).  Arriving late affects you and other patients whose appointments are after yours.  Also, if you miss three or more appointments without notifying the office, you may be dismissed from the clinic at the provider's discretion.      For prescription refill requests, have your pharmacy contact our office and allow 72 hours for refills to be completed.       To help prevent nausea and vomiting after your treatment, we encourage you to take your nausea medication as directed.  BELOW ARE SYMPTOMS THAT SHOULD BE REPORTED IMMEDIATELY: *FEVER GREATER THAN 100.4 F (38 C) OR HIGHER *CHILLS OR SWEATING *NAUSEA AND VOMITING THAT IS NOT CONTROLLED WITH YOUR NAUSEA MEDICATION *UNUSUAL SHORTNESS OF BREATH *UNUSUAL BRUISING OR BLEEDING *URINARY PROBLEMS (pain or burning when urinating, or frequent urination) *BOWEL PROBLEMS (unusual diarrhea, constipation, pain near the anus) TENDERNESS IN MOUTH AND THROAT WITH OR WITHOUT PRESENCE OF ULCERS (sore throat, sores in mouth, or a toothache) UNUSUAL RASH, SWELLING OR PAIN  UNUSUAL VAGINAL DISCHARGE OR ITCHING   Items with * indicate a potential emergency and should be followed up as soon as possible or go to the Emergency Department if any problems should occur.  Please show the CHEMOTHERAPY ALERT CARD or IMMUNOTHERAPY ALERT CARD at check-in to the Emergency Department and triage nurse.  Should you have questions after your  visit or need to cancel or reschedule your appointment, please contact [facility]  [phone] and follow the prompts.  Office hours are 8:00 a.m. to 4:30 p.m. Monday - Friday. Please note that voicemails left after 4:00 p.m. may not be returned until the following business day.  We are closed weekends and major holidays. You have access to a nurse at all times for urgent questions. Please call the main number to the clinic [phone] and follow the prompts.  For any non-urgent questions, you may also contact your provider using MyChart. We now offer e-Visits for anyone 18 and older to request care online for non-urgent symptoms. For details visit [email or web address].   Also download the MyChart app! Go to the app store, search "MyChart", open the app, select [facility], and log in with your MyChart username and password.  Masks are optional in the cancer centers. If you would like for your care team to wear a mask while they are taking care of you, please let them know. For doctor visits, patients may have with them one support person who is at least 71 years old. At this time, visitors are not allowed in the infusion area.   

## 2022-11-02 ENCOUNTER — Inpatient Hospital Stay: Payer: Medicare Other

## 2022-11-04 ENCOUNTER — Telehealth: Payer: Self-pay | Admitting: Gastroenterology

## 2022-11-04 ENCOUNTER — Telehealth: Payer: Self-pay | Admitting: *Deleted

## 2022-11-04 NOTE — Telephone Encounter (Signed)
I spoke to pt and she reports that she is having ongoing epigastric burning pain and is still taking Protonix and Pepcid BID....   Pt states she read her letter with the pathology results and wanted to know if she needed a different medication, and what needs to be done regarding the inflammation noted from the biopsy    Please advise

## 2022-11-04 NOTE — Telephone Encounter (Signed)
Patient called reporting that she had a virtual Visit with Dr Grayland Ormond and is asking if she needs to come in nest week to have her labs checked. She aklso reportas that per her Endoscopy she had some irritation in her stomach and is asking if she needs to be on medicine for that since hse is still having irritation in her stomach. She mentioned that she did not get her injection after her last chemotherapy

## 2022-11-04 NOTE — Telephone Encounter (Signed)
Pt called stating that she is still having persistant stomach pain she is wondering what she should do next.

## 2022-11-04 NOTE — Telephone Encounter (Signed)
Call returned to patient and advised that she should contact Dr Allen Norris regarding need for medication for her stomach. She was in agreement to contact him about this

## 2022-11-05 ENCOUNTER — Other Ambulatory Visit: Payer: Self-pay | Admitting: *Deleted

## 2022-11-05 DIAGNOSIS — Z171 Estrogen receptor negative status [ER-]: Secondary | ICD-10-CM

## 2022-11-06 ENCOUNTER — Other Ambulatory Visit: Payer: Self-pay | Admitting: Family Medicine

## 2022-11-06 DIAGNOSIS — L509 Urticaria, unspecified: Secondary | ICD-10-CM

## 2022-11-08 ENCOUNTER — Inpatient Hospital Stay: Payer: Medicare Other

## 2022-11-08 DIAGNOSIS — Z171 Estrogen receptor negative status [ER-]: Secondary | ICD-10-CM

## 2022-11-08 DIAGNOSIS — Z5112 Encounter for antineoplastic immunotherapy: Secondary | ICD-10-CM | POA: Diagnosis not present

## 2022-11-08 DIAGNOSIS — Z95828 Presence of other vascular implants and grafts: Secondary | ICD-10-CM

## 2022-11-08 LAB — COMPREHENSIVE METABOLIC PANEL
ALT: 11 U/L (ref 0–44)
AST: 17 U/L (ref 15–41)
Albumin: 3.4 g/dL — ABNORMAL LOW (ref 3.5–5.0)
Alkaline Phosphatase: 66 U/L (ref 38–126)
Anion gap: 6 (ref 5–15)
BUN: 17 mg/dL (ref 8–23)
CO2: 25 mmol/L (ref 22–32)
Calcium: 9.3 mg/dL (ref 8.9–10.3)
Chloride: 105 mmol/L (ref 98–111)
Creatinine, Ser: 0.79 mg/dL (ref 0.44–1.00)
GFR, Estimated: >60 mL/min (ref >60)
Glucose, Bld: 150 mg/dL — ABNORMAL HIGH (ref 70–99)
Potassium: 4.2 mmol/L (ref 3.5–5.1)
Sodium: 136 mmol/L (ref 135–145)
Total Bilirubin: 0.5 mg/dL (ref 0.3–1.2)
Total Protein: 7.2 g/dL (ref 6.5–8.1)

## 2022-11-08 LAB — CBC WITH DIFFERENTIAL/PLATELET
Abs Immature Granulocytes: 0.05 10*3/uL (ref 0.00–0.07)
Basophils Absolute: 0 10*3/uL (ref 0.0–0.1)
Basophils Relative: 2 %
Eosinophils Absolute: 0 10*3/uL (ref 0.0–0.5)
Eosinophils Relative: 2 %
HCT: 31.9 % — ABNORMAL LOW (ref 36.0–46.0)
Hemoglobin: 10 g/dL — ABNORMAL LOW (ref 12.0–15.0)
Immature Granulocytes: 3 %
Lymphocytes Relative: 18 %
Lymphs Abs: 0.3 10*3/uL — ABNORMAL LOW (ref 0.7–4.0)
MCH: 28.8 pg (ref 26.0–34.0)
MCHC: 31.3 g/dL (ref 30.0–36.0)
MCV: 91.9 fL (ref 80.0–100.0)
Monocytes Absolute: 0 10*3/uL — ABNORMAL LOW (ref 0.1–1.0)
Monocytes Relative: 1 %
Neutro Abs: 1.4 10*3/uL — ABNORMAL LOW (ref 1.7–7.7)
Neutrophils Relative %: 74 %
Platelets: 125 10*3/uL — ABNORMAL LOW (ref 150–400)
RBC: 3.47 MIL/uL — ABNORMAL LOW (ref 3.87–5.11)
RDW: 13.8 % (ref 11.5–15.5)
WBC: 1.8 10*3/uL — ABNORMAL LOW (ref 4.0–10.5)
nRBC: 0 % (ref 0.0–0.2)

## 2022-11-08 MED ORDER — SODIUM CHLORIDE 0.9% FLUSH
10.0000 mL | Freq: Once | INTRAVENOUS | Status: AC
  Administered 2022-11-08: 10 mL via INTRAVENOUS
  Filled 2022-11-08: qty 10

## 2022-11-08 MED ORDER — HEPARIN SOD (PORK) LOCK FLUSH 100 UNIT/ML IV SOLN
500.0000 [IU] | Freq: Once | INTRAVENOUS | Status: AC
  Administered 2022-11-08: 500 [IU] via INTRAVENOUS
  Filled 2022-11-08: qty 5

## 2022-11-09 NOTE — Telephone Encounter (Signed)
Left message on voicemail.

## 2022-11-19 MED FILL — Fosaprepitant Dimeglumine For IV Infusion 150 MG (Base Eq): INTRAVENOUS | Qty: 5 | Status: AC

## 2022-11-22 ENCOUNTER — Other Ambulatory Visit: Payer: Self-pay | Admitting: Family Medicine

## 2022-11-22 ENCOUNTER — Inpatient Hospital Stay: Payer: Medicare Other

## 2022-11-22 ENCOUNTER — Inpatient Hospital Stay: Payer: Medicare Other | Attending: Oncology | Admitting: Internal Medicine

## 2022-11-22 VITALS — BP 117/80 | HR 80 | Temp 97.7°F | Resp 89 | Ht 66.0 in | Wt 183.8 lb

## 2022-11-22 DIAGNOSIS — I10 Essential (primary) hypertension: Secondary | ICD-10-CM

## 2022-11-22 DIAGNOSIS — Z5189 Encounter for other specified aftercare: Secondary | ICD-10-CM | POA: Diagnosis not present

## 2022-11-22 DIAGNOSIS — C50811 Malignant neoplasm of overlapping sites of right female breast: Secondary | ICD-10-CM

## 2022-11-22 DIAGNOSIS — Z87891 Personal history of nicotine dependence: Secondary | ICD-10-CM | POA: Insufficient documentation

## 2022-11-22 DIAGNOSIS — Z923 Personal history of irradiation: Secondary | ICD-10-CM | POA: Insufficient documentation

## 2022-11-22 DIAGNOSIS — Z5111 Encounter for antineoplastic chemotherapy: Secondary | ICD-10-CM | POA: Diagnosis present

## 2022-11-22 DIAGNOSIS — Z801 Family history of malignant neoplasm of trachea, bronchus and lung: Secondary | ICD-10-CM | POA: Insufficient documentation

## 2022-11-22 DIAGNOSIS — Z9011 Acquired absence of right breast and nipple: Secondary | ICD-10-CM | POA: Diagnosis not present

## 2022-11-22 DIAGNOSIS — Z79899 Other long term (current) drug therapy: Secondary | ICD-10-CM | POA: Diagnosis not present

## 2022-11-22 DIAGNOSIS — C50411 Malignant neoplasm of upper-outer quadrant of right female breast: Secondary | ICD-10-CM | POA: Diagnosis not present

## 2022-11-22 DIAGNOSIS — Z171 Estrogen receptor negative status [ER-]: Secondary | ICD-10-CM | POA: Insufficient documentation

## 2022-11-22 DIAGNOSIS — Z800 Family history of malignant neoplasm of digestive organs: Secondary | ICD-10-CM | POA: Insufficient documentation

## 2022-11-22 DIAGNOSIS — D709 Neutropenia, unspecified: Secondary | ICD-10-CM | POA: Diagnosis not present

## 2022-11-22 DIAGNOSIS — Z5112 Encounter for antineoplastic immunotherapy: Secondary | ICD-10-CM | POA: Insufficient documentation

## 2022-11-22 DIAGNOSIS — Z803 Family history of malignant neoplasm of breast: Secondary | ICD-10-CM | POA: Diagnosis not present

## 2022-11-22 DIAGNOSIS — E119 Type 2 diabetes mellitus without complications: Secondary | ICD-10-CM | POA: Insufficient documentation

## 2022-11-22 DIAGNOSIS — Z7982 Long term (current) use of aspirin: Secondary | ICD-10-CM | POA: Diagnosis not present

## 2022-11-22 DIAGNOSIS — Z95828 Presence of other vascular implants and grafts: Secondary | ICD-10-CM

## 2022-11-22 LAB — COMPREHENSIVE METABOLIC PANEL
ALT: 17 U/L (ref 0–44)
AST: 20 U/L (ref 15–41)
Albumin: 3.4 g/dL — ABNORMAL LOW (ref 3.5–5.0)
Alkaline Phosphatase: 73 U/L (ref 38–126)
Anion gap: 9 (ref 5–15)
BUN: 7 mg/dL — ABNORMAL LOW (ref 8–23)
CO2: 25 mmol/L (ref 22–32)
Calcium: 8.7 mg/dL — ABNORMAL LOW (ref 8.9–10.3)
Chloride: 107 mmol/L (ref 98–111)
Creatinine, Ser: 0.8 mg/dL (ref 0.44–1.00)
GFR, Estimated: >60 mL/min (ref >60)
Glucose, Bld: 109 mg/dL — ABNORMAL HIGH (ref 70–99)
Potassium: 3.7 mmol/L (ref 3.5–5.1)
Sodium: 141 mmol/L (ref 135–145)
Total Bilirubin: 0.4 mg/dL (ref 0.3–1.2)
Total Protein: 6.8 g/dL (ref 6.5–8.1)

## 2022-11-22 LAB — CBC WITH DIFFERENTIAL/PLATELET
Abs Immature Granulocytes: 0.01 10*3/uL (ref 0.00–0.07)
Basophils Absolute: 0 10*3/uL (ref 0.0–0.1)
Basophils Relative: 1 %
Eosinophils Absolute: 0 10*3/uL (ref 0.0–0.5)
Eosinophils Relative: 0 %
HCT: 29.3 % — ABNORMAL LOW (ref 36.0–46.0)
Hemoglobin: 9.2 g/dL — ABNORMAL LOW (ref 12.0–15.0)
Immature Granulocytes: 1 %
Lymphocytes Relative: 29 %
Lymphs Abs: 0.5 10*3/uL — ABNORMAL LOW (ref 0.7–4.0)
MCH: 28.7 pg (ref 26.0–34.0)
MCHC: 31.4 g/dL (ref 30.0–36.0)
MCV: 91.3 fL (ref 80.0–100.0)
Monocytes Absolute: 0.3 10*3/uL (ref 0.1–1.0)
Monocytes Relative: 20 %
Neutro Abs: 0.8 10*3/uL — ABNORMAL LOW (ref 1.7–7.7)
Neutrophils Relative %: 49 %
Platelets: 131 10*3/uL — ABNORMAL LOW (ref 150–400)
RBC: 3.21 MIL/uL — ABNORMAL LOW (ref 3.87–5.11)
RDW: 14.2 % (ref 11.5–15.5)
WBC: 1.6 10*3/uL — ABNORMAL LOW (ref 4.0–10.5)
nRBC: 1.2 % — ABNORMAL HIGH (ref 0.0–0.2)

## 2022-11-22 MED ORDER — HEPARIN SOD (PORK) LOCK FLUSH 100 UNIT/ML IV SOLN
500.0000 [IU] | Freq: Once | INTRAVENOUS | Status: AC
  Filled 2022-11-22: qty 5

## 2022-11-22 NOTE — Assessment & Plan Note (Addendum)
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  Patient currently on adjuvant chemoimmunotherapy [[id number] study]; patient s/p carboplatin weekly Taxol.  Currently on Adriamycin-Cytoxan Keytruda-s/p 3 cycles.  # HOLD cycle #4 Adriamycin Cytoxan Keytruda Labs today reviewed;  UNacceptable for treatment - ANC 0.8 today. June 6th- ECHO- 05/25/2022- 65-70%. TSH AUG 2023-WNL.  Re-schedule to 1 week from now.   # Abdominal/epigastric pain status post EGD [NOV 2023- no ulcers; Dr.Wohl]- peptobismol prn-STABLE.   # Nausea- no vomiting- needing IVFs in clinic.  Encourage antiemetics.  Monitor for now.  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   # Hypomagnesemia magnesium NOV 2023- 1.7 continue mag oxide to twice a day.  # DM-we will cut down the steroids to 6 mg.  Blood sugars today 116 [Orange juice]- on ozempic.  Monitor blood sugars closely. STABLE.   # DISPOSITION: # HOLD chemo today; DE-access # Follow up MD- in 1week/Tuesday; port/ labs- cbc/cmp;mag Adriamycin-Cytoxan--keytruda; D-2Ellen Henri- Dr.B

## 2022-11-22 NOTE — Progress Notes (Signed)
[facility] CONSULT NOTE  Patient Care Team: Sowles, [name], MD as PCP - General (Family Medicine) [name], Jeffrey W, MD (General Surgery) Vaught, Creighton, MD as Referring Physician (Otolaryngology) Callwood, Dwayne D, MD as Consulting Physician (Cardiology) Benitez-Graham, Ana, MD as Referring Physician (Dermatology) O'Connell, Thomas L, MD as Consulting Physician (Internal Medicine) Teruko Joswick R, MD as Consulting Physician (Internal Medicine)  CHIEF COMPLAINTS/PURPOSE OF CONSULTATION: Breast cancer  #  Oncology History Overview Note  She has a remote history of right breast cancer, T1bN0, ER positive,s/p lumpectomy and MammoSite radiation and 5 years of Arimidex.    02/19/22 screening mammogram bilaterally showed  1. Suspicious right breast mass at the 10 o'clock position 8 cm from the nipple. Recommend ultrasound-guided biopsy. 2. Indeterminate right breast mass at the 6 o'clock position 4 cm from the nipple. Recommend ultrasound-guided biopsy. 3. No suspicious right axillary lymphadenopathy.   04/12/2022 Unilateral right breast diagnostic mammogram showed 1. Suspicious right breast mass at the 10 o'clock position 8 cm from the nipple. Recommend ultrasound-guided biopsy. 2. Indeterminate right breast mass at the 6 o'clock position 4 cm from the nipple. Recommend ultrasound-guided biopsy. 3. No suspicious right axillary lymphadenopathy.   04/13/2022 right breast mass 6 o'clock position 1 cm from the nipple showed invasive mammary carcinoma, no special type. Grade 3, DCIS present high grade, LVI not identified. ER-, PR 1-10%, HER 2 -   04/13/2022, right breast mass 10:00 7 cm from nipple biopsy showed invasive mammary carcinoma, grade 3, high-grade DCIS with focal comedonecrosis, lymphovascular invasion not identified.  ER -, PR 1 to 10%, HER2-    04/26/2022, right breast mastectomy with axillary dissection Invasive mammary carcinoma, no special type, multifocal, DCIS  high-grade, benign nipple/areola.  2 deposits of invasive mammary carcinoma, no definite residual lymph node identified.   Breast cancer (HCC)  05/10/2022 Initial Diagnosis   Breast cancer (HCC)   05/10/2022 Cancer Staging   Staging form: Breast, AJCC 8th Edition - Pathologic stage from 05/10/2022: Stage IIA (pT1c, pN1a, cM0, G3, ER-, PR+, HER2-) - Signed by Yu, Zhou, MD on 05/10/2022 Stage prefix: Initial diagnosis Histologic grading system: 3 grade system   05/24/2022 - 05/24/2022 Chemotherapy   Patient is on Treatment Plan : BREAST Pembrolizumab (200) D1 + Carboplatin (5) D1 + Paclitaxel (80) D1,8,15 q21d X 4 cycles / Pembrolizumab (200) D1 + AC D1 q21d x 4 cycles     05/27/2022 - 08/13/2022 Chemotherapy   Patient is on Treatment Plan : BREAST Pembrolizumab (200) D1 + Carboplatin (5) D1 + Paclitaxel (80) D1,8,15 q21d X 4 cycles / Pembrolizumab (200) D1 + AC D1 q21d x 4 cycles     05/27/2022 -  Chemotherapy   Patient is on Treatment Plan : BREAST Pembrolizumab (200) D1 + Carboplatin (5) D1 + Paclitaxel (80) D1,8,15 q21d X 4 cycles / Pembrolizumab (200) D1 + AC D1 q21d x 4 cycles     Carcinoma of upper-outer quadrant of right breast in female, estrogen receptor negative (HCC)  05/26/2022 Initial Diagnosis   Carcinoma of upper-outer quadrant of right breast in female, estrogen receptor negative (HCC)   05/26/2022 Cancer Staging   Staging form: Breast, AJCC 8th Edition - Pathologic: Stage IIA (pT1c, pN1, cM0, G3, ER-, PR-, HER2-) - Signed by Tiffane Sheldon R, MD on 05/26/2022 Multigene prognostic tests performed: None Histologic grading system: 3 grade system   05/27/2022 -  Chemotherapy   Patient is on Treatment Plan : BREAST Pembrolizumab (200) D1 + Carboplatin (5) D1 + Paclitaxel (80)   D1,8,15 q21d X 4 cycles / Pembrolizumab (200) D1 + AC D1 q21d x 4 cycles       HISTORY OF PRESENTING ILLNESS: Ambulating independently.  Accompanied by husband.  [name] 71 y.o.  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  Patient is currently status post cycle #3 of Adriamycin Cytoxan-Keytruda.   In the interim patient's chemo therapy was interrupted because of neutropenia; and also abdominal pain/concern for melena.  Patient needed EGD.  Patient denies new problems/concerns today. Patient continues to have neuropathy in feel and hands stable, not improving.  She is not on gabapentin.  Appetite is improving with Marinol.    Review of Systems  Constitutional:  Positive for malaise/fatigue. Negative for chills, diaphoresis, fever and weight loss.  HENT:  Negative for nosebleeds and sore throat.   Eyes:  Negative for double vision.  Respiratory:  Negative for cough, hemoptysis, sputum production, shortness of breath and wheezing.   Cardiovascular:  Negative for chest pain, palpitations, orthopnea and leg swelling.  Gastrointestinal:  Negative for abdominal pain, blood in stool, constipation, diarrhea, heartburn, melena, nausea and vomiting.  Genitourinary:  Negative for dysuria, frequency and urgency.  Musculoskeletal:  Negative for back pain and joint pain.  Skin: Negative.  Negative for itching and rash.  Neurological:  Positive for tingling. Negative for dizziness, focal weakness, weakness and headaches.  Endo/Heme/Allergies:  Does not bruise/bleed easily.  Psychiatric/Behavioral:  Negative for depression. The patient is not nervous/anxious and does not have insomnia.      MEDICAL HISTORY:  Past Medical History:  Diagnosis Date   Appetite loss    Asthma    Cervical radiculopathy    Chemotherapy-induced nausea    Hiatal hernia    Hypercholesterolemia    Hyperlipidemia    Hypertension    IBS (irritable bowel syndrome)    Malignant neoplasm of upper-outer quadrant of female breast ([place]) 04/11/2009   Right breast, invasive ductal carcinoma,  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 Arimidex therapy completed September 2015   Mild mitral valve prolapse    Neuropathy    feet, from chemo   Personal history of malignant neoplasm of breast 2010   Personal history of radiation therapy 2010   mammosite   T2DM (type 2 diabetes mellitus) ([place]) 2011    SURGICAL HISTORY: Past Surgical History:  Procedure Laterality Date   ABDOMINAL EXPLORATION SURGERY  2000   ovarian cyst   BREAST BIOPSY Right 2010   +    BREAST EXCISIONAL BIOPSY Right 2010   + mammo site inasive mammo ca   BREAST LUMPECTOMY Right 2010   [facility]   COLONOSCOPY  07/19/2012   Normal exam, Dr. Bary Castilla   COLONOSCOPY WITH PROPOFOL N/A 04/21/2022   Procedure: COLONOSCOPY WITH PROPOFOL;  Surgeon: Robert Bellow, MD;  Location: [facility];  Service: Endoscopy;  Laterality: N/A;   ESOPHAGOGASTRODUODENOSCOPY (EGD) WITH PROPOFOL N/A 10/22/2022   Procedure: ESOPHAGOGASTRODUODENOSCOPY (EGD) WITH PROPOFOL;  Surgeon: Lucilla Lame, MD;  Location: [place];  Service: Endoscopy;  Laterality: N/A;  Diabetic   MASTECTOMY Right 2010   partial/ lumpectomy   PORTACATH PLACEMENT Left 05/19/2022   Procedure: INSERTION PORT-A-CATH;  Surgeon: Robert Bellow, MD;  Location: [facility] OR[facility];  Service: General;  Laterality: Left;   SIMPLE MASTECTOMY WITH AXILLARY SENTINEL NODE BIOPSY Right 04/26/2022   Procedure: SIMPLE  MASTECTOMY WITH AXILLARY SENTINEL NODE BIOPSY;  Surgeon: Robert Bellow, MD;  Location: [facility] OR[facility];  Service: General;  Laterality: Right;  RNFA to assist   TUBAL LIGATION  20 years ago    SOCIAL HISTORY: Social History   Socioeconomic History   Marital status: Married    Spouse name: Antonio   Number of children: 2   Years of education: Not on file   Highest education level: Not on file  Occupational History   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  Tobacco Use   Smoking status: Former     Packs/day: 0.25    Years: 10.00    Total pack years: 2.50    Types: Cigarettes    Start date: 1978    Quit date: 1988    Years since quitting: 35.9   Smokeless tobacco: Never   Tobacco comments:    smoking cessation materials not required  Vaping Use   Vaping Use: Never used  Substance and Sexual Activity   Alcohol use: Yes    Comment: wine occ   Drug use: No   Sexual activity: Not Currently    Comment: husband has ED  Other Topics Concern   Not on file  Social History Narrative   Oncology nurse on the floor retired.;  Husband retired from [employer].  No smoking.   Social Determinants of Health   Financial Resource Strain: Low Risk  (12/31/2021)   Overall Financial Resource Strain (CARDIA)    Difficulty of Paying Living Expenses: Not hard at all  Food Insecurity: No Food Insecurity (12/31/2021)   Hunger Vital Sign    Worried About Running Out of Food in the Last Year: Never true    Ran Out of Food in the Last Year: Never true  Transportation Needs: No Transportation Needs (12/31/2021)   PRAPARE - Hydrologist (Medical): No    Lack of Transportation (Non-Medical): No  Physical Activity: Inactive (12/31/2021)   Exercise Vital Sign    Days of Exercise per Week: 0 days    Minutes of Exercise per Session: 0 min  Stress: No Stress Concern Present (12/31/2021)   [place]    Feeling of Stress : Only a little  Social Connections: Moderately Integrated (12/31/2021)   Social Connection and Isolation Panel [NHANES]    Frequency of Communication with Friends and Family: More than three times a week    Frequency of Social Gatherings with Friends and Family: Three times a week    Attends Religious Services: More than 4 times per year    Active Member of Clubs or Organizations: No    Attends Archivist Meetings: Never    Marital Status: Married  Human resources officer Violence: Not At Risk  (12/31/2021)   Humiliation, Afraid, Rape, and Kick questionnaire    Fear of Current or Ex-Partner: No    Emotionally Abused: No    Physically Abused: No    Sexually Abused: No    FAMILY HISTORY: Family History  Problem Relation Age of Onset   Osteoporosis Mother    Diabetes Father    Kidney disease Father    Diabetes Brother    Breast cancer Maternal Aunt    Bladder Cancer Maternal Aunt    Cervical cancer Maternal Aunt    Colon cancer Maternal Uncle    Lung cancer Maternal Uncle     ALLERGIES:  is allergic to levaquin [levofloxacin in d5w], losartan, metoprolol,  saxagliptin, amlodipine-olmesartan, canagliflozin, egg white [egg white (egg protein)], gabapentin, glipizide, gluten meal, lisinopril, metformin and related, milk (cow), milk-related compounds, shrimp extract allergy skin test, tramadol, zyprexa [olanzapine], actos [pioglitazone], atorvastatin, carvedilol, and prednisone.  MEDICATIONS:  Current Outpatient Medications  Medication Sig Dispense Refill   aspirin EC 81 MG tablet Take 1 tablet (81 mg total) by mouth daily. 30 tablet 0   atenolol (TENORMIN) 25 MG tablet Take 1 tablet (25 mg total) by mouth daily. 90 tablet 0   Blood Glucose Monitoring Suppl (ONETOUCH VERIO) w/Device KIT 1 Device by Does not apply route once. 1 kit 0   cholecalciferol (VITAMIN D) 1000 UNITS tablet Take 1,000 Units by mouth daily.     Cyanocobalamin (VITAMIN B-12 PO) Take 1 tablet by mouth daily as needed (fatigue).     famotidine (PEPCID) 20 MG tablet Take 20 mg by mouth 2 (two) times daily.     glucose blood (ONETOUCH VERIO) test strip Use as instructed 100 each 12   Lancets (ONETOUCH ULTRASOFT) lancets Use as instructed 100 each 3   levocetirizine (XYZAL) 5 MG tablet Take 1 tablet (5 mg total) by mouth every evening. 90 tablet 1   lidocaine-prilocaine (EMLA) cream Apply 1 application. topically as needed. Apply to port and cover with saran wrap 1-2 hours prior to port access 30 g 1    montelukast (SINGULAIR) 10 MG tablet TAKE 1 TABLET BY MOUTH EVERYDAY AT BEDTIME 90 tablet 1   Multiple Vitamin (MULTIVITAMIN WITH MINERALS) TABS tablet Take 1 tablet by mouth 2 (two) times a week.     ondansetron (ZOFRAN) 8 MG tablet Take 1 tablet (8 mg total) by mouth every 8 (eight) hours as needed for nausea or vomiting. 40 tablet 1   OZEMPIC, 0.25 OR 0.5 MG/DOSE, 2 MG/1.5ML SOPN Inject 0.5 mg into the skin every Sunday.     promethazine (PHENERGAN) 25 MG tablet Take 1 tablet (25 mg total) by mouth every 6 (six) hours as needed for nausea or vomiting. 40 tablet 0   rosuvastatin (CRESTOR) 10 MG tablet Take 1 tablet (10 mg total) by mouth every morning. 90 tablet 1   spironolactone (ALDACTONE) 50 MG tablet Take 1 tablet (50 mg total) by mouth daily. 90 tablet 0   azelastine (ASTELIN) 0.1 % nasal spray Place 2 sprays into both nostrils daily as needed for allergies or rhinitis. Use in each nostril as directed (Patient not taking: Reported on 11/01/2022)     betamethasone, augmented, (DIPROLENE) 0.05 % lotion Apply 1 application topically 2 (two) times daily. (Patient not taking: Reported on 10/12/2022)     dronabinol (MARINOL) 2.5 MG capsule Take 1 capsule (2.5 mg total) by mouth daily before lunch. (Patient not taking: Reported on 11/01/2022) 60 capsule 0   EPINEPHrine (EPIPEN 2-PAK) 0.3 mg/0.3 mL IJ SOAJ injection Inject 0.3 mg into the muscle as needed for anaphylaxis. (Patient not taking: Reported on 11/01/2022) 2 each 0   magnesium oxide (MAG-OX) 400 (240 Mg) MG tablet Take 1 tablet (400 mg total) by mouth daily. (Patient not taking: Reported on 11/01/2022) 15 tablet 0   pantoprazole (PROTONIX) 40 MG tablet Take 1 tablet (40 mg total) by mouth daily. (Patient not taking: Reported on 11/22/2022) 14 tablet 0   rizatriptan (MAXALT-MLT) 10 MG disintegrating tablet Take 5-10 mg by mouth as needed for migraine. May repeat in 2 hours if needed (Patient not taking: Reported on 11/01/2022)     No current  facility-administered medications for this visit.   Facility-Administered Medications  Ordered in Other Visits  Medication Dose Route Frequency Provider Last Rate Last Admin   heparin lock flush 100 unit/mL  500 Units Intravenous Once Cammie Sickle, MD          .  PHYSICAL EXAMINATION: ECOG PERFORMANCE STATUS: 0 - Asymptomatic  Vitals:   11/22/22 0900  BP: 117/80  Pulse: 80  Resp: (!) 89  Temp: 97.7 F (36.5 C)   Filed Weights   11/22/22 0900  Weight: 183 lb 12.8 oz (83.4 kg)    Physical Exam Vitals and nursing note reviewed.  HENT:     Head: Normocephalic and atraumatic.     Mouth/Throat:     Pharynx: Oropharynx is clear.  Eyes:     Extraocular Movements: Extraocular movements intact.     Pupils: Pupils are equal, round, and reactive to light.  Cardiovascular:     Rate and Rhythm: Normal rate and regular rhythm.  Pulmonary:     Comments: Decreased breath sounds bilaterally.  Abdominal:     Palpations: Abdomen is soft.  Musculoskeletal:        General: Normal range of motion.     Cervical back: Normal range of motion.  Skin:    General: Skin is warm.  Neurological:     General: No focal deficit present.     Mental Status: She is alert and oriented to person, place, and time.  Psychiatric:        Behavior: Behavior normal.        Judgment: Judgment normal.      LABORATORY DATA:  I have reviewed the data as listed Lab Results  Component Value Date   WBC 1.6 (L) 11/22/2022   HGB 9.2 (L) 11/22/2022   HCT 29.3 (L) 11/22/2022   MCV 91.3 11/22/2022   PLT 131 (L) 11/22/2022   Recent Labs    11/01/22 1015 11/08/22 0913 11/22/22 0841  NA 138 136 141  K 3.9 4.2 3.7  CL 105 105 107  CO2 _0 GLUCOSE 116* 150* 109*  BUN 10 17 7*  CREATININE 0.69 0.79 0.80  CALCIUM 9.2 9.3 8.7*  GFRNONAA >60 >60 >60  PROT 6.9 7.2 6.8  ALBUMIN 3.3* 3.4* 3.4*  AST _1 ALT _2 ALKPHOS 80 66 73  BILITOT 0.4 0.5 0.4    RADIOGRAPHIC  STUDIES: I have personally reviewed the radiological images as listed and agreed with the findings in the report. No results found.  ASSESSMENT & PLAN:   Carcinoma of upper-outer quadrant of right breast in female, estrogen receptor negative ([place]) # Stage II triple negative breast cancer [mT1cN1; Grade 3 ]- s/p mastectomy.  Patient currently on adjuvant chemoimmunotherapy [PZWCHEN 277 study]; patient s/p carboplatin weekly Taxol.  Currently on Adriamycin-Cytoxan Keytruda-s/p 3 cycles.  # HOLD cycle #4 Adriamycin Cytoxan Keytruda Labs today reviewed;  UNacceptable for treatment - ANC 0.8 today. June 6th- ECHO- 05/25/2022- 65-70%. TSH AUG 2023-WNL.  Re-schedule to 1 week from now.   # Abdominal/epigastric pain status post EGD [NOV 2023- no ulcers; Dr.Wohl]- peptobismol prn-STABLE.   # Nausea- no vomiting- needing IVFs in clinic.  Encourage antiemetics.  Monitor for now.  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   # Hypomagnesemia magnesium NOV 2023- 1.7 continue mag oxide to twice a day.  # DM-we will cut down the steroids to 6 mg.  Blood sugars today 116 [Orange juice]-  on ozempic.  Monitor blood sugars closely. STABLE.   # DISPOSITION: # HOLD chemo today; DE-access # Follow up MD- in 1week/Tuesday; port/ labs- cbc/cmp;mag Adriamycin-Cytoxan--keytruda; D-2Ellen Henri- Dr.B       All questions were answered. The patient/family knows to call the clinic with any problems, questions or concerns.       Cammie Sickle, MD 11/22/2022 10:58 AM

## 2022-11-22 NOTE — Progress Notes (Signed)
Patient denies new problems/concerns today.   °

## 2022-11-23 ENCOUNTER — Inpatient Hospital Stay: Payer: Medicare Other

## 2022-11-28 ENCOUNTER — Other Ambulatory Visit: Payer: Self-pay | Admitting: Family Medicine

## 2022-11-28 DIAGNOSIS — J302 Other seasonal allergic rhinitis: Secondary | ICD-10-CM

## 2022-11-30 ENCOUNTER — Inpatient Hospital Stay (HOSPITAL_BASED_OUTPATIENT_CLINIC_OR_DEPARTMENT_OTHER): Payer: Medicare Other | Admitting: Internal Medicine

## 2022-11-30 ENCOUNTER — Inpatient Hospital Stay: Payer: Medicare Other

## 2022-11-30 ENCOUNTER — Other Ambulatory Visit: Payer: Self-pay | Admitting: Family Medicine

## 2022-11-30 VITALS — BP 115/76 | HR 92 | Temp 98.7°F | Resp 18 | Ht 66.0 in | Wt 178.7 lb

## 2022-11-30 DIAGNOSIS — C50811 Malignant neoplasm of overlapping sites of right female breast: Secondary | ICD-10-CM

## 2022-11-30 DIAGNOSIS — J302 Other seasonal allergic rhinitis: Secondary | ICD-10-CM

## 2022-11-30 DIAGNOSIS — Z5112 Encounter for antineoplastic immunotherapy: Secondary | ICD-10-CM | POA: Diagnosis not present

## 2022-11-30 DIAGNOSIS — Z171 Estrogen receptor negative status [ER-]: Secondary | ICD-10-CM

## 2022-11-30 DIAGNOSIS — C50411 Malignant neoplasm of upper-outer quadrant of right female breast: Secondary | ICD-10-CM | POA: Diagnosis not present

## 2022-11-30 LAB — COMPREHENSIVE METABOLIC PANEL
ALT: 22 U/L (ref 0–44)
AST: 27 U/L (ref 15–41)
Albumin: 3.4 g/dL — ABNORMAL LOW (ref 3.5–5.0)
Alkaline Phosphatase: 70 U/L (ref 38–126)
Anion gap: 10 (ref 5–15)
BUN: 16 mg/dL (ref 8–23)
CO2: 22 mmol/L (ref 22–32)
Calcium: 8.7 mg/dL — ABNORMAL LOW (ref 8.9–10.3)
Chloride: 108 mmol/L (ref 98–111)
Creatinine, Ser: 0.77 mg/dL (ref 0.44–1.00)
GFR, Estimated: >60 mL/min (ref >60)
Glucose, Bld: 119 mg/dL — ABNORMAL HIGH (ref 70–99)
Potassium: 3.6 mmol/L (ref 3.5–5.1)
Sodium: 140 mmol/L (ref 135–145)
Total Bilirubin: 0.4 mg/dL (ref 0.3–1.2)
Total Protein: 7.1 g/dL (ref 6.5–8.1)

## 2022-11-30 LAB — CBC WITH DIFFERENTIAL/PLATELET
Abs Immature Granulocytes: 0.02 10*3/uL (ref 0.00–0.07)
Basophils Absolute: 0 10*3/uL (ref 0.0–0.1)
Basophils Relative: 0 %
Eosinophils Absolute: 0 10*3/uL (ref 0.0–0.5)
Eosinophils Relative: 0 %
HCT: 31 % — ABNORMAL LOW (ref 36.0–46.0)
Hemoglobin: 9.7 g/dL — ABNORMAL LOW (ref 12.0–15.0)
Immature Granulocytes: 1 %
Lymphocytes Relative: 19 %
Lymphs Abs: 0.5 10*3/uL — ABNORMAL LOW (ref 0.7–4.0)
MCH: 28.3 pg (ref 26.0–34.0)
MCHC: 31.3 g/dL (ref 30.0–36.0)
MCV: 90.4 fL (ref 80.0–100.0)
Monocytes Absolute: 0.4 10*3/uL (ref 0.1–1.0)
Monocytes Relative: 13 %
Neutro Abs: 1.8 10*3/uL (ref 1.7–7.7)
Neutrophils Relative %: 67 %
Platelets: 194 10*3/uL (ref 150–400)
RBC: 3.43 MIL/uL — ABNORMAL LOW (ref 3.87–5.11)
RDW: 14.5 % (ref 11.5–15.5)
WBC: 2.6 10*3/uL — ABNORMAL LOW (ref 4.0–10.5)
nRBC: 0 % (ref 0.0–0.2)

## 2022-11-30 MED ORDER — HEPARIN SOD (PORK) LOCK FLUSH 100 UNIT/ML IV SOLN
500.0000 [IU] | Freq: Once | INTRAVENOUS | Status: AC
  Administered 2022-11-30: 500 [IU] via INTRAVENOUS
  Filled 2022-11-30: qty 5

## 2022-11-30 MED ORDER — LEVOCETIRIZINE DIHYDROCHLORIDE 5 MG PO TABS: 5.0000 mg | ORAL_TABLET | Freq: Every evening | ORAL | 1 refills | Status: DC

## 2022-11-30 MED FILL — Fosaprepitant Dimeglumine For IV Infusion 150 MG (Base Eq): INTRAVENOUS | Qty: 5 | Status: AC

## 2022-11-30 NOTE — Progress Notes (Signed)
Neuropathy bothering her more.  Good and bad days with her appetite with 5 lb loss, does take Ozempic.

## 2022-11-30 NOTE — Addendum Note (Signed)
Addended by: Sinclair Grooms D on: 11/30/2022 12:35 PM   Modules accepted: Orders

## 2022-11-30 NOTE — Telephone Encounter (Signed)
Requested Prescriptions  Pending Prescriptions Disp Refills   levocetirizine (XYZAL) 5 MG tablet 90 tablet 1    Sig: Take 1 tablet (5 mg total) by mouth every evening.     Ear, Nose, and Throat:  Antihistamines - levocetirizine dihydrochloride Passed - 11/30/2022  3:42 PM      Passed - Cr in normal range and within 360 days    Creat  Date Value Ref Range Status  12/31/2021 0.81 0.60 - 1.00 mg/dL Final   Creatinine, Ser  Date Value Ref Range Status  11/30/2022 0.77 0.44 - 1.00 mg/dL Final   Creatinine, Urine  Date Value Ref Range Status  01/30/2019 225 20 - 275 mg/dL Final         Passed - eGFR is 10 or above and within 360 days    GFR, Est African American  Date Value Ref Range Status  10/09/2019 85 > OR = 60 mL/min/1.47m Final   GFR, Est Non African American  Date Value Ref Range Status  10/09/2019 74 > OR = 60 mL/min/1.71mFinal   GFR, Estimated  Date Value Ref Range Status  11/30/2022 >60 >60 mL/min Final    Comment:    (NOTE) Calculated using the CKD-EPI Creatinine Equation (2021)    eGFR  Date Value Ref Range Status  12/31/2021 78 > OR = 60 mL/min/1.7367minal    Comment:    The eGFR is based on the CKD-EPI 2021 equation. To calculate  the new eGFR from a previous Creatinine or Cystatin C result, go to https://www.kidney.org/professionals/ kdoqi/gfr%5Fcalculator          Passed - Valid encounter within last 12 months    Recent Outpatient Visits           3 months ago Essential hypertension   [facility]   3 months ago [facility]ys[facility] with [facility]   10 months ago Non-recurrent acute serous otitis media of left ear   [facility]   11 months ago Well adult exam   [facility]   1 year ago Vitamin D deficiency   [facility]       Future Appointments             In 4 weeks  [facility]

## 2022-11-30 NOTE — Progress Notes (Signed)
No treatment today per MD (insurance issues)

## 2022-11-30 NOTE — Progress Notes (Signed)
[facility] CONSULT NOTE  Patient Care Team: Sowles, [name], MD as PCP - General (Family Medicine) [name], Jeffrey W, MD (General Surgery) Vaught, Creighton, MD as Referring Physician (Otolaryngology) Callwood, Dwayne D, MD as Consulting Physician (Cardiology) Benitez-Graham, Ana, MD as Referring Physician (Dermatology) O'Connell, Thomas L, MD as Consulting Physician (Internal Medicine) Natahlia Hoggard R, MD as Consulting Physician (Internal Medicine)  CHIEF COMPLAINTS/PURPOSE OF CONSULTATION: Breast cancer  #  Oncology History Overview Note  She has a remote history of right breast cancer, T1bN0, ER positive,s/p lumpectomy and MammoSite radiation and 5 years of Arimidex.    02/19/22 screening mammogram bilaterally showed  1. Suspicious right breast mass at the 10 o'clock position 8 cm from the nipple. Recommend ultrasound-guided biopsy. 2. Indeterminate right breast mass at the 6 o'clock position 4 cm from the nipple. Recommend ultrasound-guided biopsy. 3. No suspicious right axillary lymphadenopathy.   04/12/2022 Unilateral right breast diagnostic mammogram showed 1. Suspicious right breast mass at the 10 o'clock position 8 cm from the nipple. Recommend ultrasound-guided biopsy. 2. Indeterminate right breast mass at the 6 o'clock position 4 cm from the nipple. Recommend ultrasound-guided biopsy. 3. No suspicious right axillary lymphadenopathy.   04/13/2022 right breast mass 6 o'clock position 1 cm from the nipple showed invasive mammary carcinoma, no special type. Grade 3, DCIS present high grade, LVI not identified. ER-, PR 1-10%, HER 2 -   04/13/2022, right breast mass 10:00 7 cm from nipple biopsy showed invasive mammary carcinoma, grade 3, high-grade DCIS with focal comedonecrosis, lymphovascular invasion not identified.  ER -, PR 1 to 10%, HER2-    04/26/2022, right breast mastectomy with axillary dissection Invasive mammary carcinoma, no special type, multifocal, DCIS  high-grade, benign nipple/areola.  2 deposits of invasive mammary carcinoma, no definite residual lymph node identified.   Breast cancer (HCC)  05/10/2022 Initial Diagnosis   Breast cancer (HCC)   05/10/2022 Cancer Staging   Staging form: Breast, AJCC 8th Edition - Pathologic stage from 05/10/2022: Stage IIA (pT1c, pN1a, cM0, G3, ER-, PR+, HER2-) - Signed by Yu, Zhou, MD on 05/10/2022 Stage prefix: Initial diagnosis Histologic grading system: 3 grade system   05/24/2022 - 05/24/2022 Chemotherapy   Patient is on Treatment Plan : BREAST Pembrolizumab (200) D1 + Carboplatin (5) D1 + Paclitaxel (80) D1,8,15 q21d X 4 cycles / Pembrolizumab (200) D1 + AC D1 q21d x 4 cycles     05/27/2022 - 08/13/2022 Chemotherapy   Patient is on Treatment Plan : BREAST Pembrolizumab (200) D1 + Carboplatin (5) D1 + Paclitaxel (80) D1,8,15 q21d X 4 cycles / Pembrolizumab (200) D1 + AC D1 q21d x 4 cycles     05/27/2022 -  Chemotherapy   Patient is on Treatment Plan : BREAST Pembrolizumab (200) D1 + Carboplatin (5) D1 + Paclitaxel (80) D1,8,15 q21d X 4 cycles / Pembrolizumab (200) D1 + AC D1 q21d x 4 cycles     Carcinoma of upper-outer quadrant of right breast in female, estrogen receptor negative (HCC)  05/26/2022 Initial Diagnosis   Carcinoma of upper-outer quadrant of right breast in female, estrogen receptor negative (HCC)   05/26/2022 Cancer Staging   Staging form: Breast, AJCC 8th Edition - Pathologic: Stage IIA (pT1c, pN1, cM0, G3, ER-, PR-, HER2-) - Signed by Katiya Fike R, MD on 05/26/2022 Multigene prognostic tests performed: None Histologic grading system: 3 grade system   05/27/2022 -  Chemotherapy   Patient is on Treatment Plan : BREAST Pembrolizumab (200) D1 + Carboplatin (5) D1 + Paclitaxel (80)   D1,8,15 q21d X 4 cycles / Pembrolizumab (200) D1 + AC D1 q21d x 4 cycles       HISTORY OF PRESENTING ILLNESS: Ambulating independently.  Alone.   Morgan Peterson 71 y.o.  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  Patient is currently status post cycle #3 of Adriamycin Cytoxan-Keytruda.  Cycle #4 was held last week because of neutropenia.  Patient did not get her growth factor last visit because of intolerance.    Patient attributes growth factor to her abdominal pain/black-colored stool.  She did not have any significant or worsening joint pains or back pain.  Neuropathy bothering her more. She is not on gabapentin. Good and bad days with her appetite with 5 lb loss, does take Ozempic   Appetite is improving with Marinol.    Review of Systems  Constitutional:  Positive for malaise/fatigue. Negative for chills, diaphoresis, fever and weight loss.  HENT:  Negative for nosebleeds and sore throat.   Eyes:  Negative for double vision.  Respiratory:  Negative for cough, hemoptysis, sputum production, shortness of breath and wheezing.   Cardiovascular:  Negative for chest pain, palpitations, orthopnea and leg swelling.  Gastrointestinal:  Negative for abdominal pain, blood in stool, constipation, diarrhea, heartburn, melena, nausea and vomiting.  Genitourinary:  Negative for dysuria, frequency and urgency.  Musculoskeletal:  Negative for back pain and joint pain.  Skin: Negative.  Negative for itching and rash.  Neurological:  Positive for tingling. Negative for dizziness, focal weakness, weakness and headaches.  Endo/Heme/Allergies:  Does not bruise/bleed easily.  Psychiatric/Behavioral:  Negative for depression. The patient is not nervous/anxious and does not have insomnia.      MEDICAL HISTORY:  Past Medical History:  Diagnosis Date   Appetite loss    Asthma    Cervical radiculopathy    Chemotherapy-induced nausea    Hiatal hernia    Hypercholesterolemia    Hyperlipidemia    Hypertension    IBS (irritable bowel syndrome)    Malignant neoplasm of  upper-outer quadrant of female breast ([place]) 04/11/2009   Right breast, invasive ductal carcinoma, 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 Arimidex therapy completed September 2015   Mild mitral valve prolapse    Neuropathy    feet, from chemo   Personal history of malignant neoplasm of breast 2010   Personal history of radiation therapy 2010   mammosite   T2DM (type 2 diabetes mellitus) ([place]) 2011    SURGICAL HISTORY: Past Surgical History:  Procedure Laterality Date   ABDOMINAL EXPLORATION SURGERY  2000   ovarian cyst   BREAST BIOPSY Right 2010   +    BREAST EXCISIONAL BIOPSY Right 2010   + mammo site inasive mammo ca   BREAST LUMPECTOMY Right 2010   [facility]   COLONOSCOPY  07/19/2012   Normal exam, Dr. Bary Castilla   COLONOSCOPY WITH PROPOFOL N/A 04/21/2022   Procedure: COLONOSCOPY WITH PROPOFOL;  Surgeon: Robert Bellow, MD;  Location: [facility] ENDOSCOPY;  Service: Endoscopy;  Laterality: N/A;   ESOPHAGOGASTRODUODENOSCOPY (EGD) WITH PROPOFOL N/A 10/22/2022   Procedure: ESOPHAGOGASTRODUODENOSCOPY (EGD) WITH PROPOFOL;  Surgeon: Lucilla Lame, MD;  Location: [place];  Service: Endoscopy;  Laterality: N/A;  Diabetic   MASTECTOMY Right 2010   partial/ lumpectomy   PORTACATH PLACEMENT Left 05/19/2022   Procedure: INSERTION PORT-A-CATH;  Surgeon: Robert Bellow,  MD;  Location: [facility] ORS;  Service: General;  Laterality: Left;   SIMPLE MASTECTOMY WITH AXILLARY SENTINEL NODE BIOPSY Right 04/26/2022   Procedure: SIMPLE MASTECTOMY WITH AXILLARY SENTINEL NODE BIOPSY;  Surgeon: Robert Bellow, MD;  Location: [facility] ORS;  Service: General;  Laterality: Right;  RNFA to assist   TUBAL LIGATION  20 years ago    SOCIAL HISTORY: Social History   Socioeconomic History   Marital status: Married    Spouse name: Antonio   Number of children: 2   Years of education: Not on file   Highest education level: Not on file  Occupational History    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  Tobacco Use   Smoking status: Former    Packs/day: 0.25    Years: 10.00    Total pack years: 2.50    Types: Cigarettes    Start date: 1978    Quit date: 1988    Years since quitting: 35.9   Smokeless tobacco: Never   Tobacco comments:    smoking cessation materials not required  Vaping Use   Vaping Use: Never used  Substance and Sexual Activity   Alcohol use: Yes    Comment: wine occ   Drug use: No   Sexual activity: Not Currently    Comment: husband has ED  Other Topics Concern   Not on file  Social History Narrative   Oncology nurse on the floor retired.;  Husband retired from [employer].  No smoking.   Social Determinants of Health   Financial Resource Strain: Low Risk  (12/31/2021)   Overall Financial Resource Strain (CARDIA)    Difficulty of Paying Living Expenses: Not hard at all  Food Insecurity: No Food Insecurity (12/31/2021)   Hunger Vital Sign    Worried About Running Out of Food in the Last Year: Never true    Ran Out of Food in the Last Year: Never true  Transportation Needs: No Transportation Needs (12/31/2021)   PRAPARE - Hydrologist (Medical): No    Lack of Transportation (Non-Medical): No  Physical Activity: Inactive (12/31/2021)   Exercise Vital Sign    Days of Exercise per Week: 0 days    Minutes of Exercise per Session: 0 min  Stress: No Stress Concern Present (12/31/2021)   [place]    Feeling of Stress : Only a little  Social Connections: Moderately Integrated (12/31/2021)   Social Connection and Isolation Panel [NHANES]    Frequency of Communication with Friends and Family: More than three times a week    Frequency of Social Gatherings with Friends and Family: Three times a week    Attends Religious Services: More than 4 times per year    Active Member of Clubs or Organizations: No    Attends Theatre manager Meetings: Never    Marital Status: Married  Human resources officer Violence: Not At Risk (12/31/2021)   Humiliation, Afraid, Rape, and Kick questionnaire    Fear of Current or Ex-Partner: No    Emotionally Abused: No    Physically Abused: No    Sexually Abused: No    FAMILY HISTORY: Family History  Problem Relation Age of Onset   Osteoporosis Mother    Diabetes Father    Kidney disease Father    Diabetes Brother    Breast cancer Maternal Aunt    Bladder Cancer Maternal Aunt    Cervical cancer Maternal Aunt  Colon cancer Maternal Uncle    Lung cancer Maternal Uncle     ALLERGIES:  is allergic to levaquin [levofloxacin in d5w], losartan, metoprolol, saxagliptin, amlodipine-olmesartan, canagliflozin, egg white [egg white (egg protein)], gabapentin, glipizide, gluten meal, lisinopril, metformin and related, milk (cow), milk-related compounds, shrimp extract allergy skin test, tramadol, zyprexa [olanzapine], actos [pioglitazone], atorvastatin, carvedilol, and prednisone.  MEDICATIONS:  Current Outpatient Medications  Medication Sig Dispense Refill   aspirin EC 81 MG tablet Take 1 tablet (81 mg total) by mouth daily. 30 tablet 0   atenolol (TENORMIN) 25 MG tablet Take 1 tablet (25 mg total) by mouth daily. 90 tablet 0   Blood Glucose Monitoring Suppl (ONETOUCH VERIO) w/Device KIT 1 Device by Does not apply route once. 1 kit 0   cholecalciferol (VITAMIN D) 1000 UNITS tablet Take 1,000 Units by mouth daily.     Cyanocobalamin (VITAMIN B-12 PO) Take 1 tablet by mouth daily as needed (fatigue).     famotidine (PEPCID) 20 MG tablet Take 20 mg by mouth 2 (two) times daily.     glucose blood (ONETOUCH VERIO) test strip Use as instructed 100 each 12   Lancets (ONETOUCH ULTRASOFT) lancets Use as instructed 100 each 3   levocetirizine (XYZAL) 5 MG tablet TAKE 1 TABLET BY MOUTH EVERY DAY IN THE EVENING 90 tablet 1   lidocaine-prilocaine (EMLA) cream Apply 1 application. topically as  needed. Apply to port and cover with saran wrap 1-2 hours prior to port access 30 g 1   montelukast (SINGULAIR) 10 MG tablet TAKE 1 TABLET BY MOUTH EVERYDAY AT BEDTIME 90 tablet 1   Multiple Vitamin (MULTIVITAMIN WITH MINERALS) TABS tablet Take 1 tablet by mouth 2 (two) times a week.     ondansetron (ZOFRAN) 8 MG tablet Take 1 tablet (8 mg total) by mouth every 8 (eight) hours as needed for nausea or vomiting. 40 tablet 1   OZEMPIC, 0.25 OR 0.5 MG/DOSE, 2 MG/1.5ML SOPN Inject 0.5 mg into the skin every Sunday.     promethazine (PHENERGAN) 25 MG tablet Take 1 tablet (25 mg total) by mouth every 6 (six) hours as needed for nausea or vomiting. 40 tablet 0   rosuvastatin (CRESTOR) 10 MG tablet Take 1 tablet (10 mg total) by mouth every morning. 90 tablet 1   spironolactone (ALDACTONE) 50 MG tablet TAKE 1 TABLET BY MOUTH EVERY DAY 90 tablet 0   azelastine (ASTELIN) 0.1 % nasal spray Place 2 sprays into both nostrils daily as needed for allergies or rhinitis. Use in each nostril as directed (Patient not taking: Reported on 11/01/2022)     betamethasone, augmented, (DIPROLENE) 0.05 % lotion Apply 1 application topically 2 (two) times daily. (Patient not taking: Reported on 10/12/2022)     dronabinol (MARINOL) 2.5 MG capsule Take 1 capsule (2.5 mg total) by mouth daily before lunch. (Patient not taking: Reported on 11/01/2022) 60 capsule 0   EPINEPHrine (EPIPEN 2-PAK) 0.3 mg/0.3 mL IJ SOAJ injection Inject 0.3 mg into the muscle as needed for anaphylaxis. (Patient not taking: Reported on 11/01/2022) 2 each 0   magnesium oxide (MAG-OX) 400 (240 Mg) MG tablet Take 1 tablet (400 mg total) by mouth daily. (Patient not taking: Reported on 11/01/2022) 15 tablet 0   pantoprazole (PROTONIX) 40 MG tablet Take 1 tablet (40 mg total) by mouth daily. (Patient not taking: Reported on 11/22/2022) 14 tablet 0   rizatriptan (MAXALT-MLT) 10 MG disintegrating tablet Take 5-10 mg by mouth as needed for migraine. May repeat in 2  hours if needed (Patient not taking: Reported on 11/01/2022)     No current facility-administered medications for this visit.   Facility-Administered Medications Ordered in Other Visits  Medication Dose Route Frequency Provider Last Rate Last Admin   heparin lock flush 100 unit/mL  500 Units Intravenous Once Cammie Sickle, MD          .  PHYSICAL EXAMINATION: ECOG PERFORMANCE STATUS: 0 - Asymptomatic  Vitals:   11/30/22 0800  BP: 115/76  Pulse: 92  Resp: 18  Temp: 98.7 F (37.1 C)    Filed Weights   11/30/22 0800  Weight: 178 lb 11.2 oz (81.1 kg)     Physical Exam Vitals and nursing note reviewed.  HENT:     Head: Normocephalic and atraumatic.     Mouth/Throat:     Pharynx: Oropharynx is clear.  Eyes:     Extraocular Movements: Extraocular movements intact.     Pupils: Pupils are equal, round, and reactive to light.  Cardiovascular:     Rate and Rhythm: Normal rate and regular rhythm.  Pulmonary:     Comments: Decreased breath sounds bilaterally.  Abdominal:     Palpations: Abdomen is soft.  Musculoskeletal:        General: Normal range of motion.     Cervical back: Normal range of motion.  Skin:    General: Skin is warm.  Neurological:     General: No focal deficit present.     Mental Status: She is alert and oriented to person, place, and time.  Psychiatric:        Behavior: Behavior normal.        Judgment: Judgment normal.      LABORATORY DATA:  I have reviewed the data as listed Lab Results  Component Value Date   WBC 2.6 (L) 11/30/2022   HGB 9.7 (L) 11/30/2022   HCT 31.0 (L) 11/30/2022   MCV 90.4 11/30/2022   PLT 194 11/30/2022   Recent Labs    11/08/22 0913 11/22/22 0841 11/30/22 0819  NA 136 141 140  K 4.2 3.7 3.6  CL 105 107 108  CO2 _0 GLUCOSE 150* 109* 119*  BUN 17 7* 16  CREATININE 0.79 0.80 0.77  CALCIUM 9.3 8.7* 8.7*  GFRNONAA >60 >60 >60  PROT 7.2 6.8 7.1  ALBUMIN 3.4* 3.4* 3.4*  AST _1 ALT _2 ALKPHOS 66 73 70  BILITOT 0.5 0.4 0.4    RADIOGRAPHIC STUDIES: I have personally reviewed the radiological images as listed and agreed with the findings in the report. No results found.  ASSESSMENT & PLAN:   Carcinoma of upper-outer quadrant of right breast in female, estrogen receptor negative ([place]) # Stage II triple negative breast cancer [mT1cN1; Grade 3 ]- s/p mastectomy.  Patient currently on adjuvant chemoimmunotherapy [HRCBULA 453 study]; patient s/p carboplatin weekly Taxol.  Currently on Adriamycin-Cytoxan Keytruda-s/p 3 cycles.  # Proceed #4 Adriamycin Cytoxan Keytruda Labs today reviewed;  acceptable for treatment - ANC 1.8  today. June 6th- ECHO- 05/25/2022- 65-70%. TSH AUG 2023-WNL.  Discontinue udenyca; plan to start zarxio daily x5 post chemo; with cycle #4 will also dose reduce Adriamycin to 40 mg/m2.  Again reviewed with the patient that she will need Keytruda every 3 x 9 cycles.  # Abdominal/epigastric pain status post EGD [NOV 2023- no ulcers; Dr.Wohl]- peptobismol prn- ?? Sec to [facility]- see above]- see below-STABLE  # Nausea- no vomiting- needing IVFs in clinic.  Encourage antiemetics.  Monitor for now.  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   # Hypomagnesemia magnesium NOV 2023- 1.7 continue mag oxide to twice a day.  # DM-we will cut down the steroids to 6 mg.  Blood sugars today 116 [Orange juice]- on ozempic.  Monitor blood sugars closely. STABLE.  # DISPOSITION: # chemo today-  # cancel undenyca tomorrow.  # schedule Zarxio daily- start 12/13 x5 doses; skip the week end. # follow up TBD- Dr.B        All questions were answered. The patient/family knows to call the clinic with any problems, questions or concerns.       Cammie Sickle, MD 11/30/2022 2:00 PM

## 2022-11-30 NOTE — Telephone Encounter (Signed)
Medication Refill - Medication:  levocetirizine (XYZAL) 5 MG tablet 90     Has the patient contacted their pharmacy? yes (Agent: If no, request that the patient contact the pharmacy for the refill. If patient does not wish to contact the pharmacy document the reason why and proceed with request.) (Agent: If yes, when and what did the pharmacy advise?)contact pcp  ransmission failed previously Preferred Pharmacy (with phone number or street name):  [id number] [address] Phone: [phone] Fax: [phone]    Has the patient been seen for an appointment in the last year OR does the patient have an upcoming appointment? yes  Agent: Please be advised that RX refills may take up to 3 business days. We ask that you follow-up with your pharmacy.

## 2022-11-30 NOTE — Assessment & Plan Note (Addendum)
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  Patient currently on adjuvant chemoimmunotherapy [EFUWTKT 828 study]; patient s/p carboplatin weekly Taxol.  Currently on Adriamycin-Cytoxan Keytruda-s/p 3 cycles.   # Proceed #4 Adriamycin Cytoxan Keytruda Labs today reviewed;  acceptable for treatment - ANC 1.8  today. June 6th- ECHO- 05/25/2022- 65-70%. TSH AUG 2023-WNL.  Discontinue udenyca; plan to start zarxio daily x5 post chemo; with cycle #4 will also dose reduce Adriamycin to 40 mg/m2.  Again reviewed with the patient that she will need Keytruda every 3 x 9 cycles.   # Abdominal/epigastric pain status post EGD [NOV 2023- no ulcers; Dr.Wohl]- peptobismol prn- ?? Sec to [facility]- see above]- see below-STABLE  # Nausea- no vomiting- needing IVFs in clinic.  Encourage antiemetics.  Monitor for now.  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   # Hypomagnesemia magnesium NOV 2023- 1.7 continue mag oxide to twice a day.  # DM-we will cut down the steroids to 6 mg.  Blood sugars today 116 [Orange juice]- on ozempic.  Monitor blood sugars closely. STABLE.  # DISPOSITION: # chemo today-  # cancel undenyca tomorrow.  # schedule Zarxio daily- start 12/13 x5 doses; skip the week end. # follow up TBD- Dr.B  Addendum: 12/12-patient's chemotherapy moved next week; pending insurance approval for zarxio; schedule Zarxio daily- start 12/20 x5 doses; skip the week end.

## 2022-12-01 ENCOUNTER — Other Ambulatory Visit: Payer: Self-pay | Admitting: Internal Medicine

## 2022-12-01 ENCOUNTER — Inpatient Hospital Stay: Payer: Medicare Other

## 2022-12-06 MED FILL — Fosaprepitant Dimeglumine For IV Infusion 150 MG (Base Eq): INTRAVENOUS | Qty: 5 | Status: AC

## 2022-12-07 ENCOUNTER — Inpatient Hospital Stay: Payer: Medicare Other

## 2022-12-07 VITALS — HR 85 | Temp 98.3°F | Resp 16 | Wt 182.4 lb

## 2022-12-07 DIAGNOSIS — Z171 Estrogen receptor negative status [ER-]: Secondary | ICD-10-CM

## 2022-12-07 DIAGNOSIS — Z5112 Encounter for antineoplastic immunotherapy: Secondary | ICD-10-CM | POA: Diagnosis not present

## 2022-12-07 DIAGNOSIS — C50811 Malignant neoplasm of overlapping sites of right female breast: Secondary | ICD-10-CM

## 2022-12-07 MED ORDER — SODIUM CHLORIDE 0.9 % IV SOLN
600.0000 mg/m2 | Freq: Once | INTRAVENOUS | Status: AC
  Administered 2022-12-07: 1220 mg via INTRAVENOUS
  Filled 2022-12-07: qty 61

## 2022-12-07 MED ORDER — SODIUM CHLORIDE 0.9 % IV SOLN
150.0000 mg | Freq: Once | INTRAVENOUS | Status: AC
  Administered 2022-12-07: 150 mg via INTRAVENOUS
  Filled 2022-12-07: qty 5
  Filled 2022-12-07: qty 150
  Filled 2022-12-07: qty 5

## 2022-12-07 MED ORDER — DEXAMETHASONE SODIUM PHOSPHATE 10 MG/ML IJ SOLN
6.0000 mg | Freq: Once | INTRAMUSCULAR | Status: AC
  Administered 2022-12-07: 6 mg via INTRAVENOUS
  Filled 2022-12-07: qty 1

## 2022-12-07 MED ORDER — PALONOSETRON HCL INJECTION 0.25 MG/5ML
0.2500 mg | Freq: Once | INTRAVENOUS | Status: AC
  Administered 2022-12-07: 0.25 mg via INTRAVENOUS
  Filled 2022-12-07: qty 5

## 2022-12-07 MED ORDER — DOXORUBICIN HCL CHEMO IV INJECTION 2 MG/ML
40.0000 mg/m2 | Freq: Once | INTRAVENOUS | Status: AC
  Administered 2022-12-07: 82 mg via INTRAVENOUS
  Filled 2022-12-07: qty 41

## 2022-12-07 MED ORDER — SODIUM CHLORIDE 0.9 % IV SOLN
200.0000 mg | Freq: Once | INTRAVENOUS | Status: AC
  Administered 2022-12-07: 200 mg via INTRAVENOUS
  Filled 2022-12-07: qty 8

## 2022-12-07 MED ORDER — SODIUM CHLORIDE 0.9 % IV SOLN: 6.0000 mg | Freq: Once | INTRAVENOUS | Status: DC

## 2022-12-07 MED ORDER — SODIUM CHLORIDE 0.9 % IV SOLN
Freq: Once | INTRAVENOUS | Status: AC
  Filled 2022-12-07: qty 250

## 2022-12-07 MED ORDER — HEPARIN SOD (PORK) LOCK FLUSH 100 UNIT/ML IV SOLN
500.0000 [IU] | Freq: Once | INTRAVENOUS | Status: AC | PRN
  Administered 2022-12-07: 500 [IU]
  Filled 2022-12-07: qty 5

## 2022-12-07 NOTE — Patient Instructions (Signed)
[facility] AT [place]  Discharge Instructions: Thank you for choosing [facility] to provide your oncology and hematology care.  If you have a lab appointment with the [facility], please go directly to the [facility] and check in at the registration area.  Wear comfortable clothing and clothing appropriate for easy access to any Portacath or PICC line.   We strive to give you quality time with your provider. You may need to reschedule your appointment if you arrive late (15 or more minutes).  Arriving late affects you and other patients whose appointments are after yours.  Also, if you miss three or more appointments without notifying the office, you may be dismissed from the clinic at the provider's discretion.      For prescription refill requests, have your pharmacy contact our office and allow 72 hours for refills to be completed.    Today you received the following chemotherapy and/or immunotherapy agents Keytruda, Adriamycin, Cytoxan      To help prevent nausea and vomiting after your treatment, we encourage you to take your nausea medication as directed.  BELOW ARE SYMPTOMS THAT SHOULD BE REPORTED IMMEDIATELY: *FEVER GREATER THAN 100.4 F (38 C) OR HIGHER *CHILLS OR SWEATING *NAUSEA AND VOMITING THAT IS NOT CONTROLLED WITH YOUR NAUSEA MEDICATION *UNUSUAL SHORTNESS OF BREATH *UNUSUAL BRUISING OR BLEEDING *URINARY PROBLEMS (pain or burning when urinating, or frequent urination) *BOWEL PROBLEMS (unusual diarrhea, constipation, pain near the anus) TENDERNESS IN MOUTH AND THROAT WITH OR WITHOUT PRESENCE OF ULCERS (sore throat, sores in mouth, or a toothache) UNUSUAL RASH, SWELLING OR PAIN  UNUSUAL VAGINAL DISCHARGE OR ITCHING   Items with * indicate a potential emergency and should be followed up as soon as possible or go to the Emergency Department if any problems should occur.  Please show the CHEMOTHERAPY ALERT CARD or IMMUNOTHERAPY ALERT  CARD at check-in to the Emergency Department and triage nurse.  Should you have questions after your visit or need to cancel or reschedule your appointment, please contact [facility] CANCER [place] AT [place]  [phone] and follow the prompts.  Office hours are 8:00 a.m. to 4:30 p.m. Monday - Friday. Please note that voicemails left after 4:00 p.m. may not be returned until the following business day.  We are closed weekends and major holidays. You have access to a nurse at all times for urgent questions. Please call the main number to the clinic [phone] and follow the prompts.  For any non-urgent questions, you may also contact your provider using MyChart. We now offer e-Visits for anyone 51 and older to request care online for non-urgent symptoms. For details visit [email or web address].   Also download the MyChart app! Go to the app store, search "MyChart", open the app, select [facility], and log in with your MyChart username and password.  Masks are optional in the cancer centers. If you would like for your care team to wear a mask while they are taking care of you, please let them know. For doctor visits, patients may have with them one support person who is at least 71 years old. At this time, visitors are not allowed in the infusion area.

## 2022-12-07 NOTE — Progress Notes (Signed)
Per Dr. Rogue Bussing, ok to tx pt today with labs from 11/30/22

## 2022-12-08 ENCOUNTER — Inpatient Hospital Stay: Payer: Medicare Other

## 2022-12-08 DIAGNOSIS — Z5112 Encounter for antineoplastic immunotherapy: Secondary | ICD-10-CM | POA: Diagnosis not present

## 2022-12-08 DIAGNOSIS — Z171 Estrogen receptor negative status [ER-]: Secondary | ICD-10-CM

## 2022-12-08 MED ORDER — FILGRASTIM-SNDZ 480 MCG/0.8ML IJ SOSY
480.0000 ug | PREFILLED_SYRINGE | Freq: Once | INTRAMUSCULAR | Status: AC
  Administered 2022-12-08: 480 ug via SUBCUTANEOUS
  Filled 2022-12-08: qty 0.8

## 2022-12-09 ENCOUNTER — Inpatient Hospital Stay: Payer: Medicare Other

## 2022-12-09 DIAGNOSIS — Z5112 Encounter for antineoplastic immunotherapy: Secondary | ICD-10-CM | POA: Diagnosis not present

## 2022-12-09 DIAGNOSIS — C50411 Malignant neoplasm of upper-outer quadrant of right female breast: Secondary | ICD-10-CM

## 2022-12-09 MED ORDER — FILGRASTIM-SNDZ 480 MCG/0.8ML IJ SOSY
480.0000 ug | PREFILLED_SYRINGE | Freq: Once | INTRAMUSCULAR | Status: AC
  Administered 2022-12-09: 480 ug via SUBCUTANEOUS
  Filled 2022-12-09: qty 0.8

## 2022-12-10 ENCOUNTER — Inpatient Hospital Stay: Payer: Medicare Other

## 2022-12-10 DIAGNOSIS — Z171 Estrogen receptor negative status [ER-]: Secondary | ICD-10-CM

## 2022-12-10 DIAGNOSIS — Z5112 Encounter for antineoplastic immunotherapy: Secondary | ICD-10-CM | POA: Diagnosis not present

## 2022-12-10 MED ORDER — FILGRASTIM-SNDZ 480 MCG/0.8ML IJ SOSY
480.0000 ug | PREFILLED_SYRINGE | Freq: Once | INTRAMUSCULAR | Status: AC
  Administered 2022-12-10: 480 ug via SUBCUTANEOUS
  Filled 2022-12-10: qty 0.8

## 2022-12-14 ENCOUNTER — Inpatient Hospital Stay: Payer: Medicare Other

## 2022-12-14 ENCOUNTER — Telehealth: Payer: Self-pay | Admitting: Internal Medicine

## 2022-12-14 NOTE — Telephone Encounter (Signed)
Pt left VM and stated she was not coming in for next two injection appts. States it does not make her feel good and does not want anymore.

## 2022-12-14 NOTE — Telephone Encounter (Signed)
Received tx on 12/19.  Received Zarxio on 12/20, 12/21 and 12/22.

## 2022-12-15 ENCOUNTER — Inpatient Hospital Stay: Payer: Medicare Other

## 2022-12-16 ENCOUNTER — Telehealth: Payer: Self-pay | Admitting: *Deleted

## 2022-12-16 DIAGNOSIS — Z95828 Presence of other vascular implants and grafts: Secondary | ICD-10-CM

## 2022-12-16 DIAGNOSIS — Z171 Estrogen receptor negative status [ER-]: Secondary | ICD-10-CM

## 2022-12-16 NOTE — Telephone Encounter (Signed)
Pt called and left message that she was not doing good. She did not get her last 2 zarxio inj. And feels achy and  chills with no fever, not wanting to eat or drink, and mouth is dry. Sent message to Dr. Rogue Bussing and he sent message to have pt see [facility] with labs cbc/d , met c and possible fluids. I called pt and she can come over 1:15 tom. And to have emla cream for her to get port labs. She is ok with this

## 2022-12-17 ENCOUNTER — Telehealth: Payer: Self-pay

## 2022-12-17 ENCOUNTER — Inpatient Hospital Stay: Payer: Medicare Other

## 2022-12-17 ENCOUNTER — Other Ambulatory Visit: Payer: Self-pay

## 2022-12-17 ENCOUNTER — Inpatient Hospital Stay (HOSPITAL_BASED_OUTPATIENT_CLINIC_OR_DEPARTMENT_OTHER): Payer: Medicare Other | Admitting: Medical Oncology

## 2022-12-17 ENCOUNTER — Encounter: Payer: Self-pay | Admitting: Medical Oncology

## 2022-12-17 VITALS — BP 134/91 | HR 93 | Temp 98.8°F | Wt 171.0 lb

## 2022-12-17 DIAGNOSIS — D709 Neutropenia, unspecified: Secondary | ICD-10-CM

## 2022-12-17 DIAGNOSIS — Z171 Estrogen receptor negative status [ER-]: Secondary | ICD-10-CM | POA: Diagnosis not present

## 2022-12-17 DIAGNOSIS — K3189 Other diseases of stomach and duodenum: Secondary | ICD-10-CM

## 2022-12-17 DIAGNOSIS — C50411 Malignant neoplasm of upper-outer quadrant of right female breast: Secondary | ICD-10-CM

## 2022-12-17 DIAGNOSIS — Z5112 Encounter for antineoplastic immunotherapy: Secondary | ICD-10-CM | POA: Diagnosis not present

## 2022-12-17 LAB — CBC WITH DIFFERENTIAL/PLATELET
Abs Immature Granulocytes: 0.01 10*3/uL (ref 0.00–0.07)
Basophils Absolute: 0 10*3/uL (ref 0.0–0.1)
Basophils Relative: 0 %
Eosinophils Absolute: 0 10*3/uL (ref 0.0–0.5)
Eosinophils Relative: 4 %
HCT: 29.7 % — ABNORMAL LOW (ref 36.0–46.0)
Hemoglobin: 9.6 g/dL — ABNORMAL LOW (ref 12.0–15.0)
Immature Granulocytes: 4 %
Lymphocytes Relative: 30 %
Lymphs Abs: 0.1 10*3/uL — ABNORMAL LOW (ref 0.7–4.0)
MCH: 28.4 pg (ref 26.0–34.0)
MCHC: 32.3 g/dL (ref 30.0–36.0)
MCV: 87.9 fL (ref 80.0–100.0)
Monocytes Absolute: 0.1 10*3/uL (ref 0.1–1.0)
Monocytes Relative: 30 %
Neutro Abs: 0.1 10*3/uL — CL (ref 1.7–7.7)
Neutrophils Relative %: 32 %
Platelets: 39 10*3/uL — ABNORMAL LOW (ref 150–400)
RBC: 3.38 MIL/uL — ABNORMAL LOW (ref 3.87–5.11)
RDW: 14.7 % (ref 11.5–15.5)
WBC: 0.3 10*3/uL — CL (ref 4.0–10.5)
nRBC: 0 % (ref 0.0–0.2)

## 2022-12-17 LAB — COMPREHENSIVE METABOLIC PANEL
ALT: 57 U/L — ABNORMAL HIGH (ref 0–44)
AST: 58 U/L — ABNORMAL HIGH (ref 15–41)
Albumin: 3.4 g/dL — ABNORMAL LOW (ref 3.5–5.0)
Alkaline Phosphatase: 64 U/L (ref 38–126)
Anion gap: 10 (ref 5–15)
BUN: 27 mg/dL — ABNORMAL HIGH (ref 8–23)
CO2: 23 mmol/L (ref 22–32)
Calcium: 9.1 mg/dL (ref 8.9–10.3)
Chloride: 103 mmol/L (ref 98–111)
Creatinine, Ser: 0.98 mg/dL (ref 0.44–1.00)
GFR, Estimated: >60 mL/min (ref >60)
Glucose, Bld: 161 mg/dL — ABNORMAL HIGH (ref 70–99)
Potassium: 4.3 mmol/L (ref 3.5–5.1)
Sodium: 136 mmol/L (ref 135–145)
Total Bilirubin: 0.4 mg/dL (ref 0.3–1.2)
Total Protein: 8 g/dL (ref 6.5–8.1)

## 2022-12-17 LAB — URINALYSIS, COMPLETE (UACMP) WITH MICROSCOPIC
Bilirubin Urine: NEGATIVE
Glucose, UA: NEGATIVE mg/dL
Hgb urine dipstick: NEGATIVE
Ketones, ur: 5 mg/dL — AB
Leukocytes,Ua: NEGATIVE
Nitrite: NEGATIVE
Protein, ur: 30 mg/dL — AB
Specific Gravity, Urine: 1.028 (ref 1.005–1.030)
pH: 5 (ref 5.0–8.0)

## 2022-12-17 MED ORDER — SODIUM CHLORIDE 0.9 % IV SOLN
Freq: Once | INTRAVENOUS | Status: AC
  Filled 2022-12-17: qty 250

## 2022-12-17 MED ORDER — FILGRASTIM-SNDZ 480 MCG/0.8ML IJ SOSY
480.0000 ug | PREFILLED_SYRINGE | Freq: Once | INTRAMUSCULAR | Status: AC
  Administered 2022-12-17: 480 ug via SUBCUTANEOUS

## 2022-12-17 MED ORDER — FILGRASTIM-SNDZ 480 MCG/0.8ML IJ SOSY: 480.0000 ug | PREFILLED_SYRINGE | Freq: Once | INTRAMUSCULAR | Status: DC

## 2022-12-17 MED ORDER — HEPARIN SOD (PORK) LOCK FLUSH 100 UNIT/ML IV SOLN
500.0000 [IU] | Freq: Once | INTRAVENOUS | Status: AC
  Administered 2022-12-17: 500 [IU] via INTRAVENOUS
  Filled 2022-12-17: qty 5

## 2022-12-17 MED ORDER — ZARXIO 480 MCG/0.8ML IJ SOSY: 480.0000 ug | PREFILLED_SYRINGE | Freq: Every day | INTRAMUSCULAR | 0 refills | Status: AC

## 2022-12-17 MED ORDER — CIPROFLOXACIN HCL 500 MG PO TABS: 500.0000 mg | ORAL_TABLET | Freq: Two times a day (BID) | ORAL | 0 refills | Status: AC

## 2022-12-17 MED ORDER — SODIUM CHLORIDE 0.9% FLUSH
10.0000 mL | Freq: Once | INTRAVENOUS | Status: AC
  Administered 2022-12-17: 10 mL via INTRAVENOUS
  Filled 2022-12-17: qty 10

## 2022-12-17 NOTE — Progress Notes (Signed)
Educated patient and instructions given on how to administer Zarxio at home. Pt verbalized understanding

## 2022-12-17 NOTE — Patient Instructions (Addendum)
Filgrastim Injection What is this medication? FILGRASTIM (fil GRA stim) lowers the risk of infection in people who are receiving chemotherapy. It works by Building control surveyor make more white blood cells, which protects your body from infection. It may also be used to help people who have been exposed to high doses of radiation. It can be used to help prepare your body before a stem cell transplant. It works by helping your bone marrow make and release stem cells into the blood. This medicine may be used for other purposes; ask your health care provider or pharmacist if you have questions. COMMON BRAND NAME(S): Neupogen, Nivestym, Releuko, Zarxio What should I tell my care team before I take this medication? They need to know if you have any of these conditions: History of blood diseases, such as sickle cell anemia Kidney disease Recent or ongoing radiation An unusual or allergic reaction to filgrastim, pegfilgrastim, latex, rubber, other medications, foods, dyes, or preservatives Pregnant or trying to get pregnant Breast-feeding How should I use this medication? This medication is injected under the skin or into a vein. It is usually given by your care team in a hospital or clinic setting. It may be given at home. If you get this medication at home, you will be taught how to prepare and give it. Use exactly as directed. Take it as directed on the prescription label at the same time every day. Keep taking it unless your care team tells you to stop. It is important that you put your used needles and syringes in a special sharps container. Do not put them in a trash can. If you do not have a sharps container, call your pharmacist or care team to get one. This medication comes with INSTRUCTIONS FOR USE. Ask your pharmacist for directions on how to use this medication. Read the information carefully. Talk to your pharmacist or care team if you have questions. Talk to your care team about the use of this  medication in children. While it may be prescribed for children for selected conditions, precautions do apply. Overdosage: If you think you have taken too much of this medicine contact a poison control center or emergency room at once. NOTE: This medicine is only for you. Do not share this medicine with others. What if I miss a dose? It is important not to miss any doses. Talk to your care team about what to do if you miss a dose. What may interact with this medication? Medications that may cause a release of neutrophils, such as lithium This list may not describe all possible interactions. Give your health care provider a list of all the medicines, herbs, non-prescription drugs, or dietary supplements you use. Also tell them if you smoke, drink alcohol, or use illegal drugs. Some items may interact with your medicine. What should I watch for while using this medication? Your condition will be monitored carefully while you are receiving this medication. You may need bloodwork while taking this medication. Talk to your care team about your risk of cancer. You may be more at risk for certain types of cancer if you take this medication. What side effects may I notice from receiving this medication? Side effects that you should report to your care team as soon as possible: Allergic reactions--skin rash, itching, hives, swelling of the face, lips, tongue, or throat Capillary leak syndrome--stomach or muscle pain, unusual weakness or fatigue, feeling faint or lightheaded, decrease in the amount of urine, swelling of the ankles, hands, or  feet, trouble breathing High white blood cell level--fever, fatigue, trouble breathing, night sweats, change in vision, weight loss Inflammation of the aorta--fever, fatigue, back, chest, or stomach pain, severe headache Kidney injury (glomerulonephritis)--decrease in the amount of urine, red or dark brown urine, foamy or bubbly urine, swelling of the ankles, hands, or  feet Shortness of breath or trouble breathing Spleen injury--pain in upper left stomach or shoulder Unusual bruising or bleeding Side effects that usually do not require medical attention (report to your care team if they continue or are bothersome): Back pain Bone pain Fatigue Fever Headache Nausea This list may not describe all possible side effects. Call your doctor for medical advice about side effects. You may report side effects to FDA at 1-800-FDA-1088. Where should I keep my medication? Keep out of the reach of children and pets. Keep this medication in the original packaging until you are ready to take it. Protect from light. See product for storage information. Each product may have different instructions. Get rid of any unused medication after the expiration date. To get rid of medications that are no longer needed or have expired: Take the medication to a medications take-back program. Check with your pharmacy or law enforcement to find a location. If you cannot return the medication, ask your pharmacist or care team how to get rid of this medication safely. NOTE: This sheet is a summary. It may not cover all possible information. If you have questions about this medicine, talk to your doctor, pharmacist, or health care provider.  2023 Elsevier/Gold Standard (2022-03-16 00:00:00)   Neutropenia Neutropenia is a condition that occurs when you have low levels of neutrophils. Neutrophils are a type of white blood cells. They are made in the spongy center of bones (bone marrow). They fight infections. Neutrophils are your body's main defense against infections. The fewer neutrophils you have and the longer your body remains without them, the greater your risk of getting a severe infection. What are the causes? This condition can occur if your body uses up or destroys neutrophils faster than your bone marrow can make them. Neutropenia may be caused by: A bacterial or fungal  infection. Allergic disorders. Reactions to some medicines. An autoimmune disease. An enlarged spleen. This condition can also occur if your bone marrow does not produce enough neutrophils. This problem may be caused by: Cancer. Cancer treatments, such as radiation or chemotherapy. Viral infections. Medicines, such as phenytoin. Vitamin B12 deficiency. Diseases of the bone marrow. Environmental toxins, such as insecticides. What are the signs or symptoms? This condition does not usually cause symptoms. If symptoms are present, they are usually caused by an underlying infection. Symptoms of an infection may include: Fever. Chills. Swollen glands. Mouth ulcers. Cough. Rash or skin infection. Skin may be red, swollen, or painful. Abdominal or rectal pain. Frequent urination or pain or burning with urination. Because neutropenia weakens the immune system, symptoms of infection may be reduced. It is important to be aware of any changes in your body and talk to your health care provider. How is this diagnosed? This condition is diagnosed based on your medical history and a physical exam. Tests will also be done, such as: A complete blood count (CBC). Bone marrow biopsy. This is collecting a sample of bone marrow for testing. A chest X-ray. A urine culture. A blood culture. How is this treated? Treatment depends on the underlying cause and severity of your condition. Mild neutropenia may not require treatment. Treatment may include medicines, such as:  Antibiotic medicine given through an IV. Antiviral medicines. Antifungal medicines. A medicine to increase production of neutrophils (colony-stimulating factor). You may get this medicine through an IV or by injection. Steroids given through an IV. If an underlying condition is causing neutropenia, you may need treatment for that condition. If medicines or cancer treatments are causing neutropenia, your health care provider may have you  stop the medicines or treatment. Follow these instructions at home: Medicines  Take over-the-counter and prescription medicines only as told by your health care provider. Get an annual flu shot. Ask your health care provider whether you or anyone you live with needs any other vaccines. Eating and drinking Do not share food utensils. Do not eat unpasteurized foods. Do not eat raw or undercooked meat, eggs, or seafood. Do not eat unwashed, raw fruits or vegetables. Lifestyle Avoid exposure to groups of people or children. Avoid being around people who are sick. Avoid being around live plants or fresh flowers. Avoid being around dirt or dust, such as in construction areas or gardens. Wear gloves if you are going to do yard work or gardening. Do not provide direct care for pets. Avoid animal droppings. Do not clean litter boxes and bird cages. Do not have sex unless your health care provider has approved. Hygiene  Bathe daily. Clean the area between the genitals and the anus (perineal area) after you urinate or have a bowel movement. If you are female, wipe from front to back. Get regular dental care and brush your teeth with a soft toothbrush before and after meals. Do not use a regular razor. Use an electric razor to remove hair. Wash your hands often with soap and water for at least 20 seconds. Make sure others who come in contact with you also wash their hands. If soap and water are not available, use hand sanitizer. General instructions Take steps to reduce your risk of injury or infection. Follow any precautions as told by your health care provider. Take actions to avoid cuts and burns. For example: Be cautious when you use knives. Always cut away from yourself. Keep knives in protective sheaths or guards when not in use. Use oven mitts when you cook with a hot stove, oven, or grill. Stand a safe distance away from open fires. Do not use tampons, enemas, or rectal suppositories  unless your health care provider has approved. Keep all follow-up visits. This is important. Contact a health care provider if: You have a cough. You have a sore throat. You develop sores in your mouth or anus. You have a warm, red, or tender area on your skin. You have red streaks on the skin. You develop a rash. You have swollen lymph nodes. You have frequent or painful urination. You have vaginal discharge or itching. Get help right away if: You have a fever. You have chills or shaking. You have nausea or vomiting. You have a lot of fatigue. You have shortness of breath. Summary Neutropenia is a condition that occurs when you have a lower-than-normal level of a type of white blood cell (neutrophils) in your body. This condition can occur if your body uses up or destroys neutrophils faster than your bone marrow can make them. Treatment depends on the underlying cause and severity of your condition. Mild neutropenia may not require treatment. Follow any precautions as told by your health care provider to reduce your risk for injury or infection. This information is not intended to replace advice given to you by your health care  provider. Make sure you discuss any questions you have with your health care provider. Document Revised: 06/03/2021 Document Reviewed: 06/03/2021 Elsevier Patient Education  [place].

## 2022-12-17 NOTE — Telephone Encounter (Signed)
Confirmed with [facility] that Zarxio rx was received and currently in the insurance verification process.  I requested that a note be entered to alert importance of need for home delivery over the weekend.  [facility] representative agreed to note importance but can submit expedited request due to no history of pt receiving medication from them prior.    Dr. B informed tat [facility] can't guarantee delivery of Zarxio over the weekend.  I also called patient to give her an update as well.

## 2022-12-17 NOTE — Progress Notes (Unsigned)
[facility] CONSULT NOTE  Patient Care Team: Sowles, [name], MD as PCP - General (Family Medicine) [name], Jeffrey W, MD (General Surgery) Vaught, Creighton, MD as Referring Physician (Otolaryngology) Callwood, Dwayne D, MD as Consulting Physician (Cardiology) Benitez-Graham, Ana, MD as Referring Physician (Dermatology) O'Connell, Thomas L, MD as Consulting Physician (Internal Medicine) Brahmanday, Govinda R, MD as Consulting Physician (Internal Medicine)  CHIEF COMPLAINTS/PURPOSE OF CONSULTATION: Breast cancer  #  Oncology History Overview Note  She has a remote history of right breast cancer, T1bN0, ER positive,s/p lumpectomy and MammoSite radiation and 5 years of Arimidex.    02/19/22 screening mammogram bilaterally showed  1. Suspicious right breast mass at the 10 o'clock position 8 cm from the nipple. Recommend ultrasound-guided biopsy. 2. Indeterminate right breast mass at the 6 o'clock position 4 cm from the nipple. Recommend ultrasound-guided biopsy. 3. No suspicious right axillary lymphadenopathy.   04/12/2022 Unilateral right breast diagnostic mammogram showed 1. Suspicious right breast mass at the 10 o'clock position 8 cm from the nipple. Recommend ultrasound-guided biopsy. 2. Indeterminate right breast mass at the 6 o'clock position 4 cm from the nipple. Recommend ultrasound-guided biopsy. 3. No suspicious right axillary lymphadenopathy.   04/13/2022 right breast mass 6 o'clock position 1 cm from the nipple showed invasive mammary carcinoma, no special type. Grade 3, DCIS present high grade, LVI not identified. ER-, PR 1-10%, HER 2 -   04/13/2022, right breast mass 10:00 7 cm from nipple biopsy showed invasive mammary carcinoma, grade 3, high-grade DCIS with focal comedonecrosis, lymphovascular invasion not identified.  ER -, PR 1 to 10%, HER2-    04/26/2022, right breast mastectomy with axillary dissection Invasive mammary carcinoma, no special type, multifocal, DCIS  high-grade, benign nipple/areola.  2 deposits of invasive mammary carcinoma, no definite residual lymph node identified.   Breast cancer (HCC)  05/10/2022 Initial Diagnosis   Breast cancer (HCC)   05/10/2022 Cancer Staging   Staging form: Breast, AJCC 8th Edition - Pathologic stage from 05/10/2022: Stage IIA (pT1c, pN1a, cM0, G3, ER-, PR+, HER2-) - Signed by Yu, Zhou, MD on 05/10/2022 Stage prefix: Initial diagnosis Histologic grading system: 3 grade system   05/24/2022 - 05/24/2022 Chemotherapy   Patient is on Treatment Plan : BREAST Pembrolizumab (200) D1 + Carboplatin (5) D1 + Paclitaxel (80) D1,8,15 q21d X 4 cycles / Pembrolizumab (200) D1 + AC D1 q21d x 4 cycles     05/27/2022 - 08/13/2022 Chemotherapy   Patient is on Treatment Plan : BREAST Pembrolizumab (200) D1 + Carboplatin (5) D1 + Paclitaxel (80) D1,8,15 q21d X 4 cycles / Pembrolizumab (200) D1 + AC D1 q21d x 4 cycles     05/27/2022 -  Chemotherapy   Patient is on Treatment Plan : BREAST Pembrolizumab (200) D1 + Carboplatin (5) D1 + Paclitaxel (80) D1,8,15 q21d X 4 cycles / Pembrolizumab (200) D1 + AC D1 q21d x 4 cycles     Carcinoma of upper-outer quadrant of right breast in female, estrogen receptor negative (HCC)  05/26/2022 Initial Diagnosis   Carcinoma of upper-outer quadrant of right breast in female, estrogen receptor negative (HCC)   05/26/2022 Cancer Staging   Staging form: Breast, AJCC 8th Edition - Pathologic: Stage IIA (pT1c, pN1, cM0, G3, ER-, PR-, HER2-) - Signed by Brahmanday, Govinda R, MD on 05/26/2022 Multigene prognostic tests performed: None Histologic grading system: 3 grade system   05/27/2022 -  Chemotherapy   Patient is on Treatment Plan : BREAST Pembrolizumab (200) D1 + Carboplatin (5) D1 + Paclitaxel (80)   D1,8,15 q21d X 4 cycles / Pembrolizumab (200) D1 + AC D1 q21d x 4 cycles       HISTORY OF PRESENTING ILLNESS: Ambulating independently.  Alone.   Morgan Peterson 71 y.o.  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  Patient is currently status post cycle #4 of Adriamycin Cytoxan-Keytruda.She was originally scheduled for labs and IVF for her history of neutropenia and dehydration post infusions. She reports that for the past few days she has noticed some fatigue and continued decreased appetite. She denies any fevers, chills, dysuria, skin infections, cough or signs of infection.   Review of Systems  Constitutional:  Positive for malaise/fatigue. Negative for chills, diaphoresis, fever and weight loss.  HENT:  Negative for nosebleeds and sore throat.   Eyes:  Negative for double vision.  Respiratory:  Negative for cough, hemoptysis, sputum production, shortness of breath and wheezing.   Cardiovascular:  Negative for chest pain, palpitations, orthopnea and leg swelling.  Gastrointestinal:  Negative for abdominal pain, blood in stool, constipation, diarrhea, heartburn, melena, nausea and vomiting.  Genitourinary:  Negative for dysuria, frequency and urgency.  Musculoskeletal:  Negative for back pain and joint pain.  Skin: Negative.  Negative for itching and rash.  Neurological:  Positive for tingling. Negative for dizziness, focal weakness, weakness and headaches.  Endo/Heme/Allergies:  Does not bruise/bleed easily.  Psychiatric/Behavioral:  Negative for depression. The patient is not nervous/anxious and does not have insomnia.      MEDICAL HISTORY:  Past Medical History:  Diagnosis Date   Appetite loss    Asthma    Cervical radiculopathy    Chemotherapy-induced nausea    Hiatal hernia    Hypercholesterolemia    Hyperlipidemia    Hypertension    IBS (irritable bowel syndrome)    Malignant neoplasm of upper-outer quadrant of female breast ([place]) 04/11/2009   Right breast, invasive ductal carcinoma, 0.7 cm, low grade, T1b, N0, M0 ER 90%, PR 15%, HER-2/neu 1+  low Oncotype and recurrent score. Arimidex therapy completed September 2015   Mild mitral valve prolapse    Neuropathy    feet, from chemo   Personal history of malignant neoplasm of breast 2010   Personal history of radiation therapy 2010   mammosite   T2DM (type 2 diabetes mellitus) ([place]) 2011    SURGICAL HISTORY: Past Surgical History:  Procedure Laterality Date   ABDOMINAL EXPLORATION SURGERY  2000   ovarian cyst   BREAST BIOPSY Right 2010   +    BREAST EXCISIONAL BIOPSY Right 2010   + mammo site inasive mammo ca   BREAST LUMPECTOMY Right 2010   [facility]   COLONOSCOPY  07/19/2012   Normal exam, Dr. Bary Castilla   COLONOSCOPY WITH PROPOFOL N/A 04/21/2022   Procedure: COLONOSCOPY WITH PROPOFOL;  Surgeon: Robert Bellow, MD;  Location: [facility] END[facility];  Service: Endoscopy;  Laterality: N/A;   ESOPHAGOGASTRODUODENOSCOPY (EGD) WITH PROPOFOL N/A 10/22/2022   Procedure: ESOPHAGOGASTRODUODENOSCOPY (EGD) WITH PROPOFOL;  Surgeon: Lucilla Lame, MD;  Location: [place];  Service: Endoscopy;  Laterality: N/A;  Diabetic   MASTECTOMY Right 2010   partial/ lumpectomy   PORTACATH PLACEMENT Left 05/19/2022   Procedure: INSERTION PORT-A-CATH;  Surgeon: Robert Bellow, MD;  Location: [facility] ORS;  Service: General;  Laterality: Left;   SIMPLE MASTECTOMY WITH AXILLARY SENTINEL NODE BIOPSY Right 04/26/2022   Procedure: SIMPLE MASTECTOMY WITH AXILLARY SENTINEL NODE BIOPSY;  Surgeon: Bary Castilla,  Forest Gleason, MD;  Location: [facility] OR[facility];  Service: General;  Laterality: Right;  RNFA to assist   TUBAL LIGATION  20 years ago    SOCIAL HISTORY: Social History   Socioeconomic History   Marital status: Married    Spouse name: Antonio   Number of children: 2   Years of education: Not on file   Highest education level: Not on file  Occupational History   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  Tobacco Use   Smoking status: Former    Packs/day: 0.25    Years: 10.00    Total pack years: 2.50     Types: Cigarettes    Start date: 1978    Quit date: 1988    Years since quitting: 36.0   Smokeless tobacco: Never   Tobacco comments:    smoking cessation materials not required  Vaping Use   Vaping Use: Never used  Substance and Sexual Activity   Alcohol use: Yes    Comment: wine occ   Drug use: No   Sexual activity: Not Currently    Comment: husband has ED  Other Topics Concern   Not on file  Social History Narrative   Oncology nurse on the floor retired.;  Husband retired from [employer].  No smoking.   Social Determinants of Health   Financial Resource Strain: Low Risk  (12/31/2021)   Overall Financial Resource Strain (CARDIA)    Difficulty of Paying Living Expenses: Not hard at all  Food Insecurity: No Food Insecurity (12/31/2021)   Hunger Vital Sign    Worried About Running Out of Food in the Last Year: Never true    Ran Out of Food in the Last Year: Never true  Transportation Needs: No Transportation Needs (12/31/2021)   PRAPARE - Hydrologist (Medical): No    Lack of Transportation (Non-Medical): No  Physical Activity: Inactive (12/31/2021)   Exercise Vital Sign    Days of Exercise per Week: 0 days    Minutes of Exercise per Session: 0 min  Stress: No Stress Concern Present (12/31/2021)   [place]    Feeling of Stress : Only a little  Social Connections: Moderately Integrated (12/31/2021)   Social Connection and Isolation Panel [NHANES]    Frequency of Communication with Friends and Family: More than three times a week    Frequency of Social Gatherings with Friends and Family: Three times a week    Attends Religious Services: More than 4 times per year    Active Member of Clubs or Organizations: No    Attends Archivist Meetings: Never    Marital Status: Married  Human resources officer Violence: Not At Risk (12/31/2021)   Humiliation, Afraid, Rape, and Kick questionnaire     Fear of Current or Ex-Partner: No    Emotionally Abused: No    Physically Abused: No    Sexually Abused: No    FAMILY HISTORY: Family History  Problem Relation Age of Onset   Osteoporosis Mother    Diabetes Father    Kidney disease Father    Diabetes Brother    Breast cancer Maternal Aunt    Bladder Cancer Maternal Aunt    Cervical cancer Maternal Aunt    Colon cancer Maternal Uncle    Lung cancer Maternal Uncle     ALLERGIES:  is allergic to levaquin [levofloxacin in d5w], losartan, metoprolol, saxagliptin, amlodipine-olmesartan, canagliflozin, egg white [egg white (egg protein)],  gabapentin, glipizide, gluten meal, lisinopril, metformin and related, milk (cow), milk-related compounds, shrimp extract allergy skin test, tramadol, zyprexa [olanzapine], actos [pioglitazone], atorvastatin, carvedilol, and prednisone.  MEDICATIONS:  Current Outpatient Medications  Medication Sig Dispense Refill   aspirin EC 81 MG tablet Take 1 tablet (81 mg total) by mouth daily. 30 tablet 0   atenolol (TENORMIN) 25 MG tablet Take 1 tablet (25 mg total) by mouth daily. 90 tablet 0   Blood Glucose Monitoring Suppl (ONETOUCH VERIO) w/Device KIT 1 Device by Does not apply route once. 1 kit 0   cholecalciferol (VITAMIN D) 1000 UNITS tablet Take 1,000 Units by mouth daily.     ciprofloxacin (CIPRO) 500 MG tablet Take 1 tablet (500 mg total) by mouth 2 (two) times daily for 10 days. 20 tablet 0   Cyanocobalamin (VITAMIN B-12 PO) Take 1 tablet by mouth daily as needed (fatigue).     famotidine (PEPCID) 20 MG tablet Take 20 mg by mouth 2 (two) times daily.     glucose blood (ONETOUCH VERIO) test strip Use as instructed 100 each 12   Lancets (ONETOUCH ULTRASOFT) lancets Use as instructed 100 each 3   levocetirizine (XYZAL) 5 MG tablet Take 1 tablet (5 mg total) by mouth every evening. 90 tablet 1   lidocaine-prilocaine (EMLA) cream Apply 1 application. topically as needed. Apply to port and cover with saran  wrap 1-2 hours prior to port access 30 g 1   montelukast (SINGULAIR) 10 MG tablet TAKE 1 TABLET BY MOUTH EVERYDAY AT BEDTIME 90 tablet 1   Multiple Vitamin (MULTIVITAMIN WITH MINERALS) TABS tablet Take 1 tablet by mouth 2 (two) times a week.     ondansetron (ZOFRAN) 8 MG tablet Take 1 tablet (8 mg total) by mouth every 8 (eight) hours as needed for nausea or vomiting. 40 tablet 1   OZEMPIC, 0.25 OR 0.5 MG/DOSE, 2 MG/1.5ML SOPN Inject 0.5 mg into the skin every Sunday.     promethazine (PHENERGAN) 25 MG tablet Take 1 tablet (25 mg total) by mouth every 6 (six) hours as needed for nausea or vomiting. 40 tablet 0   rosuvastatin (CRESTOR) 10 MG tablet Take 1 tablet (10 mg total) by mouth every morning. 90 tablet 1   spironolactone (ALDACTONE) 50 MG tablet TAKE 1 TABLET BY MOUTH EVERY DAY 90 tablet 0   azelastine (ASTELIN) 0.1 % nasal spray Place 2 sprays into both nostrils daily as needed for allergies or rhinitis. Use in each nostril as directed (Patient not taking: Reported on 11/01/2022)     betamethasone, augmented, (DIPROLENE) 0.05 % lotion Apply 1 application topically 2 (two) times daily. (Patient not taking: Reported on 10/12/2022)     dronabinol (MARINOL) 2.5 MG capsule Take 1 capsule (2.5 mg total) by mouth daily before lunch. (Patient not taking: Reported on 11/01/2022) 60 capsule 0   EPINEPHrine (EPIPEN 2-PAK) 0.3 mg/0.3 mL IJ SOAJ injection Inject 0.3 mg into the muscle as needed for anaphylaxis. (Patient not taking: Reported on 11/01/2022) 2 each 0   magnesium oxide (MAG-OX) 400 (240 Mg) MG tablet Take 1 tablet (400 mg total) by mouth daily. (Patient not taking: Reported on 11/01/2022) 15 tablet 0   pantoprazole (PROTONIX) 40 MG tablet Take 1 tablet (40 mg total) by mouth daily. (Patient not taking: Reported on 11/22/2022) 14 tablet 0   rizatriptan (MAXALT-MLT) 10 MG disintegrating tablet Take 5-10 mg by mouth as needed for migraine. May repeat in 2 hours if needed (Patient not taking:  Reported on 11/01/2022)  Current Facility-Administered Medications  Medication Dose Route Frequency Provider Last Rate Last Admin   filgrastim-sndz [facility]) injection 480 mcg  480 mcg Subcutaneous Once Rayjon Wery M, PA-C       Facility-Administered Medications Ordered in Other Visits  Medication Dose Route Frequency Provider Last Rate Last Admin   heparin lock flush 100 unit/mL  500 Units Intravenous Once Charlaine Dalton R, MD       [COMPLETED] heparin lock flush 100 unit/mL  500 Units Intravenous Once Cammie Sickle, MD   500 Units at 12/17/22 1544      .  PHYSICAL EXAMINATION: ECOG PERFORMANCE STATUS: 0 - Asymptomatic  Vitals:   12/17/22 1338  BP: (!) 134/91  Pulse: 93  Temp: 98.8 F (37.1 C)    Filed Weights   12/17/22 1338  Weight: 171 lb (77.6 kg)     Physical Exam Vitals and nursing note reviewed.  HENT:     Head: Normocephalic and atraumatic.     Mouth/Throat:     Pharynx: Oropharynx is clear.  Eyes:     Extraocular Movements: Extraocular movements intact.     Pupils: Pupils are equal, round, and reactive to light.  Cardiovascular:     Rate and Rhythm: Normal rate and regular rhythm.  Pulmonary:     Comments: Decreased breath sounds bilaterally.  Abdominal:     Palpations: Abdomen is soft.  Musculoskeletal:        General: Normal range of motion.     Cervical back: Normal range of motion.  Skin:    General: Skin is warm.  Neurological:     General: No focal deficit present.     Mental Status: She is alert and oriented to person, place, and time.  Psychiatric:        Behavior: Behavior normal.        Judgment: Judgment normal.      LABORATORY DATA:  I have reviewed the data as listed Lab Results  Component Value Date   WBC 0.3 (LL) 12/17/2022   HGB 9.6 (L) 12/17/2022   HCT 29.7 (L) 12/17/2022   MCV 87.9 12/17/2022   PLT 39 (L) 12/17/2022   Recent Labs    11/22/22 0841 11/30/22 0819 12/17/22 1307  NA 141 140 136  K  3.7 3.6 4.3  CL 107 108 103  CO2 _0 GLUCOSE 109* 119* 161*  BUN 7* 16 27*  CREATININE 0.80 0.77 0.98  CALCIUM 8.7* 8.7* 9.1  GFRNONAA >60 >60 >60  PROT 6.8 7.1 8.0  ALBUMIN 3.4* 3.4* 3.4*  AST 20 27 58*  ALT 17 22 57*  ALKPHOS 73 70 64  BILITOT 0.4 0.4 0.4     RADIOGRAPHIC STUDIES: I have personally reviewed the radiological images as listed and agreed with the findings in the report. No results found.  ASSESSMENT & PLAN:   No problem-specific Assessment & Plan notes found for this encounter.      All questions were answered. The patient/family knows to call the clinic with any problems, questions or concerns.       Hughie Closs, PA-C 12/17/2022 2:55 PM

## 2022-12-19 ENCOUNTER — Other Ambulatory Visit: Payer: Self-pay | Admitting: Internal Medicine

## 2022-12-19 DIAGNOSIS — E1169 Type 2 diabetes mellitus with other specified complication: Secondary | ICD-10-CM

## 2022-12-21 ENCOUNTER — Other Ambulatory Visit: Payer: Self-pay

## 2022-12-21 ENCOUNTER — Telehealth: Payer: Self-pay | Admitting: *Deleted

## 2022-12-21 ENCOUNTER — Inpatient Hospital Stay: Payer: Medicare Other

## 2022-12-21 ENCOUNTER — Encounter: Payer: Self-pay | Admitting: Internal Medicine

## 2022-12-21 ENCOUNTER — Other Ambulatory Visit: Payer: Self-pay | Admitting: *Deleted

## 2022-12-21 ENCOUNTER — Inpatient Hospital Stay: Payer: Medicare Other | Attending: Oncology | Admitting: Nurse Practitioner

## 2022-12-21 ENCOUNTER — Other Ambulatory Visit: Payer: Self-pay | Admitting: Internal Medicine

## 2022-12-21 ENCOUNTER — Encounter: Payer: Self-pay | Admitting: Nurse Practitioner

## 2022-12-21 VITALS — BP 116/63 | HR 100 | Temp 97.4°F | Resp 20 | Ht 66.0 in | Wt 172.0 lb

## 2022-12-21 DIAGNOSIS — D6959 Other secondary thrombocytopenia: Secondary | ICD-10-CM

## 2022-12-21 DIAGNOSIS — Z7982 Long term (current) use of aspirin: Secondary | ICD-10-CM | POA: Insufficient documentation

## 2022-12-21 DIAGNOSIS — C50411 Malignant neoplasm of upper-outer quadrant of right female breast: Secondary | ICD-10-CM | POA: Insufficient documentation

## 2022-12-21 DIAGNOSIS — Z923 Personal history of irradiation: Secondary | ICD-10-CM | POA: Diagnosis not present

## 2022-12-21 DIAGNOSIS — D6481 Anemia due to antineoplastic chemotherapy: Secondary | ICD-10-CM | POA: Diagnosis not present

## 2022-12-21 DIAGNOSIS — E1165 Type 2 diabetes mellitus with hyperglycemia: Secondary | ICD-10-CM | POA: Diagnosis not present

## 2022-12-21 DIAGNOSIS — D709 Neutropenia, unspecified: Secondary | ICD-10-CM

## 2022-12-21 DIAGNOSIS — Z79899 Other long term (current) drug therapy: Secondary | ICD-10-CM | POA: Diagnosis not present

## 2022-12-21 DIAGNOSIS — Z5112 Encounter for antineoplastic immunotherapy: Secondary | ICD-10-CM | POA: Insufficient documentation

## 2022-12-21 DIAGNOSIS — T451X5A Adverse effect of antineoplastic and immunosuppressive drugs, initial encounter: Secondary | ICD-10-CM | POA: Insufficient documentation

## 2022-12-21 DIAGNOSIS — Z171 Estrogen receptor negative status [ER-]: Secondary | ICD-10-CM | POA: Diagnosis not present

## 2022-12-21 DIAGNOSIS — D701 Agranulocytosis secondary to cancer chemotherapy: Secondary | ICD-10-CM | POA: Diagnosis not present

## 2022-12-21 DIAGNOSIS — C50811 Malignant neoplasm of overlapping sites of right female breast: Secondary | ICD-10-CM | POA: Diagnosis present

## 2022-12-21 LAB — CBC WITH DIFFERENTIAL/PLATELET
Abs Immature Granulocytes: 0.49 10*3/uL — ABNORMAL HIGH (ref 0.00–0.07)
Basophils Absolute: 0.1 10*3/uL (ref 0.0–0.1)
Basophils Relative: 3 %
Eosinophils Absolute: 0 10*3/uL (ref 0.0–0.5)
Eosinophils Relative: 0 %
HCT: 29.1 % — ABNORMAL LOW (ref 36.0–46.0)
Hemoglobin: 9.5 g/dL — ABNORMAL LOW (ref 12.0–15.0)
Immature Granulocytes: 18 %
Lymphocytes Relative: 11 %
Lymphs Abs: 0.3 10*3/uL — ABNORMAL LOW (ref 0.7–4.0)
MCH: 28.8 pg (ref 26.0–34.0)
MCHC: 32.6 g/dL (ref 30.0–36.0)
MCV: 88.2 fL (ref 80.0–100.0)
Monocytes Absolute: 0.4 10*3/uL (ref 0.1–1.0)
Monocytes Relative: 16 %
Neutro Abs: 1.5 10*3/uL — ABNORMAL LOW (ref 1.7–7.7)
Neutrophils Relative %: 52 %
Platelets: 82 10*3/uL — ABNORMAL LOW (ref 150–400)
RBC: 3.3 MIL/uL — ABNORMAL LOW (ref 3.87–5.11)
RDW: 14.9 % (ref 11.5–15.5)
Smear Review: NORMAL
WBC: 2.8 10*3/uL — ABNORMAL LOW (ref 4.0–10.5)
nRBC: 2.2 % — ABNORMAL HIGH (ref 0.0–0.2)

## 2022-12-21 LAB — COMPREHENSIVE METABOLIC PANEL
ALT: 33 U/L (ref 0–44)
AST: 26 U/L (ref 15–41)
Albumin: 3.3 g/dL — ABNORMAL LOW (ref 3.5–5.0)
Alkaline Phosphatase: 65 U/L (ref 38–126)
Anion gap: 12 (ref 5–15)
BUN: 11 mg/dL (ref 8–23)
CO2: 21 mmol/L — ABNORMAL LOW (ref 22–32)
Calcium: 8.8 mg/dL — ABNORMAL LOW (ref 8.9–10.3)
Chloride: 104 mmol/L (ref 98–111)
Creatinine, Ser: 0.94 mg/dL (ref 0.44–1.00)
GFR, Estimated: >60 mL/min (ref >60)
Glucose, Bld: 178 mg/dL — ABNORMAL HIGH (ref 70–99)
Potassium: 4 mmol/L (ref 3.5–5.1)
Sodium: 137 mmol/L (ref 135–145)
Total Bilirubin: 0.5 mg/dL (ref 0.3–1.2)
Total Protein: 7.1 g/dL (ref 6.5–8.1)

## 2022-12-21 MED ORDER — HEPARIN SOD (PORK) LOCK FLUSH 100 UNIT/ML IV SOLN
500.0000 [IU] | Freq: Once | INTRAVENOUS | Status: AC
  Administered 2022-12-21: 500 [IU] via INTRAVENOUS
  Filled 2022-12-21: qty 5

## 2022-12-21 MED ORDER — SODIUM CHLORIDE 0.9% FLUSH
10.0000 mL | Freq: Once | INTRAVENOUS | Status: AC
  Administered 2022-12-21: 10 mL via INTRAVENOUS
  Filled 2022-12-21: qty 10

## 2022-12-21 MED ORDER — FILGRASTIM-SNDZ 480 MCG/0.8ML IJ SOSY
480.0000 ug | PREFILLED_SYRINGE | Freq: Once | INTRAMUSCULAR | Status: AC
  Administered 2022-12-21: 480 ug via SUBCUTANEOUS
  Filled 2022-12-21: qty 0.8

## 2022-12-21 NOTE — Progress Notes (Signed)
Symptom Management [place] [facility] [place]. [facility] [address] [place], [place] [address] [phone] ([phone] (fax)  Patient Care Team: Steele Sizer, MD as PCP - General (Family Medicine) Bary Castilla, Forest Gleason, MD (General Surgery) Carloyn Manner, MD as Referring Physician (Otolaryngology) Yolonda Kida, MD as Consulting Physician (Cardiology) Ree Edman, MD as Referring Physician (Dermatology) Lonia Farber, MD as Consulting Physician (Internal Medicine) Cammie Sickle, MD as Consulting Physician (Internal Medicine)   Name of the patient: Morgan Peterson  [id number]  [birth year]   Date of visit: 12/21/22  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    02/19/22 screening mammogram bilaterally showed  1. Suspicious right breast mass at the 10 o'clock position 8 cm from the nipple. Recommend ultrasound-guided biopsy. 2. Indeterminate right breast mass at the 6 o'clock position 4 cm from the nipple. Recommend ultrasound-guided biopsy. 3. No suspicious right axillary lymphadenopathy.   04/12/2022 Unilateral right breast diagnostic mammogram showed 1. Suspicious right breast mass at the 10 o'clock position 8 cm from the nipple. Recommend ultrasound-guided biopsy. 2. Indeterminate right breast mass at the 6 o'clock position 4 cm from the nipple. Recommend ultrasound-guided biopsy. 3. No suspicious right axillary lymphadenopathy.   04/13/2022 right breast mass 6 o'clock position 1 cm from the nipple showed invasive mammary carcinoma, no special type. Grade 3, DCIS present high grade, LVI not identified. ER-, PR 1-10%, HER 2 -    04/13/2022, right breast mass 10:00 7 cm from nipple biopsy showed invasive mammary carcinoma, grade 3, high-grade DCIS with focal comedonecrosis, lymphovascular invasion not identified.  ER -, PR 1 to 10%, HER2-    04/26/2022, right breast mastectomy with axillary dissection Invasive mammary carcinoma, no special type, multifocal, DCIS high-grade, benign nipple/areola.  2 deposits of invasive mammary carcinoma, no definite residual lymph node identified.   Breast cancer ([place])  05/10/2022 Initial Diagnosis   Breast cancer ([place])   05/10/2022 Cancer Staging   Staging form: Breast, AJCC 8th Edition - Pathologic stage from 05/10/2022: Stage IIA (pT1c, pN1a, cM0, G3, ER-, PR+, HER2-) - Signed by Earlie Server, MD on 05/10/2022 Stage prefix: Initial diagnosis Histologic grading system: 3 grade system   05/24/2022 - 05/24/2022 Chemotherapy   Patient is on Treatment Plan : BREAST Pembrolizumab (200) D1 + Carboplatin (5) D1 + Paclitaxel (80) D1,8,15 q21d X 4 cycles / Pembrolizumab (200) D1 + AC D1 q21d x 4 cycles     05/27/2022 - 08/13/2022 Chemotherapy   Patient is on Treatment Plan : BREAST Pembrolizumab (200) D1 + Carboplatin (5) D1 + Paclitaxel (80) D1,8,15 q21d X 4 cycles / Pembrolizumab (200) D1 + AC D1 q21d x 4 cycles     05/27/2022 -  Chemotherapy   Patient is on Treatment Plan : BREAST Pembrolizumab (200) D1 + Carboplatin (5) D1 + Paclitaxel (80) D1,8,15 q21d X 4 cycles / Pembrolizumab (200) D1 + AC D1 q21d x 4 cycles     Carcinoma of upper-outer quadrant of right breast in female, estrogen receptor negative ([place])  05/26/2022 Initial Diagnosis   Carcinoma of upper-outer quadrant of right breast in female, estrogen receptor negative ([place])   05/26/2022 Cancer Staging   Staging form: Breast, AJCC 8th Edition - Pathologic: Stage  IIA (pT1c, pN1, cM0, G3, ER-, PR-, HER2-) - Signed by Cammie Sickle, MD on 05/26/2022 Multigene prognostic tests performed: None Histologic grading system: 3 grade system    05/27/2022 -  Chemotherapy   Patient is on Treatment Plan : BREAST Pembrolizumab (200) D1 + Carboplatin (5) D1 + Paclitaxel (80) D1,8,15 q21d X 4 cycles / Pembrolizumab (200) D1 + AC D1 q21d x 4 cycles       Interval history- Patient is 72 year old female diagnosed with triple negative breast cancer, s/p 4 cycles of adjuvant adriamycin-cytoxan- keytruda who returns to clinic for evaluation and consideration of zarxio. She has received 4 doses of zarxio. Had some congestion over the holiday but no fevers or chills. Eating and drinking   Review of systems- Review of Systems  Constitutional:  Negative for chills, fever, malaise/fatigue and weight loss.  HENT:  Negative for hearing loss, nosebleeds, sore throat and tinnitus.   Eyes:  Negative for blurred vision and double vision.  Respiratory:  Negative for cough, hemoptysis, shortness of breath and wheezing.   Cardiovascular:  Negative for chest pain, palpitations and leg swelling.  Gastrointestinal:  Negative for abdominal pain, blood in stool, constipation, diarrhea, melena, nausea and vomiting.  Genitourinary:  Negative for dysuria and urgency.  Musculoskeletal:  Negative for back pain, falls, joint pain and myalgias.  Skin:  Negative for itching and rash.  Neurological:  Negative for dizziness, tingling, sensory change, loss of consciousness, weakness and headaches.  Endo/Heme/Allergies:  Negative for environmental allergies. Does not bruise/bleed easily.  Psychiatric/Behavioral:  Negative for depression. The patient is not nervous/anxious and does not have insomnia.     Current treatment- A-C w/ keytruda  Allergies  Allergen Reactions   Levaquin [Levofloxacin In D5w] Swelling    Other reaction(s): Arthralgia (Joint Pain)   Losartan     Other reaction(s): Angioedema   Metoprolol Swelling    Lip and tongue tingling and numbness   Saxagliptin Other (See Comments)    Increased PVC's  Other reaction(s): irregular heart rate    Amlodipine-Olmesartan    Canagliflozin Other (See Comments)    Bladder pain   Egg White [Egg White (Egg Protein)]     Unknown reaction   Gabapentin     cns side effects.   Glipizide     Other reaction(s): Abdominal pain   Gluten Meal    Lisinopril Swelling    Angioedema   Metformin And Related Nausea Only   Milk (Cow)    Milk-Related Compounds    Shrimp Extract Allergy Skin Test     Other reaction(s): Other (See Comments)   Tramadol Nausea And Vomiting   Zyprexa [Olanzapine]    Actos [Pioglitazone] Other (See Comments) and Nausea And Vomiting    Bladder pain   Atorvastatin Other (See Comments)    Other reaction(s): Abdominal Pain, Other (See Comments)   Carvedilol Other (See Comments)    NUMBNESS, TINGLING, ACHING IN ARMS   Prednisone Palpitations    Past Medical History:  Diagnosis Date   Appetite loss    Asthma    Cervical radiculopathy    Chemotherapy-induced nausea    Hiatal hernia    Hypercholesterolemia    Hyperlipidemia    Hypertension    IBS (irritable bowel syndrome)    Malignant neoplasm of upper-outer quadrant of female breast ([place]) 04/11/2009   Right breast, invasive ductal carcinoma, 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 Arimidex therapy completed September  2015   Mild mitral valve prolapse    Neuropathy    feet, from chemo   Personal history of malignant neoplasm of breast 2010   Personal history of radiation therapy 2010   mammosite   T2DM (type 2 diabetes mellitus) ([place]) 2011    Past Surgical History:  Procedure Laterality Date   ABDOMINAL EXPLORATION SURGERY  2000   ovarian cyst   BREAST BIOPSY Right 2010   +    BREAST EXCISIONAL BIOPSY Right 2010   + mammo site inasive mammo ca   BREAST LUMPECTOMY Right 2010   [facility]   COLONOSCOPY  07/19/2012   Normal exam, Dr. Bary Castilla   COLONOSCOPY WITH PROPOFOL N/A 04/21/2022   Procedure: COLONOSCOPY WITH PROPOFOL;  Surgeon: Robert Bellow, MD;   Location: [facility] END[facility];  Service: Endoscopy;  Laterality: N/A;   ESOPHAGOGASTRODUODENOSCOPY (EGD) WITH PROPOFOL N/A 10/22/2022   Procedure: ESOPHAGOGASTRODUODENOSCOPY (EGD) WITH PROPOFOL;  Surgeon: Lucilla Lame, MD;  Location: [place];  Service: Endoscopy;  Laterality: N/A;  Diabetic   MASTECTOMY Right 2010   partial/ lumpectomy   PORTACATH PLACEMENT Left 05/19/2022   Procedure: INSERTION PORT-A-CATH;  Surgeon: Robert Bellow, MD;  Location: [facility] ORS;  Service: General;  Laterality: Left;   SIMPLE MASTECTOMY WITH AXILLARY SENTINEL NODE BIOPSY Right 04/26/2022   Procedure: SIMPLE MASTECTOMY WITH AXILLARY SENTINEL NODE BIOPSY;  Surgeon: Robert Bellow, MD;  Location: [facility] ORS;  Service: General;  Laterality: Right;  RNFA to assist   TUBAL LIGATION  20 years ago    Social History   Socioeconomic History   Marital status: Married    Spouse name: Antonio   Number of children: 2   Years of education: Not on file   Highest education level: Not on file  Occupational History   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  Tobacco Use   Smoking status: Former    Packs/day: 0.25    Years: 10.00    Total pack years: 2.50    Types: Cigarettes    Start date: 1978    Quit date: 1988    Years since quitting: 36.0   Smokeless tobacco: Never   Tobacco comments:    smoking cessation materials not required  Vaping Use   Vaping Use: Never used  Substance and Sexual Activity   Alcohol use: Yes    Comment: wine occ   Drug use: No   Sexual activity: Not Currently    Comment: husband has ED  Other Topics Concern   Not on file  Social History Narrative   Oncology nurse on the floor retired.;  Husband retired from [employer].  No smoking.   Social Determinants of Health   Financial Resource Strain: Low Risk  (12/31/2021)   Overall Financial Resource Strain (CARDIA)    Difficulty of Paying Living Expenses: Not hard at all  Food Insecurity: No Food Insecurity (12/31/2021)    Hunger Vital Sign    Worried About Running Out of Food in the Last Year: Never true    Ran Out of Food in the Last Year: Never true  Transportation Needs: No Transportation Needs (12/31/2021)   PRAPARE - Hydrologist (Medical): No    Lack of Transportation (Non-Medical): No  Physical Activity: Inactive (12/31/2021)   Exercise Vital Sign    Days of Exercise per Week: 0 days    Minutes of Exercise per Session: 0 min  Stress: No Stress Concern Present (12/31/2021)   [employer] of  Occupational Health - Occupational Stress Questionnaire    Feeling of Stress : Only a little  Social Connections: Moderately Integrated (12/31/2021)   Social Connection and Isolation Panel [NHANES]    Frequency of Communication with Friends and Family: More than three times a week    Frequency of Social Gatherings with Friends and Family: Three times a week    Attends Religious Services: More than 4 times per year    Active Member of Clubs or Organizations: No    Attends Archivist Meetings: Never    Marital Status: Married  Human resources officer Violence: Not At Risk (12/31/2021)   Humiliation, Afraid, Rape, and Kick questionnaire    Fear of Current or Ex-Partner: No    Emotionally Abused: No    Physically Abused: No    Sexually Abused: No    Family History  Problem Relation Age of Onset   Osteoporosis Mother    Diabetes Father    Kidney disease Father    Diabetes Brother    Breast cancer Maternal Aunt    Bladder Cancer Maternal Aunt    Cervical cancer Maternal Aunt    Colon cancer Maternal Uncle    Lung cancer Maternal Uncle      Current Outpatient Medications:    aspirin EC 81 MG tablet, Take 1 tablet (81 mg total) by mouth daily., Disp: 30 tablet, Rfl: 0   atenolol (TENORMIN) 25 MG tablet, Take 1 tablet (25 mg total) by mouth daily., Disp: 90 tablet, Rfl: 0   Blood Glucose Monitoring Suppl (ONETOUCH VERIO) w/Device KIT, 1 Device by Does not apply route once.,  Disp: 1 kit, Rfl: 0   cholecalciferol (VITAMIN D) 1000 UNITS tablet, Take 1,000 Units by mouth daily., Disp: , Rfl:    ciprofloxacin (CIPRO) 500 MG tablet, Take 1 tablet (500 mg total) by mouth 2 (two) times daily for 10 days., Disp: 20 tablet, Rfl: 0   Cyanocobalamin (VITAMIN B-12 PO), Take 1 tablet by mouth daily as needed (fatigue)., Disp: , Rfl:    famotidine (PEPCID) 20 MG tablet, Take 20 mg by mouth 2 (two) times daily., Disp: , Rfl:    glucose blood (ONETOUCH VERIO) test strip, Use as instructed, Disp: 100 each, Rfl: 12   Lancets (ONETOUCH ULTRASOFT) lancets, Use as instructed, Disp: 100 each, Rfl: 3   levocetirizine (XYZAL) 5 MG tablet, Take 1 tablet (5 mg total) by mouth every evening., Disp: 90 tablet, Rfl: 1   lidocaine-prilocaine (EMLA) cream, Apply 1 application. topically as needed. Apply to port and cover with saran wrap 1-2 hours prior to port access, Disp: 30 g, Rfl: 1   montelukast (SINGULAIR) 10 MG tablet, TAKE 1 TABLET BY MOUTH EVERYDAY AT BEDTIME, Disp: 90 tablet, Rfl: 1   Multiple Vitamin (MULTIVITAMIN WITH MINERALS) TABS tablet, Take 1 tablet by mouth 2 (two) times a week., Disp: , Rfl:    ondansetron (ZOFRAN) 8 MG tablet, Take 1 tablet (8 mg total) by mouth every 8 (eight) hours as needed for nausea or vomiting., Disp: 40 tablet, Rfl: 1   OZEMPIC, 0.25 OR 0.5 MG/DOSE, 2 MG/1.5ML SOPN, Inject 0.5 mg into the skin every Sunday., Disp: , Rfl:    rosuvastatin (CRESTOR) 10 MG tablet, Take 1 tablet (10 mg total) by mouth every morning., Disp: 90 tablet, Rfl: 1   spironolactone (ALDACTONE) 50 MG tablet, TAKE 1 TABLET BY MOUTH EVERY DAY, Disp: 90 tablet, Rfl: 0   azelastine (ASTELIN) 0.1 % nasal spray, Place 2 sprays into both nostrils daily as  needed for allergies or rhinitis. Use in each nostril as directed (Patient not taking: Reported on 11/01/2022), Disp: , Rfl:    betamethasone, augmented, (DIPROLENE) 0.05 % lotion, Apply 1 application topically 2 (two) times daily. (Patient not  taking: Reported on 10/12/2022), Disp: , Rfl:    dronabinol (MARINOL) 2.5 MG capsule, Take 1 capsule (2.5 mg total) by mouth daily before lunch. (Patient not taking: Reported on 11/01/2022), Disp: 60 capsule, Rfl: 0   EPINEPHrine (EPIPEN 2-PAK) 0.3 mg/0.3 mL IJ SOAJ injection, Inject 0.3 mg into the muscle as needed for anaphylaxis. (Patient not taking: Reported on 11/01/2022), Disp: 2 each, Rfl: 0   filgrastim-sndz (ZARXIO) 480 MCG/0.8ML SOSY injection, Inject 0.8 mLs (480 mcg total) into the skin daily for 4 days., Disp: 3.2 mL, Rfl: 0   magnesium oxide (MAG-OX) 400 (240 Mg) MG tablet, Take 1 tablet (400 mg total) by mouth daily. (Patient not taking: Reported on 11/01/2022), Disp: 15 tablet, Rfl: 0   promethazine (PHENERGAN) 25 MG tablet, Take 1 tablet (25 mg total) by mouth every 6 (six) hours as needed for nausea or vomiting. (Patient not taking: Reported on 12/21/2022), Disp: 40 tablet, Rfl: 0   rizatriptan (MAXALT-MLT) 10 MG disintegrating tablet, Take 5-10 mg by mouth as needed for migraine. May repeat in 2 hours if needed (Patient not taking: Reported on 11/01/2022), Disp: , Rfl:  No current facility-administered medications for this visit.  Facility-Administered Medications Ordered in Other Visits:    heparin lock flush 100 unit/mL, 500 Units, Intravenous, Once, Brahmanday, Govinda R, MD   heparin lock flush 100 unit/mL, 500 Units, Intravenous, Once, Verlon Au, NP  Physical exam:  Vitals:   12/21/22 1122  BP: 116/63  Pulse: 100  Resp: 20  Temp: (!) 97.4 F (36.3 C)  TempSrc: Tympanic  Weight: 172 lb (78 kg)  Height: _0  (1.676 m)   Physical Exam Constitutional:      General: She is not in acute distress.    Appearance: She is well-developed. She is not ill-appearing.  HENT:     Head: Atraumatic.     Nose: Nose normal.     Mouth/Throat:     Pharynx: No oropharyngeal exudate.  Eyes:     General: No scleral icterus.    Conjunctiva/sclera: Conjunctivae normal.   Cardiovascular:     Rate and Rhythm: Normal rate and regular rhythm.  Pulmonary:     Effort: Pulmonary effort is normal.     Breath sounds: Normal breath sounds.  Abdominal:     General: Bowel sounds are normal. There is no distension.     Palpations: Abdomen is soft.     Tenderness: There is no abdominal tenderness.  Musculoskeletal:        General: No tenderness or deformity. Normal range of motion.     Cervical back: Normal range of motion and neck supple.  Skin:    General: Skin is warm and dry.     Coloration: Skin is not pale.  Neurological:     General: No focal deficit present.     Mental Status: She is alert and oriented to person, place, and time.  Psychiatric:        Mood and Affect: Mood normal.        Behavior: Behavior normal.         Latest Ref Rng & Units 12/21/2022   11:06 AM  CMP  Glucose 70 - 99 mg/dL 178   BUN 8 - 23 mg/dL 11   Creatinine  0.44 - 1.00 mg/dL 0.94   Sodium 135 - 145 mmol/L 137   Potassium 3.5 - 5.1 mmol/L 4.0   Chloride 98 - 111 mmol/L 104   CO2 22 - 32 mmol/L 21   Calcium 8.9 - 10.3 mg/dL 8.8   Total Protein 6.5 - 8.1 g/dL 7.1   Total Bilirubin 0.3 - 1.2 mg/dL 0.5   Alkaline Phos 38 - 126 U/L 65   AST 15 - 41 U/L 26   ALT 0 - 44 U/L 33       Latest Ref Rng & Units 12/21/2022   11:06 AM  CBC  WBC 4.0 - 10.5 K/uL 2.8   Hemoglobin 12.0 - 15.0 g/dL 9.5   Hematocrit 36.0 - 46.0 % 29.1   Platelets 150 - 400 K/uL 82     No images are attached to the encounter.  No results found.  Assessment and plan- Patient is a 72 y.o. female   Neutropenia- D/t chemotherapy. Previously on udenyca- d/c d/t abdominal pain (?). Now receiving zarxio. She is s/p zarxio on 12/20, 12/21, 12/22, and 12/29. WBC has improved. ANC 1.5 today. Patient has not yet received home zarxio. Agrees to receive zarxio today which will be her last dose. Reviewed that with [place], neutropenia is less likely.  Thrombocytopenia- Plts improved from 39 to 82. Improved.  Monitor.  Anemia- hmg 9.5. Stable.  Triple Negative Breast Cancer- stage IIA. Currently s/p cycle 4 of adjuvant Adriamycin-Cytoxan-Keytruda on 12/07/22. Reviewed plan for continuation of keytruda every 3 weeks. She agrees. Discussed with Dr Rogue Bussing who will enter orders via IS.  DM- steroids reduced previously. Blood sugars persistently elevated. BG 178 today.  Weight loss/inappetence - reviewed role of shakes/nutritional supplements. Hopefully this will improve as she recovers from chemotherapy.   Disposition: Zarxio & deaccess port today Cancel tomorrow's appts Dr Rogue Bussing is entering other upcoming appts via IS- la   Visit Diagnosis 1. Chemotherapy induced neutropenia (H[facility])   2. Carcinoma of upper-outer quadrant of right breast in female, estrogen receptor negative ([place])   3. Chemotherapy-induced thrombocytopenia   4. Antineoplastic chemotherapy induced anemia   5. Hyperglycemia due to diabetes mellitus [facility])     Patient expressed understanding and was in agreement with this plan. She also understands that She can call clinic at any time with any questions, concerns, or complaints.   Thank you for allowing me to participate in the care of this pleasant patient.   Beckey Rutter, DNP, AGNP-C [place] [facility] [place]

## 2022-12-21 NOTE — Progress Notes (Signed)
OFF PATHWAY REGIMEN - Breast  No Change  Continue With Treatment as Ordered.  Original Decision Date/Time: 05/10/2022 21:26   [id number]:Carboplatin AUC=5 IV D1 + Paclitaxel 80 mg/m2 IV D1,8,15 + Pembrolizumab 200 mg IV D1 + G-CSF q21 Days x 12 Weeks Followed by [facility] + Pembrolizumab 200 mg IV D1 + G-CSF q21 Days x 12 Weeks:   Cycles 1 through 4: A cycle is every 21 days:     Pembrolizumab      Paclitaxel      Carboplatin      Filgrastim-xxxx    Cycles 5 through 8: A cycle is every 21 days:     Pembrolizumab      Doxorubicin      Cyclophosphamide      Pegfilgrastim-xxxx   **Always confirm dose/schedule in your pharmacy ordering system**  Patient Characteristics: Postoperative without Neoadjuvant Therapy (Pathologic Staging), Invasive Disease, Adjuvant Therapy, HER2 Negative/Unknown/Equivocal, ER Negative/Unknown, Node Positive Therapeutic Status: Postoperative without Neoadjuvant Therapy (Pathologic Staging) AJCC Grade: G3 AJCC N Category: pN1a AJCC M Category: cM0 ER Status: Negative (-) AJCC 8 Stage Grouping: IIA HER2 Status: Negative (-) Oncotype Dx Recurrence Score: Not Appropriate AJCC T Category: pT1c PR Status: Negative (-) Adjuvant Therapy Status: No Adjuvant Therapy Received Yet or Changing Initial Adjuvant Regimen due to Tolerance Intent of Therapy: Curative Intent, Discussed with Patient

## 2022-12-22 ENCOUNTER — Encounter: Payer: Self-pay | Admitting: Internal Medicine

## 2022-12-22 ENCOUNTER — Inpatient Hospital Stay: Payer: Medicare Other

## 2022-12-22 ENCOUNTER — Inpatient Hospital Stay: Payer: Medicare Other | Admitting: Medical Oncology

## 2022-12-22 ENCOUNTER — Telehealth: Payer: Self-pay | Admitting: *Deleted

## 2022-12-22 ENCOUNTER — Other Ambulatory Visit: Payer: Self-pay | Admitting: Internal Medicine

## 2022-12-22 DIAGNOSIS — Z171 Estrogen receptor negative status [ER-]: Secondary | ICD-10-CM

## 2022-12-22 NOTE — Telephone Encounter (Signed)
Patient evaluated in smc on 12/21/22. Pt agreeable to apts

## 2022-12-22 NOTE — Progress Notes (Signed)
Please schedule the patient on 1/16- MD; labs- cbc/cmp; TSH; Beryle Flock- Dr.B

## 2022-12-22 NOTE — Telephone Encounter (Signed)
Confirmed with Dr. B that she does not need to come for further Zarxio inj at this time.

## 2022-12-22 NOTE — Telephone Encounter (Signed)
Patient called reporting that she has not received the Zarxio prescription and is asking if she needs to come in to clinic to get injection. Please advise

## 2022-12-22 NOTE — Telephone Encounter (Signed)
Patient informed that she does not need Zarxio at this time

## 2022-12-23 ENCOUNTER — Inpatient Hospital Stay: Payer: Medicare Other

## 2022-12-23 LAB — URINE CULTURE: Culture: <10000 — AB

## 2022-12-23 NOTE — Progress Notes (Signed)
Nutrition Follow-up:   Patient with triple negative breast cancer.  S/p adriamycin and cytoxan.  Planning to start [place] next week.    Spoke with patient via phone.  Patient reports that her appetite is improving after finishing adriamycin and cytoxan and stopping growth factor.  She has been feeling hungry.  Eating steak biscuit just before call.  Has had [employer] chili recently, cube steak and mashed potatoes and lima beans. Also has had milkshakes and drinking boost shakes.  Planning on making shake with carnation instant breakfast packet, lacataid milk, ice cream and whey protein powder.  Says that she has not had to take any nausea medication this past week    Medications: reviewed, on ozempic  Labs: reviewed  Anthropometrics:   Weight 172 lb on 1/2  193 lb on 10/3 197 lb on 8/22   NUTRITION DIAGNOSIS: Inadequate oral intake improving   INTERVENTION:  Continue small frequent meals/snacks Continue adding protein at every meal/snack Encouraged milkshakes, smoothies, oral nutrition supplement for added calories and protein    MONITORING, EVALUATION, GOAL: weight trends, intake   NEXT VISIT: ~ 3 weeks  Cadie Sorci B. Zenia Resides, [place], [place] Registered Dietitian [phone]

## 2022-12-27 ENCOUNTER — Ambulatory Visit (INDEPENDENT_AMBULATORY_CARE_PROVIDER_SITE_OTHER): Payer: Medicare Other | Admitting: Family Medicine

## 2022-12-27 NOTE — Progress Notes (Unsigned)
Subjective:   Morgan Peterson is a 72 y.o. female who presents for Medicare Annual (Subsequent) preventive examination.  Review of Systems:  ***       Objective:     Vitals: There were no vitals taken for this visit.  There is no height or weight on file to calculate BMI.     12/21/2022   11:19 AM 12/17/2022    1:19 PM 11/30/2022    8:38 AM 11/22/2022    9:01 AM 11/01/2022    9:39 AM 10/22/2022    7:32 AM 10/11/2022    8:25 AM  Advanced Directives  Does Patient Have a Medical Advance Directive? No No No No No No No  Does patient want to make changes to medical advance directive? No - Patient declined  No - Patient declined  No - Patient declined No - Patient declined   Would patient like information on creating a medical advance directive? No - Patient declined   No - Patient declined No - Patient declined No - Patient declined     Tobacco Social History   Tobacco Use  Smoking Status Former   Packs/day: 0.25   Years: 10.00   Total pack years: 2.50   Types: Cigarettes   Start date: 25   Quit date: 1988   Years since quitting: 36.0  Smokeless Tobacco Never  Tobacco Comments   smoking cessation materials not required     Counseling given: Not Answered Tobacco comments: smoking cessation materials not required   Clinical Intake:                       Past Medical History:  Diagnosis Date   Appetite loss    Asthma    Cervical radiculopathy    Chemotherapy-induced nausea    Hiatal hernia    Hypercholesterolemia    Hyperlipidemia    Hypertension    IBS (irritable bowel syndrome)    Malignant neoplasm of upper-outer quadrant of female breast ([place]) 04/11/2009   Right breast, invasive ductal carcinoma, 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 Arimidex therapy completed September 2015   Mild mitral valve prolapse    Neuropathy    feet, from chemo   Personal history of malignant neoplasm of  breast 2010   Personal history of radiation therapy 2010   mammosite   T2DM (type 2 diabetes mellitus) ([place]) 2011   Past Surgical History:  Procedure Laterality Date   ABDOMINAL EXPLORATION SURGERY  2000   ovarian cyst   BREAST BIOPSY Right 2010   +    BREAST EXCISIONAL BIOPSY Right 2010   + mammo site inasive mammo ca   BREAST LUM[facility]OMY Right 2010   [facility]   COLONOSCOPY  07/19/2012   Normal exam, Dr. Bary Castilla   COLONOSCOPY WITH PROPOFOL N/A 04/21/2022   Procedure: COLONOSCOPY WITH PROPOFOL;  Surgeon: Robert Bellow, MD;  Location: [facility] END[facility];  Service: Endoscopy;  Laterality: N/A;   ESOPHAGOGASTRODUODENOSCOPY (EGD) WITH PROPOFOL N/A 10/22/2022   Procedure: ESOPHAGOGASTRODUODENOSCOPY (EGD) WITH PROPOFOL;  Surgeon: Lucilla Lame, MD;  Location: [place];  Service: Endoscopy;  Laterality: N/A;  Diabetic   MASTECTOMY Right 2010   partial/ lumpectomy   PORTACATH PLACEMENT Left 05/19/2022   Procedure: INSERTION PORT-A-CATH;  Surgeon: Robert Bellow, MD;  Location: [facility] ORS;  Service: General;  Laterality: Left;   SIMPLE MASTECTOMY WITH AXILLARY SENTINEL NODE BIOPSY Right 04/26/2022   Procedure: SIMPLE  MASTECTOMY WITH AXILLARY SENTINEL NODE BIOPSY;  Surgeon: Robert Bellow, MD;  Location: [facility] OR[facility];  Service: General;  Laterality: Right;  RNFA to assist   TUBAL LIGATION  20 years ago   Family History  Problem Relation Age of Onset   Osteoporosis Mother    Diabetes Father    Kidney disease Father    Diabetes Brother    Breast cancer Maternal Aunt    Bladder Cancer Maternal Aunt    Cervical cancer Maternal Aunt    Colon cancer Maternal Uncle    Lung cancer Maternal Uncle    Social History   Socioeconomic History   Marital status: Married    Spouse name: Antonio   Number of children: 2   Years of education: Not on file   Highest education level: Not on file  Occupational History   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  Tobacco Use   Smoking  status: Former    Packs/day: 0.25    Years: 10.00    Total pack years: 2.50    Types: Cigarettes    Start date: 1978    Quit date: 1988    Years since quitting: 36.0   Smokeless tobacco: Never   Tobacco comments:    smoking cessation materials not required  Vaping Use   Vaping Use: Never used  Substance and Sexual Activity   Alcohol use: Yes    Comment: wine occ   Drug use: No   Sexual activity: Not Currently    Comment: husband has ED  Other Topics Concern   Not on file  Social History Narrative   Oncology nurse on the floor retired.;  Husband retired from [employer].  No smoking.   Social Determinants of Health   Financial Resource Strain: Low Risk  (12/31/2021)   Overall Financial Resource Strain (CARDIA)    Difficulty of Paying Living Expenses: Not hard at all  Food Insecurity: No Food Insecurity (12/31/2021)   Hunger Vital Sign    Worried About Running Out of Food in the Last Year: Never true    Ran Out of Food in the Last Year: Never true  Transportation Needs: No Transportation Needs (12/31/2021)   PRAPARE - Hydrologist (Medical): No    Lack of Transportation (Non-Medical): No  Physical Activity: Inactive (12/31/2021)   Exercise Vital Sign    Days of Exercise per Week: 0 days    Minutes of Exercise per Session: 0 min  Stress: No Stress Concern Present (12/31/2021)   [place]    Feeling of Stress : Only a little  Social Connections: Moderately Integrated (12/31/2021)   Social Connection and Isolation Panel [NHANES]    Frequency of Communication with Friends and Family: More than three times a week    Frequency of Social Gatherings with Friends and Family: Three times a week    Attends Religious Services: More than 4 times per year    Active Member of Clubs or Organizations: No    Attends Archivist Meetings: Never    Marital Status: Married    Outpatient  Encounter Medications as of 12/27/2022  Medication Sig   aspirin EC 81 MG tablet Take 1 tablet (81 mg total) by mouth daily.   atenolol (TENORMIN) 25 MG tablet Take 1 tablet (25 mg total) by mouth daily.   azelastine (ASTELIN) 0.1 % nasal spray Place 2 sprays into both nostrils daily as needed for allergies  or rhinitis. Use in each nostril as directed (Patient not taking: Reported on 11/01/2022)   betamethasone, augmented, (DIPROLENE) 0.05 % lotion Apply 1 application topically 2 (two) times daily. (Patient not taking: Reported on 10/12/2022)   Blood Glucose Monitoring Suppl (ONETOUCH VERIO) w/Device KIT 1 Device by Does not apply route once.   cholecalciferol (VITAMIN D) 1000 UNITS tablet Take 1,000 Units by mouth daily.   ciprofloxacin (CIPRO) 500 MG tablet Take 1 tablet (500 mg total) by mouth 2 (two) times daily for 10 days.   Cyanocobalamin (VITAMIN B-12 PO) Take 1 tablet by mouth daily as needed (fatigue).   dronabinol (MARINOL) 2.5 MG capsule Take 1 capsule (2.5 mg total) by mouth daily before lunch. (Patient not taking: Reported on 11/01/2022)   EPINEPHrine (EPIPEN 2-PAK) 0.3 mg/0.3 mL IJ SOAJ injection Inject 0.3 mg into the muscle as needed for anaphylaxis. (Patient not taking: Reported on 11/01/2022)   famotidine (PEPCID) 20 MG tablet Take 20 mg by mouth 2 (two) times daily.   glucose blood (ONETOUCH VERIO) test strip Use as instructed   Lancets (ONETOUCH ULTRASOFT) lancets Use as instructed   levocetirizine (XYZAL) 5 MG tablet Take 1 tablet (5 mg total) by mouth every evening.   lidocaine-prilocaine (EMLA) cream Apply 1 application. topically as needed. Apply to port and cover with saran wrap 1-2 hours prior to port access   magnesium oxide (MAG-OX) 400 (240 Mg) MG tablet Take 1 tablet (400 mg total) by mouth daily. (Patient not taking: Reported on 11/01/2022)   montelukast (SINGULAIR) 10 MG tablet TAKE 1 TABLET BY MOUTH EVERYDAY AT BEDTIME   Multiple Vitamin (MULTIVITAMIN WITH MINERALS)  TABS tablet Take 1 tablet by mouth 2 (two) times a week.   ondansetron (ZOFRAN) 8 MG tablet Take 1 tablet (8 mg total) by mouth every 8 (eight) hours as needed for nausea or vomiting.   OZEMPIC, 0.25 OR 0.5 MG/DOSE, 2 MG/1.5ML SOPN Inject 0.5 mg into the skin every Sunday.   promethazine (PHENERGAN) 25 MG tablet Take 1 tablet (25 mg total) by mouth every 6 (six) hours as needed for nausea or vomiting. (Patient not taking: Reported on 12/21/2022)   rizatriptan (MAXALT-MLT) 10 MG disintegrating tablet Take 5-10 mg by mouth as needed for migraine. May repeat in 2 hours if needed (Patient not taking: Reported on 11/01/2022)   rosuvastatin (CRESTOR) 10 MG tablet Take 1 tablet (10 mg total) by mouth every morning.   spironolactone (ALDACTONE) 50 MG tablet TAKE 1 TABLET BY MOUTH EVERY DAY   Facility-Administered Encounter Medications as of 12/27/2022  Medication   heparin lock flush 100 unit/mL    Activities of Daily Living    10/22/2022    7:21 AM 08/27/2022    3:35 PM  In your present state of health, do you have any difficulty performing the following activities:  Hearing? 0 0  Vision? 0 0  Difficulty concentrating or making decisions? 0 0  Walking or climbing stairs? 0 0  Dressing or bathing? 0 0  Doing errands, shopping?  0    Patient Care Team: Steele Sizer, MD as PCP - General (Family Medicine) [name], Forest Gleason, MD (General Surgery) Carloyn Manner, MD as Referring Physician (Otolaryngology) Yolonda Kida, MD as Consulting Physician (Cardiology) Ree Edman, MD as Referring Physician (Dermatology) Lonia Farber, MD as Consulting Physician (Internal Medicine) Cammie Sickle, MD as Consulting Physician (Internal Medicine)    Assessment:   This is a routine wellness examination for Kieu.  Exercise Activities and Dietary recommendations  Goals Addressed   None     Fall Risk:    08/27/2022    3:35 PM 08/05/2022   11:36 AM 01/11/2022    3:05 PM  12/31/2021    9:16 AM 12/24/2021   11:45 AM  Fall Risk   Falls in the past year? 0 0 0 0 0  Number falls in past yr:  0 0 0 0  Injury with Fall?  0 0 0 0  Risk for fall due to : No Fall Risks No Fall Risks  No Fall Risks No Fall Risks  Follow up Falls prevention discussed;Education provided;Falls evaluation completed Falls prevention discussed Falls evaluation completed Falls prevention discussed Falls prevention discussed    FALL RISK PREVENTION PERTAINING TO THE HOME:  Any stairs in or around the home? {YES/NO:21197} If so, are there any without handrails? {YES/NO:21197}  Home free of loose throw rugs in walkways, pet beds, electrical cords, etc? {YES/NO:21197} Adequate lighting in your home to reduce risk of falls? {YES/NO:21197}  ASSISTIVE DEVICES UTILIZED TO PREVENT FALLS:  Life alert? {YES/NO:21197} Use of a cane, walker or w/c? {YES/NO:21197} Grab bars in the bathroom? {YES/NO:21197} Shower chair or bench in shower? {YES/NO:21197} Elevated toilet seat or a handicapped toilet? {YES/NO:21197}  DME ORDERS:  DME order needed?  {YES/NO:21197}  TIMED UP AND GO:  Was the test performed? {YES/NO:21197}.  Length of time to ambulate 10 feet: *** sec.   GAIT:   Unable to perform video visit  Appearance of gait: Gait steady and fast*** with/without the use of an assistive device. OR Gait slow and steady *** with/without the use of an assistive device.  Education: Fall risk prevention has been discussed.  Intervention(s) required? {YES/NO:21197}  DME/home health order needed?  {YES/NO:21197}   Depression Screen    08/27/2022    3:35 PM 08/05/2022   11:37 AM 01/11/2022    3:05 PM 12/31/2021    9:16 AM  PHQ 2/9 Scores  PHQ - 2 Score 0 0 0 0  PHQ- 9 Score 1 3  0     Cognitive Function        Immunization History  Administered Date(s) Administered   Fluad Quad(high Dose 65+) 10/09/2019, 10/13/2020   Influenza Split 11/13/2012   Influenza, High Dose Seasonal PF  08/18/2017, 10/13/2018, 10/13/2021   Influenza, Seasonal, Injecte, Preservative Fre 11/19/2010, 08/17/2011   Influenza,inj,Quad PF,6+ Mos 11/07/2013, 11/21/2014, 10/03/2015, 09/03/2016   Influenza-Unspecified 11/07/2013, 11/21/2014, 10/03/2015, 09/03/2016   PFIZER(Purple Top)SARS-COV-2 Vaccination 03/31/2020, 04/23/2020, 12/10/2020   PPD Test 07/22/2021   Pneumococcal Conjugate-13 05/14/2014, 11/06/2019   Pneumococcal Polysaccharide-23 11/19/2010, 11/26/2016   Tdap 11/19/2010, 01/20/2021   Zoster, Live 07/23/2013    Qualifies for Shingles Vaccine? Yes   Due for Shingrix. Education has been provided regarding the importance of this vaccine. Pt has been advised to call insurance company to determine out of pocket expense. Advised may also receive vaccine at local pharmacy or Health Dept. Verbalized acceptance and understanding.  Tdap: 01/20/21  Flu Vaccine: Due for Flu vaccine. Does the patient want to receive this vaccine today?  {YES/NO:21197}. Education has been provided regarding the importance of this vaccine but still declined. Advised may receive this vaccine at local pharmacy or Health Dept. Aware to provide a copy of the vaccination record if obtained from local pharmacy or Health Dept. Verbalized acceptance and understanding.  Pneumococcal Vaccine: Due for Pneumococcal vaccine 20. Has had 13-11/17/20,05/14/14 23-12/18/17,11/19/10  Covid-19 Vaccine: has had 3 doses per chart   Screening Tests Health Maintenance  Topic  Date Due   Zoster Vaccines- Shingrix (1 of 2) Never done   Diabetic kidney evaluation - Urine ACR  01/31/2020   OPHTHALMOLOGY EXAM  06/05/2020   FOOT EXAM  01/20/2022   HEMOGLOBIN A1C  08/03/2022   COVID-19 Vaccine (4 - 2023-24 season) 08/20/2022   Medicare Annual Wellness (AWV)  12/24/2022   INFLUENZA VACCINE  03/20/2023 (Originally 07/20/2022)   Diabetic kidney evaluation - eGFR measurement  12/22/2023   MAMMOGRAM  03/18/2024   DTaP/Tdap/Td (3 - Td or Tdap)  01/20/2031   COLONOSCOPY (Pts 45-81yr Insurance coverage will need to be confirmed)  04/21/2032   Pneumonia Vaccine 72 Years old  Completed   DEXA SCAN  Completed   Hepatitis C Screening  Completed   HPV VACCINES  Aged Out    Cancer Screenings:  Colorectal Screening: Completed 04/21/22. Repeat every 10 years; No longer required.   Mammogram: Completed 03/18/22. Repeat every 2 years  Bone Density: Completed 11/08/12. Results reflect  OSTEOPOROSIS.   Lung Cancer Screening: (Low Dose CT Chest recommended if Age 72-80years, 30 pack-year currently smoking OR have quit w/in 15years.) {DOES NOT does:27190::"does not"} qualify.   Lung Cancer Screening Referral: An Epic message has been sent to SBurgess Estelle RN (Oncology Nurse Navigator) regarding the possible need for this exam. SRaquel Sarnawill review the patient's chart to determine if the patient truly qualifies for the exam. If the patient qualifies, SRaquel Sarnawill order the Low Dose CT of the chest to facilitate the scheduling of this exam.  Additional Screening:  Hepatitis C Screening: Completed 11/22/12  Vision Screening: Recommended annual ophthalmology exams for early detection of glaucoma and other disorders of the eye. Is the patient up to date with their annual eye exam?  {YES/NO:21197} Who is the provider or what is the name of the office in which the pt attends annual eye exams? *** If pt is not established with a provider, would they like to be referred to a provider to establish care? {YES/NO:21197}. Ophthalmology referral has been placed. Pt aware the office will call re: appt.  Dental Screening: Recommended annual dental exams for proper oral hygiene  Community Resource Referral:  CRR required this visit?  {YES/NO:21197}      Plan:  I have personally reviewed and addressed the Medicare Annual Wellness questionnaire and have noted the following in the patient's chart:  A. Medical and social history B. Use of alcohol, tobacco or  illicit drugs  C. Current medications and supplements D. Functional ability and status E.  Nutritional status F.  Physical activity G. Advance directives H. List of other physicians I.  Hospitalizations, surgeries, and ER visits in previous 12 months J.  [place] as hearing and vision if needed, cognitive and depression L. Referrals and appointments   In addition, I have reviewed and discussed with patient certain preventive protocols, quality metrics, and best practice recommendations. A written personalized care plan for preventive services as well as general preventive health recommendations were provided to patient.  SMargaretha Glassing [place] 12/27/2022 Nurse Health Advisor   Nurse Notes:

## 2022-12-28 ENCOUNTER — Ambulatory Visit: Payer: Medicare Other

## 2022-12-30 ENCOUNTER — Encounter: Payer: Self-pay | Admitting: Internal Medicine

## 2023-01-04 ENCOUNTER — Inpatient Hospital Stay (HOSPITAL_BASED_OUTPATIENT_CLINIC_OR_DEPARTMENT_OTHER): Payer: Medicare Other | Admitting: Internal Medicine

## 2023-01-04 ENCOUNTER — Inpatient Hospital Stay: Payer: Medicare Other

## 2023-01-04 ENCOUNTER — Encounter: Payer: Self-pay | Admitting: Internal Medicine

## 2023-01-04 VITALS — BP 126/74 | HR 81 | Temp 97.6°F | Resp 20 | Wt 177.7 lb

## 2023-01-04 DIAGNOSIS — C50811 Malignant neoplasm of overlapping sites of right female breast: Secondary | ICD-10-CM

## 2023-01-04 DIAGNOSIS — Z923 Personal history of irradiation: Secondary | ICD-10-CM | POA: Diagnosis not present

## 2023-01-04 DIAGNOSIS — Z171 Estrogen receptor negative status [ER-]: Secondary | ICD-10-CM

## 2023-01-04 DIAGNOSIS — Z7982 Long term (current) use of aspirin: Secondary | ICD-10-CM | POA: Diagnosis not present

## 2023-01-04 DIAGNOSIS — Z79899 Other long term (current) drug therapy: Secondary | ICD-10-CM | POA: Diagnosis not present

## 2023-01-04 DIAGNOSIS — C50411 Malignant neoplasm of upper-outer quadrant of right female breast: Secondary | ICD-10-CM | POA: Diagnosis not present

## 2023-01-04 DIAGNOSIS — T451X5A Adverse effect of antineoplastic and immunosuppressive drugs, initial encounter: Secondary | ICD-10-CM | POA: Diagnosis not present

## 2023-01-04 DIAGNOSIS — D6959 Other secondary thrombocytopenia: Secondary | ICD-10-CM | POA: Diagnosis not present

## 2023-01-04 DIAGNOSIS — D6481 Anemia due to antineoplastic chemotherapy: Secondary | ICD-10-CM | POA: Diagnosis not present

## 2023-01-04 DIAGNOSIS — Z5112 Encounter for antineoplastic immunotherapy: Secondary | ICD-10-CM | POA: Diagnosis not present

## 2023-01-04 DIAGNOSIS — D701 Agranulocytosis secondary to cancer chemotherapy: Secondary | ICD-10-CM | POA: Diagnosis not present

## 2023-01-04 LAB — CBC WITH DIFFERENTIAL/PLATELET
Abs Immature Granulocytes: 0.03 10*3/uL (ref 0.00–0.07)
Basophils Absolute: 0 10*3/uL (ref 0.0–0.1)
Basophils Relative: 0 %
Eosinophils Absolute: 0 10*3/uL (ref 0.0–0.5)
Eosinophils Relative: 0 %
HCT: 28.2 % — ABNORMAL LOW (ref 36.0–46.0)
Hemoglobin: 8.6 g/dL — ABNORMAL LOW (ref 12.0–15.0)
Immature Granulocytes: 1 %
Lymphocytes Relative: 12 %
Lymphs Abs: 0.5 10*3/uL — ABNORMAL LOW (ref 0.7–4.0)
MCH: 28.7 pg (ref 26.0–34.0)
MCHC: 30.5 g/dL (ref 30.0–36.0)
MCV: 94 fL (ref 80.0–100.0)
Monocytes Absolute: 0.4 10*3/uL (ref 0.1–1.0)
Monocytes Relative: 10 %
Neutro Abs: 3.4 10*3/uL (ref 1.7–7.7)
Neutrophils Relative %: 77 %
Platelets: 223 10*3/uL (ref 150–400)
RBC: 3 MIL/uL — ABNORMAL LOW (ref 3.87–5.11)
RDW: 17 % — ABNORMAL HIGH (ref 11.5–15.5)
WBC: 4.5 10*3/uL (ref 4.0–10.5)
nRBC: 0 % (ref 0.0–0.2)

## 2023-01-04 LAB — COMPREHENSIVE METABOLIC PANEL
ALT: 15 U/L (ref 0–44)
AST: 21 U/L (ref 15–41)
Albumin: 3.3 g/dL — ABNORMAL LOW (ref 3.5–5.0)
Alkaline Phosphatase: 67 U/L (ref 38–126)
Anion gap: 8 (ref 5–15)
BUN: 15 mg/dL (ref 8–23)
CO2: 23 mmol/L (ref 22–32)
Calcium: 8.6 mg/dL — ABNORMAL LOW (ref 8.9–10.3)
Chloride: 108 mmol/L (ref 98–111)
Creatinine, Ser: 1.01 mg/dL — ABNORMAL HIGH (ref 0.44–1.00)
GFR, Estimated: 60 mL/min — ABNORMAL LOW (ref >60)
Glucose, Bld: 111 mg/dL — ABNORMAL HIGH (ref 70–99)
Potassium: 3.7 mmol/L (ref 3.5–5.1)
Sodium: 139 mmol/L (ref 135–145)
Total Bilirubin: 0.3 mg/dL (ref 0.3–1.2)
Total Protein: 6.8 g/dL (ref 6.5–8.1)

## 2023-01-04 LAB — TSH: TSH: 2.689 u[IU]/mL (ref 0.350–4.500)

## 2023-01-04 MED ORDER — SODIUM CHLORIDE 0.9 % IV SOLN
200.0000 mg | Freq: Once | INTRAVENOUS | Status: AC
  Administered 2023-01-04: 200 mg via INTRAVENOUS
  Filled 2023-01-04: qty 200

## 2023-01-04 MED ORDER — SODIUM CHLORIDE 0.9 % IV SOLN
Freq: Once | INTRAVENOUS | Status: AC
  Filled 2023-01-04: qty 250

## 2023-01-04 MED ORDER — SODIUM CHLORIDE 0.9% FLUSH
10.0000 mL | INTRAVENOUS | Status: DC | PRN
  Administered 2023-01-04: 10 mL
  Filled 2023-01-04: qty 10

## 2023-01-04 MED ORDER — HEPARIN SOD (PORK) LOCK FLUSH 100 UNIT/ML IV SOLN
500.0000 [IU] | Freq: Once | INTRAVENOUS | Status: AC | PRN
  Administered 2023-01-04: 500 [IU]
  Filled 2023-01-04: qty 5

## 2023-01-04 NOTE — Progress Notes (Signed)
Patient has no concerns today.  Patient wants to know when she can start back getting SNS nails.

## 2023-01-04 NOTE — Progress Notes (Signed)
[facility] CONSULT NOTE  Patient Care Team: Steele Sizer, MD as PCP - General (Family Medicine) Bary Castilla, Forest Gleason, MD (General Surgery) Carloyn Manner, MD as Referring Physician (Otolaryngology) Yolonda Kida, MD as Consulting Physician (Cardiology) Ree Edman, MD as Referring Physician (Dermatology) Lonia Farber, MD as Consulting Physician (Internal Medicine) Cammie Sickle, MD as Consulting Physician (Internal Medicine)  CHIEF COMPLAINTS/PURPOSE OF CONSULTATION: Breast cancer  #  Oncology History Overview Note  She has a remote history of right breast cancer, T1bN0, ER positive,s/p lumpectomy and MammoSite radiation and 5 years of Arimidex.    02/19/22 screening mammogram bilaterally showed  1. Suspicious right breast mass at the 10 o'clock position 8 cm from the nipple. Recommend ultrasound-guided biopsy. 2. Indeterminate right breast mass at the 6 o'clock position 4 cm from the nipple. Recommend ultrasound-guided biopsy. 3. No suspicious right axillary lymphadenopathy.   04/12/2022 Unilateral right breast diagnostic mammogram showed 1. Suspicious right breast mass at the 10 o'clock position 8 cm from the nipple. Recommend ultrasound-guided biopsy. 2. Indeterminate right breast mass at the 6 o'clock position 4 cm from the nipple. Recommend ultrasound-guided biopsy. 3. No suspicious right axillary lymphadenopathy.   04/13/2022 right breast mass 6 o'clock position 1 cm from the nipple showed invasive mammary carcinoma, no special type. Grade 3, DCIS present high grade, LVI not identified. ER-, PR 1-10%, HER 2 -   04/13/2022, right breast mass 10:00 7 cm from nipple biopsy showed invasive mammary carcinoma, grade 3, high-grade DCIS with focal comedonecrosis, lymphovascular invasion not identified.  ER -, PR 1 to 10%, HER2-    04/26/2022, right breast mastectomy with axillary dissection Invasive mammary carcinoma, no special type, multifocal, DCIS  high-grade, benign nipple/areola.  2 deposits of invasive mammary carcinoma, no definite residual lymph node identified.   Breast cancer ([place])  05/10/2022 Initial Diagnosis   Breast cancer ([place])   05/10/2022 Cancer Staging   Staging form: Breast, AJCC 8th Edition - Pathologic stage from 05/10/2022: Stage IIA (pT1c, pN1a, cM0, G3, ER-, PR+, HER2-) - Signed by Earlie Server, MD on 05/10/2022 Stage prefix: Initial diagnosis Histologic grading system: 3 grade system   05/24/2022 - 05/24/2022 Chemotherapy   Patient is on Treatment Plan : BREAST Pembrolizumab (200) D1 + Carboplatin (5) D1 + Paclitaxel (80) D1,8,15 q21d X 4 cycles / Pembrolizumab (200) D1 + AC D1 q21d x 4 cycles     05/27/2022 - 08/13/2022 Chemotherapy   Patient is on Treatment Plan : BREAST Pembrolizumab (200) D1 + Carboplatin (5) D1 + Paclitaxel (80) D1,8,15 q21d X 4 cycles / Pembrolizumab (200) D1 + AC D1 q21d x 4 cycles     05/27/2022 - 12/07/2022 Chemotherapy   Patient is on Treatment Plan : BREAST Pembrolizumab (200) D1 + Carboplatin (5) D1 + Paclitaxel (80) D1,8,15 q21d X 4 cycles / Pembrolizumab (200) D1 + AC D1 q21d x 4 cycles     01/04/2023 -  Chemotherapy   Patient is on Treatment Plan : BREAST Pembrolizumab (200) q21d x 27 weeks     Carcinoma of upper-outer quadrant of right breast in female, estrogen receptor negative ([place])  05/26/2022 Initial Diagnosis   Carcinoma of upper-outer quadrant of right breast in female, estrogen receptor negative ([place])   05/26/2022 Cancer Staging   Staging form: Breast, AJCC 8th Edition - Pathologic: Stage IIA (pT1c, pN1, cM0, G3, ER-, PR-, HER2-) - Signed by Cammie Sickle, MD on 05/26/2022 Multigene prognostic tests performed: None Histologic grading system: 3 grade system  05/27/2022 - 12/07/2022 Chemotherapy   Patient is on Treatment Plan : BREAST Pembrolizumab (200) D1 + Carboplatin (5) D1 + Paclitaxel (80) D1,8,15 q21d X 4 cycles / Pembrolizumab (200) D1 + AC D1 q21d x 4 cycles     01/04/2023  -  Chemotherapy   Patient is on Treatment Plan : BREAST Pembrolizumab (200) q21d x 27 weeks      HISTORY OF PRESENTING ILLNESS: Ambulating independently.  Alone.   Morgan Peterson 72 y.o.  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  Patient is currently status post cycle #4 of Adriamycin Cytoxan-Keytruda.  Patient has no concerns today.   Patient was neutropenic postchemotherapy as she declined growth factor.  She was on prophylactic Levaquin for about 1 week.  No fever no chills.  Appetite is improving with Marinol.    Review of Systems  Constitutional:  Positive for malaise/fatigue. Negative for chills, diaphoresis, fever and weight loss.  HENT:  Negative for nosebleeds and sore throat.   Eyes:  Negative for double vision.  Respiratory:  Negative for cough, hemoptysis, sputum production, shortness of breath and wheezing.   Cardiovascular:  Negative for chest pain, palpitations, orthopnea and leg swelling.  Gastrointestinal:  Negative for abdominal pain, blood in stool, constipation, diarrhea, heartburn, melena, nausea and vomiting.  Genitourinary:  Negative for dysuria, frequency and urgency.  Musculoskeletal:  Negative for back pain and joint pain.  Skin: Negative.  Negative for itching and rash.  Neurological:  Positive for tingling. Negative for dizziness, focal weakness, weakness and headaches.  Endo/Heme/Allergies:  Does not bruise/bleed easily.  Psychiatric/Behavioral:  Negative for depression. The patient is not nervous/anxious and does not have insomnia.      MEDICAL HISTORY:  Past Medical History:  Diagnosis Date   Appetite loss    Asthma    Cervical radiculopathy    Chemotherapy-induced nausea    Hiatal hernia    Hypercholesterolemia    Hyperlipidemia    Hypertension    IBS (irritable bowel syndrome)    Malignant neoplasm of  upper-outer quadrant of female breast ([place]) 04/11/2009   Right breast, invasive ductal carcinoma, 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 Arimidex therapy completed September 2015   Mild mitral valve prolapse    Neuropathy    feet, from chemo   Personal history of malignant neoplasm of breast 2010   Personal history of radiation therapy 2010   mammosite   T2DM (type 2 diabetes mellitus) ([place]) 2011    SURGICAL HISTORY: Past Surgical History:  Procedure Laterality Date   ABDOMINAL EXPLORATION SURGERY  2000   ovarian cyst   BREAST BIOPSY Right 2010   +    BREAST EXCISIONAL BIOPSY Right 2010   + mammo site inasive mammo ca   BREAST LUMPECTOMY Right 2010   [facility]   COLONOSCOPY  07/19/2012   Normal exam, Dr. Bary Castilla   COLONOSCOPY WITH PROPOFOL N/A 04/21/2022   Procedure: COLONOSCOPY WITH PROPOFOL;  Surgeon: Robert Bellow, MD;  Location: [facility];  Service: Endoscopy;  Laterality: N/A;   ESOPHAGOGASTRODUODENOSCOPY (EGD) WITH PROPOFOL N/A 10/22/2022   Procedure: ESOPHAGOGASTRODUODENOSCOPY (EGD) WITH PROPOFOL;  Surgeon: Lucilla Lame, MD;  Location: [facility];  Service: Endoscopy;  Laterality: N/A;  Diabetic   MASTECTOMY Right 2010   partial/ lumpectomy   PORTACATH PLACEMENT Left 05/19/2022   Procedure: INSERTION PORT-A-CATH;  Surgeon:  Robert Bellow, MD;  Location: [facility] ORS;  Service: General;  Laterality: Left;   SIMPLE MASTECTOMY WITH AXILLARY SENTINEL NODE BIOPSY Right 04/26/2022   Procedure: SIMPLE MASTECTOMY WITH AXILLARY SENTINEL NODE BIOPSY;  Surgeon: Robert Bellow, MD;  Location: [facility] OR[facility];  Service: General;  Laterality: Right;  RNFA to assist   TUBAL LIGATION  20 years ago    SOCIAL HISTORY: Social History   Socioeconomic History   Marital status: Married    Spouse name: Antonio   Number of children: 2   Years of education: Not on file   Highest education level: Not on file  Occupational History    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  Tobacco Use   Smoking status: Former    Packs/day: 0.25    Years: 10.00    Total pack years: 2.50    Types: Cigarettes    Start date: 1978    Quit date: 1988    Years since quitting: 36.0   Smokeless tobacco: Never   Tobacco comments:    smoking cessation materials not required  Vaping Use   Vaping Use: Never used  Substance and Sexual Activity   Alcohol use: Yes    Comment: wine occ   Drug use: No   Sexual activity: Not Currently    Comment: husband has ED  Other Topics Concern   Not on file  Social History Narrative   Oncology nurse on the floor retired.;  Husband retired from [employer].  No smoking.   Social Determinants of Health   Financial Resource Strain: Low Risk  (12/31/2021)   Overall Financial Resource Strain (CARDIA)    Difficulty of Paying Living Expenses: Not hard at all  Food Insecurity: No Food Insecurity (12/31/2021)   Hunger Vital Sign    Worried About Running Out of Food in the Last Year: Never true    Ran Out of Food in the Last Year: Never true  Transportation Needs: No Transportation Needs (12/31/2021)   PRAPARE - Hydrologist (Medical): No    Lack of Transportation (Non-Medical): No  Physical Activity: Inactive (12/31/2021)   Exercise Vital Sign    Days of Exercise per Week: 0 days    Minutes of Exercise per Session: 0 min  Stress: No Stress Concern Present (12/31/2021)   [place]    Feeling of Stress : Only a little  Social Connections: Moderately Integrated (12/31/2021)   Social Connection and Isolation Panel [NHANES]    Frequency of Communication with Friends and Family: More than three times a week    Frequency of Social Gatherings with Friends and Family: Three times a week    Attends Religious Services: More than 4 times per year    Active Member of Clubs or Organizations: No    Attends Theatre manager Meetings: Never    Marital Status: Married  Human resources officer Violence: Not At Risk (12/31/2021)   Humiliation, Afraid, Rape, and Kick questionnaire    Fear of Current or Ex-Partner: No    Emotionally Abused: No    Physically Abused: No    Sexually Abused: No    FAMILY HISTORY: Family History  Problem Relation Age of Onset   Osteoporosis Mother    Diabetes Father    Kidney disease Father    Diabetes Brother    Breast cancer Maternal Aunt    Bladder Cancer Maternal Aunt    Cervical cancer Maternal Aunt  Colon cancer Maternal Uncle    Lung cancer Maternal Uncle     ALLERGIES:  is allergic to levaquin [levofloxacin in d5w], losartan, metoprolol, saxagliptin, amlodipine-olmesartan, canagliflozin, egg white [egg white (egg protein)], gabapentin, glipizide, gluten meal, lisinopril, metformin and related, milk (cow), milk-related compounds, shrimp extract allergy skin test, tramadol, zyprexa [olanzapine], actos [pioglitazone], atorvastatin, carvedilol, and prednisone.  MEDICATIONS:  Current Outpatient Medications  Medication Sig Dispense Refill   aspirin EC 81 MG tablet Take 1 tablet (81 mg total) by mouth daily. 30 tablet 0   atenolol (TENORMIN) 25 MG tablet Take 1 tablet (25 mg total) by mouth daily. 90 tablet 0   Blood Glucose Monitoring Suppl (ONETOUCH VERIO) w/Device KIT 1 Device by Does not apply route once. 1 kit 0   cholecalciferol (VITAMIN D) 1000 UNITS tablet Take 1,000 Units by mouth daily.     Cyanocobalamin (VITAMIN B-12 PO) Take 1 tablet by mouth daily as needed (fatigue).     famotidine (PEPCID) 20 MG tablet Take 20 mg by mouth 2 (two) times daily.     glucose blood (ONETOUCH VERIO) test strip Use as instructed 100 each 12   Lancets (ONETOUCH ULTRASOFT) lancets Use as instructed 100 each 3   levocetirizine (XYZAL) 5 MG tablet Take 1 tablet (5 mg total) by mouth every evening. 90 tablet 1   lidocaine-prilocaine (EMLA) cream Apply 1 application. topically as  needed. Apply to port and cover with saran wrap 1-2 hours prior to port access 30 g 1   montelukast (SINGULAIR) 10 MG tablet TAKE 1 TABLET BY MOUTH EVERYDAY AT BEDTIME 90 tablet 1   Multiple Vitamin (MULTIVITAMIN WITH MINERALS) TABS tablet Take 1 tablet by mouth 2 (two) times a week.     ondansetron (ZOFRAN) 8 MG tablet Take 1 tablet (8 mg total) by mouth every 8 (eight) hours as needed for nausea or vomiting. 40 tablet 1   OZEMPIC, 0.25 OR 0.5 MG/DOSE, 2 MG/1.5ML SOPN Inject 0.5 mg into the skin every Sunday.     rosuvastatin (CRESTOR) 10 MG tablet Take 1 tablet (10 mg total) by mouth every morning. 90 tablet 1   spironolactone (ALDACTONE) 50 MG tablet TAKE 1 TABLET BY MOUTH EVERY DAY 90 tablet 0   azelastine (ASTELIN) 0.1 % nasal spray Place 2 sprays into both nostrils daily as needed for allergies or rhinitis. Use in each nostril as directed (Patient not taking: Reported on 11/01/2022)     betamethasone, augmented, (DIPROLENE) 0.05 % lotion Apply 1 application topically 2 (two) times daily. (Patient not taking: Reported on 10/12/2022)     dronabinol (MARINOL) 2.5 MG capsule Take 1 capsule (2.5 mg total) by mouth daily before lunch. (Patient not taking: Reported on 11/01/2022) 60 capsule 0   EPINEPHrine (EPIPEN 2-PAK) 0.3 mg/0.3 mL IJ SOAJ injection Inject 0.3 mg into the muscle as needed for anaphylaxis. (Patient not taking: Reported on 11/01/2022) 2 each 0   magnesium oxide (MAG-OX) 400 (240 Mg) MG tablet Take 1 tablet (400 mg total) by mouth daily. (Patient not taking: Reported on 11/01/2022) 15 tablet 0   promethazine (PHENERGAN) 25 MG tablet Take 1 tablet (25 mg total) by mouth every 6 (six) hours as needed for nausea or vomiting. (Patient not taking: Reported on 12/21/2022) 40 tablet 0   rizatriptan (MAXALT-MLT) 10 MG disintegrating tablet Take 5-10 mg by mouth as needed for migraine. May repeat in 2 hours if needed (Patient not taking: Reported on 11/01/2022)     No current  facility-administered medications for this  visit.   Facility-Administered Medications Ordered in Other Visits  Medication Dose Route Frequency Provider Last Rate Last Admin   heparin lock flush 100 unit/mL  500 Units Intravenous Once Cammie Sickle, MD       pembrolizumab [facility]) 200 mg in sodium chloride 0.9 % 50 mL chemo infusion  200 mg Intravenous Once Charlaine Dalton R, MD       sodium chloride flush (NS) 0.9 % injection 10 mL  10 mL Intracatheter PRN Cammie Sickle, MD   10 mL at 01/04/23 0932      .  PHYSICAL EXAMINATION: ECOG PERFORMANCE STATUS: 0 - Asymptomatic  Vitals:   01/04/23 0833  BP: 126/74  Pulse: 81  Resp: 20  Temp: 97.6 F (36.4 C)  SpO2: 100%    Filed Weights   01/04/23 0833  Weight: 177 lb 11.2 oz (80.6 kg)     Physical Exam Vitals and nursing note reviewed.  HENT:     Head: Normocephalic and atraumatic.     Mouth/Throat:     Pharynx: Oropharynx is clear.  Eyes:     Extraocular Movements: Extraocular movements intact.     Pupils: Pupils are equal, round, and reactive to light.  Cardiovascular:     Rate and Rhythm: Normal rate and regular rhythm.  Pulmonary:     Comments: Decreased breath sounds bilaterally.  Abdominal:     Palpations: Abdomen is soft.  Musculoskeletal:        General: Normal range of motion.     Cervical back: Normal range of motion.  Skin:    General: Skin is warm.  Neurological:     General: No focal deficit present.     Mental Status: She is alert and oriented to person, place, and time.  Psychiatric:        Behavior: Behavior normal.        Judgment: Judgment normal.      LABORATORY DATA:  I have reviewed the data as listed Lab Results  Component Value Date   WBC 4.5 01/04/2023   HGB 8.6 (L) 01/04/2023   HCT 28.2 (L) 01/04/2023   MCV 94.0 01/04/2023   PLT 223 01/04/2023   Recent Labs    12/17/22 1307 12/21/22 1106 01/04/23 0817  NA 136 137 139  K 4.3 4.0 3.7  CL 103 104 108   CO2 23 21* 23  GLUCOSE 161* 178* 111*  BUN 27* 11 15  CREATININE 0.98 0.94 1.01*  CALCIUM 9.1 8.8* 8.6*  GFRNONAA >60 >60 60*  PROT 8.0 7.1 6.8  ALBUMIN 3.4* 3.3* 3.3*  AST 58* 26 21  ALT 57* 33 15  ALKPHOS 64 65 67  BILITOT 0.4 0.5 0.3    RADIOGRAPHIC STUDIES: I have personally reviewed the radiological images as listed and agreed with the findings in the report. No results found.  ASSESSMENT & PLAN:   Carcinoma of upper-outer quadrant of right breast in female, estrogen receptor negative ([place]) # Stage II triple negative breast cancer [mT1cN1; Grade 3 ]- s/p mastectomy.  Patient currently on adjuvant chemoimmunotherapy [JMEQAST 419 study]; patient s/p carboplatin weekly Taxol.  Currently on Adriamycin-Cytoxan Keytruda-s/p 4 cycles [cycle #4 reduced to Adriamycin to 40 mg/m2.  ].   # proceed with adjuvant Keytruda #1 out of planned 9 cycles.  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   #  Hypomagnesemia magnesium NOV 2023- 1.7 continue mag oxide to twice a day.  Hypocalcemia- 8.4; vit D Sep 40. Continue ca+vit D.   # DM- Blood sugars today 111[Orange juice]- on ozempic.  Monitor blood sugars closely. STABLE.  # DISPOSITION: # keytruda today # follow up in 3 weeks- MD; port-labs- cbc/cmp; Keytruda- Dr.B      All questions were answered. The patient/family knows to call the clinic with any problems, questions or concerns.       Cammie Sickle, MD 01/04/2023 9:48 AM

## 2023-01-04 NOTE — Assessment & Plan Note (Addendum)
#  Stage II triple negative breast cancer [mT1cN1; Grade 3 ]- s/p mastectomy.  Patient currently on adjuvant chemoimmunotherapy [HRVACQP 848 study]; patient s/p carboplatin weekly Taxol.  Currently on Adriamycin-Cytoxan Keytruda-s/p 4 cycles [cycle #4 reduced to Adriamycin to 40 mg/m2.  ].   # proceed with adjuvant Keytruda #1 out of planned 9 cycles.  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   # Hypomagnesemia magnesium NOV 2023- 1.7 continue mag oxide to twice a day.  Hypocalcemia- 8.4; vit D Sep 40. Continue ca+vit D.   # DM- Blood sugars today 111[Orange juice]- on ozempic.  Monitor blood sugars closely. STABLE.  # DISPOSITION: # keytruda today # follow up in 3 weeks- MD; port-labs- cbc/cmp; Keytruda- Dr.B

## 2023-01-04 NOTE — Patient Instructions (Addendum)
[facility] [place]  Discharge Instructions: Thank you for choosing [facility] to provide your oncology and hematology care.  If you have a lab appointment with the Cancer Center, please go directly to the Cancer Center and check in at the registration area.  Wear comfortable clothing and clothing appropriate for easy access to any Portacath or PICC line.   We strive to give you quality time with your provider. You may need to reschedule your appointment if you arrive late (15 or more minutes).  Arriving late affects you and other patients whose appointments are after yours.  Also, if you miss three or more appointments without notifying the office, you may be dismissed from the clinic at the provider's discretion.      For prescription refill requests, have your pharmacy contact our office and allow 72 hours for refills to be completed.    Today you received the following chemotherapy and/or immunotherapy agents Keytruda       To help prevent nausea and vomiting after your treatment, we encourage you to take your nausea medication as directed.  BELOW ARE SYMPTOMS THAT SHOULD BE REPORTED IMMEDIATELY: *FEVER GREATER THAN 100.4 F (38 C) OR HIGHER *CHILLS OR SWEATING *NAUSEA AND VOMITING THAT IS NOT CONTROLLED WITH YOUR NAUSEA MEDICATION *UNUSUAL SHORTNESS OF BREATH *UNUSUAL BRUISING OR BLEEDING *URINARY PROBLEMS (pain or burning when urinating, or frequent urination) *BOWEL PROBLEMS (unusual diarrhea, constipation, pain near the anus) TENDERNESS IN MOUTH AND THROAT WITH OR WITHOUT PRESENCE OF ULCERS (sore throat, sores in mouth, or a toothache) UNUSUAL RASH, SWELLING OR PAIN  UNUSUAL VAGINAL DISCHARGE OR ITCHING   Items with * indicate a potential emergency and should be followed up as soon as possible or go to the Emergency Department if any problems should occur.  Please show the CHEMOTHERAPY ALERT CARD or IMMUNOTHERAPY ALERT CARD at check-in to  the Emergency Department and triage nurse.  Should you have questions after your visit or need to cancel or reschedule your appointment, please contact [facility]  [phone] and follow the prompts.  Office hours are 8:00 a.m. to 4:30 p.m. Monday - Friday. Please note that voicemails left after 4:00 p.m. may not be returned until the following business day.  We are closed weekends and major holidays. You have access to a nurse at all times for urgent questions. Please call the main number to the clinic [phone] and follow the prompts.  For any non-urgent questions, you may also contact your provider using MyChart. We now offer e-Visits for anyone 18 and older to request care online for non-urgent symptoms. For details visit [email or web address].   Also download the MyChart app! Go to the app store, search "MyChart", open the app, select [place], and log in with your MyChart username and password.    

## 2023-01-05 DIAGNOSIS — L668 Other cicatricial alopecia: Secondary | ICD-10-CM | POA: Diagnosis not present

## 2023-01-05 DIAGNOSIS — L648 Other androgenic alopecia: Secondary | ICD-10-CM | POA: Diagnosis not present

## 2023-01-05 DIAGNOSIS — R208 Other disturbances of skin sensation: Secondary | ICD-10-CM | POA: Diagnosis not present

## 2023-01-05 LAB — T4: T4, Total: 6 ug/dL (ref 4.5–12.0)

## 2023-01-09 ENCOUNTER — Other Ambulatory Visit: Payer: Self-pay | Admitting: Family Medicine

## 2023-01-09 DIAGNOSIS — E1169 Type 2 diabetes mellitus with other specified complication: Secondary | ICD-10-CM

## 2023-01-09 DIAGNOSIS — I10 Essential (primary) hypertension: Secondary | ICD-10-CM

## 2023-01-10 NOTE — Telephone Encounter (Signed)
Pt called asking to speak to Cassandra.  She said part of it was regarding this appt but she had something else to talk about also.  CB#  [phone]

## 2023-01-13 ENCOUNTER — Inpatient Hospital Stay: Payer: Medicare Other

## 2023-01-13 ENCOUNTER — Telehealth: Payer: Self-pay

## 2023-01-13 NOTE — Telephone Encounter (Signed)
Nutrition Follow-up:  Patient with triple negative breast cancer.  S/p adriamycin and cytoxan.  Patient receiving Beryle Flock currently.   Spoke with patient via phone.  Reports that appetite has improved and she is eating too much.  Feeling better, overall.  Planning to set up appointment with chiropractor for acupuncture for neuropathy.  Eating mostly vegetables but including meat for protein.     Medications: reviewed  Labs: reviewed  Anthropometrics:   Weight 177 lb 11.2 oz on 1/16 172 lb on 1/2 193 lb on 10/3 197 lb on 8/22   NUTRITION DIAGNOSIS: Inadequate oral intake improving    INTERVENTION:  Continue to encourage plant based diet including good sources of protein RD available if needed in the future    NEXT VISIT: no follow-up RD available as needed  Morgan Peterson B. Zenia Resides, [place], [place] Registered Dietitian [phone]

## 2023-01-13 NOTE — Progress Notes (Signed)
Nutrition  See phone note 

## 2023-01-24 ENCOUNTER — Encounter: Payer: Self-pay | Admitting: *Deleted

## 2023-01-24 NOTE — Progress Notes (Signed)
Acupuncture approval faxed to [facility] chiropract[facility]

## 2023-01-25 ENCOUNTER — Encounter: Payer: Self-pay | Admitting: Internal Medicine

## 2023-01-25 ENCOUNTER — Inpatient Hospital Stay: Payer: Medicare Other

## 2023-01-25 ENCOUNTER — Inpatient Hospital Stay: Payer: Medicare Other | Attending: Oncology | Admitting: Internal Medicine

## 2023-01-25 VITALS — BP 134/76 | HR 86 | Temp 99.0°F | Resp 18 | Wt 175.4 lb

## 2023-01-25 DIAGNOSIS — Z7982 Long term (current) use of aspirin: Secondary | ICD-10-CM | POA: Diagnosis not present

## 2023-01-25 DIAGNOSIS — G620 Drug-induced polyneuropathy: Secondary | ICD-10-CM | POA: Insufficient documentation

## 2023-01-25 DIAGNOSIS — E119 Type 2 diabetes mellitus without complications: Secondary | ICD-10-CM | POA: Diagnosis not present

## 2023-01-25 DIAGNOSIS — T451X5A Adverse effect of antineoplastic and immunosuppressive drugs, initial encounter: Secondary | ICD-10-CM | POA: Insufficient documentation

## 2023-01-25 DIAGNOSIS — C50811 Malignant neoplasm of overlapping sites of right female breast: Secondary | ICD-10-CM

## 2023-01-25 DIAGNOSIS — C50411 Malignant neoplasm of upper-outer quadrant of right female breast: Secondary | ICD-10-CM | POA: Insufficient documentation

## 2023-01-25 DIAGNOSIS — Z5112 Encounter for antineoplastic immunotherapy: Secondary | ICD-10-CM | POA: Diagnosis not present

## 2023-01-25 DIAGNOSIS — E876 Hypokalemia: Secondary | ICD-10-CM | POA: Diagnosis not present

## 2023-01-25 DIAGNOSIS — Z9011 Acquired absence of right breast and nipple: Secondary | ICD-10-CM | POA: Diagnosis not present

## 2023-01-25 DIAGNOSIS — Z923 Personal history of irradiation: Secondary | ICD-10-CM | POA: Insufficient documentation

## 2023-01-25 DIAGNOSIS — Z171 Estrogen receptor negative status [ER-]: Secondary | ICD-10-CM

## 2023-01-25 DIAGNOSIS — Z79899 Other long term (current) drug therapy: Secondary | ICD-10-CM | POA: Diagnosis not present

## 2023-01-25 LAB — COMPREHENSIVE METABOLIC PANEL
ALT: 15 U/L (ref 0–44)
AST: 21 U/L (ref 15–41)
Albumin: 3.6 g/dL (ref 3.5–5.0)
Alkaline Phosphatase: 82 U/L (ref 38–126)
Anion gap: 9 (ref 5–15)
BUN: 12 mg/dL (ref 8–23)
CO2: 23 mmol/L (ref 22–32)
Calcium: 9.3 mg/dL (ref 8.9–10.3)
Chloride: 106 mmol/L (ref 98–111)
Creatinine, Ser: 0.89 mg/dL (ref 0.44–1.00)
GFR, Estimated: >60 mL/min (ref >60)
Glucose, Bld: 113 mg/dL — ABNORMAL HIGH (ref 70–99)
Potassium: 3.8 mmol/L (ref 3.5–5.1)
Sodium: 138 mmol/L (ref 135–145)
Total Bilirubin: 0.5 mg/dL (ref 0.3–1.2)
Total Protein: 7.2 g/dL (ref 6.5–8.1)

## 2023-01-25 LAB — CBC WITH DIFFERENTIAL/PLATELET
Abs Immature Granulocytes: 0.04 10*3/uL (ref 0.00–0.07)
Basophils Absolute: 0 10*3/uL (ref 0.0–0.1)
Basophils Relative: 1 %
Eosinophils Absolute: 0.1 10*3/uL (ref 0.0–0.5)
Eosinophils Relative: 2 %
HCT: 31.3 % — ABNORMAL LOW (ref 36.0–46.0)
Hemoglobin: 9.7 g/dL — ABNORMAL LOW (ref 12.0–15.0)
Immature Granulocytes: 1 %
Lymphocytes Relative: 22 %
Lymphs Abs: 1 10*3/uL (ref 0.7–4.0)
MCH: 29.2 pg (ref 26.0–34.0)
MCHC: 31 g/dL (ref 30.0–36.0)
MCV: 94.3 fL (ref 80.0–100.0)
Monocytes Absolute: 0.5 10*3/uL (ref 0.1–1.0)
Monocytes Relative: 10 %
Neutro Abs: 3.1 10*3/uL (ref 1.7–7.7)
Neutrophils Relative %: 64 %
Platelets: 210 10*3/uL (ref 150–400)
RBC: 3.32 MIL/uL — ABNORMAL LOW (ref 3.87–5.11)
RDW: 17.2 % — ABNORMAL HIGH (ref 11.5–15.5)
WBC: 4.8 10*3/uL (ref 4.0–10.5)
nRBC: 0 % (ref 0.0–0.2)

## 2023-01-25 MED ORDER — INFLUENZA VAC A&B SA ADJ QUAD 0.5 ML IM PRSY
0.5000 mL | PREFILLED_SYRINGE | Freq: Once | INTRAMUSCULAR | Status: DC
  Filled 2023-01-25: qty 0.5

## 2023-01-25 MED ORDER — SODIUM CHLORIDE 0.9 % IV SOLN
Freq: Once | INTRAVENOUS | Status: AC
  Filled 2023-01-25: qty 250

## 2023-01-25 MED ORDER — SODIUM CHLORIDE 0.9 % IV SOLN
200.0000 mg | Freq: Once | INTRAVENOUS | Status: AC
  Administered 2023-01-25: 200 mg via INTRAVENOUS
  Filled 2023-01-25: qty 8

## 2023-01-25 MED ORDER — HEPARIN SOD (PORK) LOCK FLUSH 100 UNIT/ML IV SOLN
500.0000 [IU] | Freq: Once | INTRAVENOUS | Status: AC | PRN
  Administered 2023-01-25: 500 [IU]
  Filled 2023-01-25: qty 5

## 2023-01-25 NOTE — Assessment & Plan Note (Addendum)
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  Patient currently on adjuvant chemoimmunotherapy [TFTDDUK 025 study]; patient s/p carboplatin weekly Taxol.  Currently on Adriamycin-Cytoxan Keytruda-s/p 4 cycles [cycle #4 reduced to Adriamycin to 40 mg/m2].   # proceed with adjuvant Keytruda #2 out of planned 9 cycles. Labs today reviewed;  acceptable for treatment today. JAN 2024- TSH- WNL.   # Abdominal/epigastric pain status post EGD [NOV 2023- no ulcers; Dr.Wohl]- peptobismol prn- -Stable.   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   # Hypomagnesemia magnesium NOV 2023- 1.7 continue mag oxide to twice a day.  Hypocalcemia- 8.4; vit D Sep 40. Continue ca+vit D. Stable.   # DM- Blood sugars today 113 [Orange juice]- on ozempic.  Monitor blood sugars closely. Stable.   # vaccination: flu shot.   # DISPOSITION: # flu shot today # keytruda today # follow up in 3 weeks- MD; port-labs- cbc/cmp; Keytruda- Dr.B

## 2023-01-25 NOTE — Progress Notes (Signed)
Going for acupuncture for neuropathy. Energy is improved. Appetite is good. Denies pain. Doing better with abdomen discomfort. She gets a gnawing sensation and knows when to eat or take medication.

## 2023-01-25 NOTE — Patient Instructions (Signed)
[facility]  Discharge Instructions: Thank you for choosing [facility] to provide your oncology and hematology care.  If you have a lab appointment with the Cancer Center, please go directly to the Cancer Center and check in at the registration area.  Wear comfortable clothing and clothing appropriate for easy access to any Portacath or PICC line.   We strive to give you quality time with your provider. You may need to reschedule your appointment if you arrive late (15 or more minutes).  Arriving late affects you and other patients whose appointments are after yours.  Also, if you miss three or more appointments without notifying the office, you may be dismissed from the clinic at the provider's discretion.      For prescription refill requests, have your pharmacy contact our office and allow 72 hours for refills to be completed.    Today you received the following chemotherapy and/or immunotherapy agents- Keytruda      To help prevent nausea and vomiting after your treatment, we encourage you to take your nausea medication as directed.  BELOW ARE SYMPTOMS THAT SHOULD BE REPORTED IMMEDIATELY: *FEVER GREATER THAN 100.4 F (38 C) OR HIGHER *CHILLS OR SWEATING *NAUSEA AND VOMITING THAT IS NOT CONTROLLED WITH YOUR NAUSEA MEDICATION *UNUSUAL SHORTNESS OF BREATH *UNUSUAL BRUISING OR BLEEDING *URINARY PROBLEMS (pain or burning when urinating, or frequent urination) *BOWEL PROBLEMS (unusual diarrhea, constipation, pain near the anus) TENDERNESS IN MOUTH AND THROAT WITH OR WITHOUT PRESENCE OF ULCERS (sore throat, sores in mouth, or a toothache) UNUSUAL RASH, SWELLING OR PAIN  UNUSUAL VAGINAL DISCHARGE OR ITCHING   Items with * indicate a potential emergency and should be followed up as soon as possible or go to the Emergency Department if any problems should occur.  Please show the CHEMOTHERAPY ALERT CARD or IMMUNOTHERAPY ALERT CARD at check-in to  the Emergency Department and triage nurse.  Should you have questions after your visit or need to cancel or reschedule your appointment, please contact [facility]  [phone] and follow the prompts.  Office hours are 8:00 a.m. to 4:30 p.m. Monday - Friday. Please note that voicemails left after 4:00 p.m. may not be returned until the following business day.  We are closed weekends and major holidays. You have access to a nurse at all times for urgent questions. Please call the main number to the clinic [phone] and follow the prompts.  For any non-urgent questions, you may also contact your provider using MyChart. We now offer e-Visits for anyone 18 and older to request care online for non-urgent symptoms. For details visit [email or web address].   Also download the MyChart app! Go to the app store, search "MyChart", open the app, select [facility], and log in with your MyChart username and password.   

## 2023-01-25 NOTE — Progress Notes (Signed)
[facility] CONSULT NOTE  Patient Care Team: Morgan Sizer, MD as PCP - General (Family Medicine) Morgan Peterson, Forest Gleason, MD (General Surgery) Morgan Manner, MD as Referring Physician (Otolaryngology) Morgan Kida, MD as Consulting Physician (Cardiology) Morgan Edman, MD as Referring Physician (Dermatology) Morgan Farber, MD as Consulting Physician (Internal Medicine) Morgan Sickle, MD as Consulting Physician (Internal Medicine)  CHIEF COMPLAINTS/PURPOSE OF CONSULTATION: Breast cancer  #  Oncology History Overview Note  She has a remote history of right breast cancer, T1bN0, ER positive,s/p lumpectomy and MammoSite radiation and 5 years of Arimidex.    02/19/22 screening mammogram bilaterally showed  1. Suspicious right breast mass at the 10 o'clock position 8 cm from the nipple. Recommend ultrasound-guided biopsy. 2. Indeterminate right breast mass at the 6 o'clock position 4 cm from the nipple. Recommend ultrasound-guided biopsy. 3. No suspicious right axillary lymphadenopathy.   04/12/2022 Unilateral right breast diagnostic mammogram showed 1. Suspicious right breast mass at the 10 o'clock position 8 cm from the nipple. Recommend ultrasound-guided biopsy. 2. Indeterminate right breast mass at the 6 o'clock position 4 cm from the nipple. Recommend ultrasound-guided biopsy. 3. No suspicious right axillary lymphadenopathy.   04/13/2022 right breast mass 6 o'clock position 1 cm from the nipple showed invasive mammary carcinoma, no special type. Grade 3, DCIS present high grade, LVI not identified. ER-, PR 1-10%, HER 2 -   04/13/2022, right breast mass 10:00 7 cm from nipple biopsy showed invasive mammary carcinoma, grade 3, high-grade DCIS with focal comedonecrosis, lymphovascular invasion not identified.  ER -, PR 1 to 10%, HER2-    04/26/2022, right breast mastectomy with axillary dissection Invasive mammary carcinoma, no special type, multifocal, DCIS  high-grade, benign nipple/areola.  2 deposits of invasive mammary carcinoma, no definite residual lymph node identified.   Breast cancer ([place])  05/10/2022 Initial Diagnosis   Breast cancer ([place])   05/10/2022 Cancer Staging   Staging form: Breast, AJCC 8th Edition - Pathologic stage from 05/10/2022: Stage IIA (pT1c, pN1a, cM0, G3, ER-, PR+, HER2-) - Signed by Earlie Server, MD on 05/10/2022 Stage prefix: Initial diagnosis Histologic grading system: 3 grade system   05/24/2022 - 05/24/2022 Chemotherapy   Patient is on Treatment Plan : BREAST Pembrolizumab (200) D1 + Carboplatin (5) D1 + Paclitaxel (80) D1,8,15 q21d X 4 cycles / Pembrolizumab (200) D1 + AC D1 q21d x 4 cycles     05/27/2022 - 08/13/2022 Chemotherapy   Patient is on Treatment Plan : BREAST Pembrolizumab (200) D1 + Carboplatin (5) D1 + Paclitaxel (80) D1,8,15 q21d X 4 cycles / Pembrolizumab (200) D1 + AC D1 q21d x 4 cycles     05/27/2022 - 12/07/2022 Chemotherapy   Patient is on Treatment Plan : BREAST Pembrolizumab (200) D1 + Carboplatin (5) D1 + Paclitaxel (80) D1,8,15 q21d X 4 cycles / Pembrolizumab (200) D1 + AC D1 q21d x 4 cycles     01/04/2023 -  Chemotherapy   Patient is on Treatment Plan : BREAST Pembrolizumab (200) q21d x 27 weeks     Carcinoma of upper-outer quadrant of right breast in female, estrogen receptor negative ([place])  05/26/2022 Initial Diagnosis   Carcinoma of upper-outer quadrant of right breast in female, estrogen receptor negative ([place])   05/26/2022 Cancer Staging   Staging form: Breast, AJCC 8th Edition - Pathologic: Stage IIA (pT1c, pN1, cM0, G3, ER-, PR-, HER2-) - Signed by Morgan Sickle, MD on 05/26/2022 Multigene prognostic tests performed: None Histologic grading system: 3 grade system  05/27/2022 - 12/07/2022 Chemotherapy   Patient is on Treatment Plan : BREAST Pembrolizumab (200) D1 + Carboplatin (5) D1 + Paclitaxel (80) D1,8,15 q21d X 4 cycles / Pembrolizumab (200) D1 + AC D1 q21d x 4 cycles     01/04/2023  -  Chemotherapy   Patient is on Treatment Plan : BREAST Pembrolizumab (200) q21d x 27 weeks      HISTORY OF PRESENTING ILLNESS: Ambulating independently.  Alone.   Morgan Peterson 72 y.o.  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   Patient currently going for acupuncture for neuropathy. Energy is improved. Appetite is good. Denies pain.   Doing better with abdomen discomfort. She gets a gnawing sensation and knows when to eat or take medication. No fever no chills.   Review of Systems  Constitutional:  Positive for malaise/fatigue. Negative for chills, diaphoresis, fever and weight loss.  HENT:  Negative for nosebleeds and sore throat.   Eyes:  Negative for double vision.  Respiratory:  Negative for cough, hemoptysis, sputum production, shortness of breath and wheezing.   Cardiovascular:  Negative for chest pain, palpitations, orthopnea and leg swelling.  Gastrointestinal:  Negative for abdominal pain, blood in stool, constipation, diarrhea, heartburn, melena, nausea and vomiting.  Genitourinary:  Negative for dysuria, frequency and urgency.  Musculoskeletal:  Negative for back pain and joint pain.  Skin: Negative.  Negative for itching and rash.  Neurological:  Positive for tingling. Negative for dizziness, focal weakness, weakness and headaches.  Endo/Heme/Allergies:  Does not bruise/bleed easily.  Psychiatric/Behavioral:  Negative for depression. The patient is not nervous/anxious and does not have insomnia.      MEDICAL HISTORY:  Past Medical History:  Diagnosis Date   Appetite loss    Asthma    Cervical radiculopathy    Chemotherapy-induced nausea    Hiatal hernia    Hypercholesterolemia    Hyperlipidemia    Hypertension    IBS (irritable bowel syndrome)    Malignant neoplasm of upper-outer quadrant of female breast ([place]) 04/11/2009    Right breast, invasive ductal carcinoma, 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 Arimidex therapy completed September 2015   Mild mitral valve prolapse    Neuropathy    feet, from chemo   Personal history of malignant neoplasm of breast 2010   Personal history of radiation therapy 2010   mammosite   T2DM (type 2 diabetes mellitus) ([place]) 2011    SURGICAL HISTORY: Past Surgical History:  Procedure Laterality Date   ABDOMINAL EXPLORATION SURGERY  2000   ovarian cyst   BREAST BIOPSY Right 2010   +    BREAST EXCISIONAL BIOPSY Right 2010   + mammo site inasive mammo ca   BREAST LUMPECTOMY Right 2010   [facility]   COLONOSCOPY  07/19/2012   Normal exam, Dr. Bary Peterson   COLONOSCOPY WITH PROPOFOL N/A 04/21/2022   Procedure: COLONOSCOPY WITH PROPOFOL;  Surgeon: Morgan Bellow, MD;  Location: [facility];  Service: Endoscopy;  Laterality: N/A;   ESOPHAGOGASTRODUODENOSCOPY (EGD) WITH PROPOFOL N/A 10/22/2022   Procedure: ESOPHAGOGASTRODUODENOSCOPY (EGD) WITH PROPOFOL;  Surgeon: Morgan Lame, MD;  Location: [facility];  Service: Endoscopy;  Laterality: N/A;  Diabetic   MASTECTOMY Right 2010   partial/ lumpectomy   PORTACATH PLACEMENT Left 05/19/2022   Procedure: INSERTION PORT-A-CATH;  Surgeon: Morgan Bellow, MD;  Location: [facility];  Service: General;  Laterality: Left;   SIMPLE MASTECTOMY WITH AXILLARY SENTINEL NODE BIOPSY Right 04/26/2022   Procedure: SIMPLE MASTECTOMY WITH AXILLARY SENTINEL NODE BIOPSY;  Surgeon: Morgan Bellow, MD;  Location: [facility];  Service: General;  Laterality: Right;  RNFA to assist   TUBAL LIGATION  20 years ago    SOCIAL HISTORY: Social History   Socioeconomic History   Marital status: Married    Spouse name: Morgan Peterson   Number of children: 2   Years of education: Not on file   Highest education level: Not on file  Occupational History   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   Tobacco Use   Smoking status: Former    Packs/day: 0.25    Years: 10.00    Total pack years: 2.50    Types: Cigarettes    Start date: 1978    Quit date: 1988    Years since quitting: 36.1   Smokeless tobacco: Never   Tobacco comments:    smoking cessation materials not required  Vaping Use   Vaping Use: Never used  Substance and Sexual Activity   Alcohol use: Yes    Comment: wine occ   Drug use: No   Sexual activity: Not Currently    Comment: husband has ED  Other Topics Concern   Not on file  Social History Narrative   Oncology nurse on the floor retired.;  Husband retired from [employer].  No smoking.   Social Determinants of Health   Financial Resource Strain: Low Risk  (12/31/2021)   Overall Financial Resource Strain (CARDIA)    Difficulty of Paying Living Expenses: Not hard at all  Food Insecurity: No Food Insecurity (12/31/2021)   Hunger Vital Sign    Worried About Running Out of Food in the Last Year: Never true    Ran Out of Food in the Last Year: Never true  Transportation Needs: No Transportation Needs (12/31/2021)   PRAPARE - Hydrologist (Medical): No    Lack of Transportation (Non-Medical): No  Physical Activity: Inactive (12/31/2021)   Exercise Vital Sign    Days of Exercise per Week: 0 days    Minutes of Exercise per Session: 0 min  Stress: No Stress Concern Present (12/31/2021)   [place]    Feeling of Stress : Only a little  Social Connections: Moderately Integrated (12/31/2021)   Social Connection and Isolation Panel [NHANES]    Frequency of Communication with Friends and Family: More than three times a week    Frequency of Social Gatherings with Friends and Family: Three times a week    Attends Religious Services: More than 4 times per year    Active Member of Clubs or Organizations: No    Attends Archivist Meetings: Never    Marital Status:  Married  Human resources officer Violence: Not At Risk (12/31/2021)   Humiliation, Afraid, Rape, and Kick questionnaire    Fear of Current or Ex-Partner: No    Emotionally Abused: No    Physically Abused: No    Sexually Abused: No    FAMILY HISTORY: Family History  Problem Relation Age of Onset   Osteoporosis Mother    Diabetes Father    Kidney disease Father    Diabetes Brother    Breast cancer Maternal Aunt    Bladder Cancer Maternal Aunt    Cervical cancer Maternal Aunt    Colon cancer Maternal Uncle  Lung cancer Maternal Uncle     ALLERGIES:  is allergic to levaquin [levofloxacin in d5w], losartan, metoprolol, saxagliptin, amlodipine-olmesartan, canagliflozin, egg white [egg white (egg protein)], gabapentin, glipizide, gluten meal, lisinopril, metformin and related, milk (cow), milk-related compounds, shrimp extract allergy skin test, tramadol, zyprexa [olanzapine], actos [pioglitazone], atorvastatin, carvedilol, and prednisone.  MEDICATIONS:  Current Outpatient Medications  Medication Sig Dispense Refill   aspirin EC 81 MG tablet Take 1 tablet (81 mg total) by mouth daily. 30 tablet 0   atenolol (TENORMIN) 25 MG tablet Take 1 tablet (25 mg total) by mouth daily. 90 tablet 0   betamethasone, augmented, (DIPROLENE) 0.05 % lotion Apply 1 application  topically 2 (two) times daily.     Blood Glucose Monitoring Suppl (ONETOUCH VERIO) w/Device KIT 1 Device by Does not apply route once. 1 kit 0   cholecalciferol (VITAMIN D) 1000 UNITS tablet Take 1,000 Units by mouth daily.     Cyanocobalamin (VITAMIN B-12 PO) Take 1 tablet by mouth daily as needed (fatigue).     famotidine (PEPCID) 20 MG tablet Take 20 mg by mouth 2 (two) times daily.     glucose blood (ONETOUCH VERIO) test strip Use as instructed 100 each 12   Lancets (ONETOUCH ULTRASOFT) lancets Use as instructed 100 each 3   levocetirizine (XYZAL) 5 MG tablet Take 1 tablet (5 mg total) by mouth every evening. 90 tablet 1    lidocaine-prilocaine (EMLA) cream Apply 1 application. topically as needed. Apply to port and cover with saran wrap 1-2 hours prior to port access 30 g 1   montelukast (SINGULAIR) 10 MG tablet TAKE 1 TABLET BY MOUTH EVERYDAY AT BEDTIME 90 tablet 1   Multiple Vitamin (MULTIVITAMIN WITH MINERALS) TABS tablet Take 1 tablet by mouth 2 (two) times a week.     OZEMPIC, 0.25 OR 0.5 MG/DOSE, 2 MG/1.5ML SOPN Inject 0.5 mg into the skin every Sunday.     rosuvastatin (CRESTOR) 10 MG tablet TAKE 1 TABLET (10 MG TOTAL) BY MOUTH EVERY MORNING. 90 tablet 1   spironolactone (ALDACTONE) 50 MG tablet TAKE 1 TABLET BY MOUTH EVERY DAY 90 tablet 0   dronabinol (MARINOL) 2.5 MG capsule Take 1 capsule (2.5 mg total) by mouth daily before lunch. (Patient not taking: Reported on 11/01/2022) 60 capsule 0   EPINEPHrine (EPIPEN 2-PAK) 0.3 mg/0.3 mL IJ SOAJ injection Inject 0.3 mg into the muscle as needed for anaphylaxis. (Patient not taking: Reported on 11/01/2022) 2 each 0   ondansetron (ZOFRAN) 8 MG tablet Take 1 tablet (8 mg total) by mouth every 8 (eight) hours as needed for nausea or vomiting. (Patient not taking: Reported on 01/25/2023) 40 tablet 1   promethazine (PHENERGAN) 25 MG tablet Take 1 tablet (25 mg total) by mouth every 6 (six) hours as needed for nausea or vomiting. (Patient not taking: Reported on 12/21/2022) 40 tablet 0   rizatriptan (MAXALT-MLT) 10 MG disintegrating tablet Take 5-10 mg by mouth as needed for migraine. May repeat in 2 hours if needed (Patient not taking: Reported on 11/01/2022)     No current facility-administered medications for this visit.   Facility-Administered Medications Ordered in Other Visits  Medication Dose Route Frequency Provider Last Rate Last Admin   heparin lock flush 100 unit/mL  500 Units Intravenous Once Morgan Sickle, MD          .  PHYSICAL EXAMINATION: ECOG PERFORMANCE STATUS: 0 - Asymptomatic  Vitals:   01/25/23 1058  BP: 134/76  Pulse: 86  Resp: 18  Temp: 99 F (37.2 C)  SpO2: 100%    Filed Weights   01/25/23 1058  Weight: 175 lb 6.4 oz (79.6 kg)     Physical Exam Vitals and nursing note reviewed.  HENT:     Head: Normocephalic and atraumatic.     Mouth/Throat:     Pharynx: Oropharynx is clear.  Eyes:     Extraocular Movements: Extraocular movements intact.     Pupils: Pupils are equal, round, and reactive to light.  Cardiovascular:     Rate and Rhythm: Normal rate and regular rhythm.  Pulmonary:     Comments: Decreased breath sounds bilaterally.  Abdominal:     Palpations: Abdomen is soft.  Musculoskeletal:        General: Normal range of motion.     Cervical back: Normal range of motion.  Skin:    General: Skin is warm.  Neurological:     General: No focal deficit present.     Mental Status: She is alert and oriented to person, place, and time.  Psychiatric:        Behavior: Behavior normal.        Judgment: Judgment normal.      LABORATORY DATA:  I have reviewed the data as listed Lab Results  Component Value Date   WBC 4.8 01/25/2023   HGB 9.7 (L) 01/25/2023   HCT 31.3 (L) 01/25/2023   MCV 94.3 01/25/2023   PLT 210 01/25/2023   Recent Labs    12/21/22 1106 01/04/23 0817 01/25/23 1032  NA 137 139 138  K 4.0 3.7 3.8  CL 104 108 106  CO2 21* 23 23  GLUCOSE 178* 111* 113*  BUN '11 15 12  '$ CREATININE 0.94 1.01* 0.89  CALCIUM 8.8* 8.6* 9.3  GFRNONAA >60 60* >60  PROT 7.1 6.8 7.2  ALBUMIN 3.3* 3.3* 3.6  AST '26 21 21  '$ ALT 33 15 15  ALKPHOS 65 67 82  BILITOT 0.5 0.3 0.5    RADIOGRAPHIC STUDIES: I have personally reviewed the radiological images as listed and agreed with the findings in the report. No results found.  ASSESSMENT & PLAN:   Carcinoma of upper-outer quadrant of right breast in female, estrogen receptor negative ([place]) # Stage II triple negative breast cancer [mT1cN1; Grade 3 ]- s/p mastectomy.  Patient currently on adjuvant chemoimmunotherapy [CHENIDP 824 study]; patient s/p  carboplatin weekly Taxol.  Currently on Adriamycin-Cytoxan Keytruda-s/p 4 cycles [cycle #4 reduced to Adriamycin to 40 mg/m2.  ].   # proceed with adjuvant Keytruda #1 out of planned 9 cycles.  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   # Hypomagnesemia magnesium NOV 2023- 1.7 continue mag oxide to twice a day.  Hypocalcemia- 8.4; vit D Sep 40. Continue ca+vit D.   # DM- Blood sugars today 111[Orange juice]- on ozempic.  Monitor blood sugars closely. STABLE.  # DISPOSITION: # keytruda today # follow up in 3 weeks- MD; port-labs- cbc/cmp; Keytruda- Dr.B      All questions were answered. The patient/family knows to call the clinic with any problems, questions or concerns.       Morgan Sickle, MD 01/25/2023 11:22 AM

## 2023-01-26 ENCOUNTER — Other Ambulatory Visit: Payer: Self-pay | Admitting: Surgery

## 2023-01-26 ENCOUNTER — Other Ambulatory Visit: Payer: Self-pay

## 2023-01-26 DIAGNOSIS — Z1231 Encounter for screening mammogram for malignant neoplasm of breast: Secondary | ICD-10-CM

## 2023-01-27 ENCOUNTER — Other Ambulatory Visit: Payer: Self-pay

## 2023-01-27 ENCOUNTER — Telehealth: Payer: Self-pay | Admitting: Pharmacy Technician

## 2023-01-27 DIAGNOSIS — Z596 Low income: Secondary | ICD-10-CM

## 2023-01-27 NOTE — Progress Notes (Addendum)
[facility])                                            [place] Team   ADDENDUM 02/02/2023 Was informed by Pharmacy Student Levi Aland that application needs to be mailed to patient. Placed in mail today as outlined below. Jol[place] Mallozzi P. Sherra Kimmons, [place]  [phone]   01/27/2023  JOSEFA DEARY [birth year] [id number]                                      Medication Assistance Referral  Referral From: [place] (Pharmacy Student Estill Bamberg)  Medication/Company: Larna Daughters / Novo Nordisk Patient application portion:  Mailed via Engineer, manufacturing portion: Faxed  to Dr. Mee Hives Provider address/fax verified via: Office website  Jacqualin Shirkey P. Jezabel Lecker, [place]  [phone]

## 2023-02-04 ENCOUNTER — Ambulatory Visit: Payer: Medicare Other

## 2023-02-08 ENCOUNTER — Ambulatory Visit: Payer: Medicare Other | Admitting: Family Medicine

## 2023-02-15 ENCOUNTER — Inpatient Hospital Stay: Payer: Medicare Other

## 2023-02-15 ENCOUNTER — Inpatient Hospital Stay (HOSPITAL_BASED_OUTPATIENT_CLINIC_OR_DEPARTMENT_OTHER): Payer: Medicare Other | Admitting: Internal Medicine

## 2023-02-15 ENCOUNTER — Encounter: Payer: Self-pay | Admitting: Internal Medicine

## 2023-02-15 VITALS — BP 113/64 | HR 75 | Temp 98.1°F | Resp 16 | Wt 172.6 lb

## 2023-02-15 DIAGNOSIS — Z171 Estrogen receptor negative status [ER-]: Secondary | ICD-10-CM

## 2023-02-15 DIAGNOSIS — G620 Drug-induced polyneuropathy: Secondary | ICD-10-CM | POA: Diagnosis not present

## 2023-02-15 DIAGNOSIS — C50411 Malignant neoplasm of upper-outer quadrant of right female breast: Secondary | ICD-10-CM

## 2023-02-15 DIAGNOSIS — Z79899 Other long term (current) drug therapy: Secondary | ICD-10-CM | POA: Diagnosis not present

## 2023-02-15 DIAGNOSIS — C50811 Malignant neoplasm of overlapping sites of right female breast: Secondary | ICD-10-CM | POA: Diagnosis not present

## 2023-02-15 DIAGNOSIS — Z5112 Encounter for antineoplastic immunotherapy: Secondary | ICD-10-CM | POA: Diagnosis not present

## 2023-02-15 DIAGNOSIS — Z7982 Long term (current) use of aspirin: Secondary | ICD-10-CM | POA: Diagnosis not present

## 2023-02-15 DIAGNOSIS — T451X5A Adverse effect of antineoplastic and immunosuppressive drugs, initial encounter: Secondary | ICD-10-CM | POA: Diagnosis not present

## 2023-02-15 DIAGNOSIS — Z923 Personal history of irradiation: Secondary | ICD-10-CM | POA: Diagnosis not present

## 2023-02-15 DIAGNOSIS — Z9011 Acquired absence of right breast and nipple: Secondary | ICD-10-CM | POA: Diagnosis not present

## 2023-02-15 DIAGNOSIS — E119 Type 2 diabetes mellitus without complications: Secondary | ICD-10-CM | POA: Diagnosis not present

## 2023-02-15 DIAGNOSIS — E876 Hypokalemia: Secondary | ICD-10-CM | POA: Diagnosis not present

## 2023-02-15 LAB — COMPREHENSIVE METABOLIC PANEL
ALT: 14 U/L (ref 0–44)
AST: 22 U/L (ref 15–41)
Albumin: 3.6 g/dL (ref 3.5–5.0)
Alkaline Phosphatase: 75 U/L (ref 38–126)
Anion gap: 9 (ref 5–15)
BUN: 14 mg/dL (ref 8–23)
CO2: 24 mmol/L (ref 22–32)
Calcium: 9.1 mg/dL (ref 8.9–10.3)
Chloride: 104 mmol/L (ref 98–111)
Creatinine, Ser: 0.8 mg/dL (ref 0.44–1.00)
GFR, Estimated: >60 mL/min (ref >60)
Glucose, Bld: 95 mg/dL (ref 70–99)
Potassium: 3.9 mmol/L (ref 3.5–5.1)
Sodium: 137 mmol/L (ref 135–145)
Total Bilirubin: 0.5 mg/dL (ref 0.3–1.2)
Total Protein: 7.3 g/dL (ref 6.5–8.1)

## 2023-02-15 LAB — CBC WITH DIFFERENTIAL/PLATELET
Abs Immature Granulocytes: 0.01 10*3/uL (ref 0.00–0.07)
Basophils Absolute: 0 10*3/uL (ref 0.0–0.1)
Basophils Relative: 0 %
Eosinophils Absolute: 0.1 10*3/uL (ref 0.0–0.5)
Eosinophils Relative: 3 %
HCT: 32.1 % — ABNORMAL LOW (ref 36.0–46.0)
Hemoglobin: 10 g/dL — ABNORMAL LOW (ref 12.0–15.0)
Immature Granulocytes: 0 %
Lymphocytes Relative: 29 %
Lymphs Abs: 1.4 10*3/uL (ref 0.7–4.0)
MCH: 29.1 pg (ref 26.0–34.0)
MCHC: 31.2 g/dL (ref 30.0–36.0)
MCV: 93.3 fL (ref 80.0–100.0)
Monocytes Absolute: 0.3 10*3/uL (ref 0.1–1.0)
Monocytes Relative: 7 %
Neutro Abs: 2.9 10*3/uL (ref 1.7–7.7)
Neutrophils Relative %: 61 %
Platelets: 197 10*3/uL (ref 150–400)
RBC: 3.44 MIL/uL — ABNORMAL LOW (ref 3.87–5.11)
RDW: 15.9 % — ABNORMAL HIGH (ref 11.5–15.5)
WBC: 4.7 10*3/uL (ref 4.0–10.5)
nRBC: 0 % (ref 0.0–0.2)

## 2023-02-15 LAB — TSH: TSH: 2.044 u[IU]/mL (ref 0.350–4.500)

## 2023-02-15 MED ORDER — SODIUM CHLORIDE 0.9 % IV SOLN
Freq: Once | INTRAVENOUS | Status: AC
  Filled 2023-02-15: qty 250

## 2023-02-15 MED ORDER — HEPARIN SOD (PORK) LOCK FLUSH 100 UNIT/ML IV SOLN
500.0000 [IU] | Freq: Once | INTRAVENOUS | Status: AC | PRN
  Administered 2023-02-15: 500 [IU]
  Filled 2023-02-15: qty 5

## 2023-02-15 MED ORDER — SODIUM CHLORIDE 0.9 % IV SOLN
200.0000 mg | Freq: Once | INTRAVENOUS | Status: AC
  Administered 2023-02-15: 200 mg via INTRAVENOUS
  Filled 2023-02-15: qty 8

## 2023-02-15 NOTE — Progress Notes (Signed)
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 Taking accupuncture without much improvement. Does report fatigue. Appetite is up and down. Denies any nausea.

## 2023-02-15 NOTE — Assessment & Plan Note (Addendum)
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  Patient currently on adjuvant chemoimmunotherapy [id number] study]; patient s/p carboplatin weekly Taxol.  Currently on Adriamycin-Cytoxan Keytruda-s/p 4 cycles [cycle #4 reduced to Adriamycin to 40 mg/m2].   # proceed with adjuvant Keytruda #3  out of planned 9 cycles. Labs today reviewed;  acceptable for treatment today. FEB 2024- TSH- WNL.   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   # Hypomagnesemia magnesium NOV 2023- 1.7 continue mag oxide to twice a day.  vit D Sep 40. Continue ca+vit D. Stable.   # DM- Blood sugars today 95  on ozempic.  Monitor blood sugars closely. Stable.   # Hypokalemia- improved; ok to stop Kdur.   # vaccination: s/p flu shot.   # DISPOSITION: # keytruda today # follow up in 3 weeks- MD; port-labs- cbc/cmp; Keytruda- Dr.B

## 2023-02-15 NOTE — Progress Notes (Signed)
[facility] CONSULT NOTE  Patient Care Team: Steele Sizer, MD as PCP - General (Family Medicine) Bary Castilla, Forest Gleason, MD (General Surgery) Carloyn Manner, MD as Referring Physician (Otolaryngology) Yolonda Kida, MD as Consulting Physician (Cardiology) Ree Edman, MD as Referring Physician (Dermatology) Lonia Farber, MD as Consulting Physician (Internal Medicine) Cammie Sickle, MD as Consulting Physician (Internal Medicine)  CHIEF COMPLAINTS/PURPOSE OF CONSULTATION: Breast cancer  #  Oncology History Overview Note  She has a remote history of right breast cancer, T1bN0, ER positive,s/p lumpectomy and MammoSite radiation and 5 years of Arimidex.    02/19/22 screening mammogram bilaterally showed  1. Suspicious right breast mass at the 10 o'clock position 8 cm from the nipple. Recommend ultrasound-guided biopsy. 2. Indeterminate right breast mass at the 6 o'clock position 4 cm from the nipple. Recommend ultrasound-guided biopsy. 3. No suspicious right axillary lymphadenopathy.   04/12/2022 Unilateral right breast diagnostic mammogram showed 1. Suspicious right breast mass at the 10 o'clock position 8 cm from the nipple. Recommend ultrasound-guided biopsy. 2. Indeterminate right breast mass at the 6 o'clock position 4 cm from the nipple. Recommend ultrasound-guided biopsy. 3. No suspicious right axillary lymphadenopathy.   04/13/2022 right breast mass 6 o'clock position 1 cm from the nipple showed invasive mammary carcinoma, no special type. Grade 3, DCIS present high grade, LVI not identified. ER-, PR 1-10%, HER 2 -   04/13/2022, right breast mass 10:00 7 cm from nipple biopsy showed invasive mammary carcinoma, grade 3, high-grade DCIS with focal comedonecrosis, lymphovascular invasion not identified.  ER -, PR 1 to 10%, HER2-    04/26/2022, right breast mastectomy with axillary dissection Invasive mammary carcinoma, no special type, multifocal, DCIS  high-grade, benign nipple/areola.  2 deposits of invasive mammary carcinoma, no definite residual lymph node identified.   Breast cancer ([place])  05/10/2022 Initial Diagnosis   Breast cancer ([place])   05/10/2022 Cancer Staging   Staging form: Breast, AJCC 8th Edition - Pathologic stage from 05/10/2022: Stage IIA (pT1c, pN1a, cM0, G3, ER-, PR+, HER2-) - Signed by Earlie Server, MD on 05/10/2022 Stage prefix: Initial diagnosis Histologic grading system: 3 grade system   05/24/2022 - 05/24/2022 Chemotherapy   Patient is on Treatment Plan : BREAST Pembrolizumab (200) D1 + Carboplatin (5) D1 + Paclitaxel (80) D1,8,15 q21d X 4 cycles / Pembrolizumab (200) D1 + AC D1 q21d x 4 cycles     05/27/2022 - 08/13/2022 Chemotherapy   Patient is on Treatment Plan : BREAST Pembrolizumab (200) D1 + Carboplatin (5) D1 + Paclitaxel (80) D1,8,15 q21d X 4 cycles / Pembrolizumab (200) D1 + AC D1 q21d x 4 cycles     05/27/2022 - 12/07/2022 Chemotherapy   Patient is on Treatment Plan : BREAST Pembrolizumab (200) D1 + Carboplatin (5) D1 + Paclitaxel (80) D1,8,15 q21d X 4 cycles / Pembrolizumab (200) D1 + AC D1 q21d x 4 cycles     01/04/2023 -  Chemotherapy   Patient is on Treatment Plan : BREAST Pembrolizumab (200) q21d x 27 weeks     Carcinoma of upper-outer quadrant of right breast in female, estrogen receptor negative ([place])  05/26/2022 Initial Diagnosis   Carcinoma of upper-outer quadrant of right breast in female, estrogen receptor negative ([place])   05/26/2022 Cancer Staging   Staging form: Breast, AJCC 8th Edition - Pathologic: Stage IIA (pT1c, pN1, cM0, G3, ER-, PR-, HER2-) - Signed by Cammie Sickle, MD on 05/26/2022 Multigene prognostic tests performed: None Histologic grading system: 3 grade system  05/27/2022 - 12/07/2022 Chemotherapy   Patient is on Treatment Plan : BREAST Pembrolizumab (200) D1 + Carboplatin (5) D1 + Paclitaxel (80) D1,8,15 q21d X 4 cycles / Pembrolizumab (200) D1 + AC D1 q21d x 4 cycles     01/04/2023  -  Chemotherapy   Patient is on Treatment Plan : BREAST Pembrolizumab (200) q21d x 27 weeks      HISTORY OF PRESENTING ILLNESS: Ambulating independently.  Alone.   Morgan Peterson 72 y.o.  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 Taking accupuncture without much improvement.   Does report fatigue. Appetite is up and down. Denies any nausea . No fever no chills.   Review of Systems  Constitutional:  Positive for malaise/fatigue. Negative for chills, diaphoresis, fever and weight loss.  HENT:  Negative for nosebleeds and sore throat.   Eyes:  Negative for double vision.  Respiratory:  Negative for cough, hemoptysis, sputum production, shortness of breath and wheezing.   Cardiovascular:  Negative for chest pain, palpitations, orthopnea and leg swelling.  Gastrointestinal:  Negative for abdominal pain, blood in stool, constipation, diarrhea, heartburn, melena, nausea and vomiting.  Genitourinary:  Negative for dysuria, frequency and urgency.  Musculoskeletal:  Negative for back pain and joint pain.  Skin: Negative.  Negative for itching and rash.  Neurological:  Positive for tingling. Negative for dizziness, focal weakness, weakness and headaches.  Endo/Heme/Allergies:  Does not bruise/bleed easily.  Psychiatric/Behavioral:  Negative for depression. The patient is not nervous/anxious and does not have insomnia.      MEDICAL HISTORY:  Past Medical History:  Diagnosis Date   Appetite loss    Asthma    Cervical radiculopathy    Chemotherapy-induced nausea    Hiatal hernia    Hypercholesterolemia    Hyperlipidemia    Hypertension    IBS (irritable bowel syndrome)    Malignant neoplasm of upper-outer quadrant of female breast ([place]) 04/11/2009   Right breast, invasive ductal carcinoma, 0.7 cm, low grade,  T1b, N0, M0 ER 90%, PR 15%, HER-2/neu 1+ low Oncotype and recurrent score. Arimidex therapy completed September 2015   Mild mitral valve prolapse    Neuropathy    feet, from chemo   Personal history of malignant neoplasm of breast 2010   Personal history of radiation therapy 2010   mammosite   T2DM (type 2 diabetes mellitus) ([place]) 2011    SURGICAL HISTORY: Past Surgical History:  Procedure Laterality Date   ABDOMINAL EXPLORATION SURGERY  2000   ovarian cyst   BREAST BIOPSY Right 2010   +    BREAST EXCISIONAL BIOPSY Right 2010   + mammo site inasive mammo ca   BREAST L[facility]OMY Right [facility]   COLONOSCOPY  07/19/2012   Normal exam, Dr. Bary Castilla   COLONOSCOPY WITH PROPOFOL N/A 04/21/2022   Procedure: COLONOSCOPY WITH PROPOFOL;  Surgeon: Robert Bellow, MD;  Location: [facility] END[facility];  Service: Endoscopy;  Laterality: N/A;   ESOPHAGOGASTRODUODENOSCOPY (EGD) WITH PROPOFOL N/A 10/22/2022   Procedure: ESOPHAGOGASTRODUODENOSCOPY (EGD) WITH PROPOFOL;  Surgeon: Lucilla Lame, MD;  Location: [facility];  Service: Endoscopy;  Laterality: N/A;  Diabetic   MASTECTOMY Right 2010   partial/ lumpectomy   PORTACATH PLACEMENT Left 05/19/2022   Procedure: INSERTION PORT-A-CATH;  Surgeon: Robert Bellow, MD;  Location: [facility] ORS;  Service: General;  Laterality: Left;   SIMPLE MASTECTOMY WITH  AXILLARY SENTINEL NODE BIOPSY Right 04/26/2022   Procedure: SIMPLE MASTECTOMY WITH AXILLARY SENTINEL NODE BIOPSY;  Surgeon: Robert Bellow, MD;  Location: [facility] ORS;  Service: General;  Laterality: Right;  RNFA to assist   TUBAL LIGATION  20 years ago    SOCIAL HISTORY: Social History   Socioeconomic History   Marital status: Married    Spouse name: Antonio   Number of children: 2   Years of education: Not on file   Highest education level: Not on file  Occupational History   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  Tobacco Use   Smoking status: Former    Packs/day: 0.25     Years: 10.00    Total pack years: 2.50    Types: Cigarettes    Start date: 1978    Quit date: 1988    Years since quitting: 36.1   Smokeless tobacco: Never   Tobacco comments:    smoking cessation materials not required  Vaping Use   Vaping Use: Never used  Substance and Sexual Activity   Alcohol use: Yes    Comment: wine occ   Drug use: No   Sexual activity: Not Currently    Comment: husband has ED  Other Topics Concern   Not on file  Social History Narrative   Oncology nurse on the floor retired.;  Husband retired from [employer].  No smoking.   Social Determinants of Health   Financial Resource Strain: Low Risk  (12/31/2021)   Overall Financial Resource Strain (CARDIA)    Difficulty of Paying Living Expenses: Not hard at all  Food Insecurity: No Food Insecurity (12/31/2021)   Hunger Vital Sign    Worried About Running Out of Food in the Last Year: Never true    Ran Out of Food in the Last Year: Never true  Transportation Needs: No Transportation Needs (12/31/2021)   PRAPARE - Hydrologist (Medical): No    Lack of Transportation (Non-Medical): No  Physical Activity: Inactive (12/31/2021)   Exercise Vital Sign    Days of Exercise per Week: 0 days    Minutes of Exercise per Session: 0 min  Stress: No Stress Concern Present (12/31/2021)   [place]    Feeling of Stress : Only a little  Social Connections: Moderately Integrated (12/31/2021)   Social Connection and Isolation Panel [NHANES]    Frequency of Communication with Friends and Family: More than three times a week    Frequency of Social Gatherings with Friends and Family: Three times a week    Attends Religious Services: More than 4 times per year    Active Member of Clubs or Organizations: No    Attends Archivist Meetings: Never    Marital Status: Married  Human resources officer Violence: Not At Risk (12/31/2021)    Humiliation, Afraid, Rape, and Kick questionnaire    Fear of Current or Ex-Partner: No    Emotionally Abused: No    Physically Abused: No    Sexually Abused: No    FAMILY HISTORY: Family History  Problem Relation Age of Onset   Osteoporosis Mother    Diabetes Father    Kidney disease Father    Diabetes Brother    Breast cancer Maternal Aunt    Bladder Cancer Maternal Aunt    Cervical cancer Maternal Aunt    Colon cancer Maternal Uncle    Lung cancer Maternal Uncle     ALLERGIES:  is allergic to levaquin [levofloxacin in d5w], losartan, metoprolol, saxagliptin, amlodipine-olmesartan, canagliflozin, egg white [egg white (egg protein)], gabapentin, glipizide, gluten meal, lisinopril, metformin and related, milk (cow), milk-related compounds, shrimp extract allergy skin test, tramadol, zyprexa [olanzapine], actos [pioglitazone], atorvastatin, carvedilol, and prednisone.  MEDICATIONS:  Current Outpatient Medications  Medication Sig Dispense Refill   aspirin EC 81 MG tablet Take 1 tablet (81 mg total) by mouth daily. 30 tablet 0   atenolol (TENORMIN) 25 MG tablet Take 1 tablet (25 mg total) by mouth daily. 90 tablet 0   Blood Glucose Monitoring Suppl (ONETOUCH VERIO) w/Device KIT 1 Device by Does not apply route once. 1 kit 0   cholecalciferol (VITAMIN D) 1000 UNITS tablet Take 1,000 Units by mouth daily.     Cyanocobalamin (VITAMIN B-12 PO) Take 1 tablet by mouth daily as needed (fatigue).     famotidine (PEPCID) 20 MG tablet Take 20 mg by mouth 2 (two) times daily.     glucose blood (ONETOUCH VERIO) test strip Use as instructed 100 each 12   Lancets (ONETOUCH ULTRASOFT) lancets Use as instructed 100 each 3   levocetirizine (XYZAL) 5 MG tablet Take 1 tablet (5 mg total) by mouth every evening. 90 tablet 1   lidocaine-prilocaine (EMLA) cream Apply 1 application. topically as needed. Apply to port and cover with saran wrap 1-2 hours prior to port access 30 g 1   montelukast (SINGULAIR)  10 MG tablet TAKE 1 TABLET BY MOUTH EVERYDAY AT BEDTIME 90 tablet 1   Multiple Vitamin (MULTIVITAMIN WITH MINERALS) TABS tablet Take 1 tablet by mouth 2 (two) times a week.     OZEMPIC, 0.25 OR 0.5 MG/DOSE, 2 MG/1.5ML SOPN Inject 0.5 mg into the skin every Sunday.     rosuvastatin (CRESTOR) 10 MG tablet TAKE 1 TABLET (10 MG TOTAL) BY MOUTH EVERY MORNING. 90 tablet 1   spironolactone (ALDACTONE) 50 MG tablet TAKE 1 TABLET BY MOUTH EVERY DAY 90 tablet 0   betamethasone, augmented, (DIPROLENE) 0.05 % lotion Apply 1 application  topically 2 (two) times daily. (Patient not taking: Reported on 02/15/2023)     dronabinol (MARINOL) 2.5 MG capsule Take 1 capsule (2.5 mg total) by mouth daily before lunch. (Patient not taking: Reported on 11/01/2022) 60 capsule 0   EPINEPHrine (EPIPEN 2-PAK) 0.3 mg/0.3 mL IJ SOAJ injection Inject 0.3 mg into the muscle as needed for anaphylaxis. (Patient not taking: Reported on 11/01/2022) 2 each 0   ondansetron (ZOFRAN) 8 MG tablet Take 1 tablet (8 mg total) by mouth every 8 (eight) hours as needed for nausea or vomiting. (Patient not taking: Reported on 01/25/2023) 40 tablet 1   promethazine (PHENERGAN) 25 MG tablet Take 1 tablet (25 mg total) by mouth every 6 (six) hours as needed for nausea or vomiting. (Patient not taking: Reported on 12/21/2022) 40 tablet 0   rizatriptan (MAXALT-MLT) 10 MG disintegrating tablet Take 5-10 mg by mouth as needed for migraine. May repeat in 2 hours if needed (Patient not taking: Reported on 11/01/2022)     No current facility-administered medications for this visit.   Facility-Administered Medications Ordered in Other Visits  Medication Dose Route Frequency Provider Last Rate Last Admin   heparin lock flush 100 unit/mL  500 Units Intravenous Once Charlaine Dalton R, MD       heparin lock flush 100 unit/mL  500 Units Intracatheter Once PRN Cammie Sickle, MD       pembrolizumab [facility]) 200 mg in sodium chloride 0.9 % 50 mL chemo  infusion  200 mg Intravenous Once Cammie Sickle, MD          .  PHYSICAL EXAMINATION: ECOG PERFORMANCE STATUS: 0 - Asymptomatic  Vitals:   02/15/23 0928  BP: 113/64  Pulse: 75  Resp: 16  Temp: 98.1 F (36.7 C)  SpO2: 100%     Filed Weights   02/15/23 0928  Weight: 172 lb 9.6 oz (78.3 kg)      Physical Exam Vitals and nursing note reviewed.  HENT:     Head: Normocephalic and atraumatic.     Mouth/Throat:     Pharynx: Oropharynx is clear.  Eyes:     Extraocular Movements: Extraocular movements intact.     Pupils: Pupils are equal, round, and reactive to light.  Cardiovascular:     Rate and Rhythm: Normal rate and regular rhythm.  Pulmonary:     Comments: Decreased breath sounds bilaterally.  Abdominal:     Palpations: Abdomen is soft.  Musculoskeletal:        General: Normal range of motion.     Cervical back: Normal range of motion.  Skin:    General: Skin is warm.  Neurological:     General: No focal deficit present.     Mental Status: She is alert and oriented to person, place, and time.  Psychiatric:        Behavior: Behavior normal.        Judgment: Judgment normal.      LABORATORY DATA:  I have reviewed the data as listed Lab Results  Component Value Date   WBC 4.7 02/15/2023   HGB 10.0 (L) 02/15/2023   HCT 32.1 (L) 02/15/2023   MCV 93.3 02/15/2023   PLT 197 02/15/2023   Recent Labs    01/04/23 0817 01/25/23 1032 02/15/23 0833  NA 139 138 137  K 3.7 3.8 3.9  CL 108 106 104  CO2 '23 23 24  '$ GLUCOSE 111* 113* 95  BUN '15 12 14  '$ CREATININE 1.01* 0.89 0.80  CALCIUM 8.6* 9.3 9.1  GFRNONAA 60* >60 >60  PROT 6.8 7.2 7.3  ALBUMIN 3.3* 3.6 3.6  AST '21 21 22  '$ ALT '15 15 14  '$ ALKPHOS 67 82 75  BILITOT 0.3 0.5 0.5    RADIOGRAPHIC STUDIES: I have personally reviewed the radiological images as listed and agreed with the findings in the report. No results found.  ASSESSMENT & PLAN:   Carcinoma of upper-outer quadrant of right  breast in female, estrogen receptor negative ([place]) # Stage II triple negative breast cancer [mT1cN1; Grade 3 ]- s/p mastectomy.  Patient currently on adjuvant chemoimmunotherapy [id number] study]; patient s/p carboplatin weekly Taxol.  Currently on Adriamycin-Cytoxan Keytruda-s/p 4 cycles [cycle #4 reduced to Adriamycin to 40 mg/m2].   # proceed with adjuvant Keytruda #3  out of planned 9 cycles. Labs today reviewed;  acceptable for treatment today. FEB 2024- TSH- WNL.   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   # Hypomagnesemia magnesium NOV 2023- 1.7 continue mag oxide to twice a day.  vit D Sep 40. Continue ca+vit D. Stable.   # DM- Blood sugars today 95  on ozempic.  Monitor blood sugars closely. Stable.   # Hypokalemia- improved; ok to stop Kdur.   # vaccination: s/p flu shot.   # DISPOSITION: # keytruda today # follow up in 3 weeks- MD; port-labs- cbc/cmp; Keytruda- Dr.B  All questions were answered. The patient/family  knows to call the clinic with any problems, questions or concerns.    Cammie Sickle, MD 02/15/2023 10:02 AM

## 2023-02-15 NOTE — Patient Instructions (Signed)
[facility]  Discharge Instructions: Thank you for choosing [facility] to provide your oncology and hematology care.  If you have a lab appointment with the Cancer Center, please go directly to the Cancer Center and check in at the registration area.  Wear comfortable clothing and clothing appropriate for easy access to any Portacath or PICC line.   We strive to give you quality time with your provider. You may need to reschedule your appointment if you arrive late (15 or more minutes).  Arriving late affects you and other patients whose appointments are after yours.  Also, if you miss three or more appointments without notifying the office, you may be dismissed from the clinic at the provider's discretion.      For prescription refill requests, have your pharmacy contact our office and allow 72 hours for refills to be completed.    Today you received the following chemotherapy and/or immunotherapy agents- Keytruda      To help prevent nausea and vomiting after your treatment, we encourage you to take your nausea medication as directed.  BELOW ARE SYMPTOMS THAT SHOULD BE REPORTED IMMEDIATELY: *FEVER GREATER THAN 100.4 F (38 C) OR HIGHER *CHILLS OR SWEATING *NAUSEA AND VOMITING THAT IS NOT CONTROLLED WITH YOUR NAUSEA MEDICATION *UNUSUAL SHORTNESS OF BREATH *UNUSUAL BRUISING OR BLEEDING *URINARY PROBLEMS (pain or burning when urinating, or frequent urination) *BOWEL PROBLEMS (unusual diarrhea, constipation, pain near the anus) TENDERNESS IN MOUTH AND THROAT WITH OR WITHOUT PRESENCE OF ULCERS (sore throat, sores in mouth, or a toothache) UNUSUAL RASH, SWELLING OR PAIN  UNUSUAL VAGINAL DISCHARGE OR ITCHING   Items with * indicate a potential emergency and should be followed up as soon as possible or go to the Emergency Department if any problems should occur.  Please show the CHEMOTHERAPY ALERT CARD or IMMUNOTHERAPY ALERT CARD at check-in to  the Emergency Department and triage nurse.  Should you have questions after your visit or need to cancel or reschedule your appointment, please contact [facility] [place] [facility]  [phone] and follow the prompts.  Office hours are 8:00 a.m. to 4:30 p.m. Monday - Friday. Please note that voicemails left after 4:00 p.m. may not be returned until the following business day.  We are closed weekends and major holidays. You have access to a nurse at all times for urgent questions. Please call the main number to the clinic [phone] and follow the prompts.  For any non-urgent questions, you may also contact your provider using MyChart. We now offer e-Visits for anyone 18 and older to request care online for non-urgent symptoms. For details visit [email or web address].   Also download the MyChart app! Go to the app store, search "MyChart", open the app, select [facility], and log in with your MyChart username and password.   

## 2023-02-17 LAB — T4: T4, Total: 6.6 ug/dL (ref 4.5–12.0)

## 2023-03-08 ENCOUNTER — Inpatient Hospital Stay: Payer: Medicare Other

## 2023-03-08 ENCOUNTER — Inpatient Hospital Stay: Payer: Medicare Other | Attending: Oncology | Admitting: Internal Medicine

## 2023-03-08 ENCOUNTER — Encounter: Payer: Self-pay | Admitting: Internal Medicine

## 2023-03-08 VITALS — BP 106/73 | HR 81 | Temp 97.2°F | Resp 16 | Ht 66.0 in | Wt 168.8 lb

## 2023-03-08 DIAGNOSIS — E119 Type 2 diabetes mellitus without complications: Secondary | ICD-10-CM | POA: Insufficient documentation

## 2023-03-08 DIAGNOSIS — Z5112 Encounter for antineoplastic immunotherapy: Secondary | ICD-10-CM | POA: Diagnosis not present

## 2023-03-08 DIAGNOSIS — E876 Hypokalemia: Secondary | ICD-10-CM | POA: Diagnosis not present

## 2023-03-08 DIAGNOSIS — Z923 Personal history of irradiation: Secondary | ICD-10-CM | POA: Diagnosis not present

## 2023-03-08 DIAGNOSIS — Z171 Estrogen receptor negative status [ER-]: Secondary | ICD-10-CM | POA: Diagnosis not present

## 2023-03-08 DIAGNOSIS — G620 Drug-induced polyneuropathy: Secondary | ICD-10-CM | POA: Diagnosis not present

## 2023-03-08 DIAGNOSIS — Z7982 Long term (current) use of aspirin: Secondary | ICD-10-CM | POA: Insufficient documentation

## 2023-03-08 DIAGNOSIS — Z79899 Other long term (current) drug therapy: Secondary | ICD-10-CM | POA: Insufficient documentation

## 2023-03-08 DIAGNOSIS — T451X5D Adverse effect of antineoplastic and immunosuppressive drugs, subsequent encounter: Secondary | ICD-10-CM | POA: Insufficient documentation

## 2023-03-08 DIAGNOSIS — Z9221 Personal history of antineoplastic chemotherapy: Secondary | ICD-10-CM | POA: Insufficient documentation

## 2023-03-08 DIAGNOSIS — I10 Essential (primary) hypertension: Secondary | ICD-10-CM | POA: Insufficient documentation

## 2023-03-08 DIAGNOSIS — C50411 Malignant neoplasm of upper-outer quadrant of right female breast: Secondary | ICD-10-CM

## 2023-03-08 DIAGNOSIS — C50811 Malignant neoplasm of overlapping sites of right female breast: Secondary | ICD-10-CM | POA: Diagnosis not present

## 2023-03-08 DIAGNOSIS — Z87891 Personal history of nicotine dependence: Secondary | ICD-10-CM | POA: Insufficient documentation

## 2023-03-08 LAB — CBC WITH DIFFERENTIAL/PLATELET
Abs Immature Granulocytes: 0.02 10*3/uL (ref 0.00–0.07)
Basophils Absolute: 0 10*3/uL (ref 0.0–0.1)
Basophils Relative: 0 %
Eosinophils Absolute: 0.1 10*3/uL (ref 0.0–0.5)
Eosinophils Relative: 1 %
HCT: 33.2 % — ABNORMAL LOW (ref 36.0–46.0)
Hemoglobin: 10.5 g/dL — ABNORMAL LOW (ref 12.0–15.0)
Immature Granulocytes: 0 %
Lymphocytes Relative: 24 %
Lymphs Abs: 1.2 10*3/uL (ref 0.7–4.0)
MCH: 29.3 pg (ref 26.0–34.0)
MCHC: 31.6 g/dL (ref 30.0–36.0)
MCV: 92.7 fL (ref 80.0–100.0)
Monocytes Absolute: 0.4 10*3/uL (ref 0.1–1.0)
Monocytes Relative: 7 %
Neutro Abs: 3.3 10*3/uL (ref 1.7–7.7)
Neutrophils Relative %: 68 %
Platelets: 192 10*3/uL (ref 150–400)
RBC: 3.58 MIL/uL — ABNORMAL LOW (ref 3.87–5.11)
RDW: 14.6 % (ref 11.5–15.5)
WBC: 4.9 10*3/uL (ref 4.0–10.5)
nRBC: 0 % (ref 0.0–0.2)

## 2023-03-08 LAB — COMPREHENSIVE METABOLIC PANEL
ALT: 17 U/L (ref 0–44)
AST: 24 U/L (ref 15–41)
Albumin: 3.6 g/dL (ref 3.5–5.0)
Alkaline Phosphatase: 79 U/L (ref 38–126)
Anion gap: 6 (ref 5–15)
BUN: 17 mg/dL (ref 8–23)
CO2: 24 mmol/L (ref 22–32)
Calcium: 9.1 mg/dL (ref 8.9–10.3)
Chloride: 106 mmol/L (ref 98–111)
Creatinine, Ser: 0.85 mg/dL (ref 0.44–1.00)
GFR, Estimated: >60 mL/min (ref >60)
Glucose, Bld: 136 mg/dL — ABNORMAL HIGH (ref 70–99)
Potassium: 4.1 mmol/L (ref 3.5–5.1)
Sodium: 136 mmol/L (ref 135–145)
Total Bilirubin: 0.5 mg/dL (ref 0.3–1.2)
Total Protein: 7.2 g/dL (ref 6.5–8.1)

## 2023-03-08 MED ORDER — SODIUM CHLORIDE 0.9 % IV SOLN
200.0000 mg | Freq: Once | INTRAVENOUS | Status: AC
  Administered 2023-03-08: 200 mg via INTRAVENOUS
  Filled 2023-03-08: qty 8

## 2023-03-08 MED ORDER — HEPARIN SOD (PORK) LOCK FLUSH 100 UNIT/ML IV SOLN
500.0000 [IU] | Freq: Once | INTRAVENOUS | Status: AC | PRN
  Administered 2023-03-08: 500 [IU]
  Filled 2023-03-08: qty 5

## 2023-03-08 MED ORDER — SODIUM CHLORIDE 0.9 % IV SOLN
Freq: Once | INTRAVENOUS | Status: AC
  Filled 2023-03-08: qty 250

## 2023-03-08 NOTE — Progress Notes (Signed)
Patient reports decrease in appetite with 4 lb wt loss (does take Ozempic).  Food is starting to taste different.  Neuropathy in hands is getting worse with numbness.  Still doing acupuncture with little relief.    Constipation worse after last treatment.    Occasional epigastric pain and does take Pepto Bismol prn.

## 2023-03-08 NOTE — Progress Notes (Signed)
[facility] CONSULT NOTE  Patient Care Team: Steele Sizer, MD as PCP - General (Family Medicine) Bary Castilla, Forest Gleason, MD (General Surgery) Carloyn Manner, MD as Referring Physician (Otolaryngology) Yolonda Kida, MD as Consulting Physician (Cardiology) Ree Edman, MD as Referring Physician (Dermatology) Lonia Farber, MD as Consulting Physician (Internal Medicine) Cammie Sickle, MD as Consulting Physician (Internal Medicine)  CHIEF COMPLAINTS/PURPOSE OF CONSULTATION: Breast cancer  #  Oncology History Overview Note  She has a remote history of right breast cancer, T1bN0, ER positive,s/p lumpectomy and MammoSite radiation and 5 years of Arimidex.    02/19/22 screening mammogram bilaterally showed  1. Suspicious right breast mass at the 10 o'clock position 8 cm from the nipple. Recommend ultrasound-guided biopsy. 2. Indeterminate right breast mass at the 6 o'clock position 4 cm from the nipple. Recommend ultrasound-guided biopsy. 3. No suspicious right axillary lymphadenopathy.   04/12/2022 Unilateral right breast diagnostic mammogram showed 1. Suspicious right breast mass at the 10 o'clock position 8 cm from the nipple. Recommend ultrasound-guided biopsy. 2. Indeterminate right breast mass at the 6 o'clock position 4 cm from the nipple. Recommend ultrasound-guided biopsy. 3. No suspicious right axillary lymphadenopathy.   04/13/2022 right breast mass 6 o'clock position 1 cm from the nipple showed invasive mammary carcinoma, no special type. Grade 3, DCIS present high grade, LVI not identified. ER-, PR 1-10%, HER 2 -   04/13/2022, right breast mass 10:00 7 cm from nipple biopsy showed invasive mammary carcinoma, grade 3, high-grade DCIS with focal comedonecrosis, lymphovascular invasion not identified.  ER -, PR 1 to 10%, HER2-    04/26/2022, right breast mastectomy with axillary dissection Invasive mammary carcinoma, no special type, multifocal, DCIS  high-grade, benign nipple/areola.  2 deposits of invasive mammary carcinoma, no definite residual lymph node identified.   Breast cancer ([place])  05/10/2022 Initial Diagnosis   Breast cancer ([place])   05/10/2022 Cancer Staging   Staging form: Breast, AJCC 8th Edition - Pathologic stage from 05/10/2022: Stage IIA (pT1c, pN1a, cM0, G3, ER-, PR+, HER2-) - Signed by Earlie Server, MD on 05/10/2022 Stage prefix: Initial diagnosis Histologic grading system: 3 grade system   05/24/2022 - 05/24/2022 Chemotherapy   Patient is on Treatment Plan : BREAST Pembrolizumab (200) D1 + Carboplatin (5) D1 + Paclitaxel (80) D1,8,15 q21d X 4 cycles / Pembrolizumab (200) D1 + AC D1 q21d x 4 cycles     05/27/2022 - 08/13/2022 Chemotherapy   Patient is on Treatment Plan : BREAST Pembrolizumab (200) D1 + Carboplatin (5) D1 + Paclitaxel (80) D1,8,15 q21d X 4 cycles / Pembrolizumab (200) D1 + AC D1 q21d x 4 cycles     05/27/2022 - 12/07/2022 Chemotherapy   Patient is on Treatment Plan : BREAST Pembrolizumab (200) D1 + Carboplatin (5) D1 + Paclitaxel (80) D1,8,15 q21d X 4 cycles / Pembrolizumab (200) D1 + AC D1 q21d x 4 cycles     01/04/2023 -  Chemotherapy   Patient is on Treatment Plan : BREAST Pembrolizumab (200) q21d x 27 weeks     Carcinoma of upper-outer quadrant of right breast in female, estrogen receptor negative ([place])  05/26/2022 Initial Diagnosis   Carcinoma of upper-outer quadrant of right breast in female, estrogen receptor negative ([place])   05/26/2022 Cancer Staging   Staging form: Breast, AJCC 8th Edition - Pathologic: Stage IIA (pT1c, pN1, cM0, G3, ER-, PR-, HER2-) - Signed by Cammie Sickle, MD on 05/26/2022 Multigene prognostic tests performed: None Histologic grading system: 3 grade system  05/27/2022 - 12/07/2022 Chemotherapy   Patient is on Treatment Plan : BREAST Pembrolizumab (200) D1 + Carboplatin (5) D1 + Paclitaxel (80) D1,8,15 q21d X 4 cycles / Pembrolizumab (200) D1 + AC D1 q21d x 4 cycles     01/04/2023  -  Chemotherapy   Patient is on Treatment Plan : BREAST Pembrolizumab (200) q21d x 27 weeks      HISTORY OF PRESENTING ILLNESS: Ambulating independently.  Alone.   Morgan Peterson 72 y.o.  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   Patient reports decrease in appetite with 4 lb wt loss (does take Ozempic).  Food is starting to taste different.    Neuropathy in hands is getting worse with numbness.  Still doing acupuncture with little relief.     Constipation worse after last treatment.     Occasional epigastric pain and does take Pepto Bismol prn  Denies any nausea . No fever no chills.   Review of Systems  Constitutional:  Positive for malaise/fatigue. Negative for chills, diaphoresis, fever and weight loss.  HENT:  Negative for nosebleeds and sore throat.   Eyes:  Negative for double vision.  Respiratory:  Negative for cough, hemoptysis, sputum production, shortness of breath and wheezing.   Cardiovascular:  Negative for chest pain, palpitations, orthopnea and leg swelling.  Gastrointestinal:  Negative for abdominal pain, blood in stool, constipation, diarrhea, heartburn, melena, nausea and vomiting.  Genitourinary:  Negative for dysuria, frequency and urgency.  Musculoskeletal:  Negative for back pain and joint pain.  Skin: Negative.  Negative for itching and rash.  Neurological:  Positive for tingling. Negative for dizziness, focal weakness, weakness and headaches.  Endo/Heme/Allergies:  Does not bruise/bleed easily.  Psychiatric/Behavioral:  Negative for depression. The patient is not nervous/anxious and does not have insomnia.      MEDICAL HISTORY:  Past Medical History:  Diagnosis Date   Appetite loss    Asthma    Cervical radiculopathy    Chemotherapy-induced nausea    Hiatal hernia    Hypercholesterolemia    Hyperlipidemia     Hypertension    IBS (irritable bowel syndrome)    Malignant neoplasm of upper-outer quadrant of female breast ([place]) 04/11/2009   Right breast, invasive ductal carcinoma, 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 Arimidex therapy completed September 2015   Mild mitral valve prolapse    Neuropathy    feet, from chemo   Personal history of malignant neoplasm of breast 2010   Personal history of radiation therapy 2010   mammosite   T2DM (type 2 diabetes mellitus) ([place]) 2011    SURGICAL HISTORY: Past Surgical History:  Procedure Laterality Date   ABDOMINAL EXPLORATION SURGERY  2000   ovarian cyst   BREAST BIOPSY Right 2010   +    BREAST EXCISIONAL BIOPSY Right 2010   + mammo site inasive mammo ca   BREAST LUMPECTOMY Right 2010   [facility]   COLONOSCOPY  07/19/2012   Normal exam, Dr. Bary Castilla   COLONOSCOPY WITH PROPOFOL N/A 04/21/2022   Procedure: COLONOSCOPY WITH PROPOFOL;  Surgeon: Robert Bellow, MD;  Location: [facility];  Service: Endoscopy;  Laterality: N/A;   ESOPHAGOGASTRODUODENOSCOPY (EGD) WITH PROPOFOL N/A 10/22/2022   Procedure: ESOPHAGOGASTRODUODENOSCOPY (EGD) WITH PROPOFOL;  Surgeon: Lucilla Lame, MD;  Location: [facility];  Service: Endoscopy;  Laterality: N/A;  Diabetic  MASTECTOMY Right 2010   partial/ lumpectomy   PORTACATH PLACEMENT Left 05/19/2022   Procedure: INSERTION PORT-A-CATH;  Surgeon: Robert Bellow, MD;  Location: [facility];  Service: General;  Laterality: Left;   SIMPLE MASTECTOMY WITH AXILLARY SENTINEL NODE BIOPSY Right 04/26/2022   Procedure: SIMPLE MASTECTOMY WITH AXILLARY SENTINEL NODE BIOPSY;  Surgeon: Robert Bellow, MD;  Location: [facility];  Service: General;  Laterality: Right;  RNFA to assist   TUBAL LIGATION  20 years ago    SOCIAL HISTORY: Social History   Socioeconomic History   Marital status: Married    Spouse name: Antonio   Number of children: 2   Years of education:  Not on file   Highest education level: Not on file  Occupational History   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  Tobacco Use   Smoking status: Former    Packs/day: 0.25    Years: 10.00    Additional pack years: 0.00    Total pack years: 2.50    Types: Cigarettes    Start date: 1978    Quit date: 1988    Years since quitting: 36.2   Smokeless tobacco: Never   Tobacco comments:    smoking cessation materials not required  Vaping Use   Vaping Use: Never used  Substance and Sexual Activity   Alcohol use: Yes    Comment: wine occ   Drug use: No   Sexual activity: Not Currently    Comment: husband has ED  Other Topics Concern   Not on file  Social History Narrative   Oncology nurse on the floor retired.;  Husband retired from [employer].  No smoking.   Social Determinants of Health   Financial Resource Strain: Low Risk  (12/31/2021)   Overall Financial Resource Strain (CARDIA)    Difficulty of Paying Living Expenses: Not hard at all  Food Insecurity: No Food Insecurity (12/31/2021)   Hunger Vital Sign    Worried About Running Out of Food in the Last Year: Never true    Ran Out of Food in the Last Year: Never true  Transportation Needs: No Transportation Needs (12/31/2021)   PRAPARE - Hydrologist (Medical): No    Lack of Transportation (Non-Medical): No  Physical Activity: Inactive (12/31/2021)   Exercise Vital Sign    Days of Exercise per Week: 0 days    Minutes of Exercise per Session: 0 min  Stress: No Stress Concern Present (12/31/2021)   [place]    Feeling of Stress : Only a little  Social Connections: Moderately Integrated (12/31/2021)   Social Connection and Isolation Panel [NHANES]    Frequency of Communication with Friends and Family: More than three times a week    Frequency of Social Gatherings with Friends and Family: Three times a week    Attends Religious  Services: More than 4 times per year    Active Member of Clubs or Organizations: No    Attends Archivist Meetings: Never    Marital Status: Married  Human resources officer Violence: Not At Risk (12/31/2021)   Humiliation, Afraid, Rape, and Kick questionnaire    Fear of Current or Ex-Partner: No    Emotionally Abused: No    Physically Abused: No    Sexually Abused: No    FAMILY HISTORY: Family History  Problem Relation Age of Onset   Osteoporosis Mother    Diabetes Father    Kidney disease  Father    Diabetes Brother    Breast cancer Maternal Aunt    Bladder Cancer Maternal Aunt    Cervical cancer Maternal Aunt    Colon cancer Maternal Uncle    Lung cancer Maternal Uncle     ALLERGIES:  is allergic to levaquin [levofloxacin in d5w], losartan, metoprolol, saxagliptin, amlodipine-olmesartan, canagliflozin, egg white [egg white (egg protein)], gabapentin, glipizide, gluten meal, lisinopril, metformin and related, milk (cow), milk-related compounds, shrimp extract, tramadol, zyprexa [olanzapine], actos [pioglitazone], atorvastatin, carvedilol, and prednisone.  MEDICATIONS:  Current Outpatient Medications  Medication Sig Dispense Refill   aspirin EC 81 MG tablet Take 1 tablet (81 mg total) by mouth daily. 30 tablet 0   atenolol (TENORMIN) 25 MG tablet Take 1 tablet (25 mg total) by mouth daily. 90 tablet 0   Blood Glucose Monitoring Suppl (ONETOUCH VERIO) w/Device KIT 1 Device by Does not apply route once. 1 kit 0   cholecalciferol (VITAMIN D) 1000 UNITS tablet Take 1,000 Units by mouth daily.     Cyanocobalamin (VITAMIN B-12 PO) Take 1 tablet by mouth daily as needed (fatigue).     famotidine (PEPCID) 20 MG tablet Take 20 mg by mouth 2 (two) times daily.     glucose blood (ONETOUCH VERIO) test strip Use as instructed 100 each 12   Lancets (ONETOUCH ULTRASOFT) lancets Use as instructed 100 each 3   levocetirizine (XYZAL) 5 MG tablet Take 1 tablet (5 mg total) by mouth every  evening. 90 tablet 1   lidocaine-prilocaine (EMLA) cream Apply 1 application. topically as needed. Apply to port and cover with saran wrap 1-2 hours prior to port access 30 g 1   montelukast (SINGULAIR) 10 MG tablet TAKE 1 TABLET BY MOUTH EVERYDAY AT BEDTIME 90 tablet 1   Multiple Vitamin (MULTIVITAMIN WITH MINERALS) TABS tablet Take 1 tablet by mouth 2 (two) times a week.     OZEMPIC, 0.25 OR 0.5 MG/DOSE, 2 MG/1.5ML SOPN Inject 0.5 mg into the skin every Sunday.     rosuvastatin (CRESTOR) 10 MG tablet TAKE 1 TABLET (10 MG TOTAL) BY MOUTH EVERY MORNING. 90 tablet 1   spironolactone (ALDACTONE) 50 MG tablet TAKE 1 TABLET BY MOUTH EVERY DAY 90 tablet 0   betamethasone, augmented, (DIPROLENE) 0.05 % lotion Apply 1 application  topically 2 (two) times daily. (Patient not taking: Reported on 02/15/2023)     dronabinol (MARINOL) 2.5 MG capsule Take 1 capsule (2.5 mg total) by mouth daily before lunch. (Patient not taking: Reported on 11/01/2022) 60 capsule 0   EPINEPHrine (EPIPEN 2-PAK) 0.3 mg/0.3 mL IJ SOAJ injection Inject 0.3 mg into the muscle as needed for anaphylaxis. (Patient not taking: Reported on 11/01/2022) 2 each 0   ondansetron (ZOFRAN) 8 MG tablet Take 1 tablet (8 mg total) by mouth every 8 (eight) hours as needed for nausea or vomiting. (Patient not taking: Reported on 01/25/2023) 40 tablet 1   promethazine (PHENERGAN) 25 MG tablet Take 1 tablet (25 mg total) by mouth every 6 (six) hours as needed for nausea or vomiting. (Patient not taking: Reported on 12/21/2022) 40 tablet 0   rizatriptan (MAXALT-MLT) 10 MG disintegrating tablet Take 5-10 mg by mouth as needed for migraine. May repeat in 2 hours if needed (Patient not taking: Reported on 11/01/2022)     No current facility-administered medications for this visit.   Facility-Administered Medications Ordered in Other Visits  Medication Dose Route Frequency Provider Last Rate Last Admin   heparin lock flush 100 unit/mL  500 Units Intravenous  Once Cammie Sickle, MD       heparin lock flush 100 unit/mL  500 Units Intracatheter Once PRN Cammie Sickle, MD       pembrolizumab [facility]) 200 mg in sodium chloride 0.9 % 50 mL chemo infusion  200 mg Intravenous Once Cammie Sickle, MD          .  PHYSICAL EXAMINATION: ECOG PERFORMANCE STATUS: 0 - Asymptomatic  Vitals:   03/08/23 1300  BP: 106/73  Pulse: 81  Resp: 16  Temp: (!) 97.2 F (36.2 C)     Filed Weights   03/08/23 1300  Weight: 168 lb 12.8 oz (76.6 kg)      Physical Exam Vitals and nursing note reviewed.  HENT:     Head: Normocephalic and atraumatic.     Mouth/Throat:     Pharynx: Oropharynx is clear.  Eyes:     Extraocular Movements: Extraocular movements intact.     Pupils: Pupils are equal, round, and reactive to light.  Cardiovascular:     Rate and Rhythm: Normal rate and regular rhythm.  Pulmonary:     Comments: Decreased breath sounds bilaterally.  Abdominal:     Palpations: Abdomen is soft.  Musculoskeletal:        General: Normal range of motion.     Cervical back: Normal range of motion.  Skin:    General: Skin is warm.  Neurological:     General: No focal deficit present.     Mental Status: She is alert and oriented to person, place, and time.  Psychiatric:        Behavior: Behavior normal.        Judgment: Judgment normal.      LABORATORY DATA:  I have reviewed the data as listed Lab Results  Component Value Date   WBC 4.9 03/08/2023   HGB 10.5 (L) 03/08/2023   HCT 33.2 (L) 03/08/2023   MCV 92.7 03/08/2023   PLT 192 03/08/2023   Recent Labs    01/25/23 1032 02/15/23 0833 03/08/23 1243  NA 138 137 136  K 3.8 3.9 4.1  CL 106 104 106  CO2 23 24 24   GLUCOSE 113* 95 136*  BUN 12 14 17   CREATININE 0.89 0.80 0.85  CALCIUM 9.3 9.1 9.1  GFRNONAA >60 >60 >60  PROT 7.2 7.3 7.2  ALBUMIN 3.6 3.6 3.6  AST 21 22 24   ALT 15 14 17   ALKPHOS 82 75 79  BILITOT 0.5 0.5 0.5    RADIOGRAPHIC STUDIES: I  have personally reviewed the radiological images as listed and agreed with the findings in the report. No results found.  ASSESSMENT & PLAN:   Carcinoma of upper-outer quadrant of right breast in female, estrogen receptor negative ([place]) # Stage II triple negative breast cancer [mT1cN1; Grade 3 ]- s/p mastectomy.  Patient currently on adjuvant chemoimmunotherapy [id number] study]; patient s/p carboplatin weekly Taxol.  Currently on Adriamycin-Cytoxan Keytruda-s/p 4 cycles [cycle #4 reduced to Adriamycin to 40 mg/m2].   # proceed with adjuvant Keytruda #4 out of planned 9 cycles. Labs today reviewed;  acceptable for treatment today. FEB 2024- TSH- WNL.   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   # Hypomagnesemia magnesium NOV 2023- 1.7 continue mag oxide to twice a day.  vit D Sep 40. Continue ca+vit D.Stable.   # DM- on  ozempic.  Monitor blood sugars closely. Stable.   # Hypokalemia- improved;  ok to stop Kdur. Stable.   # vaccination: s/p flu shot.   # DISPOSITION: # keytruda today # follow up in 3 weeks- MD; port-labs- cbc/cmp; Keytruda- Dr.B  All questions were answered. The patient/family knows to call the clinic with any problems, questions or concerns.    Cammie Sickle, MD 03/08/2023 1:56 PM

## 2023-03-08 NOTE — Assessment & Plan Note (Addendum)
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  Patient currently on adjuvant chemoimmunotherapy [id number] study]; patient s/p carboplatin weekly Taxol.  Currently on Adriamycin-Cytoxan Keytruda-s/p 4 cycles [cycle #4 reduced to Adriamycin to 40 mg/m2].   # proceed with adjuvant Keytruda #4 out of planned 9 cycles. Labs today reviewed;  acceptable for treatment today. FEB 2024- TSH- WNL.   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   # Hypomagnesemia magnesium NOV 2023- 1.7 continue mag oxide to twice a day.  vit D Sep 40. Continue ca+vit D.Stable.   # DM- on  ozempic.  Monitor blood sugars closely. Stable.   # Hypokalemia- improved; ok to stop Kdur. Stable.   # vaccination: s/p flu shot.   # DISPOSITION: # keytruda today # follow up in 3 weeks- MD; port-labs- cbc/cmp; Keytruda- Dr.B

## 2023-03-11 ENCOUNTER — Other Ambulatory Visit: Payer: Self-pay | Admitting: Family Medicine

## 2023-03-11 DIAGNOSIS — I10 Essential (primary) hypertension: Secondary | ICD-10-CM

## 2023-03-11 NOTE — Progress Notes (Deleted)
Name: Morgan Peterson   MRN: [id number]    DOB: 1951/03/23   Date:03/11/2023       Progress Note  Subjective  Chief Complaint  Follow up   HPI  HTN: she used to take triamterene hctz but was having hypokalemia even while taking potassium, we switched to spironolactone and potassium is back to normal but she is noticing swelling on both ankles. We will adjust dose of spironolactone to 50 mg and monitor. 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 but stopped due to tingling in her tongue, is now going to start Duloxetine   Hives: secondary to food allergy , she needs a refill of epipen Patient Active Problem List   Diagnosis Date Noted   Melena 10/22/2022   Other diseases of stomach and duodenum 10/22/2022   Chemotherapy-induced neuropathy ([place]) 08/27/2022   Bilateral lower extremity edema 08/27/2022   Hives 08/27/2022   Food allergy 08/05/2022   Atherosclerosis of both carotid arteries 08/05/2022   Migraine aura without headache 08/05/2022   Carcinoma of upper-outer quadrant of right breast in female, estrogen receptor negative ([place]) 05/26/2022   Breast cancer ([place]) 05/10/2022   Goals of care, counseling/discussion 05/10/2022   Encounter for monitoring cardiotoxic drug therapy 05/10/2022   Carotid stenosis 04/21/2022   Hypertension associated with type 2 diabetes mellitus ([place]) 01/20/2021   Intermittent low back pain 08/18/2017   Irritable bowel syndrome with constipation 05/02/2017   Vitamin D deficiency 09/09/2016   Hiatal hernia 06/02/2016   Allergic rhinitis, seasonal 06/02/2016   Obesity (BMI 30-39.9) 04/30/2016   Angio-edema 04/30/2016   Hyperlipidemia 11/04/2015   Hyperlipidemia due to type 2 diabetes mellitus ([place]) 11/04/2015   Cervical radiculopathy at C6 07/31/2015   Neuroma digital nerve 07/31/2015   Dyslipidemia associated with type 2 diabetes mellitus ([place]) 06/30/2015   Cervical disc  disease 06/30/2015   Essential hypertension 06/30/2015   History of breast cancer in female 03/23/2009    Past Surgical History:  Procedure Laterality Date   ABDOMINAL EXPLORATION SURGERY  2000   ovarian cyst   BREAST BIOPSY Right 2010   +    BREAST EXCISIONAL BIOPSY Right 2010   + mammo site inasive mammo ca   BREAST LUMPECTOMY Right 2010   [facility]   COLONOSCOPY  07/19/2012   Normal exam, Dr. Bary Castilla   COLONOSCOPY WITH PROPOFOL N/A 04/21/2022   Procedure: COLONOSCOPY WITH PROPOFOL;  Surgeon: Robert Bellow, MD;  Location: [facility];  Service: Endoscopy;  Laterality: N/A;   ESOPHAGOGASTRODUODENOSCOPY (EGD) WITH PROPOFOL N/A 10/22/2022   Procedure: ESOPHAGOGASTRODUODENOSCOPY (EGD) WITH PROPOFOL;  Surgeon: Lucilla Lame, MD;  Location: [facility];  Service: Endoscopy;  Laterality: N/A;  Diabetic   MASTECTOMY Right 2010   partial/ lumpectomy   PORTACATH PLACEMENT Left 05/19/2022   Procedure: INSERTION PORT-A-CATH;  Surgeon: Robert Bellow, MD;  Location: [facility];  Service: General;  Laterality: Left;   SIMPLE MASTECTOMY WITH AXILLARY SENTINEL NODE BIOPSY Right 04/26/2022   Procedure: SIMPLE MASTECTOMY WITH AXILLARY SENTINEL NODE BIOPSY;  Surgeon: Robert Bellow, MD;  Location: [facility];  Service: General;  Laterality: Right;  RNFA to assist   TUBAL LIGATION  20 years ago    Family History  Problem Relation Age of Onset   Osteoporosis Mother    Diabetes Father    Kidney disease Father    Diabetes Brother    Breast cancer Maternal Aunt    Bladder Cancer  Maternal Aunt    Cervical cancer Maternal Aunt    Colon cancer Maternal Uncle    Lung cancer Maternal Uncle     Social History   Tobacco Use   Smoking status: Former    Packs/day: 0.25    Years: 10.00    Additional pack years: 0.00    Total pack years: 2.50    Types: Cigarettes    Start date: 87    Quit date: 1988    Years since quitting: 36.2   Smokeless tobacco: Never   Tobacco comments:     smoking cessation materials not required  Substance Use Topics   Alcohol use: Yes    Comment: wine occ     Current Outpatient Medications:    aspirin EC 81 MG tablet, Take 1 tablet (81 mg total) by mouth daily., Disp: 30 tablet, Rfl: 0   atenolol (TENORMIN) 25 MG tablet, TAKE 1 TABLET (25 MG TOTAL) BY MOUTH DAILY., Disp: 90 tablet, Rfl: 0   betamethasone, augmented, (DIPROLENE) 0.05 % lotion, Apply 1 application  topically 2 (two) times daily. (Patient not taking: Reported on 02/15/2023), Disp: , Rfl:    Blood Glucose Monitoring Suppl (ONETOUCH VERIO) w/Device KIT, 1 Device by Does not apply route once., Disp: 1 kit, Rfl: 0   cholecalciferol (VITAMIN D) 1000 UNITS tablet, Take 1,000 Units by mouth daily., Disp: , Rfl:    Cyanocobalamin (VITAMIN B-12 PO), Take 1 tablet by mouth daily as needed (fatigue)., Disp: , Rfl:    dronabinol (MARINOL) 2.5 MG capsule, Take 1 capsule (2.5 mg total) by mouth daily before lunch. (Patient not taking: Reported on 11/01/2022), Disp: 60 capsule, Rfl: 0   EPINEPHrine (EPIPEN 2-PAK) 0.3 mg/0.3 mL IJ SOAJ injection, Inject 0.3 mg into the muscle as needed for anaphylaxis. (Patient not taking: Reported on 11/01/2022), Disp: 2 each, Rfl: 0   famotidine (PEPCID) 20 MG tablet, Take 20 mg by mouth 2 (two) times daily., Disp: , Rfl:    glucose blood (ONETOUCH VERIO) test strip, Use as instructed, Disp: 100 each, Rfl: 12   Lancets (ONETOUCH ULTRASOFT) lancets, Use as instructed, Disp: 100 each, Rfl: 3   levocetirizine (XYZAL) 5 MG tablet, Take 1 tablet (5 mg total) by mouth every evening., Disp: 90 tablet, Rfl: 1   lidocaine-prilocaine (EMLA) cream, Apply 1 application. topically as needed. Apply to port and cover with saran wrap 1-2 hours prior to port access, Disp: 30 g, Rfl: 1   montelukast (SINGULAIR) 10 MG tablet, TAKE 1 TABLET BY MOUTH EVERYDAY AT BEDTIME, Disp: 90 tablet, Rfl: 1   Multiple Vitamin (MULTIVITAMIN WITH MINERALS) TABS tablet, Take 1 tablet by mouth 2  (two) times a week., Disp: , Rfl:    ondansetron (ZOFRAN) 8 MG tablet, Take 1 tablet (8 mg total) by mouth every 8 (eight) hours as needed for nausea or vomiting. (Patient not taking: Reported on 01/25/2023), Disp: 40 tablet, Rfl: 1   OZEMPIC, 0.25 OR 0.5 MG/DOSE, 2 MG/1.5ML SOPN, Inject 0.5 mg into the skin every Sunday., Disp: , Rfl:    promethazine (PHENERGAN) 25 MG tablet, Take 1 tablet (25 mg total) by mouth every 6 (six) hours as needed for nausea or vomiting. (Patient not taking: Reported on 12/21/2022), Disp: 40 tablet, Rfl: 0   rizatriptan (MAXALT-MLT) 10 MG disintegrating tablet, Take 5-10 mg by mouth as needed for migraine. May repeat in 2 hours if needed (Patient not taking: Reported on 11/01/2022), Disp: , Rfl:    rosuvastatin (CRESTOR) 10 MG tablet, TAKE 1  TABLET (10 MG TOTAL) BY MOUTH EVERY MORNING., Disp: 90 tablet, Rfl: 1   spironolactone (ALDACTONE) 50 MG tablet, TAKE 1 TABLET BY MOUTH EVERY DAY, Disp: 90 tablet, Rfl: 0 No current facility-administered medications for this visit.  Facility-Administered Medications Ordered in Other Visits:    heparin lock flush 100 unit/mL, 500 Units, Intravenous, Once, Brahmanday, Elisha Headland, MD  Allergies  Allergen Reactions   Levaquin [Levofloxacin In D5w] Swelling    Other reaction(s): Arthralgia (Joint Pain)   Losartan     Other reaction(s): Angioedema   Metoprolol Swelling    Lip and tongue tingling and numbness   Saxagliptin Other (See Comments)    Increased PVC's  Other reaction(s): irregular heart rate   Amlodipine-Olmesartan    Canagliflozin Other (See Comments)    Bladder pain   Egg White [Egg White (Egg Protein)]     Unknown reaction   Gabapentin     cns side effects.   Glipizide     Other reaction(s): Abdominal pain   Gluten Meal    Lisinopril Swelling    Angioedema   Metformin And Related Nausea Only   Milk (Cow)    Milk-Related Compounds    Shrimp Extract     Other reaction(s): Other (See Comments)   Tramadol Nausea  And Vomiting   Zyprexa [Olanzapine]    Actos [Pioglitazone] Other (See Comments) and Nausea And Vomiting    Bladder pain   Atorvastatin Other (See Comments)    Other reaction(s): Abdominal Pain, Other (See Comments)   Carvedilol Other (See Comments)    NUMBNESS, TINGLING, ACHING IN ARMS   Prednisone Palpitations    I personally reviewed {Reviewed:[id number]} with the patient/caregiver today.   ROS  ***  Objective  There were no vitals filed for this visit.  There is no height or weight on file to calculate BMI.  Physical Exam ***  Recent Results (from the past 2160 hour(s))  Comprehensive metabolic panel     Status: Abnormal   Collection Time: 12/17/22  1:07 PM  Result Value Ref Range   Sodium 136 135 - 145 mmol/L   Potassium 4.3 3.5 - 5.1 mmol/L   Chloride 103 98 - 111 mmol/L   CO2 23 22 - 32 mmol/L   Glucose, Bld 161 (H) 70 - 99 mg/dL    Comment: Glucose reference range applies only to samples taken after fasting for at least 8 hours.   BUN 27 (H) 8 - 23 mg/dL   Creatinine, Ser 0.98 0.44 - 1.00 mg/dL   Calcium 9.1 8.9 - 10.3 mg/dL   Total Protein 8.0 6.5 - 8.1 g/dL   Albumin 3.4 (L) 3.5 - 5.0 g/dL   AST 58 (H) 15 - 41 U/L   ALT 57 (H) 0 - 44 U/L   Alkaline Phosphatase 64 38 - 126 U/L   Total Bilirubin 0.4 0.3 - 1.2 mg/dL   GFR, Estimated >60 >60 mL/min    Comment: (NOTE) Calculated using the CKD-EPI Creatinine Equation (2021)    Anion gap 10 5 - 15    Comment: Performed at [facility], [address] [place], [place] [address]  CBC with Differential     Status: Abnormal   Collection Time: 12/17/22  1:07 PM  Result Value Ref Range   WBC 0.3 (LL) 4.0 - 10.5 K/uL    Comment: REPEATED TO VERIFY THIS CRITICAL RESULT HAS VERIFIED AND BEEN CALLED TO [facility],E BY CHASE ISLEY ON 12 29 2023 AT 1333, AND HAS BEEN READ BACK.  RBC 3.38 (L) 3.87 - 5.11 MIL/uL   Hemoglobin 9.6 (L) 12.0 - 15.0 g/dL   HCT 29.7 (L) 36.0 - 46.0 %   MCV 87.9 80.0 - 100.0 fL   MCH  28.4 26.0 - 34.0 pg   MCHC 32.3 30.0 - 36.0 g/dL   RDW 14.7 11.5 - 15.5 %   Platelets 39 (L) 150 - 400 K/uL   nRBC 0.0 0.0 - 0.2 %   Neutrophils Relative % 32 %   Neutro Abs 0.1 (LL) 1.7 - 7.7 K/uL    Comment: REPEATED TO VERIFY THIS CRITICAL RESULT HAS VERIFIED AND BEEN CALLED TO [facility],E BY CHASE ISLEY ON 12 29 2023 AT 1335, AND HAS BEEN READ BACK.     Lymphocytes Relative 30 %   Lymphs Abs 0.1 (L) 0.7 - 4.0 K/uL   Monocytes Relative 30 %   Monocytes Absolute 0.1 0.1 - 1.0 K/uL   Eosinophils Relative 4 %   Eosinophils Absolute 0.0 0.0 - 0.5 K/uL   Basophils Relative 0 %   Basophils Absolute 0.0 0.0 - 0.1 K/uL   Immature Granulocytes 4 %   Abs Immature Granulocytes 0.01 0.00 - 0.07 K/uL    Comment: Performed at [facility], [address], [place], [place] [address]  Urine culture     Status: Abnormal   Collection Time: 12/17/22  2:10 PM   Specimen: Urine, Clean Catch  Result Value Ref Range   Specimen Description      URINE, CLEAN CATCH Performed at [facility], [address], [place], [place] [address]    Special Requests      NONE Performed at [facility], [address] [place], [place] [address]    Culture (A)     <10,000 COLONIES/mL INSIGNIFICANT GROWTH Performed at [facility], [place] [address], [place], [place] [address]    Report Status 12/23/2022 FINAL   Urinalysis, Complete w Microscopic     Status: Abnormal   Collection Time: 12/17/22  2:10 PM  Result Value Ref Range   Color, Urine AMBER (A) YELLOW    Comment: BIOCHEMICALS MAY BE AFFECTED BY COLOR   APPearance CLOUDY (A) CLEAR   Specific Gravity, Urine 1.028 1.005 - 1.030   pH 5.0 5.0 - 8.0   Glucose, UA NEGATIVE NEGATIVE mg/dL   Hgb urine dipstick NEGATIVE NEGATIVE   Bilirubin Urine NEGATIVE NEGATIVE   Ketones, ur 5 (A) NEGATIVE mg/dL   Protein, ur 30 (A) NEGATIVE mg/dL   Nitrite NEGATIVE NEGATIVE   Leukocytes,Ua NEGATIVE NEGATIVE   RBC / HPF 0-5 0 - 5 RBC/hpf   WBC,  UA 6-10 0 - 5 WBC/hpf   Bacteria, UA RARE (A) NONE SEEN   Squamous Epithelial / HPF 6-10 0 - 5 /HPF   Mucus PRESENT    Hyaline Casts, UA PRESENT     Comment: Performed at [facility], [address] [place], [place] [address]  Comprehensive metabolic panel     Status: Abnormal   Collection Time: 12/21/22 11:06 AM  Result Value Ref Range   Sodium 137 135 - 145 mmol/L   Potassium 4.0 3.5 - 5.1 mmol/L   Chloride 104 98 - 111 mmol/L   CO2 21 (L) 22 - 32 mmol/L   Glucose, Bld 178 (H) 70 - 99 mg/dL    Comment: Glucose reference range applies only to samples taken after fasting for at least 8 hours.   BUN 11 8 - 23 mg/dL   Creatinine, Ser 0.94  0.44 - 1.00 mg/dL   Calcium 8.8 (L) 8.9 - 10.3 mg/dL   Total Protein 7.1 6.5 - 8.1 g/dL   Albumin 3.3 (L) 3.5 - 5.0 g/dL   AST 26 15 - 41 U/L   ALT 33 0 - 44 U/L   Alkaline Phosphatase 65 38 - 126 U/L   Total Bilirubin 0.5 0.3 - 1.2 mg/dL   GFR, Estimated >60 >60 mL/min    Comment: (NOTE) Calculated using the CKD-EPI Creatinine Equation (2021)    Anion gap 12 5 - 15    Comment: Performed at [facility], [address] [place], [place] [address]  CBC with Differential     Status: Abnormal   Collection Time: 12/21/22 11:06 AM  Result Value Ref Range   WBC 2.8 (L) 4.0 - 10.5 K/uL   RBC 3.30 (L) 3.87 - 5.11 MIL/uL   Hemoglobin 9.5 (L) 12.0 - 15.0 g/dL   HCT 29.1 (L) 36.0 - 46.0 %   MCV 88.2 80.0 - 100.0 fL   MCH 28.8 26.0 - 34.0 pg   MCHC 32.6 30.0 - 36.0 g/dL   RDW 14.9 11.5 - 15.5 %   Platelets 82 (L) 150 - 400 K/uL   nRBC 2.2 (H) 0.0 - 0.2 %   Neutrophils Relative % 52 %   Neutro Abs 1.5 (L) 1.7 - 7.7 K/uL   Lymphocytes Relative 11 %   Lymphs Abs 0.3 (L) 0.7 - 4.0 K/uL   Monocytes Relative 16 %   Monocytes Absolute 0.4 0.1 - 1.0 K/uL   Eosinophils Relative 0 %   Eosinophils Absolute 0.0 0.0 - 0.5 K/uL   Basophils Relative 3 %   Basophils Absolute 0.1 0.0 - 0.1 K/uL   WBC Morphology      MODERATE LEFT SHIFT (>5%  METAS AND MYELOS,OCC PRO NOTED)   RBC Morphology UNREMARKABLE    Smear Review Normal platelet morphology     Comment: PLATELETS APPEAR DECREASED   Immature Granulocytes 18 %    Comment: Increased IG's, likely caused by Bone Marrow Colony Stimulating Factor received within 30 days.   Abs Immature Granulocytes 0.49 (H) 0.00 - 0.07 K/uL    Comment: Performed at [facility], [address] [place], [place] [address]  TSH     Status: None   Collection Time: 01/04/23  8:17 AM  Result Value Ref Range   TSH 2.689 0.350 - 4.500 uIU/mL    Comment: Performed by a 3rd Generation assay with a functional sensitivity of <=0.01 uIU/mL. Performed at [facility], [address] [place], [place] [address]   T4     Status: None   Collection Time: 01/04/23  8:17 AM  Result Value Ref Range   T4, Total 6.0 4.5 - 12.0 ug/dL    Comment: (NOTE) Performed At: [facility] [place], [place] [id number] Rush Farmer MD [id number]   Comprehensive metabolic panel     Status: Abnormal   Collection Time: 01/04/23  8:17 AM  Result Value Ref Range   Sodium 139 135 - 145 mmol/L   Potassium 3.7 3.5 - 5.1 mmol/L   Chloride 108 98 - 111 mmol/L   CO2 23 22 - 32 mmol/L   Glucose, Bld 111 (H) 70 - 99 mg/dL    Comment: Glucose reference range applies only to samples taken after fasting for at least 8 hours.   BUN 15 8 - 23 mg/dL   Creatinine, Ser 1.01 (H) 0.44 - 1.00 mg/dL  Calcium 8.6 (L) 8.9 - 10.3 mg/dL   Total Protein 6.8 6.5 - 8.1 g/dL   Albumin 3.3 (L) 3.5 - 5.0 g/dL   AST 21 15 - 41 U/L   ALT 15 0 - 44 U/L   Alkaline Phosphatase 67 38 - 126 U/L   Total Bilirubin 0.3 0.3 - 1.2 mg/dL   GFR, Estimated 60 (L) >60 mL/min    Comment: (NOTE) Calculated using the CKD-EPI Creatinine Equation (2021)    Anion gap 8 5 - 15    Comment: Performed at [facility], [address] [place], [place] [address]  CBC with Differential     Status: Abnormal   Collection  Time: 01/04/23  8:17 AM  Result Value Ref Range   WBC 4.5 4.0 - 10.5 K/uL   RBC 3.00 (L) 3.87 - 5.11 MIL/uL   Hemoglobin 8.6 (L) 12.0 - 15.0 g/dL   HCT 28.2 (L) 36.0 - 46.0 %   MCV 94.0 80.0 - 100.0 fL   MCH 28.7 26.0 - 34.0 pg   MCHC 30.5 30.0 - 36.0 g/dL   RDW 17.0 (H) 11.5 - 15.5 %   Platelets 223 150 - 400 K/uL   nRBC 0.0 0.0 - 0.2 %   Neutrophils Relative % 77 %   Neutro Abs 3.4 1.7 - 7.7 K/uL   Lymphocytes Relative 12 %   Lymphs Abs 0.5 (L) 0.7 - 4.0 K/uL   Monocytes Relative 10 %   Monocytes Absolute 0.4 0.1 - 1.0 K/uL   Eosinophils Relative 0 %   Eosinophils Absolute 0.0 0.0 - 0.5 K/uL   Basophils Relative 0 %   Basophils Absolute 0.0 0.0 - 0.1 K/uL   Immature Granulocytes 1 %   Abs Immature Granulocytes 0.03 0.00 - 0.07 K/uL    Comment: Performed at [facility], [address] [place], [place] [address]  Comprehensive metabolic panel     Status: Abnormal   Collection Time: 01/25/23 10:32 AM  Result Value Ref Range   Sodium 138 135 - 145 mmol/L   Potassium 3.8 3.5 - 5.1 mmol/L   Chloride 106 98 - 111 mmol/L   CO2 23 22 - 32 mmol/L   Glucose, Bld 113 (H) 70 - 99 mg/dL    Comment: Glucose reference range applies only to samples taken after fasting for at least 8 hours.   BUN 12 8 - 23 mg/dL   Creatinine, Ser 0.89 0.44 - 1.00 mg/dL   Calcium 9.3 8.9 - 10.3 mg/dL   Total Protein 7.2 6.5 - 8.1 g/dL   Albumin 3.6 3.5 - 5.0 g/dL   AST 21 15 - 41 U/L   ALT 15 0 - 44 U/L   Alkaline Phosphatase 82 38 - 126 U/L   Total Bilirubin 0.5 0.3 - 1.2 mg/dL   GFR, Estimated >60 >60 mL/min    Comment: (NOTE) Calculated using the CKD-EPI Creatinine Equation (2021)    Anion gap 9 5 - 15    Comment: Performed at [facility], [address] [place], [place] [address]  CBC with Differential     Status: Abnormal   Collection Time: 01/25/23 10:32 AM  Result Value Ref Range   WBC 4.8 4.0 - 10.5 K/uL   RBC 3.32 (L) 3.87 - 5.11 MIL/uL   Hemoglobin 9.7 (L) 12.0 - 15.0  g/dL   HCT 31.3 (L) 36.0 - 46.0 %   MCV 94.3 80.0 - 100.0 fL   MCH 29.2 26.0 - 34.0 pg  MCHC 31.0 30.0 - 36.0 g/dL   RDW 17.2 (H) 11.5 - 15.5 %   Platelets 210 150 - 400 K/uL   nRBC 0.0 0.0 - 0.2 %   Neutrophils Relative % 64 %   Neutro Abs 3.1 1.7 - 7.7 K/uL   Lymphocytes Relative 22 %   Lymphs Abs 1.0 0.7 - 4.0 K/uL   Monocytes Relative 10 %   Monocytes Absolute 0.5 0.1 - 1.0 K/uL   Eosinophils Relative 2 %   Eosinophils Absolute 0.1 0.0 - 0.5 K/uL   Basophils Relative 1 %   Basophils Absolute 0.0 0.0 - 0.1 K/uL   Immature Granulocytes 1 %   Abs Immature Granulocytes 0.04 0.00 - 0.07 K/uL    Comment: Performed at [facility], [address] [place], [place] [address]  TSH     Status: None   Collection Time: 02/15/23  8:33 AM  Result Value Ref Range   TSH 2.044 0.350 - 4.500 uIU/mL    Comment: Performed by a 3rd Generation assay with a functional sensitivity of <=0.01 uIU/mL. Performed at [facility], [address] [place], [place] [address]   T4     Status: None   Collection Time: 02/15/23  8:33 AM  Result Value Ref Range   T4, Total 6.6 4.5 - 12.0 ug/dL    Comment: (NOTE) Performed At: [facility] [place] [id number] Rush Farmer MD [id number]   Comprehensive metabolic panel     Status: None   Collection Time: 02/15/23  8:33 AM  Result Value Ref Range   Sodium 137 135 - 145 mmol/L   Potassium 3.9 3.5 - 5.1 mmol/L   Chloride 104 98 - 111 mmol/L   CO2 24 22 - 32 mmol/L   Glucose, Bld 95 70 - 99 mg/dL    Comment: Glucose reference range applies only to samples taken after fasting for at least 8 hours.   BUN 14 8 - 23 mg/dL   Creatinine, Ser 0.80 0.44 - 1.00 mg/dL   Calcium 9.1 8.9 - 10.3 mg/dL   Total Protein 7.3 6.5 - 8.1 g/dL   Albumin 3.6 3.5 - 5.0 g/dL   AST 22 15 - 41 U/L   ALT 14 0 - 44 U/L   Alkaline Phosphatase 75 38 - 126 U/L   Total Bilirubin 0.5 0.3 - 1.2 mg/dL   GFR, Estimated >60 >60 mL/min     Comment: (NOTE) Calculated using the CKD-EPI Creatinine Equation (2021)    Anion gap 9 5 - 15    Comment: Performed at [facility], [address] [place], [place] [address]  CBC with Differential     Status: Abnormal   Collection Time: 02/15/23  8:33 AM  Result Value Ref Range   WBC 4.7 4.0 - 10.5 K/uL   RBC 3.44 (L) 3.87 - 5.11 MIL/uL   Hemoglobin 10.0 (L) 12.0 - 15.0 g/dL   HCT 32.1 (L) 36.0 - 46.0 %   MCV 93.3 80.0 - 100.0 fL   MCH 29.1 26.0 - 34.0 pg   MCHC 31.2 30.0 - 36.0 g/dL   RDW 15.9 (H) 11.5 - 15.5 %   Platelets 197 150 - 400 K/uL   nRBC 0.0 0.0 - 0.2 %   Neutrophils Relative % 61 %   Neutro Abs 2.9 1.7 - 7.7 K/uL   Lymphocytes Relative 29 %   Lymphs Abs 1.4 0.7 - 4.0 K/uL   Monocytes Relative 7 %   Monocytes  Absolute 0.3 0.1 - 1.0 K/uL   Eosinophils Relative 3 %   Eosinophils Absolute 0.1 0.0 - 0.5 K/uL   Basophils Relative 0 %   Basophils Absolute 0.0 0.0 - 0.1 K/uL   Immature Granulocytes 0 %   Abs Immature Granulocytes 0.01 0.00 - 0.07 K/uL    Comment: Performed at [facility], [address] [place], [place] [address]  Comprehensive metabolic panel     Status: Abnormal   Collection Time: 03/08/23 12:43 PM  Result Value Ref Range   Sodium 136 135 - 145 mmol/L   Potassium 4.1 3.5 - 5.1 mmol/L   Chloride 106 98 - 111 mmol/L   CO2 24 22 - 32 mmol/L   Glucose, Bld 136 (H) 70 - 99 mg/dL    Comment: Glucose reference range applies only to samples taken after fasting for at least 8 hours.   BUN 17 8 - 23 mg/dL   Creatinine, Ser 0.85 0.44 - 1.00 mg/dL   Calcium 9.1 8.9 - 10.3 mg/dL   Total Protein 7.2 6.5 - 8.1 g/dL   Albumin 3.6 3.5 - 5.0 g/dL   AST 24 15 - 41 U/L   ALT 17 0 - 44 U/L   Alkaline Phosphatase 79 38 - 126 U/L   Total Bilirubin 0.5 0.3 - 1.2 mg/dL   GFR, Estimated >60 >60 mL/min    Comment: (NOTE) Calculated using the CKD-EPI Creatinine Equation (2021)    Anion gap 6 5 - 15    Comment: Performed at [facility],  [address] [place], [place] [address]  CBC with Differential     Status: Abnormal   Collection Time: 03/08/23 12:43 PM  Result Value Ref Range   WBC 4.9 4.0 - 10.5 K/uL   RBC 3.58 (L) 3.87 - 5.11 MIL/uL   Hemoglobin 10.5 (L) 12.0 - 15.0 g/dL   HCT 33.2 (L) 36.0 - 46.0 %   MCV 92.7 80.0 - 100.0 fL   MCH 29.3 26.0 - 34.0 pg   MCHC 31.6 30.0 - 36.0 g/dL   RDW 14.6 11.5 - 15.5 %   Platelets 192 150 - 400 K/uL   nRBC 0.0 0.0 - 0.2 %   Neutrophils Relative % 68 %   Neutro Abs 3.3 1.7 - 7.7 K/uL   Lymphocytes Relative 24 %   Lymphs Abs 1.2 0.7 - 4.0 K/uL   Monocytes Relative 7 %   Monocytes Absolute 0.4 0.1 - 1.0 K/uL   Eosinophils Relative 1 %   Eosinophils Absolute 0.1 0.0 - 0.5 K/uL   Basophils Relative 0 %   Basophils Absolute 0.0 0.0 - 0.1 K/uL   Immature Granulocytes 0 %   Abs Immature Granulocytes 0.02 0.00 - 0.07 K/uL    Comment: Performed at [facility], [address] [place], [place] [address]    Diabetic Foot Exam: Diabetic Foot Exam - Simple   No data filed    ***  PHQ2/9:    08/27/2022    3:35 PM 08/05/2022   11:37 AM 01/11/2022    3:05 PM 12/31/2021    9:16 AM 12/24/2021   11:43 AM  Depression screen PHQ 2/9  Decreased Interest 0 0 0 0 0  Down, Depressed, Hopeless 0 0 0 0 0  PHQ - 2 Score 0 0 0 0 0  Altered sleeping 0 0  0   Tired, decreased energy 1 3  0   Change in appetite 0 0  0   Feeling bad or  failure about yourself  0 0  0   Trouble concentrating 0 0  0   Moving slowly or fidgety/restless 0 0  0   Suicidal thoughts 0 0  0   PHQ-9 Score 1 3  0     phq 9 is {gen pos [id number] ***  Fall Risk:    08/27/2022    3:35 PM 08/05/2022   11:36 AM 01/11/2022    3:05 PM 12/31/2021    9:16 AM 12/24/2021   11:45 AM  Fall Risk   Falls in the past year? 0 0 0 0 0  Number falls in past yr:  0 0 0 0  Injury with Fall?  0 0 0 0  Risk for fall due to : No Fall Risks No Fall Risks  No Fall Risks No Fall Risks  Follow up Falls prevention  discussed;Education provided;Falls evaluation completed Falls prevention discussed Falls evaluation completed Falls prevention discussed Falls prevention discussed   ***   Functional Status Survey:   ***   Assessment & Plan  *** There are no diagnoses linked to this encounter.

## 2023-03-14 ENCOUNTER — Telehealth: Payer: Self-pay | Admitting: Family Medicine

## 2023-03-14 ENCOUNTER — Ambulatory Visit: Payer: Medicare Other | Admitting: Family Medicine

## 2023-03-14 DIAGNOSIS — E1169 Type 2 diabetes mellitus with other specified complication: Secondary | ICD-10-CM

## 2023-03-14 NOTE — Telephone Encounter (Signed)
Contacted Morgan Peterson to schedule their annual wellness visit. Appointment made for 04/07/2023.  [place] Direct Dial: [phone]

## 2023-03-15 DIAGNOSIS — R001 Bradycardia, unspecified: Secondary | ICD-10-CM | POA: Diagnosis not present

## 2023-03-15 DIAGNOSIS — R002 Palpitations: Secondary | ICD-10-CM | POA: Diagnosis not present

## 2023-03-15 DIAGNOSIS — I10 Essential (primary) hypertension: Secondary | ICD-10-CM | POA: Diagnosis not present

## 2023-03-15 DIAGNOSIS — R5382 Chronic fatigue, unspecified: Secondary | ICD-10-CM | POA: Diagnosis not present

## 2023-03-15 DIAGNOSIS — E119 Type 2 diabetes mellitus without complications: Secondary | ICD-10-CM | POA: Diagnosis not present

## 2023-03-15 DIAGNOSIS — Z9221 Personal history of antineoplastic chemotherapy: Secondary | ICD-10-CM | POA: Diagnosis not present

## 2023-03-15 DIAGNOSIS — E782 Mixed hyperlipidemia: Secondary | ICD-10-CM | POA: Diagnosis not present

## 2023-03-15 DIAGNOSIS — I341 Nonrheumatic mitral (valve) prolapse: Secondary | ICD-10-CM | POA: Diagnosis not present

## 2023-03-22 ENCOUNTER — Ambulatory Visit
Admission: RE | Admit: 2023-03-22 | Discharge: 2023-03-22 | Disposition: A | Discharge status: Home / Self Care | Payer: Medicare Other | Source: Ambulatory Visit | Attending: Surgery | Admitting: Surgery

## 2023-03-22 ENCOUNTER — Other Ambulatory Visit: Payer: Self-pay

## 2023-03-22 DIAGNOSIS — Z1231 Encounter for screening mammogram for malignant neoplasm of breast: Secondary | ICD-10-CM | POA: Insufficient documentation

## 2023-03-24 ENCOUNTER — Other Ambulatory Visit: Payer: Self-pay | Admitting: Surgery

## 2023-03-24 DIAGNOSIS — R928 Other abnormal and inconclusive findings on diagnostic imaging of breast: Secondary | ICD-10-CM

## 2023-03-24 DIAGNOSIS — R921 Mammographic calcification found on diagnostic imaging of breast: Secondary | ICD-10-CM

## 2023-03-25 ENCOUNTER — Ambulatory Visit
Admission: RE | Admit: 2023-03-25 | Discharge: 2023-03-25 | Disposition: A | Discharge status: Home / Self Care | Payer: Medicare Other | Source: Ambulatory Visit | Attending: Surgery | Admitting: Surgery

## 2023-03-25 DIAGNOSIS — R921 Mammographic calcification found on diagnostic imaging of breast: Secondary | ICD-10-CM | POA: Diagnosis not present

## 2023-03-25 DIAGNOSIS — R928 Other abnormal and inconclusive findings on diagnostic imaging of breast: Secondary | ICD-10-CM | POA: Insufficient documentation

## 2023-03-25 DIAGNOSIS — R922 Inconclusive mammogram: Secondary | ICD-10-CM | POA: Diagnosis not present

## 2023-03-28 ENCOUNTER — Other Ambulatory Visit: Payer: Self-pay | Admitting: Surgery

## 2023-03-28 DIAGNOSIS — R921 Mammographic calcification found on diagnostic imaging of breast: Secondary | ICD-10-CM

## 2023-03-28 DIAGNOSIS — R928 Other abnormal and inconclusive findings on diagnostic imaging of breast: Secondary | ICD-10-CM

## 2023-03-29 ENCOUNTER — Inpatient Hospital Stay: Payer: Medicare Other

## 2023-03-29 ENCOUNTER — Encounter: Payer: Self-pay | Admitting: Internal Medicine

## 2023-03-29 ENCOUNTER — Inpatient Hospital Stay: Payer: Medicare Other | Attending: Oncology | Admitting: Internal Medicine

## 2023-03-29 VITALS — BP 103/66 | HR 66 | Temp 97.5°F | Resp 17

## 2023-03-29 DIAGNOSIS — C50411 Malignant neoplasm of upper-outer quadrant of right female breast: Secondary | ICD-10-CM

## 2023-03-29 DIAGNOSIS — Z923 Personal history of irradiation: Secondary | ICD-10-CM | POA: Insufficient documentation

## 2023-03-29 DIAGNOSIS — Z171 Estrogen receptor negative status [ER-]: Secondary | ICD-10-CM | POA: Diagnosis not present

## 2023-03-29 DIAGNOSIS — Z7982 Long term (current) use of aspirin: Secondary | ICD-10-CM | POA: Diagnosis not present

## 2023-03-29 DIAGNOSIS — I10 Essential (primary) hypertension: Secondary | ICD-10-CM | POA: Insufficient documentation

## 2023-03-29 DIAGNOSIS — Z5112 Encounter for antineoplastic immunotherapy: Secondary | ICD-10-CM | POA: Insufficient documentation

## 2023-03-29 DIAGNOSIS — Z9011 Acquired absence of right breast and nipple: Secondary | ICD-10-CM | POA: Insufficient documentation

## 2023-03-29 DIAGNOSIS — C50811 Malignant neoplasm of overlapping sites of right female breast: Secondary | ICD-10-CM | POA: Diagnosis not present

## 2023-03-29 DIAGNOSIS — Z87891 Personal history of nicotine dependence: Secondary | ICD-10-CM | POA: Diagnosis not present

## 2023-03-29 DIAGNOSIS — Z79899 Other long term (current) drug therapy: Secondary | ICD-10-CM | POA: Insufficient documentation

## 2023-03-29 LAB — CBC WITH DIFFERENTIAL/PLATELET
Abs Immature Granulocytes: 0.01 10*3/uL (ref 0.00–0.07)
Basophils Absolute: 0 10*3/uL (ref 0.0–0.1)
Basophils Relative: 1 %
Eosinophils Absolute: 0.1 10*3/uL (ref 0.0–0.5)
Eosinophils Relative: 2 %
HCT: 33.1 % — ABNORMAL LOW (ref 36.0–46.0)
Hemoglobin: 10.5 g/dL — ABNORMAL LOW (ref 12.0–15.0)
Immature Granulocytes: 0 %
Lymphocytes Relative: 30 %
Lymphs Abs: 1.2 10*3/uL (ref 0.7–4.0)
MCH: 29.7 pg (ref 26.0–34.0)
MCHC: 31.7 g/dL (ref 30.0–36.0)
MCV: 93.5 fL (ref 80.0–100.0)
Monocytes Absolute: 0.3 10*3/uL (ref 0.1–1.0)
Monocytes Relative: 7 %
Neutro Abs: 2.3 10*3/uL (ref 1.7–7.7)
Neutrophils Relative %: 60 %
Platelets: 186 10*3/uL (ref 150–400)
RBC: 3.54 MIL/uL — ABNORMAL LOW (ref 3.87–5.11)
RDW: 14 % (ref 11.5–15.5)
WBC: 3.9 10*3/uL — ABNORMAL LOW (ref 4.0–10.5)
nRBC: 0 % (ref 0.0–0.2)

## 2023-03-29 LAB — COMPREHENSIVE METABOLIC PANEL
ALT: 17 U/L (ref 0–44)
AST: 20 U/L (ref 15–41)
Albumin: 3.6 g/dL (ref 3.5–5.0)
Alkaline Phosphatase: 79 U/L (ref 38–126)
Anion gap: 7 (ref 5–15)
BUN: 15 mg/dL (ref 8–23)
CO2: 24 mmol/L (ref 22–32)
Calcium: 9.1 mg/dL (ref 8.9–10.3)
Chloride: 106 mmol/L (ref 98–111)
Creatinine, Ser: 0.95 mg/dL (ref 0.44–1.00)
GFR, Estimated: >60 mL/min (ref >60)
Glucose, Bld: 100 mg/dL — ABNORMAL HIGH (ref 70–99)
Potassium: 4.1 mmol/L (ref 3.5–5.1)
Sodium: 137 mmol/L (ref 135–145)
Total Bilirubin: 0.6 mg/dL (ref 0.3–1.2)
Total Protein: 7.2 g/dL (ref 6.5–8.1)

## 2023-03-29 MED ORDER — SODIUM CHLORIDE 0.9 % IV SOLN
200.0000 mg | Freq: Once | INTRAVENOUS | Status: AC
  Administered 2023-03-29: 200 mg via INTRAVENOUS
  Filled 2023-03-29: qty 8

## 2023-03-29 MED ORDER — HEPARIN SOD (PORK) LOCK FLUSH 100 UNIT/ML IV SOLN
500.0000 [IU] | Freq: Once | INTRAVENOUS | Status: AC | PRN
  Administered 2023-03-29: 500 [IU]
  Filled 2023-03-29: qty 5

## 2023-03-29 MED ORDER — SODIUM CHLORIDE 0.9 % IV SOLN
Freq: Once | INTRAVENOUS | Status: AC
  Filled 2023-03-29: qty 250

## 2023-03-29 NOTE — Progress Notes (Signed)
[facility] CONSULT NOTE  Patient Care Team: Alba Cory, MD as PCP - General (Family Medicine) Lemar Livings, Merrily Pew, MD (General Surgery) Bud Face, MD as Referring Physician (Otolaryngology) Alwyn Pea, MD as Consulting Physician (Cardiology) Jesusita Oka, MD as Referring Physician (Dermatology) Sherlon Handing, MD as Consulting Physician (Internal Medicine) Earna Coder, MD as Consulting Physician (Internal Medicine)  CHIEF COMPLAINTS/PURPOSE OF CONSULTATION: Breast cancer  #  Oncology History Overview Note  She has a remote history of right breast cancer, T1bN0, ER positive,s/p lumpectomy and MammoSite radiation and 5 years of Arimidex.    02/19/22 screening mammogram bilaterally showed  1. Suspicious right breast mass at the 10 o'clock position 8 cm from the nipple. Recommend ultrasound-guided biopsy. 2. Indeterminate right breast mass at the 6 o'clock position 4 cm from the nipple. Recommend ultrasound-guided biopsy. 3. No suspicious right axillary lymphadenopathy.   04/12/2022 Unilateral right breast diagnostic mammogram showed 1. Suspicious right breast mass at the 10 o'clock position 8 cm from the nipple. Recommend ultrasound-guided biopsy. 2. Indeterminate right breast mass at the 6 o'clock position 4 cm from the nipple. Recommend ultrasound-guided biopsy. 3. No suspicious right axillary lymphadenopathy.   04/13/2022 right breast mass 6 o'clock position 1 cm from the nipple showed invasive mammary carcinoma, no special type. Grade 3, DCIS present high grade, LVI not identified. ER-, PR 1-10%, HER 2 -   04/13/2022, right breast mass 10:00 7 cm from nipple biopsy showed invasive mammary carcinoma, grade 3, high-grade DCIS with focal comedonecrosis, lymphovascular invasion not identified.  ER -, PR 1 to 10%, HER2-    04/26/2022, right breast mastectomy with axillary dissection Invasive mammary carcinoma, no special type, multifocal, DCIS  high-grade, benign nipple/areola.  2 deposits of invasive mammary carcinoma, no definite residual lymph node identified.   Breast cancer  05/10/2022 Initial Diagnosis   Breast cancer (HCC)   05/10/2022 Cancer Staging   Staging form: Breast, AJCC 8th Edition - Pathologic stage from 05/10/2022: Stage IIA (pT1c, pN1a, cM0, G3, ER-, PR+, HER2-) - Signed by Rickard Patience, MD on 05/10/2022 Stage prefix: Initial diagnosis Histologic grading system: 3 grade system   05/24/2022 - 05/24/2022 Chemotherapy   Patient is on Treatment Plan : BREAST Pembrolizumab (200) D1 + Carboplatin (5) D1 + Paclitaxel (80) D1,8,15 q21d X 4 cycles / Pembrolizumab (200) D1 + AC D1 q21d x 4 cycles     05/27/2022 - 08/13/2022 Chemotherapy   Patient is on Treatment Plan : BREAST Pembrolizumab (200) D1 + Carboplatin (5) D1 + Paclitaxel (80) D1,8,15 q21d X 4 cycles / Pembrolizumab (200) D1 + AC D1 q21d x 4 cycles     05/27/2022 - 12/07/2022 Chemotherapy   Patient is on Treatment Plan : BREAST Pembrolizumab (200) D1 + Carboplatin (5) D1 + Paclitaxel (80) D1,8,15 q21d X 4 cycles / Pembrolizumab (200) D1 + AC D1 q21d x 4 cycles     01/04/2023 -  Chemotherapy   Patient is on Treatment Plan : BREAST Pembrolizumab (200) q21d x 27 weeks     Carcinoma of upper-outer quadrant of right breast in female, estrogen receptor negative  05/26/2022 Initial Diagnosis   Carcinoma of upper-outer quadrant of right breast in female, estrogen receptor negative (HCC)   05/26/2022 Cancer Staging   Staging form: Breast, AJCC 8th Edition - Pathologic: Stage IIA (pT1c, pN1, cM0, G3, ER-, PR-, HER2-) - Signed by Earna Coder, MD on 05/26/2022 Multigene prognostic tests performed: None Histologic grading system: 3 grade system   05/27/2022 -  12/07/2022 Chemotherapy   Patient is on Treatment Plan : BREAST Pembrolizumab (200) D1 + Carboplatin (5) D1 + Paclitaxel (80) D1,8,15 q21d X 4 cycles / Pembrolizumab (200) D1 + AC D1 q21d x 4 cycles     01/04/2023 -   Chemotherapy   Patient is on Treatment Plan : BREAST Pembrolizumab (200) q21d x 27 weeks      HISTORY OF PRESENTING ILLNESS: Ambulating independently.  Alone.   Morgan Peterson 72 y.o.  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 Appetite is better. Had her mammogram. Taking stool softeners for constipation. Pt on a screening mammogram noted to have calcifications in her breast.   Neuropathy in hands is getting slightly improved.  Still doing acupuncture with little relief.   Denies any nausea . No fever no chills.   Review of Systems  Constitutional:  Positive for malaise/fatigue. Negative for chills, diaphoresis, fever and weight loss.  HENT:  Negative for nosebleeds and sore throat.   Eyes:  Negative for double vision.  Respiratory:  Negative for cough, hemoptysis, sputum production, shortness of breath and wheezing.   Cardiovascular:  Negative for chest pain, palpitations, orthopnea and leg swelling.  Gastrointestinal:  Negative for abdominal pain, blood in stool, constipation, diarrhea, heartburn, melena, nausea and vomiting.  Genitourinary:  Negative for dysuria, frequency and urgency.  Musculoskeletal:  Negative for back pain and joint pain.  Skin: Negative.  Negative for itching and rash.  Neurological:  Positive for tingling. Negative for dizziness, focal weakness, weakness and headaches.  Endo/Heme/Allergies:  Does not bruise/bleed easily.  Psychiatric/Behavioral:  Negative for depression. The patient is not nervous/anxious and does not have insomnia.      MEDICAL HISTORY:  Past Medical History:  Diagnosis Date   Appetite loss    Asthma    Cervical radiculopathy    Chemotherapy-induced nausea    Hiatal hernia    Hypercholesterolemia    Hyperlipidemia    Hypertension    IBS (irritable bowel syndrome)     Malignant neoplasm of upper-outer quadrant of female breast 04/11/2009   Right breast, invasive ductal carcinoma, 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 Arimidex therapy completed September 2015   Mild mitral valve prolapse    Neuropathy    feet, from chemo   Personal history of malignant neoplasm of breast 2010   Personal history of radiation therapy 2010   mammosite   T2DM (type 2 diabetes mellitus) 2011    SURGICAL HISTORY: Past Surgical History:  Procedure Laterality Date   ABDOMINAL EXPLORATION SURGERY  2000   ovarian cyst   BREAST BIOPSY Right 2010   +    BREAST EXCISIONAL BIOPSY Right 2010   + mammo site inasive mammo ca   BREAST LUM[facility]OMY Right 2010   [facility]   COLONOSCOPY  07/19/2012   Normal exam, Dr. Lemar Livings   COLONOSCOPY WITH PROPOFOL N/A 04/21/2022   Procedure: COLONOSCOPY WITH PROPOFOL;  Surgeon: Earline Mayotte, MD;  Location: [facility];  Service: Endoscopy;  Laterality: N/A;   ESOPHAGOGASTRODUODENOSCOPY (EGD) WITH PROPOFOL N/A 10/22/2022   Procedure: ESOPHAGOGASTRODUODENOSCOPY (EGD) WITH PROPOFOL;  Surgeon: Midge Minium, MD;  Location: [facility];  Service: Endoscopy;  Laterality: N/A;  Diabetic   MASTECTOMY Right 2010   partial/ lumpectomy   PORTACATH PLACEMENT Left 05/19/2022   Procedure: INSERTION PORT-A-CATH;  Surgeon: Earline Mayotte, MD;  Location: [facility];  Service: General;  Laterality: Left;   SIMPLE MASTECTOMY WITH AXILLARY SENTINEL NODE BIOPSY Right 04/26/2022   Procedure: SIMPLE MASTECTOMY WITH AXILLARY SENTINEL NODE BIOPSY;  Surgeon: Earline Mayotte, MD;  Location: [facility];  Service: General;  Laterality: Right;  RNFA to assist   TUBAL LIGATION  20 years ago    SOCIAL HISTORY: Social History   Socioeconomic History   Marital status: Married    Spouse name: Antonio   Number of children: 2   Years of education: Not on file   Highest education level: Not on file   Occupational History   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  Tobacco Use   Smoking status: Former    Packs/day: 0.25    Years: 10.00    Additional pack years: 0.00    Total pack years: 2.50    Types: Cigarettes    Start date: 1978    Quit date: 1988    Years since quitting: 36.2   Smokeless tobacco: Never   Tobacco comments:    smoking cessation materials not required  Vaping Use   Vaping Use: Never used  Substance and Sexual Activity   Alcohol use: Yes    Comment: wine occ   Drug use: No   Sexual activity: Not Currently    Comment: husband has ED  Other Topics Concern   Not on file  Social History Narrative   Oncology nurse on the floor retired.;  Husband retired from [employer].  No smoking.   Social Determinants of Health   Financial Resource Strain: Low Risk  (12/31/2021)   Overall Financial Resource Strain (CARDIA)    Difficulty of Paying Living Expenses: Not hard at all  Food Insecurity: No Food Insecurity (12/31/2021)   Hunger Vital Sign    Worried About Running Out of Food in the Last Year: Never true    Ran Out of Food in the Last Year: Never true  Transportation Needs: No Transportation Needs (12/31/2021)   PRAPARE - Administrator, Civil Service (Medical): No    Lack of Transportation (Non-Medical): No  Physical Activity: Inactive (12/31/2021)   Exercise Vital Sign    Days of Exercise per Week: 0 days    Minutes of Exercise per Session: 0 min  Stress: No Stress Concern Present (12/31/2021)   [employer] of Occupational Health - Occupational Stress Questionnaire    Feeling of Stress : Only a little  Social Connections: Moderately Integrated (12/31/2021)   Social Connection and Isolation Panel [NHANES]    Frequency of Communication with Friends and Family: More than three times a week    Frequency of Social Gatherings with Friends and Family: Three times a week    Attends Religious Services: More than 4 times per year    Active  Member of Clubs or Organizations: No    Attends Banker Meetings: Never    Marital Status: Married  Catering manager Violence: Not At Risk (12/31/2021)   Humiliation, Afraid, Rape, and Kick questionnaire    Fear of Current or Ex-Partner: No    Emotionally Abused: No    Physically Abused: No    Sexually Abused: No    FAMILY HISTORY: Family History  Problem Relation Age of Onset   Osteoporosis Mother    Diabetes Father    Kidney disease Father    Diabetes Brother    Breast cancer Maternal Aunt    Bladder Cancer Maternal  Aunt    Cervical cancer Maternal Aunt    Colon cancer Maternal Uncle    Lung cancer Maternal Uncle     ALLERGIES:  is allergic to levaquin [levofloxacin in d5w], losartan, metoprolol, saxagliptin, amlodipine-olmesartan, canagliflozin, egg white [egg white (egg protein)], gabapentin, glipizide, gluten meal, lisinopril, metformin and related, milk (cow), milk-related compounds, shrimp extract, tramadol, zyprexa [olanzapine], actos [pioglitazone], atorvastatin, carvedilol, and prednisone.  MEDICATIONS:  Current Outpatient Medications  Medication Sig Dispense Refill   aspirin EC 81 MG tablet Take 1 tablet (81 mg total) by mouth daily. 30 tablet 0   atenolol (TENORMIN) 25 MG tablet TAKE 1 TABLET (25 MG TOTAL) BY MOUTH DAILY. 90 tablet 0   betamethasone, augmented, (DIPROLENE) 0.05 % lotion Apply 1 application  topically 2 (two) times daily.     Blood Glucose Monitoring Suppl (ONETOUCH VERIO) w/Device KIT 1 Device by Does not apply route once. 1 kit 0   cholecalciferol (VITAMIN D) 1000 UNITS tablet Take 1,000 Units by mouth daily.     Cyanocobalamin (VITAMIN B-12 PO) Take 1 tablet by mouth daily as needed (fatigue).     EPINEPHrine (EPIPEN 2-PAK) 0.3 mg/0.3 mL IJ SOAJ injection Inject 0.3 mg into the muscle as needed for anaphylaxis. 2 each 0   famotidine (PEPCID) 20 MG tablet Take 20 mg by mouth 2 (two) times daily.     glucose blood (ONETOUCH VERIO) test  strip Use as instructed 100 each 12   Lancets (ONETOUCH ULTRASOFT) lancets Use as instructed 100 each 3   levocetirizine (XYZAL) 5 MG tablet Take 1 tablet (5 mg total) by mouth every evening. 90 tablet 1   lidocaine-prilocaine (EMLA) cream Apply 1 application. topically as needed. Apply to port and cover with saran wrap 1-2 hours prior to port access 30 g 1   montelukast (SINGULAIR) 10 MG tablet TAKE 1 TABLET BY MOUTH EVERYDAY AT BEDTIME 90 tablet 1   Multiple Vitamin (MULTIVITAMIN WITH MINERALS) TABS tablet Take 1 tablet by mouth 2 (two) times a week.     ondansetron (ZOFRAN) 8 MG tablet Take 1 tablet (8 mg total) by mouth every 8 (eight) hours as needed for nausea or vomiting. 40 tablet 1   OZEMPIC, 0.25 OR 0.5 MG/DOSE, 2 MG/1.5ML SOPN Inject 0.5 mg into the skin every Sunday.     rosuvastatin (CRESTOR) 10 MG tablet TAKE 1 TABLET (10 MG TOTAL) BY MOUTH EVERY MORNING. 90 tablet 1   spironolactone (ALDACTONE) 50 MG tablet TAKE 1 TABLET BY MOUTH EVERY DAY 90 tablet 0   promethazine (PHENERGAN) 25 MG tablet Take 1 tablet (25 mg total) by mouth every 6 (six) hours as needed for nausea or vomiting. (Patient not taking: Reported on 12/21/2022) 40 tablet 0   No current facility-administered medications for this visit.   Facility-Administered Medications Ordered in Other Visits  Medication Dose Route Frequency Provider Last Rate Last Admin   heparin lock flush 100 unit/mL  500 Units Intravenous Once Earna Coder, MD          .  PHYSICAL EXAMINATION: ECOG PERFORMANCE STATUS: 0 - Asymptomatic  There were no vitals filed for this visit.    There were no vitals filed for this visit.     Physical Exam Vitals and nursing note reviewed.  HENT:     Head: Normocephalic and atraumatic.     Mouth/Throat:     Pharynx: Oropharynx is clear.  Eyes:     Extraocular Movements: Extraocular movements intact.     Pupils: Pupils are  equal, round, and reactive to light.  Cardiovascular:      Rate and Rhythm: Normal rate and regular rhythm.  Pulmonary:     Comments: Decreased breath sounds bilaterally.  Abdominal:     Palpations: Abdomen is soft.  Musculoskeletal:        General: Normal range of motion.     Cervical back: Normal range of motion.  Skin:    General: Skin is warm.  Neurological:     General: No focal deficit present.     Mental Status: She is alert and oriented to person, place, and time.  Psychiatric:        Behavior: Behavior normal.        Judgment: Judgment normal.      LABORATORY DATA:  I have reviewed the data as listed Lab Results  Component Value Date   WBC 3.9 (L) 03/29/2023   HGB 10.5 (L) 03/29/2023   HCT 33.1 (L) 03/29/2023   MCV 93.5 03/29/2023   PLT 186 03/29/2023   Recent Labs    02/15/23 0833 03/08/23 1243 03/29/23 0822  NA 137 136 137  K 3.9 4.1 4.1  CL 104 106 106  CO2 24 24 24   GLUCOSE 95 136* 100*  BUN 14 17 15   CREATININE 0.80 0.85 0.95  CALCIUM 9.1 9.1 9.1  GFRNONAA >60 >60 >60  PROT 7.3 7.2 7.2  ALBUMIN 3.6 3.6 3.6  AST 22 24 20   ALT 14 17 17   ALKPHOS 75 79 79  BILITOT 0.5 0.5 0.6    RADIOGRAPHIC STUDIES: I have personally reviewed the radiological images as listed and agreed with the findings in the report. MM 3D DIAGNOSTIC MAMMOGRAM UNILATERAL LEFT BREAST  Result Date: 03/25/2023 CLINICAL DATA:  Screening recall for possible left breast calcifications. Of note, she is status post right mastectomy in 2023 for invasive ductal carcinoma. EXAM: DIGITAL DIAGNOSTIC UNILATERAL LEFT MAMMOGRAM WITH TOMOSYNTHESIS TECHNIQUE: Left digital diagnostic mammography and breast tomosynthesis was performed. COMPARISON:  Previous exam(s). ACR Breast Density Category b: There are scattered areas of fibroglandular density. FINDINGS: Spot magnification views demonstrate a 10 mm group of coarse heterogeneous calcification in the upper-outer left breast posterior depth. This corresponds with the screening mammogram finding and is not  definitely stable compared to prior exams. Incidentally visualized additional 10 mm group of coarse heterogeneous calcification in the upper central left breast middle depth. This is also not definitely stable compared to prior exams. No additional suspicious mass, calcification, or other findings are identified in the left breast. IMPRESSION: There are two separate 10 mm groups of coarse heterogeneous calcification in the left breast that are low suspicion for malignancy. Recommend further assessment with stereotactic guided biopsy. RECOMMENDATION: Left breast stereotactic guided biopsy (2 site). I have discussed the findings and recommendations with the patient. The biopsy procedure was discussed with the patient and questions were answered. Patient expressed their understanding of the biopsy recommendation. Patient will be scheduled for biopsy at her earliest convenience by the schedulers. Ordering provider will be notified. If applicable, a reminder letter will be sent to the patient regarding the next appointment. BI-RADS CATEGORY  4: Suspicious. Electronically Signed   By: Jacob Peterson M.D.   On: 03/25/2023 17:02  MM 3D SCREEN BREAST UNI LEFT  Result Date: 03/23/2023 CLINICAL DATA:  Screening. Interval right mastectomy. EXAM: DIGITAL SCREENING UNILATERAL LEFT MAMMOGRAM WITH CAD AND TOMOSYNTHESIS TECHNIQUE: Left screening digital craniocaudal and mediolateral oblique mammograms were obtained. Left screening digital breast tomosynthesis was performed. The images were evaluated  with computer-aided detection. COMPARISON:  Previous exam(s). ACR Breast Density Category b: There are scattered areas of fibroglandular density. FINDINGS: In the left breast, calcifications warrant further evaluation. In the right breast, no findings suspicious for malignancy. IMPRESSION: Further evaluation is suggested for calcifications in the left breast. RECOMMENDATION: Diagnostic mammogram of the left breast. (Code:[id number])  The patient will be contacted regarding the findings, and additional imaging will be scheduled. BI-RADS CATEGORY  0: Incomplete: Need additional imaging evaluation. Electronically Signed   By: Edwin CapJennifer  Jarosz M.D.   On: 03/23/2023 12:16    ASSESSMENT & PLAN:   Carcinoma of upper-outer quadrant of right breast in female, estrogen receptor negative (HCC) # Stage II triple negative breast cancer [mT1cN1; Grade 3 ]- s/p mastectomy.  Patient currently on adjuvant chemoimmunotherapy [KEYNOTE 522 study]; patient s/p carboplatin weekly Taxol.  Currently on Adriamycin-Cytoxan Keytruda-s/p 4 cycles [cycle #4 reduced to Adriamycin to 40 mg/m2].   # proceed with adjuvant Keytruda #6  out of planned 9 cycles. Labs today reviewed;  acceptable for treatment today. FEB 2024- TSH- WNL.   #left breast- mammogram-  There are two separate 10 mm groups of coarse heterogeneous calcification in the left breast that are low suspicion for malignancy. Awaiting stereotactic guided biopsy.  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    # Hypomagnesemia magnesium NOV 2023- 1.7 continue mag oxide to twice a day.  vit D Sep 40. Continue ca+vit D.Stable.   # DM- on  ozempic.  Monitor blood sugars closely.Stable.  # Hypokalemia- improved; ok to stop Kdur. Stable.  # DISPOSITION: # keytruda today # follow up in 3 weeks- MD; port-labs- cbc/cmp; Keytruda- Dr.B  All questions were answered. The patient/family knows to call the clinic with any problems, questions or concerns.    Earna CoderGovinda R Jessilynn Taft, MD 03/29/2023 9:29 AM

## 2023-03-29 NOTE — Assessment & Plan Note (Signed)
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  Patient currently on adjuvant chemoimmunotherapy [KEYNOTE 522 study]; patient s/p carboplatin weekly Taxol.  Currently on Adriamycin-Cytoxan Keytruda-s/p 4 cycles [cycle #4 reduced to Adriamycin to 40 mg/m2].   # proceed with adjuvant Keytruda #6  out of planned 9 cycles. Labs today reviewed;  acceptable for treatment today. FEB 2024- TSH- WNL.   #left breast- mammogram-  There are two separate 10 mm groups of coarse heterogeneous calcification in the left breast that are low suspicion for malignancy. Awaiting stereotactic guided biopsy.  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    # Hypomagnesemia magnesium NOV 2023- 1.7 continue mag oxide to twice a day.  vit D Sep 40. Continue ca+vit D.Stable.   # DM- on  ozempic.  Monitor blood sugars closely.Stable.  # Hypokalemia- improved; ok to stop Kdur. Stable.  # DISPOSITION: # keytruda today # follow up in 3 weeks- MD; port-labs- cbc/cmp; Keytruda- Dr.B

## 2023-03-29 NOTE — Progress Notes (Signed)
Still has neuropathy. Appetite is better. Had her mammogram. Taking stool softeners for constipation. Pt wants to ask Dr B about calcifications in her breast, should she have a biopsy.

## 2023-03-30 ENCOUNTER — Other Ambulatory Visit: Payer: Self-pay

## 2023-03-30 DIAGNOSIS — I341 Nonrheumatic mitral (valve) prolapse: Secondary | ICD-10-CM | POA: Diagnosis not present

## 2023-03-30 DIAGNOSIS — Z9221 Personal history of antineoplastic chemotherapy: Secondary | ICD-10-CM | POA: Diagnosis not present

## 2023-03-31 ENCOUNTER — Other Ambulatory Visit: Payer: Self-pay | Admitting: Family Medicine

## 2023-03-31 DIAGNOSIS — L509 Urticaria, unspecified: Secondary | ICD-10-CM

## 2023-04-01 ENCOUNTER — Other Ambulatory Visit: Payer: Self-pay

## 2023-04-04 DIAGNOSIS — I890 Lymphedema, not elsewhere classified: Secondary | ICD-10-CM | POA: Diagnosis not present

## 2023-04-04 DIAGNOSIS — C50411 Malignant neoplasm of upper-outer quadrant of right female breast: Secondary | ICD-10-CM | POA: Diagnosis not present

## 2023-04-05 ENCOUNTER — Other Ambulatory Visit: Payer: Self-pay

## 2023-04-06 ENCOUNTER — Ambulatory Visit
Admission: RE | Admit: 2023-04-06 | Discharge: 2023-04-06 | Disposition: A | Discharge status: Home / Self Care | Payer: Medicare Other | Source: Ambulatory Visit | Attending: Surgery | Admitting: Surgery

## 2023-04-06 DIAGNOSIS — R928 Other abnormal and inconclusive findings on diagnostic imaging of breast: Secondary | ICD-10-CM

## 2023-04-06 DIAGNOSIS — R921 Mammographic calcification found on diagnostic imaging of breast: Secondary | ICD-10-CM

## 2023-04-06 DIAGNOSIS — N6022 Fibroadenosis of left breast: Secondary | ICD-10-CM | POA: Diagnosis not present

## 2023-04-06 HISTORY — PX: BREAST BIOPSY: SHX20

## 2023-04-06 MED ORDER — LIDOCAINE HCL (PF) 1 % IJ SOLN
15.0000 mL | Freq: Once | INTRAMUSCULAR | Status: AC
  Administered 2023-04-06: 15 mL

## 2023-04-06 MED ORDER — LIDOCAINE-EPINEPHRINE 1 %-1:100000 IJ SOLN
10.0000 mL | Freq: Once | INTRAMUSCULAR | Status: AC
  Administered 2023-04-06: 10 mL

## 2023-04-07 ENCOUNTER — Telehealth: Payer: Self-pay

## 2023-04-07 ENCOUNTER — Ambulatory Visit (INDEPENDENT_AMBULATORY_CARE_PROVIDER_SITE_OTHER): Payer: Medicare Other

## 2023-04-07 VITALS — Ht 66.0 in | Wt 172.0 lb

## 2023-04-07 DIAGNOSIS — Z0000 Encounter for general adult medical examination without abnormal findings: Secondary | ICD-10-CM

## 2023-04-07 LAB — SURGICAL PATHOLOGY

## 2023-04-07 NOTE — Patient Instructions (Signed)
Morgan Peterson , Thank you for taking time to come for your Medicare Wellness Visit. I appreciate your ongoing commitment to your health goals. Please review the following plan we discussed and let me know if I can assist you in the future.   These are the goals we discussed:  Goals      Weight (lb) < 200 lb (90.7 kg)     Pt would like to lose weight over the next year with healthy eating and physical activity         This is a list of the screening recommended for you and due dates:  Health Maintenance  Topic Date Due   Zoster (Shingles) Vaccine (1 of 2) Never done   Yearly kidney health urinalysis for diabetes  01/31/2020   Eye exam for diabetics  06/05/2020   Complete foot exam   01/20/2022   Hemoglobin A1C  08/03/2022   COVID-19 Vaccine (4 - 2023-24 season) 08/20/2022   Flu Shot  07/21/2023   Yearly kidney function blood test for diabetes  03/28/2024   Medicare Annual Wellness Visit  04/06/2024   Mammogram  03/21/2025   DTaP/Tdap/Td vaccine (3 - Td or Tdap) 01/20/2031   Colon Cancer Screening  04/21/2032   Pneumonia Vaccine  Completed   DEXA scan (bone density measurement)  Completed   Hepatitis C Screening: USPSTF Recommendation to screen - Ages 65-79 yo.  Completed   HPV Vaccine  Aged Out    Advanced directives: no  Conditions/risks identified: none  Next appointment: Follow up in one year for your annual wellness visit 04/12/2024 @ 10:15am telephone   Preventive Care 65 Years and Older, Female Preventive care refers to lifestyle choices and visits with your health care provider that can promote health and wellness. What does preventive care include? A yearly physical exam. This is also called an annual well check. Dental exams once or twice a year. Routine eye exams. Ask your health care provider how often you should have your eyes checked. Personal lifestyle choices, including: Daily care of your teeth and gums. Regular physical activity. Eating a healthy  diet. Avoiding tobacco and drug use. Limiting alcohol use. Practicing safe sex. Taking low-dose aspirin every day. Taking vitamin and mineral supplements as recommended by your health care provider. What happens during an annual well check? The services and screenings done by your health care provider during your annual well check will depend on your age, overall health, lifestyle risk factors, and family history of disease. Counseling  Your health care provider may ask you questions about your: Alcohol use. Tobacco use. Drug use. Emotional well-being. Home and relationship well-being. Sexual activity. Eating habits. History of falls. Memory and ability to understand (cognition). Work and work Astronomer. Reproductive health. Screening  You may have the following tests or measurements: Height, weight, and BMI. Blood pressure. Lipid and cholesterol levels. These may be checked every 5 years, or more frequently if you are over 59 years old. Skin check. Lung cancer screening. You may have this screening every year starting at age 15 if you have a 30-pack-year history of smoking and currently smoke or have quit within the past 15 years. Fecal occult blood test (FOBT) of the stool. You may have this test every year starting at age 49. Flexible sigmoidoscopy or colonoscopy. You may have a sigmoidoscopy every 5 years or a colonoscopy every 10 years starting at age 68. Hepatitis C blood test. Hepatitis B blood test. Sexually transmitted disease (STD) testing. Diabetes screening. This is  done by checking your blood sugar (glucose) after you have not eaten for a while (fasting). You may have this done every 1-3 years. Bone density scan. This is done to screen for osteoporosis. You may have this done starting at age 69. Mammogram. This may be done every 1-2 years. Talk to your health care provider about how often you should have regular mammograms. Talk with your health care provider about  your test results, treatment options, and if necessary, the need for more tests. Vaccines  Your health care provider may recommend certain vaccines, such as: Influenza vaccine. This is recommended every year. Tetanus, diphtheria, and acellular pertussis (Tdap, Td) vaccine. You may need a Td booster every 10 years. Zoster vaccine. You may need this after age 53. Pneumococcal 13-valent conjugate (PCV13) vaccine. One dose is recommended after age 59. Pneumococcal polysaccharide (PPSV23) vaccine. One dose is recommended after age 51. Talk to your health care provider about which screenings and vaccines you need and how often you need them. This information is not intended to replace advice given to you by your health care provider. Make sure you discuss any questions you have with your health care provider. Document Released: 01/02/2016 Document Revised: 08/25/2016 Document Reviewed: 10/07/2015 Elsevier Interactive Patient Education  2017 [employer] Prevention in the Home Falls can cause injuries. They can happen to people of all ages. There are many things you can do to make your home safe and to help prevent falls. What can I do on the outside of my home? Regularly fix the edges of walkways and driveways and fix any cracks. Remove anything that might make you trip as you walk through a door, such as a raised step or threshold. Trim any bushes or trees on the path to your home. Use bright outdoor lighting. Clear any walking paths of anything that might make someone trip, such as rocks or tools. Regularly check to see if handrails are loose or broken. Make sure that both sides of any steps have handrails. Any raised decks and porches should have guardrails on the edges. Have any leaves, snow, or ice cleared regularly. Use sand or salt on walking paths during winter. Clean up any spills in your garage right away. This includes oil or grease spills. What can I do in the bathroom? Use  night lights. Install grab bars by the toilet and in the tub and shower. Do not use towel bars as grab bars. Use non-skid mats or decals in the tub or shower. If you need to sit down in the shower, use a plastic, non-slip stool. Keep the floor dry. Clean up any water that spills on the floor as soon as it happens. Remove soap buildup in the tub or shower regularly. Attach bath mats securely with double-sided non-slip rug tape. Do not have throw rugs and other things on the floor that can make you trip. What can I do in the bedroom? Use night lights. Make sure that you have a light by your bed that is easy to reach. Do not use any sheets or blankets that are too big for your bed. They should not hang down onto the floor. Have a firm chair that has side arms. You can use this for support while you get dressed. Do not have throw rugs and other things on the floor that can make you trip. What can I do in the kitchen? Clean up any spills right away. Avoid walking on wet floors. Keep items that you  use a lot in easy-to-reach places. If you need to reach something above you, use a strong step stool that has a grab bar. Keep electrical cords out of the way. Do not use floor polish or wax that makes floors slippery. If you must use wax, use non-skid floor wax. Do not have throw rugs and other things on the floor that can make you trip. What can I do with my stairs? Do not leave any items on the stairs. Make sure that there are handrails on both sides of the stairs and use them. Fix handrails that are broken or loose. Make sure that handrails are as long as the stairways. Check any carpeting to make sure that it is firmly attached to the stairs. Fix any carpet that is loose or worn. Avoid having throw rugs at the top or bottom of the stairs. If you do have throw rugs, attach them to the floor with carpet tape. Make sure that you have a light switch at the top of the stairs and the bottom of the  stairs. If you do not have them, ask someone to add them for you. What else can I do to help prevent falls? Wear shoes that: Do not have high heels. Have rubber bottoms. Are comfortable and fit you well. Are closed at the toe. Do not wear sandals. If you use a stepladder: Make sure that it is fully opened. Do not climb a closed stepladder. Make sure that both sides of the stepladder are locked into place. Ask someone to hold it for you, if possible. Clearly mark and make sure that you can see: Any grab bars or handrails. First and last steps. Where the edge of each step is. Use tools that help you move around (mobility aids) if they are needed. These include: Canes. Walkers. Scooters. Crutches. Turn on the lights when you go into a dark area. Replace any light bulbs as soon as they burn out. Set up your furniture so you have a clear path. Avoid moving your furniture around. If any of your floors are uneven, fix them. If there are any pets around you, be aware of where they are. Review your medicines with your doctor. Some medicines can make you feel dizzy. This can increase your chance of falling. Ask your doctor what other things that you can do to help prevent falls. This information is not intended to replace advice given to you by your health care provider. Make sure you discuss any questions you have with your health care provider. Document Released: 10/02/2009 Document Revised: 05/13/2016 Document Reviewed: 01/10/2015 Elsevier Interactive Patient Education  2017 [employer].

## 2023-04-07 NOTE — Telephone Encounter (Signed)
04/07/2023 10:26 AM EDT by Sue Lush, LPN  Morgan Peterson (Self) -- Remove  Left Message - left message to rtn call for AWV  04/07/2023 10:29 AM EDT by Sue Lush, LPN  Outgoing Morgan Peterson, Morgan Peterson (Self) [phone] (Home) Remove  Left Message - lft msg to rtn call to r/s AWV

## 2023-04-07 NOTE — Progress Notes (Signed)
I connected with  Morgan Peterson on 04/07/23 by a audio enabled telemedicine application and verified that I am speaking with the correct person using two identifiers.  Patient Location: Home  Provider Location: Office/Clinic  I discussed the limitations of evaluation and management by telemedicine. The patient expressed understanding and agreed to proceed.  Subjective:   Morgan Peterson is a 72 y.o. female who presents for Medicare Annual (Subsequent) preventive examination.  Review of Systems    Cardiac Risk Factors include: advanced age (>73men, >36 women);diabetes mellitus;dyslipidemia;hypertension     Objective:    Today's Vitals   04/07/23 1037  Weight: 172 lb (78 kg)  Height: 5\' 6"  (1.676 m)   Body mass index is 27.76 kg/m.     04/07/2023   10:47 AM 03/29/2023    8:29 AM 03/08/2023   12:55 PM 02/15/2023    8:48 AM 01/25/2023   10:44 AM 01/04/2023    8:34 AM 12/21/2022   11:19 AM  Advanced Directives  Does Patient Have a Medical Advance Directive? No No No No No No No  Does patient want to make changes to medical advance directive?  No - Patient declined No - Patient declined No - Patient declined No - Patient declined No - Patient declined No - Patient declined  Would patient like information on creating a medical advance directive?      No - Patient declined No - Patient declined    Current Medications (verified) Outpatient Encounter Medications as of 04/07/2023  Medication Sig   aspirin EC 81 MG tablet Take 1 tablet (81 mg total) by mouth daily.   atenolol (TENORMIN) 25 MG tablet TAKE 1 TABLET (25 MG TOTAL) BY MOUTH DAILY.   betamethasone, augmented, (DIPROLENE) 0.05 % lotion Apply 1 application  topically 2 (two) times daily.   Blood Glucose Monitoring Suppl (ONETOUCH VERIO) w/Device KIT 1 Device by Does not apply route once.   cholecalciferol (VITAMIN D) 1000 UNITS tablet Take 1,000 Units by mouth daily.   Cyanocobalamin (VITAMIN B-12 PO) Take 1 tablet by mouth  daily as needed (fatigue).   EPINEPHrine (EPIPEN 2-PAK) 0.3 mg/0.3 mL IJ SOAJ injection Inject 0.3 mg into the muscle as needed for anaphylaxis.   famotidine (PEPCID) 20 MG tablet Take 20 mg by mouth 2 (two) times daily.   glucose blood (ONETOUCH VERIO) test strip Use as instructed   Lancets (ONETOUCH ULTRASOFT) lancets Use as instructed   levocetirizine (XYZAL) 5 MG tablet Take 1 tablet (5 mg total) by mouth every evening.   lidocaine-prilocaine (EMLA) cream Apply 1 application. topically as needed. Apply to port and cover with saran wrap 1-2 hours prior to port access (Patient not taking: Reported on 04/07/2023)   montelukast (SINGULAIR) 10 MG tablet TAKE 1 TABLET BY MOUTH EVERYDAY AT BEDTIME   Multiple Vitamin (MULTIVITAMIN WITH MINERALS) TABS tablet Take 1 tablet by mouth 2 (two) times a week.   ondansetron (ZOFRAN) 8 MG tablet Take 1 tablet (8 mg total) by mouth every 8 (eight) hours as needed for nausea or vomiting. (Patient not taking: Reported on 04/07/2023)   OZEMPIC, 0.25 OR 0.5 MG/DOSE, 2 MG/1.5ML SOPN Inject 0.5 mg into the skin every Sunday.   promethazine (PHENERGAN) 25 MG tablet Take 1 tablet (25 mg total) by mouth every 6 (six) hours as needed for nausea or vomiting. (Patient not taking: Reported on 12/21/2022)   rosuvastatin (CRESTOR) 10 MG tablet TAKE 1 TABLET (10 MG TOTAL) BY MOUTH EVERY MORNING.   spironolactone (ALDACTONE) 50 MG  tablet TAKE 1 TABLET BY MOUTH EVERY DAY   Facility-Administered Encounter Medications as of 04/07/2023  Medication   heparin lock flush 100 unit/mL    Allergies (verified) Levaquin [levofloxacin in d5w], Losartan, Metoprolol, Saxagliptin, Amlodipine-olmesartan, Canagliflozin, Egg white [egg white (egg protein)], Gabapentin, Glipizide, Gluten meal, Lisinopril, Metformin and related, Milk (cow), Milk-related compounds, Shrimp extract, Tramadol, Zyprexa [olanzapine], Actos [pioglitazone], Atorvastatin, Carvedilol, and Prednisone   History: Past Medical  History:  Diagnosis Date   Appetite loss    Asthma    Cervical radiculopathy    Chemotherapy-induced nausea    Hiatal hernia    Hypercholesterolemia    Hyperlipidemia    Hypertension    IBS (irritable bowel syndrome)    Malignant neoplasm of upper-outer quadrant of female breast 04/11/2009   Right breast, invasive ductal carcinoma, 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 Arimidex therapy completed September 2015   Mild mitral valve prolapse    Neuropathy    feet, from chemo   Personal history of malignant neoplasm of breast 2010   Personal history of radiation therapy 2010   mammosite   T2DM (type 2 diabetes mellitus) 2011   Past Surgical History:  Procedure Laterality Date   ABDOMINAL EXPLORATION SURGERY  2000   ovarian cyst   BREAST BIOPSY Right 2010   +    BREAST BIOPSY Left 04/06/2023   Left Breast Stereo Bx X Clip - path pending   BREAST BIOPSY Left 04/06/2023   Left Breast Stereo Bx, Coil Clip - path pending   BREAST BIOPSY Left 04/06/2023   MM LT BREAST BX W LOC DEV 1ST LESION IMAGE BX SPEC STEREO GUIDE 04/06/2023 [facility]   BREAST BIOPSY Left 04/06/2023   MM LT BREAST BX W LOC DEV EA AD LESION IMG BX SPEC STEREO GUIDE 04/06/2023 [facility]   BREAST EXCISIONAL BIOPSY Right 2010   + mammo site inasive mammo ca   BREAST LUMPECTOMY Right 2010   [facility]   COLONOSCOPY  07/19/2012   Normal exam, Dr. Lemar Livings   COLONOSCOPY WITH PROPOFOL N/A 04/21/2022   Procedure: COLONOSCOPY WITH PROPOFOL;  Surgeon: Earline Mayotte, MD;  Location: [facility];  Service: Endoscopy;  Laterality: N/A;   ESOPHAGOGASTRODUODENOSCOPY (EGD) WITH PROPOFOL N/A 10/22/2022   Procedure: ESOPHAGOGASTRODUODENOSCOPY (EGD) WITH PROPOFOL;  Surgeon: Midge Minium, MD;  Location: [facility];  Service: Endoscopy;  Laterality: N/A;  Diabetic   MASTECTOMY Right 2010   partial/ lumpectomy   PORTACATH PLACEMENT Left 05/19/2022    Procedure: INSERTION PORT-A-CATH;  Surgeon: Earline Mayotte, MD;  Location: [facility];  Service: General;  Laterality: Left;   SIMPLE MASTECTOMY WITH AXILLARY SENTINEL NODE BIOPSY Right 04/26/2022   Procedure: SIMPLE MASTECTOMY WITH AXILLARY SENTINEL NODE BIOPSY;  Surgeon: Earline Mayotte, MD;  Location: [facility];  Service: General;  Laterality: Right;  RNFA to assist   TUBAL LIGATION  20 years ago   Family History  Problem Relation Age of Onset   Osteoporosis Mother    Diabetes Father    Kidney disease Father    Diabetes Brother    Breast cancer Maternal Aunt    Bladder Cancer Maternal Aunt    Cervical cancer Maternal Aunt    Colon cancer Maternal Uncle    Lung cancer Maternal Uncle    Social History   Socioeconomic History   Marital status: Married    Spouse name: Antonio   Number of children: 2   Years of education: Not  on file   Highest education level: Not on file  Occupational History   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  Tobacco Use   Smoking status: Former    Packs/day: 0.25    Years: 10.00    Additional pack years: 0.00    Total pack years: 2.50    Types: Cigarettes    Start date: 33    Quit date: 1988    Years since quitting: 36.3   Smokeless tobacco: Never   Tobacco comments:    smoking cessation materials not required  Vaping Use   Vaping Use: Never used  Substance and Sexual Activity   Alcohol use: Yes    Comment: wine occ   Drug use: No   Sexual activity: Not Currently    Comment: husband has ED  Other Topics Concern   Not on file  Social History Narrative   Oncology nurse on the floor retired.;  Husband retired from [employer].  No smoking.   Social Determinants of Health   Financial Resource Strain: Low Risk  (04/07/2023)   Overall Financial Resource Strain (CARDIA)    Difficulty of Paying Living Expenses: Not hard at all  Food Insecurity: No Food Insecurity (04/07/2023)   Hunger Vital Sign    Worried About Running Out of  Food in the Last Year: Never true    Ran Out of Food in the Last Year: Never true  Transportation Needs: No Transportation Needs (04/07/2023)   PRAPARE - Administrator, Civil Service (Medical): No    Lack of Transportation (Non-Medical): No  Physical Activity: Insufficiently Active (04/07/2023)   Exercise Vital Sign    Days of Exercise per Week: 1 day    Minutes of Exercise per Session: 20 min  Stress: No Stress Concern Present (04/07/2023)   [employer] of Occupational Health - Occupational Stress Questionnaire    Feeling of Stress : Not at all  Social Connections: Moderately Integrated (04/07/2023)   Social Connection and Isolation Panel [NHANES]    Frequency of Communication with Friends and Family: More than three times a week    Frequency of Social Gatherings with Friends and Family: Three times a week    Attends Religious Services: More than 4 times per year    Active Member of Clubs or Organizations: No    Attends Banker Meetings: Never    Marital Status: Married    Tobacco Counseling Counseling given: Not Answered Tobacco comments: smoking cessation materials not required   Clinical Intake:  Pre-visit preparation completed: Yes  Pain : No/denies pain   BMI - recorded: 27.76 Nutritional Status: BMI 25 -29 Overweight Nutritional Risks: None Diabetes: Yes CBG done?: No (pt done at home @ [facility]) Did pt. bring in CBG monitor from home?: No  How often do you need to have someone help you when you read instructions, pamphlets, or other written materials from your doctor or pharmacy?: 1 - Never  Diabetic?yes  Interpreter Needed?: No  Comments: lives w/husband Information entered by :: B.Koraline Phillipson,LPN  Activities of Daily Living    04/07/2023   10:47 AM 10/22/2022    7:21 AM  In your present state of health, do you have any difficulty performing the following activities:  Hearing? 0 0  Vision? 0 0  Difficulty concentrating or making  decisions? 0 0  Walking or climbing stairs? 0 0  Dressing or bathing? 0 0  Doing errands, shopping? 0   Preparing Food and eating ? N  Using the Toilet? N   In the past six months, have you accidently leaked urine? N   Do you have problems with loss of bowel control? N   Managing your Medications? N   Managing your Finances? N   Housekeeping or managing your Housekeeping? N     Patient Care Team: Alba Cory, MD as PCP - General (Family Medicine) Lemar Livings, Merrily Pew, MD (General Surgery) Bud Face, MD as Referring Physician (Otolaryngology) Alwyn Pea, MD as Consulting Physician (Cardiology) Jesusita Oka, MD as Referring Physician (Dermatology) Sherlon Handing, MD as Consulting Physician (Internal Medicine) Earna Coder, MD as Consulting Physician (Internal Medicine)  Indicate any recent Medical Services you may have received from other than [facility] providers in the past year (date may be approximate).     Assessment:   This is a routine wellness examination for Morgan Peterson.  Hearing/Vision screen Hearing Screening - Comments:: Adequate hearing Vision Screening - Comments:: Adequate vision w/glasses Dr Clydene Pugh  Dietary issues and exercise activities discussed: Current Exercise Habits: Home exercise routine, Time (Minutes): 20, Frequency (Times/Week): 1, Weekly Exercise (Minutes/Week): 20, Intensity: Mild, Exercise limited by: orthopedic condition(s)   Goals Addressed   None    Depression Screen    04/07/2023   10:45 AM 08/27/2022    3:35 PM 08/05/2022   11:37 AM 01/11/2022    3:05 PM 12/31/2021    9:16 AM 12/24/2021   11:43 AM 08/12/2021    9:00 AM  PHQ 2/9 Scores  PHQ - 2 Score 0 0 0 0 0 0 0  PHQ- 9 Score  1 3  0      Fall Risk    04/07/2023   10:40 AM 08/27/2022    3:35 PM 08/05/2022   11:36 AM 01/11/2022    3:05 PM 12/31/2021    9:16 AM  Fall Risk   Falls in the past year? 0 0 0 0 0  Number falls in past yr: 0  0 0 0  Injury with  Fall? 0  0 0 0  Risk for fall due to : No Fall Risks No Fall Risks No Fall Risks  No Fall Risks  Follow up Education provided;Falls prevention discussed Falls prevention discussed;Education provided;Falls evaluation completed Falls prevention discussed Falls evaluation completed Falls prevention discussed    FALL RISK PREVENTION PERTAINING TO THE HOME:  Any stairs in or around the home? Yes  If so, are there any without handrails? Yes  Home free of loose throw rugs in walkways, pet beds, electrical cords, etc? Yes  Adequate lighting in your home to reduce risk of falls? Yes   ASSISTIVE DEVICES UTILIZED TO PREVENT FALLS:  Life alert? No  Use of a cane, walker or w/c? No  Grab bars in the bathroom? Yes  Shower chair or bench in shower? Yes  Elevated toilet seat or a handicapped toilet? No   Cognitive Function:        04/07/2023   10:49 AM  6CIT Screen  What Year? 0 points  What month? 0 points  What time? 0 points  Count back from 20 0 points  Months in reverse 0 points  Repeat phrase 0 points  Total Score 0 points    Immunizations Immunization History  Administered Date(s) Administered   Fluad Quad(high Dose 65+) 10/09/2019, 10/13/2020   Influenza Split 11/13/2012   Influenza, High Dose Seasonal PF 08/18/2017, 10/13/2018, 10/13/2021   Influenza, Seasonal, Injecte, Preservative Fre 11/19/2010, 08/17/2011   Influenza,inj,Quad PF,6+ Mos 11/07/2013, 11/21/2014, 10/03/2015, 09/03/2016  Influenza-Unspecified 11/07/2013, 11/21/2014, 10/03/2015, 09/03/2016   PFIZER(Purple Top)SARS-COV-2 Vaccination 03/31/2020, 04/23/2020, 12/10/2020   PPD Test 07/22/2021   Pneumococcal Conjugate-13 05/14/2014, 11/06/2019   Pneumococcal Polysaccharide-23 11/19/2010, 11/26/2016   Tdap 11/19/2010, 01/20/2021   Zoster, Live 07/23/2013    TDAP status: Up to date  Flu Vaccine status: Declined, Education has been provided regarding the importance of this vaccine but patient still declined.  Advised may receive this vaccine at local pharmacy or Health Dept. Aware to provide a copy of the vaccination record if obtained from local pharmacy or Health Dept. Verbalized acceptance and understanding.  Pneumococcal vaccine status: Up to date  Covid-19 vaccine status: Completed vaccines  Qualifies for Shingles Vaccine? Yes   Zostavax completed No   Shingrix Completed?: No.    Education has been provided regarding the importance of this vaccine. Patient has been advised to call insurance company to determine out of pocket expense if they have not yet received this vaccine. Advised may also receive vaccine at local pharmacy or Health Dept. Verbalized acceptance and understanding.  Screening Tests Health Maintenance  Topic Date Due   Zoster Vaccines- Shingrix (1 of 2) Never done   Diabetic kidney evaluation - Urine ACR  01/31/2020   OPHTHALMOLOGY EXAM  06/05/2020   FOOT EXAM  01/20/2022   HEMOGLOBIN A1C  08/03/2022   COVID-19 Vaccine (4 - 2023-24 season) 08/20/2022   INFLUENZA VACCINE  07/21/2023   Diabetic kidney evaluation - eGFR measurement  03/28/2024   Medicare Annual Wellness (AWV)  04/06/2024   MAMMOGRAM  03/21/2025   DTaP/Tdap/Td (3 - Td or Tdap) 01/20/2031   COLONOSCOPY (Pts 45-27yrs Insurance coverage will need to be confirmed)  04/21/2032   Pneumonia Vaccine 74+ Years old  Completed   DEXA SCAN  Completed   Hepatitis C Screening  Completed   HPV VACCINES  Aged Out    Health Maintenance  Health Maintenance Due  Topic Date Due   Zoster Vaccines- Shingrix (1 of 2) Never done   Diabetic kidney evaluation - Urine ACR  01/31/2020   OPHTHALMOLOGY EXAM  06/05/2020   FOOT EXAM  01/20/2022   HEMOGLOBIN A1C  08/03/2022   COVID-19 Vaccine (4 - 2023-24 season) 08/20/2022    Colorectal cancer screening: No longer required.   Mammogram status: No longer required due to age.  Lung Cancer Screening: (Low Dose CT Chest recommended if Age 35-80 years, 30 pack-year currently  smoking OR have quit w/in 15years.) does not qualify.   Lung Cancer Screening Referral: no  Additional Screening:  Hepatitis C Screening: does not qualify; Completed yes  Vision Screening: Recommended annual ophthalmology exams for early detection of glaucoma and other disorders of the eye. Is the patient up to date with their annual eye exam?  Yes  Who is the provider or what is the name of the office in which the patient attends annual eye exams? Dr Clydene Pugh If pt is not established with a provider, would they like to be referred to a provider to establish care? No .   Dental Screening: Recommended annual dental exams for proper oral hygiene  Community Resource Referral / Chronic Care Management: CRR required this visit?  No   CCM required this visit?  No     Plan:    I have personally reviewed and noted the following in the patient's chart:   Medical and social history Use of alcohol, tobacco or illicit drugs  Current medications and supplements including opioid prescriptions. Patient is not currently taking opioid prescriptions. Functional ability  and status Nutritional status Physical activity Advanced directives List of other physicians Hospitalizations, surgeries, and ER visits in previous 12 months Vitals Screenings to include cognitive, depression, and falls Referrals and appointments  In addition, I have reviewed and discussed with patient certain preventive protocols, quality metrics, and best practice recommendations. A written personalized care plan for preventive services as well as general preventive health recommendations were provided to patient.    Sue Lush, LPN   1/61/0960   Nurse Notes: The patient states she is doing well and has no concerns or questions at this time.

## 2023-04-08 ENCOUNTER — Other Ambulatory Visit: Payer: Self-pay

## 2023-04-11 NOTE — Progress Notes (Signed)
Name: Morgan Peterson   MRN: [id number]    DOB: 04/09/1951   Date:04/12/2023       Progress Note  Subjective  Chief Complaint  Follow Up  HPI  HTN: she used to take triamterene hctz but was having hypokalemia even while taking potassium, we switched to spironolactone and potassium is back to normal but she is noticing swelling on both ankles. We  adjusted dose to 50 mg , bp is towards low end of normal we will decrease dose of atenolol to half pill daily,  she denies dizziness  Chemotherapy induced neuropathy: she was given gabapentin but stopped due to tingling in her tongue, we discussed Duloxetine but decided not to started . She states symptoms are stable. 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 She saw allergist and states further test was negative.   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 She had mastectomy done by Dr. Birdie Sons and is now getting chemotherapy / Oncologist is Dr. Donneta Romberg, finished chemotherapy Dec 2023 , currently taking Kegyruda total of 9 cycles should be done in July , up to date with mammograms  DM type II with dyslipidemia. : she is seeing  Endocrinologist Dr. Gershon Crane , last A1C was at goal at 6.6% done Nov 2023  She has associated dyslipidemia and obesity. She. Denies polyphagia, polydipsia or polyuria . Continue Crestor for dyslipidemia, allergic to ACE/ARB. We will check lipid panel and urine micro today . Glucose at home has been 97-109    Migraine headaches: she was seen by Dr. Sherryll Burger, had labs done in May 22, and MRI was unremarkable. 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 She states her bowel movements are better since she has been eating oatmeal and more fiber in her diet, no longer taking Miralax. Back to baseline since she had chemotherapy    Thyroid nodule: monitored by Dr. Turner Daniels, last TSH normal , continue regular follow ups. Unchanged    Carotid atherosclerosis: found on Cspine x-ray, also on exam today she has a pulstaing mass on right side of neck . Unchanged   IMPRESSION: 12/2021 carotid doppler  No evidence of hemodynamically significant stenosis involving either the right or left carotid circulation in the neck by Doppler criteria. Minimal plaque in the right carotid bulb results in less than 50% stenosis. No significant plaque appreciated in the left carotid circulation.    Intermittent low back pain: dull aching, sometimes burning, no radiculitis, doing well lately    Patient Active Problem List   Diagnosis Date Noted   Melena 10/22/2022   Other diseases of stomach and duodenum 10/22/2022   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   Bilateral lower extremity edema 08/27/2022   Hives 08/27/2022   Food allergy 08/05/2022   Atherosclerosis of both carotid arteries 08/05/2022   Migraine aura without headache 08/05/2022   Carcinoma of upper-outer quadrant of right breast in female, estrogen receptor negative 05/26/2022   Breast cancer 05/10/2022   Goals of care, counseling/discussion 05/10/2022   Encounter for monitoring cardiotoxic drug therapy 05/10/2022   Carotid stenosis 04/21/2022   Hypertension  associated with type 2 diabetes mellitus 01/20/2021   Intermittent low back pain 08/18/2017   Irritable bowel syndrome with constipation 05/02/2017   Vitamin D deficiency 09/09/2016   Hiatal hernia 06/02/2016   Allergic rhinitis, seasonal 06/02/2016   Obesity (BMI 30-39.9) 04/30/2016   Angio-edema 04/30/2016   Hyperlipidemia 11/04/2015   Hyperlipidemia due to type 2 diabetes mellitus 11/04/2015    Cervical radiculopathy at C6 07/31/2015   Neuroma digital nerve 07/31/2015   Dyslipidemia associated with type 2 diabetes mellitus 06/30/2015   Cervical disc disease 06/30/2015   Essential hypertension 06/30/2015   History of breast cancer in female 03/23/2009    Past Surgical History:  Procedure Laterality Date   ABDOMINAL EXPLORATION SURGERY  2000   ovarian cyst   BREAST BIOPSY Right 2010   +    BREAST BIOPSY Left 04/06/2023   Left Breast Stereo Bx X Clip - path pending   BREAST BIOPSY Left 04/06/2023   Left Breast Stereo Bx, Coil Clip - path pending   BREAST BIOPSY Left 04/06/2023   MM LT BREAST BX W LOC DEV 1ST LESION IMAGE BX SPEC STEREO GUIDE 04/06/2023 [facility]   BREAST BIOPSY Left 04/06/2023   MM LT BREAST BX W LOC DEV EA AD LESION IMG BX SPEC STEREO GUIDE 04/06/2023 [facility]   BREAST EXCISIONAL BIOPSY Right 2010   + mammo site inasive mammo ca   BREAST LUMPECTOMY Right 2010   [facility]   COLONOSCOPY  07/19/2012   Normal exam, Dr. Lemar Livings   COLONOSCOPY WITH PROPOFOL N/A 04/21/2022   Procedure: COLONOSCOPY WITH PROPOFOL;  Surgeon: Earline Mayotte, MD;  Location: [facility];  Service: Endoscopy;  Laterality: N/A;   ESOPHAGOGASTRODUODENOSCOPY (EGD) WITH PROPOFOL N/A 10/22/2022   Procedure: ESOPHAGOGASTRODUODENOSCOPY (EGD) WITH PROPOFOL;  Surgeon: Midge Minium, MD;  Location: [facility];  Service: Endoscopy;  Laterality: N/A;  Diabetic   MASTECTOMY Right 2010   partial/ lumpectomy   PORTACATH PLACEMENT Left 05/19/2022   Procedure: INSERTION PORT-A-CATH;  Surgeon: Earline Mayotte, MD;  Location: [facility];  Service: General;  Laterality: Left;   SIMPLE MASTECTOMY WITH AXILLARY SENTINEL NODE BIOPSY Right 04/26/2022   Procedure: SIMPLE MASTECTOMY WITH AXILLARY SENTINEL NODE BIOPSY;  Surgeon: Earline Mayotte, MD;  Location: [facility];  Service: General;  Laterality: Right;  RNFA to assist   TUBAL LIGATION  20 years ago    Family History  Problem  Relation Age of Onset   Osteoporosis Mother    Diabetes Father    Kidney disease Father    Diabetes Brother    Breast cancer Maternal Aunt    Bladder Cancer Maternal Aunt    Cervical cancer Maternal Aunt    Colon cancer Maternal Uncle    Lung cancer Maternal Uncle     Social History   Tobacco Use   Smoking status: Former    Packs/day: 0.25    Years: 10.00    Additional pack years: 0.00    Total pack years: 2.50    Types: Cigarettes    Start date: 23    Quit date: 1988    Years since quitting: 36.3   Smokeless tobacco: Never   Tobacco comments:    smoking cessation materials not required  Substance Use Topics   Alcohol use: Yes    Comment: wine occ     Current Outpatient Medications:    aspirin EC 81 MG tablet, Take 1 tablet (81 mg total) by mouth daily., Disp: 30 tablet, Rfl: 0   betamethasone, augmented, (DIPROLENE) 0.05 %  lotion, Apply 1 application  topically 2 (two) times daily., Disp: , Rfl:    Blood Glucose Monitoring Suppl (ONETOUCH VERIO) w/Device KIT, 1 Device by Does not apply route once., Disp: 1 kit, Rfl: 0   cholecalciferol (VITAMIN D) 1000 UNITS tablet, Take 1,000 Units by mouth daily., Disp: , Rfl:    Cyanocobalamin (VITAMIN B-12 PO), Take 1 tablet by mouth daily as needed (fatigue)., Disp: , Rfl:    EPINEPHrine (EPIPEN 2-PAK) 0.3 mg/0.3 mL IJ SOAJ injection, Inject 0.3 mg into the muscle as needed for anaphylaxis., Disp: 2 each, Rfl: 0   glucose blood (ONETOUCH VERIO) test strip, Use as instructed, Disp: 100 each, Rfl: 12   Lancets (ONETOUCH ULTRASOFT) lancets, Use as instructed, Disp: 100 each, Rfl: 3   Multiple Vitamin (MULTIVITAMIN WITH MINERALS) TABS tablet, Take 1 tablet by mouth 2 (two) times a week., Disp: , Rfl:    OZEMPIC, 0.25 OR 0.5 MG/DOSE, 2 MG/1.5ML SOPN, Inject 0.5 mg into the skin every Sunday., Disp: , Rfl:    atenolol (TENORMIN) 25 MG tablet, Take 0.5 tablets (12.5 mg total) by mouth daily., Disp: 45 tablet, Rfl: 1   famotidine (PEPCID)  20 MG tablet, Take 1 tablet (20 mg total) by mouth 2 (two) times daily., Disp: 180 tablet, Rfl: 1   levocetirizine (XYZAL) 5 MG tablet, Take 1 tablet (5 mg total) by mouth every evening., Disp: 90 tablet, Rfl: 1   montelukast (SINGULAIR) 10 MG tablet, TAKE 1 TABLET BY MOUTH EVERYDAY AT BEDTIME, Disp: 90 tablet, Rfl: 1   rosuvastatin (CRESTOR) 10 MG tablet, Take 1 tablet (10 mg total) by mouth every morning., Disp: 90 tablet, Rfl: 1   spironolactone (ALDACTONE) 50 MG tablet, Take 1 tablet (50 mg total) by mouth daily., Disp: 90 tablet, Rfl: 1 No current facility-administered medications for this visit.  Facility-Administered Medications Ordered in Other Visits:    heparin lock flush 100 unit/mL, 500 Units, Intravenous, Once, Brahmanday, Worthy Flank, MD  Allergies  Allergen Reactions   Levaquin [Levofloxacin In D5w] Swelling    Other reaction(s): Arthralgia (Joint Pain)   Losartan     Other reaction(s): Angioedema   Metoprolol Swelling    Lip and tongue tingling and numbness   Saxagliptin Other (See Comments)    Increased PVC's  Other reaction(s): irregular heart rate   Amlodipine-Olmesartan    Canagliflozin Other (See Comments)    Bladder pain   Egg White [Egg White (Egg Protein)]     Unknown reaction   Gabapentin     cns side effects.   Glipizide     Other reaction(s): Abdominal pain   Gluten Meal    Lisinopril Swelling    Angioedema   Metformin And Related Nausea Only   Milk (Cow)    Milk-Related Compounds    Shrimp Extract     Other reaction(s): Other (See Comments)   Tramadol Nausea And Vomiting   Zyprexa [Olanzapine]    Actos [Pioglitazone] Other (See Comments) and Nausea And Vomiting    Bladder pain   Atorvastatin Other (See Comments)    Other reaction(s): Abdominal Pain, Other (See Comments)   Carvedilol Other (See Comments)    NUMBNESS, TINGLING, ACHING IN ARMS   Prednisone Palpitations    I personally reviewed active problem list, medication list, allergies,  family history, social history, health maintenance with the patient/caregiver today.   ROS  Ten systems reviewed and is negative except as mentioned in HPI  Objective  Vitals:   04/12/23 0956  BP: 118/72  Pulse: 93  Resp: 16  SpO2: 97%  Weight: 172 lb (78 kg)  Height: 5\' 6"  (1.676 m)    Body mass index is 27.76 kg/m.  Physical Exam  Constitutional: Patient appears well-developed and well-nourished.  No distress.  HEENT: head atraumatic, normocephalic, pupils equal and reactive to light, neck supple Cardiovascular: Normal rate, regular rhythm and normal heart sounds.  No murmur heard. No BLE edema. Pulmonary/Chest: Effort normal and breath sounds normal. No respiratory distress. Abdominal: Soft.  There is no tenderness. Psychiatric: Patient has a normal mood and affect. behavior is normal. Judgment and thought content normal.    Diabetic Foot Exam: Diabetic Foot Exam - Simple   Simple Foot Form Visual Inspection No deformities, no ulcerations, no other skin breakdown bilaterally: Yes Sensation Testing Intact to touch and monofilament testing bilaterally: Yes Pulse Check Posterior Tibialis and Dorsalis pulse intact bilaterally: Yes Comments      PHQ2/9:    04/12/2023    9:55 AM 04/07/2023   10:45 AM 08/27/2022    3:35 PM 08/05/2022   11:37 AM 01/11/2022    3:05 PM  Depression screen PHQ 2/9  Decreased Interest 0 0 0 0 0  Down, Depressed, Hopeless 0 0 0 0 0  PHQ - 2 Score 0 0 0 0 0  Altered sleeping 0  0 0   Tired, decreased energy 0  1 3   Change in appetite 0  0 0   Feeling bad or failure about yourself  0  0 0   Trouble concentrating 0  0 0   Moving slowly or fidgety/restless 0  0 0   Suicidal thoughts 0  0 0   PHQ-9 Score 0  1 3     phq 9 is negative   Fall Risk:    04/12/2023    9:55 AM 04/07/2023   10:40 AM 08/27/2022    3:35 PM 08/05/2022   11:36 AM 01/11/2022    3:05 PM  Fall Risk   Falls in the past year? 0 0 0 0 0  Number falls in past yr: 0  0  0 0  Injury with Fall? 0 0  0 0  Risk for fall due to : No Fall Risks No Fall Risks No Fall Risks No Fall Risks   Follow up Falls prevention discussed Education provided;Falls prevention discussed Falls prevention discussed;Education provided;Falls evaluation completed Falls prevention discussed Falls evaluation completed      Functional Status Survey: Is the patient deaf or have difficulty hearing?: No Does the patient have difficulty seeing, even when wearing glasses/contacts?: No Does the patient have difficulty concentrating, remembering, or making decisions?: No Does the patient have difficulty walking or climbing stairs?: No Does the patient have difficulty dressing or bathing?: No Does the patient have difficulty doing errands alone such as visiting a doctor's office or shopping?: No    Assessment & Plan   1. Dyslipidemia associated with type 2 diabetes mellitus  - Urine Microalbumin w/creat. ratio - HM Diabetes Foot Exam - Lipid panel - rosuvastatin (CRESTOR) 10 MG tablet; Take 1 tablet (10 mg total) by mouth every morning.  Dispense: 90 tablet; Refill: 1  2. Essential hypertension  - atenolol (TENORMIN) 25 MG tablet; Take 0.5 tablets (12.5 mg total) by mouth daily.  Dispense: 45 tablet; Refill: 1 - spironolactone (ALDACTONE) 50 MG tablet; Take 1 tablet (50 mg total) by mouth daily.  Dispense: 90 tablet; Refill: 1  3. Seasonal allergic rhinitis, unspecified trigger  - levocetirizine (XYZAL)  5 MG tablet; Take 1 tablet (5 mg total) by mouth every evening.  Dispense: 90 tablet; Refill: 1 - montelukast (SINGULAIR) 10 MG tablet; TAKE 1 TABLET BY MOUTH EVERYDAY AT BEDTIME  Dispense: 90 tablet; Refill: 1  4. Carcinoma of upper-outer quadrant of right breast in female, estrogen receptor negative  Keep follow up with Dr. B  5. Hives  - montelukast (SINGULAIR) 10 MG tablet; TAKE 1 TABLET BY MOUTH EVERYDAY AT BEDTIME  Dispense: 90 tablet; Refill: 1  6.  Chemotherapy-induced neuropathy  Stable  7. Vitamin D deficiency  Continue supplementation  8. Atherosclerosis of both carotid arteries  On statin therapy and aspirin  9. Intermittent low back pain  Doing well   10. Food allergy  Reviewed with patient  11. Irritable bowel syndrome with constipation   12. Migraine aura without headache   13. GERD without esophagitis  - famotidine (PEPCID) 20 MG tablet; Take 1 tablet (20 mg total) by mouth 2 (two) times daily.  Dispense: 180 tablet; Refill: 1

## 2023-04-12 ENCOUNTER — Ambulatory Visit (INDEPENDENT_AMBULATORY_CARE_PROVIDER_SITE_OTHER): Payer: Medicare Other | Admitting: Family Medicine

## 2023-04-12 VITALS — BP 118/72 | HR 93 | Resp 16 | Ht 66.0 in | Wt 172.0 lb

## 2023-04-12 DIAGNOSIS — Z91018 Allergy to other foods: Secondary | ICD-10-CM | POA: Diagnosis not present

## 2023-04-12 DIAGNOSIS — K581 Irritable bowel syndrome with constipation: Secondary | ICD-10-CM | POA: Diagnosis not present

## 2023-04-12 DIAGNOSIS — I6523 Occlusion and stenosis of bilateral carotid arteries: Secondary | ICD-10-CM | POA: Diagnosis not present

## 2023-04-12 DIAGNOSIS — E559 Vitamin D deficiency, unspecified: Secondary | ICD-10-CM

## 2023-04-12 DIAGNOSIS — E1169 Type 2 diabetes mellitus with other specified complication: Secondary | ICD-10-CM | POA: Diagnosis not present

## 2023-04-12 DIAGNOSIS — I10 Essential (primary) hypertension: Secondary | ICD-10-CM | POA: Diagnosis not present

## 2023-04-12 DIAGNOSIS — E785 Hyperlipidemia, unspecified: Secondary | ICD-10-CM

## 2023-04-12 DIAGNOSIS — G43109 Migraine with aura, not intractable, without status migrainosus: Secondary | ICD-10-CM

## 2023-04-12 DIAGNOSIS — M545 Low back pain, unspecified: Secondary | ICD-10-CM

## 2023-04-12 DIAGNOSIS — L509 Urticaria, unspecified: Secondary | ICD-10-CM

## 2023-04-12 DIAGNOSIS — Z171 Estrogen receptor negative status [ER-]: Secondary | ICD-10-CM

## 2023-04-12 DIAGNOSIS — G620 Drug-induced polyneuropathy: Secondary | ICD-10-CM | POA: Diagnosis not present

## 2023-04-12 DIAGNOSIS — C50411 Malignant neoplasm of upper-outer quadrant of right female breast: Secondary | ICD-10-CM | POA: Diagnosis not present

## 2023-04-12 DIAGNOSIS — T451X5A Adverse effect of antineoplastic and immunosuppressive drugs, initial encounter: Secondary | ICD-10-CM

## 2023-04-12 DIAGNOSIS — K219 Gastro-esophageal reflux disease without esophagitis: Secondary | ICD-10-CM

## 2023-04-12 DIAGNOSIS — J302 Other seasonal allergic rhinitis: Secondary | ICD-10-CM

## 2023-04-12 MED ORDER — MONTELUKAST SODIUM 10 MG PO TABS: ORAL_TABLET | ORAL | 1 refills | Status: DC

## 2023-04-12 MED ORDER — LEVOCETIRIZINE DIHYDROCHLORIDE 5 MG PO TABS: 5.0000 mg | ORAL_TABLET | Freq: Every evening | ORAL | 1 refills | Status: DC

## 2023-04-12 MED ORDER — ROSUVASTATIN CALCIUM 10 MG PO TABS
10.0000 mg | ORAL_TABLET | ORAL | 1 refills | Status: DC
Start: 2023-04-12 — End: 2023-10-11

## 2023-04-12 MED ORDER — ATENOLOL 25 MG PO TABS
12.5000 mg | ORAL_TABLET | Freq: Every day | ORAL | 1 refills | Status: DC
Start: 2023-04-12 — End: 2023-09-12

## 2023-04-12 MED ORDER — SPIRONOLACTONE 50 MG PO TABS: 50.0000 mg | ORAL_TABLET | Freq: Every day | ORAL | 1 refills | Status: DC

## 2023-04-12 MED ORDER — FAMOTIDINE 20 MG PO TABS: 20.0000 mg | ORAL_TABLET | Freq: Two times a day (BID) | ORAL | 1 refills | Status: DC

## 2023-04-13 LAB — MICROALBUMIN / CREATININE URINE RATIO
Creatinine, Urine: 364 mg/dL — ABNORMAL HIGH (ref 20–275)
Microalb Creat Ratio: 2 mg/g creat (ref <30)
Microalb, Ur: 0.9 mg/dL

## 2023-04-13 LAB — LIPID PANEL
Cholesterol: 141 mg/dL (ref <200)
HDL: 51 mg/dL (ref >=50)
LDL Cholesterol (Calc): 74 mg/dL (calc)
Non-HDL Cholesterol (Calc): 90 mg/dL (calc) (ref <130)
Total CHOL/HDL Ratio: 2.8 (calc) (ref <5.0)
Triglycerides: 78 mg/dL (ref <150)

## 2023-04-18 ENCOUNTER — Ambulatory Visit: Payer: Self-pay | Admitting: *Deleted

## 2023-04-18 ENCOUNTER — Ambulatory Visit: Payer: Self-pay

## 2023-04-18 NOTE — Telephone Encounter (Signed)
Pt called, no answer, reviewed chart and pt has already been triaged by another nurse. Will close out encounter d/t it has been routed to provider for review.

## 2023-04-18 NOTE — Telephone Encounter (Signed)
Patient called, left VM to return the call to the office to discuss medication with a nurse.       Summary: how to take medication    Patient called stated she was advised to take half of the atenolol (TENORMIN) 25 MG tablet. She stated its not scored she can't half the pill. Please advise patient.            Please advise regarding if 12.5 mg atenolol medication dose can  be ordered or if ok to split 25 mg dose in half due to no score on pill to split. Please advise. Attempted to contact office and no answer       Reason for Disposition  [1] Caller has URGENT medicine question about med that PCP or specialist prescribed AND [2] triager unable to answer question  Answer Assessment - Initial Assessment Questions 1. NAME of MEDICINE: "What medicine(s) are you calling about?"     Atenolol 25 mg  2. QUESTION: "What is your question?" (e.g., double dose of medicine, side effect)     Order to take 1/2 dose of atenolol instead of 25 mg. Pill does not have a score to break in half. Can patient split pill in half or needs another dose sent to pharmacy  3. PRESCRIBER: "Who prescribed the medicine?" Reason: if prescribed by specialist, call should be referred to that group.     PCP 4. SYMPTOMS: "Do you have any symptoms?" If Yes, ask: "What symptoms are you having?"  "How bad are the symptoms (e.g., mild, moderate, severe)     na 5. PREGNANCY:  "Is there any chance that you are pregnant?" "When was your last menstrual period?"     na  Protocols used: Medication Question Call-A-AH

## 2023-04-18 NOTE — Telephone Encounter (Signed)
Patient called, left VM to return the call to the office to discuss medication with a nurse.   Summary: how to take medication   Patient called stated she was advised to take half of the atenolol (TENORMIN) 25 MG tablet. She stated its not scored she can't half the pill. Please advise patient.

## 2023-04-19 ENCOUNTER — Inpatient Hospital Stay: Payer: Medicare Other

## 2023-04-19 ENCOUNTER — Encounter: Payer: Self-pay | Admitting: Internal Medicine

## 2023-04-19 ENCOUNTER — Inpatient Hospital Stay (HOSPITAL_BASED_OUTPATIENT_CLINIC_OR_DEPARTMENT_OTHER): Payer: Medicare Other | Admitting: Internal Medicine

## 2023-04-19 VITALS — BP 120/59 | HR 84 | Temp 97.2°F | Ht 66.0 in | Wt 172.8 lb

## 2023-04-19 DIAGNOSIS — C50811 Malignant neoplasm of overlapping sites of right female breast: Secondary | ICD-10-CM

## 2023-04-19 DIAGNOSIS — Z171 Estrogen receptor negative status [ER-]: Secondary | ICD-10-CM | POA: Diagnosis not present

## 2023-04-19 DIAGNOSIS — C50411 Malignant neoplasm of upper-outer quadrant of right female breast: Secondary | ICD-10-CM | POA: Diagnosis not present

## 2023-04-19 DIAGNOSIS — Z7982 Long term (current) use of aspirin: Secondary | ICD-10-CM | POA: Diagnosis not present

## 2023-04-19 DIAGNOSIS — Z9011 Acquired absence of right breast and nipple: Secondary | ICD-10-CM | POA: Diagnosis not present

## 2023-04-19 DIAGNOSIS — Z87891 Personal history of nicotine dependence: Secondary | ICD-10-CM | POA: Diagnosis not present

## 2023-04-19 DIAGNOSIS — Z5112 Encounter for antineoplastic immunotherapy: Secondary | ICD-10-CM | POA: Diagnosis not present

## 2023-04-19 DIAGNOSIS — Z79899 Other long term (current) drug therapy: Secondary | ICD-10-CM | POA: Diagnosis not present

## 2023-04-19 DIAGNOSIS — Z923 Personal history of irradiation: Secondary | ICD-10-CM | POA: Diagnosis not present

## 2023-04-19 DIAGNOSIS — I10 Essential (primary) hypertension: Secondary | ICD-10-CM | POA: Diagnosis not present

## 2023-04-19 LAB — CBC WITH DIFFERENTIAL/PLATELET
Abs Immature Granulocytes: 0.02 10*3/uL (ref 0.00–0.07)
Basophils Absolute: 0 10*3/uL (ref 0.0–0.1)
Basophils Relative: 1 %
Eosinophils Absolute: 0.1 10*3/uL (ref 0.0–0.5)
Eosinophils Relative: 2 %
HCT: 32.5 % — ABNORMAL LOW (ref 36.0–46.0)
Hemoglobin: 10.3 g/dL — ABNORMAL LOW (ref 12.0–15.0)
Immature Granulocytes: 0 %
Lymphocytes Relative: 20 %
Lymphs Abs: 1 10*3/uL (ref 0.7–4.0)
MCH: 29.6 pg (ref 26.0–34.0)
MCHC: 31.7 g/dL (ref 30.0–36.0)
MCV: 93.4 fL (ref 80.0–100.0)
Monocytes Absolute: 0.3 10*3/uL (ref 0.1–1.0)
Monocytes Relative: 6 %
Neutro Abs: 3.6 10*3/uL (ref 1.7–7.7)
Neutrophils Relative %: 71 %
Platelets: 160 10*3/uL (ref 150–400)
RBC: 3.48 MIL/uL — ABNORMAL LOW (ref 3.87–5.11)
RDW: 14 % (ref 11.5–15.5)
WBC: 5.1 10*3/uL (ref 4.0–10.5)
nRBC: 0 % (ref 0.0–0.2)

## 2023-04-19 LAB — COMPREHENSIVE METABOLIC PANEL
ALT: 14 U/L (ref 0–44)
AST: 24 U/L (ref 15–41)
Albumin: 3.4 g/dL — ABNORMAL LOW (ref 3.5–5.0)
Alkaline Phosphatase: 72 U/L (ref 38–126)
Anion gap: 8 (ref 5–15)
BUN: 13 mg/dL (ref 8–23)
CO2: 22 mmol/L (ref 22–32)
Calcium: 8.7 mg/dL — ABNORMAL LOW (ref 8.9–10.3)
Chloride: 104 mmol/L (ref 98–111)
Creatinine, Ser: 0.92 mg/dL (ref 0.44–1.00)
GFR, Estimated: >60 mL/min (ref >60)
Glucose, Bld: 168 mg/dL — ABNORMAL HIGH (ref 70–99)
Potassium: 3.8 mmol/L (ref 3.5–5.1)
Sodium: 134 mmol/L — ABNORMAL LOW (ref 135–145)
Total Bilirubin: 0.4 mg/dL (ref 0.3–1.2)
Total Protein: 6.7 g/dL (ref 6.5–8.1)

## 2023-04-19 LAB — TSH: TSH: 1.325 u[IU]/mL (ref 0.350–4.500)

## 2023-04-19 MED ORDER — SODIUM CHLORIDE 0.9 % IV SOLN
Freq: Once | INTRAVENOUS | Status: AC
  Filled 2023-04-19: qty 250

## 2023-04-19 MED ORDER — SODIUM CHLORIDE 0.9% FLUSH
10.0000 mL | Freq: Once | INTRAVENOUS | Status: AC
  Administered 2023-04-19: 10 mL via INTRAVENOUS
  Filled 2023-04-19: qty 10

## 2023-04-19 MED ORDER — SODIUM CHLORIDE 0.9 % IV SOLN
200.0000 mg | Freq: Once | INTRAVENOUS | Status: AC
  Administered 2023-04-19: 200 mg via INTRAVENOUS
  Filled 2023-04-19: qty 8

## 2023-04-19 MED ORDER — HEPARIN SOD (PORK) LOCK FLUSH 100 UNIT/ML IV SOLN
500.0000 [IU] | Freq: Once | INTRAVENOUS | Status: AC | PRN
  Administered 2023-04-19: 500 [IU]
  Filled 2023-04-19: qty 5

## 2023-04-19 NOTE — Patient Instructions (Signed)
[facility]  Discharge Instructions: Thank you for choosing [facility] to provide your oncology and hematology care.  If you have a lab appointment with the Cancer Center, please go directly to the Cancer Center and check in at the registration area.  Wear comfortable clothing and clothing appropriate for easy access to any Portacath or PICC line.   We strive to give you quality time with your provider. You may need to reschedule your appointment if you arrive late (15 or more minutes).  Arriving late affects you and other patients whose appointments are after yours.  Also, if you miss three or more appointments without notifying the office, you may be dismissed from the clinic at the provider's discretion.      For prescription refill requests, have your pharmacy contact our office and allow 72 hours for refills to be completed.    Today you received the following chemotherapy and/or immunotherapy agents- Keytruda      To help prevent nausea and vomiting after your treatment, we encourage you to take your nausea medication as directed.  BELOW ARE SYMPTOMS THAT SHOULD BE REPORTED IMMEDIATELY: *FEVER GREATER THAN 100.4 F (38 C) OR HIGHER *CHILLS OR SWEATING *NAUSEA AND VOMITING THAT IS NOT CONTROLLED WITH YOUR NAUSEA MEDICATION *UNUSUAL SHORTNESS OF BREATH *UNUSUAL BRUISING OR BLEEDING *URINARY PROBLEMS (pain or burning when urinating, or frequent urination) *BOWEL PROBLEMS (unusual diarrhea, constipation, pain near the anus) TENDERNESS IN MOUTH AND THROAT WITH OR WITHOUT PRESENCE OF ULCERS (sore throat, sores in mouth, or a toothache) UNUSUAL RASH, SWELLING OR PAIN  UNUSUAL VAGINAL DISCHARGE OR ITCHING   Items with * indicate a potential emergency and should be followed up as soon as possible or go to the Emergency Department if any problems should occur.  Please show the CHEMOTHERAPY ALERT CARD or IMMUNOTHERAPY ALERT CARD at check-in to  the Emergency Department and triage nurse.  Should you have questions after your visit or need to cancel or reschedule your appointment, please contact [facility]  [phone] and follow the prompts.  Office hours are 8:00 a.m. to 4:30 p.m. Monday - Friday. Please note that voicemails left after 4:00 p.m. may not be returned until the following business day.  We are closed weekends and major holidays. You have access to a nurse at all times for urgent questions. Please call the main number to the clinic [phone] and follow the prompts.  For any non-urgent questions, you may also contact your provider using MyChart. We now offer e-Visits for anyone 18 and older to request care online for non-urgent symptoms. For details visit [email or web address].   Also download the MyChart app! Go to the app store, search "MyChart", open the app, select Morgan Peterson, and log in with your MyChart username and password.   

## 2023-04-19 NOTE — Progress Notes (Signed)
[facility] CONSULT NOTE  Patient Care Team: Steele Sizer, MD as PCP - General (Family Medicine) Bary Castilla, Forest Gleason, MD (General Surgery) Carloyn Manner, MD as Referring Physician (Otolaryngology) Yolonda Kida, MD as Consulting Physician (Cardiology) Ree Edman, MD as Referring Physician (Dermatology) Lonia Farber, MD as Consulting Physician (Internal Medicine) Cammie Sickle, MD as Consulting Physician (Internal Medicine)  CHIEF COMPLAINTS/PURPOSE OF CONSULTATION: Breast cancer  #  Oncology History Overview Note  She has a remote history of right breast cancer, T1bN0, ER positive,s/p lumpectomy and MammoSite radiation and 5 years of Arimidex.    02/19/22 screening mammogram bilaterally showed  1. Suspicious right breast mass at the 10 o'clock position 8 cm from the nipple. Recommend ultrasound-guided biopsy. 2. Indeterminate right breast mass at the 6 o'clock position 4 cm from the nipple. Recommend ultrasound-guided biopsy. 3. No suspicious right axillary lymphadenopathy.   04/12/2022 Unilateral right breast diagnostic mammogram showed 1. Suspicious right breast mass at the 10 o'clock position 8 cm from the nipple. Recommend ultrasound-guided biopsy. 2. Indeterminate right breast mass at the 6 o'clock position 4 cm from the nipple. Recommend ultrasound-guided biopsy. 3. No suspicious right axillary lymphadenopathy.   04/13/2022 right breast mass 6 o'clock position 1 cm from the nipple showed invasive mammary carcinoma, no special type. Grade 3, DCIS present high grade, LVI not identified. ER-, PR 1-10%, HER 2 -   04/13/2022, right breast mass 10:00 7 cm from nipple biopsy showed invasive mammary carcinoma, grade 3, high-grade DCIS with focal comedonecrosis, lymphovascular invasion not identified.  ER -, PR 1 to 10%, HER2-    04/26/2022, right breast mastectomy with axillary dissection Invasive mammary carcinoma, no special type, multifocal, DCIS  high-grade, benign nipple/areola.  2 deposits of invasive mammary carcinoma, no definite residual lymph node identified.   Breast cancer ([place])  05/10/2022 Initial Diagnosis   Breast cancer ([place])   05/10/2022 Cancer Staging   Staging form: Breast, AJCC 8th Edition - Pathologic stage from 05/10/2022: Stage IIA (pT1c, pN1a, cM0, G3, ER-, PR+, HER2-) - Signed by Earlie Server, MD on 05/10/2022 Stage prefix: Initial diagnosis Histologic grading system: 3 grade system   05/24/2022 - 05/24/2022 Chemotherapy   Patient is on Treatment Plan : BREAST Pembrolizumab (200) D1 + Carboplatin (5) D1 + Paclitaxel (80) D1,8,15 q21d X 4 cycles / Pembrolizumab (200) D1 + AC D1 q21d x 4 cycles     05/27/2022 - 08/13/2022 Chemotherapy   Patient is on Treatment Plan : BREAST Pembrolizumab (200) D1 + Carboplatin (5) D1 + Paclitaxel (80) D1,8,15 q21d X 4 cycles / Pembrolizumab (200) D1 + AC D1 q21d x 4 cycles     05/27/2022 - 12/07/2022 Chemotherapy   Patient is on Treatment Plan : BREAST Pembrolizumab (200) D1 + Carboplatin (5) D1 + Paclitaxel (80) D1,8,15 q21d X 4 cycles / Pembrolizumab (200) D1 + AC D1 q21d x 4 cycles     01/04/2023 -  Chemotherapy   Patient is on Treatment Plan : BREAST Pembrolizumab (200) q21d x 27 weeks     Carcinoma of upper-outer quadrant of right breast in female, estrogen receptor negative ([place])  05/26/2022 Initial Diagnosis   Carcinoma of upper-outer quadrant of right breast in female, estrogen receptor negative ([place])   05/26/2022 Cancer Staging   Staging form: Breast, AJCC 8th Edition - Pathologic: Stage IIA (pT1c, pN1, cM0, G3, ER-, PR-, HER2-) - Signed by Cammie Sickle, MD on 05/26/2022 Multigene prognostic tests performed: None Histologic grading system: 3 grade system  05/27/2022 - 12/07/2022 Chemotherapy   Patient is on Treatment Plan : BREAST Pembrolizumab (200) D1 + Carboplatin (5) D1 + Paclitaxel (80) D1,8,15 q21d X 4 cycles / Pembrolizumab (200) D1 + AC D1 q21d x 4 cycles     01/04/2023  -  Chemotherapy   Patient is on Treatment Plan : BREAST Pembrolizumab (200) q21d x 27 weeks      HISTORY OF PRESENTING ILLNESS: Ambulating independently.  Alone.   Morgan Peterson 72 y.o.  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 Appetite is better.   Taking stool softeners for constipation.   Denies any nausea . No fever no chills.   Review of Systems  Constitutional:  Positive for malaise/fatigue. Negative for chills, diaphoresis, fever and weight loss.  HENT:  Negative for nosebleeds and sore throat.   Eyes:  Negative for double vision.  Respiratory:  Negative for cough, hemoptysis, sputum production, shortness of breath and wheezing.   Cardiovascular:  Negative for chest pain, palpitations, orthopnea and leg swelling.  Gastrointestinal:  Negative for abdominal pain, blood in stool, constipation, diarrhea, heartburn, melena, nausea and vomiting.  Genitourinary:  Negative for dysuria, frequency and urgency.  Musculoskeletal:  Negative for back pain and joint pain.  Skin: Negative.  Negative for itching and rash.  Neurological:  Positive for tingling. Negative for dizziness, focal weakness, weakness and headaches.  Endo/Heme/Allergies:  Does not bruise/bleed easily.  Psychiatric/Behavioral:  Negative for depression. The patient is not nervous/anxious and does not have insomnia.      MEDICAL HISTORY:  Past Medical History:  Diagnosis Date   Appetite loss    Asthma    Cervical radiculopathy    Chemotherapy-induced nausea    Hiatal hernia    Hypercholesterolemia    Hyperlipidemia    Hypertension    IBS (irritable bowel syndrome)    Malignant neoplasm of upper-outer quadrant of female breast (HCC) 04/11/2009   Right breast, invasive ductal carcinoma, 0.7 cm, low grade, T1b, N0, M0 ER 90%, PR 15%, HER-2/neu  1+ low Oncotype and recurrent score. Arimidex therapy completed September 2015   Mild mitral valve prolapse    Neuropathy    feet, from chemo   Personal history of malignant neoplasm of breast 2010   Personal history of radiation therapy 2010   mammosite   T2DM (type 2 diabetes mellitus) (HCC) 2011    SURGICAL HISTORY: Past Surgical History:  Procedure Laterality Date   ABDOMINAL EXPLORATION SURGERY  2000   ovarian cyst   BREAST BIOPSY Right 2010   +    BREAST BIOPSY Left 04/06/2023   Left Breast Stereo Bx X Clip - path pending   BREAST BIOPSY Left 04/06/2023   Left Breast Stereo Bx, Coil Clip - path pending   BREAST BIOPSY Left 04/06/2023   MM LT BREAST BX W LOC DEV 1ST LESION IMAGE BX SPEC STEREO GUIDE 04/06/2023 [facility]   BREAST BIOPSY Left 04/06/2023   MM LT BREAST BX W LOC DEV EA AD LESION IMG BX SPEC STEREO GUIDE 04/06/2023 [facility]   BREAST EXCISIONAL BIOPSY Right 2010   + mammo site inasive mammo ca   BREAST LUMPECTOMY Right 2010   [facility]   COLONOSCOPY  07/19/2012   Normal exam, Dr. Lemar Livings   COLONOSCOPY WITH PROPOFOL N/A 04/21/2022   Procedure: COLONOSCOPY WITH PROPOFOL;  Surgeon: Earline Mayotte, MD;  Location: [facility];  Service:  Endoscopy;  Laterality: N/A;   ESOPHAGOGASTRODUODENOSCOPY (EGD) WITH PROPOFOL N/A 10/22/2022   Procedure: ESOPHAGOGASTRODUODENOSCOPY (EGD) WITH PROPOFOL;  Surgeon: Midge Minium, MD;  Location: [facility];  Service: Endoscopy;  Laterality: N/A;  Diabetic   MASTECTOMY Right 2010   partial/ lumpectomy   PORTACATH PLACEMENT Left 05/19/2022   Procedure: INSERTION PORT-A-CATH;  Surgeon: Earline Mayotte, MD;  Location: [facility];  Service: General;  Laterality: Left;   SIMPLE MASTECTOMY WITH AXILLARY SENTINEL NODE BIOPSY Right 04/26/2022   Procedure: SIMPLE MASTECTOMY WITH AXILLARY SENTINEL NODE BIOPSY;  Surgeon: Earline Mayotte, MD;  Location: [facility];  Service: General;  Laterality: Right;  RNFA to assist    TUBAL LIGATION  20 years ago    SOCIAL HISTORY: Social History   Socioeconomic History   Marital status: Married    Spouse name: Antonio   Number of children: 2   Years of education: Not on file   Highest education level: Not on file  Occupational History   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  Tobacco Use   Smoking status: Former    Packs/day: 0.25    Years: 10.00    Additional pack years: 0.00    Total pack years: 2.50    Types: Cigarettes    Start date: 1978    Quit date: 1988    Years since quitting: 36.3   Smokeless tobacco: Never   Tobacco comments:    smoking cessation materials not required  Vaping Use   Vaping Use: Never used  Substance and Sexual Activity   Alcohol use: Yes    Comment: wine occ   Drug use: No   Sexual activity: Not Currently    Comment: husband has ED  Other Topics Concern   Not on file  Social History Narrative   Oncology nurse on the floor retired.;  Husband retired from [employer].  No smoking.   Social Determinants of Health   Financial Resource Strain: Low Risk  (04/07/2023)   Overall Financial Resource Strain (CARDIA)    Difficulty of Paying Living Expenses: Not hard at all  Food Insecurity: No Food Insecurity (04/07/2023)   Hunger Vital Sign    Worried About Running Out of Food in the Last Year: Never true    Ran Out of Food in the Last Year: Never true  Transportation Needs: No Transportation Needs (04/07/2023)   PRAPARE - Administrator, Civil Service (Medical): No    Lack of Transportation (Non-Medical): No  Physical Activity: Insufficiently Active (04/07/2023)   Exercise Vital Sign    Days of Exercise per Week: 1 day    Minutes of Exercise per Session: 20 min  Stress: No Stress Concern Present (04/07/2023)   [employer] of Occupational Health - Occupational Stress Questionnaire    Feeling of Stress : Not at all  Social Connections: Moderately Integrated (04/07/2023)   Social Connection and Isolation  Panel [NHANES]    Frequency of Communication with Friends and Family: More than three times a week    Frequency of Social Gatherings with Friends and Family: Three times a week    Attends Religious Services: More than 4 times per year    Active Member of Clubs or Organizations: No    Attends Banker Meetings: Never    Marital Status: Married  Catering manager Violence: Not At Risk (04/07/2023)   Humiliation, Afraid, Rape, and Kick questionnaire    Fear of Current or Ex-Partner: No    Emotionally Abused:  No    Physically Abused: No    Sexually Abused: No    FAMILY HISTORY: Family History  Problem Relation Age of Onset   Osteoporosis Mother    Diabetes Father    Kidney disease Father    Diabetes Brother    Breast cancer Maternal Aunt    Bladder Cancer Maternal Aunt    Cervical cancer Maternal Aunt    Colon cancer Maternal Uncle    Lung cancer Maternal Uncle     ALLERGIES:  is allergic to levaquin [levofloxacin in d5w], losartan, metoprolol, saxagliptin, amlodipine-olmesartan, canagliflozin, egg white [egg white (egg protein)], gabapentin, glipizide, gluten meal, lisinopril, metformin and related, milk (cow), milk-related compounds, shrimp extract, tramadol, zyprexa [olanzapine], actos [pioglitazone], atorvastatin, carvedilol, and prednisone.  MEDICATIONS:  Current Outpatient Medications  Medication Sig Dispense Refill   aspirin EC 81 MG tablet Take 1 tablet (81 mg total) by mouth daily. 30 tablet 0   atenolol (TENORMIN) 25 MG tablet Take 0.5 tablets (12.5 mg total) by mouth daily. 45 tablet 1   betamethasone, augmented, (DIPROLENE) 0.05 % lotion Apply 1 application  topically 2 (two) times daily.     Blood Glucose Monitoring Suppl (ONETOUCH VERIO) w/Device KIT 1 Device by Does not apply route once. 1 kit 0   cholecalciferol (VITAMIN D) 1000 UNITS tablet Take 1,000 Units by mouth daily.     Cyanocobalamin (VITAMIN B-12 PO) Take 1 tablet by mouth daily as needed  (fatigue).     EPINEPHrine (EPIPEN 2-PAK) 0.3 mg/0.3 mL IJ SOAJ injection Inject 0.3 mg into the muscle as needed for anaphylaxis. 2 each 0   famotidine (PEPCID) 20 MG tablet Take 1 tablet (20 mg total) by mouth 2 (two) times daily. 180 tablet 1   glucose blood (ONETOUCH VERIO) test strip Use as instructed 100 each 12   Lancets (ONETOUCH ULTRASOFT) lancets Use as instructed 100 each 3   levocetirizine (XYZAL) 5 MG tablet Take 1 tablet (5 mg total) by mouth every evening. 90 tablet 1   montelukast (SINGULAIR) 10 MG tablet TAKE 1 TABLET BY MOUTH EVERYDAY AT BEDTIME 90 tablet 1   Multiple Vitamin (MULTIVITAMIN WITH MINERALS) TABS tablet Take 1 tablet by mouth 2 (two) times a week.     OZEMPIC, 0.25 OR 0.5 MG/DOSE, 2 MG/1.5ML SOPN Inject 0.5 mg into the skin every Sunday.     rosuvastatin (CRESTOR) 10 MG tablet Take 1 tablet (10 mg total) by mouth every morning. 90 tablet 1   spironolactone (ALDACTONE) 50 MG tablet Take 1 tablet (50 mg total) by mouth daily. 90 tablet 1   No current facility-administered medications for this visit.   Facility-Administered Medications Ordered in Other Visits  Medication Dose Route Frequency Provider Last Rate Last Admin   heparin lock flush 100 unit/mL  500 Units Intravenous Once Louretta Shorten R, MD          .  PHYSICAL EXAMINATION: ECOG PERFORMANCE STATUS: 0 - Asymptomatic  Vitals:   04/19/23 1000  BP: (!) 120/59  Pulse: 84  Temp: (!) 97.2 F (36.2 C)  SpO2: 99%      Filed Weights   04/19/23 1000  Weight: 172 lb 12.8 oz (78.4 kg)       Physical Exam Vitals and nursing note reviewed.  HENT:     Head: Normocephalic and atraumatic.     Mouth/Throat:     Pharynx: Oropharynx is clear.  Eyes:     Extraocular Movements: Extraocular movements intact.     Pupils: Pupils are equal,  round, and reactive to light.  Cardiovascular:     Rate and Rhythm: Normal rate and regular rhythm.  Pulmonary:     Comments: Decreased breath sounds  bilaterally.  Abdominal:     Palpations: Abdomen is soft.  Musculoskeletal:        General: Normal range of motion.     Cervical back: Normal range of motion.  Skin:    General: Skin is warm.  Neurological:     General: No focal deficit present.     Mental Status: She is alert and oriented to person, place, and time.  Psychiatric:        Behavior: Behavior normal.        Judgment: Judgment normal.      LABORATORY DATA:  I have reviewed the data as listed Lab Results  Component Value Date   WBC 5.1 04/19/2023   HGB 10.3 (L) 04/19/2023   HCT 32.5 (L) 04/19/2023   MCV 93.4 04/19/2023   PLT 160 04/19/2023   Recent Labs    03/08/23 1243 03/29/23 0822 04/19/23 0956  NA 136 137 134*  K 4.1 4.1 3.8  CL 106 106 104  CO2 24 24 22   GLUCOSE 136* 100* 168*  BUN 17 15 13   CREATININE 0.85 0.95 0.92  CALCIUM 9.1 9.1 8.7*  GFRNONAA >60 >60 >60  PROT 7.2 7.2 6.7  ALBUMIN 3.6 3.6 3.4*  AST 24 20 24   ALT 17 17 14   ALKPHOS 79 79 72  BILITOT 0.5 0.6 0.4    RADIOGRAPHIC STUDIES: I have personally reviewed the radiological images as listed and agreed with the findings in the report. MM LT BREAST BX W LOC DEV 1ST LESION IMAGE BX SPEC STEREO GUIDE  Addendum Date: 04/12/2023   ADDENDUM REPORT: 04/12/2023 07:48 ADDENDUM: PATHOLOGY revealed: Site A. BREAST, LEFT UPPER OUTER QUADRANT, POSTERIOR DEPTH; STEREOTACTIC CORE NEEDLE BIOPSY: - BENIGN MAMMARY PARENCHYMA WITH FIBROADENOMATOID CHANGES AND ASSOCIATED COARSE DYSTROPHIC CALCIFICATIONS. - NEGATIVE FOR ATYPICAL PROLIFERATIVE BREAST DISEASE. Pathology results are CONCORDANT with imaging findings, per Dr. Sande Brothers. PATHOLOGY revealed: Site B. BREAST, LEFT CENTRAL, MIDDLE DEPTH; STEREOTACTIC CORE NEEDLE BIOPSY: - BENIGN MAMMARY PARENCHYMA WITH FIBROADENOMATOID CHANGES AND ASSOCIATED COARSE DYSTROPHIC CALCIFICATIONS. - BACKGROUND FIBROCYSTIC CHANGES AND FOCAL USUAL DUCTAL HYPERPLASIA. - NEGATIVE FOR ATYPICAL PROLIFERATIVE BREAST DISEASE.  Pathology results are CONCORDANT with imaging findings, per Dr. Sande Brothers. Pathology results and recommendations below were discussed with patient by telephone on 04/07/2023. Patient reported biopsy site within normal limits with slight tenderness at the site. Post biopsy care instructions were reviewed, questions were answered and my direct phone number was provided to patient. Patient was instructed to call [facility] if any concerns or questions arise related to the biopsy. RECOMMENDATION: Patient instructed to continue monthly self breast examinations and resume annual bilateral diagnostic mammogram due to recent history or breast cancer. Due March 2025. Pathology results reported by Randa Lynn RN on 04/07/2023. Electronically Signed   By: Sande Brothers M.D.   On: 04/12/2023 07:48   Result Date: 04/12/2023 CLINICAL DATA:  Two groups of indeterminate left breast calcifications. EXAM: LEFT BREAST STEREOTACTIC CORE NEEDLE BIOPSY x2 COMPARISON:  Previous exam(s). FINDINGS: The patient and I discussed the procedure of stereotactic-guided biopsy including benefits and alternatives. We discussed the high likelihood of a successful procedure. We discussed the risks of the procedure including infection, bleeding, tissue injury, clip migration, and inadequate sampling. Informed written consent was given. The usual time out protocol was performed immediately prior to the procedure. Lesion  quadrant: Upper outer quadrant Using sterile technique and 1% Lidocaine as local anesthetic, under stereotactic guidance, a 9 gauge vacuum assisted device was used to perform core needle biopsy of calcifications in the upper-outer quadrant of the left breast using a lateral approach. Specimen radiograph was performed showing numerous calcifications within the specimen. Specimens with calcifications are identified for pathology. At the conclusion of the procedure, an X shaped tissue marker clip was deployed into the biopsy  cavity. Lesion quadrant: Upper outer quadrant Using sterile technique and 1% Lidocaine as local anesthetic, under stereotactic guidance, a 9 gauge vacuum assisted device was used to perform core needle biopsy of calcifications in the central left breast at mid depth using a superior approach. Specimen radiograph was performed showing several calcifications within the specimen. Specimens with calcifications are identified for pathology. At the conclusion of the procedure, a coil shaped tissue marker clip was deployed into the biopsy cavity. Follow-up 2-view mammogram was performed and dictated separately. IMPRESSION: Stereotactic-guided biopsy of the left breast x2. No apparent complications. Electronically Signed: By: Sande Brothers M.D. On: 04/06/2023 10:14   MM LT BREAST BX W LOC DEV EA AD LESION IMG BX SPEC STEREO GUIDE  Addendum Date: 04/12/2023   ADDENDUM REPORT: 04/12/2023 07:48 ADDENDUM: PATHOLOGY revealed: Site A. BREAST, LEFT UPPER OUTER QUADRANT, POSTERIOR DEPTH; STEREOTACTIC CORE NEEDLE BIOPSY: - BENIGN MAMMARY PARENCHYMA WITH FIBROADENOMATOID CHANGES AND ASSOCIATED COARSE DYSTROPHIC CALCIFICATIONS. - NEGATIVE FOR ATYPICAL PROLIFERATIVE BREAST DISEASE. Pathology results are CONCORDANT with imaging findings, per Dr. Sande Brothers. PATHOLOGY revealed: Site B. BREAST, LEFT CENTRAL, MIDDLE DEPTH; STEREOTACTIC CORE NEEDLE BIOPSY: - BENIGN MAMMARY PARENCHYMA WITH FIBROADENOMATOID CHANGES AND ASSOCIATED COARSE DYSTROPHIC CALCIFICATIONS. - BACKGROUND FIBROCYSTIC CHANGES AND FOCAL USUAL DUCTAL HYPERPLASIA. - NEGATIVE FOR ATYPICAL PROLIFERATIVE BREAST DISEASE. Pathology results are CONCORDANT with imaging findings, per Dr. Sande Brothers. Pathology results and recommendations below were discussed with patient by telephone on 04/07/2023. Patient reported biopsy site within normal limits with slight tenderness at the site. Post biopsy care instructions were reviewed, questions were answered and my direct phone  number was provided to patient. Patient was instructed to call [facility] if any concerns or questions arise related to the biopsy. RECOMMENDATION: Patient instructed to continue monthly self breast examinations and resume annual bilateral diagnostic mammogram due to recent history or breast cancer. Due March 2025. Pathology results reported by Randa Lynn RN on 04/07/2023. Electronically Signed   By: Sande Brothers M.D.   On: 04/12/2023 07:48   Result Date: 04/12/2023 CLINICAL DATA:  Two groups of indeterminate left breast calcifications. EXAM: LEFT BREAST STEREOTACTIC CORE NEEDLE BIOPSY x2 COMPARISON:  Previous exam(s). FINDINGS: The patient and I discussed the procedure of stereotactic-guided biopsy including benefits and alternatives. We discussed the high likelihood of a successful procedure. We discussed the risks of the procedure including infection, bleeding, tissue injury, clip migration, and inadequate sampling. Informed written consent was given. The usual time out protocol was performed immediately prior to the procedure. Lesion quadrant: Upper outer quadrant Using sterile technique and 1% Lidocaine as local anesthetic, under stereotactic guidance, a 9 gauge vacuum assisted device was used to perform core needle biopsy of calcifications in the upper-outer quadrant of the left breast using a lateral approach. Specimen radiograph was performed showing numerous calcifications within the specimen. Specimens with calcifications are identified for pathology. At the conclusion of the procedure, an X shaped tissue marker clip was deployed into the biopsy cavity. Lesion quadrant: Upper outer quadrant Using sterile technique and 1% Lidocaine as local  anesthetic, under stereotactic guidance, a 9 gauge vacuum assisted device was used to perform core needle biopsy of calcifications in the central left breast at mid depth using a superior approach. Specimen radiograph was performed showing several  calcifications within the specimen. Specimens with calcifications are identified for pathology. At the conclusion of the procedure, a coil shaped tissue marker clip was deployed into the biopsy cavity. Follow-up 2-view mammogram was performed and dictated separately. IMPRESSION: Stereotactic-guided biopsy of the left breast x2. No apparent complications. Electronically Signed: By: Sande Brothers M.D. On: 04/06/2023 10:14   MM CLIP PLACEMENT LEFT  Result Date: 04/06/2023 CLINICAL DATA:  Status post left breast biopsy x2. EXAM: 3D DIAGNOSTIC LEFT MAMMOGRAM POST STEREOTACTIC BIOPSY COMPARISON:  Previous exam(s). FINDINGS: 3D Mammographic images were obtained following stereotactic guided biopsy of the left breast x2. The biopsy marking clips are in the expected positions. IMPRESSION: Appropriate positioning of both post biopsy marking clips. Final Assessment: Post Procedure Mammograms for Marker Placement Electronically Signed   By: Sande Brothers M.D.   On: 04/06/2023 10:15  MM 3D DIAGNOSTIC MAMMOGRAM UNILATERAL LEFT BREAST  Result Date: 03/25/2023 CLINICAL DATA:  Screening recall for possible left breast calcifications. Of note, she is status post right mastectomy in 2023 for invasive ductal carcinoma. EXAM: DIGITAL DIAGNOSTIC UNILATERAL LEFT MAMMOGRAM WITH TOMOSYNTHESIS TECHNIQUE: Left digital diagnostic mammography and breast tomosynthesis was performed. COMPARISON:  Previous exam(s). ACR Breast Density Category b: There are scattered areas of fibroglandular density. FINDINGS: Spot magnification views demonstrate a 10 mm group of coarse heterogeneous calcification in the upper-outer left breast posterior depth. This corresponds with the screening mammogram finding and is not definitely stable compared to prior exams. Incidentally visualized additional 10 mm group of coarse heterogeneous calcification in the upper central left breast middle depth. This is also not definitely stable compared to prior exams. No  additional suspicious mass, calcification, or other findings are identified in the left breast. IMPRESSION: There are two separate 10 mm groups of coarse heterogeneous calcification in the left breast that are low suspicion for malignancy. Recommend further assessment with stereotactic guided biopsy. RECOMMENDATION: Left breast stereotactic guided biopsy (2 site). I have discussed the findings and recommendations with the patient. The biopsy procedure was discussed with the patient and questions were answered. Patient expressed their understanding of the biopsy recommendation. Patient will be scheduled for biopsy at her earliest convenience by the schedulers. Ordering provider will be notified. If applicable, a reminder letter will be sent to the patient regarding the next appointment. BI-RADS CATEGORY  4: Suspicious. Electronically Signed   By: Jacob Peterson M.D.   On: 03/25/2023 17:02  MM 3D SCREEN BREAST UNI LEFT  Result Date: 03/23/2023 CLINICAL DATA:  Screening. Interval right mastectomy. EXAM: DIGITAL SCREENING UNILATERAL LEFT MAMMOGRAM WITH CAD AND TOMOSYNTHESIS TECHNIQUE: Left screening digital craniocaudal and mediolateral oblique mammograms were obtained. Left screening digital breast tomosynthesis was performed. The images were evaluated with computer-aided detection. COMPARISON:  Previous exam(s). ACR Breast Density Category b: There are scattered areas of fibroglandular density. FINDINGS: In the left breast, calcifications warrant further evaluation. In the right breast, no findings suspicious for malignancy. IMPRESSION: Further evaluation is suggested for calcifications in the left breast. RECOMMENDATION: Diagnostic mammogram of the left breast. (Code:[id number]) The patient will be contacted regarding the findings, and additional imaging will be scheduled. BI-RADS CATEGORY  0: Incomplete: Need additional imaging evaluation. Electronically Signed   By: Edwin Cap M.D.   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  Patient currently on adjuvant chemoimmunotherapy [KEYNOTE 522 study]; patient s/p carboplatin weekly Taxol.  Currently on Adriamycin-Cytoxan Keytruda-s/p 4 cycles [cycle #4 reduced to Adriamycin to 40 mg/m2].   # proceed with adjuvant Keytruda #7-  out of planned 9 cycles. Labs today reviewed;  acceptable for treatment today. FEB 2024- TSH- WNL.   #left breast- mammogram-  There are two separate 10 mm groups of coarse heterogeneous calcification in the left breast that are low suspicion for malignancy. Awaiting stereotactic guided biopsy.  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    # Hypomagnesemia magnesium NOV 2023- 1.7 continue mag oxide to twice a day.  vit D Sep 40. Continue ca+vit D.Stable.   # DM- on  ozempic.  Monitor blood sugars closely.Stable.  # Hypokalemia- improved; ok to stop Kdur. Stable.  # DISPOSITION: # keytruda today # follow up in 3 weeks- MD; port-labs- cbc/cmp; Keytruda- Dr.B  All questions were answered. The patient/family knows to call the clinic with any problems, questions or concerns.    Earna Coder, MD 04/19/2023 10:36 AM

## 2023-04-19 NOTE — Assessment & Plan Note (Addendum)
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  Patient currently on adjuvant chemoimmunotherapy [KEYNOTE 522 study]; patient s/p carboplatin weekly Taxol.  Currently on Adriamycin-Cytoxan Keytruda-s/p 4 cycles [cycle #4 reduced to Adriamycin to 40 mg/m2].   # proceed with adjuvant Keytruda #6  out of planned 9 cycles. Labs today reviewed;  acceptable for treatment today. FEB 2024- TSH- WNL.  stable  #left breast- mammogram-  There are two separate 10 mm groups of coarse heterogeneous calcification in the left breast that are low suspicion for malignancy. S/p  stereotactic guided biopsy- negative. Stable.   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   # Hypomagnesemia magnesium NOV 2023- 1.7 continue mag oxide to twice a day.  vit D Sep 40. Continue ca+vit D.Stable.   # DM- on  ozempic.  Monitor blood sugars closely.Stable.   # Hypokalemia- improved; ok to stop Kdur. Stable.   # DISPOSITION: # keytruda today # follow up in 3 weeks- MD; port-labs- cbc/cmp; Keytruda- Dr.B

## 2023-04-19 NOTE — Progress Notes (Signed)
No concerns today 

## 2023-04-19 NOTE — Progress Notes (Signed)
Nutrition Follow-up:  Add on to clinic schedule.  Patient with triple negative breast cancer.  Patient receiving [place].    Patient with questions for RD regarding drinking herbal tea that daughter bought her.  ([employer] [place]).  Appetite is good    Medications: reviewed  Labs: reviewed  Anthropometrics:   Weight 172 lb 12.8 oz   Stable weight for the last few months   NUTRITION DIAGNOSIS: Inadequate oral intake improved    INTERVENTION:  Questions answered regarding herbal teas Patient has RD contact information    NEXT VISIT: no follow-up RD available as needed  Sadia Belfiore B. Freida Busman, RD, LDN Registered Dietitian [phone]

## 2023-04-21 LAB — T4: T4, Total: 5.9 ug/dL (ref 4.5–12.0)

## 2023-05-10 ENCOUNTER — Inpatient Hospital Stay: Payer: Medicare Other

## 2023-05-10 ENCOUNTER — Encounter: Payer: Self-pay | Admitting: Internal Medicine

## 2023-05-10 ENCOUNTER — Inpatient Hospital Stay: Payer: Medicare Other | Attending: Oncology | Admitting: Internal Medicine

## 2023-05-10 VITALS — BP 128/73 | HR 72 | Temp 97.6°F | Resp 20 | Wt 169.6 lb

## 2023-05-10 DIAGNOSIS — Z7982 Long term (current) use of aspirin: Secondary | ICD-10-CM | POA: Diagnosis not present

## 2023-05-10 DIAGNOSIS — Z171 Estrogen receptor negative status [ER-]: Secondary | ICD-10-CM

## 2023-05-10 DIAGNOSIS — C50811 Malignant neoplasm of overlapping sites of right female breast: Secondary | ICD-10-CM

## 2023-05-10 DIAGNOSIS — E876 Hypokalemia: Secondary | ICD-10-CM | POA: Diagnosis not present

## 2023-05-10 DIAGNOSIS — Z801 Family history of malignant neoplasm of trachea, bronchus and lung: Secondary | ICD-10-CM | POA: Insufficient documentation

## 2023-05-10 DIAGNOSIS — Z87891 Personal history of nicotine dependence: Secondary | ICD-10-CM | POA: Insufficient documentation

## 2023-05-10 DIAGNOSIS — Z8049 Family history of malignant neoplasm of other genital organs: Secondary | ICD-10-CM | POA: Insufficient documentation

## 2023-05-10 DIAGNOSIS — Z803 Family history of malignant neoplasm of breast: Secondary | ICD-10-CM | POA: Insufficient documentation

## 2023-05-10 DIAGNOSIS — Z800 Family history of malignant neoplasm of digestive organs: Secondary | ICD-10-CM | POA: Insufficient documentation

## 2023-05-10 DIAGNOSIS — C50411 Malignant neoplasm of upper-outer quadrant of right female breast: Secondary | ICD-10-CM | POA: Insufficient documentation

## 2023-05-10 DIAGNOSIS — Z79899 Other long term (current) drug therapy: Secondary | ICD-10-CM | POA: Insufficient documentation

## 2023-05-10 DIAGNOSIS — G629 Polyneuropathy, unspecified: Secondary | ICD-10-CM | POA: Diagnosis not present

## 2023-05-10 DIAGNOSIS — Z8052 Family history of malignant neoplasm of bladder: Secondary | ICD-10-CM | POA: Insufficient documentation

## 2023-05-10 DIAGNOSIS — E119 Type 2 diabetes mellitus without complications: Secondary | ICD-10-CM | POA: Diagnosis not present

## 2023-05-10 DIAGNOSIS — Z5112 Encounter for antineoplastic immunotherapy: Secondary | ICD-10-CM | POA: Diagnosis not present

## 2023-05-10 DIAGNOSIS — Z9011 Acquired absence of right breast and nipple: Secondary | ICD-10-CM | POA: Diagnosis not present

## 2023-05-10 LAB — CBC WITH DIFFERENTIAL/PLATELET
Abs Immature Granulocytes: 0.03 10*3/uL (ref 0.00–0.07)
Basophils Absolute: 0 10*3/uL (ref 0.0–0.1)
Basophils Relative: 0 %
Eosinophils Absolute: 0.1 10*3/uL (ref 0.0–0.5)
Eosinophils Relative: 2 %
HCT: 36 % (ref 36.0–46.0)
Hemoglobin: 11.2 g/dL — ABNORMAL LOW (ref 12.0–15.0)
Immature Granulocytes: 1 %
Lymphocytes Relative: 21 %
Lymphs Abs: 1.1 10*3/uL (ref 0.7–4.0)
MCH: 28.6 pg (ref 26.0–34.0)
MCHC: 31.1 g/dL (ref 30.0–36.0)
MCV: 91.8 fL (ref 80.0–100.0)
Monocytes Absolute: 0.3 10*3/uL (ref 0.1–1.0)
Monocytes Relative: 6 %
Neutro Abs: 3.8 10*3/uL (ref 1.7–7.7)
Neutrophils Relative %: 70 %
Platelets: 198 10*3/uL (ref 150–400)
RBC: 3.92 MIL/uL (ref 3.87–5.11)
RDW: 14.2 % (ref 11.5–15.5)
WBC: 5.3 10*3/uL (ref 4.0–10.5)
nRBC: 0 % (ref 0.0–0.2)

## 2023-05-10 LAB — COMPREHENSIVE METABOLIC PANEL
ALT: 14 U/L (ref 0–44)
AST: 20 U/L (ref 15–41)
Albumin: 3.7 g/dL (ref 3.5–5.0)
Alkaline Phosphatase: 86 U/L (ref 38–126)
Anion gap: 9 (ref 5–15)
BUN: 16 mg/dL (ref 8–23)
CO2: 26 mmol/L (ref 22–32)
Calcium: 9.3 mg/dL (ref 8.9–10.3)
Chloride: 101 mmol/L (ref 98–111)
Creatinine, Ser: 0.94 mg/dL (ref 0.44–1.00)
GFR, Estimated: >60 mL/min (ref >60)
Glucose, Bld: 92 mg/dL (ref 70–99)
Potassium: 4 mmol/L (ref 3.5–5.1)
Sodium: 136 mmol/L (ref 135–145)
Total Bilirubin: 0.5 mg/dL (ref 0.3–1.2)
Total Protein: 7.4 g/dL (ref 6.5–8.1)

## 2023-05-10 MED ORDER — SODIUM CHLORIDE 0.9 % IV SOLN
Freq: Once | INTRAVENOUS | Status: AC
  Filled 2023-05-10: qty 250

## 2023-05-10 MED ORDER — SODIUM CHLORIDE 0.9 % IV SOLN
200.0000 mg | Freq: Once | INTRAVENOUS | Status: AC
  Administered 2023-05-10: 200 mg via INTRAVENOUS
  Filled 2023-05-10: qty 200

## 2023-05-10 MED ORDER — SODIUM CHLORIDE 0.9% FLUSH
10.0000 mL | INTRAVENOUS | Status: DC | PRN
  Administered 2023-05-10: 10 mL via INTRAVENOUS
  Filled 2023-05-10: qty 10

## 2023-05-10 MED ORDER — HEPARIN SOD (PORK) LOCK FLUSH 100 UNIT/ML IV SOLN
500.0000 [IU] | Freq: Once | INTRAVENOUS | Status: AC | PRN
  Administered 2023-05-10: 500 [IU]
  Filled 2023-05-10: qty 5

## 2023-05-10 NOTE — Progress Notes (Signed)
Patient has no concerns 

## 2023-05-10 NOTE — Progress Notes (Signed)
[facility] CONSULT NOTE  Patient Care Team: Alba Cory, MD as PCP - General (Family Medicine) Lemar Livings, Merrily Pew, MD (General Surgery) Bud Face, MD as Referring Physician (Otolaryngology) Alwyn Pea, MD as Consulting Physician (Cardiology) Jesusita Oka, MD as Referring Physician (Dermatology) Sherlon Handing, MD as Consulting Physician (Internal Medicine) Earna Coder, MD as Consulting Physician (Internal Medicine)  CHIEF COMPLAINTS/PURPOSE OF CONSULTATION: Breast cancer  #  Oncology History Overview Note  She has a remote history of right breast cancer, T1bN0, ER positive,s/p lumpectomy and MammoSite radiation and 5 years of Arimidex.    02/19/22 screening mammogram bilaterally showed  1. Suspicious right breast mass at the 10 o'clock position 8 cm from the nipple. Recommend ultrasound-guided biopsy. 2. Indeterminate right breast mass at the 6 o'clock position 4 cm from the nipple. Recommend ultrasound-guided biopsy. 3. No suspicious right axillary lymphadenopathy.   04/12/2022 Unilateral right breast diagnostic mammogram showed 1. Suspicious right breast mass at the 10 o'clock position 8 cm from the nipple. Recommend ultrasound-guided biopsy. 2. Indeterminate right breast mass at the 6 o'clock position 4 cm from the nipple. Recommend ultrasound-guided biopsy. 3. No suspicious right axillary lymphadenopathy.   04/13/2022 right breast mass 6 o'clock position 1 cm from the nipple showed invasive mammary carcinoma, no special type. Grade 3, DCIS present high grade, LVI not identified. ER-, PR 1-10%, HER 2 -   04/13/2022, right breast mass 10:00 7 cm from nipple biopsy showed invasive mammary carcinoma, grade 3, high-grade DCIS with focal comedonecrosis, lymphovascular invasion not identified.  ER -, PR 1 to 10%, HER2-    04/26/2022, right breast mastectomy with axillary dissection Invasive mammary carcinoma, no special type, multifocal, DCIS  high-grade, benign nipple/areola.  2 deposits of invasive mammary carcinoma, no definite residual lymph node identified.   Breast cancer (HCC)  05/10/2022 Initial Diagnosis   Breast cancer (HCC)   05/10/2022 Cancer Staging   Staging form: Breast, AJCC 8th Edition - Pathologic stage from 05/10/2022: Stage IIA (pT1c, pN1a, cM0, G3, ER-, PR+, HER2-) - Signed by Rickard Patience, MD on 05/10/2022 Stage prefix: Initial diagnosis Histologic grading system: 3 grade system   05/24/2022 - 05/24/2022 Chemotherapy   Patient is on Treatment Plan : BREAST Pembrolizumab (200) D1 + Carboplatin (5) D1 + Paclitaxel (80) D1,8,15 q21d X 4 cycles / Pembrolizumab (200) D1 + AC D1 q21d x 4 cycles     05/27/2022 - 08/13/2022 Chemotherapy   Patient is on Treatment Plan : BREAST Pembrolizumab (200) D1 + Carboplatin (5) D1 + Paclitaxel (80) D1,8,15 q21d X 4 cycles / Pembrolizumab (200) D1 + AC D1 q21d x 4 cycles     05/27/2022 - 12/07/2022 Chemotherapy   Patient is on Treatment Plan : BREAST Pembrolizumab (200) D1 + Carboplatin (5) D1 + Paclitaxel (80) D1,8,15 q21d X 4 cycles / Pembrolizumab (200) D1 + AC D1 q21d x 4 cycles     01/04/2023 -  Chemotherapy   Patient is on Treatment Plan : BREAST Pembrolizumab (200) q21d x 27 weeks     Carcinoma of upper-outer quadrant of right breast in female, estrogen receptor negative (HCC)  05/26/2022 Initial Diagnosis   Carcinoma of upper-outer quadrant of right breast in female, estrogen receptor negative (HCC)   05/26/2022 Cancer Staging   Staging form: Breast, AJCC 8th Edition - Pathologic: Stage IIA (pT1c, pN1, cM0, G3, ER-, PR-, HER2-) - Signed by Earna Coder, MD on 05/26/2022 Multigene prognostic tests performed: None Histologic grading system: 3 grade system  05/27/2022 - 12/07/2022 Chemotherapy   Patient is on Treatment Plan : BREAST Pembrolizumab (200) D1 + Carboplatin (5) D1 + Paclitaxel (80) D1,8,15 q21d X 4 cycles / Pembrolizumab (200) D1 + AC D1 q21d x 4 cycles     01/04/2023  -  Chemotherapy   Patient is on Treatment Plan : BREAST Pembrolizumab (200) q21d x 27 weeks      HISTORY OF PRESENTING ILLNESS: Ambulating independently.  Alone.   Morgan Peterson 72 y.o.  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 Appetite is better.   Taking stool softeners for constipation.   Denies any nausea . No fever no chills.   Review of Systems  Constitutional:  Positive for malaise/fatigue. Negative for chills, diaphoresis, fever and weight loss.  HENT:  Negative for nosebleeds and sore throat.   Eyes:  Negative for double vision.  Respiratory:  Negative for cough, hemoptysis, sputum production, shortness of breath and wheezing.   Cardiovascular:  Negative for chest pain, palpitations, orthopnea and leg swelling.  Gastrointestinal:  Negative for abdominal pain, blood in stool, constipation, diarrhea, heartburn, melena, nausea and vomiting.  Genitourinary:  Negative for dysuria, frequency and urgency.  Musculoskeletal:  Negative for back pain and joint pain.  Skin: Negative.  Negative for itching and rash.  Neurological:  Positive for tingling. Negative for dizziness, focal weakness, weakness and headaches.  Endo/Heme/Allergies:  Does not bruise/bleed easily.  Psychiatric/Behavioral:  Negative for depression. The patient is not nervous/anxious and does not have insomnia.      MEDICAL HISTORY:  Past Medical History:  Diagnosis Date   Appetite loss    Asthma    Cervical radiculopathy    Chemotherapy-induced nausea    Hiatal hernia    Hypercholesterolemia    Hyperlipidemia    Hypertension    IBS (irritable bowel syndrome)    Malignant neoplasm of upper-outer quadrant of female breast (HCC) 04/11/2009   Right breast, invasive ductal carcinoma, 0.7 cm, low grade, T1b, N0, M0 ER 90%, PR 15%, HER-2/neu  1+ low Oncotype and recurrent score. Arimidex therapy completed September 2015   Mild mitral valve prolapse    Neuropathy    feet, from chemo   Personal history of malignant neoplasm of breast 2010   Personal history of radiation therapy 2010   mammosite   T2DM (type 2 diabetes mellitus) (HCC) 2011    SURGICAL HISTORY: Past Surgical History:  Procedure Laterality Date   ABDOMINAL EXPLORATION SURGERY  2000   ovarian cyst   BREAST BIOPSY Right 2010   +    BREAST BIOPSY Left 04/06/2023   Left Breast Stereo Bx X Clip - path pending   BREAST BIOPSY Left 04/06/2023   Left Breast Stereo Bx, Coil Clip - path pending   BREAST BIOPSY Left 04/06/2023   MM LT BREAST BX W LOC DEV 1ST LESION IMAGE BX SPEC STEREO GUIDE 04/06/2023 [facility]   BREAST BIOPSY Left 04/06/2023   MM LT BREAST BX W LOC DEV EA AD LESION IMG BX SPEC STEREO GUIDE 04/06/2023 [facility]   BREAST EXCISIONAL BIOPSY Right 2010   + mammo site inasive mammo ca   BREAST LUMPECTOMY Right 2010   [facility]   COLONOSCOPY  07/19/2012   Normal exam, Dr. Lemar Livings   COLONOSCOPY WITH PROPOFOL N/A 04/21/2022   Procedure: COLONOSCOPY WITH PROPOFOL;  Surgeon: Earline Mayotte, MD;  Location: [facility];  Service:  Endoscopy;  Laterality: N/A;   ESOPHAGOGASTRODUODENOSCOPY (EGD) WITH PROPOFOL N/A 10/22/2022   Procedure: ESOPHAGOGASTRODUODENOSCOPY (EGD) WITH PROPOFOL;  Surgeon: Midge Minium, MD;  Location: [facility];  Service: Endoscopy;  Laterality: N/A;  Diabetic   MASTECTOMY Right 2010   partial/ lumpectomy   PORTACATH PLACEMENT Left 05/19/2022   Procedure: INSERTION PORT-A-CATH;  Surgeon: Earline Mayotte, MD;  Location: [facility];  Service: General;  Laterality: Left;   SIMPLE MASTECTOMY WITH AXILLARY SENTINEL NODE BIOPSY Right 04/26/2022   Procedure: SIMPLE MASTECTOMY WITH AXILLARY SENTINEL NODE BIOPSY;  Surgeon: Earline Mayotte, MD;  Location: [facility];  Service: General;  Laterality: Right;  RNFA to assist    TUBAL LIGATION  20 years ago    SOCIAL HISTORY: Social History   Socioeconomic History   Marital status: Married    Spouse name: Antonio   Number of children: 2   Years of education: Not on file   Highest education level: Not on file  Occupational History   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  Tobacco Use   Smoking status: Former    Packs/day: 0.25    Years: 10.00    Additional pack years: 0.00    Total pack years: 2.50    Types: Cigarettes    Start date: 1978    Quit date: 1988    Years since quitting: 36.4   Smokeless tobacco: Never   Tobacco comments:    smoking cessation materials not required  Vaping Use   Vaping Use: Never used  Substance and Sexual Activity   Alcohol use: Yes    Comment: wine occ   Drug use: No   Sexual activity: Not Currently    Comment: husband has ED  Other Topics Concern   Not on file  Social History Narrative   Oncology nurse on the floor retired.;  Husband retired from [employer].  No smoking.   Social Determinants of Health   Financial Resource Strain: Low Risk  (04/07/2023)   Overall Financial Resource Strain (CARDIA)    Difficulty of Paying Living Expenses: Not hard at all  Food Insecurity: No Food Insecurity (04/07/2023)   Hunger Vital Sign    Worried About Running Out of Food in the Last Year: Never true    Ran Out of Food in the Last Year: Never true  Transportation Needs: No Transportation Needs (04/07/2023)   PRAPARE - Administrator, Civil Service (Medical): No    Lack of Transportation (Non-Medical): No  Physical Activity: Insufficiently Active (04/07/2023)   Exercise Vital Sign    Days of Exercise per Week: 1 day    Minutes of Exercise per Session: 20 min  Stress: No Stress Concern Present (04/07/2023)   [employer] of Occupational Health - Occupational Stress Questionnaire    Feeling of Stress : Not at all  Social Connections: Moderately Integrated (04/07/2023)   Social Connection and Isolation  Panel [NHANES]    Frequency of Communication with Friends and Family: More than three times a week    Frequency of Social Gatherings with Friends and Family: Three times a week    Attends Religious Services: More than 4 times per year    Active Member of Clubs or Organizations: No    Attends Banker Meetings: Never    Marital Status: Married  Catering manager Violence: Not At Risk (04/07/2023)   Humiliation, Afraid, Rape, and Kick questionnaire    Fear of Current or Ex-Partner: No    Emotionally Abused:  No    Physically Abused: No    Sexually Abused: No    FAMILY HISTORY: Family History  Problem Relation Age of Onset   Osteoporosis Mother    Diabetes Father    Kidney disease Father    Diabetes Brother    Breast cancer Maternal Aunt    Bladder Cancer Maternal Aunt    Cervical cancer Maternal Aunt    Colon cancer Maternal Uncle    Lung cancer Maternal Uncle     ALLERGIES:  is allergic to levaquin [levofloxacin in d5w], losartan, metoprolol, saxagliptin, amlodipine-olmesartan, canagliflozin, egg white [egg white (egg protein)], gabapentin, glipizide, gluten meal, lisinopril, metformin and related, milk (cow), milk-related compounds, shrimp extract, tramadol, zyprexa [olanzapine], actos [pioglitazone], atorvastatin, carvedilol, and prednisone.  MEDICATIONS:  Current Outpatient Medications  Medication Sig Dispense Refill   aspirin EC 81 MG tablet Take 1 tablet (81 mg total) by mouth daily. 30 tablet 0   atenolol (TENORMIN) 25 MG tablet Take 0.5 tablets (12.5 mg total) by mouth daily. 45 tablet 1   betamethasone, augmented, (DIPROLENE) 0.05 % lotion Apply 1 application  topically 2 (two) times daily.     Blood Glucose Monitoring Suppl (ONETOUCH VERIO) w/Device KIT 1 Device by Does not apply route once. 1 kit 0   cholecalciferol (VITAMIN D) 1000 UNITS tablet Take 1,000 Units by mouth daily.     Cyanocobalamin (VITAMIN B-12 PO) Take 1 tablet by mouth daily as needed  (fatigue).     EPINEPHrine (EPIPEN 2-PAK) 0.3 mg/0.3 mL IJ SOAJ injection Inject 0.3 mg into the muscle as needed for anaphylaxis. 2 each 0   famotidine (PEPCID) 20 MG tablet Take 1 tablet (20 mg total) by mouth 2 (two) times daily. 180 tablet 1   glucose blood (ONETOUCH VERIO) test strip Use as instructed 100 each 12   Lancets (ONETOUCH ULTRASOFT) lancets Use as instructed 100 each 3   levocetirizine (XYZAL) 5 MG tablet Take 1 tablet (5 mg total) by mouth every evening. 90 tablet 1   montelukast (SINGULAIR) 10 MG tablet TAKE 1 TABLET BY MOUTH EVERYDAY AT BEDTIME 90 tablet 1   Multiple Vitamin (MULTIVITAMIN WITH MINERALS) TABS tablet Take 1 tablet by mouth 2 (two) times a week.     OZEMPIC, 0.25 OR 0.5 MG/DOSE, 2 MG/1.5ML SOPN Inject 0.5 mg into the skin every Sunday.     rosuvastatin (CRESTOR) 10 MG tablet Take 1 tablet (10 mg total) by mouth every morning. 90 tablet 1   spironolactone (ALDACTONE) 50 MG tablet Take 1 tablet (50 mg total) by mouth daily. 90 tablet 1   No current facility-administered medications for this visit.   Facility-Administered Medications Ordered in Other Visits  Medication Dose Route Frequency Provider Last Rate Last Admin   heparin lock flush 100 unit/mL  500 Units Intravenous Once Louretta Shorten R, MD       sodium chloride flush (NS) 0.9 % injection 10 mL  10 mL Intravenous PRN Earna Coder, MD   10 mL at 05/10/23 0935      .  PHYSICAL EXAMINATION: ECOG PERFORMANCE STATUS: 0 - Asymptomatic  Vitals:   05/10/23 0946  BP: 128/73  Pulse: 72  Resp: 20  Temp: 97.6 F (36.4 C)  SpO2: 100%      Filed Weights   05/10/23 0946  Weight: 169 lb 9.6 oz (76.9 kg)       Physical Exam Vitals and nursing note reviewed.  HENT:     Head: Normocephalic and atraumatic.  Mouth/Throat:     Pharynx: Oropharynx is clear.  Eyes:     Extraocular Movements: Extraocular movements intact.     Pupils: Pupils are equal, round, and reactive to light.   Cardiovascular:     Rate and Rhythm: Normal rate and regular rhythm.  Pulmonary:     Comments: Decreased breath sounds bilaterally.  Abdominal:     Palpations: Abdomen is soft.  Musculoskeletal:        General: Normal range of motion.     Cervical back: Normal range of motion.  Skin:    General: Skin is warm.  Neurological:     General: No focal deficit present.     Mental Status: She is alert and oriented to person, place, and time.  Psychiatric:        Behavior: Behavior normal.        Judgment: Judgment normal.      LABORATORY DATA:  I have reviewed the data as listed Lab Results  Component Value Date   WBC 5.3 05/10/2023   HGB 11.2 (L) 05/10/2023   HCT 36.0 05/10/2023   MCV 91.8 05/10/2023   PLT 198 05/10/2023   Recent Labs    03/08/23 1243 03/29/23 0822 04/19/23 0956  NA 136 137 134*  K 4.1 4.1 3.8  CL 106 106 104  CO2 24 24 22   GLUCOSE 136* 100* 168*  BUN 17 15 13   CREATININE 0.85 0.95 0.92  CALCIUM 9.1 9.1 8.7*  GFRNONAA >60 >60 >60  PROT 7.2 7.2 6.7  ALBUMIN 3.6 3.6 3.4*  AST 24 20 24   ALT 17 17 14   ALKPHOS 79 79 72  BILITOT 0.5 0.6 0.4    RADIOGRAPHIC STUDIES: I have personally reviewed the radiological images as listed and agreed with the findings in the report. No results found.  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  Patient currently on adjuvant chemoimmunotherapy [KEYNOTE 522 study]; patient s/p carboplatin weekly Taxol.  Currently on Adriamycin-Cytoxan Keytruda-s/p 4 cycles [cycle #4 reduced to Adriamycin to 40 mg/m2].   # proceed with adjuvant Keytruda #7  out of planned 9 cycles. Labs today reviewed;  acceptable for treatment today. FEB 2024- TSH- WNL.  stable  #left breast- mammogram-  There are two separate 10 mm groups of coarse heterogeneous calcification in the left breast that  are low suspicion for malignancy. S/p  stereotactic guided biopsy- negative. 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 Sep 2023- vit D  40. Continue ca+vit D.stable  # DM- on  ozempic.  Monitor blood sugars closely.stable  # Hypokalemia- improved; ok to stop Kdur. stable  # DISPOSITION: # keytruda today # follow up in 3 weeks- MD; port-labs- cbc/cmp; Keytruda- Dr.B  All questions were answered. The patient/family knows to call the clinic with any problems, questions or concerns.    Earna Coder, MD 05/10/2023 10:18 AM

## 2023-05-10 NOTE — Assessment & Plan Note (Addendum)
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  Patient currently on adjuvant chemoimmunotherapy [KEYNOTE 522 study]; patient s/p carboplatin weekly Taxol.  Currently on Adriamycin-Cytoxan Keytruda-s/p 4 cycles [cycle #4 reduced to Adriamycin to 40 mg/m2].   # proceed with adjuvant Keytruda #7  out of planned 9 cycles. Labs today reviewed;  acceptable for treatment today. FEB 2024- TSH- WNL.  stable  #left breast- mammogram-  There are two separate 10 mm groups of coarse heterogeneous calcification in the left breast that are low suspicion for malignancy. S/p  stereotactic guided biopsy- negative. 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 Sep 2023- vit D  40. Continue ca+vit D.stable  # DM- on  ozempic.  Monitor blood sugars closely.stable  # Hypokalemia- improved; ok to stop Kdur. stable  # DISPOSITION: # keytruda today # follow up in 3 weeks- MD; port-labs- cbc/cmp; Keytruda- Dr.B

## 2023-05-10 NOTE — Patient Instructions (Signed)
[facility]  Discharge Instructions: Thank you for choosing [facility] to provide your oncology and hematology care.  If you have a lab appointment with the Cancer Center, please go directly to the Cancer Center and check in at the registration area.  Wear comfortable clothing and clothing appropriate for easy access to any Portacath or PICC line.   We strive to give you quality time with your provider. You may need to reschedule your appointment if you arrive late (15 or more minutes).  Arriving late affects you and other patients whose appointments are after yours.  Also, if you miss three or more appointments without notifying the office, you may be dismissed from the clinic at the provider's discretion.      For prescription refill requests, have your pharmacy contact our office and allow 72 hours for refills to be completed.    Today you received the following chemotherapy and/or immunotherapy agents- Keytruda      To help prevent nausea and vomiting after your treatment, we encourage you to take your nausea medication as directed.  BELOW ARE SYMPTOMS THAT SHOULD BE REPORTED IMMEDIATELY: *FEVER GREATER THAN 100.4 F (38 C) OR HIGHER *CHILLS OR SWEATING *NAUSEA AND VOMITING THAT IS NOT CONTROLLED WITH YOUR NAUSEA MEDICATION *UNUSUAL SHORTNESS OF BREATH *UNUSUAL BRUISING OR BLEEDING *URINARY PROBLEMS (pain or burning when urinating, or frequent urination) *BOWEL PROBLEMS (unusual diarrhea, constipation, pain near the anus) TENDERNESS IN MOUTH AND THROAT WITH OR WITHOUT PRESENCE OF ULCERS (sore throat, sores in mouth, or a toothache) UNUSUAL RASH, SWELLING OR PAIN  UNUSUAL VAGINAL DISCHARGE OR ITCHING   Items with * indicate a potential emergency and should be followed up as soon as possible or go to the Emergency Department if any problems should occur.  Please show the CHEMOTHERAPY ALERT CARD or IMMUNOTHERAPY ALERT CARD at check-in to  the Emergency Department and triage nurse.  Should you have questions after your visit or need to cancel or reschedule your appointment, please contact [facility]  [phone] and follow the prompts.  Office hours are 8:00 a.m. to 4:30 p.m. Monday - Friday. Please note that voicemails left after 4:00 p.m. may not be returned until the following business day.  We are closed weekends and major holidays. You have access to a nurse at all times for urgent questions. Please call the main number to the clinic [phone] and follow the prompts.  For any non-urgent questions, you may also contact your provider using MyChart. We now offer e-Visits for anyone 18 and older to request care online for non-urgent symptoms. For details visit [email or web address].   Also download the MyChart app! Go to the app store, search "MyChart", open the app, select [facility], and log in with your MyChart username and password.   

## 2023-05-11 ENCOUNTER — Inpatient Hospital Stay: Payer: Medicare Other | Admitting: Occupational Therapy

## 2023-05-11 ENCOUNTER — Other Ambulatory Visit: Payer: Self-pay | Admitting: Pharmacist

## 2023-05-11 NOTE — Progress Notes (Signed)
   05/11/2023  Patient ID: Morgan Peterson, female   DOB: [birth year], 71 y.o.   MRN: [id number]  Pharmacy Quality Measure Review  This patient is appearing on the insurance-provided list related to adherence measure for diabetes medications this calendar year.   Medication: Ozempic 0.5 mg Last fill date: 05/01/2023 for 28 day supply  Outreach to patient by telephone today. Patient denies any barriers to obtaining her Ozempic. Based on dispensing history in chart, no gaps noted.  Plan: No follow up needed   Estelle Grumbles, PharmD, [facility] [phone]

## 2023-05-12 ENCOUNTER — Encounter: Payer: Self-pay | Admitting: Internal Medicine

## 2023-05-16 DIAGNOSIS — C50911 Malignant neoplasm of unspecified site of right female breast: Secondary | ICD-10-CM | POA: Diagnosis not present

## 2023-05-18 ENCOUNTER — Inpatient Hospital Stay: Payer: Medicare Other | Admitting: Occupational Therapy

## 2023-05-18 DIAGNOSIS — Z171 Estrogen receptor negative status [ER-]: Secondary | ICD-10-CM

## 2023-05-18 NOTE — Therapy (Signed)
[facility] [address] [place], [place], [address] Phone: [phone]   Fax:  [phone]  Occupational Therapy Screen  Patient Details  Name: Morgan Peterson MRN: [id number] Date of Birth: 25-Jun-1951 No data recorded  Encounter Date: 05/18/2023   OT End of Session - 05/18/23 1315     Visit Number 0             Past Medical History:  Diagnosis Date   Appetite loss    Asthma    Cervical radiculopathy    Chemotherapy-induced nausea    Hiatal hernia    Hypercholesterolemia    Hyperlipidemia    Hypertension    IBS (irritable bowel syndrome)    Malignant neoplasm of upper-outer quadrant of female breast (HCC) 04/11/2009   Right breast, invasive ductal carcinoma, 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 Arimidex therapy completed September 2015   Mild mitral valve prolapse    Neuropathy    feet, from chemo   Personal history of malignant neoplasm of breast 2010   Personal history of radiation therapy 2010   mammosite   T2DM (type 2 diabetes mellitus) (HCC) 2011    Past Surgical History:  Procedure Laterality Date   ABDOMINAL EXPLORATION SURGERY  2000   ovarian cyst   BREAST BIOPSY Right 2010   +    BREAST BIOPSY Left 04/06/2023   Left Breast Stereo Bx X Clip - path pending   BREAST BIOPSY Left 04/06/2023   Left Breast Stereo Bx, Coil Clip - path pending   BREAST BIOPSY Left 04/06/2023   MM LT BREAST BX W LOC DEV 1ST LESION IMAGE BX SPEC STEREO GUIDE 04/06/2023 [facility]   BREAST BIOPSY Left 04/06/2023   MM LT BREAST BX W LOC DEV EA AD LESION IMG BX SPEC STEREO GUIDE 04/06/2023 [facility]   BREAST EXCISIONAL BIOPSY Right 2010   + mammo site inasive mammo ca   BREAST LUMPECTOMY Right 2010   [facility]   COLONOSCOPY  07/19/2012   Normal exam, Dr. Lemar Livings   COLONOSCOPY WITH PROPOFOL N/A 04/21/2022   Procedure: COLONOSCOPY WITH PROPOFOL;   Surgeon: Earline Mayotte, MD;  Location: [facility];  Service: Endoscopy;  Laterality: N/A;   ESOPHAGOGASTRODUODENOSCOPY (EGD) WITH PROPOFOL N/A 10/22/2022   Procedure: ESOPHAGOGASTRODUODENOSCOPY (EGD) WITH PROPOFOL;  Surgeon: Midge Minium, MD;  Location: [facility];  Service: Endoscopy;  Laterality: N/A;  Diabetic   MASTECTOMY Right 2010   partial/ lumpectomy   PORTACATH PLACEMENT Left 05/19/2022   Procedure: INSERTION PORT-A-CATH;  Surgeon: Earline Mayotte, MD;  Location: [facility];  Service: General;  Laterality: Left;   SIMPLE MASTECTOMY WITH AXILLARY SENTINEL NODE BIOPSY Right 04/26/2022   Procedure: SIMPLE MASTECTOMY WITH AXILLARY SENTINEL NODE BIOPSY;  Surgeon: Earline Mayotte, MD;  Location: [facility];  Service: General;  Laterality: Right;  RNFA to assist   TUBAL LIGATION  20 years ago    There were no vitals filed for this visit.   Subjective Assessment - 05/18/23 1313     Subjective  Since have been fitted with bras last year I last like 20 to 30 pounds.  But started feeling some tightness and discomfort in my right upper arm when lying down or working on the computer.  She just measured me for new bras but did not get it yet.  Do not know if it is the bra that bothers my armpit and  arm or if I need a compression sleeve.    Currently in Pain? No/denies                 LYMPHEDEMA/ONCOLOGY QUESTIONNAIRE - 05/18/23 0001       Right Upper Extremity Lymphedema   15 cm Proximal to Olecranon Process 34.5 cm    10 cm Proximal to Olecranon Process 30.5 cm    Olecranon Process 25.5 cm    15 cm Proximal to Ulnar Styloid Process 23.3 cm    Just Proximal to Ulnar Styloid Process 20 cm      Left Upper Extremity Lymphedema   15 cm Proximal to Olecranon Process 33.5 cm    10 cm Proximal to Olecranon Process 30.8 cm    Olecranon Process 24.5 cm    15 cm Proximal to Ulnar Styloid Process 23 cm    10 cm Proximal to Ulnar Styloid Process 19.5 cm    Just Proximal  to Ulnar Styloid Process 15.8 cm             Dr Tonna Boehringer  NOTE 04/04/23: Assessment:  Malignant neoplasm of upper-outer quadrant of right female breast, unspecified estrogen receptor status (CMS/HHS-HCC) [C50.411] T post right mastectomy on 04-26-22 and post port placement on 05-19-22 for T1c, N1A triple negative cancer.   Right arm lymphedema  Plan:   1. Malignant neoplasm of upper-outer quadrant of right female breast, unspecified estrogen receptor status (CMS/HHS-HCC) [C50.411]  Now with imaging as noted above. Will f/u with bx results when available.  Right side lymphedema with persistent tightness around axilla. Recommend repeat OT eval, possible sleeve.     OT SCREEN 05/18/23:  Patient right breast cancer patient. Had a right mastectomy on 04/26/2022  No radiation  Was seen 6/23 with no symptoms of lymphedema and low risk Refer for follow up today  Patient reports she lost since last year 20 to 30 pounds. Her Talitha Givens are  bothering her more in her right axilla and under her arm. Had at times increased tightness and discomfort in the right upper arm when using it like working on the computer.  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 She is right-hand dominant. Patient was measured a few days ago for new bras. Patient with bilateral circumference compared to last year and now  -shows no signs of symptoms of lymphedema. Patient to wear her new bras for a week or 2 and follow-up again with OT to assess if she continues to have some symptoms in right upper arm. And she needs a preventative compression sleeve for right upper extremity with high risk activities.                           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   Bilateral lower extremity edema 08/27/2022   Hives 08/27/2022   Food allergy 08/05/2022   Atherosclerosis of both carotid arteries 08/05/2022   Migraine aura without headache 08/05/2022   Carcinoma of upper-outer quadrant of right breast in female, estrogen receptor negative (HCC) 05/26/2022   Breast cancer (HCC) 05/10/2022   Goals of care, counseling/discussion 05/10/2022   Encounter for monitoring cardiotoxic drug therapy 05/10/2022   Carotid stenosis 04/21/2022  Hypertension associated with type 2 diabetes mellitus (HCC) 01/20/2021   Intermittent low back pain 08/18/2017   Irritable bowel syndrome with constipation 05/02/2017   Vitamin D deficiency 09/09/2016   Hiatal hernia 06/02/2016   Allergic rhinitis, seasonal 06/02/2016   Obesity (BMI 30-39.9) 04/30/2016   Angio-edema 04/30/2016   Hyperlipidemia 11/04/2015   Hyperlipidemia due to type 2 diabetes mellitus (HCC) 11/04/2015   Cervical radiculopathy at C6 07/31/2015   Neuroma digital nerve 07/31/2015   Dyslipidemia associated with type 2 diabetes mellitus (HCC) 06/30/2015   Cervical disc disease 06/30/2015   Essential hypertension 06/30/2015   History of breast cancer in female 03/23/2009    Oletta Cohn, OTR/L,CLT 05/18/2023, 1:18 PM  [facility] [address], [address] [place], [place], [address] Phone: [phone]   Fax:  [phone]  Name: KIMA DASINGER MRN: [id number] Date of Birth: 07/04/1951

## 2023-05-20 DIAGNOSIS — R5382 Chronic fatigue, unspecified: Secondary | ICD-10-CM | POA: Diagnosis not present

## 2023-05-20 DIAGNOSIS — E119 Type 2 diabetes mellitus without complications: Secondary | ICD-10-CM | POA: Diagnosis not present

## 2023-05-20 DIAGNOSIS — E782 Mixed hyperlipidemia: Secondary | ICD-10-CM | POA: Diagnosis not present

## 2023-05-20 DIAGNOSIS — Z9221 Personal history of antineoplastic chemotherapy: Secondary | ICD-10-CM | POA: Diagnosis not present

## 2023-05-20 DIAGNOSIS — R001 Bradycardia, unspecified: Secondary | ICD-10-CM | POA: Diagnosis not present

## 2023-05-20 DIAGNOSIS — I341 Nonrheumatic mitral (valve) prolapse: Secondary | ICD-10-CM | POA: Diagnosis not present

## 2023-05-20 DIAGNOSIS — I499 Cardiac arrhythmia, unspecified: Secondary | ICD-10-CM | POA: Diagnosis not present

## 2023-05-20 DIAGNOSIS — I10 Essential (primary) hypertension: Secondary | ICD-10-CM | POA: Diagnosis not present

## 2023-05-20 DIAGNOSIS — R002 Palpitations: Secondary | ICD-10-CM | POA: Diagnosis not present

## 2023-05-25 DIAGNOSIS — H2513 Age-related nuclear cataract, bilateral: Secondary | ICD-10-CM | POA: Diagnosis not present

## 2023-05-25 DIAGNOSIS — H1045 Other chronic allergic conjunctivitis: Secondary | ICD-10-CM | POA: Diagnosis not present

## 2023-05-25 DIAGNOSIS — C50911 Malignant neoplasm of unspecified site of right female breast: Secondary | ICD-10-CM | POA: Diagnosis not present

## 2023-05-25 DIAGNOSIS — H5213 Myopia, bilateral: Secondary | ICD-10-CM | POA: Diagnosis not present

## 2023-05-25 DIAGNOSIS — E119 Type 2 diabetes mellitus without complications: Secondary | ICD-10-CM | POA: Diagnosis not present

## 2023-05-25 LAB — HM DIABETES EYE EXAM

## 2023-05-31 ENCOUNTER — Inpatient Hospital Stay: Payer: Medicare Other

## 2023-05-31 ENCOUNTER — Inpatient Hospital Stay: Payer: Medicare Other | Attending: Oncology

## 2023-05-31 ENCOUNTER — Encounter: Payer: Self-pay | Admitting: Internal Medicine

## 2023-05-31 ENCOUNTER — Inpatient Hospital Stay (HOSPITAL_BASED_OUTPATIENT_CLINIC_OR_DEPARTMENT_OTHER): Payer: Medicare Other | Admitting: Internal Medicine

## 2023-05-31 VITALS — BP 136/74 | HR 79 | Temp 97.7°F | Ht 66.0 in | Wt 173.6 lb

## 2023-05-31 DIAGNOSIS — E119 Type 2 diabetes mellitus without complications: Secondary | ICD-10-CM | POA: Diagnosis not present

## 2023-05-31 DIAGNOSIS — Z9011 Acquired absence of right breast and nipple: Secondary | ICD-10-CM | POA: Insufficient documentation

## 2023-05-31 DIAGNOSIS — C50411 Malignant neoplasm of upper-outer quadrant of right female breast: Secondary | ICD-10-CM

## 2023-05-31 DIAGNOSIS — Z171 Estrogen receptor negative status [ER-]: Secondary | ICD-10-CM | POA: Diagnosis not present

## 2023-05-31 DIAGNOSIS — Z87891 Personal history of nicotine dependence: Secondary | ICD-10-CM | POA: Diagnosis not present

## 2023-05-31 DIAGNOSIS — E876 Hypokalemia: Secondary | ICD-10-CM | POA: Diagnosis not present

## 2023-05-31 DIAGNOSIS — Z79899 Other long term (current) drug therapy: Secondary | ICD-10-CM | POA: Insufficient documentation

## 2023-05-31 DIAGNOSIS — C50811 Malignant neoplasm of overlapping sites of right female breast: Secondary | ICD-10-CM | POA: Diagnosis not present

## 2023-05-31 DIAGNOSIS — Z5112 Encounter for antineoplastic immunotherapy: Secondary | ICD-10-CM | POA: Diagnosis not present

## 2023-05-31 DIAGNOSIS — Z7982 Long term (current) use of aspirin: Secondary | ICD-10-CM | POA: Diagnosis not present

## 2023-05-31 LAB — CBC WITH DIFFERENTIAL/PLATELET
Abs Immature Granulocytes: 0.02 10*3/uL (ref 0.00–0.07)
Basophils Absolute: 0 10*3/uL (ref 0.0–0.1)
Basophils Relative: 1 %
Eosinophils Absolute: 0.1 10*3/uL (ref 0.0–0.5)
Eosinophils Relative: 2 %
HCT: 36 % (ref 36.0–46.0)
Hemoglobin: 11.3 g/dL — ABNORMAL LOW (ref 12.0–15.0)
Immature Granulocytes: 1 %
Lymphocytes Relative: 21 %
Lymphs Abs: 0.9 10*3/uL (ref 0.7–4.0)
MCH: 28.8 pg (ref 26.0–34.0)
MCHC: 31.4 g/dL (ref 30.0–36.0)
MCV: 91.6 fL (ref 80.0–100.0)
Monocytes Absolute: 0.3 10*3/uL (ref 0.1–1.0)
Monocytes Relative: 7 %
Neutro Abs: 3.1 10*3/uL (ref 1.7–7.7)
Neutrophils Relative %: 68 %
Platelets: 177 10*3/uL (ref 150–400)
RBC: 3.93 MIL/uL (ref 3.87–5.11)
RDW: 14.4 % (ref 11.5–15.5)
WBC: 4.4 10*3/uL (ref 4.0–10.5)
nRBC: 0 % (ref 0.0–0.2)

## 2023-05-31 LAB — COMPREHENSIVE METABOLIC PANEL
ALT: 15 U/L (ref 0–44)
AST: 16 U/L (ref 15–41)
Albumin: 3.8 g/dL (ref 3.5–5.0)
Alkaline Phosphatase: 84 U/L (ref 38–126)
Anion gap: 10 (ref 5–15)
BUN: 16 mg/dL (ref 8–23)
CO2: 24 mmol/L (ref 22–32)
Calcium: 9.3 mg/dL (ref 8.9–10.3)
Chloride: 106 mmol/L (ref 98–111)
Creatinine, Ser: 0.94 mg/dL (ref 0.44–1.00)
GFR, Estimated: >60 mL/min (ref >60)
Glucose, Bld: 110 mg/dL — ABNORMAL HIGH (ref 70–99)
Potassium: 4.1 mmol/L (ref 3.5–5.1)
Sodium: 140 mmol/L (ref 135–145)
Total Bilirubin: 0.5 mg/dL (ref 0.3–1.2)
Total Protein: 7.4 g/dL (ref 6.5–8.1)

## 2023-05-31 MED ORDER — HEPARIN SOD (PORK) LOCK FLUSH 100 UNIT/ML IV SOLN
500.0000 [IU] | Freq: Once | INTRAVENOUS | Status: AC | PRN
  Administered 2023-05-31: 500 [IU]
  Filled 2023-05-31: qty 5

## 2023-05-31 MED ORDER — SODIUM CHLORIDE 0.9 % IV SOLN
Freq: Once | INTRAVENOUS | Status: AC
  Filled 2023-05-31: qty 250

## 2023-05-31 MED ORDER — HEPARIN SOD (PORK) LOCK FLUSH 100 UNIT/ML IV SOLN
INTRAVENOUS | Status: AC
  Filled 2023-05-31: qty 5

## 2023-05-31 MED ORDER — SODIUM CHLORIDE 0.9 % IV SOLN
200.0000 mg | Freq: Once | INTRAVENOUS | Status: AC
  Administered 2023-05-31: 200 mg via INTRAVENOUS
  Filled 2023-05-31: qty 200

## 2023-05-31 NOTE — Progress Notes (Signed)
C/o constipation, has been using otc but not helping much. Can you rx something?

## 2023-05-31 NOTE — Assessment & Plan Note (Addendum)
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  Patient currently on adjuvant chemoimmunotherapy [KEYNOTE 522 study]; patient s/p carboplatin weekly Taxol.  Currently on Adriamycin-Cytoxan Keytruda-s/p 4 cycles [cycle #4 reduced to Adriamycin to 40 mg/m2].   # proceed with adjuvant Keytruda #8  out of planned 9 cycles. Labs today reviewed;  acceptable for treatment today. FEB 2024- TSH- WNL. Will follow up in 3 months after last treatment.   #left breast- mammogram-  [Dr.Sakai] There are two separate 10 mm groups of coarse heterogeneous calcification in the left breast that are low suspicion for malignancy. S/p  stereotactic guided biopsy- negative.  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 Sep 2023- vit D  40. Continue ca+vit D. stable  # DM- on  ozempic.  Monitor blood sugars closely. stable  # Hypokalemia- improved; ok to stop Kdur.  Stable   # port/IV access- Stable; discussed re: pro and cons of keeping the port vs. Explantation. Ok to explant after next/last treatment.   # DISPOSITION: # refer to Dr.Sakai re: port explantation-  # keytruda today # follow up in 3 weeks- APP; port-labs- cbc/cmp; Keytruda- Dr.B

## 2023-05-31 NOTE — Patient Instructions (Signed)
[facility]  Discharge Instructions: Thank you for choosing [facility] to provide your oncology and hematology care.  If you have a lab appointment with the Cancer Center, please go directly to the Cancer Center and check in at the registration area.  Wear comfortable clothing and clothing appropriate for easy access to any Portacath or PICC line.   We strive to give you quality time with your provider. You may need to reschedule your appointment if you arrive late (15 or more minutes).  Arriving late affects you and other patients whose appointments are after yours.  Also, if you miss three or more appointments without notifying the office, you may be dismissed from the clinic at the provider's discretion.      For prescription refill requests, have your pharmacy contact our office and allow 72 hours for refills to be completed.    Today you received the following chemotherapy and/or immunotherapy agents- keytruda      To help prevent nausea and vomiting after your treatment, we encourage you to take your nausea medication as directed.  BELOW ARE SYMPTOMS THAT SHOULD BE REPORTED IMMEDIATELY: *FEVER GREATER THAN 100.4 F (38 C) OR HIGHER *CHILLS OR SWEATING *NAUSEA AND VOMITING THAT IS NOT CONTROLLED WITH YOUR NAUSEA MEDICATION *UNUSUAL SHORTNESS OF BREATH *UNUSUAL BRUISING OR BLEEDING *URINARY PROBLEMS (pain or burning when urinating, or frequent urination) *BOWEL PROBLEMS (unusual diarrhea, constipation, pain near the anus) TENDERNESS IN MOUTH AND THROAT WITH OR WITHOUT PRESENCE OF ULCERS (sore throat, sores in mouth, or a toothache) UNUSUAL RASH, SWELLING OR PAIN  UNUSUAL VAGINAL DISCHARGE OR ITCHING   Items with * indicate a potential emergency and should be followed up as soon as possible or go to the Emergency Department if any problems should occur.  Please show the CHEMOTHERAPY ALERT CARD or IMMUNOTHERAPY ALERT CARD at check-in to  the Emergency Department and triage nurse.  Should you have questions after your visit or need to cancel or reschedule your appointment, please contact [facility] [place] [facility]  [phone] and follow the prompts.  Office hours are 8:00 a.m. to 4:30 p.m. Monday - Friday. Please note that voicemails left after 4:00 p.m. may not be returned until the following business day.  We are closed weekends and major holidays. You have access to a nurse at all times for urgent questions. Please call the main number to the clinic [phone] and follow the prompts.  For any non-urgent questions, you may also contact your provider using MyChart. We now offer e-Visits for anyone 18 and older to request care online for non-urgent symptoms. For details visit [email or web address].   Also download the MyChart app! Go to the app store, search "MyChart", open the app, select [facility], and log in with your MyChart username and password.    

## 2023-05-31 NOTE — Patient Instructions (Signed)

## 2023-05-31 NOTE — Progress Notes (Signed)
[facility] CONSULT NOTE  Patient Care Team: Steele Sizer, MD as PCP - General (Family Medicine) Bary Castilla, Forest Gleason, MD (General Surgery) Carloyn Manner, MD as Referring Physician (Otolaryngology) Yolonda Kida, MD as Consulting Physician (Cardiology) Ree Edman, MD as Referring Physician (Dermatology) Lonia Farber, MD as Consulting Physician (Internal Medicine) Cammie Sickle, MD as Consulting Physician (Internal Medicine)  CHIEF COMPLAINTS/PURPOSE OF CONSULTATION: Breast cancer  #  Oncology History Overview Note  She has a remote history of right breast cancer, T1bN0, ER positive,s/p lumpectomy and MammoSite radiation and 5 years of Arimidex.    02/19/22 screening mammogram bilaterally showed  1. Suspicious right breast mass at the 10 o'clock position 8 cm from the nipple. Recommend ultrasound-guided biopsy. 2. Indeterminate right breast mass at the 6 o'clock position 4 cm from the nipple. Recommend ultrasound-guided biopsy. 3. No suspicious right axillary lymphadenopathy.   04/12/2022 Unilateral right breast diagnostic mammogram showed 1. Suspicious right breast mass at the 10 o'clock position 8 cm from the nipple. Recommend ultrasound-guided biopsy. 2. Indeterminate right breast mass at the 6 o'clock position 4 cm from the nipple. Recommend ultrasound-guided biopsy. 3. No suspicious right axillary lymphadenopathy.   04/13/2022 right breast mass 6 o'clock position 1 cm from the nipple showed invasive mammary carcinoma, no special type. Grade 3, DCIS present high grade, LVI not identified. ER-, PR 1-10%, HER 2 -   04/13/2022, right breast mass 10:00 7 cm from nipple biopsy showed invasive mammary carcinoma, grade 3, high-grade DCIS with focal comedonecrosis, lymphovascular invasion not identified.  ER -, PR 1 to 10%, HER2-    04/26/2022, right breast mastectomy with axillary dissection Invasive mammary carcinoma, no special type, multifocal, DCIS  high-grade, benign nipple/areola.  2 deposits of invasive mammary carcinoma, no definite residual lymph node identified.   Breast cancer ([place])  05/10/2022 Initial Diagnosis   Breast cancer ([place])   05/10/2022 Cancer Staging   Staging form: Breast, AJCC 8th Edition - Pathologic stage from 05/10/2022: Stage IIA (pT1c, pN1a, cM0, G3, ER-, PR+, HER2-) - Signed by Earlie Server, MD on 05/10/2022 Stage prefix: Initial diagnosis Histologic grading system: 3 grade system   05/24/2022 - 05/24/2022 Chemotherapy   Patient is on Treatment Plan : BREAST Pembrolizumab (200) D1 + Carboplatin (5) D1 + Paclitaxel (80) D1,8,15 q21d X 4 cycles / Pembrolizumab (200) D1 + AC D1 q21d x 4 cycles     05/27/2022 - 08/13/2022 Chemotherapy   Patient is on Treatment Plan : BREAST Pembrolizumab (200) D1 + Carboplatin (5) D1 + Paclitaxel (80) D1,8,15 q21d X 4 cycles / Pembrolizumab (200) D1 + AC D1 q21d x 4 cycles     05/27/2022 - 12/07/2022 Chemotherapy   Patient is on Treatment Plan : BREAST Pembrolizumab (200) D1 + Carboplatin (5) D1 + Paclitaxel (80) D1,8,15 q21d X 4 cycles / Pembrolizumab (200) D1 + AC D1 q21d x 4 cycles     01/04/2023 -  Chemotherapy   Patient is on Treatment Plan : BREAST Pembrolizumab (200) q21d x 27 weeks     Carcinoma of upper-outer quadrant of right breast in female, estrogen receptor negative ([place])  05/26/2022 Initial Diagnosis   Carcinoma of upper-outer quadrant of right breast in female, estrogen receptor negative ([place])   05/26/2022 Cancer Staging   Staging form: Breast, AJCC 8th Edition - Pathologic: Stage IIA (pT1c, pN1, cM0, G3, ER-, PR-, HER2-) - Signed by Cammie Sickle, MD on 05/26/2022 Multigene prognostic tests performed: None Histologic grading system: 3 grade system  05/27/2022 - 12/07/2022 Chemotherapy   Patient is on Treatment Plan : BREAST Pembrolizumab (200) D1 + Carboplatin (5) D1 + Paclitaxel (80) D1,8,15 q21d X 4 cycles / Pembrolizumab (200) D1 + AC D1 q21d x 4 cycles     01/04/2023  -  Chemotherapy   Patient is on Treatment Plan : BREAST Pembrolizumab (200) q21d x 27 weeks      HISTORY OF PRESENTING ILLNESS: Ambulating independently.  Alone.   Morgan Peterson 72 y.o.  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   C/o constipation, has been using otc- metamucil/lmiralax but not helping much.  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 Appetite is better.    Denies any nausea . No fever no chills.   Review of Systems  Constitutional:  Positive for malaise/fatigue. Negative for chills, diaphoresis, fever and weight loss.  HENT:  Negative for nosebleeds and sore throat.   Eyes:  Negative for double vision.  Respiratory:  Negative for cough, hemoptysis, sputum production, shortness of breath and wheezing.   Cardiovascular:  Negative for chest pain, palpitations, orthopnea and leg swelling.  Gastrointestinal:  Negative for abdominal pain, blood in stool, constipation, diarrhea, heartburn, melena, nausea and vomiting.  Genitourinary:  Negative for dysuria, frequency and urgency.  Musculoskeletal:  Negative for back pain and joint pain.  Skin: Negative.  Negative for itching and rash.  Neurological:  Positive for tingling. Negative for dizziness, focal weakness, weakness and headaches.  Endo/Heme/Allergies:  Does not bruise/bleed easily.  Psychiatric/Behavioral:  Negative for depression. The patient is not nervous/anxious and does not have insomnia.      MEDICAL HISTORY:  Past Medical History:  Diagnosis Date   Appetite loss    Asthma    Cervical radiculopathy    Chemotherapy-induced nausea    Hiatal hernia    Hypercholesterolemia    Hyperlipidemia    Hypertension    IBS (irritable bowel syndrome)    Malignant neoplasm of upper-outer quadrant of female breast (HCC) 04/11/2009   Right breast, invasive ductal carcinoma, 0.7 cm, low grade,  T1b, N0, M0 ER 90%, PR 15%, HER-2/neu 1+ low Oncotype and recurrent score. Arimidex therapy completed September 2015   Mild mitral valve prolapse    Neuropathy    feet, from chemo   Personal history of malignant neoplasm of breast 2010   Personal history of radiation therapy 2010   mammosite   T2DM (type 2 diabetes mellitus) (HCC) 2011    SURGICAL HISTORY: Past Surgical History:  Procedure Laterality Date   ABDOMINAL EXPLORATION SURGERY  2000   ovarian cyst   BREAST BIOPSY Right 2010   +    BREAST BIOPSY Left 04/06/2023   Left Breast Stereo Bx X Clip - path pending   BREAST BIOPSY Left 04/06/2023   Left Breast Stereo Bx, Coil Clip - path pending   BREAST BIOPSY Left 04/06/2023   MM LT BREAST BX W LOC DEV 1ST LESION IMAGE BX SPEC STEREO GUIDE 04/06/2023 [facility]   BREAST BIOPSY Left 04/06/2023   MM LT BREAST BX W LOC DEV EA AD LESION IMG BX SPEC STEREO GUIDE 04/06/2023 [facility]   BREAST EXCISIONAL BIOPSY Right 2010   + mammo site inasive mammo ca   BREAST LUMPECTOMY Right 2010   [facility]   COLONOSCOPY  07/19/2012   Normal exam, Dr. Lemar Livings   COLONOSCOPY WITH PROPOFOL N/A 04/21/2022   Procedure: COLONOSCOPY WITH PROPOFOL;  Surgeon: Earline Mayotte, MD;  Location: [facility];  Service: Endoscopy;  Laterality: N/A;   ESOPHAGOGASTRODUODENOSCOPY (EGD) WITH PROPOFOL N/A 10/22/2022   Procedure: ESOPHAGOGASTRODUODENOSCOPY (EGD) WITH PROPOFOL;  Surgeon: Midge Minium, MD;  Location[facility];  Service: Endoscopy;  Laterality: N/A;  Diabetic   MASTECTOMY Right 2010   partial/ lumpectomy   PORTACATH PLACEMENT Left 05/19/2022   Procedure: INSERTION PORT-A-CATH;  Surgeon: Earline Mayotte, MD;  Location: [facility];  Service: General;  Laterality: Left;   SIMPLE MASTECTOMY WITH AXILLARY SENTINEL NODE BIOPSY Right 04/26/2022   Procedure: SIMPLE MASTECTOMY WITH AXILLARY SENTINEL NODE BIOPSY;  Surgeon: Earline Mayotte, MD;  Location: [facility];  Service: General;   Laterality: Right;  RNFA to assist   TUBAL LIGATION  20 years ago    SOCIAL HISTORY: Social History   Socioeconomic History   Marital status: Married    Spouse name: Antonio   Number of children: 2   Years of education: Not on file   Highest education level: Not on file  Occupational History   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  Tobacco Use   Smoking status: Former    Packs/day: 0.25    Years: 10.00    Additional pack years: 0.00    Total pack years: 2.50    Types: Cigarettes    Start date: 1978    Quit date: 1988    Years since quitting: 36.4   Smokeless tobacco: Never   Tobacco comments:    smoking cessation materials not required  Vaping Use   Vaping Use: Never used  Substance and Sexual Activity   Alcohol use: Yes    Comment: wine occ   Drug use: No   Sexual activity: Not Currently    Comment: husband has ED  Other Topics Concern   Not on file  Social History Narrative   Oncology nurse on the floor retired.;  Husband retired from [employer].  No smoking.   Social Determinants of Health   Financial Resource Strain: Low Risk  (04/07/2023)   Overall Financial Resource Strain (CARDIA)    Difficulty of Paying Living Expenses: Not hard at all  Food Insecurity: No Food Insecurity (04/07/2023)   Hunger Vital Sign    Worried About Running Out of Food in the Last Year: Never true    Ran Out of Food in the Last Year: Never true  Transportation Needs: No Transportation Needs (04/07/2023)   PRAPARE - Administrator, Civil Service (Medical): No    Lack of Transportation (Non-Medical): No  Physical Activity: Insufficiently Active (04/07/2023)   Exercise Vital Sign    Days of Exercise per Week: 1 day    Minutes of Exercise per Session: 20 min  Stress: No Stress Concern Present (04/07/2023)   [employer] of Occupational Health - Occupational Stress Questionnaire    Feeling of Stress : Not at all  Social Connections: Moderately Integrated  (04/07/2023)   Social Connection and Isolation Panel [NHANES]    Frequency of Communication with Friends and Family: More than three times a week    Frequency of Social Gatherings with Friends and Family: Three times a week    Attends Religious Services: More than 4 times per year    Active Member of Clubs or Organizations: No    Attends Banker Meetings: Never    Marital Status: Married  Catering manager Violence: Not At Risk (04/07/2023)   Humiliation, Afraid, Rape, and Kick questionnaire    Fear of Current or Ex-Partner: No  Emotionally Abused: No    Physically Abused: No    Sexually Abused: No    FAMILY HISTORY: Family History  Problem Relation Age of Onset   Osteoporosis Mother    Diabetes Father    Kidney disease Father    Diabetes Brother    Breast cancer Maternal Aunt    Bladder Cancer Maternal Aunt    Cervical cancer Maternal Aunt    Colon cancer Maternal Uncle    Lung cancer Maternal Uncle     ALLERGIES:  is allergic to levaquin [levofloxacin in d5w], losartan, metoprolol, saxagliptin, amlodipine-olmesartan, canagliflozin, egg white [egg white (egg protein)], gabapentin, glipizide, gluten meal, lisinopril, metformin and related, milk (cow), milk-related compounds, shrimp extract, tramadol, zyprexa [olanzapine], actos [pioglitazone], atorvastatin, carvedilol, and prednisone.  MEDICATIONS:  Current Outpatient Medications  Medication Sig Dispense Refill   aspirin EC 81 MG tablet Take 1 tablet (81 mg total) by mouth daily. 30 tablet 0   atenolol (TENORMIN) 25 MG tablet Take 0.5 tablets (12.5 mg total) by mouth daily. 45 tablet 1   betamethasone, augmented, (DIPROLENE) 0.05 % lotion Apply 1 application  topically 2 (two) times daily.     Blood Glucose Monitoring Suppl (ONETOUCH VERIO) w/Device KIT 1 Device by Does not apply route once. 1 kit 0   cholecalciferol (VITAMIN D) 1000 UNITS tablet Take 1,000 Units by mouth daily.     Cyanocobalamin (VITAMIN B-12 PO)  Take 1 tablet by mouth daily as needed (fatigue).     EPINEPHrine (EPIPEN 2-PAK) 0.3 mg/0.3 mL IJ SOAJ injection Inject 0.3 mg into the muscle as needed for anaphylaxis. 2 each 0   famotidine (PEPCID) 20 MG tablet Take 1 tablet (20 mg total) by mouth 2 (two) times daily. 180 tablet 1   glucose blood (ONETOUCH VERIO) test strip Use as instructed 100 each 12   Lancets (ONETOUCH ULTRASOFT) lancets Use as instructed 100 each 3   levocetirizine (XYZAL) 5 MG tablet Take 1 tablet (5 mg total) by mouth every evening. 90 tablet 1   montelukast (SINGULAIR) 10 MG tablet TAKE 1 TABLET BY MOUTH EVERYDAY AT BEDTIME 90 tablet 1   Multiple Vitamin (MULTIVITAMIN WITH MINERALS) TABS tablet Take 1 tablet by mouth 2 (two) times a week.     OZEMPIC, 0.25 OR 0.5 MG/DOSE, 2 MG/1.5ML SOPN Inject 0.5 mg into the skin every Sunday.     rosuvastatin (CRESTOR) 10 MG tablet Take 1 tablet (10 mg total) by mouth every morning. 90 tablet 1   spironolactone (ALDACTONE) 50 MG tablet Take 1 tablet (50 mg total) by mouth daily. 90 tablet 1   No current facility-administered medications for this visit.   Facility-Administered Medications Ordered in Other Visits  Medication Dose Route Frequency Provider Last Rate Last Admin   heparin lock flush 100 unit/mL  500 Units Intravenous Once Earna Coder, MD          .  PHYSICAL EXAMINATION: ECOG PERFORMANCE STATUS: 0 - Asymptomatic  Vitals:   05/31/23 1030  BP: 136/74  Pulse: 79  Temp: 97.7 F (36.5 C)  SpO2: 99%      Filed Weights   05/31/23 1030  Weight: 173 lb 9.6 oz (78.7 kg)       Physical Exam Vitals and nursing note reviewed.  HENT:     Head: Normocephalic and atraumatic.     Mouth/Throat:     Pharynx: Oropharynx is clear.  Eyes:     Extraocular Movements: Extraocular movements intact.     Pupils: Pupils are equal,  round, and reactive to light.  Cardiovascular:     Rate and Rhythm: Normal rate and regular rhythm.  Pulmonary:     Comments:  Decreased breath sounds bilaterally.  Abdominal:     Palpations: Abdomen is soft.  Musculoskeletal:        General: Normal range of motion.     Cervical back: Normal range of motion.  Skin:    General: Skin is warm.  Neurological:     General: No focal deficit present.     Mental Status: She is alert and oriented to person, place, and time.  Psychiatric:        Behavior: Behavior normal.        Judgment: Judgment normal.      LABORATORY DATA:  I have reviewed the data as listed Lab Results  Component Value Date   WBC 4.4 05/31/2023   HGB 11.3 (L) 05/31/2023   HCT 36.0 05/31/2023   MCV 91.6 05/31/2023   PLT 177 05/31/2023   Recent Labs    03/29/23 0822 04/19/23 0956 05/10/23 0935  NA 137 134* 136  K 4.1 3.8 4.0  CL 106 104 101  CO2 24 22 26   GLUCOSE 100* 168* 92  BUN 15 13 16   CREATININE 0.95 0.92 0.94  CALCIUM 9.1 8.7* 9.3  GFRNONAA >60 >60 >60  PROT 7.2 6.7 7.4  ALBUMIN 3.6 3.4* 3.7  AST 20 24 20   ALT 17 14 14   ALKPHOS 79 72 86  BILITOT 0.6 0.4 0.5    RADIOGRAPHIC STUDIES: I have personally reviewed the radiological images as listed and agreed with the findings in the report. No results found.  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  Patient currently on adjuvant chemoimmunotherapy [KEYNOTE 522 study]; patient s/p carboplatin weekly Taxol.  Currently on Adriamycin-Cytoxan Keytruda-s/p 4 cycles [cycle #4 reduced to Adriamycin to 40 mg/m2].   # proceed with adjuvant Keytruda #8  out of planned 9 cycles. Labs today reviewed;  acceptable for treatment today. FEB 2024- TSH- WNL. Will follow up in 3 months after last treatment.   #left breast- mammogram-  [Dr.Sakai] There are two separate 10 mm groups of coarse heterogeneous calcification in the left breast that are low suspicion for malignancy. S/p   stereotactic guided biopsy- negative.  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 Sep 2023- vit D  40. Continue ca+vit D. stable  # DM- on  ozempic.  Monitor blood sugars closely. stable  # Hypokalemia- improved; ok to stop Kdur.  Stable   # port/IV access- Stable; discussed re: pro and cons of keeping the port vs. Explantation. Ok to explant after next/last treatment.   # DISPOSITION: # refer to Dr.Sakai re: port explantation-  # keytruda today # follow up in 3 weeks- APP; port-labs- cbc/cmp; Keytruda- Dr.B  All questions were answered. The patient/family knows to call the clinic with any problems, questions or concerns.    Earna Coder, MD 05/31/2023 10:52 AM

## 2023-06-08 ENCOUNTER — Inpatient Hospital Stay: Payer: Medicare Other | Admitting: Occupational Therapy

## 2023-06-16 DIAGNOSIS — C50911 Malignant neoplasm of unspecified site of right female breast: Secondary | ICD-10-CM | POA: Diagnosis not present

## 2023-06-21 ENCOUNTER — Inpatient Hospital Stay: Payer: Medicare Other

## 2023-06-21 ENCOUNTER — Inpatient Hospital Stay: Payer: Medicare Other | Attending: Oncology | Admitting: Medical Oncology

## 2023-06-21 ENCOUNTER — Other Ambulatory Visit: Payer: Self-pay | Admitting: Oncology

## 2023-06-21 ENCOUNTER — Encounter: Payer: Self-pay | Admitting: Medical Oncology

## 2023-06-21 VITALS — BP 101/78 | HR 73 | Temp 97.7°F | Wt 170.5 lb

## 2023-06-21 DIAGNOSIS — Z5112 Encounter for antineoplastic immunotherapy: Secondary | ICD-10-CM | POA: Insufficient documentation

## 2023-06-21 DIAGNOSIS — E860 Dehydration: Secondary | ICD-10-CM | POA: Diagnosis not present

## 2023-06-21 DIAGNOSIS — Z79899 Other long term (current) drug therapy: Secondary | ICD-10-CM | POA: Diagnosis not present

## 2023-06-21 DIAGNOSIS — Z803 Family history of malignant neoplasm of breast: Secondary | ICD-10-CM | POA: Insufficient documentation

## 2023-06-21 DIAGNOSIS — R7989 Other specified abnormal findings of blood chemistry: Secondary | ICD-10-CM | POA: Diagnosis not present

## 2023-06-21 DIAGNOSIS — Z171 Estrogen receptor negative status [ER-]: Secondary | ICD-10-CM | POA: Diagnosis not present

## 2023-06-21 DIAGNOSIS — E876 Hypokalemia: Secondary | ICD-10-CM

## 2023-06-21 DIAGNOSIS — C50411 Malignant neoplasm of upper-outer quadrant of right female breast: Secondary | ICD-10-CM | POA: Diagnosis not present

## 2023-06-21 DIAGNOSIS — Z95828 Presence of other vascular implants and grafts: Secondary | ICD-10-CM

## 2023-06-21 DIAGNOSIS — Z800 Family history of malignant neoplasm of digestive organs: Secondary | ICD-10-CM | POA: Diagnosis not present

## 2023-06-21 DIAGNOSIS — Z8049 Family history of malignant neoplasm of other genital organs: Secondary | ICD-10-CM | POA: Diagnosis not present

## 2023-06-21 DIAGNOSIS — Z9011 Acquired absence of right breast and nipple: Secondary | ICD-10-CM | POA: Diagnosis not present

## 2023-06-21 DIAGNOSIS — Z801 Family history of malignant neoplasm of trachea, bronchus and lung: Secondary | ICD-10-CM | POA: Insufficient documentation

## 2023-06-21 DIAGNOSIS — Z87891 Personal history of nicotine dependence: Secondary | ICD-10-CM | POA: Diagnosis not present

## 2023-06-21 DIAGNOSIS — Z8052 Family history of malignant neoplasm of bladder: Secondary | ICD-10-CM | POA: Insufficient documentation

## 2023-06-21 DIAGNOSIS — Z7982 Long term (current) use of aspirin: Secondary | ICD-10-CM | POA: Diagnosis not present

## 2023-06-21 DIAGNOSIS — C50811 Malignant neoplasm of overlapping sites of right female breast: Secondary | ICD-10-CM | POA: Diagnosis not present

## 2023-06-21 LAB — COMPREHENSIVE METABOLIC PANEL
ALT: 14 U/L (ref 0–44)
AST: 17 U/L (ref 15–41)
Albumin: 3.6 g/dL (ref 3.5–5.0)
Alkaline Phosphatase: 81 U/L (ref 38–126)
Anion gap: 9 (ref 5–15)
BUN: 22 mg/dL (ref 8–23)
CO2: 24 mmol/L (ref 22–32)
Calcium: 9.2 mg/dL (ref 8.9–10.3)
Chloride: 103 mmol/L (ref 98–111)
Creatinine, Ser: 1.04 mg/dL — ABNORMAL HIGH (ref 0.44–1.00)
GFR, Estimated: 57 mL/min — ABNORMAL LOW (ref >60)
Glucose, Bld: 89 mg/dL (ref 70–99)
Potassium: 4.2 mmol/L (ref 3.5–5.1)
Sodium: 136 mmol/L (ref 135–145)
Total Bilirubin: 0.5 mg/dL (ref 0.3–1.2)
Total Protein: 7.3 g/dL (ref 6.5–8.1)

## 2023-06-21 LAB — CBC WITH DIFFERENTIAL/PLATELET
Abs Immature Granulocytes: 0.03 10*3/uL (ref 0.00–0.07)
Basophils Absolute: 0 10*3/uL (ref 0.0–0.1)
Basophils Relative: 1 %
Eosinophils Absolute: 0.1 10*3/uL (ref 0.0–0.5)
Eosinophils Relative: 2 %
HCT: 34.8 % — ABNORMAL LOW (ref 36.0–46.0)
Hemoglobin: 11.1 g/dL — ABNORMAL LOW (ref 12.0–15.0)
Immature Granulocytes: 1 %
Lymphocytes Relative: 20 %
Lymphs Abs: 1 10*3/uL (ref 0.7–4.0)
MCH: 29.3 pg (ref 26.0–34.0)
MCHC: 31.9 g/dL (ref 30.0–36.0)
MCV: 91.8 fL (ref 80.0–100.0)
Monocytes Absolute: 0.3 10*3/uL (ref 0.1–1.0)
Monocytes Relative: 7 %
Neutro Abs: 3.4 10*3/uL (ref 1.7–7.7)
Neutrophils Relative %: 69 %
Platelets: 175 10*3/uL (ref 150–400)
RBC: 3.79 MIL/uL — ABNORMAL LOW (ref 3.87–5.11)
RDW: 14.4 % (ref 11.5–15.5)
WBC: 4.9 10*3/uL (ref 4.0–10.5)
nRBC: 0 % (ref 0.0–0.2)

## 2023-06-21 LAB — TSH: TSH: 2.132 u[IU]/mL (ref 0.350–4.500)

## 2023-06-21 MED ORDER — SODIUM CHLORIDE 0.9 % IV SOLN
200.0000 mg | Freq: Once | INTRAVENOUS | Status: AC
  Administered 2023-06-21: 200 mg via INTRAVENOUS
  Filled 2023-06-21: qty 8

## 2023-06-21 MED ORDER — SODIUM CHLORIDE 0.9 % IV SOLN
Freq: Once | INTRAVENOUS | Status: AC
  Filled 2023-06-21: qty 250

## 2023-06-21 MED ORDER — HEPARIN SOD (PORK) LOCK FLUSH 100 UNIT/ML IV SOLN
500.0000 [IU] | Freq: Once | INTRAVENOUS | Status: AC | PRN
  Administered 2023-06-21: 500 [IU]
  Filled 2023-06-21: qty 5

## 2023-06-21 NOTE — Progress Notes (Signed)
[facility] Office Visit Note  Patient Care Team: Alba Cory, MD as PCP - General (Family Medicine) Lemar Livings, Merrily Pew, MD (General Surgery) Bud Face, MD as Referring Physician (Otolaryngology) Alwyn Pea, MD as Consulting Physician (Cardiology) Jesusita Oka, MD as Referring Physician (Dermatology) Sherlon Handing, MD as Consulting Physician (Internal Medicine) Earna Coder, MD as Consulting Physician (Internal Medicine)  CHIEF COMPLAINTS/PURPOSE OF CONSULTATION: Breast cancer  #  Oncology History Overview Note  She has a remote history of right breast cancer, T1bN0, ER positive,s/p lumpectomy and MammoSite radiation and 5 years of Arimidex.    02/19/22 screening mammogram bilaterally showed  1. Suspicious right breast mass at the 10 o'clock position 8 cm from the nipple. Recommend ultrasound-guided biopsy. 2. Indeterminate right breast mass at the 6 o'clock position 4 cm from the nipple. Recommend ultrasound-guided biopsy. 3. No suspicious right axillary lymphadenopathy.   04/12/2022 Unilateral right breast diagnostic mammogram showed 1. Suspicious right breast mass at the 10 o'clock position 8 cm from the nipple. Recommend ultrasound-guided biopsy. 2. Indeterminate right breast mass at the 6 o'clock position 4 cm from the nipple. Recommend ultrasound-guided biopsy. 3. No suspicious right axillary lymphadenopathy.   04/13/2022 right breast mass 6 o'clock position 1 cm from the nipple showed invasive mammary carcinoma, no special type. Grade 3, DCIS present high grade, LVI not identified. ER-, PR 1-10%, HER 2 -   04/13/2022, right breast mass 10:00 7 cm from nipple biopsy showed invasive mammary carcinoma, grade 3, high-grade DCIS with focal comedonecrosis, lymphovascular invasion not identified.  ER -, PR 1 to 10%, HER2-    04/26/2022, right breast mastectomy with axillary dissection Invasive mammary carcinoma, no special type, multifocal,  DCIS high-grade, benign nipple/areola.  2 deposits of invasive mammary carcinoma, no definite residual lymph node identified.   Breast cancer (HCC)  05/10/2022 Initial Diagnosis   Breast cancer (HCC)   05/10/2022 Cancer Staging   Staging form: Breast, AJCC 8th Edition - Pathologic stage from 05/10/2022: Stage IIA (pT1c, pN1a, cM0, G3, ER-, PR+, HER2-) - Signed by Rickard Patience, MD on 05/10/2022 Stage prefix: Initial diagnosis Histologic grading system: 3 grade system   05/24/2022 - 05/24/2022 Chemotherapy   Patient is on Treatment Plan : BREAST Pembrolizumab (200) D1 + Carboplatin (5) D1 + Paclitaxel (80) D1,8,15 q21d X 4 cycles / Pembrolizumab (200) D1 + AC D1 q21d x 4 cycles     05/27/2022 - 08/13/2022 Chemotherapy   Patient is on Treatment Plan : BREAST Pembrolizumab (200) D1 + Carboplatin (5) D1 + Paclitaxel (80) D1,8,15 q21d X 4 cycles / Pembrolizumab (200) D1 + AC D1 q21d x 4 cycles     05/27/2022 - 12/07/2022 Chemotherapy   Patient is on Treatment Plan : BREAST Pembrolizumab (200) D1 + Carboplatin (5) D1 + Paclitaxel (80) D1,8,15 q21d X 4 cycles / Pembrolizumab (200) D1 + AC D1 q21d x 4 cycles     01/04/2023 -  Chemotherapy   Patient is on Treatment Plan : BREAST Pembrolizumab (200) q21d x 27 weeks     Carcinoma of upper-outer quadrant of right breast in female, estrogen receptor negative (HCC)  05/26/2022 Initial Diagnosis   Carcinoma of upper-outer quadrant of right breast in female, estrogen receptor negative (HCC)   05/26/2022 Cancer Staging   Staging form: Breast, AJCC 8th Edition - Pathologic: Stage IIA (pT1c, pN1, cM0, G3, ER-, PR-, HER2-) - Signed by Earna Coder, MD on 05/26/2022 Multigene prognostic tests performed: None Histologic grading system: 3 grade system  05/27/2022 - 12/07/2022 Chemotherapy   Patient is on Treatment Plan : BREAST Pembrolizumab (200) D1 + Carboplatin (5) D1 + Paclitaxel (80) D1,8,15 q21d X 4 cycles / Pembrolizumab (200) D1 + AC D1 q21d x 4 cycles      01/04/2023 -  Chemotherapy   Patient is on Treatment Plan : BREAST Pembrolizumab (200) q21d x 27 weeks      HISTORY OF PRESENTING ILLNESS: Ambulating independently.  Alone.   Morgan Peterson 72 y.o.  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   She reports that she is doing well. Her biggest side effect with the Rande Lawman has been constipation. Now she takes Miralax BID and this has resolved her constipation. She still has neuropathy but it is improving some.    Denies any nausea, vomiting, fevers, rash, chills. Weight is down slightly. Appetite is good but she does report that she did not eat or drink as much as usual over this past weekend. She is working on improving this.   Wt Readings from Last 3 Encounters:  06/21/23 170 lb 8 oz (77.3 kg)  05/31/23 173 lb 9.6 oz (78.7 kg)  05/10/23 169 lb 9.6 oz (76.9 kg)     Review of Systems  Constitutional:  Positive for malaise/fatigue. Negative for chills, diaphoresis, fever and weight loss.  HENT:  Negative for nosebleeds and sore throat.   Eyes:  Negative for double vision.  Respiratory:  Negative for cough, hemoptysis, sputum production, shortness of breath and wheezing.   Cardiovascular:  Negative for chest pain, palpitations, orthopnea and leg swelling.  Gastrointestinal:  Negative for abdominal pain, blood in stool, constipation, diarrhea, heartburn, melena, nausea and vomiting.  Genitourinary:  Negative for dysuria, frequency and urgency.  Musculoskeletal:  Negative for back pain and joint pain.  Skin: Negative.  Negative for itching and rash.  Neurological:  Positive for tingling. Negative for dizziness, focal weakness, weakness and headaches.  Endo/Heme/Allergies:  Does not bruise/bleed easily.  Psychiatric/Behavioral:  Negative for  depression. The patient is not nervous/anxious and does not have insomnia.      MEDICAL HISTORY:  Past Medical History:  Diagnosis Date   Appetite loss    Asthma    Cervical radiculopathy    Chemotherapy-induced nausea    Hiatal hernia    Hypercholesterolemia    Hyperlipidemia    Hypertension    IBS (irritable bowel syndrome)    Malignant neoplasm of upper-outer quadrant of female breast (HCC) 04/11/2009   Right breast, invasive ductal carcinoma, 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 Arimidex therapy completed September 2015   Mild mitral valve prolapse    Neuropathy    feet, from chemo   Personal history of malignant neoplasm of breast 2010   Personal history of radiation therapy 2010   mammosite   T2DM (type 2 diabetes mellitus) (HCC) 2011    SURGICAL HISTORY: Past Surgical History:  Procedure Laterality Date   ABDOMINAL EXPLORATION SURGERY  2000   ovarian cyst   BREAST BIOPSY Right 2010   +    BREAST BIOPSY Left 04/06/2023   Left Breast Stereo Bx X Clip - path pending   BREAST BIOPSY Left 04/06/2023   Left Breast Stereo Bx, Coil Clip - path pending   BREAST BIOPSY Left 04/06/2023   MM  LT BREAST BX W LOC DEV 1ST LESION IMAGE BX SPEC STEREO GUIDE 04/06/2023 [facility]   BREAST BIOPSY Left 04/06/2023   MM LT BREAST BX W LOC DEV EA AD LESION IMG BX SPEC STEREO GUIDE 04/06/2023 [facility]   BREAST EXCISIONAL BIOPSY Right 2010   + mammo site inasive mammo ca   BREAST LUMPECTOMY Right 2010   [facility]   COLONOSCOPY  07/19/2012   Normal exam, Dr. Lemar Livings   COLONOSCOPY WITH PROPOFOL N/A 04/21/2022   Procedure: COLONOSCOPY WITH PROPOFOL;  Surgeon: Earline Mayotte, MD;  Location: [facility];  Service: Endoscopy;  Laterality: N/A;   ESOPHAGOGASTRODUODENOSCOPY (EGD) WITH PROPOFOL N/A 10/22/2022   Procedure: ESOPHAGOGASTRODUODENOSCOPY (EGD) WITH PROPOFOL;  Surgeon: Midge Minium, MD;  Location: [facility];   Service: Endoscopy;  Laterality: N/A;  Diabetic   MASTECTOMY Right 2010   partial/ lumpectomy   PORTACATH PLACEMENT Left 05/19/2022   Procedure: INSERTION PORT-A-CATH;  Surgeon: Earline Mayotte, MD;  Location: [facility];  Service: General;  Laterality: Left;   SIMPLE MASTECTOMY WITH AXILLARY SENTINEL NODE BIOPSY Right 04/26/2022   Procedure: SIMPLE MASTECTOMY WITH AXILLARY SENTINEL NODE BIOPSY;  Surgeon: Earline Mayotte, MD;  Location: [facility];  Service: General;  Laterality: Right;  RNFA to assist   TUBAL LIGATION  20 years ago    SOCIAL HISTORY: Social History   Socioeconomic History   Marital status: Married    Spouse name: Antonio   Number of children: 2   Years of education: Not on file   Highest education level: Not on file  Occupational History   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  Tobacco Use   Smoking status: Former    Packs/day: 0.25    Years: 10.00    Additional pack years: 0.00    Total pack years: 2.50    Types: Cigarettes    Start date: 1978    Quit date: 1988    Years since quitting: 36.5   Smokeless tobacco: Never   Tobacco comments:    smoking cessation materials not required  Vaping Use   Vaping Use: Never used  Substance and Sexual Activity   Alcohol use: Yes    Comment: wine occ   Drug use: No   Sexual activity: Not Currently    Comment: husband has ED  Other Topics Concern   Not on file  Social History Narrative   Oncology nurse on the floor retired.;  Husband retired from [employer].  No smoking.   Social Determinants of Health   Financial Resource Strain: Low Risk  (04/07/2023)   Overall Financial Resource Strain (CARDIA)    Difficulty of Paying Living Expenses: Not hard at all  Food Insecurity: No Food Insecurity (04/07/2023)   Hunger Vital Sign    Worried About Running Out of Food in the Last Year: Never true    Ran Out of Food in the Last Year: Never true  Transportation Needs: No Transportation Needs (04/07/2023)    PRAPARE - Administrator, Civil Service (Medical): No    Lack of Transportation (Non-Medical): No  Physical Activity: Insufficiently Active (04/07/2023)   Exercise Vital Sign    Days of Exercise per Week: 1 day    Minutes of Exercise per Session: 20 min  Stress: No Stress Concern Present (04/07/2023)   [employer] of Occupational Health - Occupational Stress Questionnaire    Feeling of Stress : Not at all  Social Connections: Moderately Integrated (04/07/2023)   Social Connection and Isolation  Panel [NHANES]    Frequency of Communication with Friends and Family: More than three times a week    Frequency of Social Gatherings with Friends and Family: Three times a week    Attends Religious Services: More than 4 times per year    Active Member of Clubs or Organizations: No    Attends Banker Meetings: Never    Marital Status: Married  Catering manager Violence: Not At Risk (04/07/2023)   Humiliation, Afraid, Rape, and Kick questionnaire    Fear of Current or Ex-Partner: No    Emotionally Abused: No    Physically Abused: No    Sexually Abused: No    FAMILY HISTORY: Family History  Problem Relation Age of Onset   Osteoporosis Mother    Diabetes Father    Kidney disease Father    Diabetes Brother    Breast cancer Maternal Aunt    Bladder Cancer Maternal Aunt    Cervical cancer Maternal Aunt    Colon cancer Maternal Uncle    Lung cancer Maternal Uncle     ALLERGIES:  is allergic to levaquin [levofloxacin in d5w], losartan, metoprolol, saxagliptin, amlodipine-olmesartan, canagliflozin, egg white [egg white (egg protein)], gabapentin, glipizide, gluten meal, lisinopril, metformin and related, milk (cow), milk-related compounds, shrimp extract, tramadol, zyprexa [olanzapine], actos [pioglitazone], atorvastatin, carvedilol, and prednisone.  MEDICATIONS:  Current Outpatient Medications  Medication Sig Dispense Refill   aspirin EC 81 MG tablet Take 1 tablet  (81 mg total) by mouth daily. 30 tablet 0   atenolol (TENORMIN) 25 MG tablet Take 0.5 tablets (12.5 mg total) by mouth daily. 45 tablet 1   betamethasone, augmented, (DIPROLENE) 0.05 % lotion Apply 1 application  topically 2 (two) times daily.     Blood Glucose Monitoring Suppl (ONETOUCH VERIO) w/Device KIT 1 Device by Does not apply route once. 1 kit 0   cholecalciferol (VITAMIN D) 1000 UNITS tablet Take 1,000 Units by mouth daily.     Cyanocobalamin (VITAMIN B-12 PO) Take 1 tablet by mouth daily as needed (fatigue).     EPINEPHrine (EPIPEN 2-PAK) 0.3 mg/0.3 mL IJ SOAJ injection Inject 0.3 mg into the muscle as needed for anaphylaxis. 2 each 0   famotidine (PEPCID) 20 MG tablet Take 1 tablet (20 mg total) by mouth 2 (two) times daily. 180 tablet 1   glucose blood (ONETOUCH VERIO) test strip Use as instructed 100 each 12   Lancets (ONETOUCH ULTRASOFT) lancets Use as instructed 100 each 3   levocetirizine (XYZAL) 5 MG tablet Take 1 tablet (5 mg total) by mouth every evening. 90 tablet 1   montelukast (SINGULAIR) 10 MG tablet TAKE 1 TABLET BY MOUTH EVERYDAY AT BEDTIME 90 tablet 1   Multiple Vitamin (MULTIVITAMIN WITH MINERALS) TABS tablet Take 1 tablet by mouth 2 (two) times a week.     OZEMPIC, 0.25 OR 0.5 MG/DOSE, 2 MG/1.5ML SOPN Inject 0.5 mg into the skin every Sunday.     rosuvastatin (CRESTOR) 10 MG tablet Take 1 tablet (10 mg total) by mouth every morning. 90 tablet 1   spironolactone (ALDACTONE) 50 MG tablet Take 1 tablet (50 mg total) by mouth daily. 90 tablet 1   No current facility-administered medications for this visit.   Facility-Administered Medications Ordered in Other Visits  Medication Dose Route Frequency Provider Last Rate Last Admin   heparin lock flush 100 UNIT/ML injection            heparin lock flush 100 unit/mL  500 Units Intravenous Once [place], Govinda R,  MD       PHYSICAL EXAMINATION: ECOG PERFORMANCE STATUS: 0 - Asymptomatic  Vitals:   06/21/23 0938  BP:  101/78  Pulse: 73  Temp: 97.7 F (36.5 C)  SpO2: 100%   Filed Weights   06/21/23 0938  Weight: 170 lb 8 oz (77.3 kg)   Physical Exam Vitals and nursing note reviewed.  HENT:     Head: Normocephalic and atraumatic.     Mouth/Throat:     Pharynx: Oropharynx is clear.  Eyes:     Extraocular Movements: Extraocular movements intact.     Pupils: Pupils are equal, round, and reactive to light.  Cardiovascular:     Rate and Rhythm: Normal rate and regular rhythm.  Pulmonary:     Comments: Decreased breath sounds bilaterally.  Abdominal:     Palpations: Abdomen is soft.  Musculoskeletal:        General: Normal range of motion.     Cervical back: Normal range of motion.  Skin:    General: Skin is warm.  Neurological:     General: No focal deficit present.     Mental Status: She is alert and oriented to person, place, and time.  Psychiatric:        Behavior: Behavior normal.        Judgment: Judgment normal.      LABORATORY DATA:  I have reviewed the data as listed Lab Results  Component Value Date   WBC 4.9 06/21/2023   HGB 11.1 (L) 06/21/2023   HCT 34.8 (L) 06/21/2023   MCV 91.8 06/21/2023   PLT 175 06/21/2023   Recent Labs    05/10/23 0935 05/31/23 1018 06/21/23 0931  NA 136 140 136  K 4.0 4.1 4.2  CL 101 106 103  CO2 26 24 24   GLUCOSE 92 110* 89  BUN 16 16 22   CREATININE 0.94 0.94 1.04*  CALCIUM 9.3 9.3 9.2  GFRNONAA >60 >60 57*  PROT 7.4 7.4 7.3  ALBUMIN 3.7 3.8 3.6  AST 20 16 17   ALT 14 15 14   ALKPHOS 86 84 81  BILITOT 0.5 0.5 0.5     ASSESSMENT & PLAN:   Morgan Peterson is a 72 y.o. female here for follow up for her breast cancer and consideration of Keytruda treatment today.    Carcinoma of upper-outer quadrant of right breast in female, estrogen receptor negative (HCC) # Stage II triple negative breast cancer [mT1cN1; Grade 3 ]- s/p mastectomy.  Patient currently on adjuvant chemoimmunotherapy [KEYNOTE 522 study]; patient s/p carboplatin  weekly Taxol.  S/p Adriamycin-Cytoxan Keytruda-s/p 4 cycles [cycle #4 reduced to Adriamycin to 40 mg/m2].    # proceed with adjuvant Keytruda #9  out of planned 9 cycles. Labs today reviewed;  acceptable for treatment today. TSH pending.  Will follow up in 3 months as planned by Dr. Kizzie Bane.    #left breast- mammogram-  [Dr.Sakai] There are two separate 10 mm groups of coarse heterogeneous calcification in the left breast that are low suspicion for malignancy. S/p  stereotactic guided biopsy- negative.  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 Sep 2023- vit D  40. Continue ca+vit D. stable   # DM- on  ozempic.  Monitor blood sugars closely. stable   # Hypokalemia- Resolved post chemotherapy. Stable  # Elevated creatinine. New.  Mild. 1.04. secondary to decreased fluid intake. 500 ml IVF planned for today.     # port/IV access- Stable; Referral to Dr. Tonna Boehringer previously placed for port explantation following last dose of Keytruda.    # DISPOSITION: # keytruda today # follow up in 3 months MD, labs ( CBC w/, CMP, TSH, T4)    All questions were answered. The patient/family knows to call the clinic with any problems, questions or concerns.    Rushie Chestnut, PA-C 06/21/2023 10:03 AM

## 2023-06-21 NOTE — Addendum Note (Signed)
Addended by: Alinda Deem H on: 06/21/2023 03:31 PM   Modules accepted: Orders

## 2023-06-21 NOTE — Patient Instructions (Signed)
[facility] [place]  Discharge Instructions: Thank you for choosing [facility] to provide your oncology and hematology care.  If you have a lab appointment with the Cancer Center, please go directly to the Cancer Center and check in at the registration area.  Wear comfortable clothing and clothing appropriate for easy access to any Portacath or PICC line.   We strive to give you quality time with your provider. You may need to reschedule your appointment if you arrive late (15 or more minutes).  Arriving late affects you and other patients whose appointments are after yours.  Also, if you miss three or more appointments without notifying the office, you may be dismissed from the clinic at the provider's discretion.      For prescription refill requests, have your pharmacy contact our office and allow 72 hours for refills to be completed.    Today you received the following chemotherapy and/or immunotherapy agents- Keytruda      To help prevent nausea and vomiting after your treatment, we encourage you to take your nausea medication as directed.  BELOW ARE SYMPTOMS THAT SHOULD BE REPORTED IMMEDIATELY: *FEVER GREATER THAN 100.4 F (38 C) OR HIGHER *CHILLS OR SWEATING *NAUSEA AND VOMITING THAT IS NOT CONTROLLED WITH YOUR NAUSEA MEDICATION *UNUSUAL SHORTNESS OF BREATH *UNUSUAL BRUISING OR BLEEDING *URINARY PROBLEMS (pain or burning when urinating, or frequent urination) *BOWEL PROBLEMS (unusual diarrhea, constipation, pain near the anus) TENDERNESS IN MOUTH AND THROAT WITH OR WITHOUT PRESENCE OF ULCERS (sore throat, sores in mouth, or a toothache) UNUSUAL RASH, SWELLING OR PAIN  UNUSUAL VAGINAL DISCHARGE OR ITCHING   Items with * indicate a potential emergency and should be followed up as soon as possible or go to the Emergency Department if any problems should occur.  Please show the CHEMOTHERAPY ALERT CARD or IMMUNOTHERAPY ALERT CARD at check-in to  the Emergency Department and triage nurse.  Should you have questions after your visit or need to cancel or reschedule your appointment, please contact [facility] [facility]  [phone] and follow the prompts.  Office hours are 8:00 a.m. to 4:30 p.m. Monday - Friday. Please note that voicemails left after 4:00 p.m. may not be returned until the following business day.  We are closed weekends and major holidays. You have access to a nurse at all times for urgent questions. Please call the main number to the clinic [phone] and follow the prompts.  For any non-urgent questions, you may also contact your provider using MyChart. We now offer e-Visits for anyone 18 and older to request care online for non-urgent symptoms. For details visit [email or web address].   Also download the MyChart app! Go to the app store, search "MyChart", open the app, select [facility], and log in with your MyChart username and password.   

## 2023-06-22 LAB — T4: T4, Total: 5.9 ug/dL (ref 4.5–12.0)

## 2023-06-24 ENCOUNTER — Encounter: Payer: Self-pay | Admitting: Internal Medicine

## 2023-06-29 ENCOUNTER — Telehealth: Payer: Self-pay | Admitting: *Deleted

## 2023-06-29 NOTE — Telephone Encounter (Signed)
Morgan Peterson called reporting that she had her last treatment and now wants her port removed and for order to be sent to Dr Hazle Quant   Also asking if the gnawing feeling in her stomach will go away or does she need medicine for it, she is currently using Pepto for it. Please advse

## 2023-06-30 ENCOUNTER — Encounter: Payer: Self-pay | Admitting: Internal Medicine

## 2023-06-30 NOTE — Telephone Encounter (Signed)
MyChart message sent to patient.

## 2023-07-01 DIAGNOSIS — L668 Other cicatricial alopecia: Secondary | ICD-10-CM | POA: Diagnosis not present

## 2023-07-01 DIAGNOSIS — L648 Other androgenic alopecia: Secondary | ICD-10-CM | POA: Diagnosis not present

## 2023-07-01 DIAGNOSIS — Z79899 Other long term (current) drug therapy: Secondary | ICD-10-CM | POA: Diagnosis not present

## 2023-07-12 ENCOUNTER — Other Ambulatory Visit: Payer: Medicare Other

## 2023-07-12 ENCOUNTER — Ambulatory Visit: Payer: Medicare Other | Admitting: Internal Medicine

## 2023-07-12 ENCOUNTER — Ambulatory Visit: Payer: Medicare Other

## 2023-08-08 DIAGNOSIS — C50411 Malignant neoplasm of upper-outer quadrant of right female breast: Secondary | ICD-10-CM | POA: Diagnosis not present

## 2023-08-24 DIAGNOSIS — E119 Type 2 diabetes mellitus without complications: Secondary | ICD-10-CM | POA: Diagnosis not present

## 2023-08-24 DIAGNOSIS — E785 Hyperlipidemia, unspecified: Secondary | ICD-10-CM | POA: Diagnosis not present

## 2023-08-24 DIAGNOSIS — I152 Hypertension secondary to endocrine disorders: Secondary | ICD-10-CM | POA: Diagnosis not present

## 2023-08-24 DIAGNOSIS — E1159 Type 2 diabetes mellitus with other circulatory complications: Secondary | ICD-10-CM | POA: Diagnosis not present

## 2023-08-24 DIAGNOSIS — Z794 Long term (current) use of insulin: Secondary | ICD-10-CM | POA: Diagnosis not present

## 2023-08-24 DIAGNOSIS — E1169 Type 2 diabetes mellitus with other specified complication: Secondary | ICD-10-CM | POA: Diagnosis not present

## 2023-08-24 LAB — HEMOGLOBIN A1C: Hemoglobin A1C: 6.2

## 2023-09-12 ENCOUNTER — Other Ambulatory Visit: Payer: Self-pay | Admitting: Family Medicine

## 2023-09-12 ENCOUNTER — Ambulatory Visit: Payer: Self-pay

## 2023-09-12 DIAGNOSIS — I10 Essential (primary) hypertension: Secondary | ICD-10-CM

## 2023-09-12 NOTE — Telephone Encounter (Signed)
  Chief Complaint: Pt needs a new rx Symptoms: none Frequency: now Pertinent Negatives: Patient denies  Disposition: [] ED /[] Urgent Care (no appt availability in office) / [] Appointment(In office/virtual)/ []  [facility]/ [] Home Care/ [] Refused Recommended Disposition /[] [facility]/ [x]  Follow-up with PCP Additional Notes:  Summary: script change   Pt called in about med,  Atenolol 12.5 mg needs new script changed to say take 1 tab, instead of a half of pill because it can't be broken up.     Reason for Disposition  [1] Caller has NON-URGENT medicine question about med that PCP prescribed AND [2] triager unable to answer question  Answer Assessment - Initial Assessment Questions 1. NAME of MEDICINE: "What medicine(s) are you calling about?"     atenolol  Protocols used: Medication Question Call-A-AH

## 2023-09-12 NOTE — Telephone Encounter (Signed)
Summary: script change   Pt called in about med,  Atenolol 12.5 mg needs new script changed to say take 1 tab, instead of a half of pill because it can't be broken up.       Called pt - left message to return our call.

## 2023-09-13 ENCOUNTER — Other Ambulatory Visit: Payer: Self-pay | Admitting: Nurse Practitioner

## 2023-09-13 ENCOUNTER — Other Ambulatory Visit: Payer: Self-pay

## 2023-09-13 DIAGNOSIS — I10 Essential (primary) hypertension: Secondary | ICD-10-CM

## 2023-09-13 MED ORDER — ATENOLOL 25 MG PO TABS
25.0000 mg | ORAL_TABLET | Freq: Every day | ORAL | 0 refills | Status: DC
Start: 2023-09-13 — End: 2023-10-11

## 2023-09-13 NOTE — Telephone Encounter (Signed)
Patient was directed to take a half tab due to low end of bp at last visit. Patient made Dr. Carlynn Purl aware she couldn't half them so she's been taking 1 tab. Monitoring bp at home has been in range. Original rx for Atenolol sent in.

## 2023-09-17 ENCOUNTER — Other Ambulatory Visit: Payer: Self-pay | Admitting: Family Medicine

## 2023-09-17 DIAGNOSIS — J302 Other seasonal allergic rhinitis: Secondary | ICD-10-CM

## 2023-09-21 ENCOUNTER — Encounter: Payer: Self-pay | Admitting: Internal Medicine

## 2023-09-21 ENCOUNTER — Inpatient Hospital Stay (HOSPITAL_BASED_OUTPATIENT_CLINIC_OR_DEPARTMENT_OTHER): Payer: Medicare Other | Admitting: Internal Medicine

## 2023-09-21 ENCOUNTER — Inpatient Hospital Stay: Payer: Medicare Other | Attending: Oncology

## 2023-09-21 DIAGNOSIS — C50411 Malignant neoplasm of upper-outer quadrant of right female breast: Secondary | ICD-10-CM | POA: Diagnosis not present

## 2023-09-21 DIAGNOSIS — E119 Type 2 diabetes mellitus without complications: Secondary | ICD-10-CM | POA: Insufficient documentation

## 2023-09-21 DIAGNOSIS — Z9011 Acquired absence of right breast and nipple: Secondary | ICD-10-CM | POA: Insufficient documentation

## 2023-09-21 DIAGNOSIS — Z171 Estrogen receptor negative status [ER-]: Secondary | ICD-10-CM | POA: Diagnosis not present

## 2023-09-21 DIAGNOSIS — Z7982 Long term (current) use of aspirin: Secondary | ICD-10-CM | POA: Insufficient documentation

## 2023-09-21 DIAGNOSIS — Z79899 Other long term (current) drug therapy: Secondary | ICD-10-CM | POA: Insufficient documentation

## 2023-09-21 DIAGNOSIS — Z923 Personal history of irradiation: Secondary | ICD-10-CM | POA: Diagnosis not present

## 2023-09-21 DIAGNOSIS — Z87891 Personal history of nicotine dependence: Secondary | ICD-10-CM | POA: Insufficient documentation

## 2023-09-21 DIAGNOSIS — Z7985 Long-term (current) use of injectable non-insulin antidiabetic drugs: Secondary | ICD-10-CM | POA: Insufficient documentation

## 2023-09-21 DIAGNOSIS — E876 Hypokalemia: Secondary | ICD-10-CM | POA: Diagnosis not present

## 2023-09-21 DIAGNOSIS — G620 Drug-induced polyneuropathy: Secondary | ICD-10-CM | POA: Diagnosis not present

## 2023-09-21 LAB — CBC WITH DIFFERENTIAL/PLATELET
Abs Immature Granulocytes: 0.02 10*3/uL (ref 0.00–0.07)
Basophils Absolute: 0 10*3/uL (ref 0.0–0.1)
Basophils Relative: 1 %
Eosinophils Absolute: 0.1 10*3/uL (ref 0.0–0.5)
Eosinophils Relative: 1 %
HCT: 37 % (ref 36.0–46.0)
Hemoglobin: 11.8 g/dL — ABNORMAL LOW (ref 12.0–15.0)
Immature Granulocytes: 0 %
Lymphocytes Relative: 24 %
Lymphs Abs: 1.1 10*3/uL (ref 0.7–4.0)
MCH: 30.2 pg (ref 26.0–34.0)
MCHC: 31.9 g/dL (ref 30.0–36.0)
MCV: 94.6 fL (ref 80.0–100.0)
Monocytes Absolute: 0.3 10*3/uL (ref 0.1–1.0)
Monocytes Relative: 7 %
Neutro Abs: 2.9 10*3/uL (ref 1.7–7.7)
Neutrophils Relative %: 67 %
Platelets: 195 10*3/uL (ref 150–400)
RBC: 3.91 MIL/uL (ref 3.87–5.11)
RDW: 13.3 % (ref 11.5–15.5)
WBC: 4.5 10*3/uL (ref 4.0–10.5)
nRBC: 0 % (ref 0.0–0.2)

## 2023-09-21 LAB — COMPREHENSIVE METABOLIC PANEL
ALT: 14 U/L (ref 0–44)
AST: 17 U/L (ref 15–41)
Albumin: 3.7 g/dL (ref 3.5–5.0)
Alkaline Phosphatase: 83 U/L (ref 38–126)
Anion gap: 9 (ref 5–15)
BUN: 13 mg/dL (ref 8–23)
CO2: 25 mmol/L (ref 22–32)
Calcium: 9.4 mg/dL (ref 8.9–10.3)
Chloride: 102 mmol/L (ref 98–111)
Creatinine, Ser: 0.95 mg/dL (ref 0.44–1.00)
GFR, Estimated: >60 mL/min (ref >60)
Glucose, Bld: 101 mg/dL — ABNORMAL HIGH (ref 70–99)
Potassium: 4.7 mmol/L (ref 3.5–5.1)
Sodium: 136 mmol/L (ref 135–145)
Total Bilirubin: 0.5 mg/dL (ref 0.3–1.2)
Total Protein: 7.2 g/dL (ref 6.5–8.1)

## 2023-09-21 LAB — TSH: TSH: 1.349 u[IU]/mL (ref 0.350–4.500)

## 2023-09-21 LAB — T4, FREE: Free T4: 0.81 ng/dL (ref 0.61–1.12)

## 2023-09-21 NOTE — Progress Notes (Signed)
C/o scratchy throat off and on.  Has had port removed.  Can she have a glass of red wine to celebrate birthday?

## 2023-09-21 NOTE — Progress Notes (Signed)
Survivorship Care Plan visit completed.  Treatment summary reviewed and given to patient.  ASCO answers booklet reviewed and given to patient.  CARE program and Cancer Transitions discussed with patient along with other resources cancer center offers to patients and caregivers.  Patient verbalized understanding.    

## 2023-09-21 NOTE — Progress Notes (Signed)
[facility] CONSULT NOTE  Patient Care Team: Alba Cory, MD as PCP - General (Family Medicine) Lemar Livings, Merrily Pew, MD (General Surgery) Bud Face, MD as Referring Physician (Otolaryngology) Alwyn Pea, MD as Consulting Physician (Cardiology) Jesusita Oka, MD as Referring Physician (Dermatology) Sherlon Handing, MD as Consulting Physician (Internal Medicine) Earna Coder, MD as Consulting Physician (Internal Medicine) Sung Amabile, DO as Consulting Physician (General Surgery)  CHIEF COMPLAINTS/PURPOSE OF CONSULTATION: Breast cancer  #  Oncology History Overview Note  She has a remote history of right breast cancer, T1bN0, ER positive,s/p lumpectomy and MammoSite radiation and 5 years of Arimidex.    02/19/22 screening mammogram bilaterally showed  1. Suspicious right breast mass at the 10 o'clock position 8 cm from the nipple. Recommend ultrasound-guided biopsy. 2. Indeterminate right breast mass at the 6 o'clock position 4 cm from the nipple. Recommend ultrasound-guided biopsy. 3. No suspicious right axillary lymphadenopathy.   04/12/2022 Unilateral right breast diagnostic mammogram showed 1. Suspicious right breast mass at the 10 o'clock position 8 cm from the nipple. Recommend ultrasound-guided biopsy. 2. Indeterminate right breast mass at the 6 o'clock position 4 cm from the nipple. Recommend ultrasound-guided biopsy. 3. No suspicious right axillary lymphadenopathy.   04/13/2022 right breast mass 6 o'clock position 1 cm from the nipple showed invasive mammary carcinoma, no special type. Grade 3, DCIS present high grade, LVI not identified. ER-, PR 1-10%, HER 2 -   04/13/2022, right breast mass 10:00 7 cm from nipple biopsy showed invasive mammary carcinoma, grade 3, high-grade DCIS with focal comedonecrosis, lymphovascular invasion not identified.  ER -, PR 1 to 10%, HER2-    04/26/2022, right breast mastectomy with axillary  dissection Invasive mammary carcinoma, no special type, multifocal, DCIS high-grade, benign nipple/areola.  2 deposits of invasive mammary carcinoma, no definite residual lymph node identified.   Breast cancer (HCC)  05/10/2022 Initial Diagnosis   Breast cancer (HCC)   05/10/2022 Cancer Staging   Staging form: Breast, AJCC 8th Edition - Pathologic stage from 05/10/2022: Stage IIA (pT1c, pN1a, cM0, G3, ER-, PR+, HER2-) - Signed by Rickard Patience, MD on 05/10/2022 Stage prefix: Initial diagnosis Histologic grading system: 3 grade system   05/24/2022 - 05/24/2022 Chemotherapy   Patient is on Treatment Plan : BREAST Pembrolizumab (200) D1 + Carboplatin (5) D1 + Paclitaxel (80) D1,8,15 q21d X 4 cycles / Pembrolizumab (200) D1 + AC D1 q21d x 4 cycles     05/27/2022 - 08/13/2022 Chemotherapy   Patient is on Treatment Plan : BREAST Pembrolizumab (200) D1 + Carboplatin (5) D1 + Paclitaxel (80) D1,8,15 q21d X 4 cycles / Pembrolizumab (200) D1 + AC D1 q21d x 4 cycles     05/27/2022 - 12/07/2022 Chemotherapy   Patient is on Treatment Plan : BREAST Pembrolizumab (200) D1 + Carboplatin (5) D1 + Paclitaxel (80) D1,8,15 q21d X 4 cycles / Pembrolizumab (200) D1 + AC D1 q21d x 4 cycles     01/04/2023 -  Chemotherapy   Patient is on Treatment Plan : BREAST Pembrolizumab (200) q21d x 27 weeks     Carcinoma of upper-outer quadrant of right breast in female, estrogen receptor negative (HCC)  05/26/2022 Initial Diagnosis   Carcinoma of upper-outer quadrant of right breast in female, estrogen receptor negative (HCC)   05/26/2022 Cancer Staging   Staging form: Breast, AJCC 8th Edition - Pathologic: Stage IIA (pT1c, pN1, cM0, G3, ER-, PR-, HER2-) - Signed by Earna Coder, MD on 05/26/2022 Multigene prognostic tests performed: None  Histologic grading system: 3 grade system   05/27/2022 - 12/07/2022 Chemotherapy   Patient is on Treatment Plan : BREAST Pembrolizumab (200) D1 + Carboplatin (5) D1 + Paclitaxel (80) D1,8,15 q21d X  4 cycles / Pembrolizumab (200) D1 + AC D1 q21d x 4 cycles     01/04/2023 -  Chemotherapy   Patient is on Treatment Plan : BREAST Pembrolizumab (200) q21d x 27 weeks      HISTORY OF PRESENTING ILLNESS: Ambulating independently.  Alone.   Mertie Moores 72 y.o.  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     In the interim had port removed.  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 but much improved.  Appetite is better.    Denies any nausea . No fever no chills.   Review of Systems  Constitutional:  Positive for malaise/fatigue. Negative for chills, diaphoresis, fever and weight loss.  HENT:  Negative for nosebleeds and sore throat.   Eyes:  Negative for double vision.  Respiratory:  Negative for cough, hemoptysis, sputum production, shortness of breath and wheezing.   Cardiovascular:  Negative for chest pain, palpitations, orthopnea and leg swelling.  Gastrointestinal:  Negative for abdominal pain, blood in stool, constipation, diarrhea, heartburn, melena, nausea and vomiting.  Genitourinary:  Negative for dysuria, frequency and urgency.  Musculoskeletal:  Negative for back pain and joint pain.  Skin: Negative.  Negative for itching and rash.  Neurological:  Positive for tingling. Negative for dizziness, focal weakness, weakness and headaches.  Endo/Heme/Allergies:  Does not bruise/bleed easily.  Psychiatric/Behavioral:  Negative for depression. The patient is not nervous/anxious and does not have insomnia.      MEDICAL HISTORY:  Past Medical History:  Diagnosis Date   Appetite loss    Asthma    Cervical radiculopathy    Chemotherapy-induced nausea    Hiatal hernia    Hypercholesterolemia    Hyperlipidemia    Hypertension    IBS (irritable bowel syndrome)    Malignant neoplasm of upper-outer quadrant of female breast  (HCC) 04/11/2009   Right breast, invasive ductal carcinoma, 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 Arimidex therapy completed September 2015   Mild mitral valve prolapse    Neuropathy    feet, from chemo   Personal history of malignant neoplasm of breast 2010   Personal history of radiation therapy 2010   mammosite   T2DM (type 2 diabetes mellitus) (HCC) 2011    SURGICAL HISTORY: Past Surgical History:  Procedure Laterality Date   ABDOMINAL EXPLORATION SURGERY  2000   ovarian cyst   BREAST BIOPSY Right 2010   +    BREAST BIOPSY Left 04/06/2023   Left Breast Stereo Bx X Clip - path pending   BREAST BIOPSY Left 04/06/2023   Left Breast Stereo Bx, Coil Clip - path pending   BREAST BIOPSY Left 04/06/2023   MM LT BREAST BX W LOC DEV 1ST LESION IMAGE BX SPEC STEREO GUIDE 04/06/2023 [facility]   BREAST BIOPSY Left 04/06/2023   MM LT BREAST BX W LOC DEV EA AD LESION IMG BX SPEC STEREO GUIDE 04/06/2023 [facility]   BREAST EXCISIONAL BIOPSY Right 2010   + mammo site inasive mammo ca   BREAST LUMPECTOMY Right 2010   [facility]   COLONOSCOPY  07/19/2012   Normal exam, Dr. Lemar Livings   COLONOSCOPY WITH PROPOFOL N/A 04/21/2022  Procedure: COLONOSCOPY WITH PROPOFOL;  Surgeon: Earline Mayotte, MD;  Location: [facility] ENDOSCOPY;  Service: Endoscopy;  Laterality: N/A;   ESOPHAGOGASTRODUODENOSCOPY (EGD) WITH PROPOFOL N/A 10/22/2022   Procedure: ESOPHAGOGASTRODUODENOSCOPY (EGD) WITH PROPOFOL;  Surgeon: Midge Minium, MD;  Location: [facility];  Service: Endoscopy;  Laterality: N/A;  Diabetic   MASTECTOMY Right 2010   partial/ lumpectomy   PORTACATH PLACEMENT Left 05/19/2022   Procedure: INSERTION PORT-A-CATH;  Surgeon: Earline Mayotte, MD;  Location: [facility] ORS;  Service: General;  Laterality: Left;   SIMPLE MASTECTOMY WITH AXILLARY SENTINEL NODE BIOPSY Right 04/26/2022   Procedure: SIMPLE MASTECTOMY WITH AXILLARY SENTINEL NODE  BIOPSY;  Surgeon: Earline Mayotte, MD;  Location: [facility] ORS;  Service: General;  Laterality: Right;  RNFA to assist   TUBAL LIGATION  20 years ago    SOCIAL HISTORY: Social History   Socioeconomic History   Marital status: Married    Spouse name: Antonio   Number of children: 2   Years of education: Not on file   Highest education level: Not on file  Occupational History   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  Tobacco Use   Smoking status: Former    Current packs/day: 0.00    Average packs/day: 0.3 packs/day for 10.0 years (2.5 ttl pk-yrs)    Types: Cigarettes    Start date: 84    Quit date: 1988    Years since quitting: 36.7   Smokeless tobacco: Never   Tobacco comments:    smoking cessation materials not required  Vaping Use   Vaping status: Never Used  Substance and Sexual Activity   Alcohol use: Yes    Comment: wine occ   Drug use: No   Sexual activity: Not Currently    Comment: husband has ED  Other Topics Concern   Not on file  Social History Narrative   Oncology nurse on the floor retired.;  Husband retired from [employer].  No smoking.   Social Determinants of Health   Financial Resource Strain: Low Risk  (04/07/2023)   Overall Financial Resource Strain (CARDIA)    Difficulty of Paying Living Expenses: Not hard at all  Food Insecurity: No Food Insecurity (04/07/2023)   Hunger Vital Sign    Worried About Running Out of Food in the Last Year: Never true    Ran Out of Food in the Last Year: Never true  Transportation Needs: No Transportation Needs (04/07/2023)   PRAPARE - Administrator, Civil Service (Medical): No    Lack of Transportation (Non-Medical): No  Physical Activity: Insufficiently Active (04/07/2023)   Exercise Vital Sign    Days of Exercise per Week: 1 day    Minutes of Exercise per Session: 20 min  Stress: No Stress Concern Present (04/07/2023)   [employer] of Occupational Health - Occupational Stress Questionnaire     Feeling of Stress : Not at all  Social Connections: Moderately Integrated (04/07/2023)   Social Connection and Isolation Panel [NHANES]    Frequency of Communication with Friends and Family: More than three times a week    Frequency of Social Gatherings with Friends and Family: Three times a week    Attends Religious Services: More than 4 times per year    Active Member of Clubs or Organizations: No    Attends Banker Meetings: Never    Marital Status: Married  Catering manager Violence: Not At Risk (04/07/2023)   Humiliation, Afraid, Rape, and Kick questionnaire  Fear of Current or Ex-Partner: No    Emotionally Abused: No    Physically Abused: No    Sexually Abused: No    FAMILY HISTORY: Family History  Problem Relation Age of Onset   Osteoporosis Mother    Diabetes Father    Kidney disease Father    Diabetes Brother    Breast cancer Maternal Aunt    Bladder Cancer Maternal Aunt    Cervical cancer Maternal Aunt    Colon cancer Maternal Uncle    Lung cancer Maternal Uncle     ALLERGIES:  is allergic to levaquin [levofloxacin in d5w], losartan, metoprolol, saxagliptin, amlodipine-olmesartan, canagliflozin, egg white [egg white (egg protein)], gabapentin, glipizide, gluten meal, lisinopril, metformin and related, milk (cow), milk-related compounds, shrimp extract, tramadol, zyprexa [olanzapine], actos [pioglitazone], atorvastatin, carvedilol, and prednisone.  MEDICATIONS:  Current Outpatient Medications  Medication Sig Dispense Refill   aspirin EC 81 MG tablet Take 1 tablet (81 mg total) by mouth daily. 30 tablet 0   atenolol (TENORMIN) 25 MG tablet Take 1 tablet (25 mg total) by mouth daily. 30 tablet 0   betamethasone, augmented, (DIPROLENE) 0.05 % lotion Apply 1 application  topically 2 (two) times daily.     Blood Glucose Monitoring Suppl (ONETOUCH VERIO) w/Device KIT 1 Device by Does not apply route once. 1 kit 0   cholecalciferol (VITAMIN D) 1000 UNITS  tablet Take 1,000 Units by mouth daily.     Cyanocobalamin (VITAMIN B-12 PO) Take 1 tablet by mouth daily as needed (fatigue).     EPINEPHrine (EPIPEN 2-PAK) 0.3 mg/0.3 mL IJ SOAJ injection Inject 0.3 mg into the muscle as needed for anaphylaxis. 2 each 0   famotidine (PEPCID) 20 MG tablet Take 1 tablet (20 mg total) by mouth 2 (two) times daily. 180 tablet 1   glucose blood (ONETOUCH VERIO) test strip Use as instructed 100 each 12   Lancets (ONETOUCH ULTRASOFT) lancets Use as instructed 100 each 3   levocetirizine (XYZAL) 5 MG tablet TAKE 1 TABLET BY MOUTH EVERY DAY IN THE EVENING 30 tablet 0   montelukast (SINGULAIR) 10 MG tablet TAKE 1 TABLET BY MOUTH EVERYDAY AT BEDTIME 90 tablet 1   Multiple Vitamin (MULTIVITAMIN WITH MINERALS) TABS tablet Take 1 tablet by mouth 2 (two) times a week.     OZEMPIC, 0.25 OR 0.5 MG/DOSE, 2 MG/1.5ML SOPN Inject 0.5 mg into the skin every Sunday.     rosuvastatin (CRESTOR) 10 MG tablet Take 1 tablet (10 mg total) by mouth every morning. 90 tablet 1   spironolactone (ALDACTONE) 50 MG tablet Take 1 tablet (50 mg total) by mouth daily. 90 tablet 1   No current facility-administered medications for this visit.   Facility-Administered Medications Ordered in Other Visits  Medication Dose Route Frequency Provider Last Rate Last Admin   heparin lock flush 100 UNIT/ML injection            heparin lock flush 100 unit/mL  500 Units Intravenous Once Earna Coder, MD          .  PHYSICAL EXAMINATION: ECOG PERFORMANCE STATUS: 0 - Asymptomatic  Vitals:   09/21/23 1014  BP: (!) 118/59  Pulse: 65  Temp: (!) 97.2 F (36.2 C)  SpO2: 100%      Filed Weights   09/21/23 1014  Weight: 168 lb (76.2 kg)       Physical Exam Vitals and nursing note reviewed.  HENT:     Head: Normocephalic and atraumatic.     Mouth/Throat:  Pharynx: Oropharynx is clear.  Eyes:     Extraocular Movements: Extraocular movements intact.     Pupils: Pupils are equal,  round, and reactive to light.  Cardiovascular:     Rate and Rhythm: Normal rate and regular rhythm.  Pulmonary:     Comments: Decreased breath sounds bilaterally.  Abdominal:     Palpations: Abdomen is soft.  Musculoskeletal:        General: Normal range of motion.     Cervical back: Normal range of motion.  Skin:    General: Skin is warm.  Neurological:     General: No focal deficit present.     Mental Status: She is alert and oriented to person, place, and time.  Psychiatric:        Behavior: Behavior normal.        Judgment: Judgment normal.      LABORATORY DATA:  I have reviewed the data as listed Lab Results  Component Value Date   WBC 4.5 09/21/2023   HGB 11.8 (L) 09/21/2023   HCT 37.0 09/21/2023   MCV 94.6 09/21/2023   PLT 195 09/21/2023   Recent Labs    05/31/23 1018 06/21/23 0931 09/21/23 1006  NA 140 136 136  K 4.1 4.2 4.7  CL 106 103 102  CO2 24 24 25   GLUCOSE 110* 89 101*  BUN 16 22 13   CREATININE 0.94 1.04* 0.95  CALCIUM 9.3 9.2 9.4  GFRNONAA >60 57* >60  PROT 7.4 7.3 7.2  ALBUMIN 3.8 3.6 3.7  AST 16 17 17   ALT 15 14 14   ALKPHOS 84 81 83  BILITOT 0.5 0.5 0.5    RADIOGRAPHIC STUDIES: I have personally reviewed the radiological images as listed and agreed with the findings in the report. No results found.  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  Patient currently on adjuvant chemoimmunotherapy [KEYNOTE 522 study]; patient s/p carboplatin weekly Taxol.  Currently on Adriamycin-Cytoxan Keytruda-s/p 4 cycles [cycle #4 reduced to Adriamycin to 40 mg/m2].   S/p adjuvant Keytruda #9  out of planned 9 cycles [finished in JULy 2024].   #left breast- mammogram-  [Dr.Sakai] There are two separate 10 mm groups of coarse heterogeneous calcification in the left breast that are low suspicion for  malignancy. S/p  stereotactic guided biopsy- negative. 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 Sep 2023- vit D  40. Continue ca+vit D. stable  # DM- on  ozempic.  Monitor blood sugars closely. stable  # Hypokalemia- improved; ok to stop Kdur.  Stable  # Bone health- check BMD   # IV access- s/p  Explantation-PIV.   # DISPOSITION: # follow up in 6 month-  MD; labs- cbc/cmp; Thyroid profile; BMD prior- Dr.B   All questions were answered. The patient/family knows to call the clinic with any problems, questions or concerns.    Earna Coder, MD 09/21/2023 11:19 AM

## 2023-09-21 NOTE — Assessment & Plan Note (Addendum)
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  Patient currently on adjuvant chemoimmunotherapy [KEYNOTE 522 study]; patient s/p carboplatin weekly Taxol.  Currently on Adriamycin-Cytoxan Keytruda-s/p 4 cycles [cycle #4 reduced to Adriamycin to 40 mg/m2].   S/p adjuvant Keytruda #9  out of planned 9 cycles [finished in JULy 2024].   #left breast- mammogram-  [Dr.Sakai] There are two separate 10 mm groups of coarse heterogeneous calcification in the left breast that are low suspicion for malignancy. S/p  stereotactic guided biopsy- negative. 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 Sep 2023- vit D  40. Continue ca+vit D. stable  # DM- on  ozempic.  Monitor blood sugars closely. stable  # Hypokalemia- improved; ok to stop Kdur.  Stable  # Bone health- check BMD   # IV access- s/p  Explantation-PIV.   # DISPOSITION: # follow up in 6 month-  MD; labs- cbc/cmp; Thyroid profile; BMD prior- Dr.B

## 2023-10-06 ENCOUNTER — Other Ambulatory Visit: Payer: Self-pay | Admitting: Family Medicine

## 2023-10-06 DIAGNOSIS — K219 Gastro-esophageal reflux disease without esophagitis: Secondary | ICD-10-CM

## 2023-10-10 NOTE — Progress Notes (Unsigned)
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 She is willing to try Lyrica. Accupunture helped a little but she is not longer going.   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 She saw allergist and states further test was negative. 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 She had mastectomy done by Dr. Birdie Sons and is now getting chemotherapy / Oncologist is Dr. Donneta Romberg, finished chemotherapy Dec 2023 , currently taking Kegyruda total of 9 cycles should be done in July she is going to see Dr. Kizzie Bane in 6 months. Mammogram is up to date. Her current surgeon is Dr. Tonna Boehringer   DM type II with dyslipidemia. : she is seeing  Endocrinologist Dr. Gershon Crane , last A1C was done 08/2023 and it was down to 6.2 %  She has associated dyslipidemia and obesity. Denies polyphagia, polydipsia or polyuria . Continue Crestor for dyslipidemia, allergic to ACE/ARB.    Migraine headaches: she was seen by Dr. Sherryll Burger, had labs done in May 22, and MRI was unremarkable. 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 She was off Miralax for a while and controlling with increase in fiber in her diet, however since chemotherapy she has noticed  that constipation increased again and some abdominal pain, so she has resumed miralax daily and Sennokot at night. She has urgency but still needs to strain , we will give her Linzess   Thyroid nodule: monitored by Dr. Turner Daniels, last TSH normal , continue regular follow ups.  She denies change in  bowel movements    Carotid atherosclerosis: found on C-spine x-ray, she has a pulsating mass on right side of neck . She had doppler done    IMPRESSION: 12/2021 carotid doppler  No evidence of hemodynamically significant stenosis involving either the right or left carotid circulation in the neck by Doppler criteria. Minimal plaque in the right carotid bulb results in less than 50% stenosis. No significant plaque appreciated in the left carotid circulation.    Intermittent low back pain: dull aching, sometimes burning, no radiculitis. Stable  Patient Active Problem List   Diagnosis Date Noted   Melena 10/22/2022   Other diseases of stomach and duodenum 10/22/2022   Chemotherapy-induced neuropathy (HCC) 08/27/2022   Bilateral lower extremity edema 08/27/2022   Hives 08/27/2022   Food allergy 08/05/2022   Atherosclerosis of both carotid arteries 08/05/2022   Migraine aura without headache 08/05/2022   Carcinoma of upper-outer quadrant of right breast in female, estrogen receptor negative (HCC) 05/26/2022   Breast cancer (HCC) 05/10/2022   Goals of care, counseling/discussion 05/10/2022   Encounter for monitoring cardiotoxic drug therapy 05/10/2022   Carotid stenosis 04/21/2022  Hypertension associated with type 2 diabetes mellitus (HCC) 01/20/2021   Intermittent low back pain 08/18/2017   Irritable bowel syndrome with constipation 05/02/2017   Vitamin D deficiency 09/09/2016   Hiatal hernia 06/02/2016   Allergic rhinitis, seasonal 06/02/2016   Obesity (BMI 30-39.9) 04/30/2016   Angio-edema 04/30/2016   Hyperlipidemia 11/04/2015   Hyperlipidemia due to type 2 diabetes mellitus (HCC)  11/04/2015   Cervical radiculopathy at C6 07/31/2015   Neuroma digital nerve 07/31/2015   Dyslipidemia associated with type 2 diabetes mellitus (HCC) 06/30/2015   Cervical disc disease 06/30/2015   Essential hypertension 06/30/2015   History of breast cancer in female 03/23/2009    Past Surgical History:  Procedure Laterality Date   ABDOMINAL EXPLORATION SURGERY  2000   ovarian cyst   BREAST BIOPSY Right 2010   +    BREAST BIOPSY Left 04/06/2023   Left Breast Stereo Bx X Clip - path pending   BREAST BIOPSY Left 04/06/2023   Left Breast Stereo Bx, Coil Clip - path pending   BREAST BIOPSY Left 04/06/2023   MM LT BREAST BX W LOC DEV 1ST LESION IMAGE BX SPEC STEREO GUIDE 04/06/2023 [facility]   BREAST BIOPSY Left 04/06/2023   MM LT BREAST BX W LOC DEV EA AD LESION IMG BX SPEC STEREO GUIDE 04/06/2023 [facility]   BREAST EXCISIONAL BIOPSY Right 2010   + mammo site inasive mammo ca   BREAST LUMPECTOMY Right 2010   [facility]   COLONOSCOPY  07/19/2012   Normal exam, Dr. Lemar Livings   COLONOSCOPY WITH PROPOFOL N/A 04/21/2022   Procedure: COLONOSCOPY WITH PROPOFOL;  Surgeon: Earline Mayotte, MD;  Location: [facility];  Service: Endoscopy;  Laterality: N/A;   ESOPHAGOGASTRODUODENOSCOPY (EGD) WITH PROPOFOL N/A 10/22/2022   Procedure: ESOPHAGOGASTRODUODENOSCOPY (EGD) WITH PROPOFOL;  Surgeon: Midge Minium, MD;  Location: [facility];  Service: Endoscopy;  Laterality: N/A;  Diabetic   MASTECTOMY Right 2010   partial/ lumpectomy   PORTACATH PLACEMENT Left 05/19/2022   Procedure: INSERTION PORT-A-CATH;  Surgeon: Earline Mayotte, MD;  Location: [facility];  Service: General;  Laterality: Left;   SIMPLE MASTECTOMY WITH AXILLARY SENTINEL NODE BIOPSY Right 04/26/2022   Procedure: SIMPLE MASTECTOMY WITH AXILLARY SENTINEL NODE BIOPSY;  Surgeon: Earline Mayotte, MD;  Location: [facility];  Service: General;  Laterality: Right;  RNFA to assist   TUBAL LIGATION  20 years ago    Family  History  Problem Relation Age of Onset   Osteoporosis Mother    Diabetes Father    Kidney disease Father    Diabetes Brother    Breast cancer Maternal Aunt    Bladder Cancer Maternal Aunt    Cervical cancer Maternal Aunt    Colon cancer Maternal Uncle    Lung cancer Maternal Uncle     Social History   Tobacco Use   Smoking status: Former    Current packs/day: 0.00    Average packs/day: 0.3 packs/day for 10.0 years (2.5 ttl pk-yrs)    Types: Cigarettes    Start date: 50    Quit date: 1988    Years since quitting: 36.8   Smokeless tobacco: Never   Tobacco comments:    smoking cessation materials not required  Substance Use Topics   Alcohol use: Yes    Comment: wine occ     Current Outpatient Medications:    aspirin EC 81 MG tablet, Take 1 tablet (81 mg total) by mouth daily., Disp: 30 tablet, Rfl: 0   atenolol (TENORMIN) 25 MG tablet,  Take 1 tablet (25 mg total) by mouth daily., Disp: 30 tablet, Rfl: 0   betamethasone, augmented, (DIPROLENE) 0.05 % lotion, Apply 1 application  topically 2 (two) times daily., Disp: , Rfl:    Blood Glucose Monitoring Suppl (ONETOUCH VERIO) w/Device KIT, 1 Device by Does not apply route once., Disp: 1 kit, Rfl: 0   cholecalciferol (VITAMIN D) 1000 UNITS tablet, Take 1,000 Units by mouth daily., Disp: , Rfl:    Cyanocobalamin (VITAMIN B-12 PO), Take 1 tablet by mouth daily as needed (fatigue)., Disp: , Rfl:    EPINEPHrine (EPIPEN 2-PAK) 0.3 mg/0.3 mL IJ SOAJ injection, Inject 0.3 mg into the muscle as needed for anaphylaxis., Disp: 2 each, Rfl: 0   famotidine (PEPCID) 20 MG tablet, Take 1 tablet (20 mg total) by mouth 2 (two) times daily., Disp: 180 tablet, Rfl: 1   glucose blood (ONETOUCH VERIO) test strip, Use as instructed, Disp: 100 each, Rfl: 12   Lancets (ONETOUCH ULTRASOFT) lancets, Use as instructed, Disp: 100 each, Rfl: 3   levocetirizine (XYZAL) 5 MG tablet, TAKE 1 TABLET BY MOUTH EVERY DAY IN THE EVENING, Disp: 30 tablet, Rfl: 0    montelukast (SINGULAIR) 10 MG tablet, TAKE 1 TABLET BY MOUTH EVERYDAY AT BEDTIME, Disp: 90 tablet, Rfl: 1   Multiple Vitamin (MULTIVITAMIN WITH MINERALS) TABS tablet, Take 1 tablet by mouth 2 (two) times a week., Disp: , Rfl:    OZEMPIC, 0.25 OR 0.5 MG/DOSE, 2 MG/1.5ML SOPN, Inject 0.5 mg into the skin every Sunday., Disp: , Rfl:    rosuvastatin (CRESTOR) 10 MG tablet, Take 1 tablet (10 mg total) by mouth every morning., Disp: 90 tablet, Rfl: 1   spironolactone (ALDACTONE) 50 MG tablet, Take 1 tablet (50 mg total) by mouth daily., Disp: 90 tablet, Rfl: 1 No current facility-administered medications for this visit.  Facility-Administered Medications Ordered in Other Visits:    heparin lock flush 100 UNIT/ML injection, , , ,    heparin lock flush 100 unit/mL, 500 Units, Intravenous, Once, Brahmanday, Worthy Flank, MD  Allergies  Allergen Reactions   Levaquin [Levofloxacin In D5w] Swelling    Other reaction(s): Arthralgia (Joint Pain)   Losartan     Other reaction(s): Angioedema   Metoprolol Swelling    Lip and tongue tingling and numbness   Saxagliptin Other (See Comments)    Increased PVC's  Other reaction(s): irregular heart rate   Amlodipine-Olmesartan    Canagliflozin Other (See Comments)    Bladder pain   Egg White [Egg White (Egg Protein)]     Unknown reaction   Gabapentin     cns side effects.   Glipizide     Other reaction(s): Abdominal pain   Gluten Meal    Lisinopril Swelling    Angioedema   Metformin And Related Nausea Only   Milk (Cow)    Milk-Related Compounds    Shrimp Extract     Other reaction(s): Other (See Comments)   Tramadol Nausea And Vomiting   Zyprexa [Olanzapine]    Actos [Pioglitazone] Other (See Comments) and Nausea And Vomiting    Bladder pain   Atorvastatin Other (See Comments)    Other reaction(s): Abdominal Pain, Other (See Comments)   Carvedilol Other (See Comments)    NUMBNESS, TINGLING, ACHING IN ARMS   Prednisone Palpitations    I  personally reviewed active problem list, medication list, allergies, family history, social history, health maintenance with the patient/caregiver today.   ROS  Ten systems reviewed and is negative except as mentioned in HPI  Objective  Vitals:   10/11/23 0945  BP: 126/70  Pulse: 90  Resp: 16  SpO2: 97%  Weight: 168 lb (76.2 kg)  Height: 5\' 6"  (1.676 m)    Body mass index is 27.12 kg/m.  Physical Exam  Constitutional: Patient appears well-developed and well-nourished.  No distress.  HEENT: head atraumatic, normocephalic, pupils equal and reactive to light, neck supple Cardiovascular: Normal rate, regular rhythm and normal heart sounds.  No murmur heard. No BLE edema. Pulmonary/Chest: Effort normal and breath sounds normal. No respiratory distress. Abdominal: Soft.  There is no tenderness. Psychiatric: Patient has a normal mood and affect. behavior is normal. Judgment and thought content normal.    PHQ2/9:    10/11/2023    9:45 AM 04/12/2023    9:55 AM 04/07/2023   10:45 AM 08/27/2022    3:35 PM 08/05/2022   11:37 AM  Depression screen PHQ 2/9  Decreased Interest 0 0 0 0 0  Down, Depressed, Hopeless 0 0 0 0 0  PHQ - 2 Score 0 0 0 0 0  Altered sleeping 0 0  0 0  Tired, decreased energy 0 0  1 3  Change in appetite 0 0  0 0  Feeling bad or failure about yourself  0 0  0 0  Trouble concentrating 0 0  0 0  Moving slowly or fidgety/restless 0 0  0 0  Suicidal thoughts 0 0  0 0  PHQ-9 Score 0 0  1 3    phq 9 is negative   Fall Risk:    10/11/2023    9:45 AM 04/12/2023    9:55 AM 04/07/2023   10:40 AM 08/27/2022    3:35 PM 08/05/2022   11:36 AM  Fall Risk   Falls in the past year? 0 0 0 0 0  Number falls in past yr: 0 0 0  0  Injury with Fall? 0 0 0  0  Risk for fall due to : No Fall Risks No Fall Risks No Fall Risks No Fall Risks No Fall Risks  Follow up Falls prevention discussed Falls prevention discussed Education provided;Falls prevention discussed Falls  prevention discussed;Education provided;Falls evaluation completed Falls prevention discussed      Functional Status Survey: Is the patient deaf or have difficulty hearing?: No Does the patient have difficulty seeing, even when wearing glasses/contacts?: No Does the patient have difficulty concentrating, remembering, or making decisions?: No Does the patient have difficulty walking or climbing stairs?: No Does the patient have difficulty dressing or bathing?: No Does the patient have difficulty doing errands alone such as visiting a doctor's office or shopping?: No    Assessment & Plan  1. Need for immunization against influenza  - Flu Vaccine Trivalent High Dose (Fluad)  2. Chemotherapy-induced neuropathy (HCC)  She could not tolerate Gabapentin, discussed lyrica and she is willing to try it  3. History of breast cancer in female  Under the hematologist   4. GERD without esophagitis  - famotidine (PEPCID) 20 MG tablet; Take 1 tablet (20 mg total) by mouth 2 (two) times daily.  Dispense: 180 tablet; Refill: 1  5. Dyslipidemia associated with type 2 diabetes mellitus (HCC)  - rosuvastatin (CRESTOR) 20 MG tablet; Take 1 tablet (20 mg total) by mouth every morning.  Dispense: 90 tablet; Refill: 1  6. Atherosclerosis of both carotid arteries  On statin therapy   7. Intermittent low back pain  stable  8. Migraine aura without headache  stable  9. Irritable  bowel syndrome with constipation  - lubiprostone (AMITIZA) 8 MCG capsule; Take 1 capsule (8 mcg total) by mouth 2 (two) times daily with a meal.  Dispense: 60 capsule; Refill: 0  10. Essential hypertension  - spironolactone (ALDACTONE) 50 MG tablet; Take 1 tablet (50 mg total) by mouth daily.  Dispense: 90 tablet; Refill: 1 - atenolol (TENORMIN) 25 MG tablet; Take 1 tablet (25 mg total) by mouth daily.  Dispense: 90 tablet; Refill: 1  11. Seasonal allergic rhinitis, unspecified trigger  - montelukast  (SINGULAIR) 10 MG tablet; TAKE 1 TABLET BY MOUTH EVERYDAY AT BEDTIME  Dispense: 90 tablet; Refill: 1 - levocetirizine (XYZAL) 5 MG tablet; Take 1 tablet (5 mg total) by mouth daily.  Dispense: 90 tablet; Refill: 1 - azelastine (ASTELIN) 0.1 % nasal spray; Place 1 spray into both nostrils 2 (two) times daily. Use in each nostril as directed  Dispense: 90 mL; Refill: 1  12. Hives  - montelukast (SINGULAIR) 10 MG tablet; TAKE 1 TABLET BY MOUTH EVERYDAY AT BEDTIME  Dispense: 90 tablet; Refill: 1

## 2023-10-11 ENCOUNTER — Encounter: Payer: Self-pay | Admitting: Family Medicine

## 2023-10-11 ENCOUNTER — Ambulatory Visit (INDEPENDENT_AMBULATORY_CARE_PROVIDER_SITE_OTHER): Payer: Medicare Other | Admitting: Family Medicine

## 2023-10-11 VITALS — BP 126/70 | HR 90 | Resp 16 | Ht 66.0 in | Wt 168.0 lb

## 2023-10-11 DIAGNOSIS — K581 Irritable bowel syndrome with constipation: Secondary | ICD-10-CM | POA: Diagnosis not present

## 2023-10-11 DIAGNOSIS — Z23 Encounter for immunization: Secondary | ICD-10-CM

## 2023-10-11 DIAGNOSIS — K219 Gastro-esophageal reflux disease without esophagitis: Secondary | ICD-10-CM

## 2023-10-11 DIAGNOSIS — G620 Drug-induced polyneuropathy: Secondary | ICD-10-CM | POA: Diagnosis not present

## 2023-10-11 DIAGNOSIS — E1169 Type 2 diabetes mellitus with other specified complication: Secondary | ICD-10-CM | POA: Diagnosis not present

## 2023-10-11 DIAGNOSIS — I6523 Occlusion and stenosis of bilateral carotid arteries: Secondary | ICD-10-CM

## 2023-10-11 DIAGNOSIS — J302 Other seasonal allergic rhinitis: Secondary | ICD-10-CM

## 2023-10-11 DIAGNOSIS — Z853 Personal history of malignant neoplasm of breast: Secondary | ICD-10-CM | POA: Diagnosis not present

## 2023-10-11 DIAGNOSIS — G43109 Migraine with aura, not intractable, without status migrainosus: Secondary | ICD-10-CM | POA: Diagnosis not present

## 2023-10-11 DIAGNOSIS — I10 Essential (primary) hypertension: Secondary | ICD-10-CM

## 2023-10-11 DIAGNOSIS — M545 Low back pain, unspecified: Secondary | ICD-10-CM

## 2023-10-11 DIAGNOSIS — L509 Urticaria, unspecified: Secondary | ICD-10-CM | POA: Diagnosis not present

## 2023-10-11 MED ORDER — LEVOCETIRIZINE DIHYDROCHLORIDE 5 MG PO TABS: 5.0000 mg | ORAL_TABLET | Freq: Every day | ORAL | 1 refills | Status: DC

## 2023-10-11 MED ORDER — ROSUVASTATIN CALCIUM 20 MG PO TABS
20.0000 mg | ORAL_TABLET | ORAL | 1 refills | Status: DC
Start: 2023-10-11 — End: 2024-03-13

## 2023-10-11 MED ORDER — AZELASTINE HCL 0.1 % NA SOLN: 1.0000 | Freq: Two times a day (BID) | NASAL | 1 refills | Status: DC

## 2023-10-11 MED ORDER — LUBIPROSTONE 8 MCG PO CAPS
8.0000 ug | ORAL_CAPSULE | Freq: Two times a day (BID) | ORAL | 0 refills | Status: DC
Start: 2023-10-11 — End: 2023-11-02

## 2023-10-11 MED ORDER — MONTELUKAST SODIUM 10 MG PO TABS: ORAL_TABLET | ORAL | 1 refills | Status: DC

## 2023-10-11 MED ORDER — FAMOTIDINE 20 MG PO TABS: 20.0000 mg | ORAL_TABLET | Freq: Two times a day (BID) | ORAL | 1 refills | Status: DC

## 2023-10-11 MED ORDER — ATENOLOL 25 MG PO TABS: 25.0000 mg | ORAL_TABLET | Freq: Every day | ORAL | 1 refills | Status: DC

## 2023-10-11 MED ORDER — PREGABALIN 50 MG PO CAPS: 50.0000 mg | ORAL_CAPSULE | Freq: Three times a day (TID) | ORAL | 0 refills | Status: DC

## 2023-10-11 MED ORDER — SPIRONOLACTONE 50 MG PO TABS
50.0000 mg | ORAL_TABLET | Freq: Every day | ORAL | 1 refills | Status: DC
Start: 2023-10-11 — End: 2024-01-27

## 2023-10-12 ENCOUNTER — Other Ambulatory Visit: Payer: Self-pay

## 2023-10-27 ENCOUNTER — Other Ambulatory Visit: Payer: Self-pay | Admitting: Family Medicine

## 2023-10-27 DIAGNOSIS — E1169 Type 2 diabetes mellitus with other specified complication: Secondary | ICD-10-CM

## 2023-11-02 ENCOUNTER — Other Ambulatory Visit: Payer: Self-pay | Admitting: Family Medicine

## 2023-11-02 DIAGNOSIS — K581 Irritable bowel syndrome with constipation: Secondary | ICD-10-CM

## 2023-11-03 ENCOUNTER — Encounter: Payer: Medicare Other | Attending: Internal Medicine

## 2023-11-03 VITALS — Ht 66.0 in | Wt 170.0 lb

## 2023-11-03 DIAGNOSIS — C50411 Malignant neoplasm of upper-outer quadrant of right female breast: Secondary | ICD-10-CM

## 2023-11-03 NOTE — Progress Notes (Signed)
Daily Session Note  Patient Details  Name: Morgan Peterson MRN: [id number] Date of Birth: 1951/11/28 Referring Provider:   Doristine Devoid [facility] & Exercise from 11/03/2023 in [facility]  Referring Provider Louretta Shorten, MD       Encounter Date: 11/03/2023  Check In:  Session Check In - 11/03/23 1710       Check-In   Supervising physician immediately available to respond to emergencies See telemetry face sheet for immediately available ER MD    Location [facility]    Staff Present Rory Percy, MS, Exercise Physiologist;Maxon Conetta BS, , Exercise Physiologist    Virtual Visit No    Medication changes reported     No    Fall or balance concerns reported    No    Warm-up and Cool-down Performed on first and last piece of equipment   and then 1 machine   Resistance Training Performed Yes    VAD Patient? No    PAD/SET Patient? No      Pain Assessment   Currently in Pain? No/denies    Multiple Pain Sites No               Exercise Prescription Changes - 11/03/23 1700       Response to Exercise   Blood Pressure (Admit) 106/64    Blood Pressure (Exercise) 130/64    Blood Pressure (Exit) 100/60    Heart Rate (Admit) 73 bpm    Heart Rate (Exercise) 107 bpm    Heart Rate (Exit) 76 bpm    Oxygen Saturation (Admit) 97 %    Oxygen Saturation (Exercise) 97 %    Oxygen Saturation (Exit) 97 %    Rating of Perceived Exertion (Exercise) 11    Perceived Dyspnea (Exercise) 0    Symptoms Numbness in feet (Neuropathy)    Comments results    Duration Progress to 30 minutes of  aerobic without signs/symptoms of physical distress    Intensity THRR New      Progression   Progression Continue to progress workloads to maintain intensity without signs/symptoms of physical distress.    Average METs 2.4             Social History   Tobacco Use  Smoking Status Former   Current packs/day:  0.00   Average packs/day: 0.3 packs/day for 10.0 years (2.5 ttl pk-yrs)   Types: Cigarettes   Start date: 59   Quit date: 35   Years since quitting: 36.8  Smokeless Tobacco Never  Tobacco Comments   smoking cessation materials not required    Goals Met:  Independence with exercise equipment Exercise tolerated well No report of concerns or symptoms today  Goals Unmet:  Not Applicable  Comments: First full day of exercise!  Patient was oriented to gym and equipment including functions, settings, policies, and procedures.  Patient's individual exercise prescription and treatment plan were reviewed.  All starting workloads were established based on the results of the 6 minute walk test done at initial orientation visit.  The plan for exercise progression was also introduced and progression will be customized based on patient's performance and goals.  6 Minute Walk     Row Name 11/03/23 1711         6 Minute Walk   Phase Initial     Distance 1050 feet     Walk Time 6 minutes     # of Rest Breaks 0  MPH 2     METS 2.4     RPE 11     Perceived Dyspnea  0     VO2 Peak 8.39     Symptoms Yes (comment)     Comments Numbness in feet (Neuropathy)     Resting HR 73 bpm     Resting BP 106/64     Resting Oxygen Saturation  97 %     Exercise Oxygen Saturation  during 6 min walk 97 %     Max Ex. HR 107 bpm     Max Ex. BP 130/64     2 Minute Post BP 106/64                Dr. Bethann Punches is Medical Director for [facility] Cardiac Rehabilitation.  Dr. Vida Rigger is Medical Director for [facility] Pulmonary Rehabilitation.

## 2023-11-03 NOTE — Patient Instructions (Signed)
Patient Instructions  Patient Details  Name: Morgan Peterson MRN: [id number] Date of Birth: 1951-08-14 Referring Provider:  Earna Coder, *  Below are your personal goals for exercise, nutrition, and risk factors. Our goal is to help you stay on track towards obtaining and maintaining these goals. We will be discussing your progress on these goals with you throughout the program.  Initial Exercise Prescription:  Initial Exercise Prescription - 11/03/23 1700       Date of Initial Exercise RX and Referring Provider   Date 11/03/23    Referring Provider Louretta Shorten, MD      Oxygen   Maintain Oxygen Saturation 88% or higher      NuStep   Level 2    SPM 80    Minutes 15    METs 2.4      REL-XR   Level 2    Speed 50    Minutes 15    METs 2.4      Track   Laps 26    Minutes 15    METs 2.41      Prescription Details   Frequency (times per week) 2    Duration Progress to 30 minutes of continuous aerobic without signs/symptoms of physical distress      Intensity   THRR 40-80% of Max Heartrate 103-133    Ratings of Perceived Exertion 11-13    Perceived Dyspnea 0-4      Progression   Progression Continue to progress workloads to maintain intensity without signs/symptoms of physical distress.      Resistance Training   Training Prescription Yes    Weight 3 lb    Reps 10-15             Exercise Goals: Frequency: Be able to perform aerobic exercise two to three times per week in program working toward 2-5 days per week of home exercise.  Intensity: Work with a perceived exertion of 11 (fairly light) - 15 (hard) while following your exercise prescription.  We will make changes to your prescription with you as you progress through the program.   Duration: Be able to do 30 to 45 minutes of continuous aerobic exercise in addition to a 5 minute warm-up and a 5 minute cool-down routine.   Nutrition Goals: Your personal nutrition goals will be  established when you do your nutrition analysis with the dietician.  The following are general nutrition guidelines to follow: Cholesterol < 200mg /day Sodium < 1500mg /day Fiber: Women over 50 yrs - 21 grams per day  Exercise Goals and Review:  Exercise Goals     Row Name 11/03/23 1719             Exercise Goals   Increase Physical Activity Yes       Intervention Provide advice, education, support and counseling about physical activity/exercise needs.;Develop an individualized exercise prescription for aerobic and resistive training based on initial evaluation findings, risk stratification, comorbidities and participant's personal goals.       Expected Outcomes Short Term: Attend rehab on a regular basis to increase amount of physical activity.;Long Term: Add in home exercise to make exercise part of routine and to increase amount of physical activity.;Long Term: Exercising regularly at least 3-5 days a week.       Increase Strength and Stamina Yes       Intervention Provide advice, education, support and counseling about physical activity/exercise needs.;Develop an individualized exercise prescription for aerobic and resistive training based on initial evaluation findings, risk  stratification, comorbidities and participant's personal goals.       Expected Outcomes Short Term: Increase workloads from initial exercise prescription for resistance, speed, and METs.;Short Term: Perform resistance training exercises routinely during rehab and add in resistance training at home;Long Term: Improve cardiorespiratory fitness, muscular endurance and strength as measured by increased METs and functional capacity ( )       Able to understand and use rate of perceived exertion (RPE) scale Yes       Intervention Provide education and explanation on how to use RPE scale       Expected Outcomes Short Term: Able to use RPE daily in rehab to express subjective intensity level;Long Term:  Able to use RPE to  guide intensity level when exercising independently       Able to understand and use Dyspnea scale Yes       Intervention Provide education and explanation on how to use Dyspnea scale       Expected Outcomes Short Term: Able to use Dyspnea scale daily in rehab to express subjective sense of shortness of breath during exertion;Long Term: Able to use Dyspnea scale to guide intensity level when exercising independently       Knowledge and understanding of Target Heart Rate Range (THRR) Yes       Intervention Provide education and explanation of THRR including how the numbers were predicted and where they are located for reference       Expected Outcomes Short Term: Able to state/look up THRR;Short Term: Able to use daily as guideline for intensity in rehab;Long Term: Able to use THRR to govern intensity when exercising independently       Able to check pulse independently Yes       Intervention Provide education and demonstration on how to check pulse in carotid and radial arteries.;Review the importance of being able to check your own pulse for safety during independent exercise       Expected Outcomes Short Term: Able to explain why pulse checking is important during independent exercise;Long Term: Able to check pulse independently and accurately       Understanding of Exercise Prescription Yes       Intervention Provide education, explanation, and written materials on patient's individual exercise prescription       Expected Outcomes Short Term: Able to explain program exercise prescription;Long Term: Able to explain home exercise prescription to exercise independently

## 2023-11-10 DIAGNOSIS — E119 Type 2 diabetes mellitus without complications: Secondary | ICD-10-CM | POA: Diagnosis not present

## 2023-11-10 DIAGNOSIS — I341 Nonrheumatic mitral (valve) prolapse: Secondary | ICD-10-CM | POA: Diagnosis not present

## 2023-11-10 DIAGNOSIS — I10 Essential (primary) hypertension: Secondary | ICD-10-CM | POA: Diagnosis not present

## 2023-11-10 DIAGNOSIS — Z9221 Personal history of antineoplastic chemotherapy: Secondary | ICD-10-CM | POA: Diagnosis not present

## 2023-11-10 DIAGNOSIS — I499 Cardiac arrhythmia, unspecified: Secondary | ICD-10-CM | POA: Diagnosis not present

## 2023-11-10 DIAGNOSIS — R001 Bradycardia, unspecified: Secondary | ICD-10-CM | POA: Diagnosis not present

## 2023-11-10 DIAGNOSIS — R002 Palpitations: Secondary | ICD-10-CM | POA: Diagnosis not present

## 2023-11-10 DIAGNOSIS — R5382 Chronic fatigue, unspecified: Secondary | ICD-10-CM | POA: Diagnosis not present

## 2023-11-10 DIAGNOSIS — E782 Mixed hyperlipidemia: Secondary | ICD-10-CM | POA: Diagnosis not present

## 2023-11-10 DIAGNOSIS — Z171 Estrogen receptor negative status [ER-]: Secondary | ICD-10-CM

## 2023-11-10 LAB — GLUCOSE, CAPILLARY
Glucose-Capillary: 120 mg/dL — ABNORMAL HIGH (ref 70–99)
Glucose-Capillary: 97 mg/dL (ref 70–99)

## 2023-11-10 NOTE — Progress Notes (Signed)
Daily Session Note  Patient Details  Name: Morgan Peterson MRN: [id number] Date of Birth: 17-Aug-1951 Referring Provider:   Doristine Devoid Cancer Associated Rehabilitation & Exercise from 11/03/2023 in [facility] Cardiac and Pulmonary Rehab  Referring Provider Louretta Shorten, MD       Encounter Date: 11/10/2023  Check In:  Session Check In - 11/10/23 1609       Check-In   Supervising physician immediately available to respond to emergencies See telemetry face sheet for immediately available ER MD    Location [facility]-Cardiac & Pulmonary Rehab    Staff Present Rory Percy, MS, Exercise Physiologist;Maxon Conetta BS, , Exercise Physiologist    Virtual Visit No    Medication changes reported     Yes    Comments Stopped taking BP medication    Fall or balance concerns reported    No    Warm-up and Cool-down Performed on first and last piece of equipment    Resistance Training Performed Yes    VAD Patient? No    PAD/SET Patient? No      Pain Assessment   Currently in Pain? No/denies    Multiple Pain Sites No                Social History   Tobacco Use  Smoking Status Former   Current packs/day: 0.00   Average packs/day: 0.3 packs/day for 10.0 years (2.5 ttl pk-yrs)   Types: Cigarettes   Start date: 37   Quit date: 5   Years since quitting: 36.9  Smokeless Tobacco Never  Tobacco Comments   smoking cessation materials not required    Goals Met:  Independence with exercise equipment Exercise tolerated well No report of concerns or symptoms today  Goals Unmet:  Not Applicable  Comments: Pt able to follow exercise prescription today without complaint.  Will continue to monitor for progression.   Dr. Bethann Punches is Medical Director for [facility] Cardiac Rehabilitation.  Dr. Vida Rigger is Medical Director for [facility] Pulmonary Rehabilitation.

## 2023-11-11 ENCOUNTER — Other Ambulatory Visit: Payer: Self-pay | Admitting: Family Medicine

## 2023-11-11 DIAGNOSIS — G620 Drug-induced polyneuropathy: Secondary | ICD-10-CM

## 2023-11-11 DIAGNOSIS — I10 Essential (primary) hypertension: Secondary | ICD-10-CM

## 2023-11-15 DIAGNOSIS — C50411 Malignant neoplasm of upper-outer quadrant of right female breast: Secondary | ICD-10-CM

## 2023-11-15 LAB — GLUCOSE, CAPILLARY: Glucose-Capillary: 118 mg/dL — ABNORMAL HIGH (ref 70–99)

## 2023-11-15 NOTE — Progress Notes (Signed)
Daily Session Note  Patient Details  Name: Morgan Peterson MRN: [id number] Date of Birth: Jan 01, 1951 Referring Provider:   Doristine Devoid Cancer Associated Rehabilitation & Exercise from 11/03/2023 in [facility] Cardiac and Pulmonary Rehab  Referring Provider Louretta Shorten, MD       Encounter Date: 11/15/2023  Check In:  Session Check In - 11/15/23 1233       Check-In   Supervising physician immediately available to respond to emergencies See telemetry face sheet for immediately available ER MD    Location [facility]Cardiac & Pulmonary Rehab    Staff Present Rory Percy, MS, Exercise Physiologist;Kaarin Pardy, BS, Exercise Physiologist    Virtual Visit No    Medication changes reported     No    Fall or balance concerns reported    No    Warm-up and Cool-down Performed on first and last piece of equipment    Resistance Training Performed Yes    VAD Patient? No    PAD/SET Patient? No      Pain Assessment   Currently in Pain? No/denies    Multiple Pain Sites No                Social History   Tobacco Use  Smoking Status Former   Current packs/day: 0.00   Average packs/day: 0.3 packs/day for 10.0 years (2.5 ttl pk-yrs)   Types: Cigarettes   Start date: 86   Quit date: 77   Years since quitting: 36.9  Smokeless Tobacco Never  Tobacco Comments   smoking cessation materials not required    Goals Met:  Independence with exercise equipment Exercise tolerated well No report of concerns or symptoms today  Goals Unmet:  Not Applicable  Comments: Pt able to follow exercise prescription today without complaint.  Will continue to monitor for progression.    Dr. Bethann Punches is Medical Director for [facility] Cardiac Rehabilitation.  Dr. Vida Rigger is Medical Director for [facility] Pulmonary Rehabilitation.

## 2023-11-16 ENCOUNTER — Encounter: Payer: Self-pay | Admitting: Family Medicine

## 2023-11-16 DIAGNOSIS — C50911 Malignant neoplasm of unspecified site of right female breast: Secondary | ICD-10-CM | POA: Diagnosis not present

## 2023-11-16 NOTE — Telephone Encounter (Signed)
Care team updated and letter sent for eye exam notes.

## 2023-11-24 DIAGNOSIS — C50911 Malignant neoplasm of unspecified site of right female breast: Secondary | ICD-10-CM | POA: Diagnosis not present

## 2023-11-29 ENCOUNTER — Encounter: Payer: Medicare Other | Attending: Internal Medicine

## 2023-11-29 DIAGNOSIS — Z171 Estrogen receptor negative status [ER-]: Secondary | ICD-10-CM | POA: Insufficient documentation

## 2023-11-29 DIAGNOSIS — C50411 Malignant neoplasm of upper-outer quadrant of right female breast: Secondary | ICD-10-CM | POA: Insufficient documentation

## 2023-12-08 DIAGNOSIS — H5213 Myopia, bilateral: Secondary | ICD-10-CM | POA: Diagnosis not present

## 2023-12-08 DIAGNOSIS — H2513 Age-related nuclear cataract, bilateral: Secondary | ICD-10-CM | POA: Diagnosis not present

## 2023-12-08 DIAGNOSIS — E119 Type 2 diabetes mellitus without complications: Secondary | ICD-10-CM | POA: Diagnosis not present

## 2023-12-08 DIAGNOSIS — H1045 Other chronic allergic conjunctivitis: Secondary | ICD-10-CM | POA: Diagnosis not present

## 2023-12-15 DIAGNOSIS — Z171 Estrogen receptor negative status [ER-]: Secondary | ICD-10-CM

## 2023-12-15 NOTE — Progress Notes (Signed)
Daily Session Note  Patient Details  Name: Morgan Peterson MRN: [id number] Date of Birth: 09-24-1951 Referring Provider:   Doristine Devoid Cancer Associated Rehabilitation & Exercise from 11/03/2023 in [facility] Cardiac and Pulmonary Rehab  Referring Provider Louretta Shorten, MD       Encounter Date: 12/15/2023  Check In:  Session Check In - 12/15/23 1321       Check-In   Supervising physician immediately available to respond to emergencies See telemetry face sheet for immediately available ER MD    Location [facility]-Cardiac & Pulmonary Rehab    Staff Present Maxon Conetta BS, , Exercise Physiologist    Virtual Visit No    Medication changes reported     No    Fall or balance concerns reported    No    Warm-up and Cool-down Performed on first and last piece of equipment    Resistance Training Performed Yes    VAD Patient? No    PAD/SET Patient? No      Pain Assessment   Currently in Pain? No/denies    Multiple Pain Sites No                Social History   Tobacco Use  Smoking Status Former   Current packs/day: 0.00   Average packs/day: 0.3 packs/day for 10.0 years (2.5 ttl pk-yrs)   Types: Cigarettes   Start date: 17   Quit date: 90   Years since quitting: 37.0  Smokeless Tobacco Never  Tobacco Comments   smoking cessation materials not required    Goals Met:  Independence with exercise equipment Exercise tolerated well No report of concerns or symptoms today  Goals Unmet:  Not Applicable  Comments: Pt able to follow exercise prescription today without complaint.  Will continue to monitor for progression.    Dr. Bethann Punches is Medical Director for [facility] Cardiac Rehabilitation.  Dr. Vida Rigger is Medical Director for [facility] Pulmonary Rehabilitation.

## 2023-12-20 DIAGNOSIS — Z171 Estrogen receptor negative status [ER-]: Secondary | ICD-10-CM

## 2023-12-20 NOTE — Progress Notes (Signed)
 Daily Session Note  Patient Details  Name: Morgan Peterson MRN: [id number] Date of Birth: 09/14/1951 Referring Provider:   Conrad Ports Cancer Associated Rehabilitation & Exercise from 11/03/2023 in [facility] Cardiac and Pulmonary Rehab  Referring Provider Rennie Lighter, MD       Encounter Date: 12/20/2023  Check In:  Session Check In - 12/20/23 1227       Check-In   Supervising physician immediately available to respond to emergencies See telemetry face sheet for immediately available ER MD    Location [facility]-Cardiac & Pulmonary Rehab    Staff Present Devaughn Jaeger, BS, Exercise Physiologist;Margaret Best, MS, Exercise Physiologist    Virtual Visit No    Medication changes reported     No    Fall or balance concerns reported    No    Warm-up and Cool-down Performed on first and last piece of equipment    Resistance Training Performed Yes    VAD Patient? No    PAD/SET Patient? No      Pain Assessment   Currently in Pain? No/denies    Multiple Pain Sites No                Social History   Tobacco Use  Smoking Status Former   Current packs/day: 0.00   Average packs/day: 0.3 packs/day for 10.0 years (2.5 ttl pk-yrs)   Types: Cigarettes   Start date: 55   Quit date: 56   Years since quitting: 37.0  Smokeless Tobacco Never  Tobacco Comments   smoking cessation materials not required    Goals Met:  Independence with exercise equipment Exercise tolerated well No report of concerns or symptoms today  Goals Unmet:  Not Applicable  Comments: Pt able to follow exercise prescription today without complaint.  Will continue to monitor for progression.    Dr. Oneil Pinal is Medical Director for [facility] Cardiac Rehabilitation.  Dr. Fuad Aleskerov is Medical Director for [facility] Pulmonary Rehabilitation.

## 2023-12-26 ENCOUNTER — Encounter: Payer: Self-pay | Admitting: Internal Medicine

## 2023-12-27 ENCOUNTER — Encounter: Payer: Self-pay | Attending: Internal Medicine | Admitting: *Deleted

## 2023-12-27 DIAGNOSIS — C50411 Malignant neoplasm of upper-outer quadrant of right female breast: Secondary | ICD-10-CM | POA: Insufficient documentation

## 2023-12-27 DIAGNOSIS — Z171 Estrogen receptor negative status [ER-]: Secondary | ICD-10-CM | POA: Insufficient documentation

## 2023-12-27 NOTE — Progress Notes (Signed)
 Daily Session Note  Patient Details  Name: Morgan Peterson MRN: [id number] Date of Birth: 12-May-1951 Referring Provider:   Conrad Ports Cancer Associated Rehabilitation & Exercise from 11/03/2023 in [facility] Cardiac and Pulmonary Rehab  Referring Provider Rennie Lighter, MD       Encounter Date: 12/27/2023  Check In:  Session Check In - 12/27/23 1218       Check-In   Supervising physician immediately available to respond to emergencies See telemetry face sheet for immediately available ER MD    Location [facility]-Cardiac & Pulmonary Rehab    Staff Present Hoy Rodney, RN BSN;Margaret Best, MS, Exercise Physiologist;Noah Tickle, BS, Exercise Physiologist    Virtual Visit No    Medication changes reported     No    Warm-up and Cool-down Performed on first and last piece of equipment    Resistance Training Performed Yes    VAD Patient? No    PAD/SET Patient? No      Pain Assessment   Currently in Pain? No/denies                Social History   Tobacco Use  Smoking Status Former   Current packs/day: 0.00   Average packs/day: 0.3 packs/day for 10.0 years (2.5 ttl pk-yrs)   Types: Cigarettes   Start date: 7   Quit date: 1988   Years since quitting: 37.0  Smokeless Tobacco Never  Tobacco Comments   smoking cessation materials not required    Goals Met:  Independence with exercise equipment Exercise tolerated well No report of concerns or symptoms today Strength training completed today  Goals Unmet:  Not Applicable  Comments: Pt able to follow exercise prescription today without complaint.  Will continue to monitor for progression.    Dr. Oneil Pinal is Medical Director for [facility] Cardiac Rehabilitation.  Dr. Fuad Aleskerov is Medical Director for [facility] Pulmonary Rehabilitation.

## 2024-01-02 DIAGNOSIS — L648 Other androgenic alopecia: Secondary | ICD-10-CM | POA: Diagnosis not present

## 2024-01-12 DIAGNOSIS — E119 Type 2 diabetes mellitus without complications: Secondary | ICD-10-CM | POA: Diagnosis not present

## 2024-01-12 DIAGNOSIS — H2513 Age-related nuclear cataract, bilateral: Secondary | ICD-10-CM | POA: Diagnosis not present

## 2024-01-12 DIAGNOSIS — H1045 Other chronic allergic conjunctivitis: Secondary | ICD-10-CM | POA: Diagnosis not present

## 2024-01-27 ENCOUNTER — Ambulatory Visit: Payer: PPO | Admitting: Family Medicine

## 2024-01-27 ENCOUNTER — Encounter: Payer: Self-pay | Admitting: Internal Medicine

## 2024-01-27 ENCOUNTER — Encounter: Payer: Self-pay | Admitting: Family Medicine

## 2024-01-27 VITALS — BP 108/72 | HR 86 | Temp 98.3°F | Resp 16 | Ht 66.0 in | Wt 165.6 lb

## 2024-01-27 DIAGNOSIS — K219 Gastro-esophageal reflux disease without esophagitis: Secondary | ICD-10-CM

## 2024-01-27 DIAGNOSIS — I10 Essential (primary) hypertension: Secondary | ICD-10-CM | POA: Diagnosis not present

## 2024-01-27 DIAGNOSIS — J029 Acute pharyngitis, unspecified: Secondary | ICD-10-CM

## 2024-01-27 MED ORDER — SPIRONOLACTONE 25 MG PO TABS: 25.0000 mg | ORAL_TABLET | Freq: Every day | ORAL | 0 refills | Status: DC

## 2024-01-27 MED ORDER — OMEPRAZOLE 40 MG PO CPDR: 40.0000 mg | DELAYED_RELEASE_CAPSULE | Freq: Every day | ORAL | 0 refills | Status: DC

## 2024-01-27 NOTE — Patient Instructions (Signed)
OOFOS sandals  

## 2024-01-27 NOTE — Progress Notes (Signed)
 Name: Morgan Peterson   MRN: [id number]    DOB: 05-Jan-1951   Date:01/27/2024       Progress Note  Subjective  Chief Complaint  Chief Complaint  Patient presents with   throat problem    Ongoing for 4 months, pt noticed one side hurts more than the other mainly while eating and has to hold on to her jaw to avoid the pain   Discussed the use of AI scribe software for clinical note transcription with the patient, who gave verbal consent to proceed.  History of Present Illness   Morgan Peterson is a 73 year old female with a history of thyroid  nodule and breast cancer who presents with difficulty swallowing and sore throat.  She has been experiencing difficulty swallowing and a sore throat, primarily on the right side, for several months. There is a sensation of 'sticking together in the back' of her throat, with soreness worsening as the day progresses. A burning sensation occurs when chewing, akin to eating something sour. Occasionally, there is a feeling of swelling on both sides of her neck, though no distinct lumps are felt.  Her medical history includes a thyroid  nodule and breast cancer, for which she received Keytruda . Her thyroid  was evaluated in October, with normal results. She also has a history of reflux and has been taking Pepcid , increasing the dose to 40 mg twice daily to manage epigastric pain. No significant weight loss is noted, with weight maintenance attributed to Ozempic use.  She experiences dry mouth and keeps a drink on her desk to manage this symptom. No fever, chills, or heartburn currently, although she has a history of reflux. She uses montelukast , Xyzal , and a nasal spray for allergies.  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, she has been using [employer]ok[employer]  shoes but significant pain when she removes shoes at the end of the day        Patient Active Problem List   Diagnosis Date Noted   Other diseases of stomach and duodenum 10/22/2022   Chemotherapy-induced neuropathy (HCC) 08/27/2022   Bilateral lower extremity edema 08/27/2022   Hives 08/27/2022   Food allergy  08/05/2022   Atherosclerosis of both carotid arteries 08/05/2022   Migraine aura without headache 08/05/2022   Carcinoma of upper-outer quadrant of right breast in female, estrogen receptor negative (HCC) 05/26/2022   Breast cancer (HCC) 05/10/2022   Goals of care, counseling/discussion 05/10/2022   Encounter for monitoring cardiotoxic drug therapy 05/10/2022   Carotid stenosis 04/21/2022   Hypertension associated with type 2 diabetes mellitus (HCC) 01/20/2021   Intermittent low back pain 08/18/2017   Irritable bowel syndrome with constipation 05/02/2017   Vitamin D  deficiency 09/09/2016   Hiatal hernia 06/02/2016   Allergic rhinitis, seasonal 06/02/2016   Obesity (BMI 30-39.9) 04/30/2016   Angio-edema 04/30/2016   Hyperlipidemia due to type 2 diabetes mellitus (HCC) 11/04/2015   Cervical radiculopathy at C6 07/31/2015   Neuroma digital nerve 07/31/2015   Dyslipidemia associated with type 2 diabetes mellitus (HCC) 06/30/2015   Cervical disc disease 06/30/2015   Essential hypertension 06/30/2015   History of breast cancer in female 03/23/2009    Past Surgical History:  Procedure Laterality Date   ABDOMINAL EXPLORATION SURGERY  2000   ovarian cyst   BREAST BIOPSY Right 2010   +  BREAST BIOPSY Left 04/06/2023   Left Breast Stereo Bx X Clip - path pending   BREAST BIOPSY Left 04/06/2023   Left Breast Stereo Bx, Coil Clip - path pending   BREAST BIOPSY Left 04/06/2023   MM LT BREAST BX W LOC DEV 1ST LESION IMAGE BX SPEC STEREO GUIDE 04/06/2023 [facility]   BREAST BIOPSY Left 04/06/2023   MM LT BREAST BX W LOC DEV EA AD LESION IMG BX SPEC STEREO GUIDE 04/06/2023  [facility]   BREAST EXCISIONAL BIOPSY Right 2010   + mammo site inasive mammo ca   BREAST LUMPECTOMY Right 2010   [facility]   COLONOSCOPY  07/19/2012   Normal exam, Dr. Dessa   COLONOSCOPY WITH PROPOFOL  N/A 04/21/2022   Procedure: COLONOSCOPY WITH PROPOFOL ;  Surgeon: Dessa Reyes ORN, MD;  Location: [facility];  Service: Endoscopy;  Laterality: N/A;   ESOPHAGOGASTRODUODENOSCOPY (EGD) WITH PROPOFOL  N/A 10/22/2022   Procedure: ESOPHAGOGASTRODUODENOSCOPY (EGD) WITH PROPOFOL ;  Surgeon: Jinny Carmine, MD;  Location: [facility];  Service: Endoscopy;  Laterality: N/A;  Diabetic   MASTECTOMY Right 2010   partial/ lumpectomy   PORTACATH PLACEMENT Left 05/19/2022   Procedure: INSERTION PORT-A-CATH;  Surgeon: Dessa Reyes ORN, MD;  Location: [facility] ORS;  Service: General;  Laterality: Left;   SIMPLE MASTECTOMY WITH AXILLARY SENTINEL NODE BIOPSY Right 04/26/2022   Procedure: SIMPLE MASTECTOMY WITH AXILLARY SENTINEL NODE BIOPSY;  Surgeon: Dessa Reyes ORN, MD;  Location: [facility] OR[facility];  Service: General;  Laterality: Right;  RNFA to assist   TUBAL LIGATION  20 years ago    Family History  Problem Relation Age of Onset   Osteoporosis Mother    Diabetes Father    Kidney disease Father    Diabetes Brother    Breast cancer Maternal Aunt    Bladder Cancer Maternal Aunt    Cervical cancer Maternal Aunt    Colon cancer Maternal Uncle    Lung cancer Maternal Uncle     Social History   Tobacco Use   Smoking status: Former    Current packs/day: 0.00    Average packs/day: 0.3 packs/day for 10.0 years (2.5 ttl pk-yrs)    Types: Cigarettes    Start date: 84    Quit date: 1988    Years since quitting: 37.1   Smokeless tobacco: Never   Tobacco comments:    smoking cessation materials not required  Substance Use Topics   Alcohol use: Yes    Comment: wine occ     Current Outpatient Medications:    aspirin  EC 81 MG tablet, Take 1 tablet (81 mg total) by mouth daily., Disp: 30  tablet, Rfl: 0   azelastine  (ASTELIN ) 0.1 % nasal spray, Place 1 spray into both nostrils 2 (two) times daily. Use in each nostril as directed, Disp: 90 mL, Rfl: 1   betamethasone dipropionate 0.05 % lotion, Apply 1 Application topically daily., Disp: , Rfl:    betamethasone, augmented, (DIPROLENE) 0.05 % lotion, Apply 1 application  topically 2 (two) times daily., Disp: , Rfl:    Blood Glucose Monitoring Suppl (ONETOUCH VERIO) w/Device KIT, 1 Device by Does not apply route once., Disp: 1 kit, Rfl: 0   cholecalciferol (VITAMIN D ) 1000 UNITS tablet, Take 1,000 Units by mouth daily., Disp: , Rfl:    Cyanocobalamin (VITAMIN B-12 PO), Take 1 tablet by mouth daily as needed (fatigue)., Disp: , Rfl:    EPINEPHrine  (EPIPEN  2-PAK) 0.3 mg/0.3 mL IJ SOAJ injection, Inject 0.3 mg into the muscle as needed for anaphylaxis., Disp: 2 each,  Rfl: 0   famotidine  (PEPCID ) 20 MG tablet, Take 1 tablet (20 mg total) by mouth 2 (two) times daily., Disp: 180 tablet, Rfl: 1   glucose blood (ONETOUCH VERIO) test strip, Use as instructed, Disp: 100 each, Rfl: 12   ketoconazole (NIZORAL) 2 % shampoo, Apply 1 Application topically 3 (three) times a week., Disp: , Rfl:    Lancets (ONETOUCH ULTRASOFT) lancets, Use as instructed, Disp: 100 each, Rfl: 3   levocetirizine (XYZAL ) 5 MG tablet, Take 1 tablet (5 mg total) by mouth daily., Disp: 90 tablet, Rfl: 1   lubiprostone  (AMITIZA ) 8 MCG capsule, TAKE 1 CAPSULE (8 MCG TOTAL) BY MOUTH 2 (TWO) TIMES DAILY WITH A MEAL., Disp: 180 capsule, Rfl: 1   minoxidil  (LONITEN ) 2.5 MG tablet, Take 2.5 mg by mouth daily., Disp: , Rfl:    montelukast  (SINGULAIR ) 10 MG tablet, TAKE 1 TABLET BY MOUTH EVERYDAY AT BEDTIME, Disp: 90 tablet, Rfl: 1   Multiple Vitamin (MULTIVITAMIN WITH MINERALS) TABS tablet, Take 1 tablet by mouth 2 (two) times a week., Disp: , Rfl:    omeprazole  (PRILOSEC) 40 MG capsule, Take 1 capsule (40 mg total) by mouth daily., Disp: 90 capsule, Rfl: 0   OZEMPIC, 0.25 OR 0.5  MG/DOSE, 2 MG/1.5ML SOPN, Inject 0.5 mg into the skin every Sunday., Disp: , Rfl:    prednisoLONE acetate (PRED FORTE) 1 % ophthalmic suspension, Place 1 drop into the left eye., Disp: , Rfl:    pregabalin  (LYRICA ) 50 MG capsule, TAKE 1 CAPSULE BY MOUTH 3 TIMES DAILY., Disp: 90 capsule, Rfl: 0   rosuvastatin  (CRESTOR ) 20 MG tablet, Take 1 tablet (20 mg total) by mouth every morning., Disp: 90 tablet, Rfl: 1   spironolactone  (ALDACTONE ) 25 MG tablet, Take 1 tablet (25 mg total) by mouth daily., Disp: 90 tablet, Rfl: 0   Vitamin D , Ergocalciferol , (DRISDOL ) 1.25 MG (50000 UNIT) CAPS capsule, Take 50,000 Units by mouth once a week., Disp: , Rfl:  No current facility-administered medications for this visit.  Facility-Administered Medications Ordered in Other Visits:    heparin  lock flush 100 UNIT/ML injection, , , ,    heparin  lock flush 100 unit/mL, 500 Units, Intravenous, Once, Brahmanday, Govinda R, MD  Allergies  Allergen Reactions   Levaquin [Levofloxacin In D5w] Swelling    Other reaction(s): Arthralgia (Joint Pain)   Losartan     Other reaction(s): Angioedema   Metoprolol Swelling    Lip and tongue tingling and numbness   Saxagliptin Other (See Comments)    Increased PVC's  Other reaction(s): irregular heart rate   Amlodipine-Olmesartan    Canagliflozin Other (See Comments)    Bladder pain   Egg White [Egg White (Egg Protein)]     Unknown reaction   Gabapentin      cns side effects.   Glipizide     Other reaction(s): Abdominal pain   Gluten Meal    Lisinopril Swelling    Angioedema   Metformin  And Related Nausea Only   Milk (Cow)    Milk-Related Compounds    Shrimp Extract     Other reaction(s): Other (See Comments)   Tramadol Nausea And Vomiting   Zyprexa  [Olanzapine ]    Actos [Pioglitazone] Other (See Comments) and Nausea And Vomiting    Bladder pain   Atorvastatin  Other (See Comments)    Other reaction(s): Abdominal Pain, Other (See Comments)   Carvedilol  Other  (See Comments)    NUMBNESS, TINGLING, ACHING IN ARMS   Prednisone  Palpitations    I personally reviewed active  problem list, medication list, allergies with the patient/caregiver today.   ROS  Ten systems reviewed and is negative except as mentioned in HPI    Objective  Vitals:   01/27/24 1026  BP: 108/72  Pulse: 86  Resp: 16  Temp: 98.3 F (36.8 C)  TempSrc: Oral  SpO2: 98%  Weight: 165 lb 9.6 oz (75.1 kg)  Height: 5' 6 (1.676 m)    Body mass index is 26.73 kg/m.  Physical Exam  Constitutional: Patient appears well-developed and well-nourished.  No distress.  HEENT: head atraumatic, normocephalic, pupils equal and reactive to light, ears normal TM, no pain with jaw movement or palpation of TMJ, neck supple,  no thyromegaly, mild erythema of posterior pharynx Cardiovascular: Normal rate, regular rhythm and normal heart sounds.  No murmur heard. No BLE edema. Pulmonary/Chest: Effort normal and breath sounds normal. No respiratory distress. Abdominal: Soft.  There is no tenderness. Psychiatric: Patient has a normal mood and affect. behavior is normal. Judgment and thought content normal.   Recent Results (from the past 2160 hours)  Glucose, capillary     Status: Abnormal   Collection Time: 11/10/23 12:22 PM  Result Value Ref Range   Glucose-Capillary 120 (H) 70 - 99 mg/dL    Comment: Glucose reference range applies only to samples taken after fasting for at least 8 hours.  Glucose, capillary     Status: None   Collection Time: 11/10/23  1:22 PM  Result Value Ref Range   Glucose-Capillary 97 70 - 99 mg/dL    Comment: Glucose reference range applies only to samples taken after fasting for at least 8 hours.  Glucose, capillary     Status: Abnormal   Collection Time: 11/15/23 12:24 PM  Result Value Ref Range   Glucose-Capillary 118 (H) 70 - 99 mg/dL    Comment: Glucose reference range applies only to samples taken after fasting for at least 8 hours.    Diabetic  Foot Exam:     PHQ2/9:    01/27/2024   10:23 AM 10/11/2023    9:45 AM 04/12/2023    9:55 AM 04/07/2023   10:45 AM 08/27/2022    3:35 PM  Depression screen PHQ 2/9  Decreased Interest 0 0 0 0 0  Down, Depressed, Hopeless 0 0 0 0 0  PHQ - 2 Score 0 0 0 0 0  Altered sleeping 0 0 0  0  Tired, decreased energy 0 0 0  1  Change in appetite 0 0 0  0  Feeling bad or failure about yourself  0 0 0  0  Trouble concentrating 0 0 0  0  Moving slowly or fidgety/restless 0 0 0  0  Suicidal thoughts 0 0 0  0  PHQ-9 Score 0 0 0  1  Difficult doing work/chores Not difficult at all        phq 9 is negative  Fall Risk:    01/27/2024   10:22 AM 10/11/2023    9:45 AM 04/12/2023    9:55 AM 04/07/2023   10:40 AM 08/27/2022    3:35 PM  Fall Risk   Falls in the past year? 0 0 0 0 0  Number falls in past yr: 0 0 0 0   Injury with Fall? 0 0 0 0   Risk for fall due to : No Fall Risks No Fall Risks No Fall Risks No Fall Risks No Fall Risks  Follow up Falls prevention discussed;Education provided;Falls evaluation completed Falls prevention discussed Falls  prevention discussed Education provided;Falls prevention discussed Falls prevention discussed;Education provided;Falls evaluation completed     Assessment and Plan    Dysphagia and Sore Throat New onset of discomfort and sensation of swelling in the submandibular region, particularly with swallowing and chewing. No fever or chills. History of thyroid  nodule and GERD. No palpable lymphadenopathy or nodules on examination. -Start Omeprazole  40mg  before the first meal of the day for suspected GERD exacerbation. -Continue Famotidine  at night. -Avoid spicy food, caffeine, and eating before bed. -Raise head of bed during sleep. -If no improvement in 2-3 weeks, consider referral to ENT for further evaluation.  Hypertension Blood pressure well controlled, possibly over controlled with current medication regimen. -Reduce Spironolactone  to 25mg  daily due to low  blood pressure readings.  Peripheral Neuropathy Ongoing discomfort due to chemotherapy-induced peripheral neuropathy. -Continue Gabapentin  as prescribed. -Recommended OOFOS footwear for home use to alleviate discomfort when barefoot.  Follow-up in May 2025 or sooner if dysphagia and sore throat do not improve with GERD treatment.

## 2024-02-20 ENCOUNTER — Other Ambulatory Visit: Payer: Self-pay | Admitting: Surgery

## 2024-02-20 DIAGNOSIS — Z1231 Encounter for screening mammogram for malignant neoplasm of breast: Secondary | ICD-10-CM

## 2024-02-23 DIAGNOSIS — Z794 Long term (current) use of insulin: Secondary | ICD-10-CM | POA: Diagnosis not present

## 2024-02-23 DIAGNOSIS — E1169 Type 2 diabetes mellitus with other specified complication: Secondary | ICD-10-CM | POA: Diagnosis not present

## 2024-02-23 DIAGNOSIS — E785 Hyperlipidemia, unspecified: Secondary | ICD-10-CM | POA: Diagnosis not present

## 2024-02-23 DIAGNOSIS — E1159 Type 2 diabetes mellitus with other circulatory complications: Secondary | ICD-10-CM | POA: Diagnosis not present

## 2024-02-23 DIAGNOSIS — I152 Hypertension secondary to endocrine disorders: Secondary | ICD-10-CM | POA: Diagnosis not present

## 2024-02-23 DIAGNOSIS — E119 Type 2 diabetes mellitus without complications: Secondary | ICD-10-CM | POA: Diagnosis not present

## 2024-02-23 LAB — HEMOGLOBIN A1C: Hemoglobin A1C: 6.6

## 2024-03-13 ENCOUNTER — Encounter: Payer: Self-pay | Admitting: Family Medicine

## 2024-03-13 ENCOUNTER — Ambulatory Visit: Admitting: Family Medicine

## 2024-03-13 VITALS — BP 122/72 | HR 105 | Resp 16 | Ht 66.0 in | Wt 166.6 lb

## 2024-03-13 DIAGNOSIS — G43109 Migraine with aura, not intractable, without status migrainosus: Secondary | ICD-10-CM | POA: Diagnosis not present

## 2024-03-13 DIAGNOSIS — J302 Other seasonal allergic rhinitis: Secondary | ICD-10-CM

## 2024-03-13 DIAGNOSIS — Z853 Personal history of malignant neoplasm of breast: Secondary | ICD-10-CM

## 2024-03-13 DIAGNOSIS — J3089 Other allergic rhinitis: Secondary | ICD-10-CM | POA: Diagnosis not present

## 2024-03-13 DIAGNOSIS — I10 Essential (primary) hypertension: Secondary | ICD-10-CM

## 2024-03-13 DIAGNOSIS — L509 Urticaria, unspecified: Secondary | ICD-10-CM | POA: Diagnosis not present

## 2024-03-13 DIAGNOSIS — E559 Vitamin D deficiency, unspecified: Secondary | ICD-10-CM | POA: Diagnosis not present

## 2024-03-13 DIAGNOSIS — K581 Irritable bowel syndrome with constipation: Secondary | ICD-10-CM

## 2024-03-13 DIAGNOSIS — M545 Low back pain, unspecified: Secondary | ICD-10-CM

## 2024-03-13 DIAGNOSIS — Z7985 Long-term (current) use of injectable non-insulin antidiabetic drugs: Secondary | ICD-10-CM | POA: Diagnosis not present

## 2024-03-13 DIAGNOSIS — I498 Other specified cardiac arrhythmias: Secondary | ICD-10-CM

## 2024-03-13 DIAGNOSIS — K219 Gastro-esophageal reflux disease without esophagitis: Secondary | ICD-10-CM | POA: Diagnosis not present

## 2024-03-13 DIAGNOSIS — E1169 Type 2 diabetes mellitus with other specified complication: Secondary | ICD-10-CM

## 2024-03-13 DIAGNOSIS — G620 Drug-induced polyneuropathy: Secondary | ICD-10-CM

## 2024-03-13 DIAGNOSIS — I499 Cardiac arrhythmia, unspecified: Secondary | ICD-10-CM | POA: Insufficient documentation

## 2024-03-13 MED ORDER — OMEPRAZOLE 40 MG PO CPDR: 40.0000 mg | DELAYED_RELEASE_CAPSULE | Freq: Every day | ORAL | 1 refills | Status: DC

## 2024-03-13 MED ORDER — SPIRONOLACTONE 25 MG PO TABS: 25.0000 mg | ORAL_TABLET | Freq: Every day | ORAL | 1 refills | Status: DC

## 2024-03-13 MED ORDER — ROSUVASTATIN CALCIUM 20 MG PO TABS: 20.0000 mg | ORAL_TABLET | ORAL | 1 refills | Status: DC

## 2024-03-13 MED ORDER — EPINEPHRINE 0.3 MG/0.3ML IJ SOAJ: 0.3000 mg | INTRAMUSCULAR | 0 refills | Status: AC | PRN

## 2024-03-13 MED ORDER — FAMOTIDINE 20 MG PO TABS: 20.0000 mg | ORAL_TABLET | Freq: Every day | ORAL | 1 refills | Status: DC

## 2024-03-13 NOTE — Progress Notes (Signed)
 Name: Morgan Peterson   MRN: [id number]    DOB: [birth year]   Date:03/13/2024       Progress Note  Subjective  Chief Complaint  Chief Complaint  Patient presents with   Medical Management of Chronic Issues   HPI   HTN: she is off Atenolol due to bradycardia, on her [employer] her heart rate goes from 40-148 . She denies palpitation or SOB. 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  Accupunture helped a little but not enough to justify getting it done. She states continues to have numbness on toes and ankles, also has burning on her feet at night    Hives: secondary to food allergy , she still eats eggs and peanut butter but states no hives with that. She saw allergist and states further test was negative. 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 She had mastectomy done by Dr. Birdie Sons and is now getting chemotherapy / Oncologist is Dr. Donneta Romberg, finished chemotherapy Dec 2023 , currently taking Ketruda total of 9 cycles should be done in July  2024 , up to date with appointments with Dr. Leonard Schwartz and also has bone density and mammogram scheduled next month    DM type II with dyslipidemia?HTN : she is seeing  Endocrinologist Dr. Gershon Crane , last A1C was done 02/2024 and it was 6.6 %  She has associated dyslipidemia and obesity. Denies polyphagia, polydipsia or polyuria . Continue Crestor for dyslipidemia, allergic to ACE/ARB. Taking Ozempic and is doing well.    Migraine headaches: she was seen by Dr. Sherryll Burger, had labs done in May 22, and MRI was unremarkable. 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 Turner Daniels, last TSH normal , continue regular follow ups.  She denies change in  bowel movements . She will have repeat TSH in April    Carotid atherosclerosis: found on C-spine x-ray, she has a pulsating mass on right side of neck . She had doppler done     IMPRESSION: 12/2021 carotid doppler  No evidence of hemodynamically significant stenosis involving either the right or left carotid circulation in the neck by Doppler criteria. Minimal plaque in the right carotid bulb results in less than 50% stenosis. No significant plaque appreciated in the left carotid circulation.    Intermittent low back pain: dull aching, sometimes burning, no radiculitis. Stable    Patient Active Problem List   Diagnosis Date Noted   Other diseases of stomach and duodenum 10/22/2022   Chemotherapy-induced neuropathy (HCC) 08/27/2022   Bilateral lower extremity edema 08/27/2022   Hives 08/27/2022   Food allergy 08/05/2022   Atherosclerosis of both carotid arteries 08/05/2022   Migraine aura without headache 08/05/2022   Carcinoma of upper-outer quadrant of right breast in female, estrogen receptor negative (HCC) 05/26/2022   Breast cancer (HCC) 05/10/2022  Goals of care, counseling/discussion 05/10/2022   Encounter for monitoring cardiotoxic drug therapy 05/10/2022   Carotid stenosis 04/21/2022   Hypertension associated with type 2 diabetes mellitus (HCC) 01/20/2021   Intermittent low back pain 08/18/2017   Irritable bowel syndrome with constipation 05/02/2017   Vitamin D deficiency 09/09/2016   Hiatal hernia 06/02/2016   Allergic rhinitis, seasonal 06/02/2016   Obesity (BMI 30-39.9) 04/30/2016   Angio-edema 04/30/2016   Hyperlipidemia  due to type 2 diabetes mellitus (HCC) 11/04/2015   Cervical radiculopathy at C6 07/31/2015   Neuroma digital nerve 07/31/2015   Dyslipidemia associated with type 2 diabetes mellitus (HCC) 06/30/2015   Cervical disc disease 06/30/2015   Essential hypertension 06/30/2015   History of breast cancer in female 03/23/2009    Past Surgical History:  Procedure Laterality Date   ABDOMINAL EXPLORATION SURGERY  2000   ovarian cyst   BREAST BIOPSY Right 2010   +    BREAST BIOPSY Left 04/06/2023   Left Breast Stereo Bx X Clip - path pending   BREAST BIOPSY Left 04/06/2023   Left Breast Stereo Bx, Coil Clip - path pending   BREAST BIOPSY Left 04/06/2023   MM LT BREAST BX W LOC DEV 1ST LESION IMAGE BX SPEC STEREO GUIDE 04/06/2023 [facility]   BREAST BIOPSY Left 04/06/2023   MM LT BREAST BX W LOC DEV EA AD LESION IMG BX SPEC STEREO GUIDE 04/06/2023 [facility]   BREAST EXCISIONAL BIOPSY Right 2010   + mammo site inasive mammo ca   BREAST LUMPECTOMY Right 2010   [facility]   COLONOSCOPY  07/19/2012   Normal exam, Dr. Lemar Livings   COLONOSCOPY WITH PROPOFOL N/A 04/21/2022   Procedure: COLONOSCOPY WITH PROPOFOL;  Surgeon: Earline Mayotte, MD;  Location: [facility];  Service: Endoscopy;  Laterality: N/A;   ESOPHAGOGASTRODUODENOSCOPY (EGD) WITH PROPOFOL N/A 10/22/2022   Procedure: ESOPHAGOGASTRODUODENOSCOPY (EGD) WITH PROPOFOL;  Surgeon: Midge Minium, MD;  Location: [facility];  Service: Endoscopy;  Laterality: N/A;  Diabetic   MASTECTOMY Right 2010   partial/ lumpectomy   PORTACATH PLACEMENT Left 05/19/2022   Procedure: INSERTION PORT-A-CATH;  Surgeon: Earline Mayotte, MD;  Location: [facility];  Service: General;  Laterality: Left;   SIMPLE MASTECTOMY WITH AXILLARY SENTINEL NODE BIOPSY Right 04/26/2022   Procedure: SIMPLE MASTECTOMY WITH AXILLARY SENTINEL NODE BIOPSY;  Surgeon: Earline Mayotte, MD;  Location: [facility];  Service: General;  Laterality: Right;  RNFA to assist   TUBAL  LIGATION  20 years ago    Family History  Problem Relation Age of Onset   Osteoporosis Mother    Diabetes Father    Kidney disease Father    Diabetes Brother    Breast cancer Maternal Aunt    Bladder Cancer Maternal Aunt    Cervical cancer Maternal Aunt    Colon cancer Maternal Uncle    Lung cancer Maternal Uncle     Social History   Tobacco Use   Smoking status: Former    Current packs/day: 0.00    Average packs/day: 0.3 packs/day for 10.0 years (2.5 ttl pk-yrs)    Types: Cigarettes    Start date: 19    Quit date: 1988    Years since quitting: 37.2   Smokeless tobacco: Never   Tobacco comments:    smoking cessation materials not required  Substance Use Topics   Alcohol use: Yes    Comment: wine occ     Current Outpatient Medications:    aspirin EC 81 MG tablet, Take 1 tablet (81  mg total) by mouth daily., Disp: 30 tablet, Rfl: 0   azelastine (ASTELIN) 0.1 % nasal spray, Place 1 spray into both nostrils 2 (two) times daily. Use in each nostril as directed, Disp: 90 mL, Rfl: 1   betamethasone dipropionate 0.05 % lotion, Apply 1 Application topically daily., Disp: , Rfl:    betamethasone, augmented, (DIPROLENE) 0.05 % lotion, Apply 1 application  topically 2 (two) times daily., Disp: , Rfl:    Blood Glucose Monitoring Suppl (ONETOUCH VERIO) w/Device KIT, 1 Device by Does not apply route once., Disp: 1 kit, Rfl: 0   cholecalciferol (VITAMIN D) 1000 UNITS tablet, Take 1,000 Units by mouth daily., Disp: , Rfl:    Cyanocobalamin (VITAMIN B-12 PO), Take 1 tablet by mouth daily as needed (fatigue)., Disp: , Rfl:    EPINEPHrine (EPIPEN 2-PAK) 0.3 mg/0.3 mL IJ SOAJ injection, Inject 0.3 mg into the muscle as needed for anaphylaxis., Disp: 2 each, Rfl: 0   famotidine (PEPCID) 20 MG tablet, Take 1 tablet (20 mg total) by mouth 2 (two) times daily., Disp: 180 tablet, Rfl: 1   glucose blood (ONETOUCH VERIO) test strip, Use as instructed, Disp: 100 each, Rfl: 12   ketoconazole  (NIZORAL) 2 % shampoo, Apply 1 Application topically 3 (three) times a week., Disp: , Rfl:    Lancets (ONETOUCH ULTRASOFT) lancets, Use as instructed, Disp: 100 each, Rfl: 3   levocetirizine (XYZAL) 5 MG tablet, Take 1 tablet (5 mg total) by mouth daily., Disp: 90 tablet, Rfl: 1   lubiprostone (AMITIZA) 8 MCG capsule, TAKE 1 CAPSULE (8 MCG TOTAL) BY MOUTH 2 (TWO) TIMES DAILY WITH A MEAL., Disp: 180 capsule, Rfl: 1   minoxidil (LONITEN) 2.5 MG tablet, Take 2.5 mg by mouth daily., Disp: , Rfl:    montelukast (SINGULAIR) 10 MG tablet, TAKE 1 TABLET BY MOUTH EVERYDAY AT BEDTIME, Disp: 90 tablet, Rfl: 1   Multiple Vitamin (MULTIVITAMIN WITH MINERALS) TABS tablet, Take 1 tablet by mouth 2 (two) times a week., Disp: , Rfl:    omeprazole (PRILOSEC) 40 MG capsule, Take 1 capsule (40 mg total) by mouth daily., Disp: 90 capsule, Rfl: 0   OZEMPIC, 0.25 OR 0.5 MG/DOSE, 2 MG/1.5ML SOPN, Inject 0.5 mg into the skin every Sunday., Disp: , Rfl:    prednisoLONE acetate (PRED FORTE) 1 % ophthalmic suspension, Place 1 drop into the left eye., Disp: , Rfl:    pregabalin (LYRICA) 50 MG capsule, TAKE 1 CAPSULE BY MOUTH 3 TIMES DAILY., Disp: 90 capsule, Rfl: 0   rosuvastatin (CRESTOR) 20 MG tablet, Take 1 tablet (20 mg total) by mouth every morning., Disp: 90 tablet, Rfl: 1   spironolactone (ALDACTONE) 25 MG tablet, Take 1 tablet (25 mg total) by mouth daily., Disp: 90 tablet, Rfl: 0   Vitamin D, Ergocalciferol, (DRISDOL) 1.25 MG (50000 UNIT) CAPS capsule, Take 50,000 Units by mouth once a week., Disp: , Rfl:  No current facility-administered medications for this visit.  Facility-Administered Medications Ordered in Other Visits:    heparin lock flush 100 UNIT/ML injection, , , ,    heparin lock flush 100 unit/mL, 500 Units, Intravenous, Once, Brahmanday, Worthy Flank, MD  Allergies  Allergen Reactions   Levaquin [Levofloxacin In D5w] Swelling    Other reaction(s): Arthralgia (Joint Pain)   Losartan     Other  reaction(s): Angioedema   Metoprolol Swelling    Lip and tongue tingling and numbness   Saxagliptin Other (See Comments)    Increased PVC's  Other reaction(s): irregular heart rate  Amlodipine-Olmesartan    Canagliflozin Other (See Comments)    Bladder pain   Egg White [Egg White (Egg Protein)]     Unknown reaction   Gabapentin     cns side effects.   Glipizide     Other reaction(s): Abdominal pain   Gluten Meal    Lisinopril Swelling    Angioedema   Metformin And Related Nausea Only   Milk (Cow)    Milk-Related Compounds    Shrimp Extract     Other reaction(s): Other (See Comments)   Tramadol Nausea And Vomiting   Zyprexa [Olanzapine]    Actos [Pioglitazone] Other (See Comments) and Nausea And Vomiting    Bladder pain   Atorvastatin Other (See Comments)    Other reaction(s): Abdominal Pain, Other (See Comments)   Carvedilol Other (See Comments)    NUMBNESS, TINGLING, ACHING IN ARMS   Prednisone Palpitations    I personally reviewed active problem list, medication list, allergies with the patient/caregiver today.   ROS  Ten systems reviewed and is negative except as mentioned in HPI    Objective  Vitals:   03/13/24 1421  BP: 122/72  Pulse: (!) 105  Resp: 16  SpO2: 99%  Weight: 166 lb 9.6 oz (75.6 kg)  Height: 5\' 6"  (1.676 m)    Body mass index is 26.89 kg/m.  Physical Exam  Constitutional: Patient appears well-developed and well-nourished.  No distress.  HEENT: head atraumatic, normocephalic, pupils equal and reactive to light, neck supple Cardiovascular: Normal rate, regular rhythm and normal heart sounds.  No murmur heard. No BLE edema. Pulmonary/Chest: Effort normal and breath sounds normal. No respiratory distress. Abdominal: Soft.  There is no tenderness. Psychiatric: Patient has a normal mood and affect. behavior is normal. Judgment and thought content normal.   Recent Results (from the past 2160 hours)  Hemoglobin A1c     Status: None    Collection Time: 02/23/24 12:00 AM  Result Value Ref Range   Hemoglobin A1C 6.6     Diabetic Foot Exam:     PHQ2/9:    01/27/2024   10:23 AM 10/11/2023    9:45 AM 04/12/2023    9:55 AM 04/07/2023   10:45 AM 08/27/2022    3:35 PM  Depression screen PHQ 2/9  Decreased Interest 0 0 0 0 0  Down, Depressed, Hopeless 0 0 0 0 0  PHQ - 2 Score 0 0 0 0 0  Altered sleeping 0 0 0  0  Tired, decreased energy 0 0 0  1  Change in appetite 0 0 0  0  Feeling bad or failure about yourself  0 0 0  0  Trouble concentrating 0 0 0  0  Moving slowly or fidgety/restless 0 0 0  0  Suicidal thoughts 0 0 0  0  PHQ-9 Score 0 0 0  1  Difficult doing work/chores Not difficult at all        phq 9 is negative  Fall Risk:    01/27/2024   10:22 AM 10/11/2023    9:45 AM 04/12/2023    9:55 AM 04/07/2023   10:40 AM 08/27/2022    3:35 PM  Fall Risk   Falls in the past year? 0 0 0 0 0  Number falls in past yr: 0 0 0 0   Injury with Fall? 0 0 0 0   Risk for fall due to : No Fall Risks No Fall Risks No Fall Risks No Fall Risks No Fall Risks  Follow  up Falls prevention discussed;Education provided;Falls evaluation completed Falls prevention discussed Falls prevention discussed Education provided;Falls prevention discussed Falls prevention discussed;Education provided;Falls evaluation completed     Assessment & Plan  1. Dyslipidemia associated with type 2 diabetes mellitus (HCC) (Primary)  - Lipid panel - Microalbumin / creatinine urine ratio - rosuvastatin (CRESTOR) 20 MG tablet; Take 1 tablet (20 mg total) by mouth every morning.  Dispense: 90 tablet; Refill: 1  2. Chemotherapy-induced neuropathy (HCC)  stable  3. Essential hypertension  - spironolactone (ALDACTONE) 25 MG tablet; Take 1 tablet (25 mg total) by mouth daily.  Dispense: 90 tablet; Refill: 1  4. History of breast cancer in female  Up to date with hematologist   5. Gastroesophageal reflux disease without esophagitis  - famotidine  (PEPCID) 20 MG tablet; Take 1 tablet (20 mg total) by mouth at bedtime.  Dispense: 90 tablet; Refill: 1 - omeprazole (PRILOSEC) 40 MG capsule; Take 1 capsule (40 mg total) by mouth daily.  Dispense: 90 capsule; Refill: 1  6. Intermittent low back pain  stable  7. Migraine aura without headache  No problems in a long time  8. Irritable bowel syndrome with constipation  stable  9. Vitamin D deficiency  - VITAMIN D 25 Hydroxy (Vit-D Deficiency, Fractures)  10. Other cardiac arrhythmia  Seen by Cardiologist   11. Hives  - EPINEPHrine (EPIPEN 2-PAK) 0.3 mg/0.3 mL IJ SOAJ injection; Inject 0.3 mg into the muscle as needed for anaphylaxis.  Dispense: 2 each; Refill: 0  12. Perennial allergic rhinitis with seasonal variation   stable

## 2024-03-13 NOTE — Addendum Note (Signed)
 Addended by: Ruel Favors on: 03/13/2024 03:43 PM   Modules accepted: Level of Service

## 2024-03-14 ENCOUNTER — Other Ambulatory Visit: Payer: Self-pay

## 2024-03-15 ENCOUNTER — Telehealth: Payer: Self-pay

## 2024-03-15 NOTE — Telephone Encounter (Signed)
 Pt verbalized understanding. Pt concerned this medication is causing her heart palpitations. Would you still want her to continue this regimen?    Copied from CRM [id number]. Topic: Clinical - Medical Advice >> Mar 15, 2024 12:14 PM Gery Pray wrote: Reason for CRM: Patient wants to know if she should continue taking spironolactone (ALDACTONE) 25 MG although she is not doing the cancer treatments anymore which was affecting her potassium levels. In addition, patient stated that she looked up the medication and it is the one that was causing the heart palpitations. Please contact patient for medical advice at spironolactone (ALDACTONE) 25 MG [phone]

## 2024-03-15 NOTE — Telephone Encounter (Signed)
 Pt verbalized understanding. Pt concerned this medication is causing her heart palpitations. Would you still want her to continue this regimen?

## 2024-03-20 DIAGNOSIS — E1169 Type 2 diabetes mellitus with other specified complication: Secondary | ICD-10-CM | POA: Diagnosis not present

## 2024-03-20 DIAGNOSIS — G8929 Other chronic pain: Secondary | ICD-10-CM | POA: Diagnosis not present

## 2024-03-20 DIAGNOSIS — I341 Nonrheumatic mitral (valve) prolapse: Secondary | ICD-10-CM | POA: Diagnosis not present

## 2024-03-20 DIAGNOSIS — I10 Essential (primary) hypertension: Secondary | ICD-10-CM | POA: Diagnosis not present

## 2024-03-20 DIAGNOSIS — E785 Hyperlipidemia, unspecified: Secondary | ICD-10-CM | POA: Diagnosis not present

## 2024-03-20 DIAGNOSIS — I499 Cardiac arrhythmia, unspecified: Secondary | ICD-10-CM | POA: Diagnosis not present

## 2024-03-20 DIAGNOSIS — E782 Mixed hyperlipidemia: Secondary | ICD-10-CM | POA: Diagnosis not present

## 2024-03-20 DIAGNOSIS — Z853 Personal history of malignant neoplasm of breast: Secondary | ICD-10-CM | POA: Diagnosis not present

## 2024-03-20 DIAGNOSIS — M545 Low back pain, unspecified: Secondary | ICD-10-CM | POA: Diagnosis not present

## 2024-03-20 DIAGNOSIS — Z9221 Personal history of antineoplastic chemotherapy: Secondary | ICD-10-CM | POA: Diagnosis not present

## 2024-03-20 DIAGNOSIS — E119 Type 2 diabetes mellitus without complications: Secondary | ICD-10-CM | POA: Diagnosis not present

## 2024-03-22 ENCOUNTER — Ambulatory Visit
Admission: RE | Admit: 2024-03-22 | Discharge: 2024-03-22 | Disposition: A | Discharge status: Home / Self Care | Source: Ambulatory Visit | Attending: Internal Medicine | Admitting: Internal Medicine

## 2024-03-22 ENCOUNTER — Other Ambulatory Visit: Payer: Medicare Other

## 2024-03-22 ENCOUNTER — Ambulatory Visit
Admission: RE | Admit: 2024-03-22 | Discharge: 2024-03-22 | Disposition: A | Discharge status: Home / Self Care | Source: Ambulatory Visit | Attending: Surgery | Admitting: Surgery

## 2024-03-22 ENCOUNTER — Ambulatory Visit

## 2024-03-22 DIAGNOSIS — Z1231 Encounter for screening mammogram for malignant neoplasm of breast: Secondary | ICD-10-CM | POA: Diagnosis not present

## 2024-03-22 DIAGNOSIS — M85851 Other specified disorders of bone density and structure, right thigh: Secondary | ICD-10-CM | POA: Diagnosis not present

## 2024-03-22 DIAGNOSIS — C50411 Malignant neoplasm of upper-outer quadrant of right female breast: Secondary | ICD-10-CM

## 2024-03-22 DIAGNOSIS — Z853 Personal history of malignant neoplasm of breast: Secondary | ICD-10-CM | POA: Diagnosis not present

## 2024-03-22 DIAGNOSIS — Z9221 Personal history of antineoplastic chemotherapy: Secondary | ICD-10-CM | POA: Insufficient documentation

## 2024-03-22 DIAGNOSIS — Z13820 Encounter for screening for osteoporosis: Secondary | ICD-10-CM | POA: Insufficient documentation

## 2024-03-22 DIAGNOSIS — Z780 Asymptomatic menopausal state: Secondary | ICD-10-CM | POA: Diagnosis not present

## 2024-03-22 DIAGNOSIS — E119 Type 2 diabetes mellitus without complications: Secondary | ICD-10-CM | POA: Insufficient documentation

## 2024-03-22 DIAGNOSIS — Z923 Personal history of irradiation: Secondary | ICD-10-CM | POA: Diagnosis not present

## 2024-03-28 DIAGNOSIS — C50411 Malignant neoplasm of upper-outer quadrant of right female breast: Secondary | ICD-10-CM | POA: Diagnosis not present

## 2024-03-29 ENCOUNTER — Inpatient Hospital Stay: Payer: Medicare Other | Attending: Internal Medicine

## 2024-03-29 ENCOUNTER — Inpatient Hospital Stay (HOSPITAL_BASED_OUTPATIENT_CLINIC_OR_DEPARTMENT_OTHER): Payer: Medicare Other | Admitting: Internal Medicine

## 2024-03-29 ENCOUNTER — Encounter: Payer: Self-pay | Admitting: Internal Medicine

## 2024-03-29 VITALS — BP 110/65 | HR 74 | Temp 97.5°F | Resp 16 | Ht 66.0 in | Wt 165.8 lb

## 2024-03-29 DIAGNOSIS — Z9011 Acquired absence of right breast and nipple: Secondary | ICD-10-CM | POA: Insufficient documentation

## 2024-03-29 DIAGNOSIS — Z7982 Long term (current) use of aspirin: Secondary | ICD-10-CM | POA: Insufficient documentation

## 2024-03-29 DIAGNOSIS — Z1722 Progesterone receptor negative status: Secondary | ICD-10-CM | POA: Diagnosis not present

## 2024-03-29 DIAGNOSIS — Z171 Estrogen receptor negative status [ER-]: Secondary | ICD-10-CM | POA: Insufficient documentation

## 2024-03-29 DIAGNOSIS — Z1732 Human epidermal growth factor receptor 2 negative status: Secondary | ICD-10-CM | POA: Diagnosis not present

## 2024-03-29 DIAGNOSIS — Z87891 Personal history of nicotine dependence: Secondary | ICD-10-CM | POA: Insufficient documentation

## 2024-03-29 DIAGNOSIS — C50411 Malignant neoplasm of upper-outer quadrant of right female breast: Secondary | ICD-10-CM | POA: Insufficient documentation

## 2024-03-29 DIAGNOSIS — Z79899 Other long term (current) drug therapy: Secondary | ICD-10-CM | POA: Insufficient documentation

## 2024-03-29 LAB — CMP (CANCER CENTER ONLY)
ALT: 15 U/L (ref 0–44)
AST: 22 U/L (ref 15–41)
Albumin: 3.6 g/dL (ref 3.5–5.0)
Alkaline Phosphatase: 85 U/L (ref 38–126)
Anion gap: 5 (ref 5–15)
BUN: 11 mg/dL (ref 8–23)
CO2: 27 mmol/L (ref 22–32)
Calcium: 9 mg/dL (ref 8.9–10.3)
Chloride: 104 mmol/L (ref 98–111)
Creatinine: 1.07 mg/dL — ABNORMAL HIGH (ref 0.44–1.00)
GFR, Estimated: 55 mL/min — ABNORMAL LOW (ref >60)
Glucose, Bld: 115 mg/dL — ABNORMAL HIGH (ref 70–99)
Potassium: 4.2 mmol/L (ref 3.5–5.1)
Sodium: 136 mmol/L (ref 135–145)
Total Bilirubin: 0.6 mg/dL (ref 0.0–1.2)
Total Protein: 6.8 g/dL (ref 6.5–8.1)

## 2024-03-29 LAB — CBC WITH DIFFERENTIAL (CANCER CENTER ONLY)
Abs Immature Granulocytes: 0.01 10*3/uL (ref 0.00–0.07)
Basophils Absolute: 0 10*3/uL (ref 0.0–0.1)
Basophils Relative: 1 %
Eosinophils Absolute: 0.1 10*3/uL (ref 0.0–0.5)
Eosinophils Relative: 2 %
HCT: 37.9 % (ref 36.0–46.0)
Hemoglobin: 12 g/dL (ref 12.0–15.0)
Immature Granulocytes: 0 %
Lymphocytes Relative: 23 %
Lymphs Abs: 0.8 10*3/uL (ref 0.7–4.0)
MCH: 29.8 pg (ref 26.0–34.0)
MCHC: 31.7 g/dL (ref 30.0–36.0)
MCV: 94 fL (ref 80.0–100.0)
Monocytes Absolute: 0.2 10*3/uL (ref 0.1–1.0)
Monocytes Relative: 6 %
Neutro Abs: 2.3 10*3/uL (ref 1.7–7.7)
Neutrophils Relative %: 68 %
Platelet Count: 201 10*3/uL (ref 150–400)
RBC: 4.03 MIL/uL (ref 3.87–5.11)
RDW: 13.2 % (ref 11.5–15.5)
WBC Count: 3.4 10*3/uL — ABNORMAL LOW (ref 4.0–10.5)
nRBC: 0 % (ref 0.0–0.2)

## 2024-03-29 NOTE — Addendum Note (Signed)
 Addended by: Clydia Llano on: 03/29/2024 11:23 AM   Modules accepted: Orders

## 2024-03-29 NOTE — Progress Notes (Signed)
 No concerns today

## 2024-03-29 NOTE — Assessment & Plan Note (Addendum)
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  Patient currently s/p  adjuvant chemoimmunotherapy [KEYNOTE 522 study].   #lOCT 2024 eft breast- mammogram-  [Dr.Sakai] There are two separate 10 mm groups of coarse heterogeneous calcification in the left breast that are low suspicion for malignancy. S/p  stereotactic guided biopsy- negative. Await repeat Mammo Bil Diagnostic - Mammo[Dr.Sakai]- NED.   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 Sep 2023- vit D  40. Continue ca+vit D. stable  # DM- on  ozempic.  Monitor blood sugars closely. stable  # Hypokalemia- improved; ok to stop Kdur.  stable  # Bone health- April 2024- BMD measured at Femur Neck Right is 0.879 g/cm2 with a T-score of -1.1.   # IV access- s/p  Explantation-PIV.   # DISPOSITION: # follow up in 6 month-  MD; labs- cbc/cmp; Thyroid profile;- Dr.B

## 2024-03-29 NOTE — Progress Notes (Signed)
 [facility] CONSULT NOTE  Patient Care Team: Alba Cory, MD as PCP - General (Family Medicine) Lemar Livings, Merrily Pew, MD (General Surgery) Bud Face, MD as Referring Physician (Otolaryngology) Alwyn Pea, MD as Consulting Physician (Cardiology) Jesusita Oka, MD as Referring Physician (Dermatology) Sherlon Handing, MD as Consulting Physician (Internal Medicine) Earna Coder, MD as Consulting Physician (Internal Medicine) Sung Amabile, DO as Consulting Physician (General Surgery) [facility], [facility]  CHIEF COMPLAINTS/PURPOSE OF CONSULTATION: Breast cancer  #  Oncology History Overview Note  She has a remote history of right breast cancer, T1bN0, ER positive,s/p lumpectomy and MammoSite radiation and 5 years of Arimidex.    02/19/22 screening mammogram bilaterally showed  1. Suspicious right breast mass at the 10 o'clock position 8 cm from the nipple. Recommend ultrasound-guided biopsy. 2. Indeterminate right breast mass at the 6 o'clock position 4 cm from the nipple. Recommend ultrasound-guided biopsy. 3. No suspicious right axillary lymphadenopathy.   04/12/2022 Unilateral right breast diagnostic mammogram showed 1. Suspicious right breast mass at the 10 o'clock position 8 cm from the nipple. Recommend ultrasound-guided biopsy. 2. Indeterminate right breast mass at the 6 o'clock position 4 cm from the nipple. Recommend ultrasound-guided biopsy. 3. No suspicious right axillary lymphadenopathy.   04/13/2022 right breast mass 6 o'clock position 1 cm from the nipple showed invasive mammary carcinoma, no special type. Grade 3, DCIS present high grade, LVI not identified. ER-, PR 1-10%, HER 2 -   04/13/2022, right breast mass 10:00 7 cm from nipple biopsy showed invasive mammary carcinoma, grade 3, high-grade DCIS with focal comedonecrosis, lymphovascular invasion not identified.  ER -, PR 1 to 10%, HER2-    04/26/2022, right breast mastectomy  with axillary dissection Invasive mammary carcinoma, no special type, multifocal, DCIS high-grade, benign nipple/areola.  2 deposits of invasive mammary carcinoma, no definite residual lymph node identified.   Breast cancer (HCC)  05/10/2022 Initial Diagnosis   Breast cancer (HCC)   05/10/2022 Cancer Staging   Staging form: Breast, AJCC 8th Edition - Pathologic stage from 05/10/2022: Stage IIA (pT1c, pN1a, cM0, G3, ER-, PR+, HER2-) - Signed by Rickard Patience, MD on 05/10/2022 Stage prefix: Initial diagnosis Histologic grading system: 3 grade system   05/24/2022 - 05/24/2022 Chemotherapy   Patient is on Treatment Plan : BREAST Pembrolizumab (200) D1 + Carboplatin (5) D1 + Paclitaxel (80) D1,8,15 q21d X 4 cycles / Pembrolizumab (200) D1 + AC D1 q21d x 4 cycles     05/27/2022 - 08/13/2022 Chemotherapy   Patient is on Treatment Plan : BREAST Pembrolizumab (200) D1 + Carboplatin (5) D1 + Paclitaxel (80) D1,8,15 q21d X 4 cycles / Pembrolizumab (200) D1 + AC D1 q21d x 4 cycles     05/27/2022 - 12/07/2022 Chemotherapy   Patient is on Treatment Plan : BREAST Pembrolizumab (200) D1 + Carboplatin (5) D1 + Paclitaxel (80) D1,8,15 q21d X 4 cycles / Pembrolizumab (200) D1 + AC D1 q21d x 4 cycles     01/04/2023 -  Chemotherapy   Patient is on Treatment Plan : BREAST Pembrolizumab (200) q21d x 27 weeks     Carcinoma of upper-outer quadrant of right breast in female, estrogen receptor negative (HCC)  05/26/2022 Initial Diagnosis   Carcinoma of upper-outer quadrant of right breast in female, estrogen receptor negative (HCC)   05/26/2022 Cancer Staging   Staging form: Breast, AJCC 8th Edition - Pathologic: Stage IIA (pT1c, pN1, cM0, G3, ER-, PR-, HER2-) - Signed by Earna Coder, MD on 05/26/2022  Multigene prognostic tests performed: None Histologic grading system: 3 grade system   05/27/2022 - 12/07/2022 Chemotherapy   Patient is on Treatment Plan : BREAST Pembrolizumab (200) D1 + Carboplatin (5) D1 + Paclitaxel (80)  D1,8,15 q21d X 4 cycles / Pembrolizumab (200) D1 + AC D1 q21d x 4 cycles     01/04/2023 -  Chemotherapy   Patient is on Treatment Plan : BREAST Pembrolizumab (200) q21d x 27 weeks      HISTORY OF PRESENTING ILLNESS: Ambulating independently.  Alone.   Morgan Peterson 73 y.o.  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 but much improved.  Appetite is better.    Denies any nausea . No fever no chills.   Review of Systems  Constitutional:  Positive for malaise/fatigue. Negative for chills, diaphoresis, fever and weight loss.  HENT:  Negative for nosebleeds and sore throat.   Eyes:  Negative for double vision.  Respiratory:  Negative for cough, hemoptysis, sputum production, shortness of breath and wheezing.   Cardiovascular:  Negative for chest pain, palpitations, orthopnea and leg swelling.  Gastrointestinal:  Negative for abdominal pain, blood in stool, constipation, diarrhea, heartburn, melena, nausea and vomiting.  Genitourinary:  Negative for dysuria, frequency and urgency.  Musculoskeletal:  Negative for back pain and joint pain.  Skin: Negative.  Negative for itching and rash.  Neurological:  Positive for tingling. Negative for dizziness, focal weakness, weakness and headaches.  Endo/Heme/Allergies:  Does not bruise/bleed easily.  Psychiatric/Behavioral:  Negative for depression. The patient is not nervous/anxious and does not have insomnia.      MEDICAL HISTORY:  Past Medical History:  Diagnosis Date   Appetite loss    Asthma    Cervical radiculopathy    Chemotherapy-induced nausea    Hiatal hernia    Hypercholesterolemia    Hyperlipidemia    Hypertension    IBS (irritable bowel syndrome)    Malignant neoplasm of upper-outer quadrant of female breast (HCC) 04/11/2009    Right breast, invasive ductal carcinoma, 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 Arimidex therapy completed September 2015   Mild mitral valve prolapse    Neuropathy    feet, from chemo   Personal history of malignant neoplasm of breast 2010   Personal history of radiation therapy 2010   mammosite   T2DM (type 2 diabetes mellitus) (HCC) 2011    SURGICAL HISTORY: Past Surgical History:  Procedure Laterality Date   ABDOMINAL EXPLORATION SURGERY  2000   ovarian cyst   BREAST BIOPSY Right 2010   +    BREAST BIOPSY Left 04/06/2023   Left Breast Stereo Bx X Clip - path pending   BREAST BIOPSY Left 04/06/2023   Left Breast Stereo Bx, Coil Clip - path pending   BREAST BIOPSY Left 04/06/2023   MM LT BREAST BX W LOC DEV 1ST LESION IMAGE BX SPEC STEREO GUIDE 04/06/2023 [facility]   BREAST BIOPSY Left 04/06/2023   MM LT BREAST BX W LOC DEV EA AD LESION IMG BX SPEC STEREO GUIDE 04/06/2023 [facility]   BREAST EXCISIONAL BIOPSY Right 2010   + mammo site inasive mammo ca   BREAST LUMPECTOMY Right 2010   [facility]   COLONOSCOPY  07/19/2012   Normal exam, Dr. Lemar Livings   COLONOSCOPY WITH PROPOFOL N/A 04/21/2022   Procedure:  COLONOSCOPY WITH PROPOFOL;  Surgeon: Earline Mayotte, MD;  Location: [facility];  Service: Endoscopy;  Laterality: N/A;   ESOPHAGOGASTRODUODENOSCOPY (EGD) WITH PROPOFOL N/A 10/22/2022   Procedure: ESOPHAGOGASTRODUODENOSCOPY (EGD) WITH PROPOFOL;  Surgeon: Midge Minium, MD;  Location: [facility];  Service: Endoscopy;  Laterality: N/A;  Diabetic   MASTECTOMY Right 2010   partial/ lumpectomy   PORTACATH PLACEMENT Left 05/19/2022   Procedure: INSERTION PORT-A-CATH;  Surgeon: Earline Mayotte, MD;  Location: [facility];  Service: General;  Laterality: Left;   SIMPLE MASTECTOMY WITH AXILLARY SENTINEL NODE BIOPSY Right 04/26/2022   Procedure: SIMPLE MASTECTOMY WITH AXILLARY SENTINEL NODE BIOPSY;  Surgeon: Earline Mayotte, MD;  Location: [facility];  Service: General;  Laterality: Right;  RNFA to assist   TUBAL LIGATION  20 years ago    SOCIAL HISTORY: Social History   Socioeconomic History   Marital status: Married    Spouse name: Antonio   Number of children: 2   Years of education: Not on file   Highest education level: Not on file  Occupational History   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  Tobacco Use   Smoking status: Former    Current packs/day: 0.00    Average packs/day: 0.3 packs/day for 10.0 years (2.5 ttl pk-yrs)    Types: Cigarettes    Start date: 85    Quit date: 1988    Years since quitting: 37.2   Smokeless tobacco: Never   Tobacco comments:    smoking cessation materials not required  Vaping Use   Vaping status: Never Used  Substance and Sexual Activity   Alcohol use: Yes    Comment: wine occ   Drug use: No   Sexual activity: Not Currently    Comment: husband has ED  Other Topics Concern   Not on file  Social History Narrative   Oncology nurse on the floor retired.;  Husband retired from [employer].  No smoking.   Social Drivers of Corporate investment banker Strain: Low Risk  (04/07/2023)   Overall Financial Resource Strain (CARDIA)    Difficulty of Paying Living Expenses: Not hard at all  Food Insecurity: No Food Insecurity (04/07/2023)   Hunger Vital Sign    Worried About Running Out of Food in the Last Year: Never true    Ran Out of Food in the Last Year: Never true  Transportation Needs: No Transportation Needs (04/07/2023)   PRAPARE - Administrator, Civil Service (Medical): No    Lack of Transportation (Non-Medical): No  Physical Activity: Insufficiently Active (04/07/2023)   Exercise Vital Sign    Days of Exercise per Week: 1 day    Minutes of Exercise per Session: 20 min  Stress: No Stress Concern Present (04/07/2023)   [employer] of Occupational Health - Occupational Stress Questionnaire    Feeling of Stress : Not at  all  Social Connections: Moderately Integrated (04/07/2023)   Social Connection and Isolation Panel [NHANES]    Frequency of Communication with Friends and Family: More than three times a week    Frequency of Social Gatherings with Friends and Family: Three times a week    Attends Religious Services: More than 4 times per year    Active Member of Clubs or Organizations: No    Attends Banker Meetings: Never    Marital Status: Married  Catering manager Violence: Not At Risk (04/07/2023)   Humiliation, Afraid, Rape, and Kick questionnaire    Fear  of Current or Ex-Partner: No    Emotionally Abused: No    Physically Abused: No    Sexually Abused: No    FAMILY HISTORY: Family History  Problem Relation Age of Onset   Osteoporosis Mother    Diabetes Father    Kidney disease Father    Diabetes Brother    Breast cancer Maternal Aunt    Bladder Cancer Maternal Aunt    Cervical cancer Maternal Aunt    Colon cancer Maternal Uncle    Lung cancer Maternal Uncle     ALLERGIES:  is allergic to levaquin [levofloxacin in d5w], losartan, metoprolol, saxagliptin, amlodipine-olmesartan, canagliflozin, egg white [egg white (egg protein)], gabapentin, glipizide, gluten meal, lisinopril, metformin and related, milk (cow), milk-related compounds, shrimp extract, tramadol, zyprexa [olanzapine], actos [pioglitazone], atorvastatin, carvedilol, and prednisone.  MEDICATIONS:  Current Outpatient Medications  Medication Sig Dispense Refill   aspirin EC 81 MG tablet Take 1 tablet (81 mg total) by mouth daily. 30 tablet 0   atenolol (TENORMIN) 25 MG tablet Take 12.5 mg by mouth daily.     azelastine (ASTELIN) 0.1 % nasal spray Place 1 spray into both nostrils 2 (two) times daily. Use in each nostril as directed 90 mL 1   betamethasone dipropionate 0.05 % lotion Apply 1 Application topically daily.     betamethasone, augmented, (DIPROLENE) 0.05 % lotion Apply 1 application  topically 2 (two) times  daily.     Blood Glucose Monitoring Suppl (ONETOUCH VERIO) w/Device KIT 1 Device by Does not apply route once. 1 kit 0   Cyanocobalamin (VITAMIN B-12 PO) Take 1 tablet by mouth daily.     EPINEPHrine (EPIPEN 2-PAK) 0.3 mg/0.3 mL IJ SOAJ injection Inject 0.3 mg into the muscle as needed for anaphylaxis. 2 each 0   famotidine (PEPCID) 20 MG tablet Take 1 tablet (20 mg total) by mouth at bedtime. 90 tablet 1   glucose blood (ONETOUCH VERIO) test strip Use as instructed 100 each 12   ketoconazole (NIZORAL) 2 % shampoo Apply 1 Application topically 3 (three) times a week.     Lancets (ONETOUCH ULTRASOFT) lancets Use as instructed 100 each 3   levocetirizine (XYZAL) 5 MG tablet Take 1 tablet (5 mg total) by mouth daily. 90 tablet 1   minoxidil (LONITEN) 2.5 MG tablet Take 2.5 mg by mouth daily.     montelukast (SINGULAIR) 10 MG tablet TAKE 1 TABLET BY MOUTH EVERYDAY AT BEDTIME 90 tablet 1   Multiple Vitamin (MULTIVITAMIN WITH MINERALS) TABS tablet Take 1 tablet by mouth 2 (two) times a week.     omeprazole (PRILOSEC) 40 MG capsule Take 1 capsule (40 mg total) by mouth daily. 90 capsule 1   OZEMPIC, 0.25 OR 0.5 MG/DOSE, 2 MG/1.5ML SOPN Inject 0.5 mg into the skin every Sunday.     polyethylene glycol (MIRALAX / GLYCOLAX) 17 g packet Take 17 g by mouth daily.     prednisoLONE acetate (PRED FORTE) 1 % ophthalmic suspension Place 1 drop into the left eye.     rosuvastatin (CRESTOR) 20 MG tablet Take 1 tablet (20 mg total) by mouth every morning. 90 tablet 1   spironolactone (ALDACTONE) 25 MG tablet Take 1 tablet (25 mg total) by mouth daily. 90 tablet 1   Vitamin D, Ergocalciferol, (DRISDOL) 1.25 MG (50000 UNIT) CAPS capsule Take 50,000 Units by mouth once a week.     cholecalciferol (VITAMIN D) 1000 UNITS tablet Take 1,000 Units by mouth daily. (Patient not taking: Reported on 03/29/2024)  No current facility-administered medications for this visit.   Facility-Administered Medications Ordered in  Other Visits  Medication Dose Route Frequency Provider Last Rate Last Admin   heparin lock flush 100 UNIT/ML injection            heparin lock flush 100 unit/mL  500 Units Intravenous Once Earna Coder, MD          .  PHYSICAL EXAMINATION: ECOG PERFORMANCE STATUS: 0 - Asymptomatic  Vitals:   03/29/24 0951  BP: 110/65  Pulse: 74  Resp: 16  Temp: (!) 97.5 F (36.4 C)  SpO2: 100%       Filed Weights   03/29/24 0951  Weight: 165 lb 12.8 oz (75.2 kg)        Physical Exam Vitals and nursing note reviewed.  HENT:     Head: Normocephalic and atraumatic.     Mouth/Throat:     Pharynx: Oropharynx is clear.  Eyes:     Extraocular Movements: Extraocular movements intact.     Pupils: Pupils are equal, round, and reactive to light.  Cardiovascular:     Rate and Rhythm: Normal rate and regular rhythm.  Pulmonary:     Comments: Decreased breath sounds bilaterally.  Abdominal:     Palpations: Abdomen is soft.  Musculoskeletal:        General: Normal range of motion.     Cervical back: Normal range of motion.  Skin:    General: Skin is warm.  Neurological:     General: No focal deficit present.     Mental Status: She is alert and oriented to person, place, and time.  Psychiatric:        Behavior: Behavior normal.        Judgment: Judgment normal.      LABORATORY DATA:  I have reviewed the data as listed Lab Results  Component Value Date   WBC 3.4 (L) 03/29/2024   HGB 12.0 03/29/2024   HCT 37.9 03/29/2024   MCV 94.0 03/29/2024   PLT 201 03/29/2024   Recent Labs    06/21/23 0931 09/21/23 1006 03/29/24 0939  NA 136 136 136  K 4.2 4.7 4.2  CL 103 102 104  CO2 24 25 27   GLUCOSE 89 101* 115*  BUN 22 13 11   CREATININE 1.04* 0.95 1.07*  CALCIUM 9.2 9.4 9.0  GFRNONAA 57* >60 55*  PROT 7.3 7.2 6.8  ALBUMIN 3.6 3.7 3.6  AST 17 17 22   ALT 14 14 15   ALKPHOS 81 83 85  BILITOT 0.5 0.5 0.6    RADIOGRAPHIC STUDIES: I have personally reviewed the  radiological images as listed and agreed with the findings in the report. MM 3D SCREENING MAMMOGRAM UNILATERAL LEFT BREAST Result Date: 03/26/2024 CLINICAL DATA:  Screening. EXAM: DIGITAL SCREENING UNILATERAL LEFT MAMMOGRAM WITH CAD AND TOMOSYNTHESIS TECHNIQUE: Left screening digital craniocaudal and mediolateral oblique mammograms were obtained. Left screening digital breast tomosynthesis was performed. The images were evaluated with computer-aided detection. COMPARISON:  Previous exam(s). ACR Breast Density Category b: There are scattered areas of fibroglandular density. FINDINGS: There are no findings suspicious for malignancy. IMPRESSION: No mammographic evidence of malignancy. A result letter of this screening mammogram will be mailed directly to the patient. RECOMMENDATION: Screening mammogram in one year. (Code:SM-B-01Y) BI-RADS CATEGORY  1: Negative. Electronically Signed   By: Elberta Fortis M.D.   On: 03/26/2024 08:20   DG Bone Density Result Date: 03/22/2024 EXAM: DUAL X-RAY ABSORPTIOMETRY (DXA) FOR BONE MINERAL DENSITY IMPRESSION: Your patient Elizabella Nolet  completed a BMD test on 03/22/2024 using the [employer] DXA System (software version: 14.10) manufactured by [employer]. The following summarizes the results of our evaluation. Technologist: [facility] PATIENT BIOGRAPHICAL: Name: Keyia, Moretto Patient ID: [id number] Birth Date: 1951/03/29 Height: 66.0 in. Gender: Female Exam Date: 03/22/2024 Weight: 163.6 lbs. Indications: Advanced Age, Diabetic, History of Breast Cancer, History of Chemo, History of Radiation, Postmenopausal Fractures: Treatments: ozempic, Vitamin D DENSITOMETRY RESULTS: Site         Region     Measured Date Measured Age WHO Classification Young Adult T-score BMD         %Change vs. Previous Significant Change (*) DualFemur Neck Right 03/22/2024 72.4 Osteopenia -1.1 0.879 g/cm2 -7.9% Yes DualFemur Neck Right 09/04/2014 62.9 Normal -0.6 0.954 g/cm2 3.2% - DualFemur Neck  Right 11/08/2012 61.1 Normal -0.8 0.924 g/cm2 -1.5% - DualFemur Neck Right 06/17/2009 57.7 Normal -0.7 0.938 g/cm2 - - DualFemur Total Mean 03/22/2024 72.4 Normal -0.1 0.994 g/cm2 -5.0% Yes DualFemur Total Mean 09/04/2014 62.9 Normal 0.3 1.046 g/cm2 1.9% Yes DualFemur Total Mean 11/08/2012 61.1 Normal 0.1 1.026 g/cm2 -3.1% Yes DualFemur Total Mean 06/17/2009 57.7 Normal 0.4 1.059 g/cm2 - - Left Forearm Radius 33% 03/22/2024 72.4 Normal 0.6 0.933 g/cm2 6.3% Yes Left Forearm Radius 33% 09/04/2014 62.9 Normal 0.0 0.878 g/cm2 -3.6% - Left Forearm Radius 33% 11/08/2012 61.1 Normal 0.4 0.911 g/cm2 4.0% - Left Forearm Radius 33% 06/17/2009 57.7 Normal 0.0 0.876 g/cm2 - - ASSESSMENT: The BMD measured at Femur Neck Right is 0.879 g/cm2 with a T-score of -1.1. This patient is considered osteopenic according to World Health Organization [facility]) criteria. The scan quality is good. Lumbar spine was not utilized due to advanced degenerative changes. Compared with prior study, there has been significant decrease in the total hip. World Science writer [facility]) criteria for post-menopausal, Caucasian Women: Normal:                   T-score at or above -1 SD Osteopenia/low bone mass: T-score between -1 and -2.5 SD Osteoporosis:             T-score at or below -2.5 SD RECOMMENDATIONS: 1. All patients should optimize calcium and vitamin D intake. 2. Consider FDA-approved medical therapies in postmenopausal women and men aged 8 years and older, based on the following: a. A hip or vertebral(clinical or morphometric) fracture b. T-score < -2.5 at the femoral neck or spine after appropriate evaluation to exclude secondary causes c. Low bone mass (T-score between -1.0 and -2.5 at the femoral neck or spine) and a 10-year probability of a hip fracture > 3% or a 10-year probability of a major osteoporosis-related fracture > 20% based on the [place]-adapted WHO algorithm 3. Clinician judgment and/or patient preferences may indicate treatment for  people with 10-year fracture probabilities above or below these levels FOLLOW-UP: People with diagnosed cases of osteoporosis or at high risk for fracture should have regular bone mineral density tests. For patients eligible for Medicare, routine testing is allowed once every 2 years. The testing frequency can be increased to one year for patients who have rapidly progressing disease, those who are receiving or discontinuing medical therapy to restore bone mass, or have additional risk factors. I have reviewed this report, and agree with the above findings. [facility] Dear Dr. Donneta Romberg, Your patient Morgan Peterson completed a FRAX assessment on 03/22/2024 using the [facility] iDXA DXA System (analysis version: 14.10) manufactured by [employer]. The following summarizes the results of  our evaluation. PATIENT BIOGRAPHICAL: Name: Jeanet, Lupe Patient ID: [id number] Birth Date: May 11, 1951 Height:    66.0 in. Gender:     [age]    [age]       Weight:    163.6 lbs. Ethnicity:  Black                            Exam Date: 03/22/2024 FRAX* RESULTS:  (version: 3.5) 10-year Probability of Fracture1 Major Osteoporotic Fracture2 Hip Fracture 4.3% 0.6% Population: [place] (Black) Risk Factors: None Based on Femur (Right) Neck BMD 1 -The 10-year probability of fracture may be lower than reported if the patient has received treatment. 2 -Major Osteoporotic Fracture: Clinical Spine, Forearm, Hip or Shoulder *FRAX is a Armed forces logistics/support/administrative officer of the [employer] of [employer] for Metabolic Bone Disease, a World Science writer (WHO) [employer]. ASSESSMENT: The probability of a major osteoporotic fracture is 4.3% within the next ten years. The probability of a hip fracture is 0.6% within the next ten years. . Electronically Signed   By: Frederico Hamman M.D.   On: 03/22/2024 11:32    ASSESSMENT & PLAN:   Carcinoma of upper-outer quadrant of right breast in female, estrogen  receptor negative (HCC) # Stage II triple negative breast cancer [mT1cN1; Grade 3 ]- s/p mastectomy.  Patient currently s/p  adjuvant chemoimmunotherapy [KEYNOTE 522 study].   #lOCT 2024 eft breast- mammogram-  [Dr.Sakai] There are two separate 10 mm groups of coarse heterogeneous calcification in the left breast that are low suspicion for malignancy. S/p  stereotactic guided biopsy- negative. Await repeat Mammo Bil Diagnostic - Mammo[Dr.Sakai]- NED.   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 Sep 2023- vit D  40. Continue ca+vit D. stable  # DM- on  ozempic.  Monitor blood sugars closely. stable  # Hypokalemia- improved; ok to stop Kdur.  stable  # Bone health- April 2024- BMD measured at Femur Neck Right is 0.879 g/cm2 with a T-score of -1.1.   # IV access- s/p  Explantation-PIV.   # DISPOSITION: # follow up in 6 month-  MD; labs- cbc/cmp; Thyroid profile;- Dr.B    All questions were answered. The patient/family knows to call the clinic with any problems, questions or concerns.    Earna Coder, MD 03/29/2024 10:53 AM

## 2024-03-30 ENCOUNTER — Other Ambulatory Visit: Payer: Self-pay

## 2024-03-30 LAB — THYROID PANEL WITH TSH
Free Thyroxine Index: 1.7 (ref 1.2–4.9)
T3 Uptake Ratio: 26 % (ref 24–39)
T4, Total: 6.7 ug/dL (ref 4.5–12.0)
TSH: 1.35 u[IU]/mL (ref 0.450–4.500)

## 2024-04-04 ENCOUNTER — Other Ambulatory Visit: Payer: Self-pay | Admitting: Family Medicine

## 2024-04-04 DIAGNOSIS — J302 Other seasonal allergic rhinitis: Secondary | ICD-10-CM

## 2024-04-06 ENCOUNTER — Other Ambulatory Visit: Payer: Self-pay

## 2024-04-12 ENCOUNTER — Ambulatory Visit: Payer: Self-pay

## 2024-04-12 DIAGNOSIS — E1169 Type 2 diabetes mellitus with other specified complication: Secondary | ICD-10-CM | POA: Diagnosis not present

## 2024-04-12 DIAGNOSIS — Z0000 Encounter for general adult medical examination without abnormal findings: Secondary | ICD-10-CM

## 2024-04-12 DIAGNOSIS — Z0001 Encounter for general adult medical examination with abnormal findings: Secondary | ICD-10-CM | POA: Diagnosis not present

## 2024-04-12 DIAGNOSIS — E785 Hyperlipidemia, unspecified: Secondary | ICD-10-CM

## 2024-04-12 DIAGNOSIS — Z7985 Long-term (current) use of injectable non-insulin antidiabetic drugs: Secondary | ICD-10-CM

## 2024-04-12 NOTE — Progress Notes (Signed)
 Subjective:   Morgan Peterson is a 73 y.o. who presents for a Medicare Wellness preventive visit.  Visit Complete: Virtual I connected with  Morgan Peterson on 04/12/24 by a audio enabled telemedicine application and verified that I am speaking with the correct person using two identifiers.  Patient Location: Home  Provider Location: Office/Clinic  I discussed the limitations of evaluation and management by telemedicine. The patient expressed understanding and agreed to proceed.  Vital Signs: Because this visit was a virtual/telehealth visit, some criteria may be missing or patient reported. Any vitals not documented were not able to be obtained and vitals that have been documented are patient reported.  VideoDeclined- This patient declined Librarian, academic. Therefore the visit was completed with audio only.  Persons Participating in Visit: Patient.  AWV Questionnaire: No: Patient Medicare AWV questionnaire was not completed prior to this visit.  Cardiac Risk Factors include: advanced age (>13men, >60 women);diabetes mellitus;dyslipidemia;hypertension     Objective:    Today's Vitals   04/12/24 1054  PainSc: 0-No pain   There is no height or weight on file to calculate BMI.     04/12/2024   11:01 AM 03/29/2024    9:51 AM 09/21/2023   10:05 AM 06/21/2023    9:36 AM 05/31/2023   10:34 AM 05/10/2023    9:46 AM 04/19/2023   10:00 AM  Advanced Directives  Does Patient Have a Medical Advance Directive? No No No No No No No  Does patient want to make changes to medical advance directive?  No - Patient declined No - Patient declined  No - Patient declined No - Patient declined No - Patient declined  Would patient like information on creating a medical advance directive? No - Patient declined     No - Patient declined     Current Medications (verified) Outpatient Encounter Medications as of 04/12/2024  Medication Sig   aspirin  EC 81 MG tablet Take 1  tablet (81 mg total) by mouth daily.   atenolol  (TENORMIN ) 25 MG tablet Take 12.5 mg by mouth daily.   Azelastine  HCl 137 MCG/SPRAY SOLN PLACE 1 SPRAY INTO BOTH NOSTRILS 2 (TWO) TIMES DAILY. USE IN EACH NOSTRIL AS DIRECTED   betamethasone dipropionate 0.05 % lotion Apply 1 Application topically daily.   betamethasone, augmented, (DIPROLENE) 0.05 % lotion Apply 1 application  topically 2 (two) times daily.   Blood Glucose Monitoring Suppl (ONETOUCH VERIO) w/Device KIT 1 Device by Does not apply route once.   Cyanocobalamin (VITAMIN B-12 PO) Take 1 tablet by mouth daily.   EPINEPHrine  (EPIPEN  2-PAK) 0.3 mg/0.3 mL IJ SOAJ injection Inject 0.3 mg into the muscle as needed for anaphylaxis.   famotidine  (PEPCID ) 20 MG tablet Take 1 tablet (20 mg total) by mouth at bedtime.   glucose blood (ONETOUCH VERIO) test strip Use as instructed   ketoconazole (NIZORAL) 2 % shampoo Apply 1 Application topically 3 (three) times a week.   Lancets (ONETOUCH ULTRASOFT) lancets Use as instructed   levocetirizine (XYZAL ) 5 MG tablet Take 1 tablet (5 mg total) by mouth daily.   minoxidil (LONITEN) 2.5 MG tablet Take 2.5 mg by mouth daily.   montelukast  (SINGULAIR ) 10 MG tablet TAKE 1 TABLET BY MOUTH EVERYDAY AT BEDTIME   Multiple Vitamin (MULTIVITAMIN WITH MINERALS) TABS tablet Take 1 tablet by mouth 2 (two) times a week.   omeprazole  (PRILOSEC) 40 MG capsule Take 1 capsule (40 mg total) by mouth daily.   OZEMPIC, 0.25 OR 0.5 MG/DOSE,  2 MG/1.5ML SOPN Inject 0.5 mg into the skin every Sunday.   prednisoLONE acetate (PRED FORTE) 1 % ophthalmic suspension Place 1 drop into the left eye.   rosuvastatin  (CRESTOR ) 20 MG tablet Take 1 tablet (20 mg total) by mouth every morning.   spironolactone  (ALDACTONE ) 25 MG tablet Take 1 tablet (25 mg total) by mouth daily.   Vitamin D , Ergocalciferol , (DRISDOL ) 1.25 MG (50000 UNIT) CAPS capsule Take 50,000 Units by mouth once a week.   polyethylene glycol (MIRALAX  / GLYCOLAX ) 17 g  packet Take 17 g by mouth daily. (Patient not taking: Reported on 04/12/2024)   [DISCONTINUED] cholecalciferol (VITAMIN D ) 1000 UNITS tablet Take 1,000 Units by mouth daily. (Patient not taking: Reported on 04/12/2024)   Facility-Administered Encounter Medications as of 04/12/2024  Medication   heparin  lock flush 100 UNIT/ML injection   heparin  lock flush 100 unit/mL    Allergies (verified) Levaquin [levofloxacin in d5w], Losartan, Metoprolol, Saxagliptin, Amlodipine-olmesartan, Canagliflozin, Egg white [egg white (egg protein)], Gabapentin , Glipizide, Gluten meal, Lisinopril, Metformin  and related, Milk (cow), Milk-related compounds, Shrimp extract, Tramadol, Zyprexa  [olanzapine ], Actos [pioglitazone], Atorvastatin , Carvedilol , and Prednisone    History: Past Medical History:  Diagnosis Date   Appetite loss    Asthma    Cervical radiculopathy    Chemotherapy-induced nausea    Hiatal hernia    Hypercholesterolemia    Hyperlipidemia    Hypertension    IBS (irritable bowel syndrome)    Malignant neoplasm of upper-outer quadrant of female breast (HCC) 04/11/2009   Right breast, invasive ductal carcinoma, 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 Arimidex therapy completed September 2015   Mild mitral valve prolapse    Neuropathy    feet, from chemo   Personal history of malignant neoplasm of breast 2010   Personal history of radiation therapy 2010   mammosite   T2DM (type 2 diabetes mellitus) (HCC) 2011   Past Surgical History:  Procedure Laterality Date   ABDOMINAL EXPLORATION SURGERY  2000   ovarian cyst   BREAST BIOPSY Right 2010   +    BREAST BIOPSY Left 04/06/2023   Left Breast Stereo Bx X Clip - path pending   BREAST BIOPSY Left 04/06/2023   Left Breast Stereo Bx, Coil Clip - path pending   BREAST BIOPSY Left 04/06/2023   MM LT BREAST BX W LOC DEV 1ST LESION IMAGE BX SPEC STEREO GUIDE 04/06/2023 [facility]   BREAST  BIOPSY Left 04/06/2023   MM LT BREAST BX W LOC DEV EA AD LESION IMG BX SPEC STEREO GUIDE 04/06/2023 [facility]   BREAST EXCISIONAL BIOPSY Right 2010   + mammo site inasive mammo ca   BREAST LUMPECTOMY Right 2010   [facility]   COLONOSCOPY  07/19/2012   Normal exam, Dr. Marquita Situ   COLONOSCOPY WITH PROPOFOL  N/A 04/21/2022   Procedure: COLONOSCOPY WITH PROPOFOL ;  Surgeon: Marshall Skeeter, MD;  Location: [facility];  Service: Endoscopy;  Laterality: N/A;   ESOPHAGOGASTRODUODENOSCOPY (EGD) WITH PROPOFOL  N/A 10/22/2022   Procedure: ESOPHAGOGASTRODUODENOSCOPY (EGD) WITH PROPOFOL ;  Surgeon: Marnee Sink, MD;  Location: [facility];  Service: Endoscopy;  Laterality: N/A;  Diabetic   MASTECTOMY Right 2010   partial/ lumpectomy   PORTACATH PLACEMENT Left 05/19/2022   Procedure: INSERTION PORT-A-CATH;  Surgeon: Marshall Skeeter, MD;  Location: [facility];  Service: General;  Laterality: Left;   SIMPLE MASTECTOMY WITH AXILLARY SENTINEL NODE BIOPSY Right 04/26/2022   Procedure: SIMPLE MASTECTOMY WITH AXILLARY SENTINEL NODE  BIOPSY;  Surgeon: Marshall Skeeter, MD;  Location: [facility];  Service: General;  Laterality: Right;  RNFA to assist   TUBAL LIGATION  20 years ago   Family History  Problem Relation Age of Onset   Osteoporosis Mother    Diabetes Father    Kidney disease Father    Diabetes Brother    Breast cancer Maternal Aunt    Bladder Cancer Maternal Aunt    Cervical cancer Maternal Aunt    Colon cancer Maternal Uncle    Lung cancer Maternal Uncle    Social History   Socioeconomic History   Marital status: Married    Spouse name: Antonio   Number of children: 2   Years of education: Not on file   Highest education level: Not on file  Occupational History   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;  Husband retired from [employer].  No smoking.   Social Drivers of Corporate investment banker Strain: Low Risk  (04/12/2024)   Overall Financial Resource Strain (CARDIA)    Difficulty of Paying Living Expenses: Not hard at all  Food Insecurity: No Food Insecurity (04/12/2024)   Hunger Vital Sign    Worried About Running Out of Food in the Last Year: Never true    Ran Out of Food in the Last Year: Never true  Transportation Needs: No Transportation Needs (04/12/2024)   PRAPARE - Administrator, Civil Service (Medical): No    Lack of Transportation (Non-Medical): No  Physical Activity: Sufficiently Active (04/12/2024)   Exercise Vital Sign    Days of Exercise per Week: 3 days    Minutes of Exercise per Session: 60 min  Stress: No Stress Concern Present (04/12/2024)   [employer] of Occupational Health - Occupational Stress Questionnaire    Feeling of Stress : Not at all  Social Connections: Moderately Isolated (04/12/2024)   Social Connection and Isolation Panel [NHANES]    Frequency of Communication with Friends and Family: More than three times a week    Frequency of Social Gatherings with Friends and Family: Twice a week    Attends Religious Services: Never    Database administrator or Organizations: No    Attends Engineer, structural: Never    Marital Status: Married    Tobacco Counseling Counseling given: Not Answered Tobacco comments: smoking cessation materials not required    Clinical Intake:  Pre-visit  preparation completed: Yes  Pain : No/denies pain Pain Score: 0-No pain     BMI - recorded: 26.6 Nutritional Status: BMI 25 -29 Overweight Nutritional Risks: None Diabetes: Yes CBG done?: No Did pt. bring in CBG monitor from home?: No  Lab Results  Component  Value Date   HGBA1C 6.6 02/23/2024   HGBA1C 6.2 08/24/2023   HGBA1C 6.6 10/28/2022     How often do you need to have someone help you when you read instructions, pamphlets, or other written materials from your doctor or pharmacy?: 1 - Never  Interpreter Needed?: No  Information entered by :: Dellie Fergusson, LPN   Activities of Daily Living     04/12/2024   11:02 AM 10/11/2023    9:45 AM  In your present state of health, do you have any difficulty performing the following activities:  Hearing? 0 0  Vision? 0 0  Difficulty concentrating or making decisions? 0 0  Walking or climbing stairs? 1 0  Comment NEUROPATHY SOMETIMES   Dressing or bathing? 0 0  Doing errands, shopping? 0 0  Preparing Food and eating ? N   Using the Toilet? N   In the past six months, have you accidently leaked urine? N   Do you have problems with loss of bowel control? N   Managing your Medications? N   Managing your Finances? N   Housekeeping or managing your Housekeeping? N     Patient Care Team: Sowles, [name], MD as PCP - General (Family Medicine) Marquita Situ, Magali Schmitz, MD (General Surgery) Rogers Clayman, MD as Referring Physician (Otolaryngology) Antonette Batters, MD as Consulting Physician (Cardiology) Mariane Shire, MD as Referring Physician (Dermatology) Berton Brock, MD as Consulting Physician (Internal Medicine) Gwyn Leos, MD as Consulting Physician (Internal Medicine) Conrado Delay, DO as Consulting Physician (General Surgery) [facility]  Indicate any recent Medical Services you may have received from other than [facility] providers in the past year (date may be approximate).      Assessment:   This is a routine wellness examination for Morgan Peterson.  Hearing/Vision screen Hearing Screening - Comments:: NO AIDS Vision Screening - Comments:: WEARS CONTACTS/GLASSES- DR.[email or web address]   Goals Addressed             This Visit's Progress    DIET - EAT MORE FRUITS AND VEGETABLES         Depression Screen     04/12/2024   10:59 AM 01/27/2024   10:23 AM 10/11/2023    9:45 AM 04/12/2023    9:55 AM 04/07/2023   10:45 AM 08/27/2022    3:35 PM 08/05/2022   11:37 AM  PHQ 2/9 Scores  PHQ - 2 Score 0 0 0 0 0 0 0  PHQ- 9 Score 0 0 0 0  1 3    Fall Risk     04/12/2024   11:01 AM 01/27/2024   10:22 AM 10/11/2023    9:45 AM 04/12/2023    9:55 AM 04/07/2023   10:40 AM  Fall Risk   Falls in the past year? 0 0 0 0 0  Number falls in past yr: 0 0 0 0 0  Injury with Fall? 0 0 0 0 0  Risk for fall due to : No Fall Risks No Fall Risks No Fall Risks No Fall Risks No Fall Risks  Follow up Falls prevention discussed;Falls evaluation completed Falls prevention discussed;Education provided;Falls evaluation completed Falls prevention discussed Falls prevention discussed Education provided;Falls prevention discussed    MEDICARE RISK AT HOME:  Medicare Risk at Home Any stairs in or around the home?: Yes If so, are there any without handrails?: No Home free of loose throw rugs in walkways, pet beds, electrical cords, etc?: Yes Adequate lighting in your home to reduce risk  of falls?: Yes Life alert?: No Use of a cane, walker or w/c?: No Grab bars in the bathroom?: Yes Shower chair or bench in shower?: Yes Elevated toilet seat or a handicapped toilet?: Yes  TIMED UP AND GO:  Was the test performed?  No  Cognitive Function: 6CIT completed        04/12/2024   11:03 AM 04/07/2023   10:49 AM  6CIT Screen  What Year? 0 points 0 points  What month? 0 points 0 points  What time? 0 points 0 points  Count back from 20 0 points 0 points  Months in reverse 0 points 0 points  Repeat phrase  0 points 0 points  Total Score 0 points 0 points    Immunizations Immunization History  Administered Date(s) Administered   Fluad Quad(high Dose 65+) 10/09/2019, 10/13/2020   Fluad Trivalent(High Dose 65+) 10/11/2023   Influenza Split 11/13/2012   Influenza, High Dose Seasonal PF 08/18/2017, 10/13/2018, 10/13/2021   Influenza, Seasonal, Injecte, Preservative Fre 11/19/2010, 08/17/2011   Influenza,inj,Quad PF,6+ Mos 11/07/2013, 11/21/2014, 10/03/2015, 09/03/2016   Influenza-Unspecified 11/07/2013, 11/21/2014, 10/03/2015, 09/03/2016   PFIZER(Purple Top)SARS-COV-2 Vaccination 03/31/2020, 04/23/2020, 12/10/2020   PPD Test 07/22/2021   Pneumococcal Conjugate-13 05/14/2014, 11/06/2019   Pneumococcal Polysaccharide-23 11/19/2010, 11/26/2016   Tdap 11/19/2010, 01/20/2021   Zoster, Live 07/23/2013    Screening Tests Health Maintenance  Topic Date Due   COVID-19 Vaccine (4 - 2024-25 season) 08/21/2023   Diabetic kidney evaluation - Urine ACR  04/11/2024   FOOT EXAM  04/11/2024   Zoster Vaccines- Shingrix  (1 of 2) 04/25/2024 (Originally 09/25/1970)   OPHTHALMOLOGY EXAM  05/24/2024   INFLUENZA VACCINE  07/20/2024   HEMOGLOBIN A1C  08/25/2024   MAMMOGRAM  03/22/2025   Diabetic kidney evaluation - eGFR measurement  03/29/2025   Medicare Annual Wellness (AWV)  04/12/2025   DEXA SCAN  03/22/2029   DTaP/Tdap/Td (3 - Td or Tdap) 01/20/2031   Colonoscopy  04/21/2032   Pneumonia Vaccine 13+ Years old  Completed   Hepatitis C Screening  Completed   HPV VACCINES  Aged Out   Meningococcal B Vaccine  Aged Out    Health Maintenance  Health Maintenance Due  Topic Date Due   COVID-19 Vaccine (4 - 2024-25 season) 08/21/2023   Diabetic kidney evaluation - Urine ACR  04/11/2024   FOOT EXAM  04/11/2024   Health Maintenance Items Addressed: UP TO DATE ON BDS, MAMMOGRAM; WILL AGE OUT OF COLONOSCOPY; UP TO DATE SHOTS EXCEPT FOR PNA; WANTS NO MORE COVIDS  Additional Screening:  Vision Screening:  Recommended annual ophthalmology exams for early detection of glaucoma and other disorders of the eye.  Dental Screening: Recommended annual dental exams for proper oral hygiene  Community Resource Referral / Chronic Care Management: CRR required this visit?  No   CCM required this visit?  No     Plan:     I have personally reviewed and noted the following in the patient's chart:   Medical and social history Use of alcohol, tobacco or illicit drugs  Current medications and supplements including opioid prescriptions. Patient is not currently taking opioid prescriptions. Functional ability and status Nutritional status Physical activity Advanced directives List of other physicians Hospitalizations, surgeries, and ER visits in previous 12 months Vitals Screenings to include cognitive, depression, and falls Referrals and appointments  In addition, I have reviewed and discussed with patient certain preventive protocols, quality metrics, and best practice recommendations. A written personalized care plan for preventive services as well as general preventive  health recommendations were provided to patient.     Pinky Bright, LPN   [id number]   After Visit Summary: (MyChart) Due to this being a telephonic visit, the after visit summary with patients personalized plan was offered to patient via MyChart   Notes:  DM LABS ORDERED

## 2024-04-12 NOTE — Patient Instructions (Addendum)
 Morgan Peterson , Thank you for taking time to come for your Medicare Wellness Visit. I appreciate your ongoing commitment to your health goals. Please review the following plan we discussed and let me know if I can assist you in the future.   Referrals/Orders/Follow-Ups/Clinician Recommendations: NONE  This is a list of the screening recommended for you and due dates:  Health Maintenance  Topic Date Due   COVID-19 Vaccine (4 - 2024-25 season) 08/21/2023   Yearly kidney health urinalysis for diabetes  04/11/2024   Complete foot exam   04/11/2024   Zoster (Shingles) Vaccine (1 of 2) 04/25/2024*   Eye exam for diabetics  05/24/2024   Flu Shot  07/20/2024   Hemoglobin A1C  08/25/2024   Mammogram  03/22/2025   Yearly kidney function blood test for diabetes  03/29/2025   Medicare Annual Wellness Visit  04/12/2025   DEXA scan (bone density measurement)  03/22/2029   DTaP/Tdap/Td vaccine (3 - Td or Tdap) 01/20/2031   Colon Cancer Screening  04/21/2032   Pneumonia Vaccine  Completed   Hepatitis C Screening  Completed   HPV Vaccine  Aged Out   Meningitis B Vaccine  Aged Out  *Topic was postponed. The date shown is not the original due date.    Advanced directives: (ACP Link)Information on Advanced Care Planning can be found at [place]  Secretary of [facility] Advance Health Care Directives Advance Health Care Directives. [email or web address]   Next Medicare Annual Wellness Visit scheduled for next year: Yes   04/25/25 @ 11:30 AM BY PHONE

## 2024-04-13 ENCOUNTER — Other Ambulatory Visit: Payer: Self-pay

## 2024-04-23 ENCOUNTER — Encounter: Payer: Self-pay | Admitting: Family Medicine

## 2024-04-24 ENCOUNTER — Ambulatory Visit (INDEPENDENT_AMBULATORY_CARE_PROVIDER_SITE_OTHER): Payer: Self-pay | Admitting: Family Medicine

## 2024-04-24 ENCOUNTER — Encounter: Payer: Self-pay | Admitting: Family Medicine

## 2024-04-24 VITALS — BP 112/68 | HR 99 | Resp 16 | Ht 66.0 in | Wt 165.3 lb

## 2024-04-24 DIAGNOSIS — R944 Abnormal results of kidney function studies: Secondary | ICD-10-CM | POA: Diagnosis not present

## 2024-04-24 DIAGNOSIS — I10 Essential (primary) hypertension: Secondary | ICD-10-CM | POA: Diagnosis not present

## 2024-04-24 NOTE — Progress Notes (Signed)
 Name: Morgan Peterson   MRN: [id number]    DOB: [birth year]   Date:04/24/2024       Progress Note  Subjective  Chief Complaint  Chief Complaint  Patient presents with   Medical Management of Chronic Issues   Discussed the use of AI scribe software for clinical note transcription with the patient, who gave verbal consent to proceed.  History of Present Illness Morgan Peterson is a 73 year old female who presents for evaluation of low GFR.  Her GFR was recently measured at 55, which is below the normal range. This is the first instance of an abnormal GFR, as previous readings have been normal. She is currently under surveillance with her oncologist, who is aware of the low GFR.  Her blood pressure has been low, with recent readings of 112/68 and 110/65. She does not experience dizziness or sluggishness upon standing. Her medication regimen includes atenolol , recently added by Dr. Beau Bound and currently taking half of the 25 mg dose to control tachycardia  and spironolactone  25 mg daily  She has a history of breast  cancer and is not currently on any cancer-related medications. During her cancer treatment, spironolactone  was added due to hypokalemia and is wondering if she needs to continue taking it     Patient Active Problem List   Diagnosis Date Noted   Arrhythmia 03/13/2024   Gastroesophageal reflux disease without esophagitis 03/13/2024   Chemotherapy-induced neuropathy (HCC) 08/27/2022   Bilateral lower extremity edema 08/27/2022   Hives 08/27/2022   Food allergy  08/05/2022   Atherosclerosis of both carotid arteries 08/05/2022   Migraine aura without headache 08/05/2022   Carcinoma of upper-outer quadrant of right breast in female, estrogen receptor negative (HCC) 05/26/2022   Breast cancer (HCC) 05/10/2022   Goals of care, counseling/discussion 05/10/2022   Encounter for monitoring cardiotoxic drug therapy 05/10/2022   Carotid stenosis 04/21/2022   Hypertension associated with  type 2 diabetes mellitus (HCC) 01/20/2021   Intermittent low back pain 08/18/2017   Irritable bowel syndrome with constipation 05/02/2017   Vitamin D  deficiency 09/09/2016   Hiatal hernia 06/02/2016   Allergic rhinitis, seasonal 06/02/2016   Angio-edema 04/30/2016   Cervical radiculopathy at C6 07/31/2015   Neuroma digital nerve 07/31/2015   Dyslipidemia associated with type 2 diabetes mellitus (HCC) 06/30/2015   Cervical disc disease 06/30/2015   Essential hypertension 06/30/2015   History of breast cancer in female 03/23/2009    Past Surgical History:  Procedure Laterality Date   ABDOMINAL EXPLORATION SURGERY  2000   ovarian cyst   BREAST BIOPSY Right 2010   +    BREAST BIOPSY Left 04/06/2023   Left Breast Stereo Bx X Clip - path pending   BREAST BIOPSY Left 04/06/2023   Left Breast Stereo Bx, Coil Clip - path pending   BREAST BIOPSY Left 04/06/2023   MM LT BREAST BX W LOC DEV 1ST LESION IMAGE BX SPEC STEREO GUIDE 04/06/2023 [facility]   BREAST BIOPSY Left 04/06/2023   MM LT BREAST BX W LOC DEV EA AD LESION IMG BX SPEC STEREO GUIDE 04/06/2023 [facility]   BREAST EXCISIONAL BIOPSY Right 2010   + mammo site inasive mammo ca   BREAST LUMPECTOMY Right 2010   [facility]   COLONOSCOPY  07/19/2012   Normal exam, Dr. Marquita Situ   COLONOSCOPY WITH PROPOFOL  N/A 04/21/2022   Procedure: COLONOSCOPY WITH PROPOFOL ;  Surgeon: Marshall Skeeter, MD;  Location[facility] END[facility];  Service: Endoscopy;  Laterality: N/A;   ESOPHAGOGASTRODUODENOSCOPY (EGD) WITH PROPOFOL  N/A  10/22/2022   Procedure: ESOPHAGOGASTRODUODENOSCOPY (EGD) WITH PROPOFOL ;  Surgeon: Marnee Sink, MD;  [facility];  Service: Endoscopy;  Laterality: N/A;  Diabetic   MASTECTOMY Right 2010   partial/ lumpectomy   PORTACATH PLACEMENT Left 05/19/2022   Procedure: INSERTION PORT-A-CATH;  Surgeon: Marshall Skeeter, MD;  Location: [facility];  Service: General;  Laterality: Left;   SIMPLE MASTECTOMY WITH AXILLARY  SENTINEL NODE BIOPSY Right 04/26/2022   Procedure: SIMPLE MASTECTOMY WITH AXILLARY SENTINEL NODE BIOPSY;  Surgeon: Marshall Skeeter, MD;  Location: [facility];  Service: General;  Laterality: Right;  RNFA to assist   TUBAL LIGATION  20 years ago    Family History  Problem Relation Age of Onset   Osteoporosis Mother    Diabetes Father    Kidney disease Father    Diabetes Brother    Breast cancer Maternal Aunt    Bladder Cancer Maternal Aunt    Cervical cancer Maternal Aunt    Colon cancer Maternal Uncle    Lung cancer Maternal Uncle     Social History   Tobacco Use   Smoking status: Former    Current packs/day: 0.00    Average packs/day: 0.3 packs/day for 10.0 years (2.5 ttl pk-yrs)    Types: Cigarettes    Start date: 71    Quit date: 1988    Years since quitting: 37.3   Smokeless tobacco: Never   Tobacco comments:    smoking cessation materials not required  Substance Use Topics   Alcohol use: Yes    Comment: wine occ     Current Outpatient Medications:    aspirin  EC 81 MG tablet, Take 1 tablet (81 mg total) by mouth daily., Disp: 30 tablet, Rfl: 0   atenolol  (TENORMIN ) 25 MG tablet, Take 12.5 mg by mouth daily., Disp: , Rfl:    Azelastine  HCl 137 MCG/SPRAY SOLN, PLACE 1 SPRAY INTO BOTH NOSTRILS 2 (TWO) TIMES DAILY. USE IN EACH NOSTRIL AS DIRECTED, Disp: 30 mL, Rfl: 1   betamethasone dipropionate 0.05 % lotion, Apply 1 Application topically daily., Disp: , Rfl:    betamethasone, augmented, (DIPROLENE) 0.05 % lotion, Apply 1 application  topically 2 (two) times daily., Disp: , Rfl:    Blood Glucose Monitoring Suppl (ONETOUCH VERIO) w/Device KIT, 1 Device by Does not apply route once., Disp: 1 kit, Rfl: 0   Cyanocobalamin (VITAMIN B-12 PO), Take 1 tablet by mouth daily., Disp: , Rfl:    EPINEPHrine  (EPIPEN  2-PAK) 0.3 mg/0.3 mL IJ SOAJ injection, Inject 0.3 mg into the muscle as needed for anaphylaxis., Disp: 2 each, Rfl: 0   famotidine  (PEPCID ) 20 MG tablet, Take 1  tablet (20 mg total) by mouth at bedtime., Disp: 90 tablet, Rfl: 1   glucose blood (ONETOUCH VERIO) test strip, Use as instructed, Disp: 100 each, Rfl: 12   ketoconazole (NIZORAL) 2 % shampoo, Apply 1 Application topically 3 (three) times a week., Disp: , Rfl:    Lancets (ONETOUCH ULTRASOFT) lancets, Use as instructed, Disp: 100 each, Rfl: 3   levocetirizine (XYZAL ) 5 MG tablet, Take 1 tablet (5 mg total) by mouth daily., Disp: 90 tablet, Rfl: 1   minoxidil (LONITEN) 2.5 MG tablet, Take 2.5 mg by mouth daily., Disp: , Rfl:    montelukast  (SINGULAIR ) 10 MG tablet, TAKE 1 TABLET BY MOUTH EVERYDAY AT BEDTIME, Disp: 90 tablet, Rfl: 1   Multiple Vitamin (MULTIVITAMIN WITH MINERALS) TABS tablet, Take 1 tablet by mouth 2 (two) times a week., Disp: , Rfl:  omeprazole  (PRILOSEC) 40 MG capsule, Take 1 capsule (40 mg total) by mouth daily., Disp: 90 capsule, Rfl: 1   OZEMPIC, 0.25 OR 0.5 MG/DOSE, 2 MG/1.5ML SOPN, Inject 0.5 mg into the skin every Sunday., Disp: , Rfl:    polyethylene glycol (MIRALAX  / GLYCOLAX ) 17 g packet, Take 17 g by mouth daily., Disp: , Rfl:    prednisoLONE acetate (PRED FORTE) 1 % ophthalmic suspension, Place 1 drop into the left eye., Disp: , Rfl:    rosuvastatin  (CRESTOR ) 20 MG tablet, Take 1 tablet (20 mg total) by mouth every morning., Disp: 90 tablet, Rfl: 1   spironolactone  (ALDACTONE ) 25 MG tablet, Take 1 tablet (25 mg total) by mouth daily., Disp: 90 tablet, Rfl: 1   Vitamin D , Ergocalciferol , (DRISDOL ) 1.25 MG (50000 UNIT) CAPS capsule, Take 50,000 Units by mouth once a week., Disp: , Rfl:  No current facility-administered medications for this visit.  Facility-Administered Medications Ordered in Other Visits:    heparin  lock flush 100 UNIT/ML injection, , , ,    heparin  lock flush 100 unit/mL, 500 Units, Intravenous, Once, Brahmanday, Govinda R, MD  Allergies  Allergen Reactions   Levaquin [Levofloxacin In D5w] Swelling    Other reaction(s): Arthralgia (Joint Pain)    Losartan     Other reaction(s): Angioedema   Metoprolol Swelling    Lip and tongue tingling and numbness   Saxagliptin Other (See Comments)    Increased PVC's  Other reaction(s): irregular heart rate   Amlodipine-Olmesartan    Canagliflozin Other (See Comments)    Bladder pain   Egg White [Egg White (Egg Protein)]     Unknown reaction   Gabapentin      cns side effects.   Glipizide     Other reaction(s): Abdominal pain   Gluten Meal    Lisinopril Swelling    Angioedema   Metformin  And Related Nausea Only   Milk (Cow)    Milk-Related Compounds    Shrimp Extract     Other reaction(s): Other (See Comments)   Tramadol Nausea And Vomiting   Zyprexa  [Olanzapine ]    Actos [Pioglitazone] Other (See Comments) and Nausea And Vomiting    Bladder pain   Atorvastatin  Other (See Comments)    Other reaction(s): Abdominal Pain, Other (See Comments)   Carvedilol  Other (See Comments)    NUMBNESS, TINGLING, ACHING IN ARMS   Prednisone  Palpitations    I personally reviewed active problem list, medication list, allergies, family history with the patient/caregiver today.   ROS  Ten systems reviewed and is negative except as mentioned in HPI    Objective Physical Exam VITALS: P- 99, BP- 112/68  Constitutional: Patient appears well-developed and well-nourished.  No distress.  HEENT: head atraumatic, normocephalic, pupils equal and reactive to light, neck supple Cardiovascular: Normal rate, regular rhythm and normal heart sounds.  No murmur heard. No BLE edema. Pulmonary/Chest: Effort normal and breath sounds normal. No respiratory distress. Abdominal: Soft.  There is no tenderness. Psychiatric: Patient has a normal mood and affect. behavior is normal. Judgment and thought content normal.   Vitals:   04/24/24 1106  BP: 112/68  Pulse: 99  Resp: 16  SpO2: 99%  Weight: 165 lb 4.8 oz (75 kg)  Height: 5\' 6"  (1.676 m)    Body mass index is 26.68 kg/m.  Recent Results (from the past  2160 hours)  Hemoglobin A1c     Status: None   Collection Time: 02/23/24 12:00 AM  Result Value Ref Range   Hemoglobin A1C 6.6  Thyroid  Panel With TSH     Status: None   Collection Time: 03/29/24  9:39 AM  Result Value Ref Range   TSH 1.350 0.450 - 4.500 uIU/mL   T4, Total 6.7 4.5 - 12.0 ug/dL   T3 Uptake Ratio 26 24 - 39 %   Free Thyroxine Index 1.7 1.2 - 4.9    Comment: (NOTE) Performed At: [facility] [address] [place], [place] [id number] Pearlean Botts MD VH:[id number]   CMP (Cancer Center only)     Status: Abnormal   Collection Time: 03/29/24  9:39 AM  Result Value Ref Range   Sodium 136 135 - 145 mmol/L   Potassium 4.2 3.5 - 5.1 mmol/L   Chloride 104 98 - 111 mmol/L   CO2 27 22 - 32 mmol/L   Glucose, Bld 115 (H) 70 - 99 mg/dL    Comment: Glucose reference range applies only to samples taken after fasting for at least 8 hours.   BUN 11 8 - 23 mg/dL   Creatinine 4.13 (H) 2.44 - 1.00 mg/dL   Calcium  9.0 8.9 - 10.3 mg/dL   Total Protein 6.8 6.5 - 8.1 g/dL   Albumin 3.6 3.5 - 5.0 g/dL   AST 22 15 - 41 U/L   ALT 15 0 - 44 U/L   Alkaline Phosphatase 85 38 - 126 U/L   Total Bilirubin 0.6 0.0 - 1.2 mg/dL   GFR, Estimated 55 (L) >60 mL/min    Comment: (NOTE) Calculated using the CKD-EPI Creatinine Equation (2021)    Anion gap 5 5 - 15    Comment: Performed at [facility], [address] [place], [place] [address]  CBC with Differential (Cancer Center Only)     Status: Abnormal   Collection Time: 03/29/24  9:39 AM  Result Value Ref Range   WBC Count 3.4 (L) 4.0 - 10.5 K/uL   RBC 4.03 3.87 - 5.11 MIL/uL   Hemoglobin 12.0 12.0 - 15.0 g/dL   HCT 25.3 66.4 - 40.3 %   MCV 94.0 80.0 - 100.0 fL   MCH 29.8 26.0 - 34.0 pg   MCHC 31.7 30.0 - 36.0 g/dL   RDW 47.4 25.9 - 56.3 %   Platelet Count 201 150 - 400 K/uL   nRBC 0.0 0.0 - 0.2 %   Neutrophils Relative % 68 %   Neutro Abs 2.3 1.7 - 7.7 K/uL   Lymphocytes Relative 23 %   Lymphs Abs 0.8 0.7 - 4.0  K/uL   Monocytes Relative 6 %   Monocytes Absolute 0.2 0.1 - 1.0 K/uL   Eosinophils Relative 2 %   Eosinophils Absolute 0.1 0.0 - 0.5 K/uL   Basophils Relative 1 %   Basophils Absolute 0.0 0.0 - 0.1 K/uL   Immature Granulocytes 0 %   Abs Immature Granulocytes 0.01 0.00 - 0.07 K/uL    Comment: Performed at [facility], [address] [place], [place] [address]    PHQ2/9:    04/24/2024   11:04 AM 04/12/2024   10:59 AM 01/27/2024   10:23 AM 10/11/2023    9:45 AM 04/12/2023    9:55 AM  Depression screen PHQ 2/9  Decreased Interest 0 0 0 0 0  Down, Depressed, Hopeless 0 0 0 0 0  PHQ - 2 Score 0 0 0 0 0  Altered sleeping  0 0 0 0  Tired, decreased energy  0 0 0 0  Change in appetite  0 0 0 0  Feeling bad or  failure about yourself   0 0 0 0  Trouble concentrating  0 0 0 0  Moving slowly or fidgety/restless  0 0 0 0  Suicidal thoughts  0 0 0 0  PHQ-9 Score  0 0 0 0  Difficult doing work/chores  Not difficult at all Not difficult at all      phq 9 is negative  Fall Risk:    04/12/2024   11:01 AM 01/27/2024   10:22 AM 10/11/2023    9:45 AM 04/12/2023    9:55 AM 04/07/2023   10:40 AM  Fall Risk   Falls in the past year? 0 0 0 0 0  Number falls in past yr: 0 0 0 0 0  Injury with Fall? 0 0 0 0 0  Risk for fall due to : No Fall Risks No Fall Risks No Fall Risks No Fall Risks No Fall Risks  Follow up Falls prevention discussed;Falls evaluation completed Falls prevention discussed;Education provided;Falls evaluation completed Falls prevention discussed Falls prevention discussed Education provided;Falls prevention discussed     Assessment & Plan Hypertension Blood pressure low at 112/68 mmHg, heart rate elevated at 99 bpm. . Target blood pressure around 120 mmHg to prevent dizziness and falls. Discussed gradual reduction of spironolactone . - Reduce spironolactone  to 12.5 mg. - Continue atenolol  at half the current dose. - Monitor blood pressure regularly. - Adjust medications  based on blood pressure readings. - Gradually transition to atenolol  monotherapy if blood pressure remains stable. - Report blood pressure and heart rate readings for medication adjustments.  Low GFR GFR is 55, first abnormal reading; previous GFR was 78. Not diagnosed with chronic kidney disease. Advised to avoid NSAIDs and increase water  intake. - Recheck GFR at next oncologist visit. - Encourage increased water  intake. - Avoid NSAIDs.  Cancer surveillance Under surveillance with oncologist, Dr. B. No active cancer treatment or medication currently. - Continue regular follow-up with oncologist for cancer surveillance.

## 2024-05-02 ENCOUNTER — Other Ambulatory Visit: Payer: Self-pay | Admitting: Internal Medicine

## 2024-05-02 DIAGNOSIS — J302 Other seasonal allergic rhinitis: Secondary | ICD-10-CM

## 2024-05-03 NOTE — Telephone Encounter (Signed)
 Requested Prescriptions  Pending Prescriptions Disp Refills   Azelastine  HCl 137 MCG/SPRAY SOLN [Pharmacy Med Name: AZELASTINE  0.1% (137 MCG) SPRY] 30 mL 1    Sig: PLACE 1 SPRAY INTO BOTH NOSTRILS 2 (TWO) TIMES DAILY. USE IN EACH NOSTRIL AS DIRECTED     Ear, Nose, and Throat: Nasal Preparations - Antiallergy Passed - 05/03/2024  4:06 PM      Passed - Valid encounter within last 12 months    Recent Outpatient Visits           1 week ago Decreased calculated GFR   [facility] Sowles, [name], MD   1 month ago Dyslipidemia associated with type 2 diabetes mellitus [facility])   [facility] Arleen Lacer, MD   3 months ago Sore throat   [facility] Arleen Lacer, MD       Future Appointments             In 4 months Ava Lei, [name], MD [facility], [facility]

## 2024-05-29 ENCOUNTER — Other Ambulatory Visit: Payer: Self-pay | Admitting: Internal Medicine

## 2024-05-29 DIAGNOSIS — J302 Other seasonal allergic rhinitis: Secondary | ICD-10-CM

## 2024-05-30 NOTE — Telephone Encounter (Signed)
 Too soon for refill.  Requested Prescriptions  Pending Prescriptions Disp Refills   Azelastine  HCl 137 MCG/SPRAY SOLN [Pharmacy Med Name: AZELASTINE  0.1% (137 MCG) SPRY]  1    Sig: PLACE 1 SPRAY INTO BOTH NOSTRILS 2 (TWO) TIMES DAILY. USE IN EACH NOSTRIL AS DIRECTED     Ear, Nose, and Throat: Nasal Preparations - Antiallergy Passed - 05/30/2024  2:57 PM      Passed - Valid encounter within last 12 months    Recent Outpatient Visits           1 month ago Decreased calculated GFR   [facility] Arleen Lacer, MD   2 months ago Dyslipidemia associated with type 2 diabetes mellitus [facility])   [facility] Arleen Lacer, MD   4 months ago Sore throat   [facility] Arleen Lacer, MD       Future Appointments             In 3 months Ava Lei, [name], MD [facility], [facility]

## 2024-05-31 DIAGNOSIS — I890 Lymphedema, not elsewhere classified: Secondary | ICD-10-CM | POA: Diagnosis not present

## 2024-06-01 DIAGNOSIS — I890 Lymphedema, not elsewhere classified: Secondary | ICD-10-CM | POA: Diagnosis not present

## 2024-06-11 DIAGNOSIS — R350 Frequency of micturition: Secondary | ICD-10-CM | POA: Diagnosis not present

## 2024-06-11 DIAGNOSIS — E119 Type 2 diabetes mellitus without complications: Secondary | ICD-10-CM | POA: Diagnosis not present

## 2024-06-11 DIAGNOSIS — N3000 Acute cystitis without hematuria: Secondary | ICD-10-CM | POA: Diagnosis not present

## 2024-06-11 DIAGNOSIS — H1045 Other chronic allergic conjunctivitis: Secondary | ICD-10-CM | POA: Diagnosis not present

## 2024-06-11 DIAGNOSIS — H2513 Age-related nuclear cataract, bilateral: Secondary | ICD-10-CM | POA: Diagnosis not present

## 2024-06-11 DIAGNOSIS — H5213 Myopia, bilateral: Secondary | ICD-10-CM | POA: Diagnosis not present

## 2024-06-11 LAB — OPHTHALMOLOGY REPORT-SCANNED

## 2024-06-14 ENCOUNTER — Other Ambulatory Visit: Payer: Self-pay | Admitting: Family Medicine

## 2024-06-14 DIAGNOSIS — J302 Other seasonal allergic rhinitis: Secondary | ICD-10-CM

## 2024-07-02 DIAGNOSIS — L648 Other androgenic alopecia: Secondary | ICD-10-CM | POA: Diagnosis not present

## 2024-07-03 ENCOUNTER — Other Ambulatory Visit: Payer: Self-pay | Admitting: Internal Medicine

## 2024-07-03 DIAGNOSIS — J302 Other seasonal allergic rhinitis: Secondary | ICD-10-CM

## 2024-07-05 ENCOUNTER — Other Ambulatory Visit: Payer: Self-pay | Admitting: Family Medicine

## 2024-07-05 DIAGNOSIS — J302 Other seasonal allergic rhinitis: Secondary | ICD-10-CM

## 2024-07-05 DIAGNOSIS — L509 Urticaria, unspecified: Secondary | ICD-10-CM

## 2024-07-05 NOTE — Telephone Encounter (Signed)
 Requested Prescriptions  Pending Prescriptions Disp Refills   Azelastine  HCl 137 MCG/SPRAY SOLN [Pharmacy Med Name: AZELASTINE  0.1% (137 MCG) SPRY]  1    Sig: PLACE 1 SPRAY INTO BOTH NOSTRILS 2 (TWO) TIMES DAILY. USE IN EACH NOSTRIL AS DIRECTED     Ear, Nose, and Throat: Nasal Preparations - Antiallergy Passed - 07/05/2024 10:16 AM      Passed - Valid encounter within last 12 months    Recent Outpatient Visits           2 months ago Decreased calculated GFR   [facility] [name], MD   3 months ago Dyslipidemia associated with type 2 diabetes mellitus [facility])   [facility] [name], MD   5 months ago Sore throat   [facility] [name], MD       Future Appointments             In 2 months [name], [name], MD [facility], [facility]

## 2024-07-16 DIAGNOSIS — R001 Bradycardia, unspecified: Secondary | ICD-10-CM | POA: Diagnosis not present

## 2024-07-16 DIAGNOSIS — E66811 Obesity, class 1: Secondary | ICD-10-CM | POA: Diagnosis not present

## 2024-07-16 DIAGNOSIS — E782 Mixed hyperlipidemia: Secondary | ICD-10-CM | POA: Diagnosis not present

## 2024-07-16 DIAGNOSIS — Z9221 Personal history of antineoplastic chemotherapy: Secondary | ICD-10-CM | POA: Diagnosis not present

## 2024-07-16 DIAGNOSIS — E119 Type 2 diabetes mellitus without complications: Secondary | ICD-10-CM | POA: Diagnosis not present

## 2024-07-16 DIAGNOSIS — I10 Essential (primary) hypertension: Secondary | ICD-10-CM | POA: Diagnosis not present

## 2024-07-16 DIAGNOSIS — Z853 Personal history of malignant neoplasm of breast: Secondary | ICD-10-CM | POA: Diagnosis not present

## 2024-07-27 ENCOUNTER — Other Ambulatory Visit: Payer: Self-pay | Admitting: Family Medicine

## 2024-07-27 DIAGNOSIS — J302 Other seasonal allergic rhinitis: Secondary | ICD-10-CM

## 2024-07-31 NOTE — Progress Notes (Signed)
 Pharmacy Quality Measure Review  This patient is appearing on a report for being at risk of failing the adherence measure for diabetes medications this calendar year.   Medication: Ozempic Last fill date: 07/21/24 for 28 day supply  Insurance report was not up to date. No action needed at this time.   Mairead Schwarzkopf E. Marsh, PharmD Clinical Pharmacist [facility] [phone]

## 2024-08-24 ENCOUNTER — Other Ambulatory Visit: Payer: Self-pay | Admitting: Nurse Practitioner

## 2024-08-24 DIAGNOSIS — J302 Other seasonal allergic rhinitis: Secondary | ICD-10-CM

## 2024-08-24 NOTE — Telephone Encounter (Signed)
 Too soon for refill,LRF 07/27/24.  Requested Prescriptions  Pending Prescriptions Disp Refills   Azelastine  HCl 137 MCG/SPRAY SOLN [Pharmacy Med Name: AZELASTINE  0.1% (137 MCG) SPRY]  1    Sig: PLACE 1 SPRAY INTO BOTH NOSTRILS 2 (TWO) TIMES DAILY. USE IN EACH NOSTRIL AS DIRECTED     Ear, Nose, and Throat: Nasal Preparations - Antiallergy Passed - 08/24/2024  2:03 PM      Passed - Valid encounter within last 12 months    Recent Outpatient Visits           4 months ago Decreased calculated GFR   [facility] [name], MD   5 months ago Dyslipidemia associated with type 2 diabetes mellitus [facility])   [facility] [name], MD   7 months ago Sore throat   [facility] [name], MD       Future Appointments             In 2 weeks Sowles, [name], MD [facility], [place]

## 2024-08-25 ENCOUNTER — Other Ambulatory Visit: Payer: Self-pay | Admitting: Family Medicine

## 2024-08-25 DIAGNOSIS — I10 Essential (primary) hypertension: Secondary | ICD-10-CM

## 2024-08-30 DIAGNOSIS — E1159 Type 2 diabetes mellitus with other circulatory complications: Secondary | ICD-10-CM | POA: Diagnosis not present

## 2024-08-30 DIAGNOSIS — E785 Hyperlipidemia, unspecified: Secondary | ICD-10-CM | POA: Diagnosis not present

## 2024-08-30 DIAGNOSIS — E1169 Type 2 diabetes mellitus with other specified complication: Secondary | ICD-10-CM | POA: Diagnosis not present

## 2024-08-30 DIAGNOSIS — E559 Vitamin D deficiency, unspecified: Secondary | ICD-10-CM | POA: Diagnosis not present

## 2024-08-30 DIAGNOSIS — E119 Type 2 diabetes mellitus without complications: Secondary | ICD-10-CM | POA: Diagnosis not present

## 2024-08-30 DIAGNOSIS — Z794 Long term (current) use of insulin: Secondary | ICD-10-CM | POA: Diagnosis not present

## 2024-08-30 DIAGNOSIS — I152 Hypertension secondary to endocrine disorders: Secondary | ICD-10-CM | POA: Diagnosis not present

## 2024-08-30 LAB — HEMOGLOBIN A1C: Hemoglobin A1C: 6.3

## 2024-09-13 ENCOUNTER — Encounter: Payer: Self-pay | Admitting: Family Medicine

## 2024-09-13 ENCOUNTER — Ambulatory Visit: Admitting: Family Medicine

## 2024-09-13 VITALS — BP 124/76 | HR 98 | Temp 98.0°F | Resp 16 | Ht 66.0 in | Wt 175.4 lb

## 2024-09-13 DIAGNOSIS — E785 Hyperlipidemia, unspecified: Secondary | ICD-10-CM

## 2024-09-13 DIAGNOSIS — I152 Hypertension secondary to endocrine disorders: Secondary | ICD-10-CM

## 2024-09-13 DIAGNOSIS — E1159 Type 2 diabetes mellitus with other circulatory complications: Secondary | ICD-10-CM | POA: Diagnosis not present

## 2024-09-13 DIAGNOSIS — J3089 Other allergic rhinitis: Secondary | ICD-10-CM

## 2024-09-13 DIAGNOSIS — E1169 Type 2 diabetes mellitus with other specified complication: Secondary | ICD-10-CM | POA: Diagnosis not present

## 2024-09-13 DIAGNOSIS — J302 Other seasonal allergic rhinitis: Secondary | ICD-10-CM | POA: Insufficient documentation

## 2024-09-13 DIAGNOSIS — M8580 Other specified disorders of bone density and structure, unspecified site: Secondary | ICD-10-CM | POA: Diagnosis not present

## 2024-09-13 DIAGNOSIS — Z853 Personal history of malignant neoplasm of breast: Secondary | ICD-10-CM

## 2024-09-13 DIAGNOSIS — K581 Irritable bowel syndrome with constipation: Secondary | ICD-10-CM | POA: Diagnosis not present

## 2024-09-13 DIAGNOSIS — G620 Drug-induced polyneuropathy: Secondary | ICD-10-CM | POA: Diagnosis not present

## 2024-09-13 DIAGNOSIS — K219 Gastro-esophageal reflux disease without esophagitis: Secondary | ICD-10-CM | POA: Diagnosis not present

## 2024-09-13 DIAGNOSIS — Z23 Encounter for immunization: Secondary | ICD-10-CM

## 2024-09-13 DIAGNOSIS — L509 Urticaria, unspecified: Secondary | ICD-10-CM | POA: Diagnosis not present

## 2024-09-13 MED ORDER — MONTELUKAST SODIUM 10 MG PO TABS: 10.0000 mg | ORAL_TABLET | Freq: Every day | ORAL | 1 refills | Status: AC

## 2024-09-13 MED ORDER — OMEPRAZOLE 20 MG PO CPDR: 20.0000 mg | DELAYED_RELEASE_CAPSULE | Freq: Every day | ORAL | 1 refills | Status: AC

## 2024-09-13 MED ORDER — SPIRONOLACTONE 25 MG PO TABS: 25.0000 mg | ORAL_TABLET | Freq: Every day | ORAL | 1 refills | Status: DC

## 2024-09-13 MED ORDER — ROSUVASTATIN CALCIUM 20 MG PO TABS: 20.0000 mg | ORAL_TABLET | ORAL | 1 refills | Status: AC

## 2024-09-13 NOTE — Progress Notes (Signed)
 Name: Morgan Peterson   MRN: [id number]    DOB: 04/13/1951   Date:09/13/2024       Progress Note  Subjective  Chief Complaint  Chief Complaint  Patient presents with   Medical Management of Chronic Issues   Discussed the use of AI scribe software for clinical note transcription with the patient, who gave verbal consent to proceed.  History of Present Illness Morgan Peterson is a 73 year old female who presents for a routine follow-up visit.  She has been under the care of multiple specialists, including a cardiologist, hematologist, oncologist, and endocrinologist. Recent lab work from September showed an LDL cholesterol of 76 mg/dL, blood sugar of 876 mg/dL, and an J8r of 3.6%. Kidney and liver functions were normal. Current medications include Ozempic 0.5 mg for diabetes, rosuvastatin  20 mg for cholesterol, and spironolactone  25 mg plus Atenolol  for blood pressure.  She has a history of right breast cancer, treated with lumpectomy, chemotherapy, and immunotherapy, completed in July of 2024  She undergoes regular mammograms. Post-chemotherapy, she developed neuropathy with tingling, numbness, and pain in her feet, which is improving with exercises from occupational therapy and a survivor group. She uses a foot exercise device under her desk and reports gradual improvement in sensation.  She has IBS with constipation, managed with Miralax , and reports no recent issues with urgency or cramping.  She experiences chronic allergic rhinitis, exacerbated by seasonal changes, and uses Astelin  nasal spray and Singulair . She has enough nasal spray but requires a refill for Singulair .  She has a history of migraines but has not experienced them in years. She also has GERD without esophagitis, for which she takes omeprazole  daily and Pepcid  as needed.  She has vitamin D  deficiency, managed with a prescription from her dermatologist, and uses minoxidil for hair regrowth post-chemotherapy. She also  takes spironolactone , which aids in blood pressure control and hair health.  No recent issues with hives since a previous episode related to food. Her osteopenia is managed with a high calcium  diet and vitamin D  supplementation. Her last bone density scan in 2025 showed a low risk of fracture.    Patient Active Problem List   Diagnosis Date Noted   Osteopenia after menopause 09/13/2024   Perennial allergic rhinitis with seasonal variation 09/13/2024   Arrhythmia 03/13/2024   Gastroesophageal reflux disease without esophagitis 03/13/2024   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   Bilateral lower extremity edema 08/27/2022   Hives 08/27/2022   Food allergy  08/05/2022   Atherosclerosis of both carotid arteries 08/05/2022   Migraine aura without headache 08/05/2022   Carcinoma of upper-outer quadrant of right breast in female, estrogen receptor negative (HCC) 05/26/2022   Breast cancer (HCC) 05/10/2022   Goals of care, counseling/discussion 05/10/2022   Encounter for monitoring cardiotoxic drug therapy 05/10/2022   Carotid stenosis 04/21/2022   Hypertension associated with type 2 diabetes mellitus (HCC) 01/20/2021   Intermittent low back pain 08/18/2017   Irritable bowel syndrome with constipation 05/02/2017   Vitamin D  deficiency 09/09/2016   Hiatal hernia 06/02/2016   Allergic rhinitis, seasonal 06/02/2016   Angio-edema 04/30/2016   Cervical radiculopathy at C6 07/31/2015   Neuroma digital nerve 07/31/2015   Dyslipidemia associated with type 2 diabetes mellitus (HCC) 06/30/2015   Cervical disc disease 06/30/2015   Essential hypertension 06/30/2015   History of right breast cancer 03/23/2009    Past Surgical History:  Procedure Laterality Date   ABDOMINAL EXPLORATION SURGERY  2000   ovarian cyst   BREAST BIOPSY Right  2010   +    BREAST BIOPSY Left 04/06/2023   Left Breast Stereo Bx X Clip - path pending   BREAST BIOPSY Left 04/06/2023   Left Breast Stereo Bx, Coil Clip  - path pending   BREAST BIOPSY Left 04/06/2023   MM LT BREAST BX W LOC DEV 1ST LESION IMAGE BX SPEC STEREO GUIDE 04/06/2023 [facility]   BREAST BIOPSY Left 04/06/2023   MM LT BREAST BX W LOC DEV EA AD LESION IMG BX SPEC STEREO GUIDE 04/06/2023 [facility]   BREAST EXCISIONAL BIOPSY Right 2010   + mammo site inasive mammo ca   BREAST LUMPECTOMY Right 2010   [facility]   COLONOSCOPY  07/19/2012   Normal exam, Dr. Dessa   COLONOSCOPY WITH PROPOFOL  N/A 04/21/2022   Procedure: COLONOSCOPY WITH PROPOFOL ;  Surgeon: Dessa Reyes ORN, MD;  Location: [facility];  Service: Endoscopy;  Laterality: N/A;   ESOPHAGOGASTRODUODENOSCOPY (EGD) WITH PROPOFOL  N/A 10/22/2022   Procedure: ESOPHAGOGASTRODUODENOSCOPY (EGD) WITH PROPOFOL ;  Surgeon: Jinny Carmine, MD;  Location: [facility];  Service: Endoscopy;  Laterality: N/A;  Diabetic   MASTECTOMY Right 2010   partial/ lumpectomy   PORTACATH PLACEMENT Left 05/19/2022   Procedure: INSERTION PORT-A-CATH;  Surgeon: Dessa Reyes ORN, MD;  Location: [facility];  Service: General;  Laterality: Left;   SIMPLE MASTECTOMY WITH AXILLARY SENTINEL NODE BIOPSY Right 04/26/2022   Procedure: SIMPLE MASTECTOMY WITH AXILLARY SENTINEL NODE BIOPSY;  Surgeon: Dessa Reyes ORN, MD;  Location: [facility];  Service: General;  Laterality: Right;  RNFA to assist   TUBAL LIGATION  20 years ago    Family History  Problem Relation Age of Onset   Osteoporosis Mother    Diabetes Father    Kidney disease Father    Diabetes Brother    Breast cancer Maternal Aunt    Bladder Cancer Maternal Aunt    Cervical cancer Maternal Aunt    Colon cancer Maternal Uncle    Lung cancer Maternal Uncle     Social History   Tobacco Use   Smoking status: Former    Current packs/day: 0.00    Average packs/day: 0.3 packs/day for 10.0 years (2.5 ttl pk-yrs)    Types: Cigarettes    Start date: 59    Quit date: 1988    Years since quitting: 37.7   Smokeless tobacco: Never    Tobacco comments:    smoking cessation materials not required  Substance Use Topics   Alcohol use: Yes    Comment: wine occ     Current Outpatient Medications:    aspirin  EC 81 MG tablet, Take 1 tablet (81 mg total) by mouth daily., Disp: 30 tablet, Rfl: 0   atenolol  (TENORMIN ) 25 MG tablet, Take 12.5 mg by mouth daily., Disp: , Rfl:    Azelastine  HCl 137 MCG/SPRAY SOLN, PLACE 1 SPRAY INTO BOTH NOSTRILS 2 (TWO) TIMES DAILY. USE IN EACH NOSTRIL AS DIRECTED, Disp: 30 mL, Rfl: 1   betamethasone dipropionate 0.05 % lotion, Apply 1 Application topically daily., Disp: , Rfl:    betamethasone, augmented, (DIPROLENE) 0.05 % lotion, Apply 1 application  topically 2 (two) times daily., Disp: , Rfl:    Blood Glucose Monitoring Suppl (ONETOUCH VERIO) w/Device KIT, 1 Device by Does not apply route once., Disp: 1 kit, Rfl: 0   Cyanocobalamin (VITAMIN B-12 PO), Take 1 tablet by mouth daily., Disp: , Rfl:    EPINEPHrine  (EPIPEN  2-PAK) 0.3 mg/0.3 mL IJ SOAJ injection, Inject 0.3 mg into the muscle as needed for anaphylaxis., Disp:  2 each, Rfl: 0   famotidine  (PEPCID ) 20 MG tablet, Take 1 tablet (20 mg total) by mouth at bedtime., Disp: 90 tablet, Rfl: 1   glucose blood (ONETOUCH VERIO) test strip, Use as instructed, Disp: 100 each, Rfl: 12   ketoconazole (NIZORAL) 2 % shampoo, Apply 1 Application topically 3 (three) times a week., Disp: , Rfl:    Lancets (ONETOUCH ULTRASOFT) lancets, Use as instructed, Disp: 100 each, Rfl: 3   levocetirizine (XYZAL ) 5 MG tablet, TAKE 1 TABLET (5 MG TOTAL) BY MOUTH DAILY., Disp: 90 tablet, Rfl: 1   minoxidil (LONITEN) 2.5 MG tablet, Take 2.5 mg by mouth daily., Disp: , Rfl:    Multiple Vitamin (MULTIVITAMIN WITH MINERALS) TABS tablet, Take 1 tablet by mouth 2 (two) times a week., Disp: , Rfl:    OZEMPIC, 0.25 OR 0.5 MG/DOSE, 2 MG/1.5ML SOPN, Inject 0.5 mg into the skin every Sunday., Disp: , Rfl:    polyethylene glycol (MIRALAX  / GLYCOLAX ) 17 g packet, Take 17 g by mouth  daily., Disp: , Rfl:    prednisoLONE acetate (PRED FORTE) 1 % ophthalmic suspension, Place 1 drop into the left eye., Disp: , Rfl:    Vitamin D , Ergocalciferol , (DRISDOL ) 1.25 MG (50000 UNIT) CAPS capsule, Take 50,000 Units by mouth once a week., Disp: , Rfl:    montelukast  (SINGULAIR ) 10 MG tablet, Take 1 tablet (10 mg total) by mouth at bedtime., Disp: 90 tablet, Rfl: 1   omeprazole  (PRILOSEC) 20 MG capsule, Take 1 capsule (20 mg total) by mouth daily., Disp: 90 capsule, Rfl: 1   rosuvastatin  (CRESTOR ) 20 MG tablet, Take 1 tablet (20 mg total) by mouth every morning., Disp: 90 tablet, Rfl: 1   spironolactone  (ALDACTONE ) 25 MG tablet, Take 1 tablet (25 mg total) by mouth daily., Disp: 90 tablet, Rfl: 1 No current facility-administered medications for this visit.  Facility-Administered Medications Ordered in Other Visits:    heparin  lock flush 100 UNIT/ML injection, , , ,    heparin  lock flush 100 unit/mL, 500 Units, Intravenous, Once, Brahmanday, Govinda R, MD  Allergies  Allergen Reactions   Levaquin [Levofloxacin In D5w] Swelling    Other reaction(s): Arthralgia (Joint Pain)   Losartan     Other reaction(s): Angioedema   Metoprolol Swelling    Lip and tongue tingling and numbness   Saxagliptin Other (See Comments)    Increased PVC's  Other reaction(s): irregular heart rate   Amlodipine-Olmesartan    Canagliflozin Other (See Comments)    Bladder pain   Egg White [Egg White (Egg Protein)]     Unknown reaction   Gabapentin      cns side effects.   Glipizide     Other reaction(s): Abdominal pain   Gluten Meal    Lisinopril Swelling    Angioedema   Metformin  And Related Nausea Only   Milk (Cow)    Milk-Related Compounds    Shrimp Extract     Other reaction(s): Other (See Comments)   Tramadol Nausea And Vomiting   Zyprexa  [Olanzapine ]    Actos [Pioglitazone] Other (See Comments) and Nausea And Vomiting    Bladder pain   Atorvastatin  Other (See Comments)    Other  reaction(s): Abdominal Pain, Other (See Comments)   Carvedilol  Other (See Comments)    NUMBNESS, TINGLING, ACHING IN ARMS   Prednisone  Palpitations    I personally reviewed active problem list, medication list, allergies, family history with the patient/caregiver today.   ROS  Ten systems reviewed and is negative except as mentioned in  HPI    Objective Physical Exam MEASUREMENTS: Weight- increased by 10 pounds, BMI- 28.31. CONSTITUTIONAL: Patient appears well-developed and well-nourished. No distress. HEENT: Head atraumatic, normocephalic, neck supple. CARDIOVASCULAR: Normal rate, regular rhythm and normal heart sounds. No murmur heard. No BLE edema. PULMONARY: Effort normal and breath sounds normal. No respiratory distress. ABDOMINAL: There is no tenderness or distention. MUSCULOSKELETAL: Normal gait. Without gross motor or sensory deficit. Hammertoe on second toe of left foot. PSYCHIATRIC: Patient has a normal mood and affect. Behavior is normal. Judgment and thought content normal. NEUROLOGICAL: Sensation intact in feet. No sores on feet.  Vitals:   09/13/24 1101  BP: 124/76  Pulse: 98  Resp: 16  Temp: 98 F (36.7 C)  TempSrc: Oral  SpO2: 99%  Weight: 175 lb 6.4 oz (79.6 kg)  Height: 5' 6 (1.676 m)    Body mass index is 28.31 kg/m.  Recent Results (from the past 2160 hours)  Hemoglobin A1c     Status: None   Collection Time: 08/30/24 12:00 AM  Result Value Ref Range   Hemoglobin A1C 6.3     Diabetic Foot Exam:  Diabetic foot exam was performed with the following findings:   Normal sensation of 10g monofilament Intact posterior tibialis and dorsalis pedis pulses Hammer toe 2nd left toe      PHQ2/9:    09/13/2024   11:01 AM 04/24/2024   11:04 AM 04/12/2024   10:59 AM 01/27/2024   10:23 AM 10/11/2023    9:45 AM  Depression screen PHQ 2/9  Decreased Interest 0 0 0 0 0  Down, Depressed, Hopeless 0 0 0 0 0  PHQ - 2 Score 0 0 0 0 0  Altered sleeping   0 0 0   Tired, decreased energy   0 0 0  Change in appetite   0 0 0  Feeling bad or failure about yourself    0 0 0  Trouble concentrating   0 0 0  Moving slowly or fidgety/restless   0 0 0  Suicidal thoughts   0 0 0  PHQ-9 Score   0 0 0  Difficult doing work/chores   Not difficult at all Not difficult at all     phq 9 is negative  Fall Risk:    09/13/2024   11:00 AM 04/12/2024   11:01 AM 01/27/2024   10:22 AM 10/11/2023    9:45 AM 04/12/2023    9:55 AM  Fall Risk   Falls in the past year? 0 0 0 0 0  Number falls in past yr: 0 0 0 0 0  Injury with Fall? 0 0 0 0 0  Risk for fall due to : No Fall Risks No Fall Risks No Fall Risks No Fall Risks No Fall Risks  Follow up Falls evaluation completed Falls prevention discussed;Falls evaluation completed Falls prevention discussed;Education provided;Falls evaluation completed Falls prevention discussed Falls prevention discussed      Assessment & Plan Type 2 diabetes mellitus associated with dyslipidemia and HTN  Diabetes well-controlled with A1c 6.3%. Weight gain poses risk to control. - Continue Ozempic 0.5 mg. - Encourage dietary modifications to prevent weight gain. - Perform urine microalbumin test.  Overweight BMI 28.31. Weight gain due to diet, risking diabetes control. - Encourage dietary modifications. - Promote healthy snacking and increased water  intake.  Hypertension Controlled with spironolactone  and atenolol . - Continue spironolactone  25 mg. - Continue atenolol  as prescribed.  Dyslipidemia LDL 76 mg/dL, close to target. Discussed medication adjustment. - Continue rosuvastatin   20 mg. - Patient to decide on dosage adjustment.  Gastroesophageal reflux disease (GERD) without esophagitis GERD managed with omeprazole  and Pepcid . Plan to reduce omeprazole  due to osteopenia. - Reduce omeprazole  to 20 mg daily. - Use Pepcid  as needed. - Monitor GERD symptoms.  Osteopenia after menopause Low fracture risk. Managed with diet  and supplements. - Continue high calcium  diet and vitamin D . - Encourage physical activity.  Vitamin D  deficiency Managed with weekly supplementation. - Continue weekly vitamin D  supplementation.  Personal history of right breast cancer, status post lumpectomy and chemotherapy Completed treatment. Regular follow-up required. - Continue regular mammogram screenings.  Chemotherapy-induced peripheral neuropathy Symptoms improving with therapy. - Continue exercises and occupational therapy.  Irritable bowel syndrome with constipation Symptoms controlled with Miralax . - Continue Miralax  as needed.  Allergic rhinitis Managed with Astelin  and Singulair . - Continue Singulair  as needed.  Hammertoe, left second toe No treatment required unless symptoms worsen. - Monitor for changes or symptoms.  Arthritis of fingers Joint thickening and soreness present. No acute intervention needed. - Monitor symptoms and manage pain as needed.  General Health Maintenance Flu vaccination recommended. - Administer flu vaccine.

## 2024-09-14 ENCOUNTER — Ambulatory Visit: Payer: Self-pay | Admitting: Family Medicine

## 2024-09-14 LAB — MICROALBUMIN / CREATININE URINE RATIO
Creatinine, Urine: 180 mg/dL (ref 20–275)
Microalb Creat Ratio: 3 mg/g{creat} (ref <30)
Microalb, Ur: 0.5 mg/dL

## 2024-09-18 ENCOUNTER — Inpatient Hospital Stay: Attending: Internal Medicine

## 2024-09-18 ENCOUNTER — Encounter: Payer: Self-pay | Admitting: Internal Medicine

## 2024-09-18 ENCOUNTER — Inpatient Hospital Stay: Admitting: Internal Medicine

## 2024-09-18 VITALS — BP 115/79 | HR 82 | Temp 97.8°F | Resp 16 | Ht 66.0 in | Wt 176.8 lb

## 2024-09-18 DIAGNOSIS — Z853 Personal history of malignant neoplasm of breast: Secondary | ICD-10-CM | POA: Insufficient documentation

## 2024-09-18 DIAGNOSIS — Z8049 Family history of malignant neoplasm of other genital organs: Secondary | ICD-10-CM | POA: Insufficient documentation

## 2024-09-18 DIAGNOSIS — Z79899 Other long term (current) drug therapy: Secondary | ICD-10-CM | POA: Insufficient documentation

## 2024-09-18 DIAGNOSIS — Z803 Family history of malignant neoplasm of breast: Secondary | ICD-10-CM | POA: Diagnosis not present

## 2024-09-18 DIAGNOSIS — Z87891 Personal history of nicotine dependence: Secondary | ICD-10-CM | POA: Diagnosis not present

## 2024-09-18 DIAGNOSIS — Z8052 Family history of malignant neoplasm of bladder: Secondary | ICD-10-CM | POA: Insufficient documentation

## 2024-09-18 DIAGNOSIS — Z7982 Long term (current) use of aspirin: Secondary | ICD-10-CM | POA: Diagnosis not present

## 2024-09-18 DIAGNOSIS — Z800 Family history of malignant neoplasm of digestive organs: Secondary | ICD-10-CM | POA: Diagnosis not present

## 2024-09-18 DIAGNOSIS — M85851 Other specified disorders of bone density and structure, right thigh: Secondary | ICD-10-CM | POA: Insufficient documentation

## 2024-09-18 DIAGNOSIS — Z801 Family history of malignant neoplasm of trachea, bronchus and lung: Secondary | ICD-10-CM | POA: Diagnosis not present

## 2024-09-18 DIAGNOSIS — Z9011 Acquired absence of right breast and nipple: Secondary | ICD-10-CM | POA: Insufficient documentation

## 2024-09-18 DIAGNOSIS — Z171 Estrogen receptor negative status [ER-]: Secondary | ICD-10-CM | POA: Diagnosis not present

## 2024-09-18 DIAGNOSIS — I10 Essential (primary) hypertension: Secondary | ICD-10-CM | POA: Diagnosis not present

## 2024-09-18 DIAGNOSIS — Z9221 Personal history of antineoplastic chemotherapy: Secondary | ICD-10-CM | POA: Insufficient documentation

## 2024-09-18 DIAGNOSIS — Z923 Personal history of irradiation: Secondary | ICD-10-CM | POA: Insufficient documentation

## 2024-09-18 DIAGNOSIS — E1142 Type 2 diabetes mellitus with diabetic polyneuropathy: Secondary | ICD-10-CM | POA: Insufficient documentation

## 2024-09-18 DIAGNOSIS — Z7985 Long-term (current) use of injectable non-insulin antidiabetic drugs: Secondary | ICD-10-CM | POA: Diagnosis not present

## 2024-09-18 DIAGNOSIS — C50411 Malignant neoplasm of upper-outer quadrant of right female breast: Secondary | ICD-10-CM

## 2024-09-18 LAB — CBC WITH DIFFERENTIAL (CANCER CENTER ONLY)
Abs Immature Granulocytes: 0.01 K/uL (ref 0.00–0.07)
Basophils Absolute: 0 K/uL (ref 0.0–0.1)
Basophils Relative: 1 %
Eosinophils Absolute: 0.1 K/uL (ref 0.0–0.5)
Eosinophils Relative: 2 %
HCT: 37.6 % (ref 36.0–46.0)
Hemoglobin: 12 g/dL (ref 12.0–15.0)
Immature Granulocytes: 0 %
Lymphocytes Relative: 29 %
Lymphs Abs: 1.3 K/uL (ref 0.7–4.0)
MCH: 29.3 pg (ref 26.0–34.0)
MCHC: 31.9 g/dL (ref 30.0–36.0)
MCV: 91.9 fL (ref 80.0–100.0)
Monocytes Absolute: 0.3 K/uL (ref 0.1–1.0)
Monocytes Relative: 7 %
Neutro Abs: 2.6 K/uL (ref 1.7–7.7)
Neutrophils Relative %: 61 %
Platelet Count: 207 K/uL (ref 150–400)
RBC: 4.09 MIL/uL (ref 3.87–5.11)
RDW: 12.7 % (ref 11.5–15.5)
WBC Count: 4.3 K/uL (ref 4.0–10.5)
nRBC: 0 % (ref 0.0–0.2)

## 2024-09-18 LAB — CMP (CANCER CENTER ONLY)
ALT: 11 U/L (ref 0–44)
AST: 17 U/L (ref 15–41)
Albumin: 3.6 g/dL (ref 3.5–5.0)
Alkaline Phosphatase: 85 U/L (ref 38–126)
Anion gap: 8 (ref 5–15)
BUN: 15 mg/dL (ref 8–23)
CO2: 24 mmol/L (ref 22–32)
Calcium: 9 mg/dL (ref 8.9–10.3)
Chloride: 105 mmol/L (ref 98–111)
Creatinine: 0.91 mg/dL (ref 0.44–1.00)
GFR, Estimated: >60 mL/min (ref >60)
Glucose, Bld: 100 mg/dL — ABNORMAL HIGH (ref 70–99)
Potassium: 4.2 mmol/L (ref 3.5–5.1)
Sodium: 137 mmol/L (ref 135–145)
Total Bilirubin: 0.8 mg/dL (ref 0.0–1.2)
Total Protein: 7 g/dL (ref 6.5–8.1)

## 2024-09-18 NOTE — Progress Notes (Signed)
 [facility] CONSULT NOTE  Patient Care Team: Sowles, [name], MD as PCP - General (Family Medicine) Dessa, Reyes ORN, MD (General Surgery) [name], MD as Referring Physician (Otolaryngology) Florencio Cara BIRCH, MD as Consulting Physician (Cardiology) Cathlyn Seal, MD as Referring Physician (Dermatology) Cherilyn Debby CROME, MD as Consulting Physician (Internal Medicine) Rennie Morgan SAUNDERS, MD as Consulting Physician (Internal Medicine) Tye Millet, DO as Consulting Physician (General Surgery) Pll[facility], [facility]  CHIEF COMPLAINTS/PURPOSE OF CONSULTATION: Breast cancer  #  Oncology History Overview Note  She has a remote history of right breast cancer, T1bN0, ER positive,s/p lumpectomy and MammoSite radiation and 5 years of Arimidex.    02/19/22 screening mammogram bilaterally showed  1. Suspicious right breast mass at the 10 o'clock position 8 cm from the nipple. Recommend ultrasound-guided biopsy. 2. Indeterminate right breast mass at the 6 o'clock position 4 cm from the nipple. Recommend ultrasound-guided biopsy. 3. No suspicious right axillary lymphadenopathy.   04/12/2022 Unilateral right breast diagnostic mammogram showed 1. Suspicious right breast mass at the 10 o'clock position 8 cm from the nipple. Recommend ultrasound-guided biopsy. 2. Indeterminate right breast mass at the 6 o'clock position 4 cm from the nipple. Recommend ultrasound-guided biopsy. 3. No suspicious right axillary lymphadenopathy.   04/13/2022 right breast mass 6 o'clock position 1 cm from the nipple showed invasive mammary carcinoma, no special type. Grade 3, DCIS present high grade, LVI not identified. ER-, PR 1-10%, HER 2 -   04/13/2022, right breast mass 10:00 7 cm from nipple biopsy showed invasive mammary carcinoma, grade 3, high-grade DCIS with focal comedonecrosis, lymphovascular invasion not identified.  ER -, PR 1 to 10%, HER2-    04/26/2022, right breast mastectomy  with axillary dissection Invasive mammary carcinoma, no special type, multifocal, DCIS high-grade, benign nipple/areola.  2 deposits of invasive mammary carcinoma, no definite residual lymph node identified.   Breast cancer (HCC)  05/10/2022 Initial Diagnosis   Breast cancer (HCC)   05/10/2022 Cancer Staging   Staging form: Breast, AJCC 8th Edition - Pathologic stage from 05/10/2022: Stage IIA (pT1c, pN1a, cM0, G3, ER-, PR+, HER2-) - Signed by Babara Call, MD on 05/10/2022 Stage prefix: Initial diagnosis Histologic grading system: 3 grade system   05/24/2022 - 05/24/2022 Chemotherapy   Patient is on Treatment Plan : BREAST Pembrolizumab  (200) D1 + Carboplatin  (5) D1 + Paclitaxel  (80) D1,8,15 q21d X 4 cycles / Pembrolizumab  (200) D1 + AC D1 q21d x 4 cycles     05/27/2022 - 08/13/2022 Chemotherapy   Patient is on Treatment Plan : BREAST Pembrolizumab  (200) D1 + Carboplatin  (5) D1 + Paclitaxel  (80) D1,8,15 q21d X 4 cycles / Pembrolizumab  (200) D1 + AC D1 q21d x 4 cycles     05/27/2022 - 12/07/2022 Chemotherapy   Patient is on Treatment Plan : BREAST Pembrolizumab  (200) D1 + Carboplatin  (5) D1 + Paclitaxel  (80) D1,8,15 q21d X 4 cycles / Pembrolizumab  (200) D1 + AC D1 q21d x 4 cycles     01/04/2023 - 06/21/2023 Chemotherapy   Patient is on Treatment Plan : BREAST Pembrolizumab  (200) q21d x 27 weeks     Carcinoma of upper-outer quadrant of right breast in female, estrogen receptor negative (HCC)  05/26/2022 Initial Diagnosis   Carcinoma of upper-outer quadrant of right breast in female, estrogen receptor negative (HCC)   05/26/2022 Cancer Staging   Staging form: Breast, AJCC 8th Edition - Pathologic: Stage IIA (pT1c, pN1, cM0, G3, ER-, PR-, HER2-) - Signed by Rennie Morgan SAUNDERS, MD on 05/26/2022  Multigene prognostic tests performed: None Histologic grading system: 3 grade system   05/27/2022 - 12/07/2022 Chemotherapy   Patient is on Treatment Plan : BREAST Pembrolizumab  (200) D1 + Carboplatin  (5) D1 +  Paclitaxel  (80) D1,8,15 q21d X 4 cycles / Pembrolizumab  (200) D1 + AC D1 q21d x 4 cycles     01/04/2023 - 06/21/2023 Chemotherapy   Patient is on Treatment Plan : BREAST Pembrolizumab  (200) q21d x 27 weeks      HISTORY OF PRESENTING ILLNESS: Ambulating independently.  Alone.   Morgan Peterson 73 y.o.  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   Patient Stereotactic-guided biopsy of the left breast x2- negative for malignancy. Mammogram- June 2025.    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 but much improved.  Appetite is better. Denies any nausea . No fever no chills.   Review of Systems  Constitutional:  Positive for malaise/fatigue. Negative for chills, diaphoresis, fever and weight loss.  HENT:  Negative for nosebleeds and sore throat.   Eyes:  Negative for double vision.  Respiratory:  Negative for cough, hemoptysis, sputum production, shortness of breath and wheezing.   Cardiovascular:  Negative for chest pain, palpitations, orthopnea and leg swelling.  Gastrointestinal:  Negative for abdominal pain, blood in stool, constipation, diarrhea, heartburn, melena, nausea and vomiting.  Genitourinary:  Negative for dysuria, frequency and urgency.  Musculoskeletal:  Negative for back pain and joint pain.  Skin: Negative.  Negative for itching and rash.  Neurological:  Positive for tingling. Negative for dizziness, focal weakness, weakness and headaches.  Endo/Heme/Allergies:  Does not bruise/bleed easily.  Psychiatric/Behavioral:  Negative for depression. The patient is not nervous/anxious and does not have insomnia.      MEDICAL HISTORY:  Past Medical History:  Diagnosis Date   Appetite loss    Asthma    Cervical radiculopathy    Chemotherapy-induced nausea    Hiatal hernia    Hypercholesterolemia    Hyperlipidemia     Hypertension    IBS (irritable bowel syndrome)    Malignant neoplasm of upper-outer quadrant of female breast (HCC) 04/11/2009   Right breast, invasive ductal carcinoma, 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 Arimidex therapy completed September 2015   Mild mitral valve prolapse    Neuropathy    feet, from chemo   Personal history of malignant neoplasm of breast 2010   Personal history of radiation therapy 2010   mammosite   T2DM (type 2 diabetes mellitus) (HCC) 2011    SURGICAL HISTORY: Past Surgical History:  Procedure Laterality Date   ABDOMINAL EXPLORATION SURGERY  2000   ovarian cyst   BREAST BIOPSY Right 2010   +    BREAST BIOPSY Left 04/06/2023   Left Breast Stereo Bx X Clip - path pending   BREAST BIOPSY Left 04/06/2023   Left Breast Stereo Bx, Coil Clip - path pending   BREAST BIOPSY Left 04/06/2023   MM LT BREAST BX W LOC DEV 1ST LESION IMAGE BX SPEC STEREO GUIDE 04/06/2023 [facility]   BREAST BIOPSY Left 04/06/2023   MM LT BREAST BX W LOC DEV EA AD LESION IMG BX SPEC STEREO GUIDE 04/06/2023 [facility]   BREAST EXCISIONAL BIOPSY Right 2010   + mammo site inasive mammo ca   BREAST LUMPECTOMY Right 2010   [facility]   COLONOSCOPY  07/19/2012   Normal  exam, Dr. Dessa   COLONOSCOPY WITH PROPOFOL  N/A 04/21/2022   Procedure: COLONOSCOPY WITH PROPOFOL ;  Surgeon: Dessa Reyes ORN, MD;  Location: [facility];  Service: Endoscopy;  Laterality: N/A;   ESOPHAGOGASTRODUODENOSCOPY (EGD) WITH PROPOFOL  N/A 10/22/2022   Procedure: ESOPHAGOGASTRODUODENOSCOPY (EGD) WITH PROPOFOL ;  Surgeon: Jinny Carmine, MD;  Location[facility];  Service: Endoscopy;  Laterality: N/A;  Diabetic   MASTECTOMY Right 2010   partial/ lumpectomy   PORTACATH PLACEMENT Left 05/19/2022   Procedure: INSERTION PORT-A-CATH;  Surgeon: Dessa Reyes ORN, MD;  Location: [facility] ORS;  Service: General;  Laterality: Left;   SIMPLE MASTECTOMY WITH  AXILLARY SENTINEL NODE BIOPSY Right 04/26/2022   Procedure: SIMPLE MASTECTOMY WITH AXILLARY SENTINEL NODE BIOPSY;  Surgeon: Dessa Reyes ORN, MD;  Location: [facility];  Service: General;  Laterality: Right;  RNFA to assist   TUBAL LIGATION  20 years ago    SOCIAL HISTORY: Social History   Socioeconomic History   Marital status: Married    Spouse name: Antonio   Number of children: 2   Years of education: Not on file   Highest education level: Not on file  Occupational History   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  Tobacco Use   Smoking status: Former    Current packs/day: 0.00    Average packs/day: 0.3 packs/day for 10.0 years (2.5 ttl pk-yrs)    Types: Cigarettes    Start date: 31    Quit date: 1988    Years since quitting: 37.7   Smokeless tobacco: Never   Tobacco comments:    smoking cessation materials not required  Vaping Use   Vaping status: Never Used  Substance and Sexual Activity   Alcohol use: Yes    Comment: wine occ   Drug use: No   Sexual activity: Not Currently    Comment: husband has ED  Other Topics Concern   Not on file  Social History Narrative   Oncology nurse on the floor retired.;  Husband retired from [employer].  No smoking.   Social Drivers of Corporate investment banker Strain: Low Risk  (04/12/2024)   Overall Financial Resource Strain (CARDIA)    Difficulty of Paying Living Expenses: Not hard at all  Food Insecurity: No Food Insecurity (04/12/2024)   Hunger Vital Sign    Worried About Running Out of Food in the Last Year: Never true    Ran Out of Food in the Last Year: Never true  Transportation Needs: No Transportation Needs (04/12/2024)   PRAPARE - Administrator, Civil Service (Medical): No    Lack of Transportation (Non-Medical): No  Physical Activity: Sufficiently Active (04/12/2024)   Exercise Vital Sign    Days of Exercise per Week: 3 days    Minutes of Exercise per Session: 60 min  Stress: No Stress Concern  Present (04/12/2024)   [employer] of Occupational Health - Occupational Stress Questionnaire    Feeling of Stress : Not at all  Social Connections: Moderately Isolated (04/12/2024)   Social Connection and Isolation Panel    Frequency of Communication with Friends and Family: More than three times a week    Frequency of Social Gatherings with Friends and Family: Twice a week    Attends Religious Services: Never    Database administrator or Organizations: No    Attends Banker Meetings: Never    Marital Status: Married  Catering manager Violence: Not At Risk (04/12/2024)   Humiliation, Afraid, Rape, and  Kick questionnaire    Fear of Current or Ex-Partner: No    Emotionally Abused: No    Physically Abused: No    Sexually Abused: No    FAMILY HISTORY: Family History  Problem Relation Age of Onset   Osteoporosis Mother    Diabetes Father    Kidney disease Father    Diabetes Brother    Breast cancer Maternal Aunt    Bladder Cancer Maternal Aunt    Cervical cancer Maternal Aunt    Colon cancer Maternal Uncle    Lung cancer Maternal Uncle     ALLERGIES:  is allergic to levaquin [levofloxacin in d5w], losartan, metoprolol, saxagliptin, amlodipine-olmesartan, canagliflozin, egg white [egg white (egg protein)], gabapentin , glipizide, gluten meal, lisinopril, metformin  and related, milk (cow), milk-related compounds, shrimp extract, tramadol, zyprexa  [olanzapine ], actos [pioglitazone], atorvastatin , carvedilol , and prednisone .  MEDICATIONS:  Current Outpatient Medications  Medication Sig Dispense Refill   aspirin  EC 81 MG tablet Take 1 tablet (81 mg total) by mouth daily. 30 tablet 0   atenolol  (TENORMIN ) 25 MG tablet Take 12.5 mg by mouth daily.     Azelastine  HCl 137 MCG/SPRAY SOLN PLACE 1 SPRAY INTO BOTH NOSTRILS 2 (TWO) TIMES DAILY. USE IN EACH NOSTRIL AS DIRECTED 30 mL 1   betamethasone dipropionate 0.05 % lotion Apply 1 Application topically daily.      betamethasone, augmented, (DIPROLENE) 0.05 % lotion Apply 1 application  topically 2 (two) times daily.     Blood Glucose Monitoring Suppl (ONETOUCH VERIO) w/Device KIT 1 Device by Does not apply route once. 1 kit 0   EPINEPHrine  (EPIPEN  2-PAK) 0.3 mg/0.3 mL IJ SOAJ injection Inject 0.3 mg into the muscle as needed for anaphylaxis. 2 each 0   glucose blood (ONETOUCH VERIO) test strip Use as instructed 100 each 12   ketoconazole (NIZORAL) 2 % shampoo Apply 1 Application topically 3 (three) times a week.     Lancets (ONETOUCH ULTRASOFT) lancets Use as instructed 100 each 3   levocetirizine (XYZAL ) 5 MG tablet TAKE 1 TABLET (5 MG TOTAL) BY MOUTH DAILY. 90 tablet 1   minoxidil (LONITEN) 2.5 MG tablet Take 2.5 mg by mouth daily.     montelukast  (SINGULAIR ) 10 MG tablet Take 1 tablet (10 mg total) by mouth at bedtime. 90 tablet 1   Multiple Vitamin (MULTIVITAMIN WITH MINERALS) TABS tablet Take 1 tablet by mouth 2 (two) times a week.     omeprazole  (PRILOSEC) 20 MG capsule Take 1 capsule (20 mg total) by mouth daily. 90 capsule 1   OZEMPIC, 0.25 OR 0.5 MG/DOSE, 2 MG/1.5ML SOPN Inject 0.5 mg into the skin every Sunday.     polyethylene glycol (MIRALAX  / GLYCOLAX ) 17 g packet Take 17 g by mouth daily.     prednisoLONE acetate (PRED FORTE) 1 % ophthalmic suspension Place 1 drop into the left eye.     rosuvastatin  (CRESTOR ) 20 MG tablet Take 1 tablet (20 mg total) by mouth every morning. 90 tablet 1   spironolactone  (ALDACTONE ) 25 MG tablet Take 1 tablet (25 mg total) by mouth daily. (Patient taking differently: Take 12.5 mg by mouth daily.) 90 tablet 1   Vitamin D , Ergocalciferol , (DRISDOL ) 1.25 MG (50000 UNIT) CAPS capsule Take 50,000 Units by mouth once a week.     Cyanocobalamin (VITAMIN B-12 PO) Take 1 tablet by mouth daily. (Patient not taking: Reported on 09/18/2024)     No current facility-administered medications for this visit.   Facility-Administered Medications Ordered in Other Visits   Medication Dose  Route Frequency Provider Last Rate Last Admin   heparin  lock flush 100 UNIT/ML injection            heparin  lock flush 100 unit/mL  500 Units Intravenous Once Jazlynn Nemetz R, MD          .  PHYSICAL EXAMINATION: ECOG PERFORMANCE STATUS: 0 - Asymptomatic  Vitals:   09/18/24 1304  BP: 115/79  Pulse: 82  Resp: 16  Temp: 97.8 F (36.6 C)  SpO2: 99%       Filed Weights   09/18/24 1304  Weight: 176 lb 12.8 oz (80.2 kg)        Physical Exam Vitals and nursing note reviewed.  HENT:     Head: Normocephalic and atraumatic.     Mouth/Throat:     Pharynx: Oropharynx is clear.  Eyes:     Extraocular Movements: Extraocular movements intact.     Pupils: Pupils are equal, round, and reactive to light.  Cardiovascular:     Rate and Rhythm: Normal rate and regular rhythm.  Pulmonary:     Comments: Decreased breath sounds bilaterally.  Abdominal:     Palpations: Abdomen is soft.  Musculoskeletal:        General: Normal range of motion.     Cervical back: Normal range of motion.  Skin:    General: Skin is warm.  Neurological:     General: No focal deficit present.     Mental Status: She is alert and oriented to person, place, and time.  Psychiatric:        Behavior: Behavior normal.        Judgment: Judgment normal.      LABORATORY DATA:  I have reviewed the data as listed Lab Results  Component Value Date   WBC 4.3 09/18/2024   HGB 12.0 09/18/2024   HCT 37.6 09/18/2024   MCV 91.9 09/18/2024   PLT 207 09/18/2024   Recent Labs    09/21/23 1006 03/29/24 0939 09/18/24 1308  NA 136 136 137  K 4.7 4.2 4.2  CL 102 104 105  CO2 25 27 24   GLUCOSE 101* 115* 100*  BUN 13 11 15   CREATININE 0.95 1.07* 0.91  CALCIUM  9.4 9.0 9.0  GFRNONAA >60 55* >60  PROT 7.2 6.8 7.0  ALBUMIN 3.7 3.6 3.6  AST 17 22 17   ALT 14 15 11   ALKPHOS 83 85 85  BILITOT 0.5 0.6 0.8    RADIOGRAPHIC STUDIES: I have personally reviewed the radiological images  as listed and agreed with the findings in the report. No results found.   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  Patient currently s/p  adjuvant chemoimmunotherapy [KEYNOTE 522 study].   #lOCT 2024 eft breast- mammogram-  [Dr.Sakai] There are two separate 10 mm groups of coarse heterogeneous calcification in the left breast that are low suspicion for malignancy. S/p  stereotactic guided biopsy- negative- APRIL 2025- NEG.  Mammo Bil Diagnostic - Mammo[Dr.Sakai]- NED.   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  Monitor blood sugars closely. stable  # Bone health- April 2024- BMD measured at Femur Neck Right is 0.879 g/cm2 with a T-score of -1.1. Discussed the role of adjuvant Zometa 4 mg IV q 6 M x3 cycles. Will need dental clearance.  Discussed the potential side effects including but not limited to-infusion reaction; hypocalcemia; and Osteo-necrosis of jaw. Continue  ca+Vit D BID.    # IV access- s/p  Explantation-PIV.   # DISPOSITION: # mastectomy supplies  # follow up in 6 month-  MD; labs- cbc/cmp; Thyroid  profile; possible Zometa;- Dr.B     All questions were answered. The patient/family knows to call the clinic with any problems, questions or concerns.    Morgan JONELLE Joe, MD 09/18/2024 2:14 PM

## 2024-09-18 NOTE — Assessment & Plan Note (Addendum)
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  Patient currently s/p  adjuvant chemoimmunotherapy [KEYNOTE 522 study].   #lOCT 2024 eft breast- mammogram-  [Dr.Sakai] There are two separate 10 mm groups of coarse heterogeneous calcification in the left breast that are low suspicion for malignancy. S/p  stereotactic guided biopsy- negative- APRIL 2025- NEG.  Mammo Bil Diagnostic - Mammo[Dr.Sakai]- NED.   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  Monitor blood sugars closely. stable  # Bone health- April 2024- BMD measured at Femur Neck Right is 0.879 g/cm2 with a T-score of -1.1. Discussed the role of adjuvant Zometa 4 mg IV q 6 M x3 cycles. Will need dental clearance.  Discussed the potential side effects including but not limited to-infusion reaction; hypocalcemia; and Osteo-necrosis of jaw. Continue  ca+Vit D BID.    # IV access- s/p  Explantation-PIV.   # DISPOSITION: # mastectomy supplies  # follow up in 6 month-  MD; labs- cbc/cmp; Thyroid  profile; possible Zometa;- Dr.B

## 2024-09-18 NOTE — Progress Notes (Signed)
 No concerns today

## 2024-09-19 ENCOUNTER — Telehealth: Payer: Self-pay | Admitting: *Deleted

## 2024-09-19 LAB — THYROID PANEL WITH TSH
Free Thyroxine Index: 1.7 (ref 1.2–4.9)
T3 Uptake Ratio: 25 % (ref 24–39)
T4, Total: 6.7 ug/dL (ref 4.5–12.0)
TSH: 1.5 u[IU]/mL (ref 0.450–4.500)

## 2024-09-19 NOTE — Telephone Encounter (Signed)
 Prescription for mastectomy supplies were faxed to [facility] per patient request yesterday.

## 2024-09-26 ENCOUNTER — Telehealth: Payer: Self-pay

## 2024-09-26 NOTE — Telephone Encounter (Signed)
 Pharmacy Quality Measure Review  This patient is appearing on a report for being at risk of failing the adherence measure for diabetes medications this calendar year.   Medication: Ozempic Last fill date: 08/19/24 for 28 day supply  Contacted pharmacy to facilitate refills.  Emony Dormer E. Marsh, PharmD Clinical Pharmacist [facility] [phone]

## 2024-09-27 ENCOUNTER — Other Ambulatory Visit: Payer: Self-pay | Admitting: Nurse Practitioner

## 2024-09-27 DIAGNOSIS — J302 Other seasonal allergic rhinitis: Secondary | ICD-10-CM

## 2024-09-28 ENCOUNTER — Ambulatory Visit: Admitting: Internal Medicine

## 2024-09-28 ENCOUNTER — Other Ambulatory Visit

## 2024-09-28 NOTE — Telephone Encounter (Signed)
 Requested Prescriptions  Pending Prescriptions Disp Refills   Azelastine  HCl 137 MCG/SPRAY SOLN [Pharmacy Med Name: AZELASTINE  0.1% (137 MCG) SPRY] 30 mL 1    Sig: PLACE 1 SPRAY INTO BOTH NOSTRILS 2 (TWO) TIMES DAILY. USE IN EACH NOSTRIL AS DIRECTED     Ear, Nose, and Throat: Nasal Preparations - Antiallergy Passed - 09/28/2024  3:04 PM      Passed - Valid encounter within last 12 months    Recent Outpatient Visits           2 weeks ago Dyslipidemia associated with type 2 diabetes mellitus [facility])   [facility] [name], MD   5 months ago Decreased calculated GFR   [facility] [name], MD   5 months ago    [facility] [name], MD   6 months ago Dyslipidemia associated with type 2 diabetes mellitus [facility])   [facility] [name], MD   8 months ago Sore throat   [facility] Sowles, [name], MD

## 2024-10-02 DIAGNOSIS — Z9011 Acquired absence of right breast and nipple: Secondary | ICD-10-CM | POA: Diagnosis not present

## 2024-10-02 DIAGNOSIS — C50111 Malignant neoplasm of central portion of right female breast: Secondary | ICD-10-CM | POA: Diagnosis not present

## 2024-10-02 DIAGNOSIS — Z4431 Encounter for fitting and adjustment of external right breast prosthesis: Secondary | ICD-10-CM | POA: Diagnosis not present

## 2024-10-05 ENCOUNTER — Ambulatory Visit: Payer: Self-pay

## 2024-10-05 NOTE — Telephone Encounter (Signed)
 FYI Only or Action Required?: FYI only for provider.  Patient was last seen in primary care on 09/13/2024 by [name], MD.  Called Nurse Triage reporting Cyst.  Symptoms began several weeks ago.  Interventions attempted: Nothing.  Symptoms are: unchanged.  Triage Disposition: See PCP When Office is Open (Within 3 Days)  Patient/caregiver understands and will follow disposition?: Yes - appt for Monday                 Copied from CRM #[id number]. Topic: Clinical - Red Word Triage >> Oct 05, 2024 11:32 AM Wess RAMAN wrote: Red Word that prompted transfer to Nurse Triage: Cyst on right side of neck causing pain after touching it. Patient thought something may have bitten her but unsure. Reason for Disposition  [1] Small swelling or lump AND [2] unexplained AND [3] present > 1 week  Answer Assessment - Initial Assessment Questions 1. APPEARANCE of SWELLING: What does it look like?     Limp 2. SIZE: How large is the swelling? (e.g., inches, cm; or compare to size of pinhead, tip of pen, eraser, coin, pea, grape, ping pong ball)      Size of the end of her pinky 3. LOCATION: Where is the swelling located?     Neck right side near base 4. ONSET: When did the swelling start?     A couple of weeks 5. COLOR: What color is it? Is there more than one color?     No discoloration 6. PAIN: Is there any pain? If Yes, ask: How bad is the pain? (Scale 1-10; or mild, moderate, severe)       Pain when touched 7. ITCH: Does it itch? If Yes, ask: How bad is the itch?      no 8. CAUSE: What do you think caused the swelling?     no 9 OTHER SYMPTOMS: Do you have any other symptoms? (e.g., fever)     no  Protocols used: Skin Lump or Localized Swelling-A-AH

## 2024-10-08 ENCOUNTER — Ambulatory Visit: Admitting: Family Medicine

## 2024-10-08 ENCOUNTER — Encounter: Payer: Self-pay | Admitting: Family Medicine

## 2024-10-08 VITALS — BP 112/72 | HR 96 | Resp 16 | Ht 66.0 in | Wt 174.5 lb

## 2024-10-08 DIAGNOSIS — E1169 Type 2 diabetes mellitus with other specified complication: Secondary | ICD-10-CM | POA: Diagnosis not present

## 2024-10-08 DIAGNOSIS — I152 Hypertension secondary to endocrine disorders: Secondary | ICD-10-CM | POA: Diagnosis not present

## 2024-10-08 DIAGNOSIS — Z7985 Long-term (current) use of injectable non-insulin antidiabetic drugs: Secondary | ICD-10-CM | POA: Diagnosis not present

## 2024-10-08 DIAGNOSIS — R221 Localized swelling, mass and lump, neck: Secondary | ICD-10-CM | POA: Diagnosis not present

## 2024-10-08 DIAGNOSIS — E785 Hyperlipidemia, unspecified: Secondary | ICD-10-CM | POA: Diagnosis not present

## 2024-10-08 DIAGNOSIS — R058 Other specified cough: Secondary | ICD-10-CM | POA: Diagnosis not present

## 2024-10-08 DIAGNOSIS — E1159 Type 2 diabetes mellitus with other circulatory complications: Secondary | ICD-10-CM

## 2024-10-08 DIAGNOSIS — Z7984 Long term (current) use of oral hypoglycemic drugs: Secondary | ICD-10-CM | POA: Diagnosis not present

## 2024-10-08 MED ORDER — AIRSUPRA 90-80 MCG/ACT IN AERO: 2.0000 | INHALATION_SPRAY | Freq: Four times a day (QID) | RESPIRATORY_TRACT | 0 refills | Status: DC

## 2024-10-08 NOTE — Progress Notes (Signed)
 Name: Morgan Peterson   MRN: [id number]    DOB: Sep 27, 1951   Date:10/08/2024       Progress Note  Subjective  Chief Complaint  Chief Complaint  Patient presents with   Mass    Cyst on right side of neck causing pain after touching it, unsure if something bit her, Several weeks    Discussed the use of AI scribe software for clinical note transcription with the patient, who gave verbal consent to proceed.  History of Present Illness Morgan Peterson is a 73 year old female who presents with a nodule on her neck and a dry cough.  She noticed a nodule on her neck after scratching an area where she was bitten by something. The nodule is described as a 'little knot' that is not red. It has not grown but has become sore, likely due to frequent touching. She intended to show it during a previous visit but was unable to do so due to travel for a family event.  She has been experiencing a dry cough for a few weeks, more prominent in the morning and returning in the afternoon. No history of asthma, reflux symptoms, shortness of breath, or phlegm production. Her husband smokes, exposing her to secondhand smoke. She recalls working in an environment with mold in the past, which required the use of a rescue inhaler. She has not had a pulmonary function test but has used and inhaler in the past, which she found helpful.  Her current medications include spironolactone  at a half tablet dose and Ozempic at 0.5 mg. She is up to date with her flu and pneumonia vaccinations, having received the PCV13 and PPSV23 vaccines.    Patient Active Problem List   Diagnosis Date Noted   Osteopenia after menopause 09/13/2024   Perennial allergic rhinitis with seasonal variation 09/13/2024   Arrhythmia 03/13/2024   Gastroesophageal reflux disease without esophagitis 03/13/2024   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   Bilateral lower extremity edema 08/27/2022   Hives 08/27/2022   Food allergy  08/05/2022    Atherosclerosis of both carotid arteries 08/05/2022   Migraine aura without headache 08/05/2022   Carcinoma of upper-outer quadrant of right breast in female, estrogen receptor negative (HCC) 05/26/2022   Breast cancer (HCC) 05/10/2022   Goals of care, counseling/discussion 05/10/2022   Encounter for monitoring cardiotoxic drug therapy 05/10/2022   Carotid stenosis 04/21/2022   Hypertension associated with type 2 diabetes mellitus (HCC) 01/20/2021   Intermittent low back pain 08/18/2017   Irritable bowel syndrome with constipation 05/02/2017   Vitamin D  deficiency 09/09/2016   Hiatal hernia 06/02/2016   Allergic rhinitis, seasonal 06/02/2016   Angio-edema 04/30/2016   Cervical radiculopathy at C6 07/31/2015   Neuroma digital nerve 07/31/2015   Dyslipidemia associated with type 2 diabetes mellitus (HCC) 06/30/2015   Cervical disc disease 06/30/2015   Essential hypertension 06/30/2015   History of right breast cancer 03/23/2009    Social History   Tobacco Use   Smoking status: Former    Current packs/day: 0.00    Average packs/day: 0.3 packs/day for 10.0 years (2.5 ttl pk-yrs)    Types: Cigarettes    Start date: 40    Quit date: 1988    Years since quitting: 37.8   Smokeless tobacco: Never   Tobacco comments:    smoking cessation materials not required  Substance Use Topics   Alcohol use: Yes    Comment: wine occ     Current Outpatient Medications:    aspirin  EC 81  MG tablet, Take 1 tablet (81 mg total) by mouth daily., Disp: 30 tablet, Rfl: 0   atenolol  (TENORMIN ) 25 MG tablet, Take 12.5 mg by mouth daily., Disp: , Rfl:    Azelastine  HCl 137 MCG/SPRAY SOLN, PLACE 1 SPRAY INTO BOTH NOSTRILS 2 (TWO) TIMES DAILY. USE IN EACH NOSTRIL AS DIRECTED, Disp: 30 mL, Rfl: 1   betamethasone dipropionate 0.05 % lotion, Apply 1 Application topically daily., Disp: , Rfl:    betamethasone, augmented, (DIPROLENE) 0.05 % lotion, Apply 1 application  topically 2 (two) times daily., Disp: ,  Rfl:    Blood Glucose Monitoring Suppl (ONETOUCH VERIO) w/Device KIT, 1 Device by Does not apply route once., Disp: 1 kit, Rfl: 0   Cyanocobalamin (VITAMIN B-12 PO), Take 1 tablet by mouth daily. (Patient not taking: Reported on 09/18/2024), Disp: , Rfl:    EPINEPHrine  (EPIPEN  2-PAK) 0.3 mg/0.3 mL IJ SOAJ injection, Inject 0.3 mg into the muscle as needed for anaphylaxis., Disp: 2 each, Rfl: 0   glucose blood (ONETOUCH VERIO) test strip, Use as instructed, Disp: 100 each, Rfl: 12   ketoconazole (NIZORAL) 2 % shampoo, Apply 1 Application topically 3 (three) times a week., Disp: , Rfl:    Lancets (ONETOUCH ULTRASOFT) lancets, Use as instructed, Disp: 100 each, Rfl: 3   levocetirizine (XYZAL ) 5 MG tablet, TAKE 1 TABLET (5 MG TOTAL) BY MOUTH DAILY., Disp: 90 tablet, Rfl: 1   minoxidil (LONITEN) 2.5 MG tablet, Take 2.5 mg by mouth daily., Disp: , Rfl:    montelukast  (SINGULAIR ) 10 MG tablet, Take 1 tablet (10 mg total) by mouth at bedtime., Disp: 90 tablet, Rfl: 1   Multiple Vitamin (MULTIVITAMIN WITH MINERALS) TABS tablet, Take 1 tablet by mouth 2 (two) times a week., Disp: , Rfl:    omeprazole  (PRILOSEC) 20 MG capsule, Take 1 capsule (20 mg total) by mouth daily., Disp: 90 capsule, Rfl: 1   OZEMPIC, 0.25 OR 0.5 MG/DOSE, 2 MG/1.5ML SOPN, Inject 0.5 mg into the skin every Sunday., Disp: , Rfl:    polyethylene glycol (MIRALAX  / GLYCOLAX ) 17 g packet, Take 17 g by mouth daily., Disp: , Rfl:    prednisoLONE acetate (PRED FORTE) 1 % ophthalmic suspension, Place 1 drop into the left eye., Disp: , Rfl:    rosuvastatin  (CRESTOR ) 20 MG tablet, Take 1 tablet (20 mg total) by mouth every morning., Disp: 90 tablet, Rfl: 1   spironolactone  (ALDACTONE ) 25 MG tablet, Take 1 tablet (25 mg total) by mouth daily. (Patient taking differently: Take 12.5 mg by mouth daily.), Disp: 90 tablet, Rfl: 1   Vitamin D , Ergocalciferol , (DRISDOL ) 1.25 MG (50000 UNIT) CAPS capsule, Take 50,000 Units by mouth once a week., Disp: , Rfl:   No current facility-administered medications for this visit.  Facility-Administered Medications Ordered in Other Visits:    heparin  lock flush 100 UNIT/ML injection, , , ,    heparin  lock flush 100 unit/mL, 500 Units, Intravenous, Once, Brahmanday, Govinda R, MD  Allergies  Allergen Reactions   Levaquin [Levofloxacin In D5w] Swelling    Other reaction(s): Arthralgia (Joint Pain)   Losartan     Other reaction(s): Angioedema   Metoprolol Swelling    Lip and tongue tingling and numbness   Saxagliptin Other (See Comments)    Increased PVC's  Other reaction(s): irregular heart rate   Amlodipine-Olmesartan    Canagliflozin Other (See Comments)    Bladder pain   Egg White [Egg Protein (Egg White)]     Unknown reaction   Gabapentin   cns side effects.   Glipizide     Other reaction(s): Abdominal pain   Gluten Meal    Lisinopril Swelling    Angioedema   Metformin  And Related Nausea Only   Milk (Cow)    Milk-Related Compounds    Shrimp Extract     Other reaction(s): Other (See Comments)   Tramadol Nausea And Vomiting   Zyprexa  [Olanzapine ]    Actos [Pioglitazone] Other (See Comments) and Nausea And Vomiting    Bladder pain   Atorvastatin  Other (See Comments)    Other reaction(s): Abdominal Pain, Other (See Comments)   Carvedilol  Other (See Comments)    NUMBNESS, TINGLING, ACHING IN ARMS   Prednisone  Palpitations    ROS  Ten systems reviewed and is negative except as mentioned in HPI    Objective  Vitals:   10/08/24 1005  BP: 112/72  Pulse: 96  Resp: 16  SpO2: 99%  Weight: 174 lb 8 oz (79.2 kg)  Height: 5' 6 (1.676 m)    Body mass index is 28.17 kg/m.    Physical Exam  CONSTITUTIONAL: Patient appears well-developed and well-nourished. No distress. HEENT: Head atraumatic, normocephalic, neck supple with a small, non-red cyst under the skin, not attached to underlying structures. CARDIOVASCULAR: Normal rate, regular rhythm and normal heart sounds. No  murmur heard. No BLE edema. PULMONARY: Effort normal and breath sounds normal. No respiratory distress. ABDOMINAL: There is no tenderness or distention. MUSCULOSKELETAL: Normal gait. Without gross motor or sensory deficit. PSYCHIATRIC: Patient has a normal mood and affect. Behavior is normal. Judgment and thought content normal.     Recent Results (from the past 2160 hours)  Hemoglobin A1c     Status: None   Collection Time: 08/30/24 12:00 AM  Result Value Ref Range   Hemoglobin A1C 6.3   Urine Microalbumin w/creat. ratio     Status: None   Collection Time: 09/13/24 11:46 AM  Result Value Ref Range   Creatinine, Urine 180 20 - 275 mg/dL   Microalb, Ur 0.5 mg/dL    Comment: Reference Range Not established    Microalb Creat Ratio 3 <30 mg/g creat    Comment: . The ADA defines abnormalities in albumin excretion as follows: SABRA Albuminuria Category        Result (mg/g creatinine) . Normal to Mildly increased   <30 Moderately increased         30-299  Severely increased           > OR = 300 . The ADA recommends that at least two of three specimens collected within a 3-6 month period be abnormal before considering a patient to be within a diagnostic category.   Thyroid  Panel With TSH     Status: None   Collection Time: 09/18/24  1:08 PM  Result Value Ref Range   TSH 1.500 0.450 - 4.500 uIU/mL   T4, Total 6.7 4.5 - 12.0 ug/dL   T3 Uptake Ratio 25 24 - 39 %   Free Thyroxine Index 1.7 1.2 - 4.9    Comment: (NOTE) Performed At: [facility] [address] [place], [place] [id number] Jennette Shorter MD Ey:[id number]   CMP (Cancer Center only)     Status: Abnormal   Collection Time: 09/18/24  1:08 PM  Result Value Ref Range   Sodium 137 135 - 145 mmol/L   Potassium 4.2 3.5 - 5.1 mmol/L   Chloride 105 98 - 111 mmol/L   CO2 24 22 - 32 mmol/L   Glucose, Bld 100 (H)  70 - 99 mg/dL    Comment: Glucose reference range applies only to samples taken after fasting for at least  8 hours.   BUN 15 8 - 23 mg/dL   Creatinine 9.08 9.55 - 1.00 mg/dL   Calcium  9.0 8.9 - 10.3 mg/dL   Total Protein 7.0 6.5 - 8.1 g/dL   Albumin 3.6 3.5 - 5.0 g/dL   AST 17 15 - 41 U/L   ALT 11 0 - 44 U/L   Alkaline Phosphatase 85 38 - 126 U/L   Total Bilirubin 0.8 0.0 - 1.2 mg/dL   GFR, Estimated >39 >39 mL/min    Comment: (NOTE) Calculated using the CKD-EPI Creatinine Equation (2021)    Anion gap 8 5 - 15    Comment: Performed at [facility], [address] [place], [place] [address]  CBC with Differential (Cancer Center Only)     Status: None   Collection Time: 09/18/24  1:08 PM  Result Value Ref Range   WBC Count 4.3 4.0 - 10.5 K/uL   RBC 4.09 3.87 - 5.11 MIL/uL   Hemoglobin 12.0 12.0 - 15.0 g/dL   HCT 62.3 63.9 - 53.9 %   MCV 91.9 80.0 - 100.0 fL   MCH 29.3 26.0 - 34.0 pg   MCHC 31.9 30.0 - 36.0 g/dL   RDW 87.2 88.4 - 84.4 %   Platelet Count 207 150 - 400 K/uL   nRBC 0.0 0.0 - 0.2 %   Neutrophils Relative % 61 %   Neutro Abs 2.6 1.7 - 7.7 K/uL   Lymphocytes Relative 29 %   Lymphs Abs 1.3 0.7 - 4.0 K/uL   Monocytes Relative 7 %   Monocytes Absolute 0.3 0.1 - 1.0 K/uL   Eosinophils Relative 2 %   Eosinophils Absolute 0.1 0.0 - 0.5 K/uL   Basophils Relative 1 %   Basophils Absolute 0.0 0.0 - 0.1 K/uL   Immature Granulocytes 0 %   Abs Immature Granulocytes 0.01 0.00 - 0.07 K/uL    Comment: Performed at [facility], [address] [place], [place] [address]      Assessment & Plan Chronic cough Intermittent dry cough, likely due to environmental allergies or mold exposure. No asthma, reflux, or shortness of breath. Previous use of rescue inhaler. - Prescribe AirSupra inhaler, use as needed up to four times daily. - Advise to rinse mouth after inhaler use. - Continue allergy  medication. - If cough persists beyond two weeks, refer to pulmonologist for evaluation and pulmonary function test.  Neck cyst Small, non-red, subcutaneous nodule on the neck,  likely a cyst. Not attached to underlying structures. Surgical removal is an option if bothersome. Discussed recurrence risk if the entire sac is not removed. - Refer to surgeon for evaluation and possible removal of neck cyst. - Document cyst location with photograph in medical record.  Hypertension associated with DM Blood pressure well-controlled at 112/72 mmHg. Current medication regimen includes spironolactone . Advised against increasing dose to avoid potential hypotension. - Continue current dose of spironolactone  (half tablet).  Type 2 diabetes mellitus with dyslipidemia No specific discussion regarding diabetes management. Current medication includes Ozempic at 0.5 mg. - Continue current diabetes management regimen with Ozempic 0.5 mg.

## 2024-10-10 ENCOUNTER — Other Ambulatory Visit: Payer: Self-pay | Admitting: Family Medicine

## 2024-10-10 DIAGNOSIS — K219 Gastro-esophageal reflux disease without esophagitis: Secondary | ICD-10-CM

## 2024-10-19 ENCOUNTER — Other Ambulatory Visit: Payer: Self-pay | Admitting: Family Medicine

## 2024-10-19 DIAGNOSIS — K219 Gastro-esophageal reflux disease without esophagitis: Secondary | ICD-10-CM

## 2024-10-19 NOTE — Progress Notes (Signed)
 Not seen

## 2024-10-22 DIAGNOSIS — R519 Headache, unspecified: Secondary | ICD-10-CM | POA: Diagnosis not present

## 2024-10-22 DIAGNOSIS — J069 Acute upper respiratory infection, unspecified: Secondary | ICD-10-CM | POA: Diagnosis not present

## 2024-10-30 ENCOUNTER — Other Ambulatory Visit: Payer: Self-pay | Admitting: Family Medicine

## 2024-10-30 DIAGNOSIS — J302 Other seasonal allergic rhinitis: Secondary | ICD-10-CM

## 2024-11-01 NOTE — Telephone Encounter (Signed)
 Requested Prescriptions  Pending Prescriptions Disp Refills   Azelastine  HCl 137 MCG/SPRAY SOLN [Pharmacy Med Name: AZELASTINE  0.1% (137 MCG) SPRY] 90 mL 0    Sig: PLACE 1 SPRAY INTO BOTH NOSTRILS 2 (TWO) TIMES DAILY. USE IN EACH NOSTRIL AS DIRECTED     Ear, Nose, and Throat: Nasal Preparations - Antiallergy Passed - 11/01/2024 11:02 AM      Passed - Valid encounter within last 12 months    Recent Outpatient Visits           3 weeks ago Nodule of neck   [facility] Bishop, Dorette, MD   1 month ago Dyslipidemia associated with type 2 diabetes mellitus [facility])   [facility] [name] Dorette, MD   6 months ago Decreased calculated GFR   [facility] Sowles, [name], MD   7 months ago Dyslipidemia associated with type 2 diabetes mellitus [facility])   [facility] [name] Dorette, MD   9 months ago Sore throat   [facility] Sowles, [name], MD

## 2024-11-05 NOTE — Progress Notes (Signed)
 Pharmacy Quality Measure Review  This patient is appearing on a report for being at risk of failing the adherence measure for diabetes medications this calendar year.   Medication: Ozempic Last fill date: 10/21/24 for 28 day supply  Insurance report was not up to date. No action needed at this time.   Nikeisha Klutz E. Marsh, PharmD, CPP Clinical Pharmacist [facility] [phone]

## 2024-11-12 ENCOUNTER — Emergency Department

## 2024-11-12 ENCOUNTER — Observation Stay
Admission: EM | Admit: 2024-11-12 | Discharge: 2024-11-14 | Disposition: A | Discharge status: Home / Self Care | Attending: Hospitalist | Admitting: Hospitalist

## 2024-11-12 ENCOUNTER — Other Ambulatory Visit: Payer: Self-pay

## 2024-11-12 DIAGNOSIS — R918 Other nonspecific abnormal finding of lung field: Secondary | ICD-10-CM | POA: Diagnosis not present

## 2024-11-12 DIAGNOSIS — E1169 Type 2 diabetes mellitus with other specified complication: Secondary | ICD-10-CM | POA: Insufficient documentation

## 2024-11-12 DIAGNOSIS — J069 Acute upper respiratory infection, unspecified: Secondary | ICD-10-CM | POA: Insufficient documentation

## 2024-11-12 DIAGNOSIS — I6529 Occlusion and stenosis of unspecified carotid artery: Secondary | ICD-10-CM | POA: Diagnosis not present

## 2024-11-12 DIAGNOSIS — F1090 Alcohol use, unspecified, uncomplicated: Secondary | ICD-10-CM | POA: Insufficient documentation

## 2024-11-12 DIAGNOSIS — Z87891 Personal history of nicotine dependence: Secondary | ICD-10-CM | POA: Insufficient documentation

## 2024-11-12 DIAGNOSIS — L659 Nonscarring hair loss, unspecified: Secondary | ICD-10-CM | POA: Insufficient documentation

## 2024-11-12 DIAGNOSIS — E119 Type 2 diabetes mellitus without complications: Secondary | ICD-10-CM

## 2024-11-12 DIAGNOSIS — J019 Acute sinusitis, unspecified: Secondary | ICD-10-CM | POA: Diagnosis not present

## 2024-11-12 DIAGNOSIS — I10 Essential (primary) hypertension: Secondary | ICD-10-CM | POA: Insufficient documentation

## 2024-11-12 DIAGNOSIS — J939 Pneumothorax, unspecified: Secondary | ICD-10-CM | POA: Diagnosis not present

## 2024-11-12 DIAGNOSIS — K219 Gastro-esophageal reflux disease without esophagitis: Secondary | ICD-10-CM | POA: Insufficient documentation

## 2024-11-12 DIAGNOSIS — R0602 Shortness of breath: Secondary | ICD-10-CM | POA: Diagnosis not present

## 2024-11-12 DIAGNOSIS — E785 Hyperlipidemia, unspecified: Secondary | ICD-10-CM | POA: Diagnosis not present

## 2024-11-12 DIAGNOSIS — Z853 Personal history of malignant neoplasm of breast: Secondary | ICD-10-CM | POA: Diagnosis not present

## 2024-11-12 DIAGNOSIS — R9389 Abnormal findings on diagnostic imaging of other specified body structures: Secondary | ICD-10-CM | POA: Diagnosis not present

## 2024-11-12 DIAGNOSIS — K449 Diaphragmatic hernia without obstruction or gangrene: Secondary | ICD-10-CM | POA: Diagnosis not present

## 2024-11-12 DIAGNOSIS — Z79899 Other long term (current) drug therapy: Secondary | ICD-10-CM | POA: Insufficient documentation

## 2024-11-12 DIAGNOSIS — R42 Dizziness and giddiness: Secondary | ICD-10-CM | POA: Diagnosis not present

## 2024-11-12 DIAGNOSIS — J90 Pleural effusion, not elsewhere classified: Secondary | ICD-10-CM | POA: Diagnosis not present

## 2024-11-12 DIAGNOSIS — R0789 Other chest pain: Secondary | ICD-10-CM | POA: Diagnosis not present

## 2024-11-12 LAB — CBC WITH DIFFERENTIAL/PLATELET
Abs Immature Granulocytes: 0.01 K/uL (ref 0.00–0.07)
Basophils Absolute: 0 K/uL (ref 0.0–0.1)
Basophils Relative: 0 %
Eosinophils Absolute: 0 K/uL (ref 0.0–0.5)
Eosinophils Relative: 0 %
HCT: 40.8 % (ref 36.0–46.0)
Hemoglobin: 12.9 g/dL (ref 12.0–15.0)
Immature Granulocytes: 0 %
Lymphocytes Relative: 16 %
Lymphs Abs: 0.9 K/uL (ref 0.7–4.0)
MCH: 29.1 pg (ref 26.0–34.0)
MCHC: 31.6 g/dL (ref 30.0–36.0)
MCV: 92.1 fL (ref 80.0–100.0)
Monocytes Absolute: 0.3 K/uL (ref 0.1–1.0)
Monocytes Relative: 5 %
Neutro Abs: 4.3 K/uL (ref 1.7–7.7)
Neutrophils Relative %: 79 %
Platelets: 275 K/uL (ref 150–400)
RBC: 4.43 MIL/uL (ref 3.87–5.11)
RDW: 12.9 % (ref 11.5–15.5)
WBC: 5.5 K/uL (ref 4.0–10.5)
nRBC: 0 % (ref 0.0–0.2)

## 2024-11-12 LAB — COMPREHENSIVE METABOLIC PANEL WITH GFR
ALT: 8 U/L (ref 0–44)
AST: 22 U/L (ref 15–41)
Albumin: 4 g/dL (ref 3.5–5.0)
Alkaline Phosphatase: 95 U/L (ref 38–126)
Anion gap: 13 (ref 5–15)
BUN: 6 mg/dL — ABNORMAL LOW (ref 8–23)
CO2: 25 mmol/L (ref 22–32)
Calcium: 9.6 mg/dL (ref 8.9–10.3)
Chloride: 101 mmol/L (ref 98–111)
Creatinine, Ser: 0.83 mg/dL (ref 0.44–1.00)
GFR, Estimated: >60 mL/min (ref >60)
Glucose, Bld: 116 mg/dL — ABNORMAL HIGH (ref 70–99)
Potassium: 4 mmol/L (ref 3.5–5.1)
Sodium: 139 mmol/L (ref 135–145)
Total Bilirubin: 0.4 mg/dL (ref 0.0–1.2)
Total Protein: 7.3 g/dL (ref 6.5–8.1)

## 2024-11-12 LAB — APTT: aPTT: 27 s (ref 24–36)

## 2024-11-12 LAB — LACTATE DEHYDROGENASE: LDH: 252 U/L — ABNORMAL HIGH (ref 105–235)

## 2024-11-12 LAB — PROTIME-INR
INR: 1 (ref 0.8–1.2)
Prothrombin Time: 13.8 s (ref 11.4–15.2)

## 2024-11-12 LAB — CBG MONITORING, ED: Glucose-Capillary: 109 mg/dL — ABNORMAL HIGH (ref 70–99)

## 2024-11-12 MED ORDER — DM-GUAIFENESIN ER 30-600 MG PO TB12
1.0000 | ORAL_TABLET | Freq: Two times a day (BID) | ORAL | Status: DC | PRN
Start: 2024-11-12 — End: 2024-11-14

## 2024-11-12 MED ORDER — IOHEXOL 300 MG/ML  SOLN
75.0000 mL | Freq: Once | INTRAMUSCULAR | Status: AC | PRN
  Administered 2024-11-12: 75 mL via INTRAVENOUS

## 2024-11-12 MED ORDER — ALBUTEROL SULFATE (2.5 MG/3ML) 0.083% IN NEBU: 2.5000 mg | INHALATION_SOLUTION | RESPIRATORY_TRACT | Status: DC | PRN

## 2024-11-12 MED ORDER — ONDANSETRON HCL 4 MG/2ML IJ SOLN: 4.0000 mg | Freq: Three times a day (TID) | INTRAMUSCULAR | Status: DC | PRN

## 2024-11-12 MED ORDER — LORATADINE 10 MG PO TABS
10.0000 mg | ORAL_TABLET | Freq: Every day | ORAL | Status: DC
  Administered 2024-11-12 – 2024-11-14 (×3): 10 mg via ORAL
  Filled 2024-11-12 (×3): qty 1

## 2024-11-12 MED ORDER — MONTELUKAST SODIUM 10 MG PO TABS
10.0000 mg | ORAL_TABLET | Freq: Every day | ORAL | Status: DC
  Administered 2024-11-12 – 2024-11-13 (×2): 10 mg via ORAL
  Filled 2024-11-12 (×2): qty 1

## 2024-11-12 MED ORDER — INSULIN ASPART 100 UNIT/ML IJ SOLN: 0.0000 [IU] | Freq: Every day | INTRAMUSCULAR | Status: DC

## 2024-11-12 MED ORDER — HYDRALAZINE HCL 20 MG/ML IJ SOLN
5.0000 mg | INTRAMUSCULAR | Status: DC | PRN
Start: 2024-11-12 — End: 2024-11-14

## 2024-11-12 MED ORDER — LEVOCETIRIZINE DIHYDROCHLORIDE 5 MG PO TABS: 5.0000 mg | ORAL_TABLET | Freq: Every day | ORAL | Status: DC

## 2024-11-12 MED ORDER — INSULIN ASPART 100 UNIT/ML IJ SOLN
0.0000 [IU] | Freq: Three times a day (TID) | INTRAMUSCULAR | Status: DC
  Administered 2024-11-14: 1 [IU] via SUBCUTANEOUS
  Filled 2024-11-12: qty 1

## 2024-11-12 MED ORDER — ACETAMINOPHEN 325 MG PO TABS
650.0000 mg | ORAL_TABLET | Freq: Four times a day (QID) | ORAL | Status: DC | PRN
  Administered 2024-11-13 (×2): 650 mg via ORAL
  Filled 2024-11-12 (×2): qty 2

## 2024-11-12 NOTE — ED Provider Notes (Signed)
 [facility] Provider Note    Event Date/Time   First MD Initiated Contact with Patient 11/12/24 1621     (approximate)  History   Chief Complaint: Shortness of Breath  HPI  Morgan Peterson is a 73 y.o. female with a past medical history of asthma, hypertension, hyperlipidemia, breast cancer 2023, presents to the emergency department for shortness of breath referred from [facility] clinic.  According to the patient she went to [facility] clinic today for worsening shortness of breath that has been progressively worsening over the last 3 weeks or so along with a cough.  He has completed a course of antibiotics without improvement.  They did a chest x-ray and they were concerned for pneumothorax so they sent the patient to the emergency department.  I have reviewed the chest x-ray images and it appears more consistent with a left-sided effusion.  Possible hiatal hernia as well.  Physical Exam   Triage Vital Signs: ED Triage Vitals [11/12/24 1535]  Encounter Vitals Group     BP (!) 147/98     Girls Systolic BP Percentile      Girls Diastolic BP Percentile      Boys Systolic BP Percentile      Boys Diastolic BP Percentile      Pulse Rate 91     Resp 20     Temp 98.3 F (36.8 C)     Temp Source Oral     SpO2 99 %     Weight 175 lb (79.4 kg)     Height 5' 6 (1.676 m)     Head Circumference      Peak Flow      Pain Score 0     Pain Loc      Pain Education      Exclude from Growth Chart     Most recent vital signs: Vitals:   11/12/24 1535  BP: (!) 147/98  Pulse: 91  Resp: 20  Temp: 98.3 F (36.8 C)  SpO2: 99%    General: Awake, no distress.  CV:  Good peripheral perfusion.  Regular rate and rhythm  Resp:  Normal effort.  Equal breath sounds bilaterally.  Abd:  No distention.  Soft, nontender.  No rebound or guarding.  ED Results / Procedures / Treatments   EKG  EKG viewed and interpreted by myself shows a normal sinus rhythm at 93 bpm with a  narrow QRS, normal axis, normal intervals, no concerning ST changes.  RADIOLOGY  I have reviewed interpreted the x-ray images patient peers to have a very large left-sided pleural effusion possible hiatal hernia.   MEDICATIONS ORDERED IN ED: Medications - No data to display   IMPRESSION / MDM / ASSESSMENT AND PLAN / ED COURSE  I reviewed the triage vital signs and the nursing notes.  Patient's presentation is most consistent with acute presentation with potential threat to life or bodily function.  Patient presents to the emergency department for worsening shortness of breath over the last 3 weeks.  X-ray appears to show a left-sided pleural effusion.  No history of pleural effusion previously.  No lower extremity edema.  Given the patient's recent history of breast cancer status posttreatment in 2023 this would be highly concerning for possible malignant effusion.  We will obtain a CT scan with contrast of the chest to evaluate further.  Given the degree of effusion patient will need to be admitted for IR thoracentesis and likely further oncology workup.  Patient CT scan  unfortunately shows a large left pleural effusion with small right pleural effusion with nodular pleura concerning for metastatic spread as well as some enlarged right-sided lymph nodes again concern for metastatic spread.  I updated the patient on the unfortunate findings.  We will admit to the hospital service for further workup and treatment likely IR drainage tomorrow.  I spoke to Dr. Babara of oncology and she will see the patient tomorrow as well.  FINAL CLINICAL IMPRESSION(S) / ED DIAGNOSES   Pleural effusion Dyspnea   Note:  This document was prepared using Dragon voice recognition software and may include unintentional dictation errors.   Dorothyann Drivers, MD 11/12/24 [phone]

## 2024-11-12 NOTE — ED Triage Notes (Signed)
 First nurse note: Pt to ED via POV from [facility]. Xray + for pneumothorax

## 2024-11-12 NOTE — H&P (Signed)
 History and Physical    Morgan Peterson FMW:[id number] DOB: 1951-07-12 DOA: 11/12/2024  Referring MD/NP/PA:   PCP: Sowles, [name], MD   Patient coming from:  The patient is coming from home.     Chief Complaint: Cough and SOB  HPI: Morgan Peterson is a 73 y.o. female with medical history significant of breast cancer 2023 (s/p of right mastectomy and immunotherapy), HTN, HLD, DM, asthma, GERD, hiatal hernia, IBS, neuropathy, carotid artery stenosis, C-6 radiculopathy, who presents with and SOB.  Pt states that she has dry cough for almost 3 weeks and SOB in the past several days.  No chest pain, fever or chills.  She was seen in urgent care and treated with 7-day course of amoxicillin  without significant improvement.  Due to worsening shortness of breath, she was seen in [facility] today, where she had chest x-ray which showed left pleural effusion and hiatal hernia.  Patient was sent to ED for further evaluation and treatment.  Patient does not have nausea, vomiting, diarrhea or abdominal pain.  No symptoms UTI.   Data reviewed independently and ED Course: pt was found to have WBC 5.5, GFR > 60, temperature normal, blood pressure 147/98, heart rate 91, RR 20, oxygen sat 99% on room air.  Patient is placed in telemetry bed for observation.  Dr. Babara of oncology is consulted.  CT of chest with contrast: 1. Large left pleural effusion with pleural nodularity, consistent with pleural metastasis. 2. Tiny right pleural effusion with findings suspicious for right pleural and pulmonary parenchymal metastases including nodularity along the right major fissure and scattered right lung nodules up to 4 mm. 3. Right axillary nodal metastasis in the setting of prior right mastectomy. 4. Large hiatal hernia with approximately half of the stomach positioned in the lower chest.   EKG: I have personally reviewed.  Sinus rhythm, QTc 422, low voltage, borderline left axis deviation   Review of Systems:    General: no fevers, chills, no body weight gain, fatigue HEENT: no blurry vision, hearing changes or sore throat Respiratory: has dyspnea, coughing, no wheezing CV: no chest pain, no palpitations GI: no nausea, vomiting, abdominal pain, diarrhea, constipation GU: no dysuria, burning on urination, increased urinary frequency, hematuria  Ext: no leg edema Neuro: no unilateral weakness, numbness, or tingling, no vision change or hearing loss Skin: no rash, no skin tear. MSK: No muscle spasm, no deformity, no limitation of range of movement in spin Heme: No easy bruising.  Travel history: No recent long distant travel.   Allergy :  Allergies  Allergen Reactions   Levaquin [Levofloxacin In D5w] Swelling    Other reaction(s): Arthralgia (Joint Pain)   Losartan     Other reaction(s): Angioedema   Metoprolol Swelling    Lip and tongue tingling and numbness   Saxagliptin Other (See Comments)    Increased PVC's  Other reaction(s): irregular heart rate   Amlodipine-Olmesartan    Canagliflozin Other (See Comments)    Bladder pain   Egg White [Egg Protein (Egg White)]     Unknown reaction   Gabapentin      cns side effects.   Glipizide     Other reaction(s): Abdominal pain   Gluten Meal    Lisinopril Swelling    Angioedema   Metformin  And Related Nausea Only   Milk (Cow)    Milk-Related Compounds    Shrimp Extract     Other reaction(s): Other (See Comments)   Tramadol Nausea And Vomiting   Zyprexa  [Olanzapine ]  Actos [Pioglitazone] Other (See Comments) and Nausea And Vomiting    Bladder pain   Atorvastatin  Other (See Comments)    Other reaction(s): Abdominal Pain, Other (See Comments)   Carvedilol  Other (See Comments)    NUMBNESS, TINGLING, ACHING IN ARMS   Prednisone  Palpitations    Past Medical History:  Diagnosis Date   Appetite loss    Asthma    Cervical radiculopathy    Chemotherapy-induced nausea    Hiatal hernia    Hypercholesterolemia    Hyperlipidemia     Hypertension    IBS (irritable bowel syndrome)    Malignant neoplasm of upper-outer quadrant of female breast (HCC) 04/11/2009   Right breast, invasive ductal carcinoma, 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 Arimidex therapy completed September 2015   Mild mitral valve prolapse    Neuropathy    feet, from chemo   Personal history of malignant neoplasm of breast 2010   Personal history of radiation therapy 2010   mammosite   T2DM (type 2 diabetes mellitus) (HCC) 2011    Past Surgical History:  Procedure Laterality Date   ABDOMINAL EXPLORATION SURGERY  2000   ovarian cyst   BREAST BIOPSY Right 2010   +    BREAST BIOPSY Left 04/06/2023   Left Breast Stereo Bx X Clip - path pending   BREAST BIOPSY Left 04/06/2023   Left Breast Stereo Bx, Coil Clip - path pending   BREAST BIOPSY Left 04/06/2023   MM LT BREAST BX W LOC DEV 1ST LESION IMAGE BX SPEC STEREO GUIDE 04/06/2023 [facility]   BREAST BIOPSY Left 04/06/2023   MM LT BREAST BX W LOC DEV EA AD LESION IMG BX SPEC STEREO GUIDE 04/06/2023 [facility]   BREAST EXCISIONAL BIOPSY Right 2010   + mammo site inasive mammo ca   BREAST LUMPECTOMY Right 2010   [facility]   COLONOSCOPY  07/19/2012   Normal exam, Dr. Dessa   COLONOSCOPY WITH PROPOFOL  N/A 04/21/2022   Procedure: COLONOSCOPY WITH PROPOFOL ;  Surgeon: Dessa Reyes ORN, MD;  Location: [facility];  Service: Endoscopy;  Laterality: N/A;   ESOPHAGOGASTRODUODENOSCOPY (EGD) WITH PROPOFOL  N/A 10/22/2022   Procedure: ESOPHAGOGASTRODUODENOSCOPY (EGD) WITH PROPOFOL ;  Surgeon: Jinny Carmine, MD;  Location: [facility];  Service: Endoscopy;  Laterality: N/A;  Diabetic   MASTECTOMY Right 2010   partial/ lumpectomy   PORTACATH PLACEMENT Left 05/19/2022   Procedure: INSERTION PORT-A-CATH;  Surgeon: Dessa Reyes ORN, MD;  Location: [facility];  Service: General;  Laterality: Left;   SIMPLE MASTECTOMY WITH AXILLARY SENTINEL  NODE BIOPSY Right 04/26/2022   Procedure: SIMPLE MASTECTOMY WITH AXILLARY SENTINEL NODE BIOPSY;  Surgeon: Dessa Reyes ORN, MD;  Location: [facility];  Service: General;  Laterality: Right;  RNFA to assist   TUBAL LIGATION  20 years ago    Social History:  reports that she quit smoking about 37 years ago. Her smoking use included cigarettes. She started smoking about 47 years ago. She has a 2.5 pack-year smoking history. She has never used smokeless tobacco. She reports current alcohol use. She reports that she does not use drugs.  Family History:  Family History  Problem Relation Age of Onset   Osteoporosis Mother    Diabetes Father    Kidney disease Father    Diabetes Brother    Breast cancer Maternal Aunt    Bladder Cancer Maternal Aunt    Cervical cancer Maternal Aunt    Colon cancer Maternal Uncle  Lung cancer Maternal Uncle      Prior to Admission medications   Medication Sig Start Date End Date Taking? Authorizing Provider  Albuterol -Budesonide  (AIRSUPRA ) 90-80 MCG/ACT AERO Inhale 2 puffs into the lungs 4 (four) times daily. 10/08/24   Sowles, [name], MD  aspirin  EC 81 MG tablet Take 1 tablet (81 mg total) by mouth daily. 08/18/17   Sowles, [name], MD  atenolol  (TENORMIN ) 25 MG tablet Take 12.5 mg by mouth daily.    [provider]  Azelastine  HCl 137 MCG/SPRAY SOLN PLACE 1 SPRAY INTO BOTH NOSTRILS 2 (TWO) TIMES DAILY. USE IN EACH NOSTRIL AS DIRECTED 11/01/24   [name], [name], MD  betamethasone dipropionate 0.05 % lotion Apply 1 Application topically daily. 01/02/24   [provider]  betamethasone, augmented, (DIPROLENE) 0.05 % lotion Apply 1 application  topically 2 (two) times daily. 11/05/21   [provider]  Blood Glucose Monitoring Suppl (ONETOUCH VERIO) w/Device KIT 1 Device by Does not apply route once. 01/12/16   Harrie Pump, MD  Cyanocobalamin (VITAMIN B-12 PO) Take 1 tablet by mouth daily. Patient not taking: Reported on 09/18/2024     [provider]  EPINEPHrine  (EPIPEN  2-PAK) 0.3 mg/0.3 mL IJ SOAJ injection Inject 0.3 mg into the muscle as needed for anaphylaxis. 03/13/24   Sowles, [name], MD  glucose blood (ONETOUCH VERIO) test strip Use as instructed 02/13/18   Sowles, [name], MD  ketoconazole (NIZORAL) 2 % shampoo Apply 1 Application topically 3 (three) times a week. 01/02/24   [provider]  Lancets JANETT ULTRASOFT) lancets Use as instructed 01/07/16   Harrie Pump, MD  levocetirizine (XYZAL ) 5 MG tablet TAKE 1 TABLET (5 MG TOTAL) BY MOUTH DAILY. 06/14/24   Sowles, [name], MD  minoxidil  (LONITEN ) 2.5 MG tablet Take 2.5 mg by mouth daily. 01/02/24   [provider]  montelukast  (SINGULAIR ) 10 MG tablet Take 1 tablet (10 mg total) by mouth at bedtime. 09/13/24   Sowles, [name], MD  Multiple Vitamin (MULTIVITAMIN WITH MINERALS) TABS tablet Take 1 tablet by mouth 2 (two) times a week.    [provider]  omeprazole  (PRILOSEC) 20 MG capsule Take 1 capsule (20 mg total) by mouth daily. 09/13/24   Sowles, [name], MD  OZEMPIC, 0.25 OR 0.5 MG/DOSE, 2 MG/1.5ML SOPN Inject 0.5 mg into the skin every Sunday. 01/16/19   Cherilyn Debby CROME, MD  polyethylene glycol (MIRALAX  / GLYCOLAX ) 17 g packet Take 17 g by mouth daily.    [provider]  prednisoLONE acetate (PRED FORTE) 1 % ophthalmic suspension Place 1 drop into the left eye. 12/08/23   [provider]  rosuvastatin  (CRESTOR ) 20 MG tablet Take 1 tablet (20 mg total) by mouth every morning. 09/13/24   Sowles, [name], MD  spironolactone  (ALDACTONE ) 25 MG tablet Take 1 tablet (25 mg total) by mouth daily. Patient taking differently: Take 12.5 mg by mouth daily. 09/13/24   Sowles, [name], MD  Vitamin D , Ergocalciferol , (DRISDOL ) 1.25 MG (50000 UNIT) CAPS capsule Take 50,000 Units by mouth once a week. 12/27/23   [provider]    Physical Exam: Vitals:   11/12/24 1535 11/12/24 2055 11/12/24 2358  BP: (!) 147/98   128/82  Pulse: 91  97  Resp: 20  (!) 23  Temp: 98.3 F (36.8 C) 98.1 F (36.7 C)   TempSrc: Oral Oral   SpO2: 99%  97%  Weight: 79.4 kg    Height: 5' 6 (1.676 m)     General: Not in acute distress  HEENT:       Eyes: PERRL, EOMI, no jaundice       ENT: No discharge from the ears and nose, no pharynx injection, no tonsillar enlargement.        Neck: No JVD, no bruit, no mass felt. Heme: No neck lymph node enlargement. Cardiac: S1/S2, RRR, No murmurs, No gallops or rubs. Respiratory: has decreased air movement on the left side GI: Soft, nondistended, nontender, no rebound pain, no organomegaly, BS present. GU: No hematuria Ext: No pitting leg edema bilaterally. 1+DP/PT pulse bilaterally. Musculoskeletal: No joint deformities, No joint redness or warmth, no limitation of ROM in spin. Skin: No rashes.  Neuro: Alert, oriented X3, cranial nerves II-XII grossly intact, moves all extremities normally.  Psych: Patient is not psychotic, no suicidal or hemocidal ideation.  Labs on Admission: I have personally reviewed following labs and imaging studies  CBC: Recent Labs  Lab 11/12/24 1539  WBC 5.5  NEUTROABS 4.3  HGB 12.9  HCT 40.8  MCV 92.1  PLT 275   Basic Metabolic Panel: Recent Labs  Lab 11/12/24 1539  NA 139  K 4.0  CL 101  CO2 25  GLUCOSE 116*  BUN 6*  CREATININE 0.83  CALCIUM  9.6   GFR: Estimated Creatinine Clearance: 64.1 mL/min (by C-G formula based on SCr of 0.83 mg/dL). Liver Function Tests: Recent Labs  Lab 11/12/24 1539  AST 22  ALT 8  ALKPHOS 95  BILITOT 0.4  PROT 7.3  ALBUMIN 4.0   No results for input(s): LIPASE, AMYLASE in the last 168 hours. No results for input(s): AMMONIA in the last 168 hours. Coagulation Profile: Recent Labs  Lab 11/12/24 2012  INR 1.0   Cardiac Enzymes: No results for input(s): CKTOTAL, CKMB, CKMBINDEX, TROPONINI in the last 168 hours. BNP (last 3 results) No results for input(s): PROBNP in the  last 8760 hours. HbA1C: No results for input(s): HGBA1C in the last 72 hours. CBG: Recent Labs  Lab 11/12/24 2233  GLUCAP 109*   Lipid Profile: No results for input(s): CHOL, HDL, LDLCALC, TRIG, CHOLHDL, LDLDIRECT in the last 72 hours. Thyroid  Function Tests: No results for input(s): TSH, T4TOTAL, FREET4, T3FREE, THYROIDAB in the last 72 hours. Anemia Panel: No results for input(s): VITAMINB12, FOLATE, FERRITIN, TIBC, IRON, RETICCTPCT in the last 72 hours. Urine analysis:    Component Value Date/Time   COLORURINE AMBER (A) 12/17/2022 1410   APPEARANCEUR CLOUDY (A) 12/17/2022 1410   LABSPEC 1.028 12/17/2022 1410   PHURINE 5.0 12/17/2022 1410   GLUCOSEU NEGATIVE 12/17/2022 1410   HGBUR NEGATIVE 12/17/2022 1410   BILIRUBINUR NEGATIVE 12/17/2022 1410   BILIRUBINUR neg 10/06/2017 1120   KETONESUR 5 (A) 12/17/2022 1410   PROTEINUR 30 (A) 12/17/2022 1410   UROBILINOGEN 0.2 10/06/2017 1120   NITRITE NEGATIVE 12/17/2022 1410   LEUKOCYTESUR NEGATIVE 12/17/2022 1410   Sepsis Labs: @LABRCNTIP (procalcitonin:4,lacticidven:4) )No results found for this or any previous visit (from the past 240 hours).   Radiological Exams on Admission:   Assessment/Plan Principal Problem:   Pleural effusion Active Problems:   History of right breast cancer   Essential hypertension   Dyslipidemia associated with type 2 diabetes mellitus (HCC)   Hiatal hernia   Gastroesophageal reflux disease without esophagitis   Carotid stenosis   Diabetes mellitus without complication (HCC)   Hair loss   Assessment and Plan:  Pleural effusion: CT of chest showed large left pleural effusion consistent with pleural metastasis and small right pleural effusion, and right axillary nodal metastasis. Consulted Dr.  Yu of oncology.  -will place in tele bed for obs - Supportive care - Bronchodilators as needed Mucinex  - INR/PTT - Ordered thoracentesis by IR --> follow-up  cytology and fluid analysis  History of right breast cancer: s/p of right mastectomy and immunotherapy. - Follow-up oncologist recommendation  Essential hypertension -Atenolol , spironolactone  - IV hydralazine  as needed  Dyslipidemia associated with type 2 diabetes mellitus (HCC):  - Crestor   Hiatal hernia and Gastroesophageal reflux disease without esophagitis -Protonix   Carotid stenosis: Patient is not taking aspirin  due to acid reflux - Follow-up with PCP  Diabetes mellitus without complication [facility]): Recent A1c 6.3, well-controlled.  Patient is taking Ozempic -SSI  Hair loss - Continue minoxidil     DVT ppx: SCD  Code Status: Full code   Family Communication:    Yes, patient's husband  at bed side.      Disposition Plan:  Anticipate discharge back to previous environment  Consults called:  Dr. Babara of onology  Admission status and Level of care: Telemetry:    for obs     Dispo: The patient is from: Home              Anticipated d/c is to: Home              Anticipated d/c date is: 1 day              Patient currently is not medically stable to d/c.    Severity of Illness:  The appropriate patient status for this patient is OBSERVATION. Observation status is judged to be reasonable and necessary in order to provide the required intensity of service to ensure the patient's safety. The patient's presenting symptoms, physical exam findings, and initial radiographic and laboratory data in the context of their medical condition is felt to place them at decreased risk for further clinical deterioration. Furthermore, it is anticipated that the patient will be medically stable for discharge from the hospital within 2 midnights of admission.        Date of Service 11/13/2024    Caleb Exon [facility]   If 7PM-7AM, please contact night-coverage   [email or web address] 11/13/2024, 12:05 AM

## 2024-11-12 NOTE — ED Notes (Signed)
 CCMD called at this time to admit patient of monitoring

## 2024-11-13 ENCOUNTER — Observation Stay

## 2024-11-13 DIAGNOSIS — J90 Pleural effusion, not elsewhere classified: Secondary | ICD-10-CM | POA: Diagnosis not present

## 2024-11-13 DIAGNOSIS — Z853 Personal history of malignant neoplasm of breast: Secondary | ICD-10-CM | POA: Diagnosis not present

## 2024-11-13 DIAGNOSIS — C782 Secondary malignant neoplasm of pleura: Secondary | ICD-10-CM | POA: Diagnosis not present

## 2024-11-13 DIAGNOSIS — C801 Malignant (primary) neoplasm, unspecified: Secondary | ICD-10-CM | POA: Diagnosis not present

## 2024-11-13 DIAGNOSIS — K219 Gastro-esophageal reflux disease without esophagitis: Secondary | ICD-10-CM | POA: Diagnosis not present

## 2024-11-13 LAB — CBC
HCT: 39.3 % (ref 36.0–46.0)
Hemoglobin: 12.4 g/dL (ref 12.0–15.0)
MCH: 28.9 pg (ref 26.0–34.0)
MCHC: 31.6 g/dL (ref 30.0–36.0)
MCV: 91.6 fL (ref 80.0–100.0)
Platelets: 236 K/uL (ref 150–400)
RBC: 4.29 MIL/uL (ref 3.87–5.11)
RDW: 12.9 % (ref 11.5–15.5)
WBC: 5.5 K/uL (ref 4.0–10.5)
nRBC: 0 % (ref 0.0–0.2)

## 2024-11-13 LAB — BASIC METABOLIC PANEL WITH GFR
Anion gap: 14 (ref 5–15)
BUN: 7 mg/dL — ABNORMAL LOW (ref 8–23)
CO2: 21 mmol/L — ABNORMAL LOW (ref 22–32)
Calcium: 9.3 mg/dL (ref 8.9–10.3)
Chloride: 101 mmol/L (ref 98–111)
Creatinine, Ser: 0.68 mg/dL (ref 0.44–1.00)
GFR, Estimated: >60 mL/min (ref >60)
Glucose, Bld: 83 mg/dL (ref 70–99)
Potassium: 4.3 mmol/L (ref 3.5–5.1)
Sodium: 136 mmol/L (ref 135–145)

## 2024-11-13 LAB — ALBUMIN, PLEURAL OR PERITONEAL FLUID: Albumin, Fluid: 2.6 g/dL

## 2024-11-13 LAB — CBG MONITORING, ED: Glucose-Capillary: 85 mg/dL (ref 70–99)

## 2024-11-13 LAB — LACTATE DEHYDROGENASE, PLEURAL OR PERITONEAL FLUID: LD, Fluid: 223 U/L — ABNORMAL HIGH (ref 3–23)

## 2024-11-13 LAB — GLUCOSE, CAPILLARY
Glucose-Capillary: 136 mg/dL — ABNORMAL HIGH (ref 70–99)
Glucose-Capillary: 179 mg/dL — ABNORMAL HIGH (ref 70–99)

## 2024-11-13 MED ORDER — MINOXIDIL 2.5 MG PO TABS
2.5000 mg | ORAL_TABLET | Freq: Every day | ORAL | Status: DC
  Administered 2024-11-13 – 2024-11-14 (×2): 2.5 mg via ORAL
  Filled 2024-11-13 (×2): qty 1

## 2024-11-13 MED ORDER — BUDESONIDE 0.25 MG/2ML IN SUSP
0.2500 mg | Freq: Two times a day (BID) | RESPIRATORY_TRACT | Status: DC
  Administered 2024-11-13 – 2024-11-14 (×3): 0.25 mg via RESPIRATORY_TRACT
  Filled 2024-11-13 (×3): qty 2

## 2024-11-13 MED ORDER — ATENOLOL 25 MG PO TABS
12.5000 mg | ORAL_TABLET | Freq: Every day | ORAL | Status: DC
  Administered 2024-11-13 – 2024-11-14 (×2): 12.5 mg via ORAL
  Filled 2024-11-13 (×2): qty 1

## 2024-11-13 MED ORDER — SPIRONOLACTONE 12.5 MG HALF TABLET
12.5000 mg | ORAL_TABLET | Freq: Every day | ORAL | Status: DC
  Administered 2024-11-13 – 2024-11-14 (×2): 12.5 mg via ORAL
  Filled 2024-11-13 (×2): qty 1

## 2024-11-13 MED ORDER — ROSUVASTATIN CALCIUM 20 MG PO TABS
20.0000 mg | ORAL_TABLET | Freq: Every day | ORAL | Status: DC
  Administered 2024-11-13: 20 mg via ORAL
  Filled 2024-11-13: qty 1

## 2024-11-13 MED ORDER — AZELASTINE HCL 0.1 % NA SOLN
1.0000 | Freq: Two times a day (BID) | NASAL | Status: DC
  Administered 2024-11-13 – 2024-11-14 (×3): 1 via NASAL
  Filled 2024-11-13 (×2): qty 30

## 2024-11-13 MED ORDER — ALBUTEROL-BUDESONIDE 90-80 MCG/ACT IN AERO: 2.0000 | INHALATION_SPRAY | Freq: Four times a day (QID) | RESPIRATORY_TRACT | Status: DC

## 2024-11-13 MED ORDER — ADULT MULTIVITAMIN W/MINERALS CH: 1.0000 | ORAL_TABLET | ORAL | Status: DC

## 2024-11-13 MED ORDER — LIDOCAINE HCL (PF) 1 % IJ SOLN
10.0000 mL | Freq: Once | INTRAMUSCULAR | Status: AC
  Administered 2024-11-13: 10 mL via INTRADERMAL

## 2024-11-13 MED ORDER — PANTOPRAZOLE SODIUM 40 MG PO TBEC
40.0000 mg | DELAYED_RELEASE_TABLET | Freq: Every day | ORAL | Status: DC
  Administered 2024-11-13 – 2024-11-14 (×2): 40 mg via ORAL
  Filled 2024-11-13 (×2): qty 1

## 2024-11-13 NOTE — Hospital Course (Signed)
 HPI: Morgan Peterson is a 73 y.o. female with medical history significant of breast cancer 2023 (s/p of right mastectomy and immunotherapy), HTN, HLD, DM, asthma, GERD, hiatal hernia, IBS, neuropathy, carotid artery stenosis, C-6 radiculopathy, who presents with and SOB.   Pt states that she has dry cough for almost 3 weeks and SOB in the past several days.  No chest pain, fever or chills.  She was seen in urgent care and treated with 7-day course of amoxicillin  without significant improvement.  Due to worsening shortness of breath, she was seen in [facility] today, where she had chest x-ray which showed left pleural effusion and hiatal hernia.  Patient was sent to ED for further evaluation and treatment.  Patient does not have nausea, vomiting, diarrhea or abdominal pain.  No symptoms UTI.     Data reviewed independently and ED Course: pt was found to have WBC 5.5, GFR > 60, temperature normal, blood pressure 147/98, heart rate 91, RR 20, oxygen sat 99% on room air.  Patient is placed in telemetry bed for observation.  Dr. Babara of oncology is consulted. CT chest showed large left pleural effusion with pleural nodularity consistent with pleural metastasis.  Tiny right pleural effusion with findings suspicious for right pleural and pulmonary parenchymal metastasis including nodularity along with right major fissure and scattered right lung nodules up to 4 mm.  Right axillary nodal metastasis.  Large hiatal hernia.

## 2024-11-13 NOTE — ED Notes (Signed)
Patient ambulatory to bathroom and back to room with steady gait.

## 2024-11-13 NOTE — ED Notes (Signed)
 Informed pt that she has an assigned inpatient room that is a shared room, pt states that she does not want a shared room, states she prefers a private room and is wanting to wait until one becomes available. Charge RN made aware.

## 2024-11-13 NOTE — ED Notes (Signed)
 Pt back to unit from ultrasound. Per tech 1.5L removed during thoracentesis.

## 2024-11-13 NOTE — Procedures (Signed)
 PROCEDURE SUMMARY:  Successful image-guided diagnostic and therapeutic thoracentesis from the left chest.  Yielded 1.2 liters of bloody fluid.  No immediate complications.  EBL: <1 mL Patient tolerated well.   Specimen sent for labs.  Post-procedure CXR ordered.   Please see imaging section of Epic for full dictation.  Jaonna Word B Enoch Moffa NP 11/13/2024 1:27 PM

## 2024-11-13 NOTE — TOC CM/SW Note (Signed)
 Transition of Care [facility]) - Inpatient Brief Assessment   Patient Details  Name: Morgan Peterson MRN: [id number] Date of Birth: August 06, 1951  Transition of Care [facility]) CM/SW Contact:    Daved JONETTA Hamilton, RN Phone Number: 11/13/2024, 2:14 PM   Clinical Narrative:   Transition of Care [facility]) Screening Note   Patient Details  Name: Morgan Peterson Date of Birth: 08/27/1951   Transition of Care [facility]) CM/SW Contact:    Daved JONETTA Hamilton, RN Phone Number: 11/13/2024, 2:14 PM    Transition of Care [facility]) has reviewed patient and no TOC needs have been identified at this time.  If new patient transition needs arise, please place a TOC consult.    Transition of Care Asessment: Insurance and Status: Insurance coverage has been reviewed Patient has primary care physician: Yes   Prior level of function:: independent Prior/Current Home Services: No current home services Social Drivers of Health Review: SDOH reviewed no interventions necessary Readmission risk has been reviewed: No (patient is in observation status, no score generated) Transition of care needs: no transition of care needs at this time

## 2024-11-13 NOTE — Plan of Care (Signed)
   Problem: Education: Goal: Individualized Educational Video(s) Outcome: Progressing   Problem: Coping: Goal: Ability to adjust to condition or change in health will improve Outcome: Progressing

## 2024-11-13 NOTE — Consult Note (Signed)
 Hematology/Oncology Consult note [phone] [phone]      Patient Care Team: Sowles, [name], MD as PCP - General (Family Medicine) Dessa, Reyes ORN, MD (General Surgery) [name], MD as Referring Physician (Otolaryngology) Florencio Cara BIRCH, MD as Consulting Physician (Cardiology) Cathlyn Seal, MD as Referring Physician (Dermatology) Cherilyn Debby CROME, MD as Consulting Physician (Internal Medicine) Rennie Cindy SAUNDERS, MD as Consulting Physician (Internal Medicine) Tye Millet, DO as Consulting Physician (General Surgery) [facility]   Name of the patient: Morgan Peterson  [id number]  [birth year]   REASON FOR COSULTATION:  History of breast cancer.  Pleural effusion. History of presenting illness-  73 y.o. female with PMH listed at below who presents to ER for evaluation of shortness of breath. Patient reports a history of dry cough for the past 3 weeks.  Initially treated for upper respiratory infection with a course of antibiotics.  Shortness of breath did not improve. She had outpatient x-ray which showed progression and was patient was sent to emergency room.  Patient denies hemoptysis, unintentional weight loss, headache, vision changes focal weakness.  11/12/2024, CT chest with contrast showed 1. Large left pleural effusion with pleural nodularity, consistent with pleural metastasis. 2. Tiny right pleural effusion with findings suspicious for right pleural and pulmonary parenchymal metastases including nodularity along the right major fissure and scattered right lung nodules up to 4 mm. 3. Right axillary nodal metastasis in the setting of prior right mastectomy. 4. Large hiatal hernia with approximately half of the stomach positioned in the lower chest.  Patient was admitted for management of left large pleural effusion.  Status post therapeutic and diagnostic thoracentesis today.  1.2 L of pleural fluid  was removed  Oncology was consulted for further management. Patient reports feeling better after thoracentesis.  Husband is at bedside.  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  She follows up with Dr. Rennie.  Allergies  Allergen Reactions   Levaquin [Levofloxacin In D5w] Swelling    Other reaction(s): Arthralgia (Joint Pain)   Losartan     Other reaction(s): Angioedema   Metoprolol Swelling    Lip and tongue tingling and numbness   Saxagliptin Other (See Comments)    Increased PVC's  Other reaction(s): irregular heart rate   Amlodipine-Olmesartan    Canagliflozin Other (See Comments)    Bladder pain   Egg White [Egg Protein (Egg White)]     Unknown reaction   Gabapentin      cns side effects.   Glipizide     Other reaction(s): Abdominal pain   Gluten Meal    Lisinopril Swelling    Angioedema   Metformin  And Related Nausea Only   Milk (Cow)    Milk-Related Compounds    Shrimp Extract     Other reaction(s): Other (See Comments)   Tramadol Nausea And Vomiting   Zyprexa  [Olanzapine ]    Actos [Pioglitazone] Other (See Comments) and Nausea And Vomiting    Bladder pain   Atorvastatin  Other (See Comments)    Other reaction(s): Abdominal Pain, Other (See Comments)   Carvedilol  Other (See Comments)    NUMBNESS, TINGLING, ACHING IN ARMS   Prednisone  Palpitations    Patient Active Problem List   Diagnosis Date Noted   Pleural effusion 11/12/2024   Diabetes mellitus without complication (HCC) 11/12/2024   Hair loss 11/12/2024   Osteopenia after menopause 09/13/2024   Perennial allergic rhinitis with seasonal variation  09/13/2024   Arrhythmia 03/13/2024   Gastroesophageal reflux disease without esophagitis 03/13/2024   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   Bilateral lower extremity edema 08/27/2022   Hives 08/27/2022   Food allergy  08/05/2022   Atherosclerosis of  both carotid arteries 08/05/2022   Migraine aura without headache 08/05/2022   Carcinoma of upper-outer quadrant of right breast in female, estrogen receptor negative (HCC) 05/26/2022   Breast cancer (HCC) 05/10/2022   Goals of care, counseling/discussion 05/10/2022   Encounter for monitoring cardiotoxic drug therapy 05/10/2022   Carotid stenosis 04/21/2022   Hypertension associated with type 2 diabetes mellitus (HCC) 01/20/2021   Intermittent low back pain 08/18/2017   Irritable bowel syndrome with constipation 05/02/2017   Vitamin D  deficiency 09/09/2016   Hiatal hernia 06/02/2016   Allergic rhinitis, seasonal 06/02/2016   Angio-edema 04/30/2016   Cervical radiculopathy at C6 07/31/2015   Neuroma digital nerve 07/31/2015   Dyslipidemia associated with type 2 diabetes mellitus (HCC) 06/30/2015   Cervical disc disease 06/30/2015   Essential hypertension 06/30/2015   History of right breast cancer 03/23/2009     Past Medical History:  Diagnosis Date   Appetite loss    Asthma    Cervical radiculopathy    Chemotherapy-induced nausea    Hiatal hernia    Hypercholesterolemia    Hyperlipidemia    Hypertension    IBS (irritable bowel syndrome)    Malignant neoplasm of upper-outer quadrant of female breast (HCC) 04/11/2009   Right breast, invasive ductal carcinoma, 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 Arimidex therapy completed September 2015   Mild mitral valve prolapse    Neuropathy    feet, from chemo   Personal history of malignant neoplasm of breast 2010   Personal history of radiation therapy 2010   mammosite   T2DM (type 2 diabetes mellitus) (HCC) 2011     Past Surgical History:  Procedure Laterality Date   ABDOMINAL EXPLORATION SURGERY  2000   ovarian cyst   BREAST BIOPSY Right 2010   +    BREAST BIOPSY Left 04/06/2023   Left Breast Stereo Bx X Clip - path pending   BREAST BIOPSY Left 04/06/2023   Left  Breast Stereo Bx, Coil Clip - path pending   BREAST BIOPSY Left 04/06/2023   MM LT BREAST BX W LOC DEV 1ST LESION IMAGE BX SPEC STEREO GUIDE 04/06/2023 [facility]   BREAST BIOPSY Left 04/06/2023   MM LT BREAST BX W LOC DEV EA AD LESION IMG BX SPEC STEREO GUIDE 04/06/2023 [facility]   BREAST EXCISIONAL BIOPSY Right 2010   + mammo site inasive mammo ca   BREAST LUMPECTOMY Right 2010   [facility]   COLONOSCOPY  07/19/2012   Normal exam, Dr. Dessa   COLONOSCOPY WITH PROPOFOL  N/A 04/21/2022   Procedure: COLONOSCOPY WITH PROPOFOL ;  Surgeon: Dessa Reyes ORN, MD;  Location: [facility];  Service: Endoscopy;  Laterality: N/A;   ESOPHAGOGASTRODUODENOSCOPY (EGD) WITH PROPOFOL  N/A 10/22/2022   Procedure: ESOPHAGOGASTRODUODENOSCOPY (EGD) WITH PROPOFOL ;  Surgeon: Jinny Carmine, MD;  Location: [facility];  Service: Endoscopy;  Laterality: N/A;  Diabetic   MASTECTOMY Right 2010   partial/ lumpectomy   PORTACATH PLACEMENT Left 05/19/2022   Procedure: INSERTION PORT-A-CATH;  Surgeon: Dessa Reyes ORN, MD;  Location: [facility];  Service: General;  Laterality: Left;   SIMPLE MASTECTOMY WITH AXILLARY SENTINEL NODE BIOPSY Right 04/26/2022   Procedure: SIMPLE MASTECTOMY WITH AXILLARY SENTINEL NODE BIOPSY;  Surgeon: Dessa Reyes ORN, MD;  Location: [facility];  Service: General;  Laterality: Right;  RNFA to assist   TUBAL LIGATION  20 years ago    Social History   Socioeconomic History   Marital status: Married    Spouse name: Antonio   Number of children: 2   Years of education: Not on file   Highest education level: Not on file  Occupational History   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;  Husband retired from [employer].  No smoking.   Social Drivers of Corporate Investment Banker Strain: Low Risk  (04/12/2024)   Overall Financial Resource Strain (CARDIA)    Difficulty of Paying Living Expenses: Not hard at all  Food Insecurity: No Food Insecurity (11/13/2024)   Hunger Vital Sign    Worried About Running Out of Food in the Last Year: Never true    Ran Out of Food in the Last Year: Never true  Transportation Needs: No Transportation Needs (11/13/2024)   PRAPARE - Administrator, Civil Service (Medical): No    Lack of Transportation (Non-Medical): No  Physical Activity: Sufficiently Active (04/12/2024)   Exercise Vital Sign    Days of Exercise per Week: 3 days    Minutes of Exercise per Session: 60 min  Stress: No Stress Concern Present (04/12/2024)   [employer] of Occupational Health - Occupational Stress Questionnaire    Feeling of Stress : Not at all  Social Connections: Unknown (11/13/2024)   Social Connection and Isolation Panel    Frequency of Communication with Friends and Family: More than three times a week    Frequency of Social Gatherings with Friends and Family: Twice a week    Attends Religious Services: Not on Marketing Executive or Organizations: No    Attends Banker Meetings: Never    Marital Status: Married  Catering Manager Violence: Not At Risk (11/13/2024)   Humiliation, Afraid, Rape, and Kick questionnaire    Fear of Current or Ex-Partner: No    Emotionally Abused: No    Physically Abused: No    Sexually Abused: No     Family History  Problem Relation Age of Onset   Osteoporosis  Mother    Diabetes Father    Kidney disease Father    Diabetes Brother    Breast cancer Maternal Aunt    Bladder Cancer Maternal Aunt    Cervical cancer Maternal Aunt    Colon cancer Maternal Uncle    Lung cancer Maternal Uncle      Current Facility-Administered Medications:    acetaminophen  (TYLENOL ) tablet 650 mg, 650 mg, Oral, Q6H PRN, Niu, Xilin, MD, 650 mg at 11/13/24 2118   albuterol  (PROVENTIL ) (2.5  MG/3ML) 0.083% nebulizer solution 2.5 mg, 2.5 mg, Inhalation, Q4H PRN, Niu, Xilin, MD   atenolol  (TENORMIN ) tablet 12.5 mg, 12.5 mg, Oral, Daily, Niu, Xilin, MD, 12.5 mg at 11/13/24 9041   azelastine  (ASTELIN ) 0.1 % nasal spray 1 spray, 1 spray, Each Nare, BID, Niu, Xilin, MD, 1 spray at 11/13/24 2112   budesonide  (PULMICORT ) nebulizer solution 0.25 mg, 0.25 mg, Nebulization, BID, Belue, Nathan S, RPH, 0.25 mg at 11/13/24 2023   dextromethorphan-guaiFENesin  (MUCINEX  DM) 30-600 MG per 12 hr tablet 1 tablet, 1 tablet, Oral, BID PRN, Niu, Xilin, MD   hydrALAZINE  (APRESOLINE ) injection 5 mg, 5 mg, Intravenous, Q2H PRN, Niu, Xilin, MD   insulin  aspart (novoLOG ) injection 0-5 Units, 0-5 Units, Subcutaneous, QHS, Niu, Xilin, MD   insulin  aspart (novoLOG ) injection 0-9 Units, 0-9 Units, Subcutaneous, TID WC, Niu, Xilin, MD   loratadine  (CLARITIN ) tablet 10 mg, 10 mg, Oral, Daily, Niu, Xilin, MD, 10 mg at 11/13/24 9041   minoxidil  (LONITEN ) tablet 2.5 mg, 2.5 mg, Oral, Daily, Niu, Xilin, MD, 2.5 mg at 11/13/24 9042   montelukast  (SINGULAIR ) tablet 10 mg, 10 mg, Oral, QHS, Niu, Xilin, MD, 10 mg at 11/13/24 2112   [START ON 11/15/2024] multivitamin with minerals tablet 1 tablet, 1 tablet, Oral, Once per day on Monday Thursday, Hilma, Xilin, MD   ondansetron  (ZOFRAN ) injection 4 mg, 4 mg, Intravenous, Q8H PRN, Niu, Xilin, MD   pantoprazole  (PROTONIX ) EC tablet 40 mg, 40 mg, Oral, Daily, Niu, Xilin, MD, 40 mg at 11/13/24 9042   rosuvastatin  (CRESTOR ) tablet 20 mg, 20 mg, Oral, QHS, Niu, Xilin, MD, 20  mg at 11/13/24 2111   spironolactone  (ALDACTONE ) tablet 12.5 mg, 12.5 mg, Oral, Daily, Niu, Xilin, MD, 12.5 mg at 11/13/24 1000  Facility-Administered Medications Ordered in Other Encounters:    heparin  lock flush 100 UNIT/ML injection, , , ,    heparin  lock flush 100 unit/mL, 500 Units, Intravenous, Once, Brahmanday, Govinda R, MD  Review of Systems  Constitutional:  Negative for appetite change, chills, fatigue, fever and unexpected weight change.  HENT:   Negative for hearing loss and voice change.   Eyes:  Negative for eye problems.  Respiratory:  Positive for shortness of breath. Negative for chest tightness, cough and hemoptysis.   Cardiovascular:  Negative for chest pain.  Gastrointestinal:  Negative for abdominal distention, abdominal pain and blood in stool.  Endocrine: Negative for hot flashes.  Genitourinary:  Negative for difficulty urinating and frequency.   Musculoskeletal:  Negative for arthralgias.  Skin:  Negative for itching and rash.  Neurological:  Negative for extremity weakness.  Hematological:  Negative for adenopathy.  Psychiatric/Behavioral:  Negative for confusion.     PHYSICAL EXAM Vitals:   11/13/24 1100 11/13/24 1335 11/13/24 1936 11/13/24 2023  BP: 104/73 111/71 117/68   Pulse:  81 91   Resp:  19 17   Temp:  98.5 F (36.9 C) 97.6 F (36.4 C)   TempSrc:  Oral    SpO2:  100% 97% 97%  Weight:      Height:       Physical Exam Constitutional:      General: She is not in acute distress.    Appearance: She is not diaphoretic.  HENT:     Head: Normocephalic and atraumatic.  Eyes:     General: No scleral icterus. Cardiovascular:     Rate and Rhythm: Normal rate and regular rhythm.  Pulmonary:     Effort: Pulmonary effort is normal. No respiratory distress.  Breath sounds: No wheezing.     Comments: Decreased breath on the left side Abdominal:     General: There is no distension.     Palpations: Abdomen is soft.     Tenderness: There is no  abdominal tenderness.  Musculoskeletal:        General: Normal range of motion.     Cervical back: Normal range of motion and neck supple.  Skin:    General: Skin is warm and dry.     Findings: No erythema.  Neurological:     Mental Status: She is alert and oriented to person, place, and time. Mental status is at baseline.     Motor: No abnormal muscle tone.  Psychiatric:        Mood and Affect: Mood and affect normal.       LABORATORY STUDIES    Latest Ref Rng & Units 11/13/2024    4:32 AM 11/12/2024    3:39 PM 09/18/2024    1:08 PM  CBC  WBC 4.0 - 10.5 K/uL 5.5  5.5  4.3   Hemoglobin 12.0 - 15.0 g/dL 87.5  87.0  87.9   Hematocrit 36.0 - 46.0 % 39.3  40.8  37.6   Platelets 150 - 400 K/uL 236  275  207       Latest Ref Rng & Units 11/13/2024    4:32 AM 11/12/2024    3:39 PM 09/18/2024    1:08 PM  CMP  Glucose 70 - 99 mg/dL 83  883  899   BUN 8 - 23 mg/dL 7  6  15    Creatinine 0.44 - 1.00 mg/dL 9.31  9.16  9.08   Sodium 135 - 145 mmol/L 136  139  137   Potassium 3.5 - 5.1 mmol/L 4.3  4.0  4.2   Chloride 98 - 111 mmol/L 101  101  105   CO2 22 - 32 mmol/L 21  25  24    Calcium  8.9 - 10.3 mg/dL 9.3  9.6  9.0   Total Protein 6.5 - 8.1 g/dL  7.3  7.0   Total Bilirubin 0.0 - 1.2 mg/dL  0.4  0.8   Alkaline Phos 38 - 126 U/L  95  85   AST 15 - 41 U/L  22  17   ALT 0 - 44 U/L  8  11      RADIOGRAPHIC STUDIES: I have personally reviewed the radiological images as listed and agreed with the findings in the report. US  THORACENTESIS ASP PLEURAL SPACE W/IMG GUIDE Result Date: 11/13/2024 INDICATION: 73 year old female with history of breast cancer and presents with shortness of breath. Received request for diagnostic and therapeutic thoracentesis. EXAM: ULTRASOUND GUIDED DIAGNOSTIC AND THERAPEUTIC, LEFT-SIDED THORACENTESIS MEDICATIONS: 13 mL 1% lidocaine  COMPLICATIONS: None immediate. PROCEDURE: An ultrasound guided thoracentesis was thoroughly discussed with the patient and  questions answered. The benefits, risks, alternatives and complications were also discussed. The patient understands and wishes to proceed with the procedure. Written consent was obtained. Ultrasound was performed to localize and mark an adequate pocket of fluid in the left chest. The area was then prepped and draped in the normal sterile fashion. 1% lidocaine  was used for local anesthesia. Under ultrasound guidance a 6 Fr Safe-T-Centesis catheter was introduced. Thoracentesis was performed. The catheter was removed and a dressing applied. FINDINGS: A total of approximately 1.2 liters of bloody fluid was removed. Samples were sent to the laboratory as requested by the clinical team. IMPRESSION: Successful ultrasound guided left  thoracentesis yielding 1.2 liters of pleural fluid. Performed by: Kristi Davenport, NP under the direct supervision of Dr. Juliene Balder Electronically Signed   By: Juliene Balder M.D.   On: 11/13/2024 15:46   DG Chest 1 View Result Date: 11/13/2024 CLINICAL DATA:  Status post left thoracentesis EXAM: CHEST  1 VIEW COMPARISON:  Chest radiograph dated 11/12/2024 FINDINGS: Normal lung volumes. Similar opacification of the left lower lung. Large rounded lucency projecting over the lower mediastinum. Decreased large left pleural effusion. No pneumothorax. Heart borders are obscured. No acute osseous abnormality. IMPRESSION: 1. Decreased large left pleural effusion. No pneumothorax. 2. Similar opacification of the left lower lung. 3. Large rounded lucency projecting over the lower mediastinum in keeping with large hiatal hernia. Electronically Signed   By: Limin  Xu M.D.   On: 11/13/2024 14:29   CT Chest W Contrast Result Date: 11/12/2024 EXAM: CT CHEST WITH CONTRAST 11/12/2024 06:03:21 PM TECHNIQUE: CT of the chest was performed with the administration of 75 mL of iohexol  (OMNIPAQUE ) 300 MG/ML solution. Multiplanar reformatted images are provided for review. Automated exposure control, iterative  reconstruction, and/or weight based adjustment of the mA/kV was utilized to reduce the radiation dose to as low as reasonably achievable. COMPARISON: Chest radiograph of 11/12/2024. No prior chest CT. CLINICAL HISTORY: Pleural effusion, malignancy suspected. Prior right sided lumpectomy for breast cancer. * Tracking Code: BO * FINDINGS: MEDIASTINUM: Heart and pericardium are unremarkable. Normal heart size without pericardial effusion. The central airways are clear. Mediastinal shift to the right secondary to the large left pleural effusion. Aortic atherosclerosis (ICD10-I70.0). No central pulmonary embolism on this non-dedicated exam. LYMPH NODES: No supraclavicular adenopathy. Right axillary adenopathy, specifically a nodal mass of 1.5 x 2.0 cm on image 32 / 2. No middle mediastinal or hilar adenopathy. Prominent prevascular nodes including up to 6 mm on image 39 / 2 are indeterminate. LUNGS AND PLEURA: Large left pleural effusion. Dependent irregularity of moderate left pleural thickening including on image 82 / 2. More nodular pleural thickening, and irregularity superiorly including up to 8 mm on image 20 / 2. The majority of the left lung is compressed. Minimal aerated left apex. Tiny right pleural effusion. Nodularity along the right major fissure including on image 47 / 3 is suspicious. There are also subtle parenchymal nodules throughout the right lung at maximally 4 mm on image 83 / 3. SOFT TISSUES/BONES: Osteopenia. Right mastectomy. UPPER ABDOMEN: Large hiatal hernia, with approximately half of the stomach positioned in the lower chest. Organoaxial position / volvulus without complicating obstruction. Possible hepatic steatosis. Normal adrenal glands. IMPRESSION: 1. Large left pleural effusion with pleural nodularity, consistent with pleural metastasis. 2. Tiny right pleural effusion with findings suspicious for right pleural and pulmonary parenchymal metastases including nodularity along the right major  fissure and scattered right lung nodules up to 4 mm. 3. Right axillary nodal metastasis in the setting of prior right mastectomy. 4. Large hiatal hernia with approximately half of the stomach positioned in the lower chest. Electronically signed by: Rockey Kilts MD 11/12/2024 06:36 PM EST RP Workstation: [id number]   DG Chest 2 View Result Date: 11/12/2024 CLINICAL DATA:  Shortness of breath EXAM: CHEST - 2 VIEW COMPARISON:  05/19/2022 FINDINGS: Large left-sided pleural effusion with mild shift of mediastinal contents to the right. Only small amount of aerated lung at left apex. Right lung grossly clear. Large air containing structure over the mediastinal silhouette most likely represents hiatal hernia. IMPRESSION: 1. Large left-sided pleural effusion with mild shift of mediastinal  contents to the right. 2. Large air containing structure over the mediastinal silhouette most likely represents hiatal hernia. Electronically Signed   By: Luke Bun M.D.   On: 11/12/2024 16:41     Assessment and plan-   # Bilateral pleural effusion, left more than right. Left side pleural nodularity, nodularity of right major fissure and scattered lung nodules.  Right axillary lymphadenopathy 2 cm. History of triple negative breast cancer CT findings were highly suspicious for breast cancer recurrence. Agree with therapeutic and diagnostic thoracentesis. Follow-up fluid analysis.  Cytology is pending. Continue supportive care. She will follow-up outpatient with Dr. Rennie to discuss results. If cytology is not conclusive, consider outpatient right axillary lymph node biopsy.  Recommend patient staging with PET scan.  Large hiatal hernia, GERD, continue PPI.  If patient's respiratory status remains stable tomorrow, okay from oncology aspect to discharge home with follow-up outpatient at the cancer center to discuss results.  Thank you for allowing me to participate in the care of this patient.   Zelphia Cap, MD,  PhD Hematology Oncology 11/13/2024

## 2024-11-13 NOTE — ED Notes (Signed)
 Assumed care of pt from [facility]. Introduced self to pt. Pt has call bell in reach. Pt has no further needs at this time.

## 2024-11-13 NOTE — Progress Notes (Signed)
 Progress Note   Patient: Morgan Peterson FMW:[id number] DOB: 1951-02-11 DOA: 11/12/2024     0 DOS: the patient was seen and examined on 11/13/2024   Brief hospital course: HPI: Morgan Peterson is a 73 y.o. female with medical history significant of breast cancer 2023 (s/p of right mastectomy and immunotherapy), HTN, HLD, DM, asthma, GERD, hiatal hernia, IBS, neuropathy, carotid artery stenosis, C-6 radiculopathy, who presents with and SOB.   Pt states that she has dry cough for almost 3 weeks and SOB in the past several days.  No chest pain, fever or chills.  She was seen in urgent care and treated with 7-day course of amoxicillin  without significant improvement.  Due to worsening shortness of breath, she was seen in [facility] today, where she had chest x-ray which showed left pleural effusion and hiatal hernia.  Patient was sent to ED for further evaluation and treatment.  Patient does not have nausea, vomiting, diarrhea or abdominal pain.  No symptoms UTI.     Data reviewed independently and ED Course: pt was found to have WBC 5.5, GFR > 60, temperature normal, blood pressure 147/98, heart rate 91, RR 20, oxygen sat 99% on room air.  Patient is placed in telemetry bed for observation.  Dr. Babara of oncology is consulted. CT chest showed large left pleural effusion with pleural nodularity consistent with pleural metastasis.  Tiny right pleural effusion with findings suspicious for right pleural and pulmonary parenchymal metastasis including nodularity along with right major fissure and scattered right lung nodules up to 4 mm.  Right axillary nodal metastasis.  Large hiatal hernia.  Assessment and Plan:  B/L pleural effusion left greater than right:  CT of chest showed large left pleural effusion consistent with pleural metastasis and small right pleural effusion, and right axillary nodal metastasis.  Consulted Dr. Babara of oncology.  -S/p IR drainage of the left pleural effusion 11/25. - Follow-up fluid  analysis  History of right breast cancer: S/p of right mastectomy and immunotherapy. - Follow-up oncologist recommendation   Essential hypertension -Atenolol , spironolactone  - IV hydralazine  as needed   Hyperlipidemia - Crestor  20 mg at bedtime    Hiatal hernia and Gastroesophageal reflux disease without esophagitis -Protonix  40 mg p.o. daily   Carotid stenosis: Patient is not taking aspirin  due to acid reflux - Follow-up with PCP   Diabetes mellitus without complication (HCC):  Recent A1c 6.3, well-controlled.   Patient is taking Ozempic -SSI   Hair loss - Continue minoxidil       Subjective: Denies any fever chills nausea vomiting or chest pain.  She complains of mild shortness of breath  Physical Exam: Vitals:   11/13/24 0957 11/13/24 1027 11/13/24 1100 11/13/24 1335  BP: 132/82 130/88 104/73 111/71  Pulse: (!) 105 (!) 102  81  Resp:  (!) 27  19  Temp:  98.9 F (37.2 C)  98.5 F (36.9 C)  TempSrc:  Oral  Oral  SpO2:  100%  100%  Weight:      Height:       General: Not in acute distress HEENT: No JVD Respiratory: Bibasilar decreased air entry Cardiovascular: Regular S1 and S2 GI: Soft nondistended bowel sounds present Extremities: No edema Musculoskeletal: No effusions full ROM Neurological: Alert awake and oriented, no focal logical deficit identified   Data Reviewed:  Reviewed  Family Communication: Family was at bedside  Disposition: Status is: Observation The patient remains OBS appropriate and will d/c before 2 midnights.  Planned Discharge Destination: Home  Time spent: 35 minutes  Author: Briyonna Omara, MD 11/13/2024 3:36 PM  For on call review [email or web address].

## 2024-11-14 DIAGNOSIS — J90 Pleural effusion, not elsewhere classified: Secondary | ICD-10-CM | POA: Diagnosis not present

## 2024-11-14 LAB — GLUCOSE, PLEURAL OR PERITONEAL FLUID: Glucose, Fluid: 85 mg/dL

## 2024-11-14 LAB — GLUCOSE, CAPILLARY
Glucose-Capillary: 136 mg/dL — ABNORMAL HIGH (ref 70–99)
Glucose-Capillary: 83 mg/dL (ref 70–99)

## 2024-11-14 LAB — PROTEIN, PLEURAL OR PERITONEAL FLUID: Total protein, fluid: 4.2 g/dL

## 2024-11-14 NOTE — Discharge Summary (Signed)
 Physician Discharge Summary   Patient: Morgan Peterson MRN: [id number] DOB: 07/09/1951  Admit date:     11/12/2024  Discharge date: 11/14/24  Discharge Physician: Nena Rebel   PCP: Sowles, [name], MD   Recommendations at discharge:   Bilateral pleural effusion left more than the right  Discharge Diagnoses: Principal Problem:   Pleural effusion Active Problems:   History of right breast cancer   Essential hypertension   Dyslipidemia associated with type 2 diabetes mellitus (HCC)   Hiatal hernia   Gastroesophageal reflux disease without esophagitis   Carotid stenosis   Diabetes mellitus without complication (HCC)   Hair loss  Resolved Problems:   * No resolved hospital problems. *  Hospital Course: HPI: Morgan Peterson is a 73 y.o. female with medical history significant of breast cancer 2023 (s/p of right mastectomy and immunotherapy), HTN, HLD, DM, asthma, GERD, hiatal hernia, IBS, neuropathy, carotid artery stenosis, C-6 radiculopathy, who presents with and SOB.   Pt states that she has dry cough for almost 3 weeks and SOB in the past several days.  No chest pain, fever or chills.  She was seen in urgent care and treated with 7-day course of amoxicillin  without significant improvement.  Due to worsening shortness of breath, she was seen in [facility] today, where she had chest x-ray which showed left pleural effusion and hiatal hernia.  Patient was sent to ED for further evaluation and treatment.  Patient does not have nausea, vomiting, diarrhea or abdominal pain.  No symptoms UTI.     Data reviewed independently and ED Course: pt was found to have WBC 5.5, GFR > 60, temperature normal, blood pressure 147/98, heart rate 91, RR 20, oxygen sat 99% on room air.  Patient is placed in telemetry bed for observation.  Dr. Babara of oncology is consulted. CT chest showed large left pleural effusion with pleural nodularity consistent with pleural metastasis.  Tiny right pleural effusion with  findings suspicious for right pleural and pulmonary parenchymal metastasis including nodularity along with right major fissure and scattered right lung nodules up to 4 mm.  Right axillary nodal metastasis.  Large hiatal hernia.  Assessment and Plan: B/L pleural effusion left greater than right:  CT of chest showed large left pleural effusion consistent with pleural metastasis and small right pleural effusion, and right axillary nodal metastasis.  Consulted Dr. Babara of oncology.  -S/p IR drainage of the left pleural effusion 11/25. - Follow-up fluid analysis - Oncologist Dr. Babara seen the patient and advised for outpatient follow-up with Dr. Amey   History of right breast cancer: S/p of right mastectomy and immunotherapy. - Follow-up oncologist as outpatient per oncologist recommendation   Essential hypertension -Atenolol , spironolactone  - Continue to monitor blood pressure   Hyperlipidemia - Crestor  20 mg at bedtime    Hiatal hernia and Gastroesophageal reflux disease without esophagitis -Protonix  40 mg p.o. daily   Carotid stenosis: Patient is not taking aspirin  due to acid reflux - Follow-up with PCP   Diabetes mellitus without complication (HCC):  Recent A1c 6.3, well-controlled.   Resume home regimen  Hair loss - Resume home minoxidil         Consultants: Oncologist Dr. Babara Procedures performed: Thoracentesis Disposition: Home Diet recommendation:  Regular diet DISCHARGE MEDICATION: Allergies as of 11/14/2024       Reactions   Levaquin [levofloxacin In D5w] Swelling   Other reaction(s): Arthralgia (Joint Pain)   Losartan    Other reaction(s): Angioedema   Metoprolol Swelling   Lip  and tongue tingling and numbness   Saxagliptin Other (See Comments)   Increased PVC's Other reaction(s): irregular heart rate   Amlodipine-olmesartan    Canagliflozin Other (See Comments)   Bladder pain   Egg White [egg Protein (egg White)]    Unknown reaction   Gabapentin      cns side effects.   Glipizide    Other reaction(s): Abdominal pain   Gluten Meal    Lisinopril Swelling   Angioedema   Metformin  And Related Nausea Only   Milk (cow)    Milk-related Compounds    Shrimp Extract    Other reaction(s): Other (See Comments)   Tramadol Nausea And Vomiting   Zyprexa  [olanzapine ]    Actos [pioglitazone] Other (See Comments), Nausea And Vomiting   Bladder pain   Atorvastatin  Other (See Comments)   Other reaction(s): Abdominal Pain, Other (See Comments)   Carvedilol  Other (See Comments)   NUMBNESS, TINGLING, ACHING IN ARMS   Prednisone  Palpitations        Medication List     TAKE these medications    Airsupra  90-80 MCG/ACT Aero Generic drug: Albuterol -Budesonide  Inhale 2 puffs into the lungs 4 (four) times daily.   aspirin  EC 81 MG tablet Take 1 tablet (81 mg total) by mouth daily.   atenolol  25 MG tablet Commonly known as: TENORMIN  Take 12.5 mg by mouth daily.   Azelastine  HCl 137 MCG/SPRAY Soln PLACE 1 SPRAY INTO BOTH NOSTRILS 2 (TWO) TIMES DAILY. USE IN EACH NOSTRIL AS DIRECTED   betamethasone (augmented) 0.05 % lotion Commonly known as: DIPROLENE Apply 1 application  topically 2 (two) times daily.   betamethasone dipropionate 0.05 % lotion Apply 1 Application topically daily.   EPINEPHrine  0.3 mg/0.3 mL Soaj injection Commonly known as: EpiPen  2-Pak Inject 0.3 mg into the muscle as needed for anaphylaxis.   fexofenadine 180 MG tablet Commonly known as: ALLEGRA Take 180 mg by mouth daily.   glucose blood test strip Commonly known as: OneTouch Verio Use as instructed   ketoconazole 2 % shampoo Commonly known as: NIZORAL Apply 1 Application topically 3 (three) times a week.   levocetirizine 5 MG tablet Commonly known as: XYZAL  TAKE 1 TABLET (5 MG TOTAL) BY MOUTH DAILY.   minoxidil  2.5 MG tablet Commonly known as: LONITEN  Take 2.5 mg by mouth daily.   montelukast  10 MG tablet Commonly known as: SINGULAIR  Take 1  tablet (10 mg total) by mouth at bedtime.   multivitamin with minerals Tabs tablet Take 1 tablet by mouth 2 (two) times a week.   omeprazole  20 MG capsule Commonly known as: PRILOSEC Take 1 capsule (20 mg total) by mouth daily.   onetouch ultrasoft lancets Use as instructed   OneTouch Verio w/Device Kit 1 Device by Does not apply route once.   Ozempic (0.25 or 0.5 MG/DOSE) 2 MG/3ML Sopn Generic drug: Semaglutide(0.25 or 0.5MG /DOS) Inject 0.5 mg into the skin every 7 (seven) days.   Ozempic (0.25 or 0.5 MG/DOSE) 2 MG/1.5ML Sopn Generic drug: Semaglutide(0.25 or 0.5MG /DOS) Inject 0.5 mg into the skin every Sunday.   polyethylene glycol 17 g packet Commonly known as: MIRALAX  / GLYCOLAX  Take 17 g by mouth daily.   prednisoLONE acetate 1 % ophthalmic suspension Commonly known as: PRED FORTE Place 1 drop into the left eye.   rosuvastatin  20 MG tablet Commonly known as: CRESTOR  Take 1 tablet (20 mg total) by mouth every morning.   spironolactone  25 MG tablet Commonly known as: ALDACTONE  Take 1 tablet (25 mg total) by mouth daily.  What changed: how much to take   VITAMIN B-12 PO Take 1 tablet by mouth daily.   Vitamin D  (Ergocalciferol ) 1.25 MG (50000 UNIT) Caps capsule Commonly known as: DRISDOL  Take 50,000 Units by mouth once a week.        Discharge Exam: Filed Weights   11/12/24 1535  Weight: 79.4 kg   General: Not in acute distress HEENT: No JVD Respiratory: Bibasilar decreased air entry Cardiovascular: Regular S1 and S2 GI: Soft nondistended bowel sounds present Extremities: No edema Musculoskeletal: No effusions full ROM Neurological: Alert awake and oriented, no focal logical deficit identified  Condition at discharge: stable  The results of significant diagnostics from this hospitalization (including imaging, microbiology, ancillary and laboratory) are listed below for reference.   Imaging Studies: US  THORACENTESIS ASP PLEURAL SPACE W/IMG  GUIDE Result Date: 11/13/2024 INDICATION: 73 year old female with history of breast cancer and presents with shortness of breath. Received request for diagnostic and therapeutic thoracentesis. EXAM: ULTRASOUND GUIDED DIAGNOSTIC AND THERAPEUTIC, LEFT-SIDED THORACENTESIS MEDICATIONS: 13 mL 1% lidocaine  COMPLICATIONS: None immediate. PROCEDURE: An ultrasound guided thoracentesis was thoroughly discussed with the patient and questions answered. The benefits, risks, alternatives and complications were also discussed. The patient understands and wishes to proceed with the procedure. Written consent was obtained. Ultrasound was performed to localize and mark an adequate pocket of fluid in the left chest. The area was then prepped and draped in the normal sterile fashion. 1% lidocaine  was used for local anesthesia. Under ultrasound guidance a 6 Fr Safe-T-Centesis catheter was introduced. Thoracentesis was performed. The catheter was removed and a dressing applied. FINDINGS: A total of approximately 1.2 liters of bloody fluid was removed. Samples were sent to the laboratory as requested by the clinical team. IMPRESSION: Successful ultrasound guided left thoracentesis yielding 1.2 liters of pleural fluid. Performed by: Kristi Davenport, NP under the direct supervision of Dr. Juliene Balder Electronically Signed   By: Juliene Balder M.D.   On: 11/13/2024 15:46   DG Chest 1 View Result Date: 11/13/2024 CLINICAL DATA:  Status post left thoracentesis EXAM: CHEST  1 VIEW COMPARISON:  Chest radiograph dated 11/12/2024 FINDINGS: Normal lung volumes. Similar opacification of the left lower lung. Large rounded lucency projecting over the lower mediastinum. Decreased large left pleural effusion. No pneumothorax. Heart borders are obscured. No acute osseous abnormality. IMPRESSION: 1. Decreased large left pleural effusion. No pneumothorax. 2. Similar opacification of the left lower lung. 3. Large rounded lucency projecting over the lower  mediastinum in keeping with large hiatal hernia. Electronically Signed   By: Limin  Xu M.D.   On: 11/13/2024 14:29   CT Chest W Contrast Result Date: 11/12/2024 EXAM: CT CHEST WITH CONTRAST 11/12/2024 06:03:21 PM TECHNIQUE: CT of the chest was performed with the administration of 75 mL of iohexol  (OMNIPAQUE ) 300 MG/ML solution. Multiplanar reformatted images are provided for review. Automated exposure control, iterative reconstruction, and/or weight based adjustment of the mA/kV was utilized to reduce the radiation dose to as low as reasonably achievable. COMPARISON: Chest radiograph of 11/12/2024. No prior chest CT. CLINICAL HISTORY: Pleural effusion, malignancy suspected. Prior right sided lumpectomy for breast cancer. * Tracking Code: BO * FINDINGS: MEDIASTINUM: Heart and pericardium are unremarkable. Normal heart size without pericardial effusion. The central airways are clear. Mediastinal shift to the right secondary to the large left pleural effusion. Aortic atherosclerosis (ICD10-I70.0). No central pulmonary embolism on this non-dedicated exam. LYMPH NODES: No supraclavicular adenopathy. Right axillary adenopathy, specifically a nodal mass of 1.5 x 2.0 cm on  image 32 / 2. No middle mediastinal or hilar adenopathy. Prominent prevascular nodes including up to 6 mm on image 39 / 2 are indeterminate. LUNGS AND PLEURA: Large left pleural effusion. Dependent irregularity of moderate left pleural thickening including on image 82 / 2. More nodular pleural thickening, and irregularity superiorly including up to 8 mm on image 20 / 2. The majority of the left lung is compressed. Minimal aerated left apex. Tiny right pleural effusion. Nodularity along the right major fissure including on image 47 / 3 is suspicious. There are also subtle parenchymal nodules throughout the right lung at maximally 4 mm on image 83 / 3. SOFT TISSUES/BONES: Osteopenia. Right mastectomy. UPPER ABDOMEN: Large hiatal hernia, with  approximately half of the stomach positioned in the lower chest. Organoaxial position / volvulus without complicating obstruction. Possible hepatic steatosis. Normal adrenal glands. IMPRESSION: 1. Large left pleural effusion with pleural nodularity, consistent with pleural metastasis. 2. Tiny right pleural effusion with findings suspicious for right pleural and pulmonary parenchymal metastases including nodularity along the right major fissure and scattered right lung nodules up to 4 mm. 3. Right axillary nodal metastasis in the setting of prior right mastectomy. 4. Large hiatal hernia with approximately half of the stomach positioned in the lower chest. Electronically signed by: Rockey Kilts MD 11/12/2024 06:36 PM EST RP Workstation: [id number]   DG Chest 2 View Result Date: 11/12/2024 CLINICAL DATA:  Shortness of breath EXAM: CHEST - 2 VIEW COMPARISON:  05/19/2022 FINDINGS: Large left-sided pleural effusion with mild shift of mediastinal contents to the right. Only small amount of aerated lung at left apex. Right lung grossly clear. Large air containing structure over the mediastinal silhouette most likely represents hiatal hernia. IMPRESSION: 1. Large left-sided pleural effusion with mild shift of mediastinal contents to the right. 2. Large air containing structure over the mediastinal silhouette most likely represents hiatal hernia. Electronically Signed   By: Luke Bun M.D.   On: 11/12/2024 16:41    Microbiology: Results for orders placed or performed during the hospital encounter of 11/12/24  Body fluid culture w Gram Stain     Status: None (Preliminary result)   Collection Time: 11/13/24  1:15 PM   Specimen: A: [facility] Cytology Pleural fluid   B: PATH Cytology Pleural fluid  Result Value Ref Range Status   Specimen Description   Final    PLEURAL Performed at [facility], [address], [place], [place] [address]    Special Requests   Final    PLEURAL Performed at [facility], [address], [place], [place] [address]    Gram Stain NO WBC SEEN NO ORGANISMS SEEN   Final   Culture   Final    NO GROWTH < 12 HOURS Performed at [facility], [address], [place], [place] [address]    Report Status PENDING  Incomplete    Labs: CBC: Recent Labs  Lab 11/12/24 1539 11/13/24 0432  WBC 5.5 5.5  NEUTROABS 4.3  --   HGB 12.9 12.4  HCT 40.8 39.3  MCV 92.1 91.6  PLT 275 236   Basic Metabolic Panel: Recent Labs  Lab 11/12/24 1539 11/13/24 0432  NA 139 136  K 4.0 4.3  CL 101 101  CO2 25 21*  GLUCOSE 116* 83  BUN 6* 7*  CREATININE 0.83 0.68  CALCIUM  9.6 9.3   Liver Function Tests: Recent Labs  Lab 11/12/24 1539  AST 22  ALT 8  ALKPHOS 95  BILITOT 0.4  PROT 7.3  ALBUMIN 4.0   CBG: Recent Labs  Lab 11/13/24 0744 11/13/24 1646 11/13/24 1934 11/14/24 0723 11/14/24 1126  GLUCAP 85 136* 179* 136* 83    Discharge time spent: greater than 30 minutes.  Signed: Nena Rebel, MD [facility] 11/14/2024

## 2024-11-14 NOTE — Plan of Care (Signed)
   Problem: Education: Goal: Ability to describe self-care measures that may prevent or decrease complications (Diabetes Survival Skills Education) will improve Outcome: Progressing   Problem: Coping: Goal: Ability to adjust to condition or change in health will improve Outcome: Progressing   Problem: Fluid Volume: Goal: Ability to maintain a balanced intake and output will improve Outcome: Progressing

## 2024-11-14 NOTE — Care Management Obs Status (Signed)
 MEDICARE OBSERVATION STATUS NOTIFICATION   Patient Details  Name: EILIDH MARCANO MRN: [id number] Date of Birth: 10-15-1951   Medicare Observation Status Notification Given:  Yes    Rojelio SHAUNNA Rattler 11/14/2024, 12:29 PM

## 2024-11-16 LAB — BODY FLUID CULTURE W GRAM STAIN
Culture: NO GROWTH
Gram Stain: NONE SEEN

## 2024-11-18 LAB — CHOLESTEROL, BODY FLUID: Cholesterol, Fluid: 75 mg/dL

## 2024-11-18 NOTE — Assessment & Plan Note (Signed)
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 S/P [facility]-Pending.  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  Monitor blood sugars closely. stable  # Bone health- April 2024- BMD measured at [facility] Right is 0.879 g/cm2 with a T-score of -1.1. Discussed the role of adjuvant Zometa 4 mg IV q 6 M x3 cycles. Will need dental clearance.  Discussed the potential side effects including but not limited to-infusion reaction; hypocalcemia; and Osteo-necrosis of jaw. Continue  ca+Vit D BID.    # IV access- s/p  Explantation-PIV.   # DISPOSITION: # mastectomy supplies  # follow up in 6 month-  MD; labs- cbc/cmp; Thyroid  profile; possible Zometa;- Dr.B

## 2024-11-19 ENCOUNTER — Inpatient Hospital Stay: Attending: Internal Medicine

## 2024-11-19 ENCOUNTER — Encounter: Payer: Self-pay | Admitting: Internal Medicine

## 2024-11-19 ENCOUNTER — Inpatient Hospital Stay: Admitting: Internal Medicine

## 2024-11-19 VITALS — BP 139/82 | HR 97 | Temp 96.7°F | Resp 16 | Ht 66.0 in | Wt 167.7 lb

## 2024-11-19 DIAGNOSIS — Z801 Family history of malignant neoplasm of trachea, bronchus and lung: Secondary | ICD-10-CM | POA: Insufficient documentation

## 2024-11-19 DIAGNOSIS — Z8052 Family history of malignant neoplasm of bladder: Secondary | ICD-10-CM | POA: Insufficient documentation

## 2024-11-19 DIAGNOSIS — Z9221 Personal history of antineoplastic chemotherapy: Secondary | ICD-10-CM | POA: Diagnosis not present

## 2024-11-19 DIAGNOSIS — Z7982 Long term (current) use of aspirin: Secondary | ICD-10-CM | POA: Diagnosis not present

## 2024-11-19 DIAGNOSIS — Z8049 Family history of malignant neoplasm of other genital organs: Secondary | ICD-10-CM | POA: Insufficient documentation

## 2024-11-19 DIAGNOSIS — Z853 Personal history of malignant neoplasm of breast: Secondary | ICD-10-CM | POA: Insufficient documentation

## 2024-11-19 DIAGNOSIS — J45909 Unspecified asthma, uncomplicated: Secondary | ICD-10-CM | POA: Insufficient documentation

## 2024-11-19 DIAGNOSIS — Z7985 Long-term (current) use of injectable non-insulin antidiabetic drugs: Secondary | ICD-10-CM | POA: Insufficient documentation

## 2024-11-19 DIAGNOSIS — I10 Essential (primary) hypertension: Secondary | ICD-10-CM | POA: Insufficient documentation

## 2024-11-19 DIAGNOSIS — Z8262 Family history of osteoporosis: Secondary | ICD-10-CM | POA: Insufficient documentation

## 2024-11-19 DIAGNOSIS — Z803 Family history of malignant neoplasm of breast: Secondary | ICD-10-CM | POA: Insufficient documentation

## 2024-11-19 DIAGNOSIS — E7800 Pure hypercholesterolemia, unspecified: Secondary | ICD-10-CM | POA: Insufficient documentation

## 2024-11-19 DIAGNOSIS — C50411 Malignant neoplasm of upper-outer quadrant of right female breast: Secondary | ICD-10-CM | POA: Diagnosis not present

## 2024-11-19 DIAGNOSIS — Z923 Personal history of irradiation: Secondary | ICD-10-CM | POA: Diagnosis not present

## 2024-11-19 DIAGNOSIS — E1142 Type 2 diabetes mellitus with diabetic polyneuropathy: Secondary | ICD-10-CM | POA: Diagnosis not present

## 2024-11-19 DIAGNOSIS — Z800 Family history of malignant neoplasm of digestive organs: Secondary | ICD-10-CM | POA: Insufficient documentation

## 2024-11-19 DIAGNOSIS — Z171 Estrogen receptor negative status [ER-]: Secondary | ICD-10-CM

## 2024-11-19 DIAGNOSIS — Z87891 Personal history of nicotine dependence: Secondary | ICD-10-CM | POA: Diagnosis not present

## 2024-11-19 DIAGNOSIS — Z79899 Other long term (current) drug therapy: Secondary | ICD-10-CM | POA: Insufficient documentation

## 2024-11-19 LAB — CBC WITH DIFFERENTIAL (CANCER CENTER ONLY)
Abs Immature Granulocytes: 0.03 K/uL (ref 0.00–0.07)
Basophils Absolute: 0 K/uL (ref 0.0–0.1)
Basophils Relative: 1 %
Eosinophils Absolute: 0 K/uL (ref 0.0–0.5)
Eosinophils Relative: 1 %
HCT: 40.7 % (ref 36.0–46.0)
Hemoglobin: 12.7 g/dL (ref 12.0–15.0)
Immature Granulocytes: 1 %
Lymphocytes Relative: 15 %
Lymphs Abs: 0.8 K/uL (ref 0.7–4.0)
MCH: 28.5 pg (ref 26.0–34.0)
MCHC: 31.2 g/dL (ref 30.0–36.0)
MCV: 91.3 fL (ref 80.0–100.0)
Monocytes Absolute: 0.2 K/uL (ref 0.1–1.0)
Monocytes Relative: 5 %
Neutro Abs: 4.1 K/uL (ref 1.7–7.7)
Neutrophils Relative %: 77 %
Platelet Count: 278 K/uL (ref 150–400)
RBC: 4.46 MIL/uL (ref 3.87–5.11)
RDW: 12.7 % (ref 11.5–15.5)
WBC Count: 5.2 K/uL (ref 4.0–10.5)
nRBC: 0 % (ref 0.0–0.2)

## 2024-11-19 LAB — CMP (CANCER CENTER ONLY)
ALT: 13 U/L (ref 0–44)
AST: 23 U/L (ref 15–41)
Albumin: 3.5 g/dL (ref 3.5–5.0)
Alkaline Phosphatase: 75 U/L (ref 38–126)
Anion gap: 13 (ref 5–15)
BUN: 9 mg/dL (ref 8–23)
CO2: 24 mmol/L (ref 22–32)
Calcium: 9.4 mg/dL (ref 8.9–10.3)
Chloride: 102 mmol/L (ref 98–111)
Creatinine: 0.96 mg/dL (ref 0.44–1.00)
GFR, Estimated: >60 mL/min (ref >60)
Glucose, Bld: 102 mg/dL — ABNORMAL HIGH (ref 70–99)
Potassium: 4.2 mmol/L (ref 3.5–5.1)
Sodium: 139 mmol/L (ref 135–145)
Total Bilirubin: 0.8 mg/dL (ref 0.0–1.2)
Total Protein: 7.5 g/dL (ref 6.5–8.1)

## 2024-11-19 NOTE — Progress Notes (Signed)
 Pt was on amoxicillin  2.5 weeks ago for uri, she feels like it didn't clear up, asking for abx. CT chest 11/12/24 and thoracentesis 11/13/24.

## 2024-11-19 NOTE — Progress Notes (Signed)
 [facility] CONSULT NOTE  Patient Care Team: Sowles, [name], MD as PCP - General (Family Medicine) Dessa, Reyes ORN, MD (General Surgery) [name], MD as Referring Physician (Otolaryngology) Florencio Cara BIRCH, MD as Consulting Physician (Cardiology) Cathlyn Seal, MD as Referring Physician (Dermatology) Cherilyn Debby CROME, MD as Consulting Physician (Internal Medicine) Rennie Cindy SAUNDERS, MD as Consulting Physician (Internal Medicine) Tye Millet, DO as Consulting Physician (General Surgery) P[facility], [facility]  CHIEF COMPLAINTS/PURPOSE OF CONSULTATION: Breast cancer  #  Oncology History Overview Note  She has a remote history of right breast cancer, T1bN0, ER positive,s/p lumpectomy and MammoSite radiation and 5 years of Arimidex.    02/19/22 screening mammogram bilaterally showed  1. Suspicious right breast mass at the 10 o'clock position 8 cm from the nipple. Recommend ultrasound-guided biopsy. 2. Indeterminate right breast mass at the 6 o'clock position 4 cm from the nipple. Recommend ultrasound-guided biopsy. 3. No suspicious right axillary lymphadenopathy.   04/12/2022 Unilateral right breast diagnostic mammogram showed 1. Suspicious right breast mass at the 10 o'clock position 8 cm from the nipple. Recommend ultrasound-guided biopsy. 2. Indeterminate right breast mass at the 6 o'clock position 4 cm from the nipple. Recommend ultrasound-guided biopsy. 3. No suspicious right axillary lymphadenopathy.   04/13/2022 right breast mass 6 o'clock position 1 cm from the nipple showed invasive mammary carcinoma, no special type. Grade 3, DCIS present high grade, LVI not identified. ER-, PR 1-10%, HER 2 -   04/13/2022, right breast mass 10:00 7 cm from nipple biopsy showed invasive mammary carcinoma, grade 3, high-grade DCIS with focal comedonecrosis, lymphovascular invasion not identified.  ER -, PR 1 to 10%, HER2-    04/26/2022, right breast mastectomy  with axillary dissection Invasive mammary carcinoma, no special type, multifocal, DCIS high-grade, benign nipple/areola.  2 deposits of invasive mammary carcinoma, no definite residual lymph node identified.  NOV 2025- CT chest-  Large left pleural effusion with pleural nodularity, consistent with pleural metastasis; Tiny right pleural effusion with findings suspicious for right pleural and pulmonary parenchymal metastases including nodularity along the right major fissure and scattered right lung nodules up to 4 mm; Right axillary nodal metastasis in the setting of prior right mastectomy.- s/p thora-    Breast cancer (HCC)  05/10/2022 Initial Diagnosis   Breast cancer (HCC)   05/10/2022 Cancer Staging   Staging form: Breast, AJCC 8th Edition - Pathologic stage from 05/10/2022: Stage IIA (pT1c, pN1a, cM0, G3, ER-, PR+, HER2-) - Signed by Babara Call, MD on 05/10/2022 Stage prefix: Initial diagnosis Histologic grading system: 3 grade system   05/24/2022 - 05/24/2022 Chemotherapy   Patient is on Treatment Plan : BREAST Pembrolizumab  (200) D1 + Carboplatin  (5) D1 + Paclitaxel  (80) D1,8,15 q21d X 4 cycles / Pembrolizumab  (200) D1 + AC D1 q21d x 4 cycles     05/27/2022 - 08/13/2022 Chemotherapy   Patient is on Treatment Plan : BREAST Pembrolizumab  (200) D1 + Carboplatin  (5) D1 + Paclitaxel  (80) D1,8,15 q21d X 4 cycles / Pembrolizumab  (200) D1 + AC D1 q21d x 4 cycles     05/27/2022 - 12/07/2022 Chemotherapy   Patient is on Treatment Plan : BREAST Pembrolizumab  (200) D1 + Carboplatin  (5) D1 + Paclitaxel  (80) D1,8,15 q21d X 4 cycles / Pembrolizumab  (200) D1 + AC D1 q21d x 4 cycles     01/04/2023 - 06/21/2023 Chemotherapy   Patient is on Treatment Plan : BREAST Pembrolizumab  (200) q21d x 27 weeks     Carcinoma of upper-outer quadrant  of right breast in female, estrogen receptor negative (HCC)  05/26/2022 Initial Diagnosis   Carcinoma of upper-outer quadrant of right breast in female, estrogen receptor negative  (HCC)   05/26/2022 Cancer Staging   Staging form: Breast, AJCC 8th Edition - Pathologic: Stage IIA (pT1c, pN1, cM0, G3, ER-, PR-, HER2-) - Signed by Rennie Cindy SAUNDERS, MD on 05/26/2022 Multigene prognostic tests performed: None Histologic grading system: 3 grade system   05/27/2022 - 12/07/2022 Chemotherapy   Patient is on Treatment Plan : BREAST Pembrolizumab  (200) D1 + Carboplatin  (5) D1 + Paclitaxel  (80) D1,8,15 q21d X 4 cycles / Pembrolizumab  (200) D1 + AC D1 q21d x 4 cycles     01/04/2023 - 06/21/2023 Chemotherapy   Patient is on Treatment Plan : BREAST Pembrolizumab  (200) q21d x 27 weeks      HISTORY OF PRESENTING ILLNESS: Ambulating independently.  Accompanied by husband.  Morgan Peterson 73 y.o.  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  Patient's status post thoracentesis.  She is here for follow-up.   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 but much improved.  Appetite is better. Denies any nausea . No fever no chills.   Review of Systems  Constitutional:  Positive for malaise/fatigue. Negative for chills, diaphoresis, fever and weight loss.  HENT:  Negative for nosebleeds and sore throat.   Eyes:  Negative for double vision.  Respiratory:  Negative for cough, hemoptysis, sputum production, shortness of breath and wheezing.   Cardiovascular:  Negative for chest pain, palpitations, orthopnea and leg swelling.  Gastrointestinal:  Negative for abdominal pain, blood in stool, constipation, diarrhea, heartburn, melena, nausea and vomiting.  Genitourinary:  Negative for dysuria, frequency and urgency.  Musculoskeletal:  Negative for back pain and joint  pain.  Skin: Negative.  Negative for itching and rash.  Neurological:  Positive for tingling. Negative for dizziness, focal weakness, weakness and headaches.  Endo/Heme/Allergies:  Does not bruise/bleed easily.  Psychiatric/Behavioral:  Negative for depression. The patient is not nervous/anxious and does not have insomnia.      MEDICAL HISTORY:  Past Medical History:  Diagnosis Date   Appetite loss    Asthma    Cervical radiculopathy    Chemotherapy-induced nausea    Hiatal hernia    Hypercholesterolemia    Hyperlipidemia    Hypertension    IBS (irritable bowel syndrome)    Malignant neoplasm of upper-outer quadrant of female breast (HCC) 04/11/2009   Right breast, invasive ductal carcinoma, 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 Arimidex therapy completed September 2015   Mild mitral valve prolapse    Neuropathy    feet, from chemo   Personal history of malignant neoplasm of breast 2010   Personal history of radiation therapy 2010   mammosite   T2DM (type 2 diabetes mellitus) (HCC) 2011    SURGICAL HISTORY: Past Surgical History:  Procedure Laterality Date   ABDOMINAL EXPLORATION SURGERY  2000   ovarian cyst   BREAST BIOPSY Right 2010   +    BREAST BIOPSY Left 04/06/2023   Left Breast Stereo Bx X Clip - path pending   BREAST BIOPSY Left 04/06/2023   Left Breast Stereo Bx, Coil Clip - path  pending   BREAST BIOPSY Left 04/06/2023   MM LT BREAST BX W LOC DEV 1ST LESION IMAGE BX SPEC STEREO GUIDE 04/06/2023 [facility]   BREAST BIOPSY Left 04/06/2023   MM LT BREAST BX W LOC DEV EA AD LESION IMG BX SPEC STEREO GUIDE 04/06/2023 [facility]   BREAST EXCISIONAL BIOPSY Right 2010   + mammo site inasive mammo ca   BREAST LUMPECTOMY Right 2010   [facility]   COLONOSCOPY  07/19/2012   Normal exam, Dr. Dessa   COLONOSCOPY WITH PROPOFOL  N/A 04/21/2022   Procedure: COLONOSCOPY WITH PROPOFOL ;  Surgeon: Dessa Reyes ORN,  MD;  Location: [facility];  Service: Endoscopy;  Laterality: N/A;   ESOPHAGOGASTRODUODENOSCOPY (EGD) WITH PROPOFOL  N/A 10/22/2022   Procedure: ESOPHAGOGASTRODUODENOSCOPY (EGD) WITH PROPOFOL ;  Surgeon: Jinny Carmine, MD;  Location[facility];  Service: Endoscopy;  Laterality: N/A;  Diabetic   MASTECTOMY Right 2010   partial/ lumpectomy   PORTACATH PLACEMENT Left 05/19/2022   Procedure: INSERTION PORT-A-CATH;  Surgeon: Dessa Reyes ORN, MD;  Location: [facility];  Service: General;  Laterality: Left;   SIMPLE MASTECTOMY WITH AXILLARY SENTINEL NODE BIOPSY Right 04/26/2022   Procedure: SIMPLE MASTECTOMY WITH AXILLARY SENTINEL NODE BIOPSY;  Surgeon: Dessa Reyes ORN, MD;  Location: [facility];  Service: General;  Laterality: Right;  RNFA to assist   TUBAL LIGATION  20 years ago    SOCIAL HISTORY: Social History   Socioeconomic History   Marital status: Married    Spouse name: Antonio   Number of children: 2   Years of education: Not on file   Highest education level: Not on file  Occupational History   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  Tobacco Use   Smoking status: Former    Current packs/day: 0.00    Average packs/day: 0.3 packs/day for 10.0 years (2.5 ttl pk-yrs)    Types: Cigarettes    Start date: 72    Quit date: 1988    Years since quitting: 37.9   Smokeless tobacco: Never   Tobacco comments:    smoking cessation materials not required  Vaping Use   Vaping status: Never Used  Substance and Sexual Activity   Alcohol use: Yes    Comment: wine occ   Drug use: No   Sexual activity: Not Currently    Comment: husband has ED  Other Topics Concern   Not on file  Social History Narrative   Oncology nurse on the floor retired.;  Husband retired from [employer].  No smoking.   Social Drivers of Corporate Investment Banker Strain: Low Risk  (04/12/2024)   Overall Financial Resource Strain (CARDIA)    Difficulty of Paying Living Expenses: Not hard at  all  Food Insecurity: No Food Insecurity (11/13/2024)   Hunger Vital Sign    Worried About Running Out of Food in the Last Year: Never true    Ran Out of Food in the Last Year: Never true  Transportation Needs: No Transportation Needs (11/13/2024)   PRAPARE - Administrator, Civil Service (Medical): No    Lack of Transportation (Non-Medical): No  Physical Activity: Sufficiently Active (04/12/2024)   Exercise Vital Sign    Days of Exercise per Week: 3 days    Minutes of Exercise per Session: 60 min  Stress: No Stress Concern Present (04/12/2024)   [employer] of Occupational Health - Occupational Stress Questionnaire    Feeling of Stress : Not at all  Social Connections: Unknown (11/13/2024)  Social Connection and Isolation Panel    Frequency of Communication with Friends and Family: More than three times a week    Frequency of Social Gatherings with Friends and Family: Twice a week    Attends Religious Services: Not on Marketing Executive or Organizations: No    Attends Banker Meetings: Never    Marital Status: Married  Catering Manager Violence: Not At Risk (11/13/2024)   Humiliation, Afraid, Rape, and Kick questionnaire    Fear of Current or Ex-Partner: No    Emotionally Abused: No    Physically Abused: No    Sexually Abused: No    FAMILY HISTORY: Family History  Problem Relation Age of Onset   Osteoporosis Mother    Diabetes Father    Kidney disease Father    Diabetes Brother    Breast cancer Maternal Aunt    Bladder Cancer Maternal Aunt    Cervical cancer Maternal Aunt    Colon cancer Maternal Uncle    Lung cancer Maternal Uncle     ALLERGIES:  is allergic to levaquin [levofloxacin in d5w], losartan, metoprolol, saxagliptin, amlodipine-olmesartan, canagliflozin, egg white [egg protein (egg white)], gabapentin , glipizide, gluten meal, lisinopril, metformin  and related, milk (cow), milk-related compounds, shrimp extract,  tramadol, zyprexa  [olanzapine ], actos [pioglitazone], atorvastatin , carvedilol , and prednisone .  MEDICATIONS:  Current Outpatient Medications  Medication Sig Dispense Refill   aspirin  EC 81 MG tablet Take 1 tablet (81 mg total) by mouth daily. (Patient taking differently: Take 81 mg by mouth 3 (three) times a week.) 30 tablet 0   atenolol  (TENORMIN ) 25 MG tablet Take 12.5 mg by mouth daily.     Azelastine  HCl 137 MCG/SPRAY SOLN PLACE 1 SPRAY INTO BOTH NOSTRILS 2 (TWO) TIMES DAILY. USE IN EACH NOSTRIL AS DIRECTED 90 mL 0   betamethasone dipropionate 0.05 % lotion Apply 1 Application topically daily.     betamethasone, augmented, (DIPROLENE) 0.05 % lotion Apply 1 application  topically 2 (two) times daily.     Blood Glucose Monitoring Suppl (ONETOUCH VERIO) w/Device KIT 1 Device by Does not apply route once. 1 kit 0   Cyanocobalamin (VITAMIN B-12 PO) Take 1 tablet by mouth daily.     EPINEPHrine  (EPIPEN  2-PAK) 0.3 mg/0.3 mL IJ SOAJ injection Inject 0.3 mg into the muscle as needed for anaphylaxis. 2 each 0   fexofenadine (ALLEGRA) 180 MG tablet Take 180 mg by mouth daily.     glucose blood (ONETOUCH VERIO) test strip Use as instructed 100 each 12   ketoconazole (NIZORAL) 2 % shampoo Apply 1 Application topically 3 (three) times a week.     Lancets (ONETOUCH ULTRASOFT) lancets Use as instructed 100 each 3   levocetirizine (XYZAL ) 5 MG tablet TAKE 1 TABLET (5 MG TOTAL) BY MOUTH DAILY. 90 tablet 1   minoxidil  (LONITEN ) 2.5 MG tablet Take 2.5 mg by mouth daily.     montelukast  (SINGULAIR ) 10 MG tablet Take 1 tablet (10 mg total) by mouth at bedtime. 90 tablet 1   Multiple Vitamin (MULTIVITAMIN WITH MINERALS) TABS tablet Take 1 tablet by mouth 2 (two) times a week.     omeprazole  (PRILOSEC) 20 MG capsule Take 1 capsule (20 mg total) by mouth daily. (Patient taking differently: Take 40 mg by mouth daily.) 90 capsule 1   OZEMPIC, 0.25 OR 0.5 MG/DOSE, 2 MG/1.5ML SOPN Inject 0.5 mg into the skin every  Sunday.     OZEMPIC, 0.25 OR 0.5 MG/DOSE, 2 MG/3ML SOPN Inject 0.5 mg  into the skin every 7 (seven) days.     polyethylene glycol (MIRALAX  / GLYCOLAX ) 17 g packet Take 17 g by mouth daily.     prednisoLONE acetate (PRED FORTE) 1 % ophthalmic suspension Place 1 drop into the left eye.     rosuvastatin  (CRESTOR ) 20 MG tablet Take 1 tablet (20 mg total) by mouth every morning. 90 tablet 1   spironolactone  (ALDACTONE ) 25 MG tablet Take 1 tablet (25 mg total) by mouth daily. (Patient taking differently: Take 12.5 mg by mouth daily.) 90 tablet 1   Vitamin D , Ergocalciferol , (DRISDOL ) 1.25 MG (50000 UNIT) CAPS capsule Take 50,000 Units by mouth once a week.     No current facility-administered medications for this visit.   Facility-Administered Medications Ordered in Other Visits  Medication Dose Route Frequency Provider Last Rate Last Admin   heparin  lock flush 100 UNIT/ML injection            heparin  lock flush 100 unit/mL  500 Units Intravenous Once Cadell Gabrielson R, MD          .  PHYSICAL EXAMINATION: ECOG PERFORMANCE STATUS: 0 - Asymptomatic  Vitals:   11/19/24 1051  BP: 139/82  Pulse: 97  Resp: 16  Temp: (!) 96.7 F (35.9 C)  SpO2: 97%       Filed Weights   11/19/24 1051  Weight: 167 lb 11.2 oz (76.1 kg)     Decreased breath sounds on the left compared to right.   Physical Exam Vitals and nursing note reviewed.  HENT:     Head: Normocephalic and atraumatic.     Mouth/Throat:     Pharynx: Oropharynx is clear.  Eyes:     Extraocular Movements: Extraocular movements intact.     Pupils: Pupils are equal, round, and reactive to light.  Cardiovascular:     Rate and Rhythm: Normal rate and regular rhythm.  Abdominal:     Palpations: Abdomen is soft.  Musculoskeletal:        General: Normal range of motion.     Cervical back: Normal range of motion.  Skin:    General: Skin is warm.  Neurological:     General: No focal deficit present.     Mental Status:  She is alert and oriented to person, place, and time.  Psychiatric:        Behavior: Behavior normal.        Judgment: Judgment normal.      LABORATORY DATA:  I have reviewed the data as listed Lab Results  Component Value Date   WBC 5.5 11/13/2024   HGB 12.4 11/13/2024   HCT 39.3 11/13/2024   MCV 91.6 11/13/2024   PLT 236 11/13/2024   Recent Labs    03/29/24 0939 09/18/24 1308 11/12/24 1539 11/13/24 0432  NA 136 137 139 136  K 4.2 4.2 4.0 4.3  CL 104 105 101 101  CO2 27 24 25  21*  GLUCOSE 115* 100* 116* 83  BUN 11 15 6* 7*  CREATININE 1.07* 0.91 0.83 0.68  CALCIUM  9.0 9.0 9.6 9.3  GFRNONAA 55* >60 >60 >60  PROT 6.8 7.0 7.3  --   ALBUMIN 3.6 3.6 4.0  --   AST 22 17 22   --   ALT 15 11 8   --   ALKPHOS 85 85 95  --   BILITOT 0.6 0.8 0.4  --     RADIOGRAPHIC STUDIES: I have personally reviewed the radiological images as listed and agreed with the findings in the  report. US  THORACENTESIS ASP PLEURAL SPACE W/IMG GUIDE Result Date: 11/13/2024 INDICATION: 73 year old female with history of breast cancer and presents with shortness of breath. Received request for diagnostic and therapeutic thoracentesis. EXAM: ULTRASOUND GUIDED DIAGNOSTIC AND THERAPEUTIC, LEFT-SIDED THORACENTESIS MEDICATIONS: 13 mL 1% lidocaine  COMPLICATIONS: None immediate. PROCEDURE: An ultrasound guided thoracentesis was thoroughly discussed with the patient and questions answered. The benefits, risks, alternatives and complications were also discussed. The patient understands and wishes to proceed with the procedure. Written consent was obtained. Ultrasound was performed to localize and mark an adequate pocket of fluid in the left chest. The area was then prepped and draped in the normal sterile fashion. 1% lidocaine  was used for local anesthesia. Under ultrasound guidance a 6 Fr Safe-T-Centesis catheter was introduced. Thoracentesis was performed. The catheter was removed and a dressing applied. FINDINGS: A  total of approximately 1.2 liters of bloody fluid was removed. Samples were sent to the laboratory as requested by the clinical team. IMPRESSION: Successful ultrasound guided left thoracentesis yielding 1.2 liters of pleural fluid. Performed by: Kristi Davenport, NP under the direct supervision of Dr. Juliene Balder Electronically Signed   By: Juliene Balder M.D.   On: 11/13/2024 15:46   DG Chest 1 View Result Date: 11/13/2024 CLINICAL DATA:  Status post left thoracentesis EXAM: CHEST  1 VIEW COMPARISON:  Chest radiograph dated 11/12/2024 FINDINGS: Normal lung volumes. Similar opacification of the left lower lung. Large rounded lucency projecting over the lower mediastinum. Decreased large left pleural effusion. No pneumothorax. Heart borders are obscured. No acute osseous abnormality. IMPRESSION: 1. Decreased large left pleural effusion. No pneumothorax. 2. Similar opacification of the left lower lung. 3. Large rounded lucency projecting over the lower mediastinum in keeping with large hiatal hernia. Electronically Signed   By: Limin  Xu M.D.   On: 11/13/2024 14:29   CT Chest W Contrast Result Date: 11/12/2024 EXAM: CT CHEST WITH CONTRAST 11/12/2024 06:03:21 PM TECHNIQUE: CT of the chest was performed with the administration of 75 mL of iohexol  (OMNIPAQUE ) 300 MG/ML solution. Multiplanar reformatted images are provided for review. Automated exposure control, iterative reconstruction, and/or weight based adjustment of the mA/kV was utilized to reduce the radiation dose to as low as reasonably achievable. COMPARISON: Chest radiograph of 11/12/2024. No prior chest CT. CLINICAL HISTORY: Pleural effusion, malignancy suspected. Prior right sided lumpectomy for breast cancer. * Tracking Code: BO * FINDINGS: MEDIASTINUM: Heart and pericardium are unremarkable. Normal heart size without pericardial effusion. The central airways are clear. Mediastinal shift to the right secondary to the large left pleural effusion. Aortic  atherosclerosis (ICD10-I70.0). No central pulmonary embolism on this non-dedicated exam. LYMPH NODES: No supraclavicular adenopathy. Right axillary adenopathy, specifically a nodal mass of 1.5 x 2.0 cm on image 32 / 2. No middle mediastinal or hilar adenopathy. Prominent prevascular nodes including up to 6 mm on image 39 / 2 are indeterminate. LUNGS AND PLEURA: Large left pleural effusion. Dependent irregularity of moderate left pleural thickening including on image 82 / 2. More nodular pleural thickening, and irregularity superiorly including up to 8 mm on image 20 / 2. The majority of the left lung is compressed. Minimal aerated left apex. Tiny right pleural effusion. Nodularity along the right major fissure including on image 47 / 3 is suspicious. There are also subtle parenchymal nodules throughout the right lung at maximally 4 mm on image 83 / 3. SOFT TISSUES/BONES: Osteopenia. Right mastectomy. UPPER ABDOMEN: Large hiatal hernia, with approximately half of the stomach positioned in the lower  chest. Organoaxial position / volvulus without complicating obstruction. Possible hepatic steatosis. Normal adrenal glands. IMPRESSION: 1. Large left pleural effusion with pleural nodularity, consistent with pleural metastasis. 2. Tiny right pleural effusion with findings suspicious for right pleural and pulmonary parenchymal metastases including nodularity along the right major fissure and scattered right lung nodules up to 4 mm. 3. Right axillary nodal metastasis in the setting of prior right mastectomy. 4. Large hiatal hernia with approximately half of the stomach positioned in the lower chest. Electronically signed by: Rockey Kilts MD 11/12/2024 06:36 PM EST RP Workstation: [id number]   DG Chest 2 View Result Date: 11/12/2024 CLINICAL DATA:  Shortness of breath EXAM: CHEST - 2 VIEW COMPARISON:  05/19/2022 FINDINGS: Large left-sided pleural effusion with mild shift of mediastinal contents to the right. Only small  amount of aerated lung at left apex. Right lung grossly clear. Large air containing structure over the mediastinal silhouette most likely represents hiatal hernia. IMPRESSION: 1. Large left-sided pleural effusion with mild shift of mediastinal contents to the right. 2. Large air containing structure over the mediastinal silhouette most likely represents hiatal hernia. Electronically Signed   By: Luke Bun M.D.   On: 11/12/2024 16:41     ASSESSMENT & PLAN:   Carcinoma of upper-outer quadrant of right breast in female, estrogen receptor negative (HCC) # NOV 2025- clinically highly concerning for recurrence/STAGE IV [2023- Hx of Stage II triple negative breast cancer [mT1cN1; Grade 3 s/p adjuvant KEYNOTE 522]]- based on pleural effusions/ pleural nodules- on CT scan. S/P [facility]-Pending.  Reached out to pathology Dr. Ronnette.  # NOV 2025- CT chest-  Large left pleural effusion with pleural nodularity, consistent with pleural metastasis; Tiny right pleural effusion with findings suspicious for right pleural and pulmonary parenchymal metastases including nodularity along the right major fissure and scattered right lung nodules up to 4 mm; Right axillary nodal metastasis in the setting of prior right mastectomy.  Recommend PET scan ASAP.  Also discussed regarding possible need for a biopsy of the right axillary lymph node based upon the results of PET scan.  # Discussed with patient and husband that there is significant concerns of recurrent breast cancer/disease based on imaging findings.  However wait for above workup.  Also will plan NGS testing on the sample; and also will need liquid biopsy at next visit.  Discussed that she will likely need chemotherapy.  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  Monitor blood sugars closely. Stable  # Bone health- April 2024- BMD measured at [facility]  is 0.879 g/cm2 with a T-score of -1.1.  Await PET scan to consider Zometa.  # #Incidental findings on Imaging  CT , 2025: History of chronic -large hiatal hernia with approximately half of the stomach positioned in the lower chest. I reviewed/discussed/counseled the patient.    # IV access- s/p  Explantation-PIV.  Consider port placement again.  Will call PET # DISPOSITION: # PET scan ASAP  # cancel March appts. # labs today- cbc/cmp; cea; ca 27-29; ca 15-3- please order today # follow up in appx 2 weeks- MD; no labs-  Dr.B  # I reviewed the blood work- with the patient in detail; also reviewed the imaging independently [as summarized above]; and with the patient in detail.   # 40 minutes face-to-face with the patient discussing the above plan of care; more than 50% of time spent on prognosis/  natural history; counseling and coordination.   All questions were answered. The patient/family knows to call the clinic with any problems, questions or concerns.    Cindy JONELLE Joe, MD 11/19/2024 12:01 PM

## 2024-11-20 ENCOUNTER — Other Ambulatory Visit (HOSPITAL_COMMUNITY): Payer: Self-pay

## 2024-11-20 ENCOUNTER — Other Ambulatory Visit: Payer: Self-pay | Admitting: Internal Medicine

## 2024-11-20 ENCOUNTER — Telehealth: Payer: Self-pay

## 2024-11-20 LAB — THYROID PANEL WITH TSH
Free Thyroxine Index: 1.9 (ref 1.2–4.9)
T3 Uptake Ratio: 23 % — ABNORMAL LOW (ref 24–39)
T4, Total: 8.3 ug/dL (ref 4.5–12.0)
TSH: 2.68 u[IU]/mL (ref 0.450–4.500)

## 2024-11-20 LAB — CEA: CEA: 2.7 ng/mL (ref 0.0–4.7)

## 2024-11-20 LAB — CANCER ANTIGEN 15-3: CA 15-3: 278 U/mL — ABNORMAL HIGH (ref 0.0–25.0)

## 2024-11-20 LAB — CANCER ANTIGEN 27.29: CA 27.29: 345.6 U/mL — ABNORMAL HIGH (ref 0.0–38.6)

## 2024-11-20 MED ORDER — DIAZEPAM 2 MG PO TABS: ORAL_TABLET | ORAL | 0 refills | Status: DC

## 2024-11-20 NOTE — Telephone Encounter (Signed)
 Patient calling to request medication to help her relax during PET scan tomorrow.

## 2024-11-20 NOTE — Progress Notes (Signed)
 Called in valium- GB

## 2024-11-20 NOTE — Telephone Encounter (Signed)
 Patient notified

## 2024-11-21 ENCOUNTER — Other Ambulatory Visit (HOSPITAL_COMMUNITY): Payer: Self-pay

## 2024-11-21 ENCOUNTER — Encounter
Admission: RE | Admit: 2024-11-21 | Discharge: 2024-11-21 | Disposition: A | Discharge status: Home / Self Care | Source: Ambulatory Visit | Attending: Internal Medicine | Admitting: Internal Medicine

## 2024-11-21 DIAGNOSIS — R590 Localized enlarged lymph nodes: Secondary | ICD-10-CM | POA: Diagnosis not present

## 2024-11-21 DIAGNOSIS — Z171 Estrogen receptor negative status [ER-]: Secondary | ICD-10-CM | POA: Diagnosis not present

## 2024-11-21 DIAGNOSIS — K449 Diaphragmatic hernia without obstruction or gangrene: Secondary | ICD-10-CM | POA: Diagnosis not present

## 2024-11-21 DIAGNOSIS — C50411 Malignant neoplasm of upper-outer quadrant of right female breast: Secondary | ICD-10-CM | POA: Diagnosis not present

## 2024-11-21 DIAGNOSIS — C50911 Malignant neoplasm of unspecified site of right female breast: Secondary | ICD-10-CM | POA: Diagnosis not present

## 2024-11-21 LAB — GLUCOSE, CAPILLARY: Glucose-Capillary: 110 mg/dL — ABNORMAL HIGH (ref 70–99)

## 2024-11-21 LAB — CYTOLOGY - NON PAP

## 2024-11-21 MED ORDER — FLUDEOXYGLUCOSE F - 18 (FDG) INJECTION
9.2700 | Freq: Once | INTRAVENOUS | Status: AC | PRN
  Administered 2024-11-21: 9.27 via INTRAVENOUS

## 2024-11-22 ENCOUNTER — Other Ambulatory Visit: Payer: Self-pay | Admitting: *Deleted

## 2024-11-22 ENCOUNTER — Telehealth: Payer: Self-pay | Admitting: *Deleted

## 2024-11-22 ENCOUNTER — Ambulatory Visit: Payer: Self-pay | Admitting: Family Medicine

## 2024-11-22 ENCOUNTER — Telehealth: Payer: Self-pay | Admitting: Internal Medicine

## 2024-11-22 DIAGNOSIS — C50411 Malignant neoplasm of upper-outer quadrant of right female breast: Secondary | ICD-10-CM

## 2024-11-22 DIAGNOSIS — J90 Pleural effusion, not elsewhere classified: Secondary | ICD-10-CM

## 2024-11-22 DIAGNOSIS — J948 Other specified pleural conditions: Secondary | ICD-10-CM

## 2024-11-22 LAB — MISC LABCORP TEST (SEND OUT): Labcorp test code: 9985

## 2024-11-22 NOTE — Telephone Encounter (Signed)
 Called patient to discuss the need for another biopsy. 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 Pt agrees to have the biopsy but states the thoracentesis was so painful, she is still having pain from it. I informed her that this procedure they would use moderate sedation so , nothing to eat or drink after midnight. No aspirin  products 5 days prior to procedure (pt states she has not taken aspirin  since leaving the hospital). She will need a driver. Procedure date is Weds 12/05/24 arrival time 10 am [facility] the [facility].

## 2024-11-22 NOTE — Telephone Encounter (Signed)
 I spoke to patient regarding results of the PET scan-scheduled for biopsy.  Patient currently asymptomatic from her breathing-however given the ongoing pleural effusion please schedule left-sided thoracentesis-at the time of lung biopsy on the 17th  Please reschedule the patient's visit-with me-3 to 4 days after the biopsy.  GB

## 2024-11-23 ENCOUNTER — Other Ambulatory Visit: Payer: Self-pay | Admitting: *Deleted

## 2024-11-23 DIAGNOSIS — J90 Pleural effusion, not elsewhere classified: Secondary | ICD-10-CM

## 2024-11-27 ENCOUNTER — Telehealth: Payer: Self-pay | Admitting: *Deleted

## 2024-11-27 ENCOUNTER — Other Ambulatory Visit: Payer: Self-pay | Admitting: *Deleted

## 2024-11-27 ENCOUNTER — Telehealth: Payer: Self-pay

## 2024-11-27 DIAGNOSIS — J90 Pleural effusion, not elsewhere classified: Secondary | ICD-10-CM

## 2024-11-27 NOTE — Telephone Encounter (Signed)
 Patient calling to report shortness of breath.  She was advised by Dr. B to call the office if she starts exhibiting symptoms from pleural effusion.   Requesting a visit for evaluation.

## 2024-11-27 NOTE — Telephone Encounter (Signed)
 Patient called complaining of increased dyspnea. She has a known pleural effusion. I have reached out to IR to see if Thoracentesis can be moved up. Originally requested for 12/05/24.

## 2024-11-28 ENCOUNTER — Ambulatory Visit
Admission: RE | Admit: 2024-11-28 | Discharge: 2024-11-28 | Disposition: A | Discharge status: Home / Self Care | Source: Ambulatory Visit | Attending: Radiology | Admitting: Radiology

## 2024-11-28 ENCOUNTER — Ambulatory Visit: Admission: RE | Admit: 2024-11-28 | Discharge: 2024-11-28 | Attending: Internal Medicine

## 2024-11-28 ENCOUNTER — Other Ambulatory Visit: Payer: Self-pay | Admitting: Radiology

## 2024-11-28 DIAGNOSIS — Z9889 Other specified postprocedural states: Secondary | ICD-10-CM

## 2024-11-28 DIAGNOSIS — J90 Pleural effusion, not elsewhere classified: Secondary | ICD-10-CM | POA: Insufficient documentation

## 2024-11-28 MED ORDER — LIDOCAINE HCL (PF) 1 % IJ SOLN
10.0000 mL | Freq: Once | INTRAMUSCULAR | Status: AC
  Administered 2024-11-28: 10 mL via INTRADERMAL
  Filled 2024-11-28: qty 10

## 2024-11-28 NOTE — Discharge Instructions (Signed)
 Reviewed with patient

## 2024-11-28 NOTE — Procedures (Signed)
 PROCEDURE SUMMARY:  Successful US  guided left-sided thoracentesis. Yielded 1.4L of bloody fluid. Patient tolerated procedure well. No immediate complications. EBL = trace  Post procedure chest X-ray reveals no pneumothorax  Kendrick Haapala CHRISTELLA Bal PA-C 11/28/2024 12:03 PM

## 2024-12-02 ENCOUNTER — Encounter (HOSPITAL_COMMUNITY): Payer: Self-pay | Admitting: Radiology

## 2024-12-02 NOTE — H&P (Shared)
 Chief Complaint: Left Pleural Base Lesion. Request is for biopsy  Referring Physician(s): Rennie Cindy SAUNDERS  Supervising Physician: Luverne Aran  Patient Status: [facility] - Out-pt  History of Present Illness: Morgan Peterson is a 73 y.o. female  outpatient. Known to IR. History of CLD, HTN, DM, Breast cancer (right) s/p radiation with recurrent left side pleural effusion. Cytology positive for metastatic disease. PET scan shows hypertmatbolic left pleural base lesion. PET scan from 12.3.25 readsThere is extensive hyper-metabolic nodularity throughout the left pleural space consistent with known pleural metastatic disease. The most intense hypermetabolic activity is located dependently in the lower left hemithorax (SUV max 11.3). Persistent large left pleural effusion with near complete collapse of the left lung. Team is requesting left pleural base lesion. Case reviewed and approved by IR Attending Dr. Cordella Banner.  Last thoracentesis performed on 12.10.25 with 1.4 liters of plural fluid removed.  ***Labs pending. All medications are within acceptable parameters.  See list of allergies for any pertinent allergies. Patient has been NPO since midnight.   Return precautions and treatment recommendations and follow-up discussed with the patient *** who is agreeable with the plan.    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 Arimidex therapy completed September 2015   Mild mitral valve prolapse    Neuropathy    feet, from chemo   Personal history of malignant neoplasm of  breast 2010   Personal history of radiation therapy 2010   mammosite   T2DM (type 2 diabetes mellitus) (HCC) 2011    Past Surgical History:  Procedure Laterality Date   ABDOMINAL EXPLORATION SURGERY  2000   ovarian cyst   BREAST BIOPSY Right 2010   +    BREAST BIOPSY Left 04/06/2023   Left Breast Stereo Bx X Clip - path pending   BREAST BIOPSY Left 04/06/2023   Left Breast Stereo Bx, Coil Clip - path pending   BREAST BIOPSY Left 04/06/2023   MM LT BREAST BX W LOC DEV 1ST LESION IMAGE BX SPEC STEREO GUIDE 04/06/2023 [facility]   BREAST BIOPSY Left 04/06/2023   MM LT BREAST BX W LOC DEV EA AD LESION IMG BX SPEC STEREO GUIDE 04/06/2023 [facility]   BREAST EXCISIONAL BIOPSY Right 2010   + mammo site inasive mammo ca   BREAST LUMPECTOMY Right 2010   [facility]   COLONOSCOPY  07/19/2012   Normal exam, Dr. Dessa   COLONOSCOPY WITH PROPOFOL  N/A 04/21/2022   Procedure: COLONOSCOPY WITH PROPOFOL ;  Surgeon: Dessa Reyes ORN, MD;  Location: [facility];  Service: Endoscopy;  Laterality: N/A;   ESOPHAGOGASTRODUODENOSCOPY (EGD) WITH PROPOFOL  N/A 10/22/2022   Procedure: ESOPHAGOGASTRODUODENOSCOPY (EGD) WITH PROPOFOL ;  Surgeon: Jinny Carmine, MD;  [facility];  Service: Endoscopy;  Laterality: N/A;  Diabetic   MASTECTOMY Right 2010   partial/ lumpectomy   PORTACATH PLACEMENT Left 05/19/2022   Procedure: INSERTION PORT-A-CATH;  Surgeon: Dessa Reyes ORN, MD;  Location: [facility] OR[facility];  Service: General;  Laterality: Left;   SIMPLE MASTECTOMY WITH AXILLARY SENTINEL NODE BIOPSY Right 04/26/2022   Procedure: SIMPLE  MASTECTOMY WITH AXILLARY SENTINEL NODE BIOPSY;  Surgeon: Dessa Reyes ORN, MD;  Location: [facility];  Service: General;  Laterality: Right;  RNFA to assist   TUBAL LIGATION  20 years ago    Allergies: Levaquin [levofloxacin in d5w], Losartan, Metoprolol, Saxagliptin, Amlodipine-olmesartan, Canagliflozin, Egg white [egg protein (egg white)], Gabapentin , Glipizide,  Gluten meal, Lisinopril, Metformin  and related, Milk (cow), Milk-related compounds, Shrimp extract, Tramadol, Zyprexa  [olanzapine ], Actos [pioglitazone], Atorvastatin , Carvedilol , and Prednisone   Medications: Prior to Admission medications  Medication Sig Start Date End Date Taking? Authorizing Provider  aspirin  EC 81 MG tablet Take 1 tablet (81 mg total) by mouth daily. Patient taking differently: Take 81 mg by mouth 3 (three) times a week. 08/18/17   Sowles, [name], MD  atenolol  (TENORMIN ) 25 MG tablet Take 12.5 mg by mouth daily.    [provider]  Azelastine  HCl 137 MCG/SPRAY SOLN PLACE 1 SPRAY INTO BOTH NOSTRILS 2 (TWO) TIMES DAILY. USE IN EACH NOSTRIL AS DIRECTED 11/01/24   [name], [name], MD  betamethasone dipropionate 0.05 % lotion Apply 1 Application topically daily. 01/02/24   [provider]  betamethasone, augmented, (DIPROLENE) 0.05 % lotion Apply 1 application  topically 2 (two) times daily. 11/05/21   [provider]  Blood Glucose Monitoring Suppl (ONETOUCH VERIO) w/Device KIT 1 Device by Does not apply route once. 01/12/16   Harrie Pump, MD  Cyanocobalamin (VITAMIN B-12 PO) Take 1 tablet by mouth daily.    [provider]  diazepam  (VALIUM ) 2 MG tablet One pill 45-60 mins prior to procedure; 1 pill 15 mins prior if needed. 11/20/24   Brahmanday, Govinda R, MD  EPINEPHrine  (EPIPEN  2-PAK) 0.3 mg/0.3 mL IJ SOAJ injection Inject 0.3 mg into the muscle as needed for anaphylaxis. 03/13/24   Sowles, [name], MD  fexofenadine (ALLEGRA) 180 MG tablet Take 180 mg by mouth daily.    [provider]  glucose blood (ONETOUCH VERIO) test strip Use as instructed 02/13/18   Sowles, [name], MD  ketoconazole (NIZORAL) 2 % shampoo Apply 1 Application topically 3 (three) times a week. 01/02/24   [provider]  Lancets JANETT ULTRASOFT) lancets Use as instructed 01/07/16   Harrie Pump, MD  levocetirizine (XYZAL ) 5 MG tablet TAKE 1 TABLET  (5 MG TOTAL) BY MOUTH DAILY. 06/14/24   Sowles, [name], MD  minoxidil  (LONITEN ) 2.5 MG tablet Take 2.5 mg by mouth daily. 01/02/24   [provider]  montelukast  (SINGULAIR ) 10 MG tablet Take 1 tablet (10 mg total) by mouth at bedtime. 09/13/24   Sowles, [name], MD  Multiple Vitamin (MULTIVITAMIN WITH MINERALS) TABS tablet Take 1 tablet by mouth 2 (two) times a week.    [provider]  omeprazole  (PRILOSEC) 20 MG capsule Take 1 capsule (20 mg total) by mouth daily. Patient taking differently: Take 40 mg by mouth daily. 09/13/24   Sowles, [name], MD  OZEMPIC, 0.25 OR 0.5 MG/DOSE, 2 MG/1.5ML SOPN Inject 0.5 mg into the skin every Sunday. 01/16/19   Cherilyn Debby CROME, MD  OZEMPIC, 0.25 OR 0.5 MG/DOSE, 2 MG/3ML SOPN Inject 0.5 mg into the skin every 7 (seven) days.    [provider]  polyethylene glycol (MIRALAX  / GLYCOLAX ) 17 g packet Take 17 g by mouth daily.    [provider]  prednisoLONE acetate (PRED FORTE) 1 % ophthalmic suspension Place 1 drop into the left eye. 12/08/23   [provider]  rosuvastatin  (CRESTOR ) 20 MG tablet Take 1 tablet (20 mg total) by mouth every morning. 09/13/24  Sowles, [name], MD  spironolactone  (ALDACTONE ) 25 MG tablet Take 1 tablet (25 mg total) by mouth daily. Patient taking differently: Take 12.5 mg by mouth daily. 09/13/24   Sowles, [name], MD  Vitamin D , Ergocalciferol , (DRISDOL ) 1.25 MG (50000 UNIT) CAPS capsule Take 50,000 Units by mouth once a week. 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  Tobacco Use   Smoking status: Former    Current packs/day: 0.00    Average packs/day: 0.3 packs/day for 10.0 years (2.5 ttl pk-yrs)    Types: Cigarettes    Start date: 54    Quit date: 1988    Years since quitting: 37.9   Smokeless tobacco: Never   Tobacco comments:    smoking cessation materials not required  Vaping Use   Vaping status: Never Used  Substance and Sexual Activity   Alcohol use: Yes    Comment: wine occ   Drug use: No   Sexual activity: Not Currently    Comment: husband has ED  Other Topics Concern   Not on file  Social History Narrative   Oncology nurse on the floor retired.;  Husband retired from [employer].  No smoking.   Social Drivers of Health   Tobacco Use: Medium Risk (11/19/2024)   Patient History    Smoking Tobacco Use: Former    Smokeless Tobacco Use: Never    Passive Exposure: Not on file  Financial Resource Strain: Low Risk (04/12/2024)   Overall Financial Resource Strain (CARDIA)    Difficulty of Paying Living Expenses: Not hard at all  Food Insecurity: No Food Insecurity (11/13/2024)   Epic    Worried About Programme Researcher, Broadcasting/film/video in the Last Year: Never true    Ran Out of Food in the Last Year: Never true  Transportation Needs: No Transportation Needs (11/13/2024)   Epic    Lack of Transportation (Medical): No    Lack of Transportation (Non-Medical): No  Physical Activity: Sufficiently Active (04/12/2024)   Exercise Vital Sign    Days of Exercise per Week: 3 days    Minutes of Exercise per Session: 60 min  Stress: No Stress Concern Present (04/12/2024)   [employer] of Occupational Health - Occupational Stress Questionnaire    Feeling of Stress : Not at all  Social Connections: Unknown (11/13/2024)   Social Connection and Isolation Panel    Frequency of Communication with Friends and Family: More than  three times a week    Frequency of Social Gatherings with Friends and Family: Twice a week    Attends Religious Services: Not on file    Active Member of Clubs or Organizations: No    Attends Banker Meetings: Never    Marital Status: Married  Depression (PHQ2-9): Low Risk (11/19/2024)   Depression (PHQ2-9)  PHQ-2 Score: 0  Alcohol Screen: Low Risk (04/12/2024)   Alcohol Screen    Last Alcohol Screening Score (AUDIT): 0  Housing: Low Risk (11/13/2024)   Epic    Unable to Pay for Housing in the Last Year: No    Number of Times Moved in the Last Year: 0    Homeless in the Last Year: No  Utilities: Not At Risk (11/13/2024)   Epic    Threatened with loss of utilities: No  Health Literacy: Adequate Health Literacy (04/12/2024)   B1300 Health Literacy    Frequency of need for help with medical instructions: Never    ECOG Status: {CHL ONC ECOG [phone]}  Review of Systems: A 12 point ROS discussed and pertinent positives are indicated in the HPI above.  All other systems are negative.  Review of Systems  Vital Signs: There were no vitals taken for this visit.  Advance Care Plan: {Advance Care Eojw:[id number]}    Physical Exam  Imaging: US  THORACENTESIS ASP PLEURAL SPACE W/IMG GUIDE Result Date: 11/28/2024 INDICATION: 73 year old female with a history of breast cancer and recurrent left-sided pleural effusion. Request for therapeutic thoracentesis. EXAM: ULTRASOUND GUIDED THERAPEUTIC, LEFT-SIDED THORACENTESIS MEDICATIONS: 1% lidocaine , 8 mL. COMPLICATIONS: None immediate. PROCEDURE: An ultrasound guided thoracentesis was thoroughly discussed with the patient and questions answered. The benefits, risks, alternatives and complications were also discussed. The patient understands and wishes to proceed with the procedure. Written consent was obtained. Ultrasound was performed to localize and mark an adequate pocket of fluid in the left chest. The area was then prepped and  draped in the normal sterile fashion. 1% Lidocaine  was used for local anesthesia. Under ultrasound guidance a 6 Fr Safe-T-Centesis catheter was introduced. Thoracentesis was performed. The catheter was removed and a dressing applied. FINDINGS: A total of approximately 1.4 L of bloody fluid was removed. IMPRESSION: Successful ultrasound guided left-sided thoracentesis yielding 1.4 L of pleural fluid. Procedure performed by: Sherrilee Bal, PA-C under the supervision of Dr. VEAR Lent Electronically Signed   By: Wilkie Lent M.D.   On: 11/28/2024 12:20   DG Chest Port 1 View Result Date: 11/28/2024 CLINICAL DATA:  Status post left thoracentesis. EXAM: PORTABLE CHEST 1 VIEW COMPARISON:  11/13/2024 FINDINGS: Stable large air containing hiatal hernia. Moderate-sized left pleural effusion, decreased following thoracentesis. No pneumothorax. Mildly improved left basilar atelectasis. Minimal right pleural effusion. Clear right lung. Normal-sized heart. Thoracolumbar spine degenerative changes and mild scoliosis. IMPRESSION: 1. Moderate-sized left pleural effusion, decreased following thoracentesis. 2. No pneumothorax. 3. Mildly improved left basilar atelectasis. 4. Minimal right pleural effusion. 5. Stable large hiatal hernia. Electronically Signed   By: Elspeth Bathe M.D.   On: 11/28/2024 11:49   NM PET Image Initial (PI) Skull Base To Thigh (F-18 FDG) Result Date: 11/21/2024 CLINICAL DATA:  Subsequent treatment strategy for right breast cancer. Metastatic disease on recent left thoracentesis. EXAM: NUCLEAR MEDICINE PET SKULL BASE TO THIGH TECHNIQUE: 9.27 mCi F-18 FDG was injected intravenously. Full-ring PET imaging was performed from the skull base to thigh after the radiotracer. CT data was obtained and used for attenuation correction and anatomic localization. Fasting blood glucose: 110 mg/dl COMPARISON:  Chest CT 88/75/7974 FINDINGS: Mediastinal blood pool activity: SUV max 2.6 NECK: No hypermetabolic  cervical lymph nodes are identified. No suspicious activity identified within the pharyngeal mucosal space. Incidental CT findings: none CHEST: There is extensive hypermetabolic nodularity throughout the left pleural space consistent with known pleural metastatic disease. The most intense hypermetabolic activity is located dependently in the  lower left hemithorax (SUV max 11.3). Persistent large left pleural effusion with near complete collapse of the left lung. Trace right pleural effusion without right pleural hypermetabolic activity. The small pleural-based and parenchymal nodules in the right hemithorax seen on previous CT are too small to evaluate by PET-CT. There are multiple hypermetabolic mediastinal, left hilar and left internal mammary lymph nodes. Representative nodes include in AP window node with an SUV max of 9.9 and a left hilar node with an SUV max of 12.5. Previously noted 2.2 x 1.4 cm right axillary nodal mass on image 41/6 demonstrates only low level metabolic activity (SUV max 4.5), and may be treatment related. There is no hypermetabolic left axillary adenopathy. Incidental CT findings: Unchanged mediastinal shift to the right attributed to the large left pleural effusion. Moderate to large hiatal hernia. Mild aortic atherosclerosis. Status post right mastectomy without evidence of chest wall recurrence. ABDOMEN/PELVIS: There is no hypermetabolic activity within the liver, adrenal glands, spleen or pancreas. Scattered small retroperitoneal lymph nodes with low level metabolic activity, likely physiologic. Bowel activity within physiologic limits. No suspicious adnexal findings. Incidental CT findings: Mild aortoiliac atherosclerosis. No ascites or peritoneal nodularity. SKELETON: Small lytic lesion anteriorly in the T7 vertebral body demonstrates mild hypermetabolic activity (SUV max 5.3). There are scattered sclerotic lesions within the lumbar spine and bony pelvis with low level metabolic  activity, suspicious for metastatic disease. Largest lesion posteriorly in the left iliac bone measures approximately 1.3 cm on image 113/6 and has an SUV max of 4.9. Incidental CT findings: Multilevel spondylosis with degenerative anterolisthesis at the L4-5 and L5-S1 levels. IMPRESSION: 1. Extensive hypermetabolic nodularity throughout the left pleural space consistent with known pleural metastatic disease. Persistent large left pleural effusion with near complete collapse of the left lung and mediastinal shift to the right. 2. Hypermetabolic mediastinal, left hilar and left internal mammary lymphadenopathy consistent with metastatic disease. No suspicious axillary nodal involvement. 3. Previously demonstrated right pleural and pulmonary nodularity is not associated with significant hypermetabolic activity, although is suboptimally evaluated due to small size. 4. Scattered small sclerotic lesions within the lumbar spine and bony pelvis with low level metabolic activity, suspicious for metastatic disease. 5. No evidence of extra osseous metastatic disease in the abdomen or pelvis. 6.  Aortic Atherosclerosis (ICD10-I70.0). Electronically Signed   By: Elsie Perone M.D.   On: 11/21/2024 11:12   US  THORACENTESIS ASP PLEURAL SPACE W/IMG GUIDE Result Date: 11/13/2024 INDICATION: 73 year old female with history of breast cancer and presents with shortness of breath. Received request for diagnostic and therapeutic thoracentesis. EXAM: ULTRASOUND GUIDED DIAGNOSTIC AND THERAPEUTIC, LEFT-SIDED THORACENTESIS MEDICATIONS: 13 mL 1% lidocaine  COMPLICATIONS: None immediate. PROCEDURE: An ultrasound guided thoracentesis was thoroughly discussed with the patient and questions answered. The benefits, risks, alternatives and complications were also discussed. The patient understands and wishes to proceed with the procedure. Written consent was obtained. Ultrasound was performed to localize and mark an adequate pocket of fluid  in the left chest. The area was then prepped and draped in the normal sterile fashion. 1% lidocaine  was used for local anesthesia. Under ultrasound guidance a 6 Fr Safe-T-Centesis catheter was introduced. Thoracentesis was performed. The catheter was removed and a dressing applied. FINDINGS: A total of approximately 1.2 liters of bloody fluid was removed. Samples were sent to the laboratory as requested by the clinical team. IMPRESSION: Successful ultrasound guided left thoracentesis yielding 1.2 liters of pleural fluid. Performed by: Kristi Davenport, NP under the direct supervision of Dr. Juliene Balder Electronically  Signed   By: Juliene Balder M.D.   On: 11/13/2024 15:46   DG Chest 1 View Result Date: 11/13/2024 CLINICAL DATA:  Status post left thoracentesis EXAM: CHEST  1 VIEW COMPARISON:  Chest radiograph dated 11/12/2024 FINDINGS: Normal lung volumes. Similar opacification of the left lower lung. Large rounded lucency projecting over the lower mediastinum. Decreased large left pleural effusion. No pneumothorax. Heart borders are obscured. No acute osseous abnormality. IMPRESSION: 1. Decreased large left pleural effusion. No pneumothorax. 2. Similar opacification of the left lower lung. 3. Large rounded lucency projecting over the lower mediastinum in keeping with large hiatal hernia. Electronically Signed   By: Limin  Xu M.D.   On: 11/13/2024 14:29   CT Chest W Contrast Result Date: 11/12/2024 EXAM: CT CHEST WITH CONTRAST 11/12/2024 06:03:21 PM TECHNIQUE: CT of the chest was performed with the administration of 75 mL of iohexol  (OMNIPAQUE ) 300 MG/ML solution. Multiplanar reformatted images are provided for review. Automated exposure control, iterative reconstruction, and/or weight based adjustment of the mA/kV was utilized to reduce the radiation dose to as low as reasonably achievable. COMPARISON: Chest radiograph of 11/12/2024. No prior chest CT. CLINICAL HISTORY: Pleural effusion, malignancy suspected.  Prior right sided lumpectomy for breast cancer. * Tracking Code: BO * FINDINGS: MEDIASTINUM: Heart and pericardium are unremarkable. Normal heart size without pericardial effusion. The central airways are clear. Mediastinal shift to the right secondary to the large left pleural effusion. Aortic atherosclerosis (ICD10-I70.0). No central pulmonary embolism on this non-dedicated exam. LYMPH NODES: No supraclavicular adenopathy. Right axillary adenopathy, specifically a nodal mass of 1.5 x 2.0 cm on image 32 / 2. No middle mediastinal or hilar adenopathy. Prominent prevascular nodes including up to 6 mm on image 39 / 2 are indeterminate. LUNGS AND PLEURA: Large left pleural effusion. Dependent irregularity of moderate left pleural thickening including on image 82 / 2. More nodular pleural thickening, and irregularity superiorly including up to 8 mm on image 20 / 2. The majority of the left lung is compressed. Minimal aerated left apex. Tiny right pleural effusion. Nodularity along the right major fissure including on image 47 / 3 is suspicious. There are also subtle parenchymal nodules throughout the right lung at maximally 4 mm on image 83 / 3. SOFT TISSUES/BONES: Osteopenia. Right mastectomy. UPPER ABDOMEN: Large hiatal hernia, with approximately half of the stomach positioned in the lower chest. Organoaxial position / volvulus without complicating obstruction. Possible hepatic steatosis. Normal adrenal glands. IMPRESSION: 1. Large left pleural effusion with pleural nodularity, consistent with pleural metastasis. 2. Tiny right pleural effusion with findings suspicious for right pleural and pulmonary parenchymal metastases including nodularity along the right major fissure and scattered right lung nodules up to 4 mm. 3. Right axillary nodal metastasis in the setting of prior right mastectomy. 4. Large hiatal hernia with approximately half of the stomach positioned in the lower chest. Electronically signed by: Rockey Kilts MD 11/12/2024 06:36 PM EST RP Workstation: [id number]   DG Chest 2 View Result Date: 11/12/2024 CLINICAL DATA:  Shortness of breath EXAM: CHEST - 2 VIEW COMPARISON:  05/19/2022 FINDINGS: Large left-sided pleural effusion with mild shift of mediastinal contents to the right. Only small amount of aerated lung at left apex. Right lung grossly clear. Large air containing structure over the mediastinal silhouette most likely represents hiatal hernia. IMPRESSION: 1. Large left-sided pleural effusion with mild shift of mediastinal contents to the right. 2. Large air containing structure over the mediastinal silhouette most likely represents hiatal hernia. Electronically Signed  By: Luke Bun M.D.   On: 11/12/2024 16:41    Labs:  CBC: Recent Labs    09/18/24 1308 11/12/24 1539 11/13/24 0432 11/19/24 1149  WBC 4.3 5.5 5.5 5.2  HGB 12.0 12.9 12.4 12.7  HCT 37.6 40.8 39.3 40.7  PLT 207 275 236 278    COAGS: Recent Labs    11/12/24 2012  INR 1.0  APTT 27    BMP: Recent Labs    09/18/24 1308 11/12/24 1539 11/13/24 0432 11/19/24 1149  NA 137 139 136 139  K 4.2 4.0 4.3 4.2  CL 105 101 101 102  CO2 24 25 21* 24  GLUCOSE 100* 116* 83 102*  BUN 15 6* 7* 9  CALCIUM  9.0 9.6 9.3 9.4  CREATININE 0.91 0.83 0.68 0.96  GFRNONAA >60 >60 >60 >60    LIVER FUNCTION TESTS: Recent Labs    03/29/24 0939 09/18/24 1308 11/12/24 1539 11/19/24 1149  BILITOT 0.6 0.8 0.4 0.8  AST 22 17 22 23   ALT 15 11 8 13   ALKPHOS 85 85 95 75  PROT 6.8 7.0 7.3 7.5  ALBUMIN 3.6 3.6 4.0 3.5    TUMOR MARKERS: No results for input(s): AFPTM, CEA, CA199, CHROMGRNA in the last 8760 hours.  Assessment and Plan: 73 y.o. female outpatient. Known to IR. History of CLD, HTN, DM, Breast cancer (right) s/p radiation with recurrent left side pleural effusion. Cytology positive for metastatic disease. PET scan shows hypertmatbolic left pleural base lesion. PET scan from 12.3.25 readsThere is  extensive hyper-metabolic nodularity throughout the left pleural space consistent with known pleural metastatic disease. The most intense hypermetabolic activity is located dependently in the lower left hemithorax (SUV max 11.3). Persistent large left pleural effusion with near complete collapse of the left lung. Team is requesting left l=pleural base lesion. Case reviewed and approved by IR Attending Dr. Cordella Banner.   PLAN: IR Image Guided Left Pleural Base Lesion Biopsy   Risks and benefits of left pleural base lesion. was discussed with the patient and/or patient's family including, but not limited to bleeding, infection, damage to adjacent structures or low yield requiring additional tests.  All of the questions were answered and there is agreement to proceed.  Consent signed and in chart.   Thank you for this interesting consult.  I greatly enjoyed meeting TIFFAY PINETTE and look forward to participating in their care.  A copy of this report was sent to the requesting provider on this date.  Electronically Signed: Delon JAYSON Beagle, NP 12/02/2024, 9:30 PM   I spent a total of {New PWEU:[id number]} {New Out-Pt:[id number]}  {Established Out-Pt:[id number]} in face to face in clinical consultation, greater than 50% of which was counseling/coordinating care for ***

## 2024-12-04 ENCOUNTER — Other Ambulatory Visit (HOSPITAL_COMMUNITY): Payer: Self-pay | Admitting: Radiology

## 2024-12-04 ENCOUNTER — Inpatient Hospital Stay: Admitting: Internal Medicine

## 2024-12-04 ENCOUNTER — Telehealth: Payer: Self-pay

## 2024-12-04 DIAGNOSIS — C50811 Malignant neoplasm of overlapping sites of right female breast: Secondary | ICD-10-CM

## 2024-12-04 NOTE — Telephone Encounter (Signed)
 Patient for CT guided Lung mass biopsy on Wed 12/05/24, I called and spoke with the patient on the phone and gave pre-procedure instructions. Pt was made aware to be here at 10a, last dose of ASA 81mg  was Thurs 11/29/24, NPO after MN prior to procedure as well as driver post procedure/recovery/discharge. Pt stated understanding.  Called 12/04/24

## 2024-12-05 ENCOUNTER — Other Ambulatory Visit: Payer: Self-pay

## 2024-12-05 ENCOUNTER — Ambulatory Visit
Admission: RE | Admit: 2024-12-05 | Discharge: 2024-12-05 | Disposition: A | Source: Ambulatory Visit | Attending: Internal Medicine

## 2024-12-05 DIAGNOSIS — C50411 Malignant neoplasm of upper-outer quadrant of right female breast: Secondary | ICD-10-CM | POA: Insufficient documentation

## 2024-12-05 DIAGNOSIS — Z9011 Acquired absence of right breast and nipple: Secondary | ICD-10-CM | POA: Diagnosis not present

## 2024-12-05 DIAGNOSIS — Z87891 Personal history of nicotine dependence: Secondary | ICD-10-CM | POA: Diagnosis not present

## 2024-12-05 DIAGNOSIS — J948 Other specified pleural conditions: Secondary | ICD-10-CM

## 2024-12-05 DIAGNOSIS — Z9221 Personal history of antineoplastic chemotherapy: Secondary | ICD-10-CM | POA: Diagnosis not present

## 2024-12-05 DIAGNOSIS — Z171 Estrogen receptor negative status [ER-]: Secondary | ICD-10-CM

## 2024-12-05 DIAGNOSIS — C782 Secondary malignant neoplasm of pleura: Secondary | ICD-10-CM | POA: Diagnosis not present

## 2024-12-05 LAB — CBC
HCT: 36.4 % (ref 36.0–46.0)
Hemoglobin: 11.5 g/dL — ABNORMAL LOW (ref 12.0–15.0)
MCH: 28.8 pg (ref 26.0–34.0)
MCHC: 31.6 g/dL (ref 30.0–36.0)
MCV: 91 fL (ref 80.0–100.0)
Platelets: 264 K/uL (ref 150–400)
RBC: 4 MIL/uL (ref 3.87–5.11)
RDW: 13.1 % (ref 11.5–15.5)
WBC: 4.4 K/uL (ref 4.0–10.5)
nRBC: 0 % (ref 0.0–0.2)

## 2024-12-05 LAB — PROTIME-INR
INR: 1 (ref 0.8–1.2)
Prothrombin Time: 13.9 s (ref 11.4–15.2)

## 2024-12-05 MED ORDER — FENTANYL CITRATE (PF) 100 MCG/2ML IJ SOLN
INTRAMUSCULAR | Status: AC
  Filled 2024-12-05: qty 2

## 2024-12-05 MED ORDER — SODIUM CHLORIDE 0.9 % IV SOLN: INTRAVENOUS | Status: DC

## 2024-12-05 MED ORDER — MIDAZOLAM HCL (PF) 2 MG/2ML IJ SOLN
INTRAMUSCULAR | Status: AC | PRN
  Administered 2024-12-05: 12:00:00 1 mg via INTRAVENOUS

## 2024-12-05 MED ORDER — MIDAZOLAM HCL 2 MG/2ML IJ SOLN
INTRAMUSCULAR | Status: AC
  Filled 2024-12-05: qty 2

## 2024-12-05 MED ORDER — FENTANYL CITRATE (PF) 100 MCG/2ML IJ SOLN
INTRAMUSCULAR | Status: AC | PRN
  Administered 2024-12-05: 12:00:00 50 ug via INTRAVENOUS

## 2024-12-05 NOTE — Progress Notes (Signed)
 Patient clinically stable post CT lung biopsy per DR Jenna, tolerated well. Denies complaints shortness of breath nor pain post procedure. Received  Versed  1 mg along with Fentanyl  50 mcg IV for procedure. Family at bedside with discharge instructions given post procedure with questions answered. Has return appointment already scheduled per Dr Rennie post  biopsy.

## 2024-12-05 NOTE — H&P (Signed)
 Chief Complaint:  Left Pleural Lesions  Procedure: Lung Lesion Biopsy with possible thoracentesis  Referring Provider(s): Dr. KANDICE Joe  Supervising Physician: Jenna Hacker  Patient Status: [facility] - Out-pt  History of Present Illness: Morgan Peterson is a 73 y.o. 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 She was recently admitted to the hospital for worsening shortness of breath and pleural effusion at which time she underwent a thoracentesis in IR. Unfortunately, cytology from the thoracentesis was positive for metastatic breast carcinoma and CT imaging at that time revealed pleural nodularity consistent with metastases as well as scattered right lung nodules measuring up to 4mm. Follow up PET scan confirmed hypermetabolic nodularity throughout the left pleural space and patient was referred back to IR for biopsy of the left lung nodules.   She presents today with family. States that she has been feeling well overall. Feels that her shortness of breath has improved since her last thoracentesis. She denies any chest pain, difficulty breathing, fevers/chills, abdominal pain, nausea/vomiting, or weight loss. NPO since midnight. Reports she has not taken her ASA in the past month. All questions and concerns answered at the bedside.   Patient is Full Code  Past Medical History:  Diagnosis Date   Appetite loss    Asthma    Cervical radiculopathy    Chemotherapy-induced nausea    Hiatal hernia    Hypercholesterolemia    Hyperlipidemia    Hypertension    IBS (irritable bowel syndrome)    Malignant neoplasm of upper-outer quadrant of female breast (HCC) 04/11/2009   Right breast, invasive ductal carcinoma, 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 Arimidex therapy completed September 2015   Mild  mitral valve prolapse    Neuropathy    feet, from chemo   Personal history of malignant neoplasm of breast 2010   Personal history of radiation therapy 2010   mammosite   T2DM (type 2 diabetes mellitus) (HCC) 2011    Past Surgical History:  Procedure Laterality Date   ABDOMINAL EXPLORATION SURGERY  2000   ovarian cyst   BREAST BIOPSY Right 2010   +    BREAST BIOPSY Left 04/06/2023   Left Breast Stereo Bx X Clip - path pending   BREAST BIOPSY Left 04/06/2023   Left Breast Stereo Bx, Coil Clip - path pending   BREAST BIOPSY Left 04/06/2023   MM LT BREAST BX W LOC DEV 1ST LESION IMAGE BX SPEC STEREO GUIDE 04/06/2023 [facility]   BREAST BIOPSY Left 04/06/2023   MM LT BREAST BX W LOC DEV EA AD LESION IMG BX SPEC STEREO GUIDE 04/06/2023 [facility]   BREAST EXCISIONAL BIOPSY Right 2010   + mammo site inasive mammo ca   BREAST LUMPECTOMY Right 2010   [facility]   COLONOSCOPY  07/19/2012   Normal exam, Dr. Dessa   COLONOSCOPY WITH PROPOFOL  N/A 04/21/2022   Procedure: COLONOSCOPY WITH PROPOFOL ;  Surgeon: Dessa Reyes ORN, MD;  Location: [facility];  Service: Endoscopy;  Laterality: N/A;   ESOPHAGOGASTRODUODENOSCOPY (EGD) WITH PROPOFOL  N/A 10/22/2022   Procedure: ESOPHAGOGASTRODUODENOSCOPY (EGD) WITH PROPOFOL ;  Surgeon: Jinny Carmine, MD;  Location: [facility];  Service: Endoscopy;  Laterality: N/A;  Diabetic   MASTECTOMY Right 2010   partial/ lumpectomy   PORTACATH PLACEMENT Left 05/19/2022   Procedure: INSERTION PORT-A-CATH;  Surgeon: Dessa Reyes ORN,  MD;  Location: [facility] ORS;  Service: General;  Laterality: Left;   SIMPLE MASTECTOMY WITH AXILLARY SENTINEL NODE BIOPSY Right 04/26/2022   Procedure: SIMPLE MASTECTOMY WITH AXILLARY SENTINEL NODE BIOPSY;  Surgeon: Dessa Reyes ORN, MD;  Location: [facility];  Service: General;  Laterality: Right;  RNFA to assist   TUBAL LIGATION  20 years ago    Allergies: Levaquin [levofloxacin in d5w], Losartan, Metoprolol,  Saxagliptin, Amlodipine-olmesartan, Canagliflozin, Egg white [egg protein (egg white)], Gabapentin , Glipizide, Gluten meal, Lisinopril, Metformin  and related, Milk (cow), Milk-related compounds, Shrimp extract, Tramadol, Zyprexa  [olanzapine ], Actos [pioglitazone], Atorvastatin , Carvedilol , and Prednisone   Medications: Prior to Admission medications  Medication Sig Start Date End Date Taking? Authorizing Provider  aspirin  EC 81 MG tablet Take 1 tablet (81 mg total) by mouth daily. Patient taking differently: Take 81 mg by mouth 3 (three) times a week. 08/18/17   Sowles, [name], MD  atenolol  (TENORMIN ) 25 MG tablet Take 12.5 mg by mouth daily.    [provider]  Azelastine  HCl 137 MCG/SPRAY SOLN PLACE 1 SPRAY INTO BOTH NOSTRILS 2 (TWO) TIMES DAILY. USE IN EACH NOSTRIL AS DIRECTED 11/01/24   [name], [name], MD  betamethasone dipropionate 0.05 % lotion Apply 1 Application topically daily. 01/02/24   [provider]  betamethasone, augmented, (DIPROLENE) 0.05 % lotion Apply 1 application  topically 2 (two) times daily. 11/05/21   [provider]  Blood Glucose Monitoring Suppl (ONETOUCH VERIO) w/Device KIT 1 Device by Does not apply route once. 01/12/16   Harrie Pump, MD  Cyanocobalamin (VITAMIN B-12 PO) Take 1 tablet by mouth daily.    [provider]  diazepam  (VALIUM ) 2 MG tablet One pill 45-60 mins prior to procedure; 1 pill 15 mins prior if needed. 11/20/24   Brahmanday, Govinda R, MD  EPINEPHrine  (EPIPEN  2-PAK) 0.3 mg/0.3 mL IJ SOAJ injection Inject 0.3 mg into the muscle as needed for anaphylaxis. 03/13/24   Sowles, [name], MD  fexofenadine (ALLEGRA) 180 MG tablet Take 180 mg by mouth daily.    [provider]  glucose blood (ONETOUCH VERIO) test strip Use as instructed 02/13/18   Sowles, [name], MD  ketoconazole (NIZORAL) 2 % shampoo Apply 1 Application topically 3 (three) times a week. 01/02/24   [provider]  Lancets JANETT  ULTRASOFT) lancets Use as instructed 01/07/16   Harrie Pump, MD  levocetirizine (XYZAL ) 5 MG tablet TAKE 1 TABLET (5 MG TOTAL) BY MOUTH DAILY. 06/14/24   Sowles, [name], MD  minoxidil  (LONITEN ) 2.5 MG tablet Take 2.5 mg by mouth daily. 01/02/24   [provider]  montelukast  (SINGULAIR ) 10 MG tablet Take 1 tablet (10 mg total) by mouth at bedtime. 09/13/24   Sowles, [name], MD  Multiple Vitamin (MULTIVITAMIN WITH MINERALS) TABS tablet Take 1 tablet by mouth 2 (two) times a week.    [provider]  omeprazole  (PRILOSEC) 20 MG capsule Take 1 capsule (20 mg total) by mouth daily. Patient taking differently: Take 40 mg by mouth daily. 09/13/24   Sowles, [name], MD  OZEMPIC, 0.25 OR 0.5 MG/DOSE, 2 MG/1.5ML SOPN Inject 0.5 mg into the skin every Sunday. 01/16/19   Cherilyn Debby CROME, MD  OZEMPIC, 0.25 OR 0.5 MG/DOSE, 2 MG/3ML SOPN Inject 0.5 mg into the skin every 7 (seven) days.    [provider]  polyethylene glycol (MIRALAX  / GLYCOLAX ) 17 g packet Take 17 g by mouth daily.    [provider]  prednisoLONE acetate (PRED FORTE) 1 % ophthalmic suspension Place 1 drop into the  left eye. 12/08/23   [provider]  rosuvastatin  (CRESTOR ) 20 MG tablet Take 1 tablet (20 mg total) by mouth every morning. 09/13/24   Sowles, [name], MD  spironolactone  (ALDACTONE ) 25 MG tablet Take 1 tablet (25 mg total) by mouth daily. Patient taking differently: Take 12.5 mg by mouth daily. 09/13/24   Sowles, [name], MD  Vitamin D , Ergocalciferol , (DRISDOL ) 1.25 MG (50000 UNIT) CAPS capsule Take 50,000 Units by mouth once a week. 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  Tobacco Use   Smoking status: Former    Current packs/day: 0.00    Average packs/day: 0.3 packs/day for 10.0 years (2.5 ttl pk-yrs)    Types: Cigarettes    Start date: 12    Quit date: 1988    Years since quitting: 37.9   Smokeless tobacco: Never   Tobacco comments:    smoking cessation materials not required  Vaping Use   Vaping status: Never Used  Substance and Sexual Activity   Alcohol use: Yes    Comment: wine occ   Drug use: No   Sexual activity: Not Currently    Comment: husband has ED  Other Topics Concern   Not on file  Social History Narrative   Oncology nurse on the floor retired.;  Husband retired from [employer].  No smoking.   Social Drivers of Health   Tobacco Use: Medium Risk (12/05/2024)   Patient History    Smoking Tobacco Use: Former    Smokeless Tobacco Use: Never    Passive Exposure: Not on file  Financial Resource Strain: Low Risk (04/12/2024)   Overall Financial Resource Strain (CARDIA)    Difficulty of Paying Living Expenses: Not hard at all  Food Insecurity: No Food Insecurity (11/13/2024)   Epic    Worried About Programme Researcher, Broadcasting/film/video in the Last Year: Never true    Ran Out of Food in the Last Year: Never true  Transportation Needs: No Transportation Needs (11/13/2024)   Epic    Lack of Transportation (Medical): No    Lack of Transportation (Non-Medical): No  Physical Activity: Sufficiently Active (04/12/2024)   Exercise Vital Sign    Days of Exercise per Week: 3 days    Minutes of Exercise per Session: 60 min  Stress: No Stress Concern Present (04/12/2024)   [employer] of Occupational Health - Occupational Stress Questionnaire    Feeling of Stress : Not at all  Social Connections:  Unknown (11/13/2024)   Social Connection and Isolation Panel    Frequency of Communication with Friends and Family: More than three times a week    Frequency of Social Gatherings with Friends and Family: Twice a week    Attends Religious Services: Not on Insurance Claims Handler of Clubs or Organizations: No  Attends Banker Meetings: Never    Marital Status: Married  Depression (PHQ2-9): Low Risk (11/19/2024)   Depression (PHQ2-9)    PHQ-2 Score: 0  Alcohol Screen: Low Risk (04/12/2024)   Alcohol Screen    Last Alcohol Screening Score (AUDIT): 0  Housing: Low Risk (11/13/2024)   Epic    Unable to Pay for Housing in the Last Year: No    Number of Times Moved in the Last Year: 0    Homeless in the Last Year: No  Utilities: Not At Risk (11/13/2024)   Epic    Threatened with loss of utilities: No  Health Literacy: Adequate Health Literacy (04/12/2024)   B1300 Health Literacy    Frequency of need for help with medical instructions: Never    Review of Systems Patient denies any headache, chest pain, shortness of breath, abdominal pain, N/V, or fever/chills. All other systems are negative.   Vital Signs: BP (!) 116/93   Pulse 83   Temp 98.5 F (36.9 C) (Oral)   Resp (!) 25   Ht 5' 6 (1.676 m)   Wt 167 lb (75.8 kg)   SpO2 94%   BMI 26.95 kg/m    Physical Exam Vitals reviewed.  Constitutional:      Appearance: Normal appearance.  HENT:     Mouth/Throat:     Mouth: Mucous membranes are moist.     Pharynx: Oropharynx is clear.  Cardiovascular:     Rate and Rhythm: Normal rate and regular rhythm.     Heart sounds: Normal heart sounds.  Pulmonary:     Effort: Pulmonary effort is normal.     Breath sounds: Normal breath sounds.  Abdominal:     General: Abdomen is flat.     Palpations: Abdomen is soft.     Tenderness: There is no abdominal tenderness.  Skin:    General: Skin is warm and dry.  Neurological:     Mental Status: She is alert and oriented to  person, place, and time.  Psychiatric:        Behavior: Behavior normal.     Imaging: US  THORACENTESIS ASP PLEURAL SPACE W/IMG GUIDE Result Date: 11/28/2024 INDICATION: 73 year old female with a history of breast cancer and recurrent left-sided pleural effusion. Request for therapeutic thoracentesis. EXAM: ULTRASOUND GUIDED THERAPEUTIC, LEFT-SIDED THORACENTESIS MEDICATIONS: 1% lidocaine , 8 mL. COMPLICATIONS: None immediate. PROCEDURE: An ultrasound guided thoracentesis was thoroughly discussed with the patient and questions answered. The benefits, risks, alternatives and complications were also discussed. The patient understands and wishes to proceed with the procedure. Written consent was obtained. Ultrasound was performed to localize and mark an adequate pocket of fluid in the left chest. The area was then prepped and draped in the normal sterile fashion. 1% Lidocaine  was used for local anesthesia. Under ultrasound guidance a 6 Fr Safe-T-Centesis catheter was introduced. Thoracentesis was performed. The catheter was removed and a dressing applied. FINDINGS: A total of approximately 1.4 L of bloody fluid was removed. IMPRESSION: Successful ultrasound guided left-sided thoracentesis yielding 1.4 L of pleural fluid. Procedure performed by: Sherrilee Bal, PA-C under the supervision of Dr. VEAR Lent Electronically Signed   By: Wilkie Lent M.D.   On: 11/28/2024 12:20   DG Chest Port 1 View Result Date: 11/28/2024 CLINICAL DATA:  Status post left thoracentesis. EXAM: PORTABLE CHEST 1 VIEW COMPARISON:  11/13/2024 FINDINGS: Stable large air containing hiatal hernia. Moderate-sized left pleural effusion, decreased following thoracentesis. No pneumothorax. Mildly improved left basilar atelectasis. Minimal right pleural effusion. Clear right  lung. Normal-sized heart. Thoracolumbar spine degenerative changes and mild scoliosis. IMPRESSION: 1. Moderate-sized left pleural effusion, decreased following  thoracentesis. 2. No pneumothorax. 3. Mildly improved left basilar atelectasis. 4. Minimal right pleural effusion. 5. Stable large hiatal hernia. Electronically Signed   By: Elspeth Bathe M.D.   On: 11/28/2024 11:49   NM PET Image Initial (PI) Skull Base To Thigh (F-18 FDG) Result Date: 11/21/2024 CLINICAL DATA:  Subsequent treatment strategy for right breast cancer. Metastatic disease on recent left thoracentesis. EXAM: NUCLEAR MEDICINE PET SKULL BASE TO THIGH TECHNIQUE: 9.27 mCi F-18 FDG was injected intravenously. Full-ring PET imaging was performed from the skull base to thigh after the radiotracer. CT data was obtained and used for attenuation correction and anatomic localization. Fasting blood glucose: 110 mg/dl COMPARISON:  Chest CT 88/75/7974 FINDINGS: Mediastinal blood pool activity: SUV max 2.6 NECK: No hypermetabolic cervical lymph nodes are identified. No suspicious activity identified within the pharyngeal mucosal space. Incidental CT findings: none CHEST: There is extensive hypermetabolic nodularity throughout the left pleural space consistent with known pleural metastatic disease. The most intense hypermetabolic activity is located dependently in the lower left hemithorax (SUV max 11.3). Persistent large left pleural effusion with near complete collapse of the left lung. Trace right pleural effusion without right pleural hypermetabolic activity. The small pleural-based and parenchymal nodules in the right hemithorax seen on previous CT are too small to evaluate by PET-CT. There are multiple hypermetabolic mediastinal, left hilar and left internal mammary lymph nodes. Representative nodes include in AP window node with an SUV max of 9.9 and a left hilar node with an SUV max of 12.5. Previously noted 2.2 x 1.4 cm right axillary nodal mass on image 41/6 demonstrates only low level metabolic activity (SUV max 4.5), and may be treatment related. There is no hypermetabolic left axillary adenopathy.  Incidental CT findings: Unchanged mediastinal shift to the right attributed to the large left pleural effusion. Moderate to large hiatal hernia. Mild aortic atherosclerosis. Status post right mastectomy without evidence of chest wall recurrence. ABDOMEN/PELVIS: There is no hypermetabolic activity within the liver, adrenal glands, spleen or pancreas. Scattered small retroperitoneal lymph nodes with low level metabolic activity, likely physiologic. Bowel activity within physiologic limits. No suspicious adnexal findings. Incidental CT findings: Mild aortoiliac atherosclerosis. No ascites or peritoneal nodularity. SKELETON: Small lytic lesion anteriorly in the T7 vertebral body demonstrates mild hypermetabolic activity (SUV max 5.3). There are scattered sclerotic lesions within the lumbar spine and bony pelvis with low level metabolic activity, suspicious for metastatic disease. Largest lesion posteriorly in the left iliac bone measures approximately 1.3 cm on image 113/6 and has an SUV max of 4.9. Incidental CT findings: Multilevel spondylosis with degenerative anterolisthesis at the L4-5 and L5-S1 levels. IMPRESSION: 1. Extensive hypermetabolic nodularity throughout the left pleural space consistent with known pleural metastatic disease. Persistent large left pleural effusion with near complete collapse of the left lung and mediastinal shift to the right. 2. Hypermetabolic mediastinal, left hilar and left internal mammary lymphadenopathy consistent with metastatic disease. No suspicious axillary nodal involvement. 3. Previously demonstrated right pleural and pulmonary nodularity is not associated with significant hypermetabolic activity, although is suboptimally evaluated due to small size. 4. Scattered small sclerotic lesions within the lumbar spine and bony pelvis with low level metabolic activity, suspicious for metastatic disease. 5. No evidence of extra osseous metastatic disease in the abdomen or pelvis. 6.   Aortic Atherosclerosis (ICD10-I70.0). Electronically Signed   By: Elsie Perone M.D.   On: 11/21/2024 11:12  US  THORACENTESIS ASP PLEURAL SPACE W/IMG GUIDE Result Date: 11/13/2024 INDICATION: 73 year old female with history of breast cancer and presents with shortness of breath. Received request for diagnostic and therapeutic thoracentesis. EXAM: ULTRASOUND GUIDED DIAGNOSTIC AND THERAPEUTIC, LEFT-SIDED THORACENTESIS MEDICATIONS: 13 mL 1% lidocaine  COMPLICATIONS: None immediate. PROCEDURE: An ultrasound guided thoracentesis was thoroughly discussed with the patient and questions answered. The benefits, risks, alternatives and complications were also discussed. The patient understands and wishes to proceed with the procedure. Written consent was obtained. Ultrasound was performed to localize and mark an adequate pocket of fluid in the left chest. The area was then prepped and draped in the normal sterile fashion. 1% lidocaine  was used for local anesthesia. Under ultrasound guidance a 6 Fr Safe-T-Centesis catheter was introduced. Thoracentesis was performed. The catheter was removed and a dressing applied. FINDINGS: A total of approximately 1.2 liters of bloody fluid was removed. Samples were sent to the laboratory as requested by the clinical team. IMPRESSION: Successful ultrasound guided left thoracentesis yielding 1.2 liters of pleural fluid. Performed by: Kristi Davenport, NP under the direct supervision of Dr. Juliene Balder Electronically Signed   By: Juliene Balder M.D.   On: 11/13/2024 15:46   DG Chest 1 View Result Date: 11/13/2024 CLINICAL DATA:  Status post left thoracentesis EXAM: CHEST  1 VIEW COMPARISON:  Chest radiograph dated 11/12/2024 FINDINGS: Normal lung volumes. Similar opacification of the left lower lung. Large rounded lucency projecting over the lower mediastinum. Decreased large left pleural effusion. No pneumothorax. Heart borders are obscured. No acute osseous abnormality. IMPRESSION: 1.  Decreased large left pleural effusion. No pneumothorax. 2. Similar opacification of the left lower lung. 3. Large rounded lucency projecting over the lower mediastinum in keeping with large hiatal hernia. Electronically Signed   By: Limin  Xu M.D.   On: 11/13/2024 14:29   CT Chest W Contrast Result Date: 11/12/2024 EXAM: CT CHEST WITH CONTRAST 11/12/2024 06:03:21 PM TECHNIQUE: CT of the chest was performed with the administration of 75 mL of iohexol  (OMNIPAQUE ) 300 MG/ML solution. Multiplanar reformatted images are provided for review. Automated exposure control, iterative reconstruction, and/or weight based adjustment of the mA/kV was utilized to reduce the radiation dose to as low as reasonably achievable. COMPARISON: Chest radiograph of 11/12/2024. No prior chest CT. CLINICAL HISTORY: Pleural effusion, malignancy suspected. Prior right sided lumpectomy for breast cancer. * Tracking Code: BO * FINDINGS: MEDIASTINUM: Heart and pericardium are unremarkable. Normal heart size without pericardial effusion. The central airways are clear. Mediastinal shift to the right secondary to the large left pleural effusion. Aortic atherosclerosis (ICD10-I70.0). No central pulmonary embolism on this non-dedicated exam. LYMPH NODES: No supraclavicular adenopathy. Right axillary adenopathy, specifically a nodal mass of 1.5 x 2.0 cm on image 32 / 2. No middle mediastinal or hilar adenopathy. Prominent prevascular nodes including up to 6 mm on image 39 / 2 are indeterminate. LUNGS AND PLEURA: Large left pleural effusion. Dependent irregularity of moderate left pleural thickening including on image 82 / 2. More nodular pleural thickening, and irregularity superiorly including up to 8 mm on image 20 / 2. The majority of the left lung is compressed. Minimal aerated left apex. Tiny right pleural effusion. Nodularity along the right major fissure including on image 47 / 3 is suspicious. There are also subtle parenchymal nodules  throughout the right lung at maximally 4 mm on image 83 / 3. SOFT TISSUES/BONES: Osteopenia. Right mastectomy. UPPER ABDOMEN: Large hiatal hernia, with approximately half of the stomach positioned in the lower chest.  Organoaxial position / volvulus without complicating obstruction. Possible hepatic steatosis. Normal adrenal glands. IMPRESSION: 1. Large left pleural effusion with pleural nodularity, consistent with pleural metastasis. 2. Tiny right pleural effusion with findings suspicious for right pleural and pulmonary parenchymal metastases including nodularity along the right major fissure and scattered right lung nodules up to 4 mm. 3. Right axillary nodal metastasis in the setting of prior right mastectomy. 4. Large hiatal hernia with approximately half of the stomach positioned in the lower chest. Electronically signed by: Rockey Kilts MD 11/12/2024 06:36 PM EST RP Workstation: [id number]   DG Chest 2 View Result Date: 11/12/2024 CLINICAL DATA:  Shortness of breath EXAM: CHEST - 2 VIEW COMPARISON:  05/19/2022 FINDINGS: Large left-sided pleural effusion with mild shift of mediastinal contents to the right. Only small amount of aerated lung at left apex. Right lung grossly clear. Large air containing structure over the mediastinal silhouette most likely represents hiatal hernia. IMPRESSION: 1. Large left-sided pleural effusion with mild shift of mediastinal contents to the right. 2. Large air containing structure over the mediastinal silhouette most likely represents hiatal hernia. Electronically Signed   By: Luke Bun M.D.   On: 11/12/2024 16:41    Labs:  CBC: Recent Labs    11/12/24 1539 11/13/24 0432 11/19/24 1149 12/05/24 1030  WBC 5.5 5.5 5.2 4.4  HGB 12.9 12.4 12.7 11.5*  HCT 40.8 39.3 40.7 36.4  PLT 275 236 278 264    COAGS: Recent Labs    11/12/24 2012  INR 1.0  APTT 27    BMP: Recent Labs    09/18/24 1308 11/12/24 1539 11/13/24 0432 11/19/24 1149  NA 137 139 136  139  K 4.2 4.0 4.3 4.2  CL 105 101 101 102  CO2 24 25 21* 24  GLUCOSE 100* 116* 83 102*  BUN 15 6* 7* 9  CALCIUM  9.0 9.6 9.3 9.4  CREATININE 0.91 0.83 0.68 0.96  GFRNONAA >60 >60 >60 >60    LIVER FUNCTION TESTS: Recent Labs    03/29/24 0939 09/18/24 1308 11/12/24 1539 11/19/24 1149  BILITOT 0.6 0.8 0.4 0.8  AST 22 17 22 23   ALT 15 11 8 13   ALKPHOS 85 85 95 75  PROT 6.8 7.0 7.3 7.5  ALBUMIN 3.6 3.6 4.0 3.5    TUMOR MARKERS: No results for input(s): AFPTM, CEA, CA199, CHROMGRNA in the last 8760 hours.  Assessment and Plan:  Metastatic Right Breast Cancer with Lung Nodules: Morgan Peterson is a 73 y.o. female with a history of Stage II triple negative right sided breast cancer s/p treatment with recent findings concerning for metastases. She presents to [facility] department for an image-guided lung lesion biopsy with Dr. KANDICE Banner. Procedure to be performed under moderate sedation.  -NPO since midnight -Not on ASA -INR pending -Plan for CT lung lesion biopsy with Dr. Banner   Risks and benefits of lung lesion biopsy was discussed with the patient and/or patient's family including, but not limited to bleeding, infection, damage to adjacent structures or low yield requiring additional tests.  All of the questions were answered and there is agreement to proceed.  Consent signed and in chart.   Thank you for allowing our service to participate in Morgan Peterson 's care.    Electronically Signed: Tanique Matney M Roni Scow, PA-C   12/05/2024, 11:01 AM     I spent a total of 25 Minutes in face to face in clinical consultation, greater than 50% of which was counseling/coordinating care for  left pleural lesion biopsy.

## 2024-12-05 NOTE — Procedures (Signed)
 Pre procedural Dx: Left pleural thickening    Post procedural Dx: Same  Technically successful CT guided biopsy of left pleura   EBL: None.   Complications: None immediate.   KANDICE Banner, MD Pager #: [phone] \

## 2024-12-05 NOTE — Progress Notes (Signed)
 Patient has remained clinically stable post CT lung biopsy per Dr Jenna, has had no complaints post procedure. Taking po's without difficulty, oxygen sats on room air have remained stable. Ready for discharge per orders.

## 2024-12-06 ENCOUNTER — Telehealth: Payer: Self-pay | Admitting: *Deleted

## 2024-12-06 DIAGNOSIS — E1169 Type 2 diabetes mellitus with other specified complication: Secondary | ICD-10-CM

## 2024-12-06 DIAGNOSIS — Z171 Estrogen receptor negative status [ER-]: Secondary | ICD-10-CM

## 2024-12-06 DIAGNOSIS — J90 Pleural effusion, not elsewhere classified: Secondary | ICD-10-CM

## 2024-12-06 LAB — GLUCOSE, CAPILLARY
Glucose-Capillary: 109 mg/dL — ABNORMAL HIGH (ref 70–99)
Glucose-Capillary: 88 mg/dL (ref 70–99)

## 2024-12-06 MED ORDER — BENZONATATE 100 MG PO CAPS: 100.0000 mg | ORAL_CAPSULE | Freq: Three times a day (TID) | ORAL | 0 refills | Status: DC | PRN

## 2024-12-06 NOTE — Telephone Encounter (Signed)
 Patient requesting prescription for Tessalon  Perle to help with her chronic cough r/t to her h/o pleural effusion/cancer.    NURSING SYMPTOM TRIAGE ASSESSMENT & DISPOSITION  Mode of interaction:  Telephone Date of symptom triage interaction:  12/06/2024 Time of symptom triage interaction:  [id number]  Cancer diagnosis:  Malignant neoplasm of overlapping sites of right breast in female, estrogen receptor negative (HCC)  Pleural effusion  S/p CT guided Lung biopsy on 12/05/24 and U/S Thora on 11/19/24  Last treatment date:  2024 Oral chemotherapy:  No  Symptom Triage Protocol:  Cough   History of the problem:  Cough Attempt to obtain patient reported VS: Home Pulse Oximetry:  does not have a pulse ox, Respiratory rate:  20, Heart Rate:  83, Blood Pressure:  has not taken  Adventitious respiratory sounds heard by phone: none  Associated symptoms: Reports some mucus production but can not cough anything up.  Patient reported cyanosis or pallor: No  Timing of symptoms: Intermittent cough throughout the day  Aggravating factors: none  Are these acute or chronic symptoms: chronic  Potential environmental irritants: Sometimes fragrances set me off coughing  Potential exposure to illness: No  Changes in ADLs: none     Smoking History: Never smoker     Additional commentary:     Triage Nurse Guidance Signs and Symptoms Action  Sudden, unexpected increase in dyspnea at rest Chest pain Frothy pink sputum or gross hemoptysis Facial swelling Change in mental status Seek emergency care immediately.   Increasing dyspnea with activity Non-neutropenic fever Increased edema or swelling Change in cough or sputum production Uncontrollable cough Wheezing, rhonchi, or crackles Seek medical care within 24 hours.   Subacute or chronic cough, lasting more than three weeks Follow homecare instructions and seek medical care if no improvement within 24-48 hours.   Provider  Consulted  Provider name and credentials:   Provider instruction:    Patient Instruction  Disclaimer:  Patient specific and [employer] instruction provided verbally and sent through MyChart for active users.     Comply with medical therapy (e.g., antibiotics, antitussives, bronchodilators, opioids, proton pump inhibitors), respiratory treatment, and oxygen, as prescribed. Drink plenty (1-2 L) of fluids (unless restricted because of cardiac dysfunction or kidney disease) to help thin out secretions (unless underlying congestive heart failure is present). Avoid precipitating factors that worsen cough (e.g., perfumes, tobacco smoke, cold or dry air). Consider use of a warm humidifier (cold humidified air may lead to broncho-spasm). Monitor for fever or any sign of infection, and avoid contact with individuals who are sick.  Report the Following Problems: Fever (temperature higher than 101.33F [38.6C], or 100.25F [38.1C] if receiving chemotherapy) Change in sputum production (color, hemoptysis)  Swelling of the feet or hands  Unrelieved heartburn symptoms, if applicable  Seek Emergency Care Immediately if Any of the Following Occurs: Worsening dyspnea, especially if accompanied by chest pain or gross hemoptysis  Increased work of breathing (elevated respiratory or heart rate)  Teach Back Method used:  Yes   Nurse Triage Priority:  Non-urgent (review and reinforce self-care strategies)  Barriers to Care:  None identified  Nurse Triage Disposition:  Home care, return call if no improvement within 24 hours  Protocol Source:  Ruthine HERO., & Eldonna CANDIE Pee.). (2019). Telephone triage for oncology nurses (3rd ed.). Oncology Nursing Society.

## 2024-12-08 LAB — SURGICAL PATHOLOGY

## 2024-12-11 ENCOUNTER — Telehealth: Payer: Self-pay | Admitting: Pharmacy Technician

## 2024-12-11 ENCOUNTER — Encounter: Payer: Self-pay | Admitting: Internal Medicine

## 2024-12-11 ENCOUNTER — Other Ambulatory Visit (HOSPITAL_COMMUNITY): Payer: Self-pay

## 2024-12-11 ENCOUNTER — Other Ambulatory Visit: Payer: Self-pay

## 2024-12-11 ENCOUNTER — Inpatient Hospital Stay: Admitting: Internal Medicine

## 2024-12-11 ENCOUNTER — Inpatient Hospital Stay: Admitting: Pharmacist

## 2024-12-11 ENCOUNTER — Other Ambulatory Visit: Payer: Self-pay | Admitting: Pharmacy Technician

## 2024-12-11 VITALS — BP 124/75 | HR 117 | Temp 97.5°F | Resp 16 | Ht 66.0 in | Wt 166.7 lb

## 2024-12-11 DIAGNOSIS — C50411 Malignant neoplasm of upper-outer quadrant of right female breast: Secondary | ICD-10-CM

## 2024-12-11 DIAGNOSIS — Z853 Personal history of malignant neoplasm of breast: Secondary | ICD-10-CM | POA: Diagnosis not present

## 2024-12-11 DIAGNOSIS — Z171 Estrogen receptor negative status [ER-]: Secondary | ICD-10-CM

## 2024-12-11 MED ORDER — RIBOCICLIB SUCC (600 MG DOSE) 200 MG PO TBPK
600.0000 mg | ORAL_TABLET | Freq: Every day | ORAL | 1 refills | Status: DC
  Filled 2024-12-11: qty 63, 28d supply, fill #0
  Filled 2025-01-08 (×2): qty 63, 28d supply, fill #1

## 2024-12-11 MED ORDER — PROCHLORPERAZINE MALEATE 10 MG PO TABS: 10.0000 mg | ORAL_TABLET | Freq: Four times a day (QID) | ORAL | 2 refills | Status: AC | PRN

## 2024-12-11 MED ORDER — LETROZOLE 2.5 MG PO TABS: 2.5000 mg | ORAL_TABLET | Freq: Every day | ORAL | 1 refills | Status: AC

## 2024-12-11 NOTE — Assessment & Plan Note (Addendum)
#   NOV 2025- CT chest-  Large left pleural effusion with pleural nodularity, consistent with pleural metastasis; Tiny right pleural effusion with findings suspicious for right pleural and pulmonary parenchymal metastases including nodularity along the right major fissure and scattered right lung nodules up to 4 mm; Right axillary nodal metastasis in the setting of prior right mastectomy.  Recommend PET scan ASAP.  Also discussed regarding possible need for a biopsy of the right axillary lymph node based upon the results of PET scan.Pleura, biopsy:  METASTATIC ADENOCARCINOMA, CONSISTENT WITH PATIENT'S CLINICAL HISTORY OF       PRIMARY BREAST CARCINOMA,  The tumor cells are NEGATIVE for Her2 (0). Estrogen Receptor:  100%, POSITIVE, Progesterone Receptor:  80%, POSITIVE Proliferation Marker Ki67:  20%. DEC 2025- PET scan: Extensive hypermetabolic nodularity throughout the left pleural space consistent with known pleural metastatic disease. Persistent large left pleural effusion with near complete collapse of the left lung and mediastinal shift to the right. Hypermetabolic mediastinal, left hilar and left internal mammary lymphadenopathy consistent with metastatic disease. No suspicious axillary nodal involvement.  Previously demonstrated right pleural and pulmonary nodularity is not associated with significant hypermetabolic activity, although is suboptimally evaluated due to small size. Scattered small sclerotic lesions within the lumbar spine and bony pelvis with low level metabolic activity, suspicious for metastatic disease. No evidence of extra osseous metastatic disease in the abdomen or pelvis.  # I reviewed the pathology and stage-as IV with the patient and family.  Discussed unfortunately her malignancy is not curable.  However treatments-could potentially help control the disease and help her live longer.  Life expectancy is usually in the order of years.  In general treatments are indefinite; until  progression of disease or intolerance. # Recommend checking NGS   # Recommend starting patient on letrozole ; along with CDK- inhibitors. I discussed the mechanism of action of aromatase inhibitors-with blocking of estrogen to prevent breast cancer.  Also discussed the potential side effects including but not limited to arthralgias hot flashes and increased risk of osteoporosis.  I discussed regarding Ribociclib  3 weeks on 1 week off along with antiestrogen therapy.  I reviewed the potential side effects including but not limited to nausea vomiting diarrhea skin rash liver and kidney abnormalities; and also neutropenia.  I discussed with pharmacy-  EKG at baseline- NOV 2025- WNL;  and also after starting treatment.  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  Monitor blood sugars closely. Stable  # Bone health- April 2024- BMD measured at Femur Neck Right is 0.879 g/cm2 with a T-score of -1.1.  # #Incidental findings on Imaging  CT , 2025: History of chronic -large hiatal hernia with approximately half of the stomach positioned in the lower chest. I reviewed/discussed/counseled the patient.    # IV access- s/p  Explantation-PIV.   # DISPOSITION: # Follow up in  in 3 weeks- MD; labs- cbc/cmp- EKG-Dr.B  # I reviewed the blood work- with the patient in detail; also reviewed the imaging independently [as summarized above]; and with the patient in detail.   # 40 minutes face-to-face with the patient discussing the above plan of care; more than 50% of time spent on prognosis/ natural history; counseling and coordination.

## 2024-12-11 NOTE — Progress Notes (Signed)
 PET 11/21/24, lung bx 12/05/24, thoracentesis 11/21/24, chest xray 11/28/24.  Is it ok to drink the sour sop tea?

## 2024-12-11 NOTE — Telephone Encounter (Signed)
 Oral Oncology Patient Advocate Encounter   New authorization   Received notification that prior authorization for Kisqali  is required.   PA submitted on CMM via [employer] [id number] Status is pending     Damali Broadfoot (Patty) Chet Burnet, CPhT  [facility], [place], [facility] - Oral Chemotherapy Patient Advocate Specialist III Phone: [phone]  Fax: [phone]

## 2024-12-11 NOTE — Progress Notes (Signed)
 Specialty Pharmacy Initial Fill Coordination Note  Morgan Peterson is a 73 y.o. female contacted today regarding initial fill of specialty medication(s) Ribociclib  Succinate (KISQALI  600mg  Daily Dose)   Patient requested Delivery   Delivery date: 12/18/24   Verified address: [address] [place] [address]   Medication will be filled on: 12/17/24   Patient is aware of $0 copayment.   Alainah Phang (Patty) Chet Burnet, CPhT  [facility], [place], [facility] - Oral Chemotherapy Patient Advocate Specialist III Phone: [phone]  Fax: [phone]

## 2024-12-11 NOTE — Patient Instructions (Signed)
 [facility] - A DEPT OF [facility]    Thank you for choosing [facility] to provide your oncology/hematology care and for allowing us  to participate in your care today!  As a reminder, we spoke about the following today: Ribociclib  for metastatic breast cancer, planned duration until disease progression or unacceptable toxicity  Treatment goal: Palliative  Medication handout has been provided.   **For oral cancer medication prescription refill requests, contact your pharmacy and they will contact our office if needed. Allow 5-7 days for refills to be completed by your specialty pharmacy.    Cancer Center General Instructions:  If you have an appointment at the [facility], please go directly to the Cancer Center and check in at the registration area.  We strive to give you quality time with your provider. You may need to reschedule your appointment if you arrive late (15 or more minutes).  Arriving late affects you and other patients whose appointments are after yours.  Also, if you miss three or more appointments without notifying the office, you may be dismissed from the clinic at the provider's discretion.      BELOW ARE SYMPTOMS THAT SHOULD BE REPORTED IMMEDIATELY: *FEVER GREATER THAN 100.4 F (38 C) OR HIGHER *CHILLS OR SWEATING *NAUSEA AND VOMITING THAT IS NOT CONTROLLED WITH YOUR NAUSEA MEDICATION *UNUSUAL SHORTNESS OF BREATH *UNUSUAL BRUISING OR BLEEDING *URINARY PROBLEMS (pain or burning when urinating, or frequent urination) *BOWEL PROBLEMS (unusual diarrhea, constipation, pain near the anus) TENDERNESS IN MOUTH AND THROAT WITH OR WITHOUT PRESENCE OF ULCERS (sore throat, sores in mouth, or a toothache) UNUSUAL RASH, SWELLING OR PAIN  UNUSUAL VAGINAL DISCHARGE OR ITCHING   Items with * indicate a potential emergency and should be followed up as soon as possible or go to the Emergency Department if any problems should  occur.  Please show the CHEMOTHERAPY ALERT CARD at check-in to the Emergency Department and triage nurse.  Should you have questions after your visit or need to cancel or reschedule your appointment, please contact [facility] - A DEPT OF [facility]  [phone] and follow the prompts.  Office hours are 8:00 a.m. to 4:30 p.m. Monday - Friday. Please note that voicemails left after 4:00 p.m. may not be returned until the following business day.  We are closed weekends and major holidays. You have access to a nurse at all times for urgent questions. Please call the main number to the clinic [phone] and follow the prompts.  For any non-urgent questions, you may also contact your provider using MyChart. We now offer e-Visits for anyone 21 and older to request care online for non-urgent symptoms. For details visit [email or web address].   Also download the MyChart app! Go to the app store, search MyChart, open the app, select [place], and log in with your MyChart username and password.

## 2024-12-11 NOTE — Telephone Encounter (Signed)
 Oral Oncology Patient Advocate Encounter  Prior Authorization for Kisqali  has been approved.    PA# [id number] Effective dates: 12/11/2024 through 12/11/2025  Patients co-pay is $0.    Morgan Peterson (Morgan Peterson) Chet Burnet, CPhT  [facility], [place], [facility] - Oral Chemotherapy Patient Advocate Specialist III Phone: [phone]  Fax: [phone]

## 2024-12-11 NOTE — Progress Notes (Signed)
 Patient education documented in EPIC note on 12/11/24.

## 2024-12-11 NOTE — Progress Notes (Signed)
 [facility] CONSULT NOTE  Patient Care Team: Sowles, [name], MD as PCP - General (Family Medicine) Dessa, Reyes ORN, MD (General Surgery) [name], MD as Referring Physician (Otolaryngology) Florencio Cara BIRCH, MD as Consulting Physician (Cardiology) Cathlyn Seal, MD as Referring Physician (Dermatology) Cherilyn Debby CROME, MD as Consulting Physician (Internal Medicine) Rennie Cindy SAUNDERS, MD as Consulting Physician (Oncology) Tye Millet, DO as Consulting Physician (General Surgery) [facility], [facility]  CHIEF COMPLAINTS/PURPOSE OF CONSULTATION: Breast cancer  #  Oncology History Overview Note  She has a remote history of right breast cancer, T1bN0, ER positive,s/p lumpectomy and MammoSite radiation and 5 years of Arimidex.    02/19/22 screening mammogram bilaterally showed  1. Suspicious right breast mass at the 10 o'clock position 8 cm from the nipple. Recommend ultrasound-guided biopsy. 2. Indeterminate right breast mass at the 6 o'clock position 4 cm from the nipple. Recommend ultrasound-guided biopsy. 3. No suspicious right axillary lymphadenopathy.   04/12/2022 Unilateral right breast diagnostic mammogram showed 1. Suspicious right breast mass at the 10 o'clock position 8 cm from the nipple. Recommend ultrasound-guided biopsy. 2. Indeterminate right breast mass at the 6 o'clock position 4 cm from the nipple. Recommend ultrasound-guided biopsy. 3. No suspicious right axillary lymphadenopathy.   04/13/2022 right breast mass 6 o'clock position 1 cm from the nipple showed invasive mammary carcinoma, no special type. Grade 3, DCIS present high grade, LVI not identified. ER-, PR 1-10%, HER 2 -   04/13/2022, right breast mass 10:00 7 cm from nipple biopsy showed invasive mammary carcinoma, grade 3, high-grade DCIS with focal comedonecrosis, lymphovascular invasion not identified.  ER -, PR 1 to 10%, HER2-    04/26/2022, right breast mastectomy with  axillary dissection Invasive mammary carcinoma, no special type, multifocal, DCIS high-grade, benign nipple/areola.  2 deposits of invasive mammary carcinoma, no definite residual lymph node identified.  NOV 2025- CT chest-  Large left pleural effusion with pleural nodularity, consistent with pleural metastasis; Tiny right pleural effusion with findings suspicious for right pleural and pulmonary parenchymal metastases including nodularity along the right major fissure and scattered right lung nodules up to 4 mm; Right axillary nodal metastasis in the setting of prior right mastectomy.- s/p thora-    Breast cancer (HCC)  05/10/2022 Initial Diagnosis   Breast cancer (HCC)   05/10/2022 Cancer Staging   Staging form: Breast, AJCC 8th Edition - Pathologic stage from 05/10/2022: Stage IIA (pT1c, pN1a, cM0, G3, ER-, PR+, HER2-) - Signed by Babara Call, MD on 05/10/2022 Stage prefix: Initial diagnosis Histologic grading system: 3 grade system   05/24/2022 - 05/24/2022 Chemotherapy   Patient is on Treatment Plan : BREAST Pembrolizumab  (200) D1 + Carboplatin  (5) D1 + Paclitaxel  (80) D1,8,15 q21d X 4 cycles / Pembrolizumab  (200) D1 + AC D1 q21d x 4 cycles     05/27/2022 - 08/13/2022 Chemotherapy   Patient is on Treatment Plan : BREAST Pembrolizumab  (200) D1 + Carboplatin  (5) D1 + Paclitaxel  (80) D1,8,15 q21d X 4 cycles / Pembrolizumab  (200) D1 + AC D1 q21d x 4 cycles     05/27/2022 - 12/07/2022 Chemotherapy   Patient is on Treatment Plan : BREAST Pembrolizumab  (200) D1 + Carboplatin  (5) D1 + Paclitaxel  (80) D1,8,15 q21d X 4 cycles / Pembrolizumab  (200) D1 + AC D1 q21d x 4 cycles     01/04/2023 - 06/21/2023 Chemotherapy   Patient is on Treatment Plan : BREAST Pembrolizumab  (200) q21d x 27 weeks     Carcinoma of upper-outer quadrant of  right breast in female, estrogen receptor negative (HCC)  05/26/2022 Initial Diagnosis   Carcinoma of upper-outer quadrant of right breast in female, estrogen receptor negative (HCC)    05/26/2022 Cancer Staging   Staging form: Breast, AJCC 8th Edition - Pathologic: Stage IIA (pT1c, pN1, cM0, G3, ER-, PR-, HER2-) - Signed by Rennie Cindy SAUNDERS, MD on 05/26/2022 Multigene prognostic tests performed: None Histologic grading system: 3 grade system   05/27/2022 - 12/07/2022 Chemotherapy   Patient is on Treatment Plan : BREAST Pembrolizumab  (200) D1 + Carboplatin  (5) D1 + Paclitaxel  (80) D1,8,15 q21d X 4 cycles / Pembrolizumab  (200) D1 + AC D1 q21d x 4 cycles     01/04/2023 - 06/21/2023 Chemotherapy   Patient is on Treatment Plan : BREAST Pembrolizumab  (200) q21d x 27 weeks      HISTORY OF PRESENTING ILLNESS: Ambulating independently.  Accompanied by husband.  Morgan Peterson 73 y.o.  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  She recently underwent  CT guided biopsy under sedation for evaluation of recurrent disease. She is unsure of the procedural details but experienced pain the following day, particularly when lying on her side. The pain is present but not severe, and she is not currently taking any analgesics. She expressed interest in using ibuprofen  for pain relief but has not obtained a prescription and finds over-the-counter doses insufficient. She does not desire stronger analgesics and is concerned about gastrointestinal side effects due to reflux.  Her oncologic history includes right-sided breast cancer diagnosed in 2010,  treated with lumpectomy, radiation, and five years of adjuvant Arimidex without chemotherapy. The current recurrence was confirmed by biopsy as ER and PR positive. She recalls a prior cancer in 2023 that was ER negative. She has undergone PET imaging as part of her recent workup. She is currently experiencing weight loss and poor appetite, and has been supplementing her diet with protein shakes as recommended by a nutritionist. She is not a frequent meat eater and consumed her last protein shake the morning of the visit.  She inquired about the use of soursop tea and dandelion tea for potential anti-neoplastic benefits and is concerned about possible interactions with her planned treatment. She is open to discussing these with the pharmacist. She has had a recent EKG performed during a hospital visit and is aware that ongoing therapy for her metastatic disease will be indefinite unless there is lack of efficacy or intolerable side effects. She and her family are aware of her diagnosis and the possibility of chemotherapy in the future, pending response to current therapy.     Review of Systems  Constitutional:  Positive for malaise/fatigue. Negative for chills, diaphoresis, fever and weight loss.  HENT:  Negative for nosebleeds and sore throat.   Eyes:  Negative for double vision.  Respiratory:  Negative for cough, hemoptysis, sputum production, shortness of breath and wheezing.   Cardiovascular:  Negative for chest pain, palpitations, orthopnea and leg swelling.  Gastrointestinal:  Negative for abdominal pain, blood in stool, constipation, diarrhea, heartburn, melena, nausea  and vomiting.  Genitourinary:  Negative for dysuria, frequency and urgency.  Musculoskeletal:  Negative for back pain and joint pain.  Skin: Negative.  Negative for itching and rash.  Neurological:  Positive for tingling. Negative for dizziness, focal weakness, weakness and headaches.  Endo/Heme/Allergies:  Does not bruise/bleed  easily.  Psychiatric/Behavioral:  Negative for depression. The patient is not nervous/anxious and does not have insomnia.      MEDICAL HISTORY:  Past Medical History:  Diagnosis Date   Appetite loss    Asthma    Cervical radiculopathy    Chemotherapy-induced nausea    Hiatal hernia    Hypercholesterolemia    Hyperlipidemia    Hypertension    IBS (irritable bowel syndrome)    Malignant neoplasm of upper-outer quadrant of female breast (HCC) 04/11/2009   Right breast, invasive ductal carcinoma, 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 Arimidex therapy completed September 2015   Mild mitral valve prolapse    Neuropathy    feet, from chemo   Personal history of malignant neoplasm of breast 2010   Personal history of radiation therapy 2010   mammosite   T2DM (type 2 diabetes mellitus) (HCC) 2011    SURGICAL HISTORY: Past Surgical History:  Procedure Laterality Date   ABDOMINAL EXPLORATION SURGERY  2000   ovarian cyst   BREAST BIOPSY Right 2010   +    BREAST BIOPSY Left 04/06/2023   Left Breast Stereo Bx X Clip - path pending   BREAST BIOPSY Left 04/06/2023   Left Breast Stereo Bx, Coil Clip - path pending   BREAST BIOPSY Left 04/06/2023   MM LT BREAST BX W LOC DEV 1ST LESION IMAGE BX SPEC STEREO GUIDE 04/06/2023 [facility]   BREAST BIOPSY Left 04/06/2023   MM LT BREAST BX W LOC DEV EA AD LESION IMG BX SPEC STEREO GUIDE 04/06/2023 [facility]   BREAST EXCISIONAL BIOPSY Right 2010   + mammo site inasive mammo ca   BREAST LUMPECTOMY Right 2010   [facility]   COLONOSCOPY  07/19/2012   Normal exam, Dr. Dessa   COLONOSCOPY WITH PROPOFOL  N/A 04/21/2022   Procedure: COLONOSCOPY WITH PROPOFOL ;  Surgeon: Dessa Reyes ORN, MD;  Location: [facility];  Service: Endoscopy;  Laterality: N/A;   ESOPHAGOGASTRODUODENOSCOPY (EGD) WITH PROPOFOL  N/A 10/22/2022   Procedure: ESOPHAGOGASTRODUODENOSCOPY (EGD) WITH PROPOFOL ;  Surgeon:  Jinny Carmine, MD;  Location: [facility];  Service: Endoscopy;  Laterality: N/A;  Diabetic   MASTECTOMY Right 2010   partial/ lumpectomy   PORTACATH PLACEMENT Left 05/19/2022   Procedure: INSERTION PORT-A-CATH;  Surgeon: Dessa Reyes ORN, MD;  Location: [facility];  Service: General;  Laterality: Left;   SIMPLE MASTECTOMY WITH AXILLARY SENTINEL NODE BIOPSY Right 04/26/2022   Procedure: SIMPLE MASTECTOMY WITH AXILLARY SENTINEL NODE BIOPSY;  Surgeon: Dessa Reyes ORN, MD;  Location: [facility];  Service: General;  Laterality: Right;  RNFA to assist   TUBAL LIGATION  20 years ago    SOCIAL HISTORY: Social History   Socioeconomic History   Marital status: Married    Spouse name: Antonio   Number of children: 2   Years of education: Not on file   Highest education level: Not on file  Occupational History   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  Tobacco Use   Smoking status: Former    Current packs/day: 0.00    Average packs/day: 0.3 packs/day for 10.0 years (2.5 ttl pk-yrs)  Types: Cigarettes    Start date: 30    Quit date: 1988    Years since quitting: 38.0   Smokeless tobacco: Never   Tobacco comments:    smoking cessation materials not required  Vaping Use   Vaping status: Never Used  Substance and Sexual Activity   Alcohol use: Yes    Comment: wine occ   Drug use: No   Sexual activity: Not Currently    Comment: husband has ED  Other Topics Concern   Not on file  Social History Narrative   Oncology nurse on the floor retired.;  Husband retired from [employer].  No smoking.   Social Drivers of Health   Tobacco Use: Medium Risk (12/11/2024)   Patient History    Smoking Tobacco Use: Former    Smokeless Tobacco Use: Never    Passive Exposure: Not on file  Financial Resource Strain: Low Risk (04/12/2024)   Overall Financial Resource Strain (CARDIA)    Difficulty of Paying Living Expenses: Not hard at all  Food Insecurity: No Food Insecurity  (11/13/2024)   Epic    Worried About Programme Researcher, Broadcasting/film/video in the Last Year: Never true    Ran Out of Food in the Last Year: Never true  Transportation Needs: No Transportation Needs (11/13/2024)   Epic    Lack of Transportation (Medical): No    Lack of Transportation (Non-Medical): No  Physical Activity: Sufficiently Active (04/12/2024)   Exercise Vital Sign    Days of Exercise per Week: 3 days    Minutes of Exercise per Session: 60 min  Stress: No Stress Concern Present (04/12/2024)   [employer] of Occupational Health - Occupational Stress Questionnaire    Feeling of Stress : Not at all  Social Connections: Unknown (11/13/2024)   Social Connection and Isolation Panel    Frequency of Communication with Friends and Family: More than three times a week    Frequency of Social Gatherings with Friends and Family: Twice a week    Attends Religious Services: Not on file    Active Member of Clubs or Organizations: No    Attends Banker Meetings: Never    Marital Status: Married  Catering Manager Violence: Not At Risk (11/13/2024)   Epic    Fear of Current or Ex-Partner: No    Emotionally Abused: No    Physically Abused: No    Sexually Abused: No  Depression (PHQ2-9): Low Risk (12/11/2024)   Depression (PHQ2-9)    PHQ-2 Score: 0  Alcohol Screen: Low Risk (04/12/2024)   Alcohol Screen    Last Alcohol Screening Score (AUDIT): 0  Housing: Low Risk (11/13/2024)   Epic    Unable to Pay for Housing in the Last Year: No    Number of Times Moved in the Last Year: 0    Homeless in the Last Year: No  Utilities: Not At Risk (11/13/2024)   Epic    Threatened with loss of utilities: No  Health Literacy: Adequate Health Literacy (04/12/2024)   B1300 Health Literacy    Frequency of need for help with medical instructions: Never    FAMILY HISTORY: Family History  Problem Relation Age of Onset   Osteoporosis Mother    Diabetes Father    Kidney disease Father    Diabetes  Brother    Breast cancer Maternal Aunt    Bladder Cancer Maternal Aunt    Cervical cancer Maternal Aunt    Colon cancer Maternal Uncle    Lung cancer Maternal  Uncle     ALLERGIES:  is allergic to levaquin [levofloxacin in d5w], losartan, metoprolol, saxagliptin, amlodipine-olmesartan, canagliflozin, egg white [egg protein (egg white)], gabapentin , glipizide, gluten meal, lisinopril, metformin  and related, milk (cow), milk-related compounds, shrimp extract, tramadol, zyprexa  [olanzapine ], actos [pioglitazone], atorvastatin , carvedilol , and prednisone .  MEDICATIONS:  Current Outpatient Medications  Medication Sig Dispense Refill   aspirin  EC 81 MG tablet Take 1 tablet (81 mg total) by mouth daily. 30 tablet 0   atenolol  (TENORMIN ) 25 MG tablet Take 12.5 mg by mouth daily.     Azelastine  HCl 137 MCG/SPRAY SOLN PLACE 1 SPRAY INTO BOTH NOSTRILS 2 (TWO) TIMES DAILY. USE IN EACH NOSTRIL AS DIRECTED 90 mL 0   benzonatate  (TESSALON ) 100 MG capsule Take 1 capsule (100 mg total) by mouth 3 (three) times daily as needed for cough. 60 capsule 0   betamethasone dipropionate 0.05 % lotion Apply 1 Application topically daily.     betamethasone, augmented, (DIPROLENE) 0.05 % lotion Apply 1 application  topically 2 (two) times daily.     Blood Glucose Monitoring Suppl (ONETOUCH VERIO) w/Device KIT 1 Device by Does not apply route once. 1 kit 0   Cyanocobalamin (VITAMIN B-12 PO) Take 1 tablet by mouth daily.     diazepam  (VALIUM ) 2 MG tablet One pill 45-60 mins prior to procedure; 1 pill 15 mins prior if needed. 3 tablet 0   EPINEPHrine  (EPIPEN  2-PAK) 0.3 mg/0.3 mL IJ SOAJ injection Inject 0.3 mg into the muscle as needed for anaphylaxis. 2 each 0   fexofenadine (ALLEGRA) 180 MG tablet Take 180 mg by mouth daily.     glucose blood (ONETOUCH VERIO) test strip Use as instructed 100 each 12   ketoconazole (NIZORAL) 2 % shampoo Apply 1 Application topically 3 (three) times a week.     Lancets (ONETOUCH ULTRASOFT)  lancets Use as instructed 100 each 3   letrozole  (FEMARA ) 2.5 MG tablet Take 1 tablet (2.5 mg total) by mouth daily. 90 tablet 1   minoxidil  (LONITEN ) 2.5 MG tablet Take 2.5 mg by mouth daily.     montelukast  (SINGULAIR ) 10 MG tablet Take 1 tablet (10 mg total) by mouth at bedtime. 90 tablet 1   Multiple Vitamin (MULTIVITAMIN WITH MINERALS) TABS tablet Take 1 tablet by mouth 2 (two) times a week.     omeprazole  (PRILOSEC) 20 MG capsule Take 1 capsule (20 mg total) by mouth daily. 90 capsule 1   OZEMPIC, 0.25 OR 0.5 MG/DOSE, 2 MG/1.5ML SOPN Inject 0.5 mg into the skin every Sunday.     OZEMPIC, 0.25 OR 0.5 MG/DOSE, 2 MG/3ML SOPN Inject 0.5 mg into the skin every 7 (seven) days.     polyethylene glycol (MIRALAX  / GLYCOLAX ) 17 g packet Take 17 g by mouth daily.     prednisoLONE acetate (PRED FORTE) 1 % ophthalmic suspension Place 1 drop into the left eye.     rosuvastatin  (CRESTOR ) 20 MG tablet Take 1 tablet (20 mg total) by mouth every morning. 90 tablet 1   spironolactone  (ALDACTONE ) 25 MG tablet Take 1 tablet (25 mg total) by mouth daily. 90 tablet 1   Vitamin D , Ergocalciferol , (DRISDOL ) 1.25 MG (50000 UNIT) CAPS capsule Take 50,000 Units by mouth once a week.     levocetirizine (XYZAL ) 5 MG tablet TAKE 1 TABLET (5 MG TOTAL) BY MOUTH DAILY. 90 tablet 0   NON FORMULARY Soursop Tea     NON FORMULARY Dandelion tea     NON FORMULARY Turmeric cinnamon ginger tea  prochlorperazine  (COMPAZINE ) 10 MG tablet Take 1 tablet (10 mg total) by mouth every 6 (six) hours as needed for nausea or vomiting. 30 tablet 2   ribociclib  succ (KISQALI  600MG  DAILY DOSE) 200 MG Therapy Pack Take 3 tablets (600 mg total) by mouth daily. Take for 21 days on, 7 days off, repeat every 28 days. 63 tablet 1   No current facility-administered medications for this visit.   Facility-Administered Medications Ordered in Other Visits  Medication Dose Route Frequency Provider Last Rate Last Admin   heparin  lock flush 100  UNIT/ML injection            heparin  lock flush 100 unit/mL  500 Units Intravenous Once Mikel Pyon R, MD          .  PHYSICAL EXAMINATION: ECOG PERFORMANCE STATUS: 0 - Asymptomatic  Vitals:   12/11/24 1005 12/11/24 1013  BP: (!) 142/74 124/75  Pulse: (!) 117   Resp: 16   Temp: (!) 97.5 F (36.4 C)   SpO2: 97%        Filed Weights   12/11/24 1005  Weight: 166 lb 11.2 oz (75.6 kg)     Decreased breath sounds on the left compared to right.   Physical Exam Vitals and nursing note reviewed.  HENT:     Head: Normocephalic and atraumatic.     Mouth/Throat:     Pharynx: Oropharynx is clear.  Eyes:     Extraocular Movements: Extraocular movements intact.     Pupils: Pupils are equal, round, and reactive to light.  Cardiovascular:     Rate and Rhythm: Normal rate and regular rhythm.  Abdominal:     Palpations: Abdomen is soft.  Musculoskeletal:        General: Normal range of motion.     Cervical back: Normal range of motion.  Skin:    General: Skin is warm.  Neurological:     General: No focal deficit present.     Mental Status: She is alert and oriented to person, place, and time.  Psychiatric:        Behavior: Behavior normal.        Judgment: Judgment normal.      LABORATORY DATA:  I have reviewed the data as listed Lab Results  Component Value Date   WBC 4.4 12/05/2024   HGB 11.5 (L) 12/05/2024   HCT 36.4 12/05/2024   MCV 91.0 12/05/2024   PLT 264 12/05/2024   Recent Labs    09/18/24 1308 11/12/24 1539 11/13/24 0432 11/19/24 1149  NA 137 139 136 139  K 4.2 4.0 4.3 4.2  CL 105 101 101 102  CO2 24 25 21* 24  GLUCOSE 100* 116* 83 102*  BUN 15 6* 7* 9  CREATININE 0.91 0.83 0.68 0.96  CALCIUM  9.0 9.6 9.3 9.4  GFRNONAA >60 >60 >60 >60  PROT 7.0 7.3  --  7.5  ALBUMIN 3.6 4.0  --  3.5  AST 17 22  --  23  ALT 11 8  --  13  ALKPHOS 85 95  --  75  BILITOT 0.8 0.4  --  0.8    RADIOGRAPHIC STUDIES: I have personally reviewed the  radiological images as listed and agreed with the findings in the report. CT LUNG MASS BIOPSY Result Date: 12/05/2024 INDICATION: FDG avid pleural thickening on the left side. EXAM: CT-guided biopsy TECHNIQUE: Multidetector CT imaging of the chest was performed following the standard protocol without IV contrast. RADIATION DOSE REDUCTION: This exam was performed  according to the departmental dose-optimization program which includes automated exposure control, adjustment of the mA and/or kV according to patient size and/or use of iterative reconstruction technique. MEDICATIONS: None. ANESTHESIA/SEDATION: Moderate (conscious) sedation was employed during this procedure. A total of Versed  1 mg and Fentanyl  50 mcg was administered intravenously by the radiology nurse. Total intra-service moderate Sedation Time: 12 minutes. The patient's level of consciousness and vital signs were monitored continuously by radiology nursing throughout the procedure under my direct supervision. COMPLICATIONS: None immediate. PROCEDURE: Informed written consent was obtained from the patient after a thorough discussion of the procedural risks, benefits and alternatives. All questions were addressed. Maximal Sterile Barrier Technique was utilized including caps, mask, sterile gowns, sterile gloves, sterile drape, hand hygiene and skin antiseptic. A timeout was performed prior to the initiation of the procedure. With patient in a prone position and radiopaque markers on the left posterior chest wall, axial images of the chest were obtained. The anatomy was reviewed and measurements obtained of the likely target. The patient's skin was then marked. The patient was then prepped and draped in usual sterile fashion. Local anesthesia was achieved with 1% lidocaine . 17 gauge needle was then advanced through a small incision in the posterior wall directed towards the pleural thickening. Repeat imaging was performed to redirect the needle and  advanced the tip of the needle to the target. When the needle was in position the stylet was removed. The BioPince 18 gauge biopsy needle was then advanced through the introducer and multiple samples were obtained of the thickened pleura. Cores were sent to pathology for further evaluation. All needles removed from the patient. Repeat imaging demonstrates no acute hemorrhage or pneumothorax. IMPRESSION: Satisfactory core needle biopsy of left pleural thickening. Electronically Signed   By: Cordella Banner   On: 12/05/2024 13:51   US  THORACENTESIS ASP PLEURAL SPACE W/IMG GUIDE Result Date: 11/28/2024 INDICATION: 73 year old female with a history of breast cancer and recurrent left-sided pleural effusion. Request for therapeutic thoracentesis. EXAM: ULTRASOUND GUIDED THERAPEUTIC, LEFT-SIDED THORACENTESIS MEDICATIONS: 1% lidocaine , 8 mL. COMPLICATIONS: None immediate. PROCEDURE: An ultrasound guided thoracentesis was thoroughly discussed with the patient and questions answered. The benefits, risks, alternatives and complications were also discussed. The patient understands and wishes to proceed with the procedure. Written consent was obtained. Ultrasound was performed to localize and mark an adequate pocket of fluid in the left chest. The area was then prepped and draped in the normal sterile fashion. 1% Lidocaine  was used for local anesthesia. Under ultrasound guidance a 6 Fr Safe-T-Centesis catheter was introduced. Thoracentesis was performed. The catheter was removed and a dressing applied. FINDINGS: A total of approximately 1.4 L of bloody fluid was removed. IMPRESSION: Successful ultrasound guided left-sided thoracentesis yielding 1.4 L of pleural fluid. Procedure performed by: Sherrilee Bal, PA-C under the supervision of Dr. VEAR Lent Electronically Signed   By: Wilkie Lent M.D.   On: 11/28/2024 12:20   DG Chest Port 1 View Result Date: 11/28/2024 CLINICAL DATA:  Status post left  thoracentesis. EXAM: PORTABLE CHEST 1 VIEW COMPARISON:  11/13/2024 FINDINGS: Stable large air containing hiatal hernia. Moderate-sized left pleural effusion, decreased following thoracentesis. No pneumothorax. Mildly improved left basilar atelectasis. Minimal right pleural effusion. Clear right lung. Normal-sized heart. Thoracolumbar spine degenerative changes and mild scoliosis. IMPRESSION: 1. Moderate-sized left pleural effusion, decreased following thoracentesis. 2. No pneumothorax. 3. Mildly improved left basilar atelectasis. 4. Minimal right pleural effusion. 5. Stable large hiatal hernia. Electronically Signed   By: Elspeth Robynn HERO.D.  On: 11/28/2024 11:49   NM PET Image Initial (PI) Skull Base To Thigh (F-18 FDG) Result Date: 11/21/2024 CLINICAL DATA:  Subsequent treatment strategy for right breast cancer. Metastatic disease on recent left thoracentesis. EXAM: NUCLEAR MEDICINE PET SKULL BASE TO THIGH TECHNIQUE: 9.27 mCi F-18 FDG was injected intravenously. Full-ring PET imaging was performed from the skull base to thigh after the radiotracer. CT data was obtained and used for attenuation correction and anatomic localization. Fasting blood glucose: 110 mg/dl COMPARISON:  Chest CT 88/75/7974 FINDINGS: Mediastinal blood pool activity: SUV max 2.6 NECK: No hypermetabolic cervical lymph nodes are identified. No suspicious activity identified within the pharyngeal mucosal space. Incidental CT findings: none CHEST: There is extensive hypermetabolic nodularity throughout the left pleural space consistent with known pleural metastatic disease. The most intense hypermetabolic activity is located dependently in the lower left hemithorax (SUV max 11.3). Persistent large left pleural effusion with near complete collapse of the left lung. Trace right pleural effusion without right pleural hypermetabolic activity. The small pleural-based and parenchymal nodules in the right hemithorax seen on previous CT are too small to  evaluate by PET-CT. There are multiple hypermetabolic mediastinal, left hilar and left internal mammary lymph nodes. Representative nodes include in AP window node with an SUV max of 9.9 and a left hilar node with an SUV max of 12.5. Previously noted 2.2 x 1.4 cm right axillary nodal mass on image 41/6 demonstrates only low level metabolic activity (SUV max 4.5), and may be treatment related. There is no hypermetabolic left axillary adenopathy. Incidental CT findings: Unchanged mediastinal shift to the right attributed to the large left pleural effusion. Moderate to large hiatal hernia. Mild aortic atherosclerosis. Status post right mastectomy without evidence of chest wall recurrence. ABDOMEN/PELVIS: There is no hypermetabolic activity within the liver, adrenal glands, spleen or pancreas. Scattered small retroperitoneal lymph nodes with low level metabolic activity, likely physiologic. Bowel activity within physiologic limits. No suspicious adnexal findings. Incidental CT findings: Mild aortoiliac atherosclerosis. No ascites or peritoneal nodularity. SKELETON: Small lytic lesion anteriorly in the T7 vertebral body demonstrates mild hypermetabolic activity (SUV max 5.3). There are scattered sclerotic lesions within the lumbar spine and bony pelvis with low level metabolic activity, suspicious for metastatic disease. Largest lesion posteriorly in the left iliac bone measures approximately 1.3 cm on image 113/6 and has an SUV max of 4.9. Incidental CT findings: Multilevel spondylosis with degenerative anterolisthesis at the L4-5 and L5-S1 levels. IMPRESSION: 1. Extensive hypermetabolic nodularity throughout the left pleural space consistent with known pleural metastatic disease. Persistent large left pleural effusion with near complete collapse of the left lung and mediastinal shift to the right. 2. Hypermetabolic mediastinal, left hilar and left internal mammary lymphadenopathy consistent with metastatic disease. No  suspicious axillary nodal involvement. 3. Previously demonstrated right pleural and pulmonary nodularity is not associated with significant hypermetabolic activity, although is suboptimally evaluated due to small size. 4. Scattered small sclerotic lesions within the lumbar spine and bony pelvis with low level metabolic activity, suspicious for metastatic disease. 5. No evidence of extra osseous metastatic disease in the abdomen or pelvis. 6.  Aortic Atherosclerosis (ICD10-I70.0). Electronically Signed   By: Elsie Perone M.D.   On: 11/21/2024 11:12     ASSESSMENT & PLAN:   Carcinoma of upper-outer quadrant of right breast in female, estrogen receptor negative (HCC) # NOV 2025- CT chest-  Large left pleural effusion with pleural nodularity, consistent with pleural metastasis; Tiny right pleural effusion with findings suspicious for right pleural and pulmonary  parenchymal metastases including nodularity along the right major fissure and scattered right lung nodules up to 4 mm; Right axillary nodal metastasis in the setting of prior right mastectomy.  Recommend PET scan ASAP.  Also discussed regarding possible need for a biopsy of the right axillary lymph node based upon the results of PET scan.Pleura, biopsy:  METASTATIC ADENOCARCINOMA, CONSISTENT WITH PATIENT'S CLINICAL HISTORY OF       PRIMARY BREAST CARCINOMA,  The tumor cells are NEGATIVE for Her2 (0). Estrogen Receptor:  100%, POSITIVE, Progesterone Receptor:  80%, POSITIVE Proliferation Marker Ki67:  20%. DEC 2025- PET scan: Extensive hypermetabolic nodularity throughout the left pleural space consistent with known pleural metastatic disease. Persistent large left pleural effusion with near complete collapse of the left lung and mediastinal shift to the right. Hypermetabolic mediastinal, left hilar and left internal mammary lymphadenopathy consistent with metastatic disease. No suspicious axillary nodal involvement.  Previously demonstrated right  pleural and pulmonary nodularity is not associated with significant hypermetabolic activity, although is suboptimally evaluated due to small size. Scattered small sclerotic lesions within the lumbar spine and bony pelvis with low level metabolic activity, suspicious for metastatic disease. No evidence of extra osseous metastatic disease in the abdomen or pelvis.  # I reviewed the pathology and stage-as IV with the patient and family.  Discussed unfortunately her malignancy is not curable.  However treatments-could potentially help control the disease and help her live longer.  Life expectancy is usually in the order of years.  In general treatments are indefinite; until progression of disease or intolerance. # Recommend checking NGS   # Recommend starting patient on letrozole ; along with CDK- inhibitors. I discussed the mechanism of action of aromatase inhibitors-with blocking of estrogen to prevent breast cancer.  Also discussed the potential side effects including but not limited to arthralgias hot flashes and increased risk of osteoporosis.  I discussed regarding Ribociclib  3 weeks on 1 week off along with antiestrogen therapy.  I reviewed the potential side effects including but not limited to nausea vomiting diarrhea skin rash liver and kidney abnormalities; and also neutropenia.  I discussed with pharmacy-  EKG at baseline- NOV 2025- WNL;  and also after starting treatment.  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  Monitor blood sugars closely. Stable  # Bone health- April 2024- BMD measured at [facility] Neck Right is 0.879 g/cm2 with a T-score of -1.1.  # #Incidental findings on Imaging  CT , 2025: History of chronic -large hiatal hernia with approximately half of the stomach positioned in the lower chest. I reviewed/discussed/counseled the patient.    # IV access- s/p  Explantation-PIV.   #  DISPOSITION: # Follow up in  in 3 weeks- MD; labs- cbc/cmp- EKG-Dr.B  # I reviewed the blood work- with the patient in detail; also reviewed the imaging independently [as summarized above]; and with the patient in detail.   # 40 minutes face-to-face with the patient discussing the above plan of care; more than 50% of time spent on prognosis/ natural history; counseling and coordination.   All questions were answered. The patient/family knows to call the clinic with any problems, questions or concerns.    Cindy JONELLE Joe, MD 12/15/2024 5:25 PM

## 2024-12-11 NOTE — Progress Notes (Signed)
 "  Clinical Pharmacist Practitioner Clinic [facility]  [phone] [phone]  Patient Care Team: Sowles, [name], MD as PCP - General (Family Medicine) Dessa, Reyes ORN, MD (General Surgery) [name], MD as Referring Physician (Otolaryngology) Florencio Cara BIRCH, MD as Consulting Physician (Cardiology) Cathlyn Seal, MD as Referring Physician (Dermatology) Cherilyn Debby CROME, MD as Consulting Physician (Internal Medicine) Rennie Cindy SAUNDERS, MD as Consulting Physician (Oncology) Tye Millet, DO as Consulting Physician (General Surgery) [facility], [facility]   Name of the patient: Morgan Peterson  [id number]  09-Jun-1951   Date of visit: 12/11/2024   HPI: Patient is a 73 y.o. female with metastatic breast cancer, ER/PR positive and HER2 negative.  Planned treatment with Ribociclib  and letrozole . Planned start 12/18/24.  Reason for Consult: Ribociclib  oral chemotherapy education.   PAST MEDICAL HISTORY: Past Medical History:  Diagnosis Date   Appetite loss    Asthma    Cervical radiculopathy    Chemotherapy-induced nausea    Hiatal hernia    Hypercholesterolemia    Hyperlipidemia    Hypertension    IBS (irritable bowel syndrome)    Malignant neoplasm of upper-outer quadrant of female breast (HCC) 04/11/2009   Right breast, invasive ductal carcinoma, 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 Arimidex therapy completed September 2015   Mild mitral valve prolapse    Neuropathy    feet, from chemo   Personal history of malignant neoplasm of breast 2010   Personal history of radiation therapy 2010   mammosite   T2DM (type 2 diabetes mellitus) (HCC) 2011    HEMATOLOGY/ONCOLOGY HISTORY:  Oncology History Overview Note  # 2010- She has a remote history of right breast cancer, T1bN0, ER positive,s/p lumpectomy and MammoSite radiation and 5 years of Arimidex. No chemo.  []    02/19/22 screening mammogram bilaterally showed  1. Suspicious right breast mass at the 10 o'clock position 8 cm from the nipple. Recommend ultrasound-guided biopsy. 2. Indeterminate right breast mass at the 6 o'clock position 4 cm from the nipple. Recommend ultrasound-guided biopsy. 3. No suspicious right axillary lymphadenopathy.   04/12/2022 Unilateral right breast diagnostic mammogram showed 1. Suspicious right breast mass at the 10 o'clock position 8 cm from the nipple. Recommend ultrasound-guided biopsy. 2. Indeterminate right breast mass at the 6 o'clock position 4 cm from the nipple. Recommend ultrasound-guided biopsy. 3. No suspicious right axillary lymphadenopathy.   04/13/2022 right breast mass 6 o'clock position 1 cm from the nipple showed invasive mammary carcinoma, no special type. Grade 3, DCIS present high grade, LVI not identified. ER-, PR 1-10%, HER 2 -   04/13/2022, right breast mass 10:00 7 cm from nipple biopsy showed invasive mammary carcinoma, grade 3, high-grade DCIS with focal comedonecrosis, lymphovascular invasion not identified.  ER -, PR 1 to 10%, HER2-    04/26/2022, right breast mastectomy with axillary dissection Invasive mammary carcinoma, no special type, multifocal, DCIS high-grade, benign nipple/areola.  2 deposits of invasive mammary carcinoma, no definite residual lymph node identified.  NOV 2025- CT chest-  Large left pleural effusion with pleural nodularity, consistent with pleural metastasis; Tiny right pleural effusion with findings suspicious for right pleural and pulmonary parenchymal metastases including nodularity along the right major fissure and scattered right lung nodules up to 4 mm; Right axillary nodal metastasis in the setting of prior right mastectomy.- s/p thora-    Breast cancer (HCC)  05/10/2022 Initial Diagnosis   Breast cancer (HCC)  05/10/2022 Cancer Staging   Staging form: Breast, AJCC 8th Edition - Pathologic stage from 05/10/2022:  Stage IIA (pT1c, pN1a, cM0, G3, ER-, PR+, HER2-) - Signed by Babara Call, MD on 05/10/2022 Stage prefix: Initial diagnosis Histologic grading system: 3 grade system   05/24/2022 - 05/24/2022 Chemotherapy   Patient is on Treatment Plan : BREAST Pembrolizumab  (200) D1 + Carboplatin  (5) D1 + Paclitaxel  (80) D1,8,15 q21d X 4 cycles / Pembrolizumab  (200) D1 + AC D1 q21d x 4 cycles     05/27/2022 - 08/13/2022 Chemotherapy   Patient is on Treatment Plan : BREAST Pembrolizumab  (200) D1 + Carboplatin  (5) D1 + Paclitaxel  (80) D1,8,15 q21d X 4 cycles / Pembrolizumab  (200) D1 + AC D1 q21d x 4 cycles     05/27/2022 - 12/07/2022 Chemotherapy   Patient is on Treatment Plan : BREAST Pembrolizumab  (200) D1 + Carboplatin  (5) D1 + Paclitaxel  (80) D1,8,15 q21d X 4 cycles / Pembrolizumab  (200) D1 + AC D1 q21d x 4 cycles     01/04/2023 - 06/21/2023 Chemotherapy   Patient is on Treatment Plan : BREAST Pembrolizumab  (200) q21d x 27 weeks     Carcinoma of upper-outer quadrant of right breast in female, estrogen receptor negative (HCC)  05/26/2022 Initial Diagnosis   Carcinoma of upper-outer quadrant of right breast in female, estrogen receptor negative (HCC)   05/26/2022 Cancer Staging   Staging form: Breast, AJCC 8th Edition - Pathologic: Stage IIA (pT1c, pN1, cM0, G3, ER-, PR-, HER2-) - Signed by Rennie Cindy SAUNDERS, MD on 05/26/2022 Multigene prognostic tests performed: None Histologic grading system: 3 grade system   05/27/2022 - 12/07/2022 Chemotherapy   Patient is on Treatment Plan : BREAST Pembrolizumab  (200) D1 + Carboplatin  (5) D1 + Paclitaxel  (80) D1,8,15 q21d X 4 cycles / Pembrolizumab  (200) D1 + AC D1 q21d x 4 cycles     01/04/2023 - 06/21/2023 Chemotherapy   Patient is on Treatment Plan : BREAST Pembrolizumab  (200) q21d x 27 weeks       ALLERGIES:  is allergic to levaquin [levofloxacin in d5w], losartan, metoprolol, saxagliptin, amlodipine-olmesartan, canagliflozin, egg white [egg protein (egg white)], gabapentin ,  glipizide, gluten meal, lisinopril, metformin  and related, milk (cow), milk-related compounds, shrimp extract, tramadol, zyprexa  [olanzapine ], actos [pioglitazone], atorvastatin , carvedilol , and prednisone .  MEDICATIONS:  Current Outpatient Medications  Medication Sig Dispense Refill   aspirin  EC 81 MG tablet Take 1 tablet (81 mg total) by mouth daily. 30 tablet 0   atenolol  (TENORMIN ) 25 MG tablet Take 12.5 mg by mouth daily.     Azelastine  HCl 137 MCG/SPRAY SOLN PLACE 1 SPRAY INTO BOTH NOSTRILS 2 (TWO) TIMES DAILY. USE IN EACH NOSTRIL AS DIRECTED 90 mL 0   benzonatate  (TESSALON ) 100 MG capsule Take 1 capsule (100 mg total) by mouth 3 (three) times daily as needed for cough. 60 capsule 0   betamethasone dipropionate 0.05 % lotion Apply 1 Application topically daily.     betamethasone, augmented, (DIPROLENE) 0.05 % lotion Apply 1 application  topically 2 (two) times daily.     Blood Glucose Monitoring Suppl (ONETOUCH VERIO) w/Device KIT 1 Device by Does not apply route once. 1 kit 0   Cyanocobalamin (VITAMIN B-12 PO) Take 1 tablet by mouth daily.     diazepam  (VALIUM ) 2 MG tablet One pill 45-60 mins prior to procedure; 1 pill 15 mins prior if needed. 3 tablet 0   EPINEPHrine  (EPIPEN  2-PAK) 0.3 mg/0.3 mL IJ SOAJ injection Inject 0.3 mg into the muscle as needed for anaphylaxis. 2 each  0   fexofenadine (ALLEGRA) 180 MG tablet Take 180 mg by mouth daily.     glucose blood (ONETOUCH VERIO) test strip Use as instructed 100 each 12   ketoconazole (NIZORAL) 2 % shampoo Apply 1 Application topically 3 (three) times a week.     Lancets (ONETOUCH ULTRASOFT) lancets Use as instructed 100 each 3   letrozole  (FEMARA ) 2.5 MG tablet Take 1 tablet (2.5 mg total) by mouth daily. 90 tablet 1   levocetirizine (XYZAL ) 5 MG tablet TAKE 1 TABLET (5 MG TOTAL) BY MOUTH DAILY. 90 tablet 1   minoxidil  (LONITEN ) 2.5 MG tablet Take 2.5 mg by mouth daily.     montelukast  (SINGULAIR ) 10 MG tablet Take 1 tablet (10 mg total)  by mouth at bedtime. 90 tablet 1   Multiple Vitamin (MULTIVITAMIN WITH MINERALS) TABS tablet Take 1 tablet by mouth 2 (two) times a week.     omeprazole  (PRILOSEC) 20 MG capsule Take 1 capsule (20 mg total) by mouth daily. 90 capsule 1   OZEMPIC, 0.25 OR 0.5 MG/DOSE, 2 MG/1.5ML SOPN Inject 0.5 mg into the skin every Sunday.     OZEMPIC, 0.25 OR 0.5 MG/DOSE, 2 MG/3ML SOPN Inject 0.5 mg into the skin every 7 (seven) days.     polyethylene glycol (MIRALAX  / GLYCOLAX ) 17 g packet Take 17 g by mouth daily.     prednisoLONE acetate (PRED FORTE) 1 % ophthalmic suspension Place 1 drop into the left eye.     rosuvastatin  (CRESTOR ) 20 MG tablet Take 1 tablet (20 mg total) by mouth every morning. 90 tablet 1   spironolactone  (ALDACTONE ) 25 MG tablet Take 1 tablet (25 mg total) by mouth daily. 90 tablet 1   Vitamin D , Ergocalciferol , (DRISDOL ) 1.25 MG (50000 UNIT) CAPS capsule Take 50,000 Units by mouth once a week.     No current facility-administered medications for this visit.   Facility-Administered Medications Ordered in Other Visits  Medication Dose Route Frequency Provider Last Rate Last Admin   heparin  lock flush 100 UNIT/ML injection            heparin  lock flush 100 unit/mL  500 Units Intravenous Once Brahmanday, Govinda R, MD        VITAL SIGNS: There were no vitals taken for this visit. There were no vitals filed for this visit.  Estimated body mass index is 26.91 kg/m as calculated from the following:   Height as of an earlier encounter on 12/11/24: 5' 6 (1.676 m).   Weight as of an earlier encounter on 12/11/24: 75.6 kg (166 lb 11.2 oz).  LABS: CBC:    Component Value Date/Time   WBC 4.4 12/05/2024 1030   HGB 11.5 (L) 12/05/2024 1030   HGB 12.7 11/19/2024 1149   HGB 12.4 03/24/2015 1348   HCT 36.4 12/05/2024 1030   HCT 38.6 03/24/2015 1348   PLT 264 12/05/2024 1030   PLT 278 11/19/2024 1149   PLT 250 03/24/2015 1348   MCV 91.0 12/05/2024 1030   MCV 86 03/24/2015 1348    NEUTROABS 4.1 11/19/2024 1149   NEUTROABS 3.8 03/24/2015 1348   LYMPHSABS 0.8 11/19/2024 1149   LYMPHSABS 2.0 03/24/2015 1348   MONOABS 0.2 11/19/2024 1149   MONOABS 0.3 03/24/2015 1348   EOSABS 0.0 11/19/2024 1149   EOSABS 0.2 03/24/2015 1348   BASOSABS 0.0 11/19/2024 1149   BASOSABS 0.0 03/24/2015 1348   Comprehensive Metabolic Panel:    Component Value Date/Time   NA 139 11/19/2024 1149   NA 141  06/26/2019 0000   NA 137 03/24/2015 1348   K 4.2 11/19/2024 1149   K 3.5 03/24/2015 1348   CL 102 11/19/2024 1149   CL 101 03/24/2015 1348   CO2 24 11/19/2024 1149   CO2 31 03/24/2015 1348   BUN 9 11/19/2024 1149   BUN 9 06/26/2019 0000   BUN 11 03/24/2015 1348   CREATININE 0.96 11/19/2024 1149   CREATININE 0.81 12/31/2021 1005   GLUCOSE 102 (H) 11/19/2024 1149   GLUCOSE 162 (H) 03/24/2015 1348   CALCIUM  9.4 11/19/2024 1149   CALCIUM  8.7 (L) 03/24/2015 1348   AST 23 11/19/2024 1149   ALT 13 11/19/2024 1149   ALT 16 03/24/2015 1348   ALKPHOS 75 11/19/2024 1149   ALKPHOS 112 03/24/2015 1348   BILITOT 0.8 11/19/2024 1149   PROT 7.5 11/19/2024 1149   PROT 7.0 09/13/2017 1025   PROT 7.2 03/24/2015 1348   ALBUMIN 3.5 11/19/2024 1149   ALBUMIN 3.9 09/13/2017 1025   ALBUMIN 3.6 03/24/2015 1348     Present during today's visit: Patient and her husband, patient's daughter was on the phone  Start plan: Planned start 12/18/24   Patient Education I spoke with patient for overview of new oral chemotherapy medication: Ribociclib   CMP from 11/12/2024 assessed, no relevant lab abnormalities.  Patient had recent ECG on 11/12/2024, QTc F within normal limits.  Patient will need ECG rechecked on C1 D15 and C2D1.  Prescription dose and frequency assessed.    Treatment goal: Palliative  Administration: Counseled patient on administration, dosing, side effects, monitoring, drug-food interactions, safe handling, storage, and disposal. Patient will take 3 tablets (600 mg total) by mouth  daily. Take for 21 days on, 7 days off, repeat every 28 days.   Side Effects: Side effects include but not limited to: Nausea, diarrhea, fatigue, changes in liver function, decrease WBC/Hgb.   Diarrhea: patient knows to use loperamide as needed and call the office if they are having four or more loose stool per day Nausea: patient reports not having antiemetics, prescription(s) for antiemetic(s) sent to patient's local pharmacy  Drug-drug Interactions (DDI): Diazepam : Ribociclib  may increase the concentration of diazepam .  Monitor for increased diazepam  effects (i.e. sedation).  No baseline dose adjustment needed  Of note, during visit patient expressed interest in wanting to begin soursop tea and dandelion herbal.  She also drinks tea with turmeric ginger and cinnamon.  No interaction with ribociclib .  The listed herbals were added to her medication list.  Adherence: After discussion with patient no patient barriers to medication adherence identified.  Reviewed with patient importance of keeping a medication schedule and plan for any missed doses.  Distress evaluation: Distress thermometer completed during in person visit and reviewed with patient. Due to score, social work referral has not been sent.   Communication and Learning Assessment Primary learner: Patient and her husband Barriers to learning: No barriers Preferred language: English Learning preferences: Listening Reading  [facility] voiced understanding and appreciation. All questions answered. Medication handout provided.  Provided patient with Oral Chemotherapy Navigation Clinic phone number. Patient knows to call the office with questions or concerns. Oral Chemotherapy Navigation Clinic will continue to follow.  Patient expressed understanding and was in agreement with this plan. She also understands that She can call clinic at any time with any questions, concerns, or complaints.   Medication Access Issues: PA approved  co-pay $0  Follow-up plan: RTC as scheduled  Thank you for allowing me to participate in the care of this  patient.   Time Total: 30 mins  Visit consisted of counseling and education on dealing with issues of symptom management in the setting of serious and potentially life-threatening illness.Greater than 50%  of this time was spent counseling and coordinating care related to the above assessment and plan.  Signed by: Sabrena Gavitt N. Chisom Muntean, PharmD, NEILA, CPP Hematology/Oncology Clinical Pharmacist Practitioner [facility] [phone]  12/11/2024 12:50 PM  "

## 2024-12-11 NOTE — Telephone Encounter (Signed)
 Oral Oncology Patient Advocate Encounter  Patient successfully OnBoarded and drug education provided by pharmacist. Medication scheduled to be shipped on 12/17/2024 for delivery on 12/18/2024 from [facility] to patient's address. Patient also knows to call me at [phone] with any questions or concerns regarding receiving medication or if there is any unexpected change in co-pay.    Morgan Peterson (Patty) Chet Burnet, CPhT  [facility], [place] [place], [facility] - Oral Chemotherapy Patient Advocate Specialist III Phone: [phone]  Fax: [phone]

## 2024-12-12 ENCOUNTER — Other Ambulatory Visit: Payer: Self-pay | Admitting: Family Medicine

## 2024-12-12 DIAGNOSIS — J302 Other seasonal allergic rhinitis: Secondary | ICD-10-CM

## 2024-12-14 NOTE — Telephone Encounter (Signed)
 Requested Prescriptions  Pending Prescriptions Disp Refills   levocetirizine (XYZAL ) 5 MG tablet [Pharmacy Med Name: LEVOCETIRIZINE 5 MG TABLET] 90 tablet 0    Sig: TAKE 1 TABLET (5 MG TOTAL) BY MOUTH DAILY.     Ear, Nose, and Throat:  Antihistamines - levocetirizine dihydrochloride  Passed - 12/14/2024  9:22 AM      Passed - Cr in normal range and within 360 days    Creatinine  Date Value Ref Range Status  11/19/2024 0.96 0.44 - 1.00 mg/dL Final   Creat  Date Value Ref Range Status  12/31/2021 0.81 0.60 - 1.00 mg/dL Final   Creatinine, Urine  Date Value Ref Range Status  09/13/2024 180 20 - 275 mg/dL Final         Passed - eGFR is 10 or above and within 360 days    GFR, Est African American  Date Value Ref Range Status  10/09/2019 85 > OR = 60 mL/min/1.13m2 Final   GFR, Est Non African American  Date Value Ref Range Status  10/09/2019 74 > OR = 60 mL/min/1.40m2 Final   GFR, Estimated  Date Value Ref Range Status  11/19/2024 >60 >60 mL/min Final    Comment:    (NOTE) Calculated using the CKD-EPI Creatinine Equation (2021)    eGFR  Date Value Ref Range Status  12/31/2021 78 > OR = 60 mL/min/1.107m2 Final    Comment:    The eGFR is based on the CKD-EPI 2021 equation. To calculate  the new eGFR from a previous Creatinine or Cystatin C result, go to https://www.kidney.org/professionals/ kdoqi/gfr%5Fcalculator          Passed - Valid encounter within last 12 months    Recent Outpatient Visits           2 months ago Nodule of neck   [place] [facility] [place], Dorette, MD   3 months ago Dyslipidemia associated with type 2 diabetes mellitus [facility])   [name] Dorette, MD   7 months ago Decreased calculated GFR   [name] Dorette, MD   9 months ago Dyslipidemia associated with type 2 diabetes mellitus [facility])   [facility] Sowles, [name], MD   10  months ago Sore throat   [facility] Sowles, [name], MD

## 2024-12-15 ENCOUNTER — Encounter: Payer: Self-pay | Admitting: Internal Medicine

## 2024-12-17 ENCOUNTER — Other Ambulatory Visit: Payer: Self-pay

## 2024-12-24 ENCOUNTER — Other Ambulatory Visit: Payer: Self-pay | Admitting: *Deleted

## 2024-12-24 ENCOUNTER — Other Ambulatory Visit (HOSPITAL_COMMUNITY): Payer: Self-pay

## 2024-12-24 ENCOUNTER — Telehealth: Payer: Self-pay | Admitting: Pharmacy Technician

## 2024-12-24 ENCOUNTER — Encounter: Payer: Self-pay | Admitting: Internal Medicine

## 2024-12-24 DIAGNOSIS — Z171 Estrogen receptor negative status [ER-]: Secondary | ICD-10-CM

## 2024-12-24 DIAGNOSIS — J90 Pleural effusion, not elsewhere classified: Secondary | ICD-10-CM

## 2024-12-24 NOTE — Telephone Encounter (Signed)
 Oral Oncology Patient Advocate Encounter   Was successful in securing patient a $76 grant from [employer] (PA[employer]) to provide copayment coverage for kisqali .  This will keep the out of pocket expense at $0.     The billing information is as follows and has been shared with [facility].   RxBin: [id number] PCN:  [id number] Member ID: [id number] Group ID: [id number] Dates of Eligibility: 06/27/2024 through 12/24/2025    [name] Chet Burnet, CPhT  [facility], Zelda Salmon, [facility] - Oral Chemotherapy Patient Advocate Specialist III Phone: [phone]  Fax: [phone]

## 2024-12-24 NOTE — Telephone Encounter (Signed)
 " Caller verified using pt's full name and dob prior to discussing PHI    Pt requesting to be scheduled for a Thoracentesis     NURSING SYMPTOM TRIAGE ASSESSMENT & DISPOSITION  Mode of interaction:  Telephone Date of symptom triage interaction:  12/24/2024 Time of symptom triage interaction:  [id number]  Cancer diagnosis:  Malignant neoplasm of overlapping sites of right breast in female, estrogen receptor negative (HCC)  Current anti-cancer treatment:    Oral chemotherapy:  Yes, prescription for Kisquali with last dose taken this morning. (She is tolerating the medication)  Symptom Triage Protocol:  Dyspnea  History of the problem:  Dyspnea  Attempt to obtain patient reported VS: Home Pulse Oximetry:  95-97% RA, Respiratory rate:  18, Heart Rate:  89, Blood Pressure: 135/86. Pt took vitals while I had her on the phone.   Adventitious respiratory sounds heard by phone:   Associated symptoms: Dyspnea   Patient reported cyanosis or pallor: No  Timing of symptoms: Symptoms started on Saturday.  Aggravating factors: Movement and prolonged ambulation makes shortness of worse.  Are these acute or chronic symptoms: Chronic;   Patient reported changes in mental status: no  Changes in ADLs: none     Smoking history: Never smoker     Additional commentary: Patient requesting thoracentesis. Last Kae was performed on 11/28/2024     Triage Nurse Guidance Signs and Symptoms Action  Sudden, unexpected increase in dyspnea at rest Chest pain Frothy pink sputum or gross hemoptysis Facial swelling Change in mental status Seek emergency care. Call an ambulance immediately.  Temperature higher than 100.330F (38C) with suspected neutropenia Seek emergency care.   Increasing dyspnea with activity Temperature higher than 101.80F (38.6C) with-out suspected neutropenia Increased edema or swelling Change in cough or sputum production Uncontrollable cough New-onset wheezing Seek medical care  within 24 hours.  Cough Shortness of breath Follow homecare instructions and seek medical care if no improvement within 24-48 hours.   Provider Consulted  Provider name and credentials: Dr. Anibal and [facility] APPs  Provider instruction: Please schedule thoracentesis per Dr. Rennie     Patient Instruction  Disclaimer:  Patient specific and Elsevier instruction provided verbally and sent through MyChart for active users.    Comply with medical therapy (e.g., antibiotics, antitussives, bronchodilators, opioids), respiratory treatment, oxygen, as prescribed.  Schedule activities that require more exertion (e.g., bathing) around periods of rest.  Get adequate sleep and rest.  Drink plenty (1-2 L) of fluids (unless restricted because of cardiac dysfunction or kidney disease) to help thin out secretions (unless underlying congestive heart failure is present).  Avoid precipitating factors that worsen dyspnea (e.g., anxiety, perfumes, tobacco smoke, cold or dry air). Stay inside on days when the air quality is poor.  Monitor for fever or any sign of infection and avoid contact with individuals who are sick.  Sitting upright and pursed-lip breathing can lessen dyspnea, as can relaxation training.  Report the Following Problems: Fever (temperature higher than 101.80F [38.6C], or 100.330F [38.1C] if receiving chemotherapy or if neutropenia is suspected) Change in sputum production (color, hemoptysis) Swelling of the feet or hands  Seek Emergency Care Immediately if Any of the Following Occurs: Worsening dyspnea, especially if accompanied by chest pain or gross hemoptysis Swelling of the face Increased work of breathing (elevated respiratory or heart rate) Changes in mental status (somnolence, restlessness, confusion) Temperature higher than 100.330F (38C) with known neutropenia  Teach Back Method used:  Yes    Nurse Triage Priority:  Urgent (obtain  medical orders as indicated  with instruction for 8-24 hour or sooner follow-up)   Barriers to Care:  None identified   Nurse Triage Disposition: Thoracentesis-outpatient. Sent mychart msg to patient regarding plan of care and also sent patient the rn triage guidance protocols.  Protocol Source:  Ruthine HERO., & Eldonna CANDIE Pee.). (2019). Telephone triage for oncology nurses (3rd ed.). Oncology Nursing Society.  "

## 2024-12-26 ENCOUNTER — Ambulatory Visit
Admission: RE | Admit: 2024-12-26 | Discharge: 2024-12-26 | Disposition: A | Discharge status: Home / Self Care | Source: Ambulatory Visit | Attending: Radiology | Admitting: Radiology

## 2024-12-26 ENCOUNTER — Ambulatory Visit
Admission: RE | Admit: 2024-12-26 | Discharge: 2024-12-26 | Disposition: A | Discharge status: Home / Self Care | Source: Ambulatory Visit | Attending: Internal Medicine | Admitting: Internal Medicine

## 2024-12-26 ENCOUNTER — Other Ambulatory Visit: Payer: Self-pay | Admitting: Radiology

## 2024-12-26 DIAGNOSIS — J90 Pleural effusion, not elsewhere classified: Secondary | ICD-10-CM | POA: Diagnosis present

## 2024-12-26 DIAGNOSIS — Z9889 Other specified postprocedural states: Secondary | ICD-10-CM

## 2024-12-26 DIAGNOSIS — K449 Diaphragmatic hernia without obstruction or gangrene: Secondary | ICD-10-CM | POA: Diagnosis not present

## 2024-12-26 DIAGNOSIS — Z853 Personal history of malignant neoplasm of breast: Secondary | ICD-10-CM | POA: Insufficient documentation

## 2024-12-26 DIAGNOSIS — C782 Secondary malignant neoplasm of pleura: Secondary | ICD-10-CM | POA: Diagnosis not present

## 2024-12-26 MED ORDER — LIDOCAINE HCL (PF) 1 % IJ SOLN
10.0000 mL | Freq: Once | INTRAMUSCULAR | Status: AC
  Administered 2024-12-26: 10 mL via INTRADERMAL

## 2024-12-26 NOTE — Procedures (Signed)
 PROCEDURE SUMMARY:  Successful US  guided left-sided thoracentesis. Yielded 1.4L of bloody fluid. Patient tolerated procedure well. No immediate complications. EBL = trace  Post procedure chest X-ray reveals no pneumothorax  Morgan Peterson CHRISTELLA Bal PA-C 12/26/2024 12:20 PM

## 2024-12-31 ENCOUNTER — Other Ambulatory Visit: Payer: Self-pay

## 2025-01-01 ENCOUNTER — Inpatient Hospital Stay: Admitting: Pharmacist

## 2025-01-01 ENCOUNTER — Inpatient Hospital Stay: Attending: Internal Medicine | Admitting: Internal Medicine

## 2025-01-01 ENCOUNTER — Inpatient Hospital Stay

## 2025-01-01 ENCOUNTER — Encounter: Payer: Self-pay | Admitting: Internal Medicine

## 2025-01-01 VITALS — BP 138/81 | HR 97 | Temp 98.5°F | Resp 16 | Ht 66.0 in | Wt 163.2 lb

## 2025-01-01 DIAGNOSIS — Z79811 Long term (current) use of aromatase inhibitors: Secondary | ICD-10-CM | POA: Insufficient documentation

## 2025-01-01 DIAGNOSIS — Z1732 Human epidermal growth factor receptor 2 negative status: Secondary | ICD-10-CM | POA: Diagnosis not present

## 2025-01-01 DIAGNOSIS — Z87891 Personal history of nicotine dependence: Secondary | ICD-10-CM | POA: Diagnosis not present

## 2025-01-01 DIAGNOSIS — Z9221 Personal history of antineoplastic chemotherapy: Secondary | ICD-10-CM | POA: Insufficient documentation

## 2025-01-01 DIAGNOSIS — Z79899 Other long term (current) drug therapy: Secondary | ICD-10-CM | POA: Diagnosis not present

## 2025-01-01 DIAGNOSIS — Z923 Personal history of irradiation: Secondary | ICD-10-CM | POA: Diagnosis not present

## 2025-01-01 DIAGNOSIS — Z1721 Progesterone receptor positive status: Secondary | ICD-10-CM | POA: Diagnosis not present

## 2025-01-01 DIAGNOSIS — Z170 Estrogen receptor positive status [ER+]: Secondary | ICD-10-CM | POA: Diagnosis not present

## 2025-01-01 DIAGNOSIS — Z171 Estrogen receptor negative status [ER-]: Secondary | ICD-10-CM

## 2025-01-01 DIAGNOSIS — C773 Secondary and unspecified malignant neoplasm of axilla and upper limb lymph nodes: Secondary | ICD-10-CM | POA: Insufficient documentation

## 2025-01-01 DIAGNOSIS — C50411 Malignant neoplasm of upper-outer quadrant of right female breast: Secondary | ICD-10-CM | POA: Diagnosis not present

## 2025-01-01 LAB — CBC WITH DIFFERENTIAL (CANCER CENTER ONLY)
Abs Immature Granulocytes: 0.03 K/uL (ref 0.00–0.07)
Basophils Absolute: 0 K/uL (ref 0.0–0.1)
Basophils Relative: 1 %
Eosinophils Absolute: 0 K/uL (ref 0.0–0.5)
Eosinophils Relative: 1 %
HCT: 37.5 % (ref 36.0–46.0)
Hemoglobin: 11.8 g/dL — ABNORMAL LOW (ref 12.0–15.0)
Immature Granulocytes: 1 %
Lymphocytes Relative: 40 %
Lymphs Abs: 1 K/uL (ref 0.7–4.0)
MCH: 29.1 pg (ref 26.0–34.0)
MCHC: 31.5 g/dL (ref 30.0–36.0)
MCV: 92.6 fL (ref 80.0–100.0)
Monocytes Absolute: 0.1 K/uL (ref 0.1–1.0)
Monocytes Relative: 2 %
Neutro Abs: 1.4 K/uL — ABNORMAL LOW (ref 1.7–7.7)
Neutrophils Relative %: 55 %
Platelet Count: 170 K/uL (ref 150–400)
RBC: 4.05 MIL/uL (ref 3.87–5.11)
RDW: 13.7 % (ref 11.5–15.5)
Smear Review: NORMAL
WBC Count: 2.5 K/uL — ABNORMAL LOW (ref 4.0–10.5)
nRBC: 0 % (ref 0.0–0.2)

## 2025-01-01 LAB — CMP (CANCER CENTER ONLY)
ALT: 15 U/L (ref 0–44)
AST: 23 U/L (ref 15–41)
Albumin: 3.8 g/dL (ref 3.5–5.0)
Alkaline Phosphatase: 99 U/L (ref 38–126)
Anion gap: 13 (ref 5–15)
BUN: 13 mg/dL (ref 8–23)
CO2: 24 mmol/L (ref 22–32)
Calcium: 9.8 mg/dL (ref 8.9–10.3)
Chloride: 102 mmol/L (ref 98–111)
Creatinine: 1.1 mg/dL — ABNORMAL HIGH (ref 0.44–1.00)
GFR, Estimated: 53 mL/min — ABNORMAL LOW
Glucose, Bld: 134 mg/dL — ABNORMAL HIGH (ref 70–99)
Potassium: 4.9 mmol/L (ref 3.5–5.1)
Sodium: 138 mmol/L (ref 135–145)
Total Bilirubin: 0.5 mg/dL (ref 0.0–1.2)
Total Protein: 7.2 g/dL (ref 6.5–8.1)

## 2025-01-01 MED ORDER — ONDANSETRON HCL 8 MG PO TABS: 8.0000 mg | ORAL_TABLET | Freq: Three times a day (TID) | ORAL | 0 refills | Status: AC | PRN

## 2025-01-01 NOTE — Progress Notes (Signed)
 "  Clinical Pharmacist Practitioner Clinic [facility]  [phone] [phone]  Patient Care Team: Sowles, [name], MD as PCP - General (Family Medicine) Dessa, Reyes ORN, MD (General Surgery) [name], MD as Referring Physician (Otolaryngology) Florencio Cara BIRCH, MD as Consulting Physician (Cardiology) Cathlyn Seal, MD as Referring Physician (Dermatology) Cherilyn Debby CROME, MD as Consulting Physician (Internal Medicine) Rennie Cindy SAUNDERS, MD as Consulting Physician (Oncology) Tye Millet, DO as Consulting Physician (General Surgery) P[facility], [facility]   Name of the patient: Morgan Peterson  [id number]  Jun 30, 1951   Date of visit: 01/01/2025  HPI: Patient is a 74 y.o. female with metastatic breast cancer, ER/PR positive and HER2 negative.  Patient currently being treated with Ribociclib  and letrozole .  Patient started ribociclib  on 12/18/24.   Reason for Consult: Oral chemotherapy follow-up for ribociclib  therapy.   PAST MEDICAL HISTORY: Past Medical History:  Diagnosis Date   Appetite loss    Asthma    Cervical radiculopathy    Chemotherapy-induced nausea    Hiatal hernia    Hypercholesterolemia    Hyperlipidemia    Hypertension    IBS (irritable bowel syndrome)    Malignant neoplasm of upper-outer quadrant of female breast (HCC) 04/11/2009   Right breast, invasive ductal carcinoma, 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 Arimidex therapy completed September 2015   Mild mitral valve prolapse    Neuropathy    feet, from chemo   Personal history of malignant neoplasm of breast 2010   Personal history of radiation therapy 2010   mammosite   T2DM (type 2 diabetes mellitus) (HCC) 2011    HEMATOLOGY/ONCOLOGY HISTORY:  Oncology History Overview Note  # 2010- She has a remote history of right breast cancer, T1bN0, ER positive,s/p lumpectomy and MammoSite  radiation and 5 years of Arimidex. No chemo. []    02/19/22 screening mammogram bilaterally showed  1. Suspicious right breast mass at the 10 o'clock position 8 cm from the nipple. Recommend ultrasound-guided biopsy. 2. Indeterminate right breast mass at the 6 o'clock position 4 cm from the nipple. Recommend ultrasound-guided biopsy. 3. No suspicious right axillary lymphadenopathy.   04/12/2022 Unilateral right breast diagnostic mammogram showed 1. Suspicious right breast mass at the 10 o'clock position 8 cm from the nipple. Recommend ultrasound-guided biopsy. 2. Indeterminate right breast mass at the 6 o'clock position 4 cm from the nipple. Recommend ultrasound-guided biopsy. 3. No suspicious right axillary lymphadenopathy.   04/13/2022 right breast mass 6 o'clock position 1 cm from the nipple showed invasive mammary carcinoma, no special type. Grade 3, DCIS present high grade, LVI not identified. ER-, PR 1-10%, HER 2 -   04/13/2022, right breast mass 10:00 7 cm from nipple biopsy showed invasive mammary carcinoma, grade 3, high-grade DCIS with focal comedonecrosis, lymphovascular invasion not identified.  ER -, PR 1 to 10%, HER2-    04/26/2022, right breast mastectomy with axillary dissection Invasive mammary carcinoma, no special type, multifocal, DCIS high-grade, benign nipple/areola.  2 deposits of invasive mammary carcinoma, no definite residual lymph node identified.  NOV 2025- CT chest-  Large left pleural effusion with pleural nodularity, consistent with pleural metastasis; Tiny right pleural effusion with findings suspicious for right pleural and pulmonary parenchymal metastases including nodularity along the right major fissure and scattered right lung nodules up to 4 mm; Right axillary nodal metastasis in the setting of prior right mastectomy.- s/p thora-; CT GUIDED Bx-invasive mammary carcinoma-ER/PR positive HER2 negative-0.  The tumor cells are NEGATIVE for Her2 (0). Estrogen Receptor:  100%,  POSITIVE, STRONG STAINING INTENSITY Progesterone Receptor:  80%, POSITIVE, STRONG STAINING INTENSITY Proliferation Marker Ki67:  20%   # +DEC 30th 2025- letrozole  plus Kisqali -    Breast cancer (HCC)  05/10/2022 Initial Diagnosis   Breast cancer (HCC)   05/10/2022 Cancer Staging   Staging form: Breast, AJCC 8th Edition - Pathologic stage from 05/10/2022: Stage IIA (pT1c, pN1a, cM0, G3, ER-, PR+, HER2-) - Signed by Babara Call, MD on 05/10/2022 Stage prefix: Initial diagnosis Histologic grading system: 3 grade system   05/24/2022 - 05/24/2022 Chemotherapy   Patient is on Treatment Plan : BREAST Pembrolizumab  (200) D1 + Carboplatin  (5) D1 + Paclitaxel  (80) D1,8,15 q21d X 4 cycles / Pembrolizumab  (200) D1 + AC D1 q21d x 4 cycles     05/27/2022 - 08/13/2022 Chemotherapy   Patient is on Treatment Plan : BREAST Pembrolizumab  (200) D1 + Carboplatin  (5) D1 + Paclitaxel  (80) D1,8,15 q21d X 4 cycles / Pembrolizumab  (200) D1 + AC D1 q21d x 4 cycles     05/27/2022 - 12/07/2022 Chemotherapy   Patient is on Treatment Plan : BREAST Pembrolizumab  (200) D1 + Carboplatin  (5) D1 + Paclitaxel  (80) D1,8,15 q21d X 4 cycles / Pembrolizumab  (200) D1 + AC D1 q21d x 4 cycles     01/04/2023 - 06/21/2023 Chemotherapy   Patient is on Treatment Plan : BREAST Pembrolizumab  (200) q21d x 27 weeks     Carcinoma of upper-outer quadrant of right breast in female, estrogen receptor negative (HCC)  05/26/2022 Initial Diagnosis   Carcinoma of upper-outer quadrant of right breast in female, estrogen receptor negative (HCC)   05/26/2022 Cancer Staging   Staging form: Breast, AJCC 8th Edition - Pathologic: Stage IIA (pT1c, pN1, cM0, G3, ER-, PR-, HER2-) - Signed by Rennie Cindy SAUNDERS, MD on 05/26/2022 Multigene prognostic tests performed: None Histologic grading system: 3 grade system   05/27/2022 - 12/07/2022 Chemotherapy   Patient is on Treatment Plan : BREAST Pembrolizumab  (200) D1 + Carboplatin  (5) D1 + Paclitaxel  (80) D1,8,15 q21d X 4  cycles / Pembrolizumab  (200) D1 + AC D1 q21d x 4 cycles     01/04/2023 - 06/21/2023 Chemotherapy   Patient is on Treatment Plan : BREAST Pembrolizumab  (200) q21d x 27 weeks       ALLERGIES:  is allergic to levaquin [levofloxacin in d5w], losartan, metoprolol, saxagliptin, amlodipine-olmesartan, canagliflozin, egg white [egg protein (egg white)], gabapentin , glipizide, gluten meal, lisinopril, metformin  and related, milk (cow), milk-related compounds, shrimp extract, tramadol, zyprexa  [olanzapine ], actos [pioglitazone], atorvastatin , carvedilol , and prednisone .  MEDICATIONS:  Current Outpatient Medications  Medication Sig Dispense Refill   aspirin  EC 81 MG tablet Take 1 tablet (81 mg total) by mouth daily. 30 tablet 0   atenolol  (TENORMIN ) 25 MG tablet Take 12.5 mg by mouth daily.     Azelastine  HCl 137 MCG/SPRAY SOLN PLACE 1 SPRAY INTO BOTH NOSTRILS 2 (TWO) TIMES DAILY. USE IN EACH NOSTRIL AS DIRECTED 90 mL 0   benzonatate  (TESSALON ) 100 MG capsule Take 1 capsule (100 mg total) by mouth 3 (three) times daily as needed for cough. 60 capsule 0   betamethasone dipropionate 0.05 % lotion Apply 1 Application topically daily.     betamethasone, augmented, (DIPROLENE) 0.05 % lotion Apply 1 application  topically 2 (two) times daily.     Blood Glucose Monitoring Suppl (ONETOUCH VERIO) w/Device KIT 1 Device by Does not apply route once. 1 kit 0   Cyanocobalamin (VITAMIN B-12 PO)  Take 1 tablet by mouth daily.     diazepam  (VALIUM ) 2 MG tablet One pill 45-60 mins prior to procedure; 1 pill 15 mins prior if needed. 3 tablet 0   EPINEPHrine  (EPIPEN  2-PAK) 0.3 mg/0.3 mL IJ SOAJ injection Inject 0.3 mg into the muscle as needed for anaphylaxis. 2 each 0   fexofenadine (ALLEGRA) 180 MG tablet Take 180 mg by mouth daily.     glucose blood (ONETOUCH VERIO) test strip Use as instructed 100 each 12   ketoconazole (NIZORAL) 2 % shampoo Apply 1 Application topically 3 (three) times a week.     Lancets (ONETOUCH  ULTRASOFT) lancets Use as instructed 100 each 3   letrozole  (FEMARA ) 2.5 MG tablet Take 1 tablet (2.5 mg total) by mouth daily. 90 tablet 1   levocetirizine (XYZAL ) 5 MG tablet TAKE 1 TABLET (5 MG TOTAL) BY MOUTH DAILY. 90 tablet 0   minoxidil  (LONITEN ) 2.5 MG tablet Take 2.5 mg by mouth daily.     montelukast  (SINGULAIR ) 10 MG tablet Take 1 tablet (10 mg total) by mouth at bedtime. 90 tablet 1   Multiple Vitamin (MULTIVITAMIN WITH MINERALS) TABS tablet Take 1 tablet by mouth 2 (two) times a week.     NON FORMULARY Soursop Tea     NON FORMULARY Dandelion tea     NON FORMULARY Turmeric cinnamon ginger tea     omeprazole  (PRILOSEC) 20 MG capsule Take 1 capsule (20 mg total) by mouth daily. 90 capsule 1   ondansetron  (ZOFRAN ) 8 MG tablet Take 1 tablet (8 mg total) by mouth every 8 (eight) hours as needed for nausea or vomiting. 90 tablet 0   OZEMPIC, 0.25 OR 0.5 MG/DOSE, 2 MG/1.5ML SOPN Inject 0.5 mg into the skin every Sunday.     OZEMPIC, 0.25 OR 0.5 MG/DOSE, 2 MG/3ML SOPN Inject 0.5 mg into the skin every 7 (seven) days.     polyethylene glycol (MIRALAX  / GLYCOLAX ) 17 g packet Take 17 g by mouth daily.     prednisoLONE acetate (PRED FORTE) 1 % ophthalmic suspension Place 1 drop into the left eye.     prochlorperazine  (COMPAZINE ) 10 MG tablet Take 1 tablet (10 mg total) by mouth every 6 (six) hours as needed for nausea or vomiting. 30 tablet 2   ribociclib  succ (KISQALI  600MG  DAILY DOSE) 200 MG Therapy Pack Take 3 tablets (600 mg total) by mouth daily. Take for 21 days on, 7 days off, repeat every 28 days. 63 tablet 1   rosuvastatin  (CRESTOR ) 20 MG tablet Take 1 tablet (20 mg total) by mouth every morning. 90 tablet 1   spironolactone  (ALDACTONE ) 25 MG tablet Take 1 tablet (25 mg total) by mouth daily. 90 tablet 1   Vitamin D , Ergocalciferol , (DRISDOL ) 1.25 MG (50000 UNIT) CAPS capsule Take 50,000 Units by mouth once a week.     No current facility-administered medications for this visit.    Facility-Administered Medications Ordered in Other Visits  Medication Dose Route Frequency Provider Last Rate Last Admin   heparin  lock flush 100 UNIT/ML injection            heparin  lock flush 100 unit/mL  500 Units Intravenous Once Brahmanday, Govinda R, MD        VITAL SIGNS: There were no vitals taken for this visit. There were no vitals filed for this visit.  Estimated body mass index is 26.34 kg/m as calculated from the following:   Height as of an earlier encounter on 01/01/25: 5' 6 (1.676 m).  Weight as of an earlier encounter on 01/01/25: 74 kg (163 lb 3.2 oz).  LABS: CBC:    Component Value Date/Time   WBC 2.5 (L) 01/01/2025 1318   WBC 4.4 12/05/2024 1030   HGB 11.8 (L) 01/01/2025 1318   HGB 12.4 03/24/2015 1348   HCT 37.5 01/01/2025 1318   HCT 38.6 03/24/2015 1348   PLT 170 01/01/2025 1318   PLT 250 03/24/2015 1348   MCV 92.6 01/01/2025 1318   MCV 86 03/24/2015 1348   NEUTROABS 1.4 (L) 01/01/2025 1318   NEUTROABS 3.8 03/24/2015 1348   LYMPHSABS 1.0 01/01/2025 1318   LYMPHSABS 2.0 03/24/2015 1348   MONOABS 0.1 01/01/2025 1318   MONOABS 0.3 03/24/2015 1348   EOSABS 0.0 01/01/2025 1318   EOSABS 0.2 03/24/2015 1348   BASOSABS 0.0 01/01/2025 1318   BASOSABS 0.0 03/24/2015 1348   Comprehensive Metabolic Panel:    Component Value Date/Time   NA 138 01/01/2025 1317   NA 141 06/26/2019 0000   NA 137 03/24/2015 1348   K 4.9 01/01/2025 1317   K 3.5 03/24/2015 1348   CL 102 01/01/2025 1317   CL 101 03/24/2015 1348   CO2 24 01/01/2025 1317   CO2 31 03/24/2015 1348   BUN 13 01/01/2025 1317   BUN 9 06/26/2019 0000   BUN 11 03/24/2015 1348   CREATININE 1.10 (H) 01/01/2025 1317   CREATININE 0.81 12/31/2021 1005   GLUCOSE 134 (H) 01/01/2025 1317   GLUCOSE 162 (H) 03/24/2015 1348   CALCIUM  9.8 01/01/2025 1317   CALCIUM  8.7 (L) 03/24/2015 1348   AST 23 01/01/2025 1317   ALT 15 01/01/2025 1317   ALT 16 03/24/2015 1348   ALKPHOS 99 01/01/2025 1317   ALKPHOS  112 03/24/2015 1348   BILITOT 0.5 01/01/2025 1317   PROT 7.2 01/01/2025 1317   PROT 7.0 09/13/2017 1025   PROT 7.2 03/24/2015 1348   ALBUMIN 3.8 01/01/2025 1317   ALBUMIN 3.9 09/13/2017 1025   ALBUMIN 3.6 03/24/2015 1348     Present during today's visit: Patient only  Assessment and Plan: CBC/CMP reviewed, continue Ribociclib  600 mg daily 21 days on 7 days off.  Today patient is C1D15. Day 15 ECG checked today, QTc F within normal limits.  Patient will have ECG checked on C1 D1, if this ECG is within normal limits no further monitoring needed. Of note MD sending ondansetron  for patient as prochlorperazine  caused patient to have dry mouth and sleepiness.  MDM patient aware of QTc risk with ondansetron .  Upcoming ECG will be helpful for evaluating combination use. Patient had thoracentesis on 12/26/2024.  They removed 1.4 L of pleural fluid.  Patient reports a decrease in shortness of breath following that procedure. Overall patient is tolerating ribociclib  well   Oral Chemotherapy Side Effect/Intolerance:  No reported diarrhea or significant changes in fatigue Nausea: As mentioned above patient had side effects with prochlorperazine  and MD switching to ondansetron  for nausea management.  Patient reports having nausea daily after taking her ribociclib , she knows she can take her ondansetron  30 to 60 minutes prior to ribociclib  to help prevent nausea.  Oral Chemotherapy Adherence: No missed doses reported No patient barriers to medication adherence identified.   New medications: None reported  Medication Access Issues: No issues, patient feels at [facility]Special[facility]).  Patty obtain grant to help cover cost of medication for 2026 from PAF  Patient expressed understanding and was in agreement with this plan. She also understands that She can call clinic at  any time with any questions, concerns, or complaints.   Follow-up plan: RTC in 2 weeks  Thank you for allowing me to  participate in the care of this very pleasant patient.   Time Total: 15 minutes  Visit consisted of counseling and education on dealing with issues of symptom management in the setting of serious and potentially life-threatening illness.Greater than 50%  of this time was spent counseling and coordinating care related to the above assessment and plan.  Signed by: Abbygael Curtiss N. Omunique Pederson, PharmD, NEILA, CPP Hematology/Oncology Clinical Pharmacist Practitioner [facility] [phone]  01/01/2025 4:13 PM  "

## 2025-01-01 NOTE — Progress Notes (Signed)
 C/o being tired and weak and dry mouth since 2nd week of kisqali . Having nausea after taking rx. She is asking if the dosage can be lowered?  THORACENTESIS 12/26/24.

## 2025-01-01 NOTE — Assessment & Plan Note (Addendum)
#    STAGE IV- NOV 2025- CT chest-  Large left pleural effusion with pleural nodularity, consistent with pleural metastasis; Tiny right pleural effusion with findings suspicious for right pleural and pulmonary parenchymal metastases including nodularity along the right major fissure and scattered right lung nodules up to 4 mm; Right axillary nodal metastasis in the setting of prior right mastectomy.  Recommend PET scan ASAP.  Also discussed regarding possible need for a biopsy of the right axillary lymph node based upon the results of PET scan.Pleura, biopsy:  METASTATIC ADENOCARCINOMA, CONSISTENT WITH PATIENT'S CLINICAL HISTORY OF       PRIMARY BREAST CARCINOMA,  The tumor cells are NEGATIVE for Her2 (0). Estrogen Receptor:  100%, POSITIVE, Progesterone Receptor:  80%, POSITIVE Proliferation Marker Ki67:  20%. DEC 2025- PET scan: Extensive hypermetabolic nodularity throughout the left pleural space consistent with known pleural metastatic disease. Persistent large left pleural effusion with near complete collapse of the left lung and mediastinal shift to the right. Hypermetabolic mediastinal, left hilar and left internal mammary lymphadenopathy consistent with metastatic disease. No suspicious axillary nodal involvement.  Previously demonstrated right pleural and pulmonary nodularity is not associated with significant hypermetabolic activity, although is suboptimally evaluated due to small size. Scattered small sclerotic lesions within the lumbar spine and bony pelvis with low level metabolic activity, suspicious for metastatic disease. No evidence of extra osseous metastatic disease in the abdomen or pelvis.  # Currently on letrozole ; along with Kisqali - tolerating well except for nasuea/and dry mouth.  2-weeks EKG done today.  Labs-unremarkable.  # Nausea- G-2- recommend prophylactic Zofran - new script sent-   # Dry mouth drowsiness-likely anticholinergic side effects of Compazine .  Switch over to  Zofran .  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  Monitor blood sugars closely. Stable  # Bone health- April 2024- BMD measured at [facility] is 0.879 g/cm2 with a T-score of -1.1.Stable   # IV access- s/p  Explantation-PIV.   # DISPOSITION: # EKG today # Follow up in  in 2   weeks- MD; labs- cbc/cmp; EKG-; ca 27-29; Ca 15-3-Dr.B

## 2025-01-01 NOTE — Progress Notes (Signed)
  Cancer Center CONSULT NOTE  Patient Care Team: Sowles, [name], MD as PCP - General (Family Medicine) Dessa, Reyes ORN, MD (General Surgery) [name], MD as Referring Physician (Otolaryngology) Florencio Cara BIRCH, MD as Consulting Physician (Cardiology) Cathlyn Seal, MD as Referring Physician (Dermatology) Cherilyn Debby CROME, MD as Consulting Physician (Internal Medicine) Rennie Cindy SAUNDERS, MD as Consulting Physician (Oncology) Tye Millet, DO as Consulting Physician (General Surgery) [facility]  CHIEF COMPLAINTS/PURPOSE OF CONSULTATION: Breast cancer  #  Oncology History Overview Note  She has a remote history of right breast cancer, T1bN0, ER positive,s/p lumpectomy and MammoSite radiation and 5 years of Arimidex.    02/19/22 screening mammogram bilaterally showed  1. Suspicious right breast mass at the 10 o'clock position 8 cm from the nipple. Recommend ultrasound-guided biopsy. 2. Indeterminate right breast mass at the 6 o'clock position 4 cm from the nipple. Recommend ultrasound-guided biopsy. 3. No suspicious right axillary lymphadenopathy.   04/12/2022 Unilateral right breast diagnostic mammogram showed 1. Suspicious right breast mass at the 10 o'clock position 8 cm from the nipple. Recommend ultrasound-guided biopsy. 2. Indeterminate right breast mass at the 6 o'clock position 4 cm from the nipple. Recommend ultrasound-guided biopsy. 3. No suspicious right axillary lymphadenopathy.   04/13/2022 right breast mass 6 o'clock position 1 cm from the nipple showed invasive mammary carcinoma, no special type. Grade 3, DCIS present high grade, LVI not identified. ER-, PR 1-10%, HER 2 -   04/13/2022, right breast mass 10:00 7 cm from nipple biopsy showed invasive mammary carcinoma, grade 3, high-grade DCIS with focal comedonecrosis, lymphovascular invasion not identified.  ER -, PR 1 to 10%, HER2-    04/26/2022, right breast mastectomy with  axillary dissection Invasive mammary carcinoma, no special type, multifocal, DCIS high-grade, benign nipple/areola.  2 deposits of invasive mammary carcinoma, no definite residual lymph node identified.  NOV 2025- CT chest-  Large left pleural effusion with pleural nodularity, consistent with pleural metastasis; Tiny right pleural effusion with findings suspicious for right pleural and pulmonary parenchymal metastases including nodularity along the right major fissure and scattered right lung nodules up to 4 mm; Right axillary nodal metastasis in the setting of prior right mastectomy.- s/p thora-    Breast cancer (HCC)  05/10/2022 Initial Diagnosis   Breast cancer (HCC)   05/10/2022 Cancer Staging   Staging form: Breast, AJCC 8th Edition - Pathologic stage from 05/10/2022: Stage IIA (pT1c, pN1a, cM0, G3, ER-, PR+, HER2-) - Signed by Babara Call, MD on 05/10/2022 Stage prefix: Initial diagnosis Histologic grading system: 3 grade system   05/24/2022 - 05/24/2022 Chemotherapy   Patient is on Treatment Plan : BREAST Pembrolizumab  (200) D1 + Carboplatin  (5) D1 + Paclitaxel  (80) D1,8,15 q21d X 4 cycles / Pembrolizumab  (200) D1 + AC D1 q21d x 4 cycles     05/27/2022 - 08/13/2022 Chemotherapy   Patient is on Treatment Plan : BREAST Pembrolizumab  (200) D1 + Carboplatin  (5) D1 + Paclitaxel  (80) D1,8,15 q21d X 4 cycles / Pembrolizumab  (200) D1 + AC D1 q21d x 4 cycles     05/27/2022 - 12/07/2022 Chemotherapy   Patient is on Treatment Plan : BREAST Pembrolizumab  (200) D1 + Carboplatin  (5) D1 + Paclitaxel  (80) D1,8,15 q21d X 4 cycles / Pembrolizumab  (200) D1 + AC D1 q21d x 4 cycles     01/04/2023 - 06/21/2023 Chemotherapy   Patient is on Treatment Plan : BREAST Pembrolizumab  (200) q21d x 27 weeks     Carcinoma of upper-outer quadrant of  right breast in female, estrogen receptor negative (HCC)  05/26/2022 Initial Diagnosis   Carcinoma of upper-outer quadrant of right breast in female, estrogen receptor negative (HCC)    05/26/2022 Cancer Staging   Staging form: Breast, AJCC 8th Edition - Pathologic: Stage IIA (pT1c, pN1, cM0, G3, ER-, PR-, HER2-) - Signed by Rennie Cindy SAUNDERS, MD on 05/26/2022 Multigene prognostic tests performed: None Histologic grading system: 3 grade system   05/27/2022 - 12/07/2022 Chemotherapy   Patient is on Treatment Plan : BREAST Pembrolizumab  (200) D1 + Carboplatin  (5) D1 + Paclitaxel  (80) D1,8,15 q21d X 4 cycles / Pembrolizumab  (200) D1 + AC D1 q21d x 4 cycles     01/04/2023 - 06/21/2023 Chemotherapy   Patient is on Treatment Plan : BREAST Pembrolizumab  (200) q21d x 27 weeks      HISTORY OF PRESENTING ILLNESS: Ambulating independently.  Accompanied by husband.  Morgan Peterson 74 y.o.  female patient with  recurrence/stage IV disease-breast cancer ER positive is here for follow-up  C/o being tired and weak and dry mouth since 2nd week of kisqali . Having nausea after taking rx. She is asking if the dosage can be lowered?   THORACENTESIS 12/26/24   Discussed the use of AI scribe software for clinical note transcription with the patient, who gave verbal consent to proceed.  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  Discussed the use of AI scribe software for clinical note transcription with the patient, who gave verbal consent to proceed.  History of Present Illness   Morgan Peterson is a 74 year old female with recurrent ER-positive right breast cancer and malignant pleural effusion who presents for follow-up to assess treatment tolerance and side effects while on letrozole  and Kisqali .  She initiated oral letrozole  and Kisqali  two weeks ago and has completed her second week of therapy. She has not experienced new or worsening dyspnea. She continues to maintain activity.  Since starting Kisqali , she has  developed significant nausea and anorexia, describing an inability to tolerate food after dosing. She experiences dry heaves without emesis and uses Compazine  for nausea, which provides relief but causes marked somnolence. She has not used Zofran . She notes a thick taste in her mouth preceding nausea and attempts to eat crackers and cottage cheese, but oral intake remains limited. She has adjusted Kisqali  dosing to 11 AM to facilitate fluid intake prior to administration.  She reports persistent xerostomia and cheilitis, with occasional pharyngitis that improves with ice chips. She has difficulty maintaining adequate oral hydration and primarily consumes ice chips. She denies oral mucositis. She has experienced some loose stools, which she attributes to dietary changes, but denies diarrhea. Bowel movements are otherwise normal, with a background of irritable bowel syndrome with constipation; she describes the recent loose stools as a positive change.  She endorses fatigue, which she attributes to Kisqali  and Compazine . She remains active. Current medications include letrozole , Kisqali , vitamin D , spironolactone , Crestor , and Compazine . She identifies Compazine  as contributing to xerostomia, which worsens after use. She denies fever, chills, abnormal bleeding, or bruising.         Review of Systems  Constitutional:  Positive for malaise/fatigue. Negative for chills, diaphoresis, fever and weight loss.  HENT:  Negative for nosebleeds and sore throat.   Eyes:  Negative for double vision.  Respiratory:  Negative for cough, hemoptysis, sputum production,  shortness of breath and wheezing.   Cardiovascular:  Negative for chest pain, palpitations, orthopnea and leg swelling.  Gastrointestinal:  Negative for abdominal pain, blood in stool, constipation, diarrhea, heartburn, melena, nausea and vomiting.  Genitourinary:  Negative for dysuria, frequency and urgency.  Musculoskeletal:  Negative for back pain and  joint pain.  Skin: Negative.  Negative for itching and rash.  Neurological:  Positive for tingling. Negative for dizziness, focal weakness, weakness and headaches.  Endo/Heme/Allergies:  Does not bruise/bleed easily.  Psychiatric/Behavioral:  Negative for depression. The patient is not nervous/anxious and does not have insomnia.      MEDICAL HISTORY:  Past Medical History:  Diagnosis Date   Appetite loss    Asthma    Cervical radiculopathy    Chemotherapy-induced nausea    Hiatal hernia    Hypercholesterolemia    Hyperlipidemia    Hypertension    IBS (irritable bowel syndrome)    Malignant neoplasm of upper-outer quadrant of female breast (HCC) 04/11/2009   Right breast, invasive ductal carcinoma, 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 Arimidex therapy completed September 2015   Mild mitral valve prolapse    Neuropathy    feet, from chemo   Personal history of malignant neoplasm of breast 2010   Personal history of radiation therapy 2010   mammosite   T2DM (type 2 diabetes mellitus) (HCC) 2011    SURGICAL HISTORY: Past Surgical History:  Procedure Laterality Date   ABDOMINAL EXPLORATION SURGERY  2000   ovarian cyst   BREAST BIOPSY Right 2010   +    BREAST BIOPSY Left 04/06/2023   Left Breast Stereo Bx X Clip - path pending   BREAST BIOPSY Left 04/06/2023   Left Breast Stereo Bx, Coil Clip - path pending   BREAST BIOPSY Left 04/06/2023   MM LT BREAST BX W LOC DEV 1ST LESION IMAGE BX SPEC STEREO GUIDE 04/06/2023 [facility]   BREAST BIOPSY Left 04/06/2023   MM LT BREAST BX W LOC DEV EA AD LESION IMG BX SPEC STEREO GUIDE 04/06/2023 [facility]   BREAST EXCISIONAL BIOPSY Right 2010   + mammo site inasive mammo ca   BREAST LUMPECTOMY Right 2010   [facility]   COLONOSCOPY  07/19/2012   Normal exam, Dr. Dessa   COLONOSCOPY WITH PROPOFOL  N/A 04/21/2022   Procedure: COLONOSCOPY WITH PROPOFOL ;  Surgeon: Dessa Reyes ORN, MD;  Location: [facility];  Service: Endoscopy;  Laterality: N/A;   ESOPHAGOGASTRODUODENOSCOPY (EGD) WITH PROPOFOL  N/A 10/22/2022   Procedure: ESOPHAGOGASTRODUODENOSCOPY (EGD) WITH PROPOFOL ;  Surgeon: Jinny Carmine, MD;  Location[facility];  Service: Endoscopy;  Laterality: N/A;  Diabetic   MASTECTOMY Right 2010   partial/ lumpectomy   PORTACATH PLACEMENT Left 05/19/2022   Procedure: INSERTION PORT-A-CATH;  Surgeon: Dessa Reyes ORN, MD;  Location: [facility];  Service: General;  Laterality: Left;   SIMPLE MASTECTOMY WITH AXILLARY SENTINEL NODE BIOPSY Right 04/26/2022   Procedure: SIMPLE MASTECTOMY WITH AXILLARY SENTINEL NODE BIOPSY;  Surgeon: Dessa Reyes ORN, MD;  Location: [facility];  Service: General;  Laterality: Right;  RNFA to assist   TUBAL LIGATION  20 years ago    SOCIAL HISTORY: Social History   Socioeconomic History   Marital status: Married    Spouse name: Antonio   Number of children: 2   Years of education: Not on file   Highest education level: Not on file  Occupational History   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  Tobacco Use   Smoking status: Former    Current packs/day: 0.00    Average packs/day: 0.3 packs/day for 10.0 years (2.5 ttl pk-yrs)    Types: Cigarettes    Start date: 41    Quit date: 80    Years since quitting: 38.0   Smokeless tobacco: Never   Tobacco comments:    smoking cessation materials not required  Vaping Use   Vaping status: Never Used  Substance and Sexual Activity   Alcohol use: Yes    Comment: wine occ   Drug use: No   Sexual activity: Not Currently    Comment: husband has ED  Other Topics Concern   Not on file  Social History Narrative   Oncology nurse on the floor retired.;  Husband retired from [employer].  No smoking.   Social Drivers of Health   Tobacco Use: Medium Risk (01/01/2025)   Patient History    Smoking Tobacco Use: Former    Smokeless Tobacco Use: Never    Passive Exposure: Not  on file  Financial Resource Strain: Low Risk (04/12/2024)   Overall Financial Resource Strain (CARDIA)    Difficulty of Paying Living Expenses: Not hard at all  Food Insecurity: No Food Insecurity (11/13/2024)   Epic    Worried About Programme Researcher, Broadcasting/film/video in the Last Year: Never true    Ran Out of Food in the Last Year: Never true  Transportation Needs: No Transportation Needs (11/13/2024)   Epic    Lack of Transportation (Medical): No    Lack of Transportation (Non-Medical): No  Physical Activity: Sufficiently Active (04/12/2024)   Exercise Vital Sign    Days of Exercise per Week: 3 days    Minutes of Exercise per Session: 60 min  Stress: No Stress Concern Present (04/12/2024)   [employer] of Occupational Health - Occupational Stress Questionnaire    Feeling of Stress : Not at all  Social Connections: Unknown (11/13/2024)   Social Connection and Isolation Panel    Frequency of Communication with Friends and Family: More than three times a week    Frequency of Social Gatherings with Friends and Family: Twice a week    Attends Religious Services: Not on Insurance Claims Handler of Clubs or Organizations: No    Attends Banker Meetings: Never    Marital Status: Married  Catering Manager Violence: Not At Risk (11/13/2024)   Epic    Fear of Current or Ex-Partner: No    Emotionally Abused: No    Physically Abused: No    Sexually Abused: No  Depression (PHQ2-9): Low Risk (01/01/2025)   Depression (PHQ2-9)    PHQ-2 Score: 0  Alcohol Screen: Low Risk (04/12/2024)   Alcohol Screen    Last Alcohol Screening Score (AUDIT): 0  Housing: Low Risk (11/13/2024)   Epic    Unable to Pay for Housing in the Last Year: No    Number of Times Moved in the Last Year: 0    Homeless in the Last Year: No  Utilities: Not At Risk (11/13/2024)   Epic    Threatened with loss of utilities: No  Health Literacy: Adequate Health Literacy (04/12/2024)   B1300 Health Literacy    Frequency of  need for help with medical instructions: Never    FAMILY HISTORY: Family History  Problem Relation Age of Onset   Osteoporosis Mother    Diabetes Father    Kidney disease Father    Diabetes Brother  Breast cancer Maternal Aunt    Bladder Cancer Maternal Aunt    Cervical cancer Maternal Aunt    Colon cancer Maternal Uncle    Lung cancer Maternal Uncle     ALLERGIES:  is allergic to levaquin [levofloxacin in d5w], losartan, metoprolol, saxagliptin, amlodipine-olmesartan, canagliflozin, egg white [egg protein (egg white)], gabapentin , glipizide, gluten meal, lisinopril, metformin  and related, milk (cow), milk-related compounds, shrimp extract, tramadol, zyprexa  [olanzapine ], actos [pioglitazone], atorvastatin , carvedilol , and prednisone .  MEDICATIONS:  Current Outpatient Medications  Medication Sig Dispense Refill   aspirin  EC 81 MG tablet Take 1 tablet (81 mg total) by mouth daily. 30 tablet 0   atenolol  (TENORMIN ) 25 MG tablet Take 12.5 mg by mouth daily.     Azelastine  HCl 137 MCG/SPRAY SOLN PLACE 1 SPRAY INTO BOTH NOSTRILS 2 (TWO) TIMES DAILY. USE IN EACH NOSTRIL AS DIRECTED 90 mL 0   benzonatate  (TESSALON ) 100 MG capsule Take 1 capsule (100 mg total) by mouth 3 (three) times daily as needed for cough. 60 capsule 0   betamethasone dipropionate 0.05 % lotion Apply 1 Application topically daily.     betamethasone, augmented, (DIPROLENE) 0.05 % lotion Apply 1 application  topically 2 (two) times daily.     Blood Glucose Monitoring Suppl (ONETOUCH VERIO) w/Device KIT 1 Device by Does not apply route once. 1 kit 0   Cyanocobalamin (VITAMIN B-12 PO) Take 1 tablet by mouth daily.     diazepam  (VALIUM ) 2 MG tablet One pill 45-60 mins prior to procedure; 1 pill 15 mins prior if needed. 3 tablet 0   EPINEPHrine  (EPIPEN  2-PAK) 0.3 mg/0.3 mL IJ SOAJ injection Inject 0.3 mg into the muscle as needed for anaphylaxis. 2 each 0   fexofenadine (ALLEGRA) 180 MG tablet Take 180 mg by mouth daily.      glucose blood (ONETOUCH VERIO) test strip Use as instructed 100 each 12   ketoconazole (NIZORAL) 2 % shampoo Apply 1 Application topically 3 (three) times a week.     Lancets (ONETOUCH ULTRASOFT) lancets Use as instructed 100 each 3   letrozole  (FEMARA ) 2.5 MG tablet Take 1 tablet (2.5 mg total) by mouth daily. 90 tablet 1   levocetirizine (XYZAL ) 5 MG tablet TAKE 1 TABLET (5 MG TOTAL) BY MOUTH DAILY. 90 tablet 0   minoxidil  (LONITEN ) 2.5 MG tablet Take 2.5 mg by mouth daily.     montelukast  (SINGULAIR ) 10 MG tablet Take 1 tablet (10 mg total) by mouth at bedtime. 90 tablet 1   Multiple Vitamin (MULTIVITAMIN WITH MINERALS) TABS tablet Take 1 tablet by mouth 2 (two) times a week.     NON FORMULARY Soursop Tea     NON FORMULARY Dandelion tea     NON FORMULARY Turmeric cinnamon ginger tea     omeprazole  (PRILOSEC) 20 MG capsule Take 1 capsule (20 mg total) by mouth daily. 90 capsule 1   ondansetron  (ZOFRAN ) 8 MG tablet Take 1 tablet (8 mg total) by mouth every 8 (eight) hours as needed for nausea or vomiting. 90 tablet 0   OZEMPIC, 0.25 OR 0.5 MG/DOSE, 2 MG/1.5ML SOPN Inject 0.5 mg into the skin every Sunday.     OZEMPIC, 0.25 OR 0.5 MG/DOSE, 2 MG/3ML SOPN Inject 0.5 mg into the skin every 7 (seven) days.     polyethylene glycol (MIRALAX  / GLYCOLAX ) 17 g packet Take 17 g by mouth daily.     prednisoLONE acetate (PRED FORTE) 1 % ophthalmic suspension Place 1 drop into the left eye.  prochlorperazine  (COMPAZINE ) 10 MG tablet Take 1 tablet (10 mg total) by mouth every 6 (six) hours as needed for nausea or vomiting. 30 tablet 2   ribociclib  succ (KISQALI  600MG  DAILY DOSE) 200 MG Therapy Pack Take 3 tablets (600 mg total) by mouth daily. Take for 21 days on, 7 days off, repeat every 28 days. 63 tablet 1   rosuvastatin  (CRESTOR ) 20 MG tablet Take 1 tablet (20 mg total) by mouth every morning. 90 tablet 1   spironolactone  (ALDACTONE ) 25 MG tablet Take 1 tablet (25 mg total) by mouth daily. 90 tablet  1   Vitamin D , Ergocalciferol , (DRISDOL ) 1.25 MG (50000 UNIT) CAPS capsule Take 50,000 Units by mouth once a week.     No current facility-administered medications for this visit.   Facility-Administered Medications Ordered in Other Visits  Medication Dose Route Frequency Provider Last Rate Last Admin   heparin  lock flush 100 UNIT/ML injection            heparin  lock flush 100 unit/mL  500 Units Intravenous Once Kristian Hazzard R, MD          .  PHYSICAL EXAMINATION: ECOG PERFORMANCE STATUS: 0 - Asymptomatic  Vitals:   01/01/25 1314  BP: 138/81  Pulse: 97  Resp: 16  Temp: 98.5 F (36.9 C)  SpO2: 100%       Filed Weights   01/01/25 1314  Weight: 163 lb 3.2 oz (74 kg)     Decreased breath sounds on the left compared to right.   Physical Exam Vitals and nursing note reviewed.  HENT:     Head: Normocephalic and atraumatic.     Mouth/Throat:     Pharynx: Oropharynx is clear.  Eyes:     Extraocular Movements: Extraocular movements intact.     Pupils: Pupils are equal, round, and reactive to light.  Cardiovascular:     Rate and Rhythm: Normal rate and regular rhythm.  Abdominal:     Palpations: Abdomen is soft.  Musculoskeletal:        General: Normal range of motion.     Cervical back: Normal range of motion.  Skin:    General: Skin is warm.  Neurological:     General: No focal deficit present.     Mental Status: She is alert and oriented to person, place, and time.  Psychiatric:        Behavior: Behavior normal.        Judgment: Judgment normal.      LABORATORY DATA:  I have reviewed the data as listed Lab Results  Component Value Date   WBC 2.5 (L) 01/01/2025   HGB 11.8 (L) 01/01/2025   HCT 37.5 01/01/2025   MCV 92.6 01/01/2025   PLT 170 01/01/2025   Recent Labs    11/12/24 1539 11/13/24 0432 11/19/24 1149 01/01/25 1317  NA 139 136 139 138  K 4.0 4.3 4.2 4.9  CL 101 101 102 102  CO2 25 21* 24 24  GLUCOSE 116* 83 102* 134*  BUN 6*  7* 9 13  CREATININE 0.83 0.68 0.96 1.10*  CALCIUM  9.6 9.3 9.4 9.8  GFRNONAA >60 >60 >60 53*  PROT 7.3  --  7.5 7.2  ALBUMIN 4.0  --  3.5 3.8  AST 22  --  23 23  ALT 8  --  13 15  ALKPHOS 95  --  75 99  BILITOT 0.4  --  0.8 0.5    RADIOGRAPHIC STUDIES: I have personally reviewed the radiological images  as listed and agreed with the findings in the report. US  THORACENTESIS ASP PLEURAL SPACE W/IMG GUIDE Result Date: 12/26/2024 INDICATION: 74 year old female with a history of right-sided breast cancer and recently recurring left-sided pleural effusions. Recent IR biopsy for left pleural thickening returned positive for metastatic adenocarcinoma. Request for therapeutic thoracentesis. EXAM: ULTRASOUND GUIDED LEFT-SIDED, THERAPEUTIC THORACENTESIS MEDICATIONS: 1% lidocaine , 10 mL COMPLICATIONS: None immediate. PROCEDURE: An ultrasound guided thoracentesis was thoroughly discussed with the patient and questions answered. The benefits, risks, alternatives and complications were also discussed. The patient understands and wishes to proceed with the procedure. Written consent was obtained. Ultrasound was performed to localize and mark an adequate pocket of fluid in the left chest. The area was then prepped and draped in the normal sterile fashion. 1% Lidocaine  was used for local anesthesia. Under ultrasound guidance a 6 Fr Safe-T-Centesis catheter was introduced. Thoracentesis was performed. The catheter was removed and a dressing applied. FINDINGS: A total of approximately 1.4 L of bloody fluid was removed. IMPRESSION: Successful ultrasound guided left-sided thoracentesis yielding 1.4 L of pleural fluid. Procedure performed by: Sherrilee Bal, PA-C under the supervision of Dr. VEAR Lent. Electronically Signed   By: Wilkie Lent M.D.   On: 12/26/2024 13:06   DG Chest Port 1 View Result Date: 12/26/2024 EXAM: 1 VIEW(S) XRAY OF THE CHEST 12/26/2024 11:43:00 AM COMPARISON: 11/28/2024 CLINICAL HISTORY:  74 year old female, status post ultrasound-guided left thoracentesis this morning. FINDINGS: LUNGS AND PLEURA: Moderate left pleural effusion with left basilar atelectasis and airspace opacity, unchanged. Trace right pleural effusion, unchanged. No pneumothorax. HEART AND MEDIASTINUM: Large chronic gastric hiatal hernia, contains gas now. No acute abnormality of the cardiac and mediastinal silhouettes. BONES AND SOFT TISSUES: No acute osseous abnormality. IMPRESSION: 1. No pneumothorax following left side thoracentesis. Stable ventilation. 2. Large chronic hiatal hernia. Electronically signed by: Helayne Hurst MD 12/26/2024 11:57 AM EST RP Workstation: [id number]   CT LUNG MASS BIOPSY Result Date: 12/05/2024 INDICATION: FDG avid pleural thickening on the left side. EXAM: CT-guided biopsy TECHNIQUE: Multidetector CT imaging of the chest was performed following the standard protocol without IV contrast. RADIATION DOSE REDUCTION: This exam was performed according to the departmental dose-optimization program which includes automated exposure control, adjustment of the mA and/or kV according to patient size and/or use of iterative reconstruction technique. MEDICATIONS: None. ANESTHESIA/SEDATION: Moderate (conscious) sedation was employed during this procedure. A total of Versed  1 mg and Fentanyl  50 mcg was administered intravenously by the radiology nurse. Total intra-service moderate Sedation Time: 12 minutes. The patient's level of consciousness and vital signs were monitored continuously by radiology nursing throughout the procedure under my direct supervision. COMPLICATIONS: None immediate. PROCEDURE: Informed written consent was obtained from the patient after a thorough discussion of the procedural risks, benefits and alternatives. All questions were addressed. Maximal Sterile Barrier Technique was utilized including caps, mask, sterile gowns, sterile gloves, sterile drape, hand hygiene and skin antiseptic. A  timeout was performed prior to the initiation of the procedure. With patient in a prone position and radiopaque markers on the left posterior chest wall, axial images of the chest were obtained. The anatomy was reviewed and measurements obtained of the likely target. The patient's skin was then marked. The patient was then prepped and draped in usual sterile fashion. Local anesthesia was achieved with 1% lidocaine . 17 gauge needle was then advanced through a small incision in the posterior wall directed towards the pleural thickening. Repeat imaging was performed to redirect the needle and advanced the tip of the needle  to the target. When the needle was in position the stylet was removed. The BioPince 18 gauge biopsy needle was then advanced through the introducer and multiple samples were obtained of the thickened pleura. Cores were sent to pathology for further evaluation. All needles removed from the patient. Repeat imaging demonstrates no acute hemorrhage or pneumothorax. IMPRESSION: Satisfactory core needle biopsy of left pleural thickening. 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  Recommend PET scan ASAP.  Also discussed regarding possible need for a biopsy of the right axillary lymph node based upon the results of PET scan.Pleura, biopsy:  METASTATIC ADENOCARCINOMA, CONSISTENT WITH PATIENT'S CLINICAL HISTORY OF       PRIMARY BREAST CARCINOMA,  The tumor cells are  NEGATIVE for Her2 (0). Estrogen Receptor:  100%, POSITIVE, Progesterone Receptor:  80%, POSITIVE Proliferation Marker Ki67:  20%. DEC 2025- PET scan: Extensive hypermetabolic nodularity throughout the left pleural space consistent with known pleural metastatic disease. Persistent large left pleural effusion with near complete collapse of the left lung and mediastinal shift to the right. Hypermetabolic mediastinal, left hilar and left internal mammary lymphadenopathy consistent with metastatic disease. No suspicious axillary nodal involvement.  Previously demonstrated right pleural and pulmonary nodularity is not associated with significant hypermetabolic activity, although is suboptimally evaluated due to small size. Scattered small sclerotic lesions within the lumbar spine and bony pelvis with low level metabolic activity, suspicious for metastatic disease. No evidence of extra osseous metastatic disease in the abdomen or pelvis.  # Currently on letrozole ; along with Kisqali - tolerating well except for nasuea/and dry mouth.  2-weeks EKG done today.  Labs-unremarkable.  # Nausea- G-2- recommend prophylactic Zofran - new script sent-   # Dry mouth drowsiness-likely anticholinergic side effects of Compazine .  Switch over to Zofran .  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  Monitor blood sugars closely. Stable  # Bone health- April 2024- BMD measured at Femur Neck Right is 0.879 g/cm2 with a T-score of -1.1.Stable   # IV access- s/p  Explantation-PIV.   # DISPOSITION: # EKG today # Follow up in  in 2   weeks- MD; labs- cbc/cmp; EKG-; ca 27-29; Ca 15-3-Dr.B    All questions were answered. The patient/family knows to call the clinic with any problems, questions or concerns.    Cindy JONELLE Joe, MD 01/01/2025 2:38 PM

## 2025-01-02 ENCOUNTER — Telehealth: Payer: Self-pay | Admitting: *Deleted

## 2025-01-02 NOTE — Telephone Encounter (Signed)
 Caller verified using pt's full name and dob prior to discussing PHI   Patient called to called to verify that her zofran  rx was sent to pharmacy yesterday. She has not rcvd any notification from her pharmacy. I reviewed [facility] [place] [facility] yesterday at 202 pm. Pt informed. She will call [facility] to confirm.

## 2025-01-02 NOTE — Telephone Encounter (Signed)
 Patient called back to clinic. She is not able to get her zofran . Stated that the medication needed a PA.  Patty, are you able to assist with this?

## 2025-01-03 ENCOUNTER — Other Ambulatory Visit (HOSPITAL_COMMUNITY): Payer: Self-pay

## 2025-01-03 ENCOUNTER — Telehealth: Payer: Self-pay | Admitting: Pharmacy Technician

## 2025-01-03 NOTE — Telephone Encounter (Signed)
 Oral Oncology Patient Advocate Encounter   New authorization  Member ID: [id number]   Received notification that prior authorization for ondansetron  is required.   PA submitted on CMM via Latent Key [id number] Status is pending     Morgan Peterson (Morgan Peterson) Chet Burnet, CPhT  [facility], [place], [place] [facility] - Oral Chemotherapy Patient Advocate Specialist III Phone: [phone]  Fax: [phone]

## 2025-01-04 ENCOUNTER — Encounter: Payer: Self-pay | Admitting: Internal Medicine

## 2025-01-04 NOTE — Telephone Encounter (Signed)
 Oral Oncology Patient Advocate Encounter  Prior Authorization for ondansetron  has been approved.    PA# [id number] Effective dates: 01/03/2025 through 12/19/2025  Barbette (Patty) Chet Burnet, CPhT  [facility] [place], [place], [place] Hematology/Oncology - Oral Chemotherapy Patient Advocate Specialist III Phone: [phone]  Fax: [phone]

## 2025-01-04 NOTE — Telephone Encounter (Signed)
(  KeyBETHA KERN) Rx #: [id number] Need Help? Call us  at [phone] Outcome Prior Authorization duplicate/approved Drug Ondansetron  HCl 8MG  tablets

## 2025-01-07 ENCOUNTER — Other Ambulatory Visit (HOSPITAL_COMMUNITY): Payer: Self-pay

## 2025-01-08 ENCOUNTER — Other Ambulatory Visit: Payer: Self-pay

## 2025-01-08 ENCOUNTER — Encounter: Payer: Self-pay | Admitting: Internal Medicine

## 2025-01-08 NOTE — Progress Notes (Signed)
 Specialty Pharmacy Refill Coordination Note  Morgan Peterson is a 74 y.o. female contacted today regarding refills of specialty medication(s) Ribociclib  Succinate (KISQALI  600mg  Daily Dose)   Patient requested Delivery   Delivery date: 01/11/25   Verified address: [address] [place] [address]   Medication will be filled on: 01/10/25

## 2025-01-08 NOTE — Progress Notes (Signed)
 Specialty Pharmacy Ongoing Clinical Assessment Note  Morgan Peterson is a 74 y.o. female who is being followed by the specialty pharmacy service for RxSp Oncology   Patient's specialty medication(s) reviewed today: Ribociclib  Succinate (KISQALI  600mg  Daily Dose)   Missed doses in the last 4 weeks: 0   Patient/Caregiver did not have any additional questions or concerns.   Therapeutic benefit summary: Unable to assess   Adverse events/side effects summary: Experienced adverse events/side effects (nausea, tolerable with Zofran )   Patient's therapy is appropriate to: Continue    Goals Addressed             This Visit's Progress    Slow Disease Progression   No change    Patient is initiating therapy. Patient will maintain adherence         Follow up: 3 months  Silvano LOISE Dolly Specialty Pharmacist

## 2025-01-10 ENCOUNTER — Other Ambulatory Visit: Payer: Self-pay

## 2025-01-11 ENCOUNTER — Other Ambulatory Visit: Payer: Self-pay

## 2025-01-11 ENCOUNTER — Telehealth: Payer: Self-pay | Admitting: Internal Medicine

## 2025-01-11 NOTE — Telephone Encounter (Signed)
 Due to weather and possible road conditions, pt r/s appts from 1/28 to next available on 2/3

## 2025-01-11 NOTE — Progress Notes (Signed)
 Pharmacy Quality Measure Review  This patient is appearing on a report for being at risk of failing the adherence measure for diabetes medications this calendar year.   Medication: Ozempic 0.25-0.5 mg Last fill date: 12/21/24 for 28 day supply  Insurance report was not up to date. No action needed at this time.    Prentice DOROTHA Favors, PharmD PGY1 Health-System Pharmacy Administration and Leadership Resident [facility]  01/11/2025 9:56 AM

## 2025-01-16 ENCOUNTER — Inpatient Hospital Stay

## 2025-01-16 ENCOUNTER — Inpatient Hospital Stay: Admitting: Internal Medicine

## 2025-01-18 ENCOUNTER — Telehealth: Payer: Self-pay | Admitting: *Deleted

## 2025-01-18 DIAGNOSIS — Z171 Estrogen receptor negative status [ER-]: Secondary | ICD-10-CM

## 2025-01-18 NOTE — Telephone Encounter (Signed)
 "  Caller verified using pt's full name and dob prior to discussing PHI       NURSING SYMPTOM TRIAGE ASSESSMENT & DISPOSITION  Mode of interaction:  Telephone Date of symptom triage interaction:  01/18/25 Time of symptom triage interaction:  1450  Cancer diagnosis:  Carcinoma of upper-outer quadrant of right breast in female, estrogen receptor negative (HCC)  Oral chemotherapy:  Yes, prescription for Kisqali  with last dose taken today at 11.  Symptom Triage Protocol:  Pruritus  History of the problem:  Pruritus Localized or generalized pruritus?  Localized.  If localized, where:  abdomen and mid back   Skin changes?   No  How does pruritus affect ADLs?  no  Pruritus grading: 1 = Mild or localized; topical intervention indicated 2 = Widespread and intermittent; skin changes from scratching (e.g., edema, papulation, excoriations, lichenification, oozing/crusts); oral intervention indicated; limiting instrumental ADLs 3 = Widespread and constant; limiting self-care ADLs or sleep; systemic corticosteroid or immunosuppressive therapy indicated   Grade:  1-localized on her back minor mid back and lower abdomen no signs of rash; pt trying not to scratch.  Precipitating factors:  *patient educated on commons causes of itching. She has not used any new fragrances, new soaps or detergents  Home management tips for itching provided to patient.  Onset:  Yesterday at  bedtime  Duration:    Relieving factors: Aveeno  hydrocortisone creams; using unscented dove.  Does applying heat or taking hot showers make it worse?  No  Are there associated signs or symptoms?  no  Changes in ADLs: none     Additional commentary: No rash, no swelling of face, lips or throat, no fever     Triage Nurse Guidance Signs and Symptoms Action  Difficulty breathing Chest tightness Sense of overwhelming anxiety or doom Generalized body rash with wheals or hives (with or without itch) Seek emergency care.   Call an ambulance immediately.  Generalized rash without shortness of breath Spreading localized rash with or without itch Fever Pustules or lesions with exudate Pustules along a nerve tract Bleeding Jaundice Pain Introduction of new medication in the past 24 hours Localized rash with or without itch Scaling Cracking skin Scratch marks or breaks in skin Inflammation Scabies or lice White or red patches New exposures, such as animal, plant, or chemical New onset itch without other symptoms Seek urgent care within two (2) hours.   Provider Consulted  Provider name and credentials: Msg sent to Dr. Rennie and [facility]  Provider instruction:      Patient Instruction  Disclaimer:  Patient specific and Elsevier instruction provided verbally and sent through MyChart for active users.    Increase fluid intake to improve skin hydration.  Use mild soaps or soaps made for sensitive skin. Oatmeal baths or soap may provide relief. Avoid perfumed soaps or bubble baths.  Bathe only once a day in lukewarm or cool water . Limit baths to 30 minutes. Avoid long, hot showers. Apply skin emollients or lotions to bath water  or to skin immediately after bathing while skin is still damp and then one or two times throughout the day. Do not use baby powder. Avoid lotions containing alcohol. Lotions and emollients recommended for sen-sitive skin include Eucerin, Aquaphor , Alpha Keri , Lubriderm , or Nivea . Avoid tight, irritating clothing. Wear loose, soft, cotton garments.  Use mild laundry detergents, such as those designed for infants, when washing clothing and bed linens. Maintain a humid environment through the use of a humidifier.  Protect skin from the sun with sunscreen (SPF 30 or greater) applied each morn-ing. Wear protective clothing such as long-sleeved shirts and broad-brimmed hats at times of direct sun exposure. Try application of a cool washcloth or ice over the site of itching.   Try rubbing, pressure, or vibration to provide relief.  For prescriptions or other interventions to assist with patient comfort, consult with the healthcare team, as appropriate. If it is determined that the patient has targeted therapy-induced rash with pruritus, refer the patient to instructions from the healthcare provider.  Report the Following Problems: Symptoms not responding to homecare interventions, such as itch that continues for more than 48 hours after measures have been implemented Development of a rash, scaling, cracking, bleeding, redness, white patches, or blistering Temperature higher than 100.47F (38C) if neutropenia is not suspected, as these symptoms require further evaluation and intervention  Seek Emergency Care Immediately if Any of the Following Occurs: Chest tightness  Difficulty breathing  Temperature higher than 100.47F (38C) if neutropenia is suspected  Generalized body rash with wheals or hives  Teach Back Method used:  Yes    Nurse Triage Priority:  Non-urgent (review and reinforce self-care strategies)  Barriers to Care:  None identified  Nurse Triage Disposition:  Home care, return call if no improvement within 24 hours- patient will continue to use otc recommendations and will call back if symptoms worsen or if rash occurs.  Protocol Source:  Ruthine HERO., & Eldonna CANDIE Pee.). (2019). Telephone triage for oncology nurses (3rd ed.). Oncology Nursing Society.  "

## 2025-01-22 ENCOUNTER — Inpatient Hospital Stay: Attending: Internal Medicine

## 2025-01-22 ENCOUNTER — Inpatient Hospital Stay: Admitting: Internal Medicine

## 2025-01-22 VITALS — BP 110/67 | HR 90 | Temp 97.4°F | Resp 16 | Wt 155.8 lb

## 2025-01-22 DIAGNOSIS — Z171 Estrogen receptor negative status [ER-]: Secondary | ICD-10-CM

## 2025-01-22 DIAGNOSIS — C50411 Malignant neoplasm of upper-outer quadrant of right female breast: Secondary | ICD-10-CM

## 2025-01-22 LAB — CMP (CANCER CENTER ONLY)
ALT: 23 U/L (ref 0–44)
AST: 27 U/L (ref 15–41)
Albumin: 3.6 g/dL (ref 3.5–5.0)
Alkaline Phosphatase: 98 U/L (ref 38–126)
Anion gap: 14 (ref 5–15)
BUN: 17 mg/dL (ref 8–23)
CO2: 24 mmol/L (ref 22–32)
Calcium: 9.4 mg/dL (ref 8.9–10.3)
Chloride: 101 mmol/L (ref 98–111)
Creatinine: 1.51 mg/dL — ABNORMAL HIGH (ref 0.44–1.00)
GFR, Estimated: 36 mL/min — ABNORMAL LOW
Glucose, Bld: 180 mg/dL — ABNORMAL HIGH (ref 70–99)
Potassium: 3.9 mmol/L (ref 3.5–5.1)
Sodium: 138 mmol/L (ref 135–145)
Total Bilirubin: 0.4 mg/dL (ref 0.0–1.2)
Total Protein: 7.2 g/dL (ref 6.5–8.1)

## 2025-01-22 LAB — CBC WITH DIFFERENTIAL (CANCER CENTER ONLY)
Abs Immature Granulocytes: 0.02 10*3/uL (ref 0.00–0.07)
Basophils Absolute: 0 10*3/uL (ref 0.0–0.1)
Basophils Relative: 1 %
Eosinophils Absolute: 0 10*3/uL (ref 0.0–0.5)
Eosinophils Relative: 1 %
HCT: 33.4 % — ABNORMAL LOW (ref 36.0–46.0)
Hemoglobin: 10.6 g/dL — ABNORMAL LOW (ref 12.0–15.0)
Immature Granulocytes: 1 %
Lymphocytes Relative: 31 %
Lymphs Abs: 0.6 10*3/uL — ABNORMAL LOW (ref 0.7–4.0)
MCH: 29.8 pg (ref 26.0–34.0)
MCHC: 31.7 g/dL (ref 30.0–36.0)
MCV: 93.8 fL (ref 80.0–100.0)
Monocytes Absolute: 0.1 10*3/uL (ref 0.1–1.0)
Monocytes Relative: 3 %
Neutro Abs: 1.2 10*3/uL — ABNORMAL LOW (ref 1.7–7.7)
Neutrophils Relative %: 63 %
Platelet Count: 207 10*3/uL (ref 150–400)
RBC: 3.56 MIL/uL — ABNORMAL LOW (ref 3.87–5.11)
RDW: 15.2 % (ref 11.5–15.5)
Smear Review: NORMAL
WBC Count: 1.8 10*3/uL — ABNORMAL LOW (ref 4.0–10.5)
nRBC: 0 % (ref 0.0–0.2)

## 2025-01-22 MED ORDER — TRIAMCINOLONE ACETONIDE 0.5 % EX OINT: 1.0000 | TOPICAL_OINTMENT | Freq: Two times a day (BID) | CUTANEOUS | 0 refills | Status: AC

## 2025-01-22 MED ORDER — RIBOCICLIB SUCC (600 MG DOSE) 200 MG PO TBPK: ORAL_TABLET | ORAL | 1 refills | Status: AC

## 2025-01-22 NOTE — Addendum Note (Signed)
 Addended by: LAEL BROWNING A on: 01/22/2025 03:41 PM   Modules accepted: Orders

## 2025-01-22 NOTE — Assessment & Plan Note (Addendum)
#    STAGE IV- NOV 2025- CT chest-  Large left pleural effusion with pleural nodularity, consistent with pleural metastasis; Tiny right pleural effusion with findings suspicious for right pleural and pulmonary parenchymal metastases including nodularity along the right major fissure and scattered right lung nodules up to 4 mm; Right axillary nodal metastasis in the setting of prior right mastectomy.  Recommend PET scan ASAP.  Also discussed regarding possible need for a biopsy of the right axillary lymph node based upon the results of PET scan.Pleura, biopsy:  METASTATIC ADENOCARCINOMA, CONSISTENT WITH PATIENT'S CLINICAL HISTORY OF       PRIMARY BREAST CARCINOMA,  The tumor cells are NEGATIVE for Her2 (0). Estrogen Receptor:  100%, POSITIVE, Progesterone Receptor:  80%, POSITIVE Proliferation Marker Ki67:  20%. DEC 2025- PET scan: Extensive hypermetabolic nodularity throughout the left pleural space consistent with known pleural metastatic disease. Persistent large left pleural effusion with near complete collapse of the left lung and mediastinal shift to the right. Hypermetabolic mediastinal, left hilar and left internal mammary lymphadenopathy consistent with metastatic disease. No suspicious axillary nodal involvement.  Previously demonstrated right pleural and pulmonary nodularity is not associated with significant hypermetabolic activity, although is suboptimally evaluated due to small size. Scattered small sclerotic lesions within the lumbar spine and bony pelvis with low level metabolic activity, suspicious for metastatic disease. No evidence of extra osseous metastatic disease in the abdomen or pelvis.  # Currently on letrozole ; along with Kisqali - tolerating well except for continued weight loss/skin itch [see below]  2-weeks EKG done today.  Labs-ANC 1.2- so recommend 400 mg day- [next 2 more weeks]; and then re-assess.  Discussed with pharmacy.  # Itch- no rash-unclear etiology.  Trial of kenalog   ointment. Prn.   # Elevated creatinine 1.5-suspect dehydration poor p.o. intake; discontinue spironolactone  as patient's blood pressure is well-controlled.  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   # Bone health- April 2024- BMD measured at Femur Neck Right is 0.879 g/cm2 with a T-score of -1.1.Stable   # IV access- s/p  Explantation-PIV.   # DISPOSITION: # EKG today # IVFs this week # Follow up in  in 2  weeks- MD; labs- cbc/cmp;  ca 27-29; Ca 15-3-Dr.B

## 2025-01-23 ENCOUNTER — Inpatient Hospital Stay

## 2025-01-23 DIAGNOSIS — C50411 Malignant neoplasm of upper-outer quadrant of right female breast: Secondary | ICD-10-CM

## 2025-01-23 LAB — CANCER ANTIGEN 15-3: CA 15-3: 319 U/mL — ABNORMAL HIGH (ref 0.0–25.0)

## 2025-01-23 LAB — CANCER ANTIGEN 27.29: CA 27.29: 386 U/mL — ABNORMAL HIGH (ref 0.0–38.6)

## 2025-01-23 MED ORDER — SODIUM CHLORIDE 0.9 % IV SOLN
Freq: Once | INTRAVENOUS | Status: AC
  Filled 2025-01-23: qty 250

## 2025-01-24 ENCOUNTER — Telehealth: Payer: Self-pay | Admitting: *Deleted

## 2025-01-24 NOTE — Telephone Encounter (Signed)
 Sent fax communication to nurse/CMA for Dr Jeanine endocrinology-that given her ongoing weight loss would recommend alternative to Ozempic for her diabetes.

## 2025-02-05 ENCOUNTER — Inpatient Hospital Stay: Admitting: Internal Medicine

## 2025-02-05 ENCOUNTER — Inpatient Hospital Stay

## 2025-03-13 ENCOUNTER — Ambulatory Visit: Admitting: Family Medicine

## 2025-03-18 ENCOUNTER — Ambulatory Visit

## 2025-03-18 ENCOUNTER — Other Ambulatory Visit

## 2025-03-18 ENCOUNTER — Ambulatory Visit: Admitting: Internal Medicine

## 2025-04-25 ENCOUNTER — Ambulatory Visit
